# Patient Record
Sex: Male | Born: 1950
Health system: Southern US, Community
[De-identification: ages and names within clinical notes are randomized; demographics above are authoritative.]

## PROBLEM LIST (undated history)

## (undated) DIAGNOSIS — E1165 Type 2 diabetes mellitus with hyperglycemia: Secondary | ICD-10-CM

## (undated) DIAGNOSIS — R238 Other skin changes: Secondary | ICD-10-CM

## (undated) DIAGNOSIS — I10 Essential (primary) hypertension: Secondary | ICD-10-CM

## (undated) DIAGNOSIS — IMO0002 Reserved for concepts with insufficient information to code with codable children: Secondary | ICD-10-CM

## (undated) DIAGNOSIS — N189 Chronic kidney disease, unspecified: Secondary | ICD-10-CM

## (undated) DIAGNOSIS — B89 Unspecified parasitic disease: Secondary | ICD-10-CM

## (undated) DIAGNOSIS — F17210 Nicotine dependence, cigarettes, uncomplicated: Secondary | ICD-10-CM

## (undated) HISTORY — PX: NO PAST SURGERIES: SHX2092

---

## 2008-06-25 ENCOUNTER — Ambulatory Visit: Payer: Self-pay | Admitting: Internal Medicine

## 2008-06-30 ENCOUNTER — Ambulatory Visit (HOSPITAL_COMMUNITY): Admission: RE | Admit: 2008-06-30 | Discharge: 2008-06-30 | Payer: Self-pay | Admitting: Family Medicine

## 2008-06-30 ENCOUNTER — Ambulatory Visit: Payer: Self-pay | Admitting: *Deleted

## 2008-07-19 ENCOUNTER — Ambulatory Visit: Payer: Self-pay | Admitting: Family Medicine

## 2008-07-19 LAB — CONVERTED CEMR LAB
Alkaline Phosphatase: 144 units/L — ABNORMAL HIGH (ref 39–117)
BUN: 9 mg/dL (ref 6–23)
Chloride: 100 meq/L (ref 96–112)
Cholesterol: 208 mg/dL — ABNORMAL HIGH (ref 0–200)
Eosinophils Absolute: 0.2 10*3/uL (ref 0.0–0.7)
Glucose, Bld: 208 mg/dL — ABNORMAL HIGH (ref 70–99)
HDL: 50 mg/dL (ref >39)
LDL Cholesterol: 137 mg/dL — ABNORMAL HIGH (ref 0–99)
MCV: 88.1 fL (ref 78.0–100.0)
Monocytes Absolute: 0.5 10*3/uL (ref 0.1–1.0)
Neutro Abs: 4.9 10*3/uL (ref 1.7–7.7)
Neutrophils Relative %: 56 % (ref 43–77)
Platelets: 216 10*3/uL (ref 150–400)
RBC: 5.3 M/uL (ref 4.22–5.81)
Total Bilirubin: 0.6 mg/dL (ref 0.3–1.2)
Total CHOL/HDL Ratio: 4.2
Triglycerides: 103 mg/dL (ref <150)
VLDL: 21 mg/dL (ref 0–40)
WBC: 8.6 10*3/uL (ref 4.0–10.5)

## 2008-08-05 ENCOUNTER — Ambulatory Visit (HOSPITAL_COMMUNITY): Admission: RE | Admit: 2008-08-05 | Discharge: 2008-08-05 | Payer: Self-pay | Admitting: Family Medicine

## 2012-04-13 ENCOUNTER — Emergency Department (HOSPITAL_COMMUNITY): Payer: Self-pay

## 2012-04-13 ENCOUNTER — Encounter (HOSPITAL_COMMUNITY): Payer: Self-pay | Admitting: Emergency Medicine

## 2012-04-13 ENCOUNTER — Emergency Department (HOSPITAL_COMMUNITY)
Admission: EM | Admit: 2012-04-13 | Discharge: 2012-04-13 | Disposition: A | Discharge status: Home / Self Care | Payer: Self-pay | Attending: Emergency Medicine | Admitting: Emergency Medicine

## 2012-04-13 DIAGNOSIS — M549 Dorsalgia, unspecified: Secondary | ICD-10-CM

## 2012-04-13 DIAGNOSIS — M545 Low back pain, unspecified: Secondary | ICD-10-CM | POA: Insufficient documentation

## 2012-04-13 DIAGNOSIS — M546 Pain in thoracic spine: Secondary | ICD-10-CM | POA: Insufficient documentation

## 2012-04-13 DIAGNOSIS — F17200 Nicotine dependence, unspecified, uncomplicated: Secondary | ICD-10-CM | POA: Insufficient documentation

## 2012-04-13 MED ORDER — HYDROCODONE-ACETAMINOPHEN 5-325 MG PO TABS
1.0000 | ORAL_TABLET | Freq: Once | ORAL | Status: AC
  Administered 2012-04-13: 1 via ORAL
  Filled 2012-04-13: qty 1

## 2012-04-13 MED ORDER — NAPROXEN 500 MG PO TABS: 500.0000 mg | ORAL_TABLET | Freq: Two times a day (BID) | ORAL | Status: DC

## 2012-04-13 MED ORDER — HYDROCODONE-ACETAMINOPHEN 5-500 MG PO TABS: 1.0000 | ORAL_TABLET | Freq: Four times a day (QID) | ORAL | Status: AC | PRN

## 2012-04-13 NOTE — Discharge Instructions (Signed)
X-rays of the back are normal today. Try heating pads at home. Naprosyn for pain and inflammation. Vicodin for severe pain. Follow up with your doctor as scheduled or sooner if symptoms are worsening. Return if fever, weakness in legs, problems with urination or bowels.   Back Pain, Adult Low back pain is very common. About 1 in 5 people have back pain.The cause of low back pain is rarely dangerous. The pain often gets better over time.About half of people with a sudden onset of back pain feel better in just 2 weeks. About 8 in 10 people feel better by 6 weeks.  CAUSES Some common causes of back pain include:  Strain of the muscles or ligaments supporting the spine.   Wear and tear (degeneration) of the spinal discs.   Arthritis.   Direct injury to the back.  DIAGNOSIS Most of the time, the direct cause of low back pain is not known.However, back pain can be treated effectively even when the exact cause of the pain is unknown.Answering your caregiver's questions about your overall health and symptoms is one of the most accurate ways to make sure the cause of your pain is not dangerous. If your caregiver needs more information, he or she may order lab work or imaging tests (X-rays or MRIs).However, even if imaging tests show changes in your back, this usually does not require surgery. HOME CARE INSTRUCTIONS For many people, back pain returns.Since low back pain is rarely dangerous, it is often a condition that people can learn to [facility] their own.   Remain active. It is stressful on the back to sit or stand in one place. Do not sit, drive, or stand in one place for more than 30 minutes at a time. Take short walks on level surfaces as soon as pain allows.Try to increase the length of time you walk each day.   Do not stay in bed.Resting more than 1 or 2 days can delay your recovery.   Do not avoid exercise or work.Your body is made to move.It is not dangerous to be active, even  though your back may hurt.Your back will likely heal faster if you return to being active before your pain is gone.   Pay attention to your body when you bend and lift. Many people have less discomfortwhen lifting if they bend their knees, keep the load close to their bodies,and avoid twisting. Often, the most comfortable positions are those that put less stress on your recovering back.   Find a comfortable position to sleep. Use a firm mattress and lie on your side with your knees slightly bent. If you lie on your back, put a pillow under your knees.   Only take over-the-counter or prescription medicines as directed by your caregiver. Over-the-counter medicines to reduce pain and inflammation are often the most helpful.Your caregiver may prescribe muscle relaxant drugs.These medicines help dull your pain so you can more quickly return to your normal activities and healthy exercise.   Put ice on the injured area.   Put ice in a plastic bag.   Place a towel between your skin and the bag.   Leave the ice on for 15 to 20 minutes, 3 to 4 times a day for the first 2 to 3 days. After that, ice and heat may be alternated to reduce pain and spasms.   Ask your caregiver about trying back exercises and gentle massage. This may be of some benefit.   Avoid feeling anxious or stressed.Stress increases muscle  tension and can worsen back pain.It is important to recognize when you are anxious or stressed and learn ways to manage it.Exercise is a great option.  SEEK MEDICAL CARE IF:  You have pain that is not relieved with rest or medicine.   You have pain that does not improve in 1 week.   You have new symptoms.   You are generally not feeling well.  SEEK IMMEDIATE MEDICAL CARE IF:   You have pain that radiates from your back into your legs.   You develop new bowel or bladder control problems.   You have unusual weakness or numbness in your arms or legs.   You develop nausea or vomiting.    You develop abdominal pain.   You feel faint.  Document Released: 10/15/2005 Document Revised: 10/04/2011 Document Reviewed: 03/05/2011 [facility] Patient Information 2012 [employer].

## 2012-04-13 NOTE — ED Provider Notes (Signed)
History     CSN: [id number]  Arrival date & time 04/13/12  2118   First MD Initiated Contact with Patient 04/13/12 2220      Chief Complaint  Patient presents with  . Back Pain    (Consider location/radiation/quality/duration/timing/severity/associated sxs/prior treatment) Patient is a 61 y.o. male presenting with back pain. The history is provided by the patient and a relative.  Back Pain  This is a new problem. The current episode started more than 1 week ago. The problem occurs constantly. The problem has been gradually worsening. The pain is associated with no known injury. The pain is present in the thoracic spine and lumbar spine. The quality of the pain is described as aching. The pain does not radiate. The pain is at a severity of 8/10. The symptoms are aggravated by bending, twisting and certain positions. Pertinent negatives include no chest pain, no fever, no numbness, no abdominal pain, no abdominal swelling, no bowel incontinence, no perianal numbness, no bladder incontinence, no dysuria, no leg pain, no paresthesias, no tingling and no weakness.  pt with back pain for several weeks now. States not improving. Pt [place] which helped but medication ran out. Pt denies any injuries. No numbness or weakness in legs, no abdominal pain, no fevers, no urinary symptoms.   History reviewed. No pertinent past medical history.  History reviewed. No pertinent past surgical history.  No family history on file.  History  Substance Use Topics  . Smoking status: Current Everyday Smoker  . Smokeless tobacco: Not on file  . Alcohol Use: No      Review of Systems  Constitutional: Negative for fever and chills.  HENT: Negative for neck pain.   Respiratory: Negative.  Negative for chest tightness and shortness of breath.   Cardiovascular: Negative for chest pain.  Gastrointestinal: Negative for nausea, vomiting, abdominal pain and bowel  incontinence.  Genitourinary: Negative for bladder incontinence and dysuria.  Musculoskeletal: Positive for back pain. Negative for arthralgias and gait problem.  Skin: Negative.   Neurological: Negative for tingling, weakness, numbness and paresthesias.    Allergies  Review of patient's allergies indicates no known allergies.  Home Medications  No current outpatient prescriptions on file.  BP 142/78  Pulse 88  Temp 98.1 F (36.7 C) (Oral)  Resp 18  SpO2 99%  Physical Exam  Nursing note and vitals reviewed. Constitutional: He is oriented to person, place, and time. He appears well-developed and well-nourished. No distress.  HENT:  Head: Normocephalic.  Eyes: Conjunctivae are normal.  Neck: Neck supple.  Cardiovascular: Normal rate, regular rhythm and normal heart sounds.   Pulmonary/Chest: Effort normal and breath sounds normal. No respiratory distress. He has no wheezes. He has no rales.  Abdominal: Soft. Bowel sounds are normal. He exhibits no distension. There is no tenderness. There is no rebound.  Musculoskeletal:       Midline tenderness over thoracic and lumbar spine. No paravertebral tenderness. Pt has full ROM of forward flexion and extension. No pain with bilateral straight leg raise.   Neurological: He is alert and oriented to person, place, and time.       5/5 and equal upper and lower extremity strength. Patellar reflexes 2+ and equal bilaterally. Pt able to dorsiflex bilateral feet. Normal sensation of upper thighs, lower shins.   Skin: Skin is warm and dry.  Psychiatric: He has a normal mood and affect.    ED Course  Procedures (including critical care time)  Pt with back pain for several weeks. No abdominal pain, no fever, no iv drug use, no problems with bowels or urine, no pain, weakness, or numbness in exremities. No injury. Will get x-ray.   Dg Thoracic Spine 2 View  04/13/2012  *RADIOLOGY REPORT*  Clinical Data: Mid back pain for 1 week.  THORACIC  SPINE - 2 VIEW  Comparison: Chest radiograph performed 06/30/2008, and CT of the chest performed 08/05/2008  Findings: There is no evidence of fracture or subluxation. Vertebral bodies demonstrate normal height and alignment. Intervertebral disc spaces are preserved.  The visualized portions of both lungs are clear.  The mediastinum is unremarkable in appearance.  IMPRESSION: No evidence of fracture or subluxation along the thoracic spine.  Original Report Authenticated By: Santa Lighter, M.D.   Dg Lumbar Spine Complete  04/13/2012  *RADIOLOGY REPORT*  Clinical Data: Mid back pain for 1 week.  LUMBAR SPINE - COMPLETE 4+ VIEW  Comparison: None.  Findings: There is no evidence of fracture or subluxation. Vertebral bodies demonstrate normal height and alignment. Intervertebral disc spaces are preserved.  The visualized neural foramina are grossly unremarkable in appearance.  The visualized bowel gas pattern is unremarkable in appearance; air and stool are noted within the colon.  The sacroiliac joints are within normal limits.  Scattered vascular calcifications are seen.  IMPRESSION:  1.  No evidence of fracture or subluxation along the lumbar spine. 2.  Scattered vascular calcifications seen.  Original Report Authenticated By: Santa Lighter, M.D.   11:05 PM X-rays negative. Pt in NAD. Walking and movign without difficulty. No red flags. Will treat with pain medications, heating pads at home, follow up. Pt has an appointment in 9 days.    1. Back pain       MDM          Renold Genta, PA 04/14/12 0126

## 2012-04-13 NOTE — ED Notes (Addendum)
C/o lower back pain at night (when trying to sleep) x 2 weeks. Denies injury.  Denies pain at present.

## 2012-04-22 NOTE — ED Provider Notes (Signed)
Medical screening examination/treatment/procedure(s) were performed by non-physician practitioner and as supervising physician I was immediately available for consultation/collaboration.  Nat Christen, MD 04/22/12 [phone]

## 2012-06-25 ENCOUNTER — Emergency Department (HOSPITAL_COMMUNITY)
Admission: EM | Admit: 2012-06-25 | Discharge: 2012-06-25 | Disposition: A | Discharge status: Home / Self Care | Payer: Self-pay | Attending: Emergency Medicine | Admitting: Emergency Medicine

## 2012-06-25 ENCOUNTER — Encounter (HOSPITAL_COMMUNITY): Payer: Self-pay | Admitting: Emergency Medicine

## 2012-06-25 DIAGNOSIS — R197 Diarrhea, unspecified: Secondary | ICD-10-CM | POA: Insufficient documentation

## 2012-06-25 DIAGNOSIS — R739 Hyperglycemia, unspecified: Secondary | ICD-10-CM

## 2012-06-25 DIAGNOSIS — F17200 Nicotine dependence, unspecified, uncomplicated: Secondary | ICD-10-CM | POA: Insufficient documentation

## 2012-06-25 LAB — BASIC METABOLIC PANEL
BUN: 11 mg/dL (ref 6–23)
CO2: 27 mEq/L (ref 19–32)
CO2: 28 mEq/L (ref 19–32)
Calcium: 9.8 mg/dL (ref 8.4–10.5)
Calcium: 9.4 mg/dL (ref 8.4–10.5)
Creatinine, Ser: 0.44 mg/dL — ABNORMAL LOW (ref 0.50–1.35)
GFR calc Af Amer: >90 mL/min (ref >90)
GFR calc non Af Amer: >90 mL/min (ref >90)
GFR calc non Af Amer: >90 mL/min (ref >90)
Glucose, Bld: 700 mg/dL (ref 70–99)
Potassium: 3.4 mEq/L — ABNORMAL LOW (ref 3.5–5.1)
Sodium: 133 mEq/L — ABNORMAL LOW (ref 135–145)

## 2012-06-25 LAB — URINALYSIS, ROUTINE W REFLEX MICROSCOPIC
Bilirubin Urine: NEGATIVE
Hgb urine dipstick: NEGATIVE
Leukocytes, UA: NEGATIVE
Nitrite: NEGATIVE
Protein, ur: NEGATIVE mg/dL

## 2012-06-25 LAB — POCT I-STAT 3, VENOUS BLOOD GAS (G3P V)
Acid-Base Excess: 2 mmol/L (ref 0.0–2.0)
Bicarbonate: 29.5 mEq/L — ABNORMAL HIGH (ref 20.0–24.0)

## 2012-06-25 LAB — GLUCOSE, CAPILLARY: Glucose-Capillary: 296 mg/dL — ABNORMAL HIGH (ref 70–99)

## 2012-06-25 LAB — CBC WITH DIFFERENTIAL/PLATELET
Basophils Relative: 1 % (ref 0–1)
Eosinophils Absolute: 0.1 10*3/uL (ref 0.0–0.7)
Lymphs Abs: 2.5 10*3/uL (ref 0.7–4.0)
MCHC: 35.2 g/dL (ref 30.0–36.0)
Monocytes Absolute: 0.4 10*3/uL (ref 0.1–1.0)
Monocytes Relative: 6 % (ref 3–12)
Neutro Abs: 3 10*3/uL (ref 1.7–7.7)
Neutrophils Relative %: 50 % (ref 43–77)
Platelets: 167 10*3/uL (ref 150–400)

## 2012-06-25 LAB — HEPATIC FUNCTION PANEL
ALT: 39 U/L (ref 0–53)
Albumin: 3.9 g/dL (ref 3.5–5.2)

## 2012-06-25 MED ORDER — SODIUM CHLORIDE 0.9 % IV SOLN
INTRAVENOUS | Status: DC
  Administered 2012-06-25: 18:00:00 via INTRAVENOUS

## 2012-06-25 MED ORDER — METFORMIN HCL 500 MG PO TABS: 500.0000 mg | ORAL_TABLET | Freq: Two times a day (BID) | ORAL | Status: DC

## 2012-06-25 MED ORDER — SODIUM CHLORIDE 0.9 % IV BOLUS (SEPSIS)
1000.0000 mL | Freq: Once | INTRAVENOUS | Status: AC
  Administered 2012-06-25: 1000 mL via INTRAVENOUS

## 2012-06-25 NOTE — ED Notes (Signed)
MD at bedside. 

## 2012-06-25 NOTE — ED Notes (Signed)
Brian Owen [phone] (daughter)

## 2012-06-25 NOTE — ED Notes (Signed)
Pt c/o diarrhea x 1 month. Pt reports anything taken by mouth comes out in 5-10 mins. Pt also reports weight loss. Unknown amount.

## 2012-06-25 NOTE — ED Provider Notes (Signed)
History     CSN: [id number]  Arrival date & time 06/25/12  1304   First MD Initiated Contact with Patient 06/25/12 1552      Chief Complaint  Patient presents with  . Diarrhea    x 1 month    (Consider location/radiation/quality/duration/timing/severity/associated sxs/prior treatment) Patient is a 61 y.o. male presenting with diarrhea. The history is provided by the patient.  Diarrhea The primary symptoms include diarrhea.   patient here with diarrhea x1 month and increase weight loss. Symptoms are worse after he eats. Diarrhea has been on bloody or dark. Has been watery. Some abdominal crampy without vomiting but some nausea. No prior history of same. Denies any prior abdominal surgeries. No urinary symptoms. Nothing makes her symptoms better  History reviewed. No pertinent past medical history.  History reviewed. No pertinent past surgical history.  No family history on file.  History  Substance Use Topics  . Smoking status: Current Everyday Smoker  . Smokeless tobacco: Not on file  . Alcohol Use: No      Review of Systems  Gastrointestinal: Positive for diarrhea.  All other systems reviewed and are negative.    Allergies  Review of patient's allergies indicates no known allergies.  Home Medications  No current outpatient prescriptions on file.  BP 115/76  Pulse 92  Temp 98.6 F (37 C) (Oral)  Resp 18  SpO2 100%  Physical Exam  Nursing note and vitals reviewed. Constitutional: He is oriented to person, place, and time. He appears cachectic.  Non-toxic appearance. No distress.  HENT:  Head: Normocephalic and atraumatic.  Eyes: Conjunctivae, EOM and lids are normal. Pupils are equal, round, and reactive to light.  Neck: Normal range of motion. Neck supple. No tracheal deviation present. No mass present.  Cardiovascular: Normal rate, regular rhythm and normal heart sounds.  Exam reveals no gallop.   No murmur heard. Pulmonary/Chest: Effort normal and  breath sounds normal. No stridor. No respiratory distress. He has no decreased breath sounds. He has no wheezes. He has no rhonchi. He has no rales.  Abdominal: Soft. Normal appearance and bowel sounds are normal. He exhibits no distension. There is no tenderness. There is no rebound and no CVA tenderness.  Musculoskeletal: Normal range of motion. He exhibits no edema and no tenderness.  Neurological: He is alert and oriented to person, place, and time. He has normal strength. No cranial nerve deficit or sensory deficit. GCS eye subscore is 4. GCS verbal subscore is 5. GCS motor subscore is 6.  Skin: Skin is warm and dry. No abrasion and no rash noted.  Psychiatric: He has a normal mood and affect. His speech is normal and behavior is normal.    ED Course  Procedures (including critical care time)  Labs Reviewed  BASIC METABOLIC PANEL - Abnormal; Notable for the following:    Sodium 133 (*)     Potassium 3.4 (*)     Chloride 94 (*)     Glucose, Bld 700 (*)     All other components within normal limits  CBC WITH DIFFERENTIAL  URINALYSIS, ROUTINE W REFLEX MICROSCOPIC  LIPASE, BLOOD  HEPATIC FUNCTION PANEL  BLOOD GAS, VENOUS   No results found.   No diagnosis found.    MDM  Pt given iv fluids and blood sugar --suspect new onset diabetes--pt started on glucophage and given referall        Leota Jacobsen, MD 06/25/12 2128

## 2012-06-25 NOTE — ED Notes (Signed)
Family and pt wishing to speak to MD.  MD Antony Haste aware.

## 2012-06-25 NOTE — ED Notes (Signed)
Glucose 296

## 2013-02-18 DIAGNOSIS — R5381 Other malaise: Secondary | ICD-10-CM | POA: Diagnosis not present

## 2013-02-18 DIAGNOSIS — E119 Type 2 diabetes mellitus without complications: Secondary | ICD-10-CM | POA: Diagnosis not present

## 2013-02-18 DIAGNOSIS — Z13220 Encounter for screening for lipoid disorders: Secondary | ICD-10-CM | POA: Diagnosis not present

## 2013-02-18 DIAGNOSIS — M25559 Pain in unspecified hip: Secondary | ICD-10-CM | POA: Diagnosis not present

## 2013-02-18 DIAGNOSIS — R358 Other polyuria: Secondary | ICD-10-CM | POA: Diagnosis not present

## 2014-04-25 ENCOUNTER — Emergency Department (HOSPITAL_COMMUNITY)
Admission: EM | Admit: 2014-04-25 | Discharge: 2014-04-25 | Disposition: A | Discharge status: Home / Self Care | Payer: Medicare Other | Attending: Emergency Medicine | Admitting: Emergency Medicine

## 2014-04-25 ENCOUNTER — Encounter (HOSPITAL_COMMUNITY): Payer: Self-pay | Admitting: Emergency Medicine

## 2014-04-25 DIAGNOSIS — K529 Noninfective gastroenteritis and colitis, unspecified: Secondary | ICD-10-CM

## 2014-04-25 DIAGNOSIS — R21 Rash and other nonspecific skin eruption: Secondary | ICD-10-CM | POA: Insufficient documentation

## 2014-04-25 DIAGNOSIS — Z79899 Other long term (current) drug therapy: Secondary | ICD-10-CM | POA: Insufficient documentation

## 2014-04-25 DIAGNOSIS — F17200 Nicotine dependence, unspecified, uncomplicated: Secondary | ICD-10-CM | POA: Insufficient documentation

## 2014-04-25 DIAGNOSIS — E119 Type 2 diabetes mellitus without complications: Secondary | ICD-10-CM | POA: Diagnosis not present

## 2014-04-25 DIAGNOSIS — R197 Diarrhea, unspecified: Secondary | ICD-10-CM | POA: Insufficient documentation

## 2014-04-25 DIAGNOSIS — E1165 Type 2 diabetes mellitus with hyperglycemia: Secondary | ICD-10-CM

## 2014-04-25 LAB — CBC WITH DIFFERENTIAL/PLATELET
Basophils Absolute: 0 10*3/uL (ref 0.0–0.1)
Basophils Relative: 0 % (ref 0–1)
Eosinophils Absolute: 0.1 10*3/uL (ref 0.0–0.7)
Eosinophils Relative: 1 % (ref 0–5)
HCT: 38.7 % — ABNORMAL LOW (ref 39.0–52.0)
Hemoglobin: 13.5 g/dL (ref 13.0–17.0)
Lymphocytes Relative: 22 % (ref 12–46)
Lymphs Abs: 2 10*3/uL (ref 0.7–4.0)
MCH: 30.8 pg (ref 26.0–34.0)
MCHC: 34.9 g/dL (ref 30.0–36.0)
MCV: 88.2 fL (ref 78.0–100.0)
Monocytes Absolute: 0.6 10*3/uL (ref 0.1–1.0)
Monocytes Relative: 7 % (ref 3–12)
Neutro Abs: 6.3 10*3/uL (ref 1.7–7.7)
Neutrophils Relative %: 70 % (ref 43–77)
Platelets: 142 10*3/uL — ABNORMAL LOW (ref 150–400)
RBC: 4.39 MIL/uL (ref 4.22–5.81)
RDW: 12.8 % (ref 11.5–15.5)
WBC: 9 10*3/uL (ref 4.0–10.5)

## 2014-04-25 LAB — BASIC METABOLIC PANEL
BUN: 12 mg/dL (ref 6–23)
CO2: 26 mEq/L (ref 19–32)
Calcium: 9.1 mg/dL (ref 8.4–10.5)
Chloride: 92 mEq/L — ABNORMAL LOW (ref 96–112)
Creatinine, Ser: 0.62 mg/dL (ref 0.50–1.35)
GFR calc Af Amer: >90 mL/min (ref >90)
GFR calc non Af Amer: >90 mL/min (ref >90)
Glucose, Bld: 540 mg/dL — ABNORMAL HIGH (ref 70–99)
Potassium: 3.9 mEq/L (ref 3.7–5.3)
Sodium: 133 mEq/L — ABNORMAL LOW (ref 137–147)

## 2014-04-25 LAB — CBG MONITORING, ED: Glucose-Capillary: 351 mg/dL — ABNORMAL HIGH (ref 70–99)

## 2014-04-25 MED ORDER — METFORMIN HCL 850 MG PO TABS: 850.0000 mg | ORAL_TABLET | Freq: Two times a day (BID) | ORAL | Status: DC

## 2014-04-25 MED ORDER — SODIUM CHLORIDE 0.9 % IV BOLUS (SEPSIS)
1000.0000 mL | Freq: Once | INTRAVENOUS | Status: AC
  Administered 2014-04-25: 1000 mL via INTRAVENOUS

## 2014-04-25 NOTE — ED Notes (Signed)
Brian Owen in main lab, states HgbA1c will be added on.

## 2014-04-25 NOTE — Discharge Instructions (Signed)
Chronic Diarrhea Diarrhea is frequent loose and watery bowel movements. It can cause you to feel weak and dehydrated. Dehydration can cause you to become tired and thirsty and to have a dry mouth, decreased urination, and dark yellow urine. Diarrhea is a sign of another problem, most often an infection that will not last long. In most cases, diarrhea lasts 2-3 days. Diarrhea that lasts longer than 4 weeks is called long-lasting (chronic) diarrhea. It is important to treat your diarrhea as directed by your health care provider to lessen or prevent future episodes of diarrhea.  CAUSES  There are many causes of chronic diarrhea. The following are some possible causes:   Gastrointestinal infections caused by viruses, bacteria, or parasites.   Food poisoning or food allergies.   Certain medicines, such as antibiotics, chemotherapy, and laxatives.   Artificial sweeteners and fructose.   Digestive disorders, such as celiac disease and inflammatory bowel diseases.   Irritable bowel syndrome.  Some disorders of the pancreas.  Disorders of the thyroid.  Reduced blood flow to the intestines.  Cancer. Sometimes the cause of chronic diarrhea is unknown. RISK FACTORS  Having a severely weakened immune system, such as from HIV or AIDS.   Taking certain types of cancer-fighting drugs (such as with chemotherapy) or other medicines.   Having had a recent organ transplant.   Having a portion of the stomach or small bowel removed.   Traveling to countries where food and water supplies are often contaminated.  SYMPTOMS  In addition to frequent, loose stools, diarrhea may cause:   Cramping.   Abdominal pain.   Nausea.   Fever.  Fatigue.  Urgent need to use the bathroom.  Loss of bowel control. DIAGNOSIS  Your health care provider must take a careful history and perform a physical exam. Tests given are based on your symptoms and history. Tests may include:   Blood or  stool tests. Three or more stool samples may be examined. Stool cultures may be used to test for bacteria or parasites.   X-rays.   A procedure in which a thin tube is inserted into the mouth or rectum (endoscopy). This allows the health care provider to look inside the intestine.  TREATMENT   Treatment is aimed at correcting the cause of the diarrhea when possible.  Diarrhea caused by an infection can often be treated with antibiotic medicines.  Diarrhea not caused by an infection may require you to take long-term medicine or have surgery. Specific treatment should be discussed with your health care provider.  If the cause cannot be determined, treatment aims to relieve symptoms and prevent dehydration. Serious health problems can occur if you do not maintain proper fluid levels. Treatment may include:  Taking an oral rehydration solution (ORS).  Not drinking beverages that contain caffeine (such as tea, coffee, and soft drinks).  Not drinking alcohol.  Maintaining well-balanced nutrition to help you recover faster. HOME CARE INSTRUCTIONS   Drink enough fluids to keep urine clear or pale yellow. Drink 1 cup (8 oz) of fluid for each diarrhea episode. Avoid fluids that contain simple sugars, fruit juices, whole milk products, and sodas. Hydrate with an ORS. You may purchase the ORS or prepare it at home by mixing the following ingredients together:   - tsp (1.7-3  mL) table salt.   tsp (3  mL) baking soda.   tsp (1.7 mL) salt substitute containing potassium chloride.  1 tbsp (20 mL) sugar.  4.2 c (1 L) of water.  Certain foods and beverages may increase the speed at which food moves through the gastrointestinal (GI) tract. These foods and beverages should be avoided. They include:  Caffeinated and alcoholic beverages.  High-fiber foods, such as raw fruits and vegetables, nuts, seeds, and whole grain breads and cereals.  Foods and beverages sweetened with sugar  alcohols, such as xylitol, sorbitol, and mannitol.   Some foods may be well tolerated and may help thicken stool. These include:  Starchy foods, such as rice, toast, pasta, low-sugar cereal, oatmeal, grits, baked potatoes, crackers, and bagels.  Bananas.  Applesauce.  Add probiotic-rich foods to help increase healthy bacteria in the GI tract. These include yogurt and fermented milk products.  Wash your hands well after each diarrhea episode.  Only take over-the-counter or prescription medicines as directed by your health care provider.  Take a warm bath to relieve any burning or pain from frequent diarrhea episodes. SEEK MEDICAL CARE IF:   You are not urinating as often.  Your urine is a dark color.  You become very tired or dizzy.  You have severe pain in the abdomen or rectum.  Your have blood or pus in your stools.  Your stools look black and tarry. SEEK IMMEDIATE MEDICAL CARE IF:   You are unable to keep fluids down.  You have persistent vomiting.  You have blood in your stool.  Your stools are black and tarry.  You do not urinate in 6-8 hours, or there is only a small amount of very dark urine.  You have abdominal pain that increases or localizes.  You have weakness, dizziness, confusion, or lightheadedness.  You have a severe headache.  Your diarrhea gets worse or does not get better.  You have a fever or persistent symptoms for more than 2-3 days.  You have a fever and your symptoms suddenly get worse. MAKE SURE YOU:   Understand these instructions.  Will watch your condition.  Will get help right away if you are not doing well or get worse. Document Released: 01/05/2004 Document Revised: 10/20/2013 Document Reviewed: 04/09/2013 [facility] Patient Information 2015 [place], [place]. This information is not intended to replace advice given to you by your health care provider. Make sure you discuss any questions you have with your health care  provider.  Diabetes and Standards of Medical Care  Diabetes is complicated. You may find that your diabetes team includes a dietitian, nurse, diabetes educator, eye doctor, and more. To help everyone know what is going on and to help you get the care you deserve, the following schedule of care was developed to help keep you on track. Below are the tests, exams, vaccines, medicines, education, and plans you will need. HbA1c test This test shows how well you have controlled your glucose over the past 2-3 months. It is used to see if your diabetes management plan needs to be adjusted.   It is performed at least 2 times a year if you are meeting treatment goals.  It is performed 4 times a year if therapy has changed or if you are not meeting treatment goals. Blood pressure test  This test is performed at every routine medical visit. The goal is less than 140/90 mmHg for most people, but 130/80 mmHg in some cases. Ask your health care provider about your goal. Dental exam  Follow up with the dentist regularly. Eye exam  If you are diagnosed with type 1 diabetes as a child, get an exam upon reaching the age of 40 years  or older and have had diabetes for 3-5 years. Yearly eye exams are recommended after that initial eye exam.  If you are diagnosed with type 1 diabetes as an adult, get an exam within 5 years of diagnosis and then yearly.  If you are diagnosed with type 2 diabetes, get an exam as soon as possible after the diagnosis and then yearly. Foot care exam  Visual foot exams are performed at every routine medical visit. The exams check for cuts, injuries, or other problems with the feet.  A comprehensive foot exam should be done yearly. This includes visual inspection as well as assessing foot pulses and testing for loss of sensation.  Check your feet nightly for cuts, injuries, or other problems with your feet. Tell your health care provider if anything is not healing. Kidney function  test (urine microalbumin)  This test is performed once a year.  Type 1 diabetes: The first test is performed 5 years after diagnosis.  Type 2 diabetes: The first test is performed at the time of diagnosis.  A serum creatinine and estimated glomerular filtration rate (eGFR) test is done once a year to assess the level of chronic kidney disease (CKD), if present. Lipid profile (cholesterol, HDL, LDL, triglycerides)  Performed every 5 years for most people.  The goal for LDL is less than 100 mg/dL. If you are at high risk, the goal is less than 70 mg/dL.  The goal for HDL is 40 mg/dL-50 mg/dL for men and 50 mg/dL-60 mg/dL for women. An HDL cholesterol of 60 mg/dL or higher gives some protection against heart disease.  The goal for triglycerides is less than 150 mg/dL. Influenza vaccine, pneumococcal vaccine, and hepatitis B vaccine  The influenza vaccine is recommended yearly.  The pneumococcal vaccine is generally given once in a lifetime. However, there are some instances when another vaccination is recommended. Check with your health care provider.  The hepatitis B vaccine is also recommended for adults with diabetes. Diabetes self-management education  Education is recommended at diagnosis and ongoing as needed. Treatment plan  Your treatment plan is reviewed at every medical visit. Document Released: 08/12/2009 Document Revised: 06/17/2013 Document Reviewed: 03/17/2013 [facility] Patient Information 2015 [place], [place]. This information is not intended to replace advice given to you by your health care provider. Make sure you discuss any questions you have with your health care provider.   Emergency Department Resource Guide 1) Find a Doctor and Pay Out of Pocket Although you won't have to find out who is covered by your insurance plan, it is a good idea to ask around and get recommendations. You will then need to call the office and see if the doctor you have chosen will accept  you as a new patient and what types of options they offer for patients who are self-pay. Some doctors offer discounts or will set up payment plans for their patients who do not have insurance, but you will need to ask so you aren't surprised when you get to your appointment.  2) Contact Your Local Health Department Not all health departments have doctors that can see patients for sick visits, but many do, so it is worth a call to see if yours does. If you don't know where your local health department is, you can check in your phone book. The CDC also has a tool to help you locate your state's health department, and many state websites also have listings of all of their local health departments.  3) Find  a [facility] If your illness is not likely to be very severe or complicated, you may want to try a walk in clinic. These are popping up all over the country in pharmacies, drugstores, and shopping centers. They're usually staffed by nurse practitioners or physician assistants that have been trained to treat common illnesses and complaints. They're usually fairly quick and inexpensive. However, if you have serious medical issues or chronic medical problems, these are probably not your best option.  No Primary Care Doctor: - Call Health Connect at  [phone] - they can help you locate a primary care doctor that  accepts your insurance, provides certain services, etc. - Physician Referral Service- [phone]  Chronic Pain Problems: Organization         Address  Phone   Notes  [facility]  [phone] Patients need to be referred by their primary care doctor.   Medication Assistance: Organization         Address  Phone   Notes  [facility] [address] [place], [place] [address] [phone] --Must be a resident of [facility] -- Must have NO insurance coverage whatsoever (no Medicaid/ Medicare, etc.) -- The pt. MUST  have a primary care doctor that directs their care regularly and follows them in the community   MedAssist  [phone]   [employer]  [phone]    Agencies that provide inexpensive medical care: Organization         Address  Phone   Notes  [place]  [phone]   [facility]    [phone]   [facility] [place], [place] [address] [phone]   [place] [address] [place] [phone]   Planned Parenthood    [phone]   [facility]    [phone]   [place] and [address] [address], [place] Phone:  [phone], Fax:  [phone] Hours of Operation:  9 am - 6 pm, M-F.  Also accepts Medicaid/Medicare and self-pay.  [facility] for [place] [address], [address], [place] Phone: [phone], Fax: [phone]. Hours of Operation:  8:30 am - 5:30 pm, M-F.  Also accepts Medicaid and self-pay.  [facility] [place] [address], [place] Phone: [phone]   [place], [place], [place] [phone], Ext. [phone] Mondays & Thursdays: 7-9 AM.  First 15 patients are seen on a first come, first serve basis.    [place] Providers:  Organization         Address  Phone   Notes  [facility] [address], [place] [phone] Also accepts self-pay patients.  [facility] [address], [place]  [phone]   [place], [address], [place] [phone]   [facility] [address] [place] [phone]   Lucianne Lei [address], [place]   [phone] Only accepts [place] Access [place] patients after they have their name applied to their card.   Self-Pay (no insurance) in [facility]:  Organization         Address  Phone   Notes  Sickle Cell Patients, [facility] [place]  [place], [place] [phone]   [facility] [address] [place], [place] [phone]   [facility] [place]  [address], [place] [phone]   [facility]/Dr. Osei-Bonsu  [address], [place] or [address], [place] [phone] Phone number for both [place] and [place] locations is the same.  [facility] [address] [place] [phone]   [facility] [address], [place] or [address] [phone]   [facility] [address], [place] [phone], phone; [phone], fax Sees patients 1st and 3rd Saturday of every month.  Must not qualify for public or private insurance (i.e. Medicaid, Medicare, [place] Health Choice, Veterans' Benefits)  Household income should be no more than 200% of the poverty level The clinic cannot treat you if you are pregnant or think you are pregnant  Sexually transmitted diseases are not treated at the clinic.    Dental Care: Organization         Address  Phone  Notes  [facility] Department of [facility] [address] [place], [place] [phone] Accepts children up to age 63 who are enrolled in [place] or [place] Health Choice; pregnant women with a Medicaid card; and children who have applied for Medicaid or [place] Health Choice, but were declined, whose parents can pay a reduced fee at time of service.  [facility] Department of [facility]  [address], [place] [phone] Accepts children up to age 31 who are enrolled in [place] or [place] Health Choice; pregnant women with a Medicaid card; and children who have applied for Medicaid or [place] Health Choice, but were declined, whose parents can  pay a reduced fee at time of service.  [facility]  [address] [place], [place] [phone] Patients are seen by appointment only. Walk-ins are not accepted. [facility] will see patients 66 years of age and older. Monday - Tuesday (8am-5pm) Most Wednesdays (8:30-5pm) $30 per visit, cash only  [facility] Adult [facility]  [address], [facility] [phone] Patients are seen by appointment only. Walk-ins are not accepted. [facility] will see patients 89 years of age and older. One Wednesday Evening (Monthly: Volunteer Based).  $30 per visit, cash only  [employer]  [phone] for adults; Children under age 69, call Graduate Pediatric Dentistry at [phone]. Children aged 44-14, please call [phone] to request a pediatric application.  Dental services are provided in all areas of dental care including fillings, crowns and bridges, complete and partial dentures, implants, gum treatment, root canals, and extractions. Preventive care is also provided. Treatment is provided to both adults and children. Patients are selected via a lottery and there is often a waiting list.   [facility] [address] [place]  [phone] [email or web address]   [facility] [address], [place] [phone], Ext. [phone] Second and Fourth Thursday of each month, opens at 6:30 AM; Clinic ends at 9 AM.  Patients are seen on a first-come first-served basis, and a limited number are seen during each clinic.   [facility] [facility]  [address] [place], [place] [phone]   Eligibility Requirements You must have lived in [place], [place], or [place]  counties for at least the last three months.   You cannot be eligible for state or federal sponsored [employer], including [employer], [place], or [employer].   You generally cannot be eligible for healthcare  insurance through your employer.    How to apply: Eligibility screenings are held every Tuesday and Wednesday afternoon from 1:00 pm until 4:00 pm. You do not need an appointment for the interview!  [facility] [address] [place], [place]   [place]  [place] Department  [place]  [phone]    Behavioral Health Resources in the Community: Intensive Outpatient Programs Organization         Address  Phone  Notes  Shiloh Lyons. [address], [place], [place] [phone]   [facility] [address] [place], [place]   ADS: [facility] Drug [facility] [address] [place], [place]   [place] [address]  [place], [place] or [phone]   Substance Abuse Resources Organization         Address  Phone  Notes  Al[facility] Drug Services  [phone]   [place]  [phone]   [facility] [place]   Chinita Pester  [phone]   Residential & Outpatient Substance Abuse Program  [phone]   Psychological Services Organization         Address  Phone  Notes  [facility] [place]  [place]  [phone]   [place] [address] [place] or [phone]    Mobile Crisis Teams Organization         Address  Phone  Notes  [facility], Mobile Crisis Care Unit  [phone]   [facility]  [address] [place], [place]   [place] Levels [address] [place] [phone]    Self-Help/Support Groups Organization         Address  Phone             Notes  [place]. of [place] - variety of support groups  [place] Call for more information  Narcotics Anonymous (NA),  [facility] [address], [employer] [place]  2 meetings at this location   Special educational needs teacher         Address  Phone  Notes  [facility] Residential Treatment [place],    [place]  [phone]   [facility]  [address], [place] [phone], [place], [place]   [place], [place] [phone] Admissions: 8am-3pm M-F  Incentives Substance [place] [address],    [place], [place] [phone]   [facility] [address] [place], [place], [place]   [facility] [address]  [place], [place]   [facility] - Intensive Outpatient [address] Krist[address], [place], [place]   [facility] [place].,  [place], [place] or [phone]   [facility] (RT[facility]) [address], [place], [place] Accepts Medicaid  [facility] [place] [address]  [place] [phone] Substance Abuse/Addiction Treatment   [facility] Resources Organization         Address  Phone  Notes  Lexicographer Services  (  [phone]   Domenic Schwab, PhD [address] [place], [place]   [phone] or [phone]   [place], [place] [phone]   [address], [place], [place] [phone] Insurance/Medicaid/sponsorship through [facility] [address]                                    [place], [place] [phone] [place] [address], [place] [phone]    Dr. Adele Schilder  [phone]   [facility] Dept. 1) [address], [place] 2) [place] 3)  [address] [place] [address], [place] [phone]  [phone]    [place] [phone] or [phone] (After Hours)

## 2014-04-25 NOTE — ED Notes (Signed)
Pt informed of need of stool sample, states he does not have to use the restroom at this time. Pt will notify RN when restroom needed.

## 2014-04-25 NOTE — ED Notes (Signed)
Pt and family reports pt has rash to back and left side of neck/face x 2 weeks, its not healing and now draining. Started having fever/chills, fatigue and diarrhea.

## 2014-04-25 NOTE — ED Provider Notes (Signed)
CSN: [id number]     Arrival date & time 04/25/14  1124 History   First MD Initiated Contact with Patient 04/25/14 1206     Chief Complaint  Patient presents with  . Rash  . Diarrhea     (Consider location/radiation/quality/duration/timing/severity/associated sxs/prior Treatment) HPI  63yM with diarrhea. Chronic in nature. Has been going on for years. Family wanted him checked out again. No PCP. Has very loose stools any time her eats. No blood or melena. No chronic med use. From [place]. Has lived in [place] for 32 years. No recent travel hx. Also, rash to R upper back. Onset about 2 weeks ago. Very itchy. No new exposures that he is aware of.   Past Medical History  Diagnosis Date  . Diabetes mellitus without complication    History reviewed. No pertinent past surgical history. History reviewed. No pertinent family history. History  Substance Use Topics  . Smoking status: Current Every Day Smoker  . Smokeless tobacco: Not on file  . Alcohol Use: No    Review of Systems  All systems reviewed and negative, other than as noted in HPI.   Allergies  Review of patient's allergies indicates no known allergies.  Home Medications   Prior to Admission medications   Medication Sig Start Date End Date Taking? Authorizing Provider  acetaminophen (TYLENOL) 500 MG tablet Take 1,000 mg by mouth daily as needed for mild pain.   Yes Historical Provider, MD   BP 132/71  Pulse 80  Temp(Src) 97.8 F (36.6 C) (Oral)  Resp 20  SpO2 98% Physical Exam  Nursing note and vitals reviewed. Constitutional: No distress.  Laying in bed. NAD. Cachectic.   HENT:  Head: Normocephalic and atraumatic.  Eyes: Conjunctivae are normal. Right eye exhibits no discharge. Left eye exhibits no discharge.  Neck: Neck supple.  Cardiovascular: Normal rate, regular rhythm and normal heart sounds.  Exam reveals no gallop and no friction rub.   No murmur heard. Pulmonary/Chest: Effort normal and breath sounds  normal. No respiratory distress.  Abdominal: Soft. He exhibits no distension. There is no tenderness.  Musculoskeletal: He exhibits no edema and no tenderness.  Neurological: He is alert.  Skin: Skin is warm and dry. Rash noted.     Subacute appearing rash with some excoriation in depicted area. No drainage. No cellulitis.   Psychiatric: He has a normal mood and affect. His behavior is normal. Thought content normal.    ED Course  Procedures (including critical care time) Labs Review Labs Reviewed  BASIC METABOLIC PANEL - Abnormal; Notable for the following:    Sodium 133 (*)    Chloride 92 (*)    Glucose, Bld 540 (*)    All other components within normal limits  CBC WITH DIFFERENTIAL - Abnormal; Notable for the following:    HCT 38.7 (*)    Platelets 142 (*)    All other components within normal limits  GI PATHOGEN PANEL BY PCR, STOOL    Imaging Review No results found.   EKG Interpretation None      MDM   Final diagnoses:  Poorly controlled diabetes mellitus  Chronic diarrhea  Rash and nonspecific skin eruption    62 year old male with multiple complaints. Nonspecific rash to his upper back. We'll treat this symptomatically in terms of his itching. His diarrhea is chronic in nature. Afebrile. No blood in the stool. No significant weight loss. Plan as needed antidiarrheals. Workup significant for hyperglycemia. This was noted on previous ED work-up. Is not currently  on any medications for diabetes. He has not followed up or established primary care since that evaluation. Renal function ok. Will start pt on metformin. Stressed several times to pt and his family the need to establish primary care. Resource list provided.     Virgel Manifold, MD 04/28/14 [phone]

## 2014-04-26 LAB — HEMOGLOBIN A1C
Hgb A1c MFr Bld: 16 % — ABNORMAL HIGH (ref <5.7)
Mean Plasma Glucose: 413 mg/dL — ABNORMAL HIGH (ref <117)

## 2014-04-27 ENCOUNTER — Encounter (HOSPITAL_COMMUNITY): Payer: Self-pay | Admitting: Emergency Medicine

## 2014-04-27 ENCOUNTER — Emergency Department (HOSPITAL_COMMUNITY)
Admission: EM | Admit: 2014-04-27 | Discharge: 2014-04-27 | Disposition: A | Discharge status: Home / Self Care | Payer: Medicare Other | Attending: Emergency Medicine | Admitting: Emergency Medicine

## 2014-04-27 DIAGNOSIS — R197 Diarrhea, unspecified: Secondary | ICD-10-CM | POA: Diagnosis not present

## 2014-04-27 DIAGNOSIS — K529 Noninfective gastroenteritis and colitis, unspecified: Secondary | ICD-10-CM

## 2014-04-27 DIAGNOSIS — Z79899 Other long term (current) drug therapy: Secondary | ICD-10-CM | POA: Insufficient documentation

## 2014-04-27 DIAGNOSIS — F17200 Nicotine dependence, unspecified, uncomplicated: Secondary | ICD-10-CM | POA: Insufficient documentation

## 2014-04-27 DIAGNOSIS — E119 Type 2 diabetes mellitus without complications: Secondary | ICD-10-CM | POA: Diagnosis not present

## 2014-04-27 DIAGNOSIS — R739 Hyperglycemia, unspecified: Secondary | ICD-10-CM

## 2014-04-27 LAB — COMPREHENSIVE METABOLIC PANEL
ALBUMIN: 3.1 g/dL — AB (ref 3.5–5.2)
ALT: 16 U/L (ref 0–53)
AST: 13 U/L (ref 0–37)
Alkaline Phosphatase: 154 U/L — ABNORMAL HIGH (ref 39–117)
BILIRUBIN TOTAL: 0.4 mg/dL (ref 0.3–1.2)
BUN: 12 mg/dL (ref 6–23)
CHLORIDE: 93 meq/L — AB (ref 96–112)
CO2: 25 mEq/L (ref 19–32)
CREATININE: 0.74 mg/dL (ref 0.50–1.35)
Calcium: 9.5 mg/dL (ref 8.4–10.5)
GFR calc Af Amer: >90 mL/min (ref >90)
GFR calc non Af Amer: >90 mL/min (ref >90)
Glucose, Bld: 469 mg/dL — ABNORMAL HIGH (ref 70–99)
Potassium: 4.3 mEq/L (ref 3.7–5.3)
SODIUM: 133 meq/L — AB (ref 137–147)
Total Protein: 7.4 g/dL (ref 6.0–8.3)

## 2014-04-27 LAB — URINALYSIS, ROUTINE W REFLEX MICROSCOPIC
Bilirubin Urine: NEGATIVE
Hgb urine dipstick: NEGATIVE
KETONES UR: 15 mg/dL — AB
Leukocytes, UA: NEGATIVE
Nitrite: NEGATIVE
PH: 6.5 (ref 5.0–8.0)
Protein, ur: NEGATIVE mg/dL
Specific Gravity, Urine: 1.02 (ref 1.005–1.030)
Urobilinogen, UA: 0.2 mg/dL (ref 0.0–1.0)

## 2014-04-27 LAB — CBC WITH DIFFERENTIAL/PLATELET
BASOS ABS: 0 10*3/uL (ref 0.0–0.1)
BASOS PCT: 0 % (ref 0–1)
Eosinophils Absolute: 0.1 10*3/uL (ref 0.0–0.7)
Eosinophils Relative: 1 % (ref 0–5)
HCT: 36.3 % — ABNORMAL LOW (ref 39.0–52.0)
Hemoglobin: 12.3 g/dL — ABNORMAL LOW (ref 13.0–17.0)
Lymphocytes Relative: 21 % (ref 12–46)
Lymphs Abs: 1.8 10*3/uL (ref 0.7–4.0)
MCH: 30.3 pg (ref 26.0–34.0)
MCHC: 33.9 g/dL (ref 30.0–36.0)
MCV: 89.4 fL (ref 78.0–100.0)
MONO ABS: 0.6 10*3/uL (ref 0.1–1.0)
Monocytes Relative: 8 % (ref 3–12)
Neutro Abs: 5.8 10*3/uL (ref 1.7–7.7)
Neutrophils Relative %: 70 % (ref 43–77)
PLATELETS: 163 10*3/uL (ref 150–400)
RBC: 4.06 MIL/uL — ABNORMAL LOW (ref 4.22–5.81)
RDW: 12.9 % (ref 11.5–15.5)
WBC: 8.3 10*3/uL (ref 4.0–10.5)

## 2014-04-27 LAB — I-STAT CHEM 8, ED
BUN: 8 mg/dL (ref 6–23)
Calcium, Ion: 1.12 mmol/L — ABNORMAL LOW (ref 1.13–1.30)
Chloride: 98 mEq/L (ref 96–112)
Creatinine, Ser: 0.6 mg/dL (ref 0.50–1.35)
Glucose, Bld: 270 mg/dL — ABNORMAL HIGH (ref 70–99)
HCT: 42 % (ref 39.0–52.0)
Hemoglobin: 14.3 g/dL (ref 13.0–17.0)
Potassium: 4.4 mEq/L (ref 3.7–5.3)
Sodium: 135 mEq/L — ABNORMAL LOW (ref 137–147)
TCO2: 27 mmol/L (ref 0–100)

## 2014-04-27 LAB — URINE MICROSCOPIC-ADD ON

## 2014-04-27 LAB — LIPASE, BLOOD: Lipase: 35 U/L (ref 11–59)

## 2014-04-27 MED ORDER — SODIUM CHLORIDE 0.9 % IV BOLUS (SEPSIS)
2000.0000 mL | Freq: Once | INTRAVENOUS | Status: AC
  Administered 2014-04-27: 2000 mL via INTRAVENOUS

## 2014-04-27 MED ORDER — LOPERAMIDE HCL 2 MG PO CAPS
2.0000 mg | ORAL_CAPSULE | ORAL | Status: DC | PRN
  Administered 2014-04-27: 2 mg via ORAL
  Filled 2014-04-27: qty 1

## 2014-04-27 MED ORDER — THIAMINE HCL 100 MG/ML IJ SOLN
100.0000 mg | Freq: Once | INTRAMUSCULAR | Status: AC
  Administered 2014-04-27: 100 mg via INTRAVENOUS
  Filled 2014-04-27: qty 2

## 2014-04-27 MED ORDER — LOPERAMIDE HCL 2 MG PO CAPS: 2.0000 mg | ORAL_CAPSULE | Freq: Four times a day (QID) | ORAL | Status: DC | PRN

## 2014-04-27 NOTE — ED Provider Notes (Signed)
CSN: [id number]     Arrival date & time 04/27/14  1415 History   First MD Initiated Contact with Patient 04/27/14 1612     Chief Complaint  Patient presents with  . Diarrhea     (Consider location/radiation/quality/duration/timing/severity/associated sxs/prior Treatment) HPI Pt is a 63yo male with hx of diabetes w/o complication presenting to ED with c/o chronic watery diarrhea x3-4 months with associated generalized weakness.  Family states pt cannot even walk around house or lift his arms due to weakness. Pt reports 3-4 episodes of watery diarrhea per day. No blood or mucous in stool. Pt was Rx metformin from ED when pt was evaluated for same. Pt strongly encouraged to establish PCP care however family state they have not been able to find a PCP yet.  Denies fever, n/v/d. Denies abdominal pain. Denies recent antibiotic use or recent travel.  Denies chest pain or SOB. Denies urinary symptoms.   Past Medical History  Diagnosis Date  . Diabetes mellitus without complication    History reviewed. No pertinent past surgical history. History reviewed. No pertinent family history. History  Substance Use Topics  . Smoking status: Current Every Day Smoker    Types: Cigarettes  . Smokeless tobacco: Not on file  . Alcohol Use: No    Review of Systems  Constitutional: Negative for fever and chills.  Respiratory: Negative for cough, shortness of breath and stridor.   Cardiovascular: Negative for chest pain and palpitations.  Gastrointestinal: Positive for diarrhea. Negative for nausea, vomiting, abdominal pain, constipation and blood in stool.  Genitourinary: Negative for dysuria, frequency and hematuria.  Neurological: Positive for weakness. Negative for dizziness, syncope, light-headedness and numbness.  All other systems reviewed and are negative.     Allergies  Review of patient's allergies indicates no known allergies.  Home Medications   Prior to Admission medications    Medication Sig Start Date End Date Taking? Authorizing Provider  acetaminophen (TYLENOL) 500 MG tablet Take 1,000 mg by mouth daily as needed for mild pain.   Yes Historical Provider, MD  metFORMIN (GLUCOPHAGE) 850 MG tablet Take 850 mg by mouth 2 (two) times daily with a meal. 04/25/14  Yes Virgel Manifold, MD  loperamide (IMODIUM) 2 MG capsule Take 1 capsule (2 mg total) by mouth 4 (four) times daily as needed for diarrhea or loose stools. 04/27/14   Noland Fordyce, PA-C   BP 173/91  Pulse 89  Temp(Src) 99.3 F (37.4 C) (Oral)  Resp 24  SpO2 100% Physical Exam  Nursing note and vitals reviewed. Constitutional: He appears well-developed and well-nourished.  Thin elderly male lying in exam bed, NAD.   HENT:  Head: Normocephalic and atraumatic.  Eyes: Conjunctivae are normal. No scleral icterus.  Neck: Normal range of motion.  Cardiovascular: Normal rate, regular rhythm and normal heart sounds.   Pulmonary/Chest: Effort normal and breath sounds normal. No respiratory distress. He has no wheezes. He has no rales. He exhibits no tenderness.  Abdominal: Soft. Bowel sounds are normal. He exhibits no distension and no mass. There is no tenderness. There is no rebound and no guarding.  Soft, non-distended, non-tender.  Musculoskeletal: Normal range of motion.  Neurological: He is alert.  Skin: Skin is warm and dry.    ED Course  Procedures (including critical care time) Labs Review Labs Reviewed  CBC WITH DIFFERENTIAL - Abnormal; Notable for the following:    RBC 4.06 (*)    Hemoglobin 12.3 (*)    HCT 36.3 (*)    All other  components within normal limits  COMPREHENSIVE METABOLIC PANEL - Abnormal; Notable for the following:    Sodium 133 (*)    Chloride 93 (*)    Glucose, Bld 469 (*)    Albumin 3.1 (*)    Alkaline Phosphatase 154 (*)    All other components within normal limits  URINALYSIS, ROUTINE W REFLEX MICROSCOPIC - Abnormal; Notable for the following:    Glucose, UA >1000 (*)     Ketones, ur 15 (*)    All other components within normal limits  I-STAT CHEM 8, ED - Abnormal; Notable for the following:    Sodium 135 (*)    Glucose, Bld 270 (*)    Calcium, Ion 1.12 (*)    All other components within normal limits  LIPASE, BLOOD  URINE MICROSCOPIC-ADD ON    Imaging Review No results found.   EKG Interpretation None      MDM   Final diagnoses:  Chronic diarrhea  Hyperglycemia    Pt is a 63yo male with hx of uncontrolled diabetes and chronic diarrhea c/o generalized weakness and fatigue.  Pt does not have PCP. Pt denies pain. Denies hx of antibiotic use. Denies fevers or vomiting. Denies blood in stool. Started on metformin 2 days ago when evaluated in ED for similar complaints.  Labs: significant for anion gap: 15 will give 2L IV fluids and imodium.     7:56 PM Pt states he feels better and feels comfortable being discharged home. Pt states the medicine we gave him, imodium, helped with his diarrhea as he has not had a BM while in ED. Discussed pt with Dr. Vanita Panda who agrees pt may be discharged home. Not concerned for emergent process taking place at this time.  Return precautions provided. Pt verbalized understanding and agreement with tx plan.      Noland Fordyce, PA-C 04/27/14 2010

## 2014-04-27 NOTE — ED Notes (Signed)
Pt presents to department for evaluation of diarrhea. Was seen on Sunday for same and discharged home. Pt states no relief from loose stools. Pt is alert and oriented x4. Friend at bedside to translate.

## 2014-04-28 NOTE — ED Provider Notes (Signed)
  Medical screening examination/treatment/procedure(s) were performed by non-physician practitioner and as supervising physician I was immediately available for consultation/collaboration.   EKG Interpretation None         Carmin Muskrat, MD 04/28/14 1615

## 2014-05-10 ENCOUNTER — Encounter (HOSPITAL_COMMUNITY): Payer: Self-pay | Admitting: Emergency Medicine

## 2014-05-10 ENCOUNTER — Emergency Department (HOSPITAL_COMMUNITY): Payer: Medicare Other

## 2014-05-10 ENCOUNTER — Emergency Department (HOSPITAL_COMMUNITY)
Admission: EM | Admit: 2014-05-10 | Discharge: 2014-05-10 | Disposition: A | Discharge status: Home / Self Care | Payer: Medicare Other | Source: Home / Self Care | Attending: Emergency Medicine | Admitting: Emergency Medicine

## 2014-05-10 DIAGNOSIS — S0990XA Unspecified injury of head, initial encounter: Secondary | ICD-10-CM | POA: Diagnosis not present

## 2014-05-10 DIAGNOSIS — S199XXA Unspecified injury of neck, initial encounter: Secondary | ICD-10-CM | POA: Diagnosis not present

## 2014-05-10 DIAGNOSIS — IMO0001 Reserved for inherently not codable concepts without codable children: Secondary | ICD-10-CM | POA: Diagnosis present

## 2014-05-10 DIAGNOSIS — M79609 Pain in unspecified limb: Secondary | ICD-10-CM | POA: Diagnosis not present

## 2014-05-10 DIAGNOSIS — L089 Local infection of the skin and subcutaneous tissue, unspecified: Secondary | ICD-10-CM | POA: Diagnosis not present

## 2014-05-10 DIAGNOSIS — M542 Cervicalgia: Secondary | ICD-10-CM | POA: Diagnosis not present

## 2014-05-10 DIAGNOSIS — Y92009 Unspecified place in unspecified non-institutional (private) residence as the place of occurrence of the external cause: Secondary | ICD-10-CM

## 2014-05-10 DIAGNOSIS — I6789 Other cerebrovascular disease: Secondary | ICD-10-CM | POA: Diagnosis not present

## 2014-05-10 DIAGNOSIS — W19XXXA Unspecified fall, initial encounter: Secondary | ICD-10-CM

## 2014-05-10 DIAGNOSIS — T148XXA Other injury of unspecified body region, initial encounter: Secondary | ICD-10-CM | POA: Diagnosis not present

## 2014-05-10 DIAGNOSIS — S0190XA Unspecified open wound of unspecified part of head, initial encounter: Secondary | ICD-10-CM | POA: Diagnosis not present

## 2014-05-10 DIAGNOSIS — E119 Type 2 diabetes mellitus without complications: Secondary | ICD-10-CM | POA: Diagnosis not present

## 2014-05-10 DIAGNOSIS — A419 Sepsis, unspecified organism: Secondary | ICD-10-CM | POA: Diagnosis present

## 2014-05-10 DIAGNOSIS — M2420 Disorder of ligament, unspecified site: Secondary | ICD-10-CM | POA: Diagnosis present

## 2014-05-10 DIAGNOSIS — E876 Hypokalemia: Secondary | ICD-10-CM | POA: Diagnosis present

## 2014-05-10 DIAGNOSIS — R7881 Bacteremia: Secondary | ICD-10-CM | POA: Diagnosis not present

## 2014-05-10 DIAGNOSIS — S0101XA Laceration without foreign body of scalp, initial encounter: Secondary | ICD-10-CM

## 2014-05-10 DIAGNOSIS — R229 Localized swelling, mass and lump, unspecified: Secondary | ICD-10-CM | POA: Diagnosis not present

## 2014-05-10 DIAGNOSIS — A4901 Methicillin susceptible Staphylococcus aureus infection, unspecified site: Secondary | ICD-10-CM | POA: Diagnosis present

## 2014-05-10 DIAGNOSIS — S0993XA Unspecified injury of face, initial encounter: Secondary | ICD-10-CM | POA: Diagnosis not present

## 2014-05-10 DIAGNOSIS — Z79899 Other long term (current) drug therapy: Secondary | ICD-10-CM | POA: Diagnosis not present

## 2014-05-10 DIAGNOSIS — J4489 Other specified chronic obstructive pulmonary disease: Secondary | ICD-10-CM | POA: Diagnosis present

## 2014-05-10 DIAGNOSIS — R739 Hyperglycemia, unspecified: Secondary | ICD-10-CM

## 2014-05-10 DIAGNOSIS — L02419 Cutaneous abscess of limb, unspecified: Secondary | ICD-10-CM | POA: Diagnosis not present

## 2014-05-10 DIAGNOSIS — S0100XA Unspecified open wound of scalp, initial encounter: Secondary | ICD-10-CM | POA: Diagnosis not present

## 2014-05-10 DIAGNOSIS — F17200 Nicotine dependence, unspecified, uncomplicated: Secondary | ICD-10-CM | POA: Diagnosis present

## 2014-05-10 DIAGNOSIS — R7309 Other abnormal glucose: Secondary | ICD-10-CM | POA: Diagnosis not present

## 2014-05-10 DIAGNOSIS — D649 Anemia, unspecified: Secondary | ICD-10-CM | POA: Diagnosis present

## 2014-05-10 DIAGNOSIS — J398 Other specified diseases of upper respiratory tract: Secondary | ICD-10-CM | POA: Diagnosis not present

## 2014-05-10 DIAGNOSIS — E871 Hypo-osmolality and hyponatremia: Secondary | ICD-10-CM | POA: Diagnosis present

## 2014-05-10 DIAGNOSIS — R509 Fever, unspecified: Secondary | ICD-10-CM | POA: Diagnosis not present

## 2014-05-10 DIAGNOSIS — R259 Unspecified abnormal involuntary movements: Secondary | ICD-10-CM | POA: Diagnosis not present

## 2014-05-10 LAB — CBG MONITORING, ED
GLUCOSE-CAPILLARY: 450 mg/dL — AB (ref 70–99)
GLUCOSE-CAPILLARY: 314 mg/dL — AB (ref 70–99)
Glucose-Capillary: >600 mg/dL (ref 70–99)

## 2014-05-10 LAB — CBC WITH DIFFERENTIAL/PLATELET
BASOS ABS: 0 10*3/uL (ref 0.0–0.1)
BASOS PCT: 0 % (ref 0–1)
Eosinophils Absolute: 0 10*3/uL (ref 0.0–0.7)
Eosinophils Relative: 0 % (ref 0–5)
HCT: 35.2 % — ABNORMAL LOW (ref 39.0–52.0)
Hemoglobin: 12.4 g/dL — ABNORMAL LOW (ref 13.0–17.0)
Lymphocytes Relative: 12 % (ref 12–46)
Lymphs Abs: 1.6 10*3/uL (ref 0.7–4.0)
MCH: 30.4 pg (ref 26.0–34.0)
MCHC: 35.2 g/dL (ref 30.0–36.0)
MCV: 86.3 fL (ref 78.0–100.0)
Monocytes Absolute: 0.6 10*3/uL (ref 0.1–1.0)
Monocytes Relative: 5 % (ref 3–12)
NEUTROS ABS: 11.1 10*3/uL — AB (ref 1.7–7.7)
Neutrophils Relative %: 83 % — ABNORMAL HIGH (ref 43–77)
PLATELETS: 300 10*3/uL (ref 150–400)
RBC: 4.08 MIL/uL — ABNORMAL LOW (ref 4.22–5.81)
RDW: 12.4 % (ref 11.5–15.5)
WBC: 13.3 10*3/uL — ABNORMAL HIGH (ref 4.0–10.5)

## 2014-05-10 LAB — BASIC METABOLIC PANEL
ANION GAP: 14 (ref 5–15)
BUN: 11 mg/dL (ref 6–23)
CHLORIDE: 86 meq/L — AB (ref 96–112)
CO2: 30 mEq/L (ref 19–32)
CREATININE: 0.65 mg/dL (ref 0.50–1.35)
Calcium: 8.9 mg/dL (ref 8.4–10.5)
GFR calc Af Amer: >90 mL/min (ref >90)
GFR calc non Af Amer: >90 mL/min (ref >90)
Glucose, Bld: 434 mg/dL — ABNORMAL HIGH (ref 70–99)
Potassium: 3.9 mEq/L (ref 3.7–5.3)
Sodium: 130 mEq/L — ABNORMAL LOW (ref 137–147)

## 2014-05-10 MED ORDER — INSULIN ASPART 100 UNIT/ML ~~LOC~~ SOLN
5.0000 [IU] | Freq: Once | SUBCUTANEOUS | Status: AC
  Administered 2014-05-10: 5 [IU] via SUBCUTANEOUS
  Filled 2014-05-10: qty 1

## 2014-05-10 MED ORDER — LOPERAMIDE HCL 2 MG PO CAPS: 2.0000 mg | ORAL_CAPSULE | Freq: Four times a day (QID) | ORAL | Status: DC | PRN

## 2014-05-10 NOTE — ED Provider Notes (Signed)
CSN: [id number]     Arrival date & time 05/10/14  0127 History   First MD Initiated Contact with Patient 05/10/14 0204     Chief Complaint  Patient presents with  . Fall     (Consider location/radiation/quality/duration/timing/severity/associated sxs/prior Treatment) HPI 63 year old male presents to emergency department from home via EMS after a fall.  Patient reports he was going to the bathroom.  He is unsure how he ended up falling.  Family heard him fall and then heard him call out. Pt denies LOC.  Pt found in "pool" of blood, bleeding from left scalp.  Pt has h/o chronic diarrhea, improved on imodium, and poorly controlled diabetes.  Family reports he has established care with pcp with office visit this week.  No fevers, chills, n/v/d.  +Headache, no neck pain.  No urinary symptoms.  Pt is eating/drinking well.   Past Medical History  Diagnosis Date  . Diabetes mellitus without complication    History reviewed. No pertinent past surgical history. No family history on file. History  Substance Use Topics  . Smoking status: Current Every Day Smoker    Types: Cigarettes  . Smokeless tobacco: Not on file  . Alcohol Use: No    Review of Systems  All other systems reviewed and are negative.     Allergies  Review of patient's allergies indicates no known allergies.  Home Medications   Prior to Admission medications   Medication Sig Start Date End Date Taking? Authorizing Provider  loperamide (IMODIUM) 2 MG capsule Take 1 capsule (2 mg total) by mouth 4 (four) times daily as needed for diarrhea or loose stools. 04/27/14  Yes Noland Fordyce, PA-C  metFORMIN (GLUCOPHAGE) 850 MG tablet Take 850 mg by mouth 2 (two) times daily with a meal. 04/25/14  Yes Virgel Manifold, MD   BP 148/83  Pulse 103  Temp(Src) 98.4 F (36.9 C) (Oral)  Resp 20  SpO2 100% Physical Exam  Nursing note and vitals reviewed. Constitutional: He is oriented to person, place, and time. He appears  well-developed and well-nourished.  Thin male, appears older than stated age  HENT:  Head: Normocephalic.  Right Ear: External ear normal.  Left Ear: External ear normal.  Nose: Nose normal.  Mouth/Throat: Oropharynx is clear and moist.  Laceration to left parietal scalp, no active bleeding.  Dried blood to left scalp, ear, neck  Eyes: Conjunctivae and EOM are normal. Pupils are equal, round, and reactive to light.  Neck: Normal range of motion. Neck supple. No JVD present. No tracheal deviation present. No thyromegaly present.  Cardiovascular: Normal rate, regular rhythm, normal heart sounds and intact distal pulses.  Exam reveals no gallop and no friction rub.   No murmur heard. Pulmonary/Chest: Effort normal and breath sounds normal. No stridor. No respiratory distress. He has no wheezes. He has no rales. He exhibits no tenderness.  Abdominal: Soft. Bowel sounds are normal. He exhibits no distension and no mass. There is no tenderness. There is no rebound and no guarding.  Musculoskeletal: Normal range of motion. He exhibits no edema and no tenderness.  Lymphadenopathy:    He has no cervical adenopathy.  Neurological: He is alert and oriented to person, place, and time. He has normal reflexes. No cranial nerve deficit. He exhibits normal muscle tone. Coordination normal.  Skin: Skin is warm and dry. No rash noted. No erythema. No pallor.  Psychiatric: He has a normal mood and affect. His behavior is normal. Judgment and thought content normal.  ED Course  Procedures (including critical care time) Labs Review Labs Reviewed  CBC WITH DIFFERENTIAL - Abnormal; Notable for the following:    WBC 13.3 (*)    RBC 4.08 (*)    Hemoglobin 12.4 (*)    HCT 35.2 (*)    Neutrophils Relative % 83 (*)    Neutro Abs 11.1 (*)    All other components within normal limits  BASIC METABOLIC PANEL - Abnormal; Notable for the following:    Sodium 130 (*)    Chloride 86 (*)    Glucose, Bld 434 (*)     All other components within normal limits  CBG MONITORING, ED - Abnormal; Notable for the following:    Glucose-Capillary 450 (*)    All other components within normal limits    Imaging Review Ct Head Wo Contrast  05/10/2014   CLINICAL DATA:  Found down.  Left temporal laceration.  EXAM: CT HEAD WITHOUT CONTRAST  CT CERVICAL SPINE WITHOUT CONTRAST  TECHNIQUE: Multidetector CT imaging of the head and cervical spine was performed following the standard protocol without intravenous contrast. Multiplanar CT image reconstructions of the cervical spine were also generated.  COMPARISON:  None.  FINDINGS: CT HEAD FINDINGS  The ventricles and sulci are upper limits of normal for patient's age. No intraparenchymal hemorrhage, mass effect nor midline shift. Patchy supratentorial white matter hypodensities are within normal range for patient's age and though non-specific suggest sequelae of chronic small vessel ischemic disease. No acute large vascular territory infarcts.  No abnormal extra-axial fluid collections. Basal cisterns are patent. Moderate calcific atherosclerosis of the carotid siphons.  Moderate left temporal scalp hematoma without subcutaneous gas or radiopaque foreign bodies. No skull fracture. The included ocular globes and orbital contents are non-suspicious. Bilateral maxillary sinus bony wall thickening with paranasal sinusitis consistent with acute on chronic sinusitis. Mildly depressed chronic appearing left nasal bone fracture.  CT CERVICAL SPINE FINDINGS  Cervical vertebral bodies and posterior elements are intact and aligned with maintenance of the cervical lordosis. Intervertebral disc heights preserved. No destructive bony lesions. C1-2 articulation maintained. Included prevertebral and paraspinal soft tissues are not acute, bilateral parotid sialolith. Moderate calcific atherosclerosis of the carotid bulbs. Nuchal ligament calcifications. Moderate C4-5 facet arthropathy.  IMPRESSION: CT  head: Moderate left temporal scalp hematoma without underlying skull fracture nor acute intracranial process.  Mild-to-moderate global brain atrophy, upper limits of normal for age. Mild to moderate white matter changes suggest chronic small vessel ischemic disease.  CT cervical spine:  No acute fracture nor malalignment.   Electronically Signed   By: Elon Alas   On: 05/10/2014 03:33   Ct Cervical Spine Wo Contrast  05/10/2014   CLINICAL DATA:  Found down.  Left temporal laceration.  EXAM: CT HEAD WITHOUT CONTRAST  CT CERVICAL SPINE WITHOUT CONTRAST  TECHNIQUE: Multidetector CT imaging of the head and cervical spine was performed following the standard protocol without intravenous contrast. Multiplanar CT image reconstructions of the cervical spine were also generated.  COMPARISON:  None.  FINDINGS: CT HEAD FINDINGS  The ventricles and sulci are upper limits of normal for patient's age. No intraparenchymal hemorrhage, mass effect nor midline shift. Patchy supratentorial white matter hypodensities are within normal range for patient's age and though non-specific suggest sequelae of chronic small vessel ischemic disease. No acute large vascular territory infarcts.  No abnormal extra-axial fluid collections. Basal cisterns are patent. Moderate calcific atherosclerosis of the carotid siphons.  Moderate left temporal scalp hematoma without subcutaneous gas or radiopaque foreign bodies.  No skull fracture. The included ocular globes and orbital contents are non-suspicious. Bilateral maxillary sinus bony wall thickening with paranasal sinusitis consistent with acute on chronic sinusitis. Mildly depressed chronic appearing left nasal bone fracture.  CT CERVICAL SPINE FINDINGS  Cervical vertebral bodies and posterior elements are intact and aligned with maintenance of the cervical lordosis. Intervertebral disc heights preserved. No destructive bony lesions. C1-2 articulation maintained. Included prevertebral and  paraspinal soft tissues are not acute, bilateral parotid sialolith. Moderate calcific atherosclerosis of the carotid bulbs. Nuchal ligament calcifications. Moderate C4-5 facet arthropathy.  IMPRESSION: CT head: Moderate left temporal scalp hematoma without underlying skull fracture nor acute intracranial process.  Mild-to-moderate global brain atrophy, upper limits of normal for age. Mild to moderate white matter changes suggest chronic small vessel ischemic disease.  CT cervical spine:  No acute fracture nor malalignment.   Electronically Signed   By: Elon Alas   On: 05/10/2014 03:33   LACERATION REPAIR Performed by: Kalman Drape Authorized by: Kalman Drape Consent: Verbal consent obtained. Risks and benefits: risks, benefits and alternatives were discussed Consent given by: patient Patient identity confirmed: provided demographic data Prepped and Draped in normal sterile fashion Wound explored  Laceration Location: left parietal  Laceration Length: 3cm  No Foreign Bodies seen or palpated  Anesthesia: local infiltration  Local anesthetic: lidocaine 2% with epinephrine  Anesthetic total: 4 ml  Irrigation method: syringe Amount of cleaning: standard  Skin closure: staples  Number of sutures: 8  Technique: surgical staples  Patient tolerance: Patient tolerated the procedure well with no immediate complications.   EKG Interpretation None      MDM   Final diagnoses:  Hyperglycemia without ketosis  Fall at home, initial encounter  Scalp laceration, initial encounter    63 yo male with fall, scalp laceration, hyperglycemia with pseudohyponatremia.  Given sq insulin here, laceration repaired.  Family reports f/u with new pcp this week for diabetes.    Kalman Drape, MD 05/10/14 [phone]

## 2014-05-10 NOTE — ED Notes (Signed)
Per EMS, pt was found down by family in a pool of blood. Significant blood loss noted by EMS. Lac located to L temporal region, bleeding controlled. Hx of diabetes and HTN, CBG was 405. Pt given zofran for nausea with relief. Received aprox 650 ml bolus by EMS.

## 2014-05-10 NOTE — ED Notes (Signed)
Pt log rolled and removed from back board maintaining c-spine alignment.

## 2014-05-10 NOTE — Discharge Instructions (Signed)
The staples in your scalp need to be removed in one week.  This can be done at your primary care doctor's office, urgent care, or in the emergency department.  Watch for signs of infection.  This can be redness, swelling, drainage of pus.  He may shower and pat the area dry.  Your blood sugar was elevated today.  You may need to be started on different medications for your diabetes.  Bring this up with your primary care Dr. this week.  Drink plenty of fluids.  Stick to a diabetic diet.  Return to the emergency department for worsening condition or new concerning symptoms.   Staple Wound Closure Staples are used to help a wound heal faster by holding the edges of the wound together. HOME CARE  Keep the area around the staples clean and dry.  Rest and raise (elevate) the injured part above the level of your heart.  See your doctor for a follow-up check of the wound.  See your doctor to have the staples removed.  Clean the wound daily with water.  Do not soak the wound in water for long periods of time.  Let air reach the wound as it heals. GET HELP RIGHT AWAY IF:   You have redness or puffiness around the wound.  You have a red line going away from the wound.  You have more pain or tenderness.  You have yellowish-white fluid (pus) coming from the wound.  Your wound does not stay together after the staples have been taken out.  You see something coming out of the wound, such as wood or glass.  You have problems moving the injured area.  You have a fever or lasting symptoms for more than 2-3 days.  You have a fever and your symptoms suddenly get worse. MAKE SURE YOU:   Understand these instructions.  Will watch this condition.  Will get help right away if you are not doing well or get worse. Document Released: 07/24/2008 Document Revised: 07/09/2012 Document Reviewed: 04/27/2012 [facility] Patient Information 2015 [place], [place]. This information is not intended to replace  advice given to you by your health care provider. Make sure you discuss any questions you have with your health care provider.  Hyperglycemia Hyperglycemia occurs when the glucose (sugar) in your blood is too high. Hyperglycemia can happen for many reasons, but it most often happens to people who do not know they have diabetes or are not managing their diabetes properly.  CAUSES  Whether you have diabetes or not, there are other causes of hyperglycemia. Hyperglycemia can occur when you have diabetes, but it can also occur in other situations that you might not be as aware of, such as: Diabetes  If you have diabetes and are having problems controlling your blood glucose, hyperglycemia could occur because of some of the following reasons:  Not following your meal plan.  Not taking your diabetes medications or not taking it properly.  Exercising less or doing less activity than you normally do.  Being sick. Pre-diabetes  This cannot be ignored. Before people develop Type 2 diabetes, they almost always have "pre-diabetes." This is when your blood glucose levels are higher than normal, but not yet high enough to be diagnosed as diabetes. Research has shown that some long-term damage to the body, especially the heart and circulatory system, may already be occurring during pre-diabetes. If you take action to manage your blood glucose when you have pre-diabetes, you may delay or prevent Type 2 diabetes from  developing. Stress  If you have diabetes, you may be "diet" controlled or on oral medications or insulin to control your diabetes. However, you may find that your blood glucose is higher than usual in the hospital whether you have diabetes or not. This is often referred to as "stress hyperglycemia." Stress can elevate your blood glucose. This happens because of hormones put out by the body during times of stress. If stress has been the cause of your high blood glucose, it can be followed regularly  by your caregiver. That way he/she can make sure your hyperglycemia does not continue to get worse or progress to diabetes. Steroids  Steroids are medications that act on the infection fighting system (immune system) to block inflammation or infection. One side effect can be a rise in blood glucose. Most people can produce enough extra insulin to allow for this rise, but for those who cannot, steroids make blood glucose levels go even higher. It is not unusual for steroid treatments to "uncover" diabetes that is developing. It is not always possible to determine if the hyperglycemia will go away after the steroids are stopped. A special blood test called an A1c is sometimes done to determine if your blood glucose was elevated before the steroids were started. SYMPTOMS  Thirsty.  Frequent urination.  Dry mouth.  Blurred vision.  Tired or fatigue.  Weakness.  Sleepy.  Tingling in feet or leg. DIAGNOSIS  Diagnosis is made by monitoring blood glucose in one or all of the following ways:  A1c test. This is a chemical found in your blood.  Fingerstick blood glucose monitoring.  Laboratory results. TREATMENT  First, knowing the cause of the hyperglycemia is important before the hyperglycemia can be treated. Treatment may include, but is not be limited to:  Education.  Change or adjustment in medications.  Change or adjustment in meal plan.  Treatment for an illness, infection, etc.  More frequent blood glucose monitoring.  Change in exercise plan.  Decreasing or stopping steroids.  Lifestyle changes. HOME CARE INSTRUCTIONS   Test your blood glucose as directed.  Exercise regularly. Your caregiver will give you instructions about exercise. Pre-diabetes or diabetes which comes on with stress is helped by exercising.  Eat wholesome, balanced meals. Eat often and at regular, fixed times. Your caregiver or nutritionist will give you a meal plan to guide your sugar  intake.  Being at an ideal weight is important. If needed, losing as little as 10 to 15 pounds may help improve blood glucose levels. SEEK MEDICAL CARE IF:   You have questions about medicine, activity, or diet.  You continue to have symptoms (problems such as increased thirst, urination, or weight gain). SEEK IMMEDIATE MEDICAL CARE IF:   You are vomiting or have diarrhea.  Your breath smells fruity.  You are breathing faster or slower.  You are very sleepy or incoherent.  You have numbness, tingling, or pain in your feet or hands.  You have chest pain.  Your symptoms get worse even though you have been following your caregiver's orders.  If you have any other questions or concerns. Document Released: 04/10/2001 Document Revised: 01/07/2012 Document Reviewed: 02/11/2012 [facility] Patient Information 2015 [place], [place]. This information is not intended to replace advice given to you by your health care provider. Make sure you discuss any questions you have with your health care provider.

## 2014-05-13 ENCOUNTER — Inpatient Hospital Stay (HOSPITAL_COMMUNITY)
Admission: EM | Admit: 2014-05-13 | Discharge: 2014-05-16 | DRG: 872 | Discharge status: Against Medical Advice | Payer: Medicare Other | Attending: Internal Medicine | Admitting: Internal Medicine

## 2014-05-13 ENCOUNTER — Emergency Department (HOSPITAL_COMMUNITY): Payer: Medicare Other

## 2014-05-13 DIAGNOSIS — L03115 Cellulitis of right lower limb: Secondary | ICD-10-CM

## 2014-05-13 DIAGNOSIS — E871 Hypo-osmolality and hyponatremia: Secondary | ICD-10-CM | POA: Diagnosis not present

## 2014-05-13 DIAGNOSIS — R7881 Bacteremia: Secondary | ICD-10-CM | POA: Diagnosis not present

## 2014-05-13 DIAGNOSIS — IMO0001 Reserved for inherently not codable concepts without codable children: Secondary | ICD-10-CM | POA: Diagnosis not present

## 2014-05-13 DIAGNOSIS — J449 Chronic obstructive pulmonary disease, unspecified: Secondary | ICD-10-CM | POA: Diagnosis present

## 2014-05-13 DIAGNOSIS — D649 Anemia, unspecified: Secondary | ICD-10-CM

## 2014-05-13 DIAGNOSIS — M629 Disorder of muscle, unspecified: Secondary | ICD-10-CM | POA: Diagnosis present

## 2014-05-13 DIAGNOSIS — A4901 Methicillin susceptible Staphylococcus aureus infection, unspecified site: Secondary | ICD-10-CM | POA: Diagnosis present

## 2014-05-13 DIAGNOSIS — L02419 Cutaneous abscess of limb, unspecified: Secondary | ICD-10-CM | POA: Diagnosis not present

## 2014-05-13 DIAGNOSIS — R509 Fever, unspecified: Secondary | ICD-10-CM | POA: Diagnosis not present

## 2014-05-13 DIAGNOSIS — L03119 Cellulitis of unspecified part of limb: Secondary | ICD-10-CM

## 2014-05-13 DIAGNOSIS — J398 Other specified diseases of upper respiratory tract: Secondary | ICD-10-CM | POA: Diagnosis not present

## 2014-05-13 DIAGNOSIS — F17200 Nicotine dependence, unspecified, uncomplicated: Secondary | ICD-10-CM | POA: Diagnosis present

## 2014-05-13 DIAGNOSIS — D72819 Decreased white blood cell count, unspecified: Secondary | ICD-10-CM

## 2014-05-13 DIAGNOSIS — IMO0002 Reserved for concepts with insufficient information to code with codable children: Secondary | ICD-10-CM

## 2014-05-13 DIAGNOSIS — E876 Hypokalemia: Secondary | ICD-10-CM | POA: Diagnosis present

## 2014-05-13 DIAGNOSIS — Z79899 Other long term (current) drug therapy: Secondary | ICD-10-CM

## 2014-05-13 DIAGNOSIS — M2420 Disorder of ligament, unspecified site: Secondary | ICD-10-CM | POA: Diagnosis present

## 2014-05-13 DIAGNOSIS — J4489 Other specified chronic obstructive pulmonary disease: Secondary | ICD-10-CM | POA: Diagnosis present

## 2014-05-13 DIAGNOSIS — M79609 Pain in unspecified limb: Secondary | ICD-10-CM | POA: Diagnosis not present

## 2014-05-13 DIAGNOSIS — I6789 Other cerebrovascular disease: Secondary | ICD-10-CM | POA: Diagnosis not present

## 2014-05-13 DIAGNOSIS — R229 Localized swelling, mass and lump, unspecified: Secondary | ICD-10-CM | POA: Diagnosis not present

## 2014-05-13 DIAGNOSIS — R259 Unspecified abnormal involuntary movements: Secondary | ICD-10-CM | POA: Diagnosis not present

## 2014-05-13 DIAGNOSIS — R7309 Other abnormal glucose: Secondary | ICD-10-CM | POA: Diagnosis not present

## 2014-05-13 DIAGNOSIS — B9561 Methicillin susceptible Staphylococcus aureus infection as the cause of diseases classified elsewhere: Secondary | ICD-10-CM

## 2014-05-13 DIAGNOSIS — Z794 Long term (current) use of insulin: Secondary | ICD-10-CM

## 2014-05-13 DIAGNOSIS — A419 Sepsis, unspecified organism: Principal | ICD-10-CM

## 2014-05-13 DIAGNOSIS — L089 Local infection of the skin and subcutaneous tissue, unspecified: Secondary | ICD-10-CM | POA: Diagnosis not present

## 2014-05-13 DIAGNOSIS — E1165 Type 2 diabetes mellitus with hyperglycemia: Secondary | ICD-10-CM

## 2014-05-13 LAB — CBC WITH DIFFERENTIAL/PLATELET
BASOS PCT: 0 % (ref 0–1)
Basophils Absolute: 0 10*3/uL (ref 0.0–0.1)
Eosinophils Absolute: 0.1 10*3/uL (ref 0.0–0.7)
Eosinophils Relative: 1 % (ref 0–5)
HEMATOCRIT: 31.7 % — AB (ref 39.0–52.0)
HEMOGLOBIN: 11.1 g/dL — AB (ref 13.0–17.0)
Lymphocytes Relative: 10 % — ABNORMAL LOW (ref 12–46)
Lymphs Abs: 1.3 10*3/uL (ref 0.7–4.0)
MCH: 30.2 pg (ref 26.0–34.0)
MCHC: 35 g/dL (ref 30.0–36.0)
MCV: 86.1 fL (ref 78.0–100.0)
MONO ABS: 0.6 10*3/uL (ref 0.1–1.0)
MONOS PCT: 4 % (ref 3–12)
Neutro Abs: 11.3 10*3/uL — ABNORMAL HIGH (ref 1.7–7.7)
Neutrophils Relative %: 85 % — ABNORMAL HIGH (ref 43–77)
Platelets: 287 10*3/uL (ref 150–400)
RBC: 3.68 MIL/uL — AB (ref 4.22–5.81)
RDW: 12.3 % (ref 11.5–15.5)
WBC: 13.3 10*3/uL — ABNORMAL HIGH (ref 4.0–10.5)

## 2014-05-13 LAB — URINE MICROSCOPIC-ADD ON

## 2014-05-13 LAB — URINALYSIS, ROUTINE W REFLEX MICROSCOPIC
BILIRUBIN URINE: NEGATIVE
Glucose, UA: >1000 mg/dL — AB
KETONES UR: NEGATIVE mg/dL
Leukocytes, UA: NEGATIVE
NITRITE: NEGATIVE
Protein, ur: NEGATIVE mg/dL
Specific Gravity, Urine: 1.03 (ref 1.005–1.030)
UROBILINOGEN UA: 0.2 mg/dL (ref 0.0–1.0)
pH: 7 (ref 5.0–8.0)

## 2014-05-13 LAB — COMPREHENSIVE METABOLIC PANEL
ALBUMIN: 3 g/dL — AB (ref 3.5–5.2)
ALT: 8 U/L (ref 0–53)
ANION GAP: 13 (ref 5–15)
AST: 10 U/L (ref 0–37)
Alkaline Phosphatase: 132 U/L — ABNORMAL HIGH (ref 39–117)
BILIRUBIN TOTAL: 0.4 mg/dL (ref 0.3–1.2)
BUN: 12 mg/dL (ref 6–23)
CHLORIDE: 83 meq/L — AB (ref 96–112)
CO2: 27 mEq/L (ref 19–32)
CREATININE: 0.62 mg/dL (ref 0.50–1.35)
Calcium: 9.2 mg/dL (ref 8.4–10.5)
GFR calc Af Amer: >90 mL/min (ref >90)
GFR calc non Af Amer: >90 mL/min (ref >90)
GLUCOSE: 614 mg/dL — AB (ref 70–99)
Potassium: 4.2 mEq/L (ref 3.7–5.3)
Sodium: 123 mEq/L — ABNORMAL LOW (ref 137–147)
Total Protein: 7.9 g/dL (ref 6.0–8.3)

## 2014-05-13 LAB — GLUCOSE, CAPILLARY: Glucose-Capillary: 410 mg/dL — ABNORMAL HIGH (ref 70–99)

## 2014-05-13 LAB — CBG MONITORING, ED
Glucose-Capillary: 594 mg/dL (ref 70–99)
Glucose-Capillary: 399 mg/dL — ABNORMAL HIGH (ref 70–99)

## 2014-05-13 LAB — I-STAT CG4 LACTIC ACID, ED
Lactic Acid, Venous: 2.09 mmol/L (ref 0.5–2.2)
Lactic Acid, Venous: 0.84 mmol/L (ref 0.5–2.2)

## 2014-05-13 LAB — MRSA PCR SCREENING: MRSA by PCR: NEGATIVE

## 2014-05-13 MED ORDER — INSULIN ASPART 100 UNIT/ML ~~LOC~~ SOLN
15.0000 [IU] | Freq: Once | SUBCUTANEOUS | Status: AC
  Administered 2014-05-13: 15 [IU] via SUBCUTANEOUS

## 2014-05-13 MED ORDER — PIPERACILLIN-TAZOBACTAM 3.375 G IVPB 30 MIN: 3.3750 g | Freq: Three times a day (TID) | INTRAVENOUS | Status: DC

## 2014-05-13 MED ORDER — SODIUM CHLORIDE 0.9 % IV SOLN
1000.0000 mL | INTRAVENOUS | Status: DC
  Administered 2014-05-13: 1000 mL via INTRAVENOUS

## 2014-05-13 MED ORDER — SODIUM CHLORIDE 0.9 % IV BOLUS (SEPSIS)
30.0000 mL/kg | Freq: Once | INTRAVENOUS | Status: AC
  Administered 2014-05-13: 1428 mL via INTRAVENOUS

## 2014-05-13 MED ORDER — ENOXAPARIN SODIUM 40 MG/0.4ML ~~LOC~~ SOLN
40.0000 mg | Freq: Every day | SUBCUTANEOUS | Status: DC
  Administered 2014-05-13 – 2014-05-14 (×2): 40 mg via SUBCUTANEOUS
  Filled 2014-05-13 (×5): qty 0.4

## 2014-05-13 MED ORDER — SODIUM CHLORIDE 0.9 % IV SOLN
INTRAVENOUS | Status: DC
  Administered 2014-05-13 – 2014-05-15 (×3): via INTRAVENOUS
  Filled 2014-05-13 (×13): qty 1000

## 2014-05-13 MED ORDER — ACETAMINOPHEN 650 MG RE SUPP: 650.0000 mg | Freq: Four times a day (QID) | RECTAL | Status: DC | PRN

## 2014-05-13 MED ORDER — ACETAMINOPHEN 325 MG PO TABS: 650.0000 mg | ORAL_TABLET | Freq: Four times a day (QID) | ORAL | Status: DC | PRN

## 2014-05-13 MED ORDER — PIPERACILLIN-TAZOBACTAM 3.375 G IVPB
3.3750 g | Freq: Three times a day (TID) | INTRAVENOUS | Status: DC
  Administered 2014-05-14 – 2014-05-15 (×4): 3.375 g via INTRAVENOUS
  Filled 2014-05-13 (×5): qty 50

## 2014-05-13 MED ORDER — PIPERACILLIN-TAZOBACTAM 3.375 G IVPB 30 MIN
3.3750 g | Freq: Once | INTRAVENOUS | Status: AC
  Administered 2014-05-13: 3.375 g via INTRAVENOUS
  Filled 2014-05-13: qty 50

## 2014-05-13 MED ORDER — SODIUM CHLORIDE 0.9 % IJ SOLN
3.0000 mL | Freq: Two times a day (BID) | INTRAMUSCULAR | Status: DC
Start: 2014-05-13 — End: 2014-05-16
  Administered 2014-05-14 – 2014-05-15 (×3): 3 mL via INTRAVENOUS

## 2014-05-13 MED ORDER — ONDANSETRON HCL 4 MG/2ML IJ SOLN: 4.0000 mg | Freq: Four times a day (QID) | INTRAMUSCULAR | Status: DC | PRN

## 2014-05-13 MED ORDER — INSULIN ASPART 100 UNIT/ML ~~LOC~~ SOLN
0.0000 [IU] | Freq: Three times a day (TID) | SUBCUTANEOUS | Status: DC
  Administered 2014-05-14: 3 [IU] via SUBCUTANEOUS
  Administered 2014-05-15: 1 [IU] via SUBCUTANEOUS
  Administered 2014-05-15 (×2): 5 [IU] via SUBCUTANEOUS

## 2014-05-13 MED ORDER — INSULIN GLARGINE 100 UNIT/ML ~~LOC~~ SOLN
15.0000 [IU] | Freq: Every day | SUBCUTANEOUS | Status: DC
  Administered 2014-05-13: 15 [IU] via SUBCUTANEOUS
  Filled 2014-05-13 (×2): qty 0.15

## 2014-05-13 MED ORDER — ONDANSETRON HCL 4 MG PO TABS: 4.0000 mg | ORAL_TABLET | Freq: Four times a day (QID) | ORAL | Status: DC | PRN

## 2014-05-13 MED ORDER — ACETAMINOPHEN 325 MG PO TABS
650.0000 mg | ORAL_TABLET | Freq: Once | ORAL | Status: AC
  Administered 2014-05-13: 650 mg via ORAL
  Filled 2014-05-13: qty 2

## 2014-05-13 MED ORDER — VANCOMYCIN HCL 500 MG IV SOLR
500.0000 mg | Freq: Two times a day (BID) | INTRAVENOUS | Status: DC
  Administered 2014-05-14 – 2014-05-15 (×4): 500 mg via INTRAVENOUS
  Filled 2014-05-13 (×7): qty 500

## 2014-05-13 MED ORDER — VANCOMYCIN HCL IN DEXTROSE 1-5 GM/200ML-% IV SOLN
1000.0000 mg | Freq: Once | INTRAVENOUS | Status: AC
  Administered 2014-05-13: 1000 mg via INTRAVENOUS
  Filled 2014-05-13: qty 200

## 2014-05-13 MED ORDER — INSULIN ASPART 100 UNIT/ML ~~LOC~~ SOLN
0.0000 [IU] | Freq: Every day | SUBCUTANEOUS | Status: DC
  Administered 2014-05-15: 4 [IU] via SUBCUTANEOUS

## 2014-05-13 NOTE — ED Notes (Signed)
Per EMS, pt from home.  Pt c/o getting out of shower at about 15 min prior to EMS arrival.  Pt states he was having tremors and felt shakey.  Pt takes metformin at home and has been continuing his meds appropriately.  Pt cbg in route registered high.  A/O x 4.  Very little [place].  Vitals 161/91, hr 106, resp 18, 97% ra.  Hx DM

## 2014-05-13 NOTE — Progress Notes (Signed)
ICU/SD Called ED to get report.

## 2014-05-13 NOTE — ED Provider Notes (Signed)
Medical screening examination/treatment/procedure(s) were conducted as a shared visit with non-physician practitioner(s) and myself.  I personally evaluated the patient during the encounter.   EKG Interpretation None       Leota Jacobsen, MD 05/13/14 2252

## 2014-05-13 NOTE — H&P (Addendum)
History and Physical  Brian Owen [id number] DOB: 05/02/1951 DOA: 05/13/2014   PCP: No PCP Per Patient   Chief Complaint: Fevers, chills, rigors  HPI:  63 year old male with a history of uncontrolled diabetes mellitus presented with fevers, chills, and rigors on the morning of admission. The patient had a mechanical fall approximately 3 days prior to admission resulting in a scalp laceration/hematoma. Staples were placed in the emergency department on 05/10/2014 and the patient was discharged home. CT of the brain and CT of the cervical spine were otherwise unremarkable except for his scalp hematoma. The patient denies any chest pain, shortness breath, nausea, vomiting. He has chronic loose stools which have somewhat improved since stopping metformin. The patient was started on metformin when he was discharged from the emergency department in 04/25/2014.Marland Kitchen He has not followed up with primary care physician. Patient denies any coughing, hemoptysis, dysuria, hematuria, headache, abdominal pain. The patient complains of right thigh pain on the distal third of his posterior thigh. He first noticed the pain on the morning of admission.  In the emergency department, the patient was noted to have temperature 100.39F and he was tachycardic with heart rate of 117 the patient was given 2 L normal saline and started on vancomycin and Zosyn. BMP was significant for sodium 123, glucose was 614. Bicarbonate was 27. WBC was 13.3. Urinalysis was negative for pyuria. Lactic acid was 2.09. Chest x-ray showed peribronchial thickening.  CBG improved to 399 with just IVF.  R-leg duplex ordered by ED--pending Assessment/Plan: Sepsis -Present at the time of admission -Possible sources include cellulitis of his right posterior thigh as well as bacteremia and possible underlying abscess -Continue IV fluids Cellulitis--R-thigh -does not appear overly impressive from infection standpoint -there is a nonblanching  component to the patient's area in question-->?underlying hematoma--?infected -check CK -MR right thigh -Blood cultures have very been obtained in the emergency department -Continue empiric vancomycin and Zosyn pending culture data   diabetes mellitus, uncontrolled  -Hemoglobin A1c 16.0--04/25/2014  -start lantus and novolog sliding scale -IVF Hyponatremia  -Partly due to  Hyperglycemia and volume depletion -IVF Hx of tobacco use with presumptive COPD -compensated       Past Medical History  Diagnosis Date  . Diabetes mellitus without complication    No past surgical history on file. Social History:  reports that he has been smoking Cigarettes.  He has been smoking about 0.00 packs per day. He does not have any smokeless tobacco history on file. He reports that he does not drink alcohol or use illicit drugs.   Family history review--hx of DM  No Known Allergies    Prior to Admission medications   Medication Sig Start Date End Date Taking? Authorizing Provider  loperamide (IMODIUM) 2 MG capsule Take 1 capsule (2 mg total) by mouth 4 (four) times daily as needed for diarrhea or loose stools. 05/10/14  Yes Kalman Drape, MD  metFORMIN (GLUCOPHAGE) 850 MG tablet Take 850 mg by mouth 2 (two) times daily with a meal. 04/25/14  Yes Virgel Manifold, MD    Review of Systems:  Constitutional:  No weight loss, night sweats Head&Eyes: No headache.  No vision loss.  No eye pain or scotoma ENT:  No Difficulty swallowing,Tooth/dental problems,Sore throat,  No ear ache, post nasal drip,  Cardio-vascular:  No chest pain, Orthopnea, PND, swelling in lower extremities,   palpitations  GI:  No  abdominal pain, nausea, vomiting, diarrhea, loss of appetite, hematochezia, melena, heartburn, indigestion, Resp:  No shortness of breath with exertion or at rest. No cough. No coughing up of blood .No wheezing.No chest wall deformity  Skin:  R-leg rash GU:  no dysuria, change in color of  urine, no urgency or frequency. No flank pain.  Musculoskeletal:  No joint pain or swelling. No decreased range of motion. No back pain.  Psych:  No change in mood or affect. No depression or anxiety. Neurologic: No headache, no dysesthesia, no focal weakness, no vision loss. No syncope  Physical Exam: Filed Vitals:   05/13/14 1845 05/13/14 1900 05/13/14 1915 05/13/14 1934  BP:    130/70  Pulse:  117  94  Temp:    99.9 F (37.7 C)  TempSrc:    Oral  Resp: 26 25 28 23   Weight:      SpO2:  97%  97%   General:  A&O x 3, NAD, nontoxic, pleasant/cooperative Head/Eye: No conjunctival hemorrhage, no icterus, [place]/AT, No nystagmus ENT:  No icterus,  No thrush, edentulous, no pharyngeal exudate Neck:  No masses, no lymphadenpathy, no meningismus CV:  RRR, no rub, no gallop, no S3 Lung:  Scattered bilateral rales without wheezing. Good air movement. Abdomen: soft/NT, +BS, nondistended, no peritoneal signs Ext: Right posterior thigh with erythema on the distal third of the femur with blanching and nonblanching components, warm to touch without crepitance or necrosis; otherwise no edema or other rashes of the lower upper extremities    Labs on Admission:  Basic Metabolic Panel:  Recent Labs Lab 05/10/14 0222 05/13/14 1834  NA 130* 123*  K 3.9 4.2  CL 86* 83*  CO2 30 27  GLUCOSE 434* 614*  BUN 11 12  CREATININE 0.65 0.62  CALCIUM 8.9 9.2   Liver Function Tests:  Recent Labs Lab 05/13/14 1834  AST 10  ALT 8  ALKPHOS 132*  BILITOT 0.4  PROT 7.9  ALBUMIN 3.0*   No results found for this basename: LIPASE, AMYLASE,  in the last 168 hours No results found for this basename: AMMONIA,  in the last 168 hours CBC:  Recent Labs Lab 05/10/14 0222 05/13/14 1834  WBC 13.3* 13.3*  NEUTROABS 11.1* 11.3*  HGB 12.4* 11.1*  HCT 35.2* 31.7*  MCV 86.3 86.1  PLT 300 287   Cardiac Enzymes: No results found for this basename: CKTOTAL, CKMB, CKMBINDEX, TROPONINI,  in the last 168  hours BNP: No components found with this basename: POCBNP,  CBG:  Recent Labs Lab 05/10/14 0139 05/10/14 0507 05/10/14 0511 05/13/14 1817 05/13/14 2030  GLUCAP 450* >600* 314* 594* 399*    Radiological Exams on Admission: Dg Chest Port 1 View  05/13/2014   CLINICAL DATA:  Hyperglycemia  EXAM: PORTABLE CHEST - 1 VIEW  COMPARISON:  06/30/2008  FINDINGS: Numerous leads and wires project over the chest. Midline trachea. Normal heart size. Transverse aortic atherosclerosis. No pleural effusion or pneumothorax. Diffuse peribronchial thickening. Clear lungs.  IMPRESSION: No acute cardiopulmonary disease.  Peribronchial thickening which may relate to chronic bronchitis or smoking.  Atherosclerosis.   Electronically Signed   By: Abigail Miyamoto M.D.   On: 05/13/2014 19:03       Time spent:60 minutes Code Status:   FULL Family Communication:   Daughter at bedside   Keiran Gaffey, DO  [facility] Pager [phone]  If 7PM-7AM, please contact night-coverage [email or web address] Password [facility] 05/13/2014, 8:35 PM

## 2014-05-13 NOTE — ED Notes (Signed)
Bed: RESA Expected date:  Expected time:  Means of arrival:  Comments: 26 M - Hyperglycemia

## 2014-05-13 NOTE — ED Provider Notes (Signed)
CSN: [id number]     Arrival date & time 05/13/14  1812 History   First MD Initiated Contact with Patient 05/13/14 1813     Chief Complaint  Patient presents with  . Hyperglycemia     (Consider location/radiation/quality/duration/timing/severity/associated sxs/prior Treatment) HPI Comments: 63 year old male with a past medical history of diabetes presents to the emergency department via EMS with his daughter from home after he was feeling "shaky" when he got out of the shower about 15 minutes prior to EMS arrival. Patient states he was in the shower and did not feel well, when he came out he laid on the bed and started "shaking". His daughter witnessed the shaking. No history of seizures. On the MS arrival, he was no longer shaking. CBG read "high" and he was slightly hypertensive, tachycardic. Patient states he has been taking his metformin as directed daily. States over the past day it has been very painful to walk on his right leg and he has noticed a red area on his right thigh. No known injury or trauma. No history of blood clots. Daughter states his right thigh appeared larger than the left earlier today. Patient denies recent fevers. He was seen in the emergency department 3 days ago after a fall, had a negative CT head and neck. He was also seen in the emergency department a couple weeks back for diarrhea. He reports he is no longer having diarrhea. Denies abdominal pain, nausea, vomiting, chest pain, shortness of breath or urinary symptoms.  Patient is a 63 y.o. male presenting with hyperglycemia. The history is provided by the patient, a relative and the EMS personnel.  Hyperglycemia Associated symptoms: fatigue     Past Medical History  Diagnosis Date  . Diabetes mellitus without complication    No past surgical history on file. No family history on file. History  Substance Use Topics  . Smoking status: Current Every Day Smoker    Types: Cigarettes  . Smokeless tobacco: Not on  file  . Alcohol Use: No    Review of Systems  Constitutional: Positive for fatigue.  Musculoskeletal:       + right thigh pain.  Neurological:       + "shaking".  All other systems reviewed and are negative.     Allergies  Review of patient's allergies indicates no known allergies.  Home Medications   Prior to Admission medications   Medication Sig Start Date End Date Taking? Authorizing Provider  loperamide (IMODIUM) 2 MG capsule Take 1 capsule (2 mg total) by mouth 4 (four) times daily as needed for diarrhea or loose stools. 05/10/14  Yes Kalman Drape, MD  metFORMIN (GLUCOPHAGE) 850 MG tablet Take 850 mg by mouth 2 (two) times daily with a meal. 04/25/14  Yes Virgel Manifold, MD   BP 130/70  Pulse 94  Temp(Src) 99.9 F (37.7 C) (Oral)  Resp 23  Wt 105 lb (47.628 kg)  SpO2 97% Physical Exam  Nursing note and vitals reviewed. Constitutional: He is oriented to person, place, and time. He appears well-developed and well-nourished. He appears cachectic. He appears ill.  Shaking rigors.  HENT:  Head: Normocephalic and atraumatic.  Dry MM.  Eyes: Conjunctivae are normal.  Neck: Normal range of motion. Neck supple.  Cardiovascular: Regular rhythm, normal heart sounds and intact distal pulses.  Tachycardia present.   Pulmonary/Chest: Breath sounds normal. Tachypnea noted.  Abdominal: Soft. Bowel sounds are normal. There is generalized tenderness. There is no rigidity, no rebound and no guarding.  No peritoneal signs.  Musculoskeletal: Normal range of motion.       Legs: Neurological: He is alert and oriented to person, place, and time.  Skin: Skin is warm and dry. He is not diaphoretic.  Psychiatric: He has a normal mood and affect. His behavior is normal.    ED Course  Procedures (including critical care time) Labs Review Labs Reviewed  CBC WITH DIFFERENTIAL - Abnormal; Notable for the following:    WBC 13.3 (*)    RBC 3.68 (*)    Hemoglobin 11.1 (*)    HCT 31.7 (*)     Neutrophils Relative % 85 (*)    Neutro Abs 11.3 (*)    Lymphocytes Relative 10 (*)    All other components within normal limits  COMPREHENSIVE METABOLIC PANEL - Abnormal; Notable for the following:    Sodium 123 (*)    Chloride 83 (*)    Glucose, Bld 614 (*)    Albumin 3.0 (*)    Alkaline Phosphatase 132 (*)    All other components within normal limits  URINALYSIS, ROUTINE W REFLEX MICROSCOPIC - Abnormal; Notable for the following:    Glucose, UA >1000 (*)    Hgb urine dipstick TRACE (*)    All other components within normal limits  CBG MONITORING, ED - Abnormal; Notable for the following:    Glucose-Capillary 594 (*)    All other components within normal limits  CULTURE, BLOOD (ROUTINE X 2)  CULTURE, BLOOD (ROUTINE X 2)  URINE CULTURE  URINE MICROSCOPIC-ADD ON  I-STAT CG4 LACTIC ACID, ED  I-STAT CG4 LACTIC ACID, ED    Imaging Review Dg Chest Port 1 View  05/13/2014   CLINICAL DATA:  Hyperglycemia  EXAM: PORTABLE CHEST - 1 VIEW  COMPARISON:  06/30/2008  FINDINGS: Numerous leads and wires project over the chest. Midline trachea. Normal heart size. Transverse aortic atherosclerosis. No pleural effusion or pneumothorax. Diffuse peribronchial thickening. Clear lungs.  IMPRESSION: No acute cardiopulmonary disease.  Peribronchial thickening which may relate to chronic bronchitis or smoking.  Atherosclerosis.   Electronically Signed   By: Abigail Miyamoto M.D.   On: 05/13/2014 19:03     EKG Interpretation None      MDM   Final diagnoses:  Cellulitis of right leg  Sepsis affecting skin   Patient presenting with shaking riders, febrile at 102.4, tachycardic. Probable sepsis. Infection most likely from right thigh. Hyperglycemia most likely from infection. Patient receiving IV fluids, Tylenol. Septic workup pending. Will also obtain lower extremity venous duplex to r/o DVT. 8:09 PM Labs with leukocytosis 13.3. Glucose 614. Sodium 123. Lactic acid within normal limits. Chest x-ray  without any acute abnormality. Broad spectrum antibiotics started, vancomycin and Zosyn. Temperature decreased to 99.9, heart rate now normal. Blood pressure stable. Patient will be admitted to the step down unit, admission accepted by Dr. Carles Collet, [facility].  Case discussed with attending Dr. Zenia Resides who also evaluated patient and agrees with plan of care.   Illene Labrador, PA-C 05/13/14 2011

## 2014-05-13 NOTE — Progress Notes (Signed)
ANTIBIOTIC CONSULT NOTE - INITIAL  Pharmacy Consult for Vancomycin/Zosyn Indication: rule out sepsis  No Known Allergies  Patient Measurements: Weight: 105 lb (47.628 kg)  Vital Signs: Temp: 99.9 F (37.7 C) (07/16 1934) Temp src: Oral (07/16 1934) BP: 130/70 mmHg (07/16 1934) Pulse Rate: 94 (07/16 1934) Intake/Output from previous day:   Intake/Output from this shift:    Labs:  Recent Labs  05/13/14 1834  WBC 13.3*  HGB 11.1*  PLT 287  CREATININE 0.62   CrCl is unknown because there is no height on file for the current visit. No results found for this basename: VANCOTROUGH, VANCOPEAK, VANCORANDOM, GENTTROUGH, GENTPEAK, GENTRANDOM, TOBRATROUGH, TOBRAPEAK, TOBRARND, AMIKACINPEAK, AMIKACINTROU, AMIKACIN,  in the last 72 hours   Microbiology: No results found for this or any previous visit (from the past 720 hour(s)).  Medical History: Past Medical History  Diagnosis Date  . Diabetes mellitus without complication     Medications:  Scheduled:   Infusions:  . sodium chloride 1,000 mL (05/13/14 1912)  . sodium chloride     Followed by  . sodium chloride    . piperacillin-tazobactam    . vancomycin     PRN:  Assessment: 17 yom with PMH of DM. In ED with shaking rigors, febrile at 102.4, tachycardic. Probable sepsis. Infection most likely from right thigh. Pharmacy consulted to dose Vancomycin & Zosyn for sepsis. (Vanc 1g and Zosyn 3.375g given x1 in ED)  7/16 >>Zosyn >> 7/16 >>Vanco >>   Tmax: 102.3 WBCs: 13.3 Renal: SCr 0.62 CrCl 73ml/min  7/16 blood: sent 7/16 urine: sent  Goal of Therapy:  Vancomycin trough level 15-20 mcg/ml Appropriate antibiotic dosing for renal function; eradication of infection  Plan:  Start Vancomycin 500mg  IV Q12H and Zosyn 3.375g IV Q8H extended infusion. Measure antibiotic drug levels at steady state Follow up culture results  Kizzie Furnish, PharmD Pager: [phone] 05/13/2014 7:47 PM

## 2014-05-13 NOTE — Progress Notes (Signed)
Held on line, called back and requested Charge Nurse for report

## 2014-05-13 NOTE — ED Provider Notes (Signed)
Medical screening examination/treatment/procedure(s) were conducted as a shared visit with non-physician practitioner(s) and myself.  I personally evaluated the patient during the encounter.   EKG Interpretation None     Patient here with hyperglycemia and likely bacteremia unknown source at this time. Blood pressure stable here. IV antibiotic started. Blood sugar treated with IV fluids. Will be admitted to the hospitalist service  Leota Jacobsen, MD 05/13/14 2008

## 2014-05-14 ENCOUNTER — Ambulatory Visit: Payer: Medicare Other | Admitting: Family Medicine

## 2014-05-14 ENCOUNTER — Inpatient Hospital Stay (HOSPITAL_COMMUNITY): Payer: Medicare Other

## 2014-05-14 DIAGNOSIS — D649 Anemia, unspecified: Secondary | ICD-10-CM

## 2014-05-14 DIAGNOSIS — M79609 Pain in unspecified limb: Secondary | ICD-10-CM

## 2014-05-14 DIAGNOSIS — E876 Hypokalemia: Secondary | ICD-10-CM

## 2014-05-14 DIAGNOSIS — D72819 Decreased white blood cell count, unspecified: Secondary | ICD-10-CM

## 2014-05-14 LAB — GLUCOSE, CAPILLARY
GLUCOSE-CAPILLARY: 85 mg/dL (ref 70–99)
GLUCOSE-CAPILLARY: 118 mg/dL — AB (ref 70–99)
Glucose-Capillary: 221 mg/dL — ABNORMAL HIGH (ref 70–99)
Glucose-Capillary: 182 mg/dL — ABNORMAL HIGH (ref 70–99)

## 2014-05-14 LAB — CBC
HCT: 31.4 % — ABNORMAL LOW (ref 39.0–52.0)
Hemoglobin: 10.9 g/dL — ABNORMAL LOW (ref 13.0–17.0)
MCH: 29.9 pg (ref 26.0–34.0)
MCHC: 34.7 g/dL (ref 30.0–36.0)
MCV: 86 fL (ref 78.0–100.0)
PLATELETS: 238 10*3/uL (ref 150–400)
RBC: 3.65 MIL/uL — ABNORMAL LOW (ref 4.22–5.81)
RDW: 12.3 % (ref 11.5–15.5)
WBC: 16.5 10*3/uL — AB (ref 4.0–10.5)

## 2014-05-14 LAB — URINE CULTURE

## 2014-05-14 LAB — BASIC METABOLIC PANEL
Anion gap: 10 (ref 5–15)
BUN: 6 mg/dL (ref 6–23)
CO2: 29 mEq/L (ref 19–32)
Calcium: 8.2 mg/dL — ABNORMAL LOW (ref 8.4–10.5)
Chloride: 92 mEq/L — ABNORMAL LOW (ref 96–112)
Creatinine, Ser: 0.61 mg/dL (ref 0.50–1.35)
GFR calc Af Amer: >90 mL/min (ref >90)
GLUCOSE: 86 mg/dL (ref 70–99)
Potassium: 3.3 mEq/L — ABNORMAL LOW (ref 3.7–5.3)
SODIUM: 131 meq/L — AB (ref 137–147)

## 2014-05-14 LAB — CK: Total CK: 70 U/L (ref 7–232)

## 2014-05-14 MED ORDER — INSULIN GLARGINE 100 UNIT/ML ~~LOC~~ SOLN
10.0000 [IU] | Freq: Every day | SUBCUTANEOUS | Status: DC
  Administered 2014-05-14 – 2014-05-15 (×2): 10 [IU] via SUBCUTANEOUS
  Filled 2014-05-14 (×4): qty 0.1

## 2014-05-14 MED ORDER — GADOBENATE DIMEGLUMINE 529 MG/ML IV SOLN
10.0000 mL | Freq: Once | INTRAVENOUS | Status: AC | PRN
  Administered 2014-05-14: 10 mL via INTRAVENOUS

## 2014-05-14 MED ORDER — LIVING WELL WITH DIABETES BOOK
Freq: Once | Status: AC
  Administered 2014-05-14: 16:00:00
  Filled 2014-05-14: qty 1

## 2014-05-14 MED ORDER — SACCHAROMYCES BOULARDII 250 MG PO CAPS
250.0000 mg | ORAL_CAPSULE | Freq: Two times a day (BID) | ORAL | Status: DC
  Administered 2014-05-14 – 2014-05-15 (×4): 250 mg via ORAL
  Filled 2014-05-14 (×7): qty 1

## 2014-05-14 MED ORDER — BIOTENE DRY MOUTH MT LIQD
15.0000 mL | Freq: Two times a day (BID) | OROMUCOSAL | Status: DC
  Administered 2014-05-14 – 2014-05-15 (×3): 15 mL via OROMUCOSAL

## 2014-05-14 MED ORDER — POTASSIUM CHLORIDE CRYS ER 20 MEQ PO TBCR
20.0000 meq | EXTENDED_RELEASE_TABLET | Freq: Once | ORAL | Status: AC
  Administered 2014-05-14: 20 meq via ORAL
  Filled 2014-05-14: qty 1

## 2014-05-14 MED ORDER — PNEUMOCOCCAL VAC POLYVALENT 25 MCG/0.5ML IJ INJ
0.5000 mL | INJECTION | INTRAMUSCULAR | Status: DC
  Filled 2014-05-14 (×2): qty 0.5

## 2014-05-14 NOTE — Progress Notes (Addendum)
[facility] PROGRESS NOTE  Brian Owen [id number] DOB: [birth year] DOA: 05/13/2014 PCP: No PCP Per Patient  Assessment/Plan  Sepsis (fever, leukocytosis, tachycardia) possibly due to right thigh cellulitis vs. As-yet unidentified source, present at the time of admission  -  CXR and UA unremarkable -  CK 70 -  MR right thigh pending -  Duplex RLE pending -  F/u Blood cultures - Continue vancomycin and Zosyn day 2  Diabetes mellitus type 2, uncontrolled.  AM CBG mildly low, but received a hefty dose of aspart late last night which may have driven down farther than expected for lantus dose.  Will keep the same for now.  -Hemoglobin A1c 16.0--04/25/2014  - start lantus and novolog sliding scale   Hx of tobacco use with presumptive COPD  -compensated  Hyponatremia, partly due to volume depletion and improving -IVF   Hypokalemia due to poor oral intake  -  Oral repletion  Leukocytosis likely secondary to underlying infection, trending up despite antibiotics, but temperature coming down -  Repeat in AM  Normocytic anemia may be partly hemodilutional - iron studies, b12, folate, TSH in AM  Diet:  Diabetic/healthy heart Access:  PIV IVF:  yes Proph:  lovenox  Code Status: full Family Communication: patient alone Disposition Plan: transfer to med-surg   Consultants:  none  Procedures:  CXR  Antibiotics:  Vancomycin 7/16 >>  zosyn 7/16 >>   HPI/Subjective:  Denies HA, sore throat, cough, SOB, abdominal pain, N,V.  No recent diarrhea.  Right leg has been hurting behind the knee.      Objective: Filed Vitals:   05/14/14 0100 05/14/14 0200 05/14/14 0300 05/14/14 0400  BP:    144/64  Pulse: 102 96 91 87  Temp:    98.6 F (37 C)  TempSrc:    Oral  Resp: 22 19 19 21   Height:      Weight:   45.5 kg (100 lb 5 oz)   SpO2: 97% 98% 98% 98%    Intake/Output Summary (Last 24 hours) at 05/14/14 0739 Last data filed at 05/14/14 0400  Gross per 24 hour   Intake 760.42 ml  Output    750 ml  Net  10.42 ml   Filed Weights   05/13/14 1840 05/13/14 2149 05/14/14 0300  Weight: 47.628 kg (105 lb) 45.5 kg (100 lb 5 oz) 45.5 kg (100 lb 5 oz)    Exam:   General:  Thin adult male, No acute distress  HEENT:  NCAT, MMM  Cardiovascular:  RRR, nl S1, S2 no mrg, 2+ pulses, warm extremities  Respiratory:  CTAB, no increased WOB  Abdomen:   NABS, soft, NT/ND  MSK:   Normal tone and bulk, no LEE.  Minimal light pink erythema behind the right knee and extending in a palm-sized area superiorly without induration or warmth.  No fluctuant areas.    Neuro:  Grossly intact  Data Reviewed: Basic Metabolic Panel:  Recent Labs Lab 05/10/14 0222 05/13/14 1834 05/14/14 0402  NA 130* 123* 131*  K 3.9 4.2 3.3*  CL 86* 83* 92*  CO2 30 27 29   GLUCOSE 434* 614* 86  BUN 11 12 6   CREATININE 0.65 0.62 0.61  CALCIUM 8.9 9.2 8.2*   Liver Function Tests:  Recent Labs Lab 05/13/14 1834  AST 10  ALT 8  ALKPHOS 132*  BILITOT 0.4  PROT 7.9  ALBUMIN 3.0*   No results found for this basename: LIPASE, AMYLASE,  in the last 168 hours No results found  for this basename: AMMONIA,  in the last 168 hours CBC:  Recent Labs Lab 05/10/14 0222 05/13/14 1834 05/14/14 0402  WBC 13.3* 13.3* 16.5*  NEUTROABS 11.1* 11.3*  --   HGB 12.4* 11.1* 10.9*  HCT 35.2* 31.7* 31.4*  MCV 86.3 86.1 86.0  PLT 300 287 238   Cardiac Enzymes:  Recent Labs Lab 05/14/14 0402  CKTOTAL 70   BNP (last 3 results) No results found for this basename: PROBNP,  in the last 8760 hours CBG:  Recent Labs Lab 05/10/14 0507 05/10/14 0511 05/13/14 1817 05/13/14 2030 05/13/14 2203  GLUCAP >600* 314* 594* 399* 410*    Recent Results (from the past 240 hour(s))  MRSA PCR SCREENING     Status: None   Collection Time    05/13/14 10:04 PM      Result Value Ref Range Status   MRSA by PCR NEGATIVE  NEGATIVE Final   Comment:            The GeneXpert MRSA Assay (FDA      approved for NASAL specimens     only), is one component of a     comprehensive MRSA colonization     surveillance program. It is not     intended to diagnose MRSA     infection nor to guide or     monitor treatment for     MRSA infections.     Studies: Dg Chest Port 1 View  05/13/2014   CLINICAL DATA:  Hyperglycemia  EXAM: PORTABLE CHEST - 1 VIEW  COMPARISON:  06/30/2008  FINDINGS: Numerous leads and wires project over the chest. Midline trachea. Normal heart size. Transverse aortic atherosclerosis. No pleural effusion or pneumothorax. Diffuse peribronchial thickening. Clear lungs.  IMPRESSION: No acute cardiopulmonary disease.  Peribronchial thickening which may relate to chronic bronchitis or smoking.  Atherosclerosis.   Electronically Signed   By: Abigail Miyamoto M.D.   On: 05/13/2014 19:03    Scheduled Meds: . antiseptic oral rinse  15 mL Mouth Rinse BID  . enoxaparin (LOVENOX) injection  40 mg Subcutaneous QHS  . insulin aspart  0-5 Units Subcutaneous QHS  . insulin aspart  0-9 Units Subcutaneous TID WC  . insulin glargine  15 Units Subcutaneous QHS  . piperacillin-tazobactam (ZOSYN)  IV  3.375 g Intravenous 3 times per day  . potassium chloride  20 mEq Oral Once  . sodium chloride  3 mL Intravenous Q12H  . vancomycin  500 mg Intravenous Q12H   Continuous Infusions: . sodium chloride Stopped (05/13/14 2230)  . sodium chloride 0.9 % 1,000 mL with potassium chloride 20 mEq infusion 125 mL/hr at 05/13/14 2230    Active Problems:   Sepsis affecting skin   Sepsis   Cellulitis, leg   Hyponatremia   Diabetes mellitus type 2, uncontrolled    Time spent: 30 min    Zae Kirtz, [facility]  [facility] Pager [phone]. If 7PM-7AM, please contact night-coverage at [email or web address], password [facility] 05/14/2014, 7:39 AM  LOS: 1 day

## 2014-05-14 NOTE — Progress Notes (Signed)
CARE MANAGEMENT NOTE 05/14/2014  Patient:  Brian Owen,Brian Owen   Account Number:  [id number]  Date Initiated:  05/14/2014  Documentation initiated by:  Olga Coaster  Subjective/Objective Assessment:   ADMITTED WITH SEPSIS     Action/Plan:   CM FOLLOWING FOR DCP   Anticipated DC Date:  05/21/2014   Anticipated DC Plan:  [place]  CM consult       Status of service:  In process, will continue to follow Medicare Important Message given?   (If response is "NO", the following Medicare IM given date fields will be blank)  Per UR Regulation:  Reviewed for med. necessity/level of care/duration of stay  Comments:  7/17/2015Mindi Slicker RN,BSN,MHA [phone]

## 2014-05-14 NOTE — Progress Notes (Addendum)
Inpatient Diabetes Program Recommendations  AACE/ADA: New Consensus Statement on Inpatient Glycemic Control (2013)  Target Ranges:  Prepandial:   less than 140 mg/dL      Peak postprandial:   less than 180 mg/dL (1-2 hours)      Critically ill patients:  140 - 180 mg/dL   Reason for Assessment: Elevated HgbA1C  Diabetes history: DM2 Outpatient Diabetes medications: metformin 850 mg bid Current orders for Inpatient glycemic control: Lantus 15 units QHS, Novolog sensitive tidwc  63 year old male with a history of uncontrolled diabetes mellitus presented with fevers, chills, and rigors on the morning of admission.  The patient was started on metformin when he was discharged from the emergency department in 04/25/2014.Marland Kitchen He has not followed up with primary care physician. Lab glucose 614 on admission. AM CBG mildly low with Novolog 1x dose of 15 units. Checks blood sugars at home.  Recommendations: Consider decreasing Lantus to 10 units QHS. Add Novolog sensitive HS correctin. Would benefit from OP Diabetes Education for elevated HgbA1C. Needs to f/u with PCP to manage DM.   If pt to go home on insulin, please order insulin starter kit and RN to begin teaching insulin administration.  Will continue to follow. Thank you. Lorenda Peck, RD, LDN, CDE Inpatient Diabetes Coordinator [phone]

## 2014-05-14 NOTE — Progress Notes (Signed)
VASCULAR LAB PRELIMINARY  PRELIMINARY  PRELIMINARY  PRELIMINARY  Right lower extremity venous duplex completed.    Preliminary report:  Right:  No evidence of DVT, superficial thrombosis, or Baker's cyst.    In the posterior distal thigh, at area of pain, there is a vascularized mass greater than 4 x4 cm.  It is too large to accurately measure.  Brian Owen, RVT 05/14/2014, 9:13 AM

## 2014-05-14 NOTE — Plan of Care (Signed)
Problem: Consults Goal: Diagnosis-Diabetes Mellitus Outcome: Completed/Met Date Met:  05/14/14 Hyperglycemia

## 2014-05-14 NOTE — Progress Notes (Signed)
Nutrition Brief Note  Patient identified on the Malnutrition Screening Tool (MST) Report  Wt Readings from Last 15 Encounters:  05/14/14 100 lb 5 oz (45.5 kg)    Body mass index is 19.59 kg/(m^2). Patient meets criteria for Normal weight based on current BMI.   Current diet order is Carb Modified, patient is consuming approximately 100% of meals at this time. Labs and medications reviewed.   Pt denied any changes in weights or appetite, however continues to be confused. Discussed pt with Nurse Tech. Nurse Tech reported pt ate well at breakfast, > 75% of meals. Had declined to order lunch he thought it was 1AM; but NT planned to re-attempt to offer lunch later in afternoon. Attempted to contact pt's wife for additional information; however was unable to reach.  No nutrition interventions warranted at this time. If nutrition issues arise, please consult RD.   Atlee Abide MS RD LDN Clinical Dietitian [phone]

## 2014-05-15 ENCOUNTER — Encounter (HOSPITAL_COMMUNITY): Payer: Self-pay

## 2014-05-15 ENCOUNTER — Inpatient Hospital Stay (HOSPITAL_COMMUNITY): Payer: Medicare Other

## 2014-05-15 DIAGNOSIS — A419 Sepsis, unspecified organism: Secondary | ICD-10-CM

## 2014-05-15 DIAGNOSIS — D649 Anemia, unspecified: Secondary | ICD-10-CM

## 2014-05-15 DIAGNOSIS — R7881 Bacteremia: Secondary | ICD-10-CM

## 2014-05-15 DIAGNOSIS — B9689 Other specified bacterial agents as the cause of diseases classified elsewhere: Secondary | ICD-10-CM

## 2014-05-15 DIAGNOSIS — A4901 Methicillin susceptible Staphylococcus aureus infection, unspecified site: Secondary | ICD-10-CM

## 2014-05-15 DIAGNOSIS — L089 Local infection of the skin and subcutaneous tissue, unspecified: Secondary | ICD-10-CM

## 2014-05-15 LAB — CBC
HCT: 29.5 % — ABNORMAL LOW (ref 39.0–52.0)
Hemoglobin: 10.4 g/dL — ABNORMAL LOW (ref 13.0–17.0)
MCH: 30.1 pg (ref 26.0–34.0)
MCHC: 35.3 g/dL (ref 30.0–36.0)
MCV: 85.3 fL (ref 78.0–100.0)
PLATELETS: 237 10*3/uL (ref 150–400)
RBC: 3.46 MIL/uL — AB (ref 4.22–5.81)
RDW: 12.5 % (ref 11.5–15.5)
WBC: 10.2 10*3/uL (ref 4.0–10.5)

## 2014-05-15 LAB — APTT: aPTT: 43 seconds — ABNORMAL HIGH (ref 24–37)

## 2014-05-15 LAB — BASIC METABOLIC PANEL
ANION GAP: 11 (ref 5–15)
BUN: 7 mg/dL (ref 6–23)
CO2: 26 mEq/L (ref 19–32)
Calcium: 8.6 mg/dL (ref 8.4–10.5)
Chloride: 95 mEq/L — ABNORMAL LOW (ref 96–112)
Creatinine, Ser: 0.62 mg/dL (ref 0.50–1.35)
Glucose, Bld: 152 mg/dL — ABNORMAL HIGH (ref 70–99)
POTASSIUM: 3.9 meq/L (ref 3.7–5.3)
SODIUM: 132 meq/L — AB (ref 137–147)

## 2014-05-15 LAB — TSH: TSH: 0.578 u[IU]/mL (ref 0.350–4.500)

## 2014-05-15 LAB — GLUCOSE, CAPILLARY
GLUCOSE-CAPILLARY: 146 mg/dL — AB (ref 70–99)
GLUCOSE-CAPILLARY: 319 mg/dL — AB (ref 70–99)
Glucose-Capillary: 264 mg/dL — ABNORMAL HIGH (ref 70–99)
Glucose-Capillary: 292 mg/dL — ABNORMAL HIGH (ref 70–99)
Glucose-Capillary: 280 mg/dL — ABNORMAL HIGH (ref 70–99)

## 2014-05-15 LAB — IRON AND TIBC
IRON: 59 ug/dL (ref 42–135)
Saturation Ratios: 36 % (ref 20–55)
TIBC: 163 ug/dL — ABNORMAL LOW (ref 215–435)
UIBC: 104 ug/dL — ABNORMAL LOW (ref 125–400)

## 2014-05-15 LAB — FOLATE RBC: RBC FOLATE: 606 ng/mL — AB (ref >280)

## 2014-05-15 LAB — FERRITIN: Ferritin: 415 ng/mL — ABNORMAL HIGH (ref 22–322)

## 2014-05-15 LAB — PROTIME-INR
INR: 1.02 (ref 0.00–1.49)
Prothrombin Time: 13.4 seconds (ref 11.6–15.2)

## 2014-05-15 LAB — HIV ANTIBODY (ROUTINE TESTING W REFLEX): HIV: NONREACTIVE

## 2014-05-15 LAB — VITAMIN B12: Vitamin B-12: 361 pg/mL (ref 211–911)

## 2014-05-15 MED ORDER — MIDAZOLAM HCL 2 MG/2ML IJ SOLN
INTRAMUSCULAR | Status: AC
Start: 2014-05-15 — End: 2014-05-16
  Filled 2014-05-15: qty 4

## 2014-05-15 MED ORDER — FENTANYL CITRATE 0.05 MG/ML IJ SOLN
INTRAMUSCULAR | Status: AC | PRN
  Administered 2014-05-15 (×2): 50 ug via INTRAVENOUS

## 2014-05-15 MED ORDER — FENTANYL CITRATE 0.05 MG/ML IJ SOLN
INTRAMUSCULAR | Status: AC
  Filled 2014-05-15: qty 4

## 2014-05-15 MED ORDER — ACETAMINOPHEN 500 MG PO TABS: 1000.0000 mg | ORAL_TABLET | Freq: Once | ORAL | Status: DC

## 2014-05-15 MED ORDER — MIDAZOLAM HCL 2 MG/2ML IJ SOLN
INTRAMUSCULAR | Status: AC | PRN
  Administered 2014-05-15 (×2): 1 mg via INTRAVENOUS

## 2014-05-15 MED ORDER — CEFAZOLIN SODIUM-DEXTROSE 2-3 GM-% IV SOLR
2.0000 g | Freq: Three times a day (TID) | INTRAVENOUS | Status: DC
  Administered 2014-05-15 – 2014-05-16 (×3): 2 g via INTRAVENOUS
  Filled 2014-05-15 (×7): qty 50

## 2014-05-15 MED ORDER — INSULIN STARTER KIT- PEN NEEDLES (ENGLISH)
1.0000 | Freq: Once | Status: DC
  Filled 2014-05-15: qty 1

## 2014-05-15 MED ORDER — SODIUM CHLORIDE 0.9 % IV SOLN
INTRAVENOUS | Status: DC
Start: 2014-05-15 — End: 2014-05-16

## 2014-05-15 NOTE — Sedation Documentation (Signed)
Small fluid bolus from IV bag given. MD aware of decreased BP. Other vitals stable.

## 2014-05-15 NOTE — Consult Note (Signed)
ORTHOPAEDIC CONSULTATION  REQUESTING PHYSICIAN: Janece Canterbury, MD  Chief Complaint: BLE abscesses  HPI: Brian Owen is a 63 y.o. male who presented to the emergency room with sepsis insert for a source he was found to have fluid collections in bilateral musculature of his proximal legs aspiration was performed and pre-purulent fluid was obtained from both walled off load collections. I have transferred the conus are he had cases on for sunday for a I&D of both legs. I had a very lengthy discussion with the patient and his daughter and he wants to leave AMA.  Past Medical History  Diagnosis Date  . Diabetes mellitus without complication    No past surgical history on file. History   Social History  . Marital Status: Married    Spouse Name: N/A    Number of Children: N/A  . Years of Education: N/A   Social History Main Topics  . Smoking status: Current Every Day Smoker    Types: Cigarettes  . Smokeless tobacco: Not on file  . Alcohol Use: No  . Drug Use: No  . Sexual Activity: Not on file   Other Topics Concern  . Not on file   Social History Narrative  . No narrative on file   No family history on file. No Known Allergies Prior to Admission medications   Medication Sig Start Date End Date Taking? Authorizing Provider  loperamide (IMODIUM) 2 MG capsule Take 1 capsule (2 mg total) by mouth 4 (four) times daily as needed for diarrhea or loose stools. 05/10/14  Yes Kalman Drape, MD  metFORMIN (GLUCOPHAGE) 850 MG tablet Take 850 mg by mouth 2 (two) times daily with a meal. 04/25/14  Yes Virgel Manifold, MD   Mr Femur Right W Wo Contrast  05/15/2014   CLINICAL DATA:  Diabetes.  Cellulitis of the right upper leg.  EXAM: MRI OF THE RIGHT FEMUR WITHOUT AND WITH CONTRAST  TECHNIQUE: Multiplanar, multisequence MR imaging of the lower right extremity was performed both before and after administration of intravenous contrast. Because of abnormalities also observed in the left upper  legs, additional imaging of the left upper leg was performed.  CONTRAST:  68m MULTIHANCE GADOBENATE DIMEGLUMINE 529 MG/ML IV SOLN  COMPARISON:  None.  FINDINGS: Abnormal edema in the right distal semimembranosus muscle is observed with a 8.7 x 0.9 x 1.2 cm fluid collection tracking in the muscle. There is enhancement along the margins of this fluid collection. On the axial T1 weighted images, there may be some very faintly increased T1 signal in the region of rim enhancement.  There is low-level edema in the right distal adductor magnus and in the long head of the biceps femoris, and to a lesser extent in the distal sartorius. Surrounding subcutaneous edema is present along the margin of the posterior compartment.  On the contralateral side there is abnormal edema signal more proximally in the adductor magnus muscle and tracking along adjacent fascia planes, along with a 4.7 x 3.1 x 2.8 cm lesion with rather thick enhancing walls, internal nonenhancing fluid signal intensity, and some mild marginal increased T1 signal on precontrast non fat saturated images. Remaining posterior compartmental muscle groups on the left appear intact.  No significant abnormal underlying bony findings, aside from mild red marrow redistribution.  IMPRESSION: 1. Rim enhancing fluid collections in the right distal semimembranosus muscle and further proximally in the contralateral (left) adductor magnus muscle. On the right side, there is also some abnormal increased signal involving other  posterior compartmental musculature including the long head of the biceps femoris and sartorius. There is more fascia plane edema on the right than the left in the posterior compartment, and more subcutaneous edema on the right. Given the bilaterality and presence of mildly increased T1 signal along the rim of the fluid collections, and the absence of significant leukocytosis currently, I am suspicious for the possibility of diabetes associated  hemorrhagic muscular infarcts. Although I favor this diagnosis over abscess and myositis/fasciitis, ongoing correlation with specific indicators of infection is recommended in determining the need for drainage or fasciotomy.   Electronically Signed   By: Sherryl Barters M.D.   On: 05/15/2014 08:43   Dg Chest Port 1 View  05/13/2014   CLINICAL DATA:  Hyperglycemia  EXAM: PORTABLE CHEST - 1 VIEW  COMPARISON:  06/30/2008  FINDINGS: Numerous leads and wires project over the chest. Midline trachea. Normal heart size. Transverse aortic atherosclerosis. No pleural effusion or pneumothorax. Diffuse peribronchial thickening. Clear lungs.  IMPRESSION: No acute cardiopulmonary disease.  Peribronchial thickening which may relate to chronic bronchitis or smoking.  Atherosclerosis.   Electronically Signed   By: Abigail Miyamoto M.D.   On: 05/13/2014 19:03    Positive ROS: All other systems have been reviewed and were otherwise negative with the exception of those mentioned in the HPI and as above.  Labs cbc  Recent Labs  05/14/14 0402 05/15/14 0538  WBC 16.5* 10.2  HGB 10.9* 10.4*  HCT 31.4* 29.5*  PLT 238 237    Labs inflam No results found for this basename: ESR, CRP,  in the last 72 hours  Labs coag No results found for this basename: INR, PT, PTT,  in the last 72 hours   Recent Labs  05/14/14 0402 05/15/14 0538  NA 131* 132*  K 3.3* 3.9  CL 92* 95*  CO2 29 26  GLUCOSE 86 152*  BUN 6 7  CREATININE 0.61 0.62  CALCIUM 8.2* 8.6    Physical Exam: Filed Vitals:   05/15/14 0551  BP: 133/78  Pulse: 76  Temp: 98.2 F (36.8 C)  Resp: 16   General: Alert, no acute distress Cardiovascular: No pedal edema Respiratory: No cyanosis, no use of accessory musculature GI: No organomegaly, abdomen is soft and non-tender Skin: No lesions in the area of chief complaint Neurologic: Sensation intact distally Psychiatric: Patient is competent for consent with normal mood and affect Lymphatic: No  axillary or cervical lymphadenopathy  MUSCULOSKELETAL:  BLE: erythema in the posterior thigh of BLE,  SILT DP/SP/S/S/T nerve, 2+ DP, +TA/GS/EHL Compartments soft Painless ROM No Crepitous  Other extremities are atraumatic with painless ROM and NVI.  Assessment: BLE abscesses  Plan: I had him scheduled for irrigation and debridement of both of these abscesses. Patient reported to me this morning on 7/19 but he does not want the surgery and wants to go home. I then had a very lengthy discussion with him about the fact that I think he be able to go home a day or 2 after surgery as we would not be cutting any muscles or bones. Primarily split in muscles. He again voices no of the surgery. I cautioned him that he is very become septic from this once and I do not think he will get better with antibiotics I think he will become septic again and this could be fatal or have significant morbidity for him. He voiced understanding of this and said that he again does not want the surgery and wants to  go home today. He appears confident and seemed to understand.   I used an Research officer, political party and confirmed that patient understands all of this and gave them my office phone number and advised him to come to the ER should his symptoms worsen again.   Weight Bearing Status: WBAT VTE px: SCD's and chemical per the primary He should continue a course of PO abx at the minimum and he voiced that he is willing to do this.    Edmonia Lynch, D, MD Cell [phone]   05/15/2014 9:59 AM

## 2014-05-15 NOTE — Progress Notes (Signed)
  Echocardiogram 2D Echocardiogram has been performed.  Darlina Sicilian M 05/15/2014, 9:51 AM

## 2014-05-15 NOTE — H&P (Signed)
Agree.  Imaging reviewed.  Given pos blood cultures and appearance of thigh collections, these are most likely infected collections.  MRI shows mostly rim enhancement and inflammation w/o much central fluid.  Will try to aspirate both collections under [place] for diagnostic purposes and to aid in decision regarding additional surgical management.

## 2014-05-15 NOTE — H&P (Signed)
Brian Owen is an 63 y.o. male.   Chief Complaint: Pt had sudden onset weakness and shaking spell after a shower few days ago B leg pain; weakness; fever Comes to ED and admitted with leukocytosis B lower extremity cellulitis +BC gram + cocci MRI reveals B distal thigh small abscesses Request has been made for aspiration of these collections per [facility] Dr Sheran Fava Dr Kathlene Cote has reviewed imaging Pt has been seen and examined Now scheduled for aspiration If aspirate infectious- prob for debridement  HPI: DM  Past Medical History  Diagnosis Date  . Diabetes mellitus without complication     No past surgical history on file.  No family history on file. Social History:  reports that he has been smoking Cigarettes.  He has been smoking about 0.00 packs per day. He does not have any smokeless tobacco history on file. He reports that he does not drink alcohol or use illicit drugs.  Allergies: No Known Allergies  Medications Prior to Admission  Medication Sig Dispense Refill  . loperamide (IMODIUM) 2 MG capsule Take 1 capsule (2 mg total) by mouth 4 (four) times daily as needed for diarrhea or loose stools.  60 capsule  0  . metFORMIN (GLUCOPHAGE) 850 MG tablet Take 850 mg by mouth 2 (two) times daily with a meal.        Results for orders placed during the hospital encounter of 05/13/14 (from the past 48 hour(s))  CBG MONITORING, ED     Status: Abnormal   Collection Time    05/13/14  6:17 PM      Result Value Ref Range   Glucose-Capillary 594 (*) 70 - 99 mg/dL   Comment 1 Notify RN    CULTURE, BLOOD (ROUTINE X 2)     Status: None   Collection Time    05/13/14  6:24 PM      Result Value Ref Range   Specimen Description BLOOD RIGHT HAND     Special Requests BOTTLES DRAWN AEROBIC AND ANAEROBIC 5ML     Culture  Setup Time       Value: 05/13/2014 22:30     Performed at [facility]   Culture       Value: STAPHYLOCOCCUS AUREUS     Note: RIFAMPIN AND GENTAMICIN SHOULD NOT BE  USED AS SINGLE DRUGS FOR TREATMENT OF STAPH INFECTIONS.     Note: Gram Stain Report Called to,Read Back By and Verified With: CAROLYN HOLLAND 05/14/14 1330 BY SMITHERSJ     Performed at [facility]   Report Status PENDING    URINALYSIS, ROUTINE W REFLEX MICROSCOPIC     Status: Abnormal   Collection Time    05/13/14  6:27 PM      Result Value Ref Range   Color, Urine YELLOW  YELLOW   APPearance CLEAR  CLEAR   Specific Gravity, Urine 1.030  1.005 - 1.030   pH 7.0  5.0 - 8.0   Glucose, UA >1000 (*) NEGATIVE mg/dL   Hgb urine dipstick TRACE (*) NEGATIVE   Bilirubin Urine NEGATIVE  NEGATIVE   Ketones, ur NEGATIVE  NEGATIVE mg/dL   Protein, ur NEGATIVE  NEGATIVE mg/dL   Urobilinogen, UA 0.2  0.0 - 1.0 mg/dL   Nitrite NEGATIVE  NEGATIVE   Leukocytes, UA NEGATIVE  NEGATIVE  URINE CULTURE     Status: None   Collection Time    05/13/14  6:27 PM      Result Value Ref Range   Specimen Description URINE,  RANDOM     Special Requests NONE     Culture  Setup Time       Value: 05/13/2014 23:34     Performed at [facility]       Value: >=100,000 COLONIES/ML     Performed at [facility]   Culture       Value: Multiple bacterial morphotypes present, none predominant. Suggest appropriate recollection if clinically indicated.     Performed at [facility]   Report Status 05/14/2014 FINAL    URINE MICROSCOPIC-ADD ON     Status: None   Collection Time    05/13/14  6:27 PM      Result Value Ref Range   Squamous Epithelial / LPF RARE  RARE   WBC, UA 0-2  <3 WBC/hpf   RBC / HPF 7-10  <3 RBC/hpf   Bacteria, UA RARE  RARE  CBC WITH DIFFERENTIAL     Status: Abnormal   Collection Time    05/13/14  6:34 PM      Result Value Ref Range   WBC 13.3 (*) 4.0 - 10.5 K/uL   RBC 3.68 (*) 4.22 - 5.81 MIL/uL   Hemoglobin 11.1 (*) 13.0 - 17.0 g/dL   HCT 31.7 (*) 39.0 - 52.0 %   MCV 86.1  78.0 - 100.0 fL   MCH 30.2  26.0 - 34.0 pg   MCHC 35.0  30.0 - 36.0  g/dL   RDW 12.3  11.5 - 15.5 %   Platelets 287  150 - 400 K/uL   Neutrophils Relative % 85 (*) 43 - 77 %   Neutro Abs 11.3 (*) 1.7 - 7.7 K/uL   Lymphocytes Relative 10 (*) 12 - 46 %   Lymphs Abs 1.3  0.7 - 4.0 K/uL   Monocytes Relative 4  3 - 12 %   Monocytes Absolute 0.6  0.1 - 1.0 K/uL   Eosinophils Relative 1  0 - 5 %   Eosinophils Absolute 0.1  0.0 - 0.7 K/uL   Basophils Relative 0  0 - 1 %   Basophils Absolute 0.0  0.0 - 0.1 K/uL  COMPREHENSIVE METABOLIC PANEL     Status: Abnormal   Collection Time    05/13/14  6:34 PM      Result Value Ref Range   Sodium 123 (*) 137 - 147 mEq/L   Potassium 4.2  3.7 - 5.3 mEq/L   Chloride 83 (*) 96 - 112 mEq/L   CO2 27  19 - 32 mEq/L   Glucose, Bld 614 (*) 70 - 99 mg/dL   Comment: CRITICAL RESULT CALLED TO, READ BACK BY AND VERIFIED WITH:     S WEST RN 1914 05/13/14 A NAVARRO   BUN 12  6 - 23 mg/dL   Creatinine, Ser 0.62  0.50 - 1.35 mg/dL   Calcium 9.2  8.4 - 10.5 mg/dL   Total Protein 7.9  6.0 - 8.3 g/dL   Albumin 3.0 (*) 3.5 - 5.2 g/dL   AST 10  0 - 37 U/L   ALT 8  0 - 53 U/L   Alkaline Phosphatase 132 (*) 39 - 117 U/L   Total Bilirubin 0.4  0.3 - 1.2 mg/dL   GFR calc non Af Amer >90  >90 mL/min   GFR calc Af Amer >90  >90 mL/min   Comment: (NOTE)     The eGFR has been calculated using the CKD EPI equation.     This  calculation has not been validated in all clinical situations.     eGFR's persistently <90 mL/min signify possible Chronic Kidney     Disease.   Anion gap 13  5 - 15  I-STAT CG4 LACTIC ACID, ED     Status: None   Collection Time    05/13/14  6:39 PM      Result Value Ref Range   Lactic Acid, Venous 2.09  0.5 - 2.2 mmol/L  CULTURE, BLOOD (ROUTINE X 2)     Status: None   Collection Time    05/13/14  6:44 PM      Result Value Ref Range   Specimen Description BLOOD LEFT HAND     Special Requests BOTTLES DRAWN AEROBIC AND ANAEROBIC 5ML     Culture  Setup Time       Value: 05/13/2014 22:31     Performed at [facility]   Culture       Value: STAPHYLOCOCCUS AUREUS     Note: Gram Stain Report Called to,Read Back By and Verified With: CAROLYN HOLLAND 05/14/14 1330 BY SMITHERSJ     Performed at [facility]   Report Status PENDING    I-STAT CG4 LACTIC ACID, ED     Status: None   Collection Time    05/13/14  8:07 PM      Result Value Ref Range   Lactic Acid, Venous 0.84  0.5 - 2.2 mmol/L  CBG MONITORING, ED     Status: Abnormal   Collection Time    05/13/14  8:30 PM      Result Value Ref Range   Glucose-Capillary 399 (*) 70 - 99 mg/dL  GLUCOSE, CAPILLARY     Status: Abnormal   Collection Time    05/13/14 10:03 PM      Result Value Ref Range   Glucose-Capillary 410 (*) 70 - 99 mg/dL   Comment 1 Notify RN    MRSA PCR SCREENING     Status: None   Collection Time    05/13/14 10:04 PM      Result Value Ref Range   MRSA by PCR NEGATIVE  NEGATIVE   Comment:            The GeneXpert MRSA Assay (FDA     approved for NASAL specimens     only), is one component of a     comprehensive MRSA colonization     surveillance program. It is not     intended to diagnose MRSA     infection nor to guide or     monitor treatment for     MRSA infections.  BASIC METABOLIC PANEL     Status: Abnormal   Collection Time    05/14/14  4:02 AM      Result Value Ref Range   Sodium 131 (*) 137 - 147 mEq/L   Comment: DELTA CHECK NOTED     REPEATED TO VERIFY   Potassium 3.3 (*) 3.7 - 5.3 mEq/L   Comment: DELTA CHECK NOTED     REPEATED TO VERIFY   Chloride 92 (*) 96 - 112 mEq/L   Comment: DELTA CHECK NOTED     REPEATED TO VERIFY   CO2 29  19 - 32 mEq/L   Glucose, Bld 86  70 - 99 mg/dL   BUN 6  6 - 23 mg/dL   Creatinine, Ser 0.61  0.50 - 1.35 mg/dL   Calcium 8.2 (*) 8.4 - 10.5 mg/dL   GFR calc non  Af Amer >90  >90 mL/min   GFR calc Af Amer >90  >90 mL/min   Comment: (NOTE)     The eGFR has been calculated using the CKD EPI equation.     This calculation has not been validated in all clinical  situations.     eGFR's persistently <90 mL/min signify possible Chronic Kidney     Disease.   Anion gap 10  5 - 15  CBC     Status: Abnormal   Collection Time    05/14/14  4:02 AM      Result Value Ref Range   WBC 16.5 (*) 4.0 - 10.5 K/uL   RBC 3.65 (*) 4.22 - 5.81 MIL/uL   Hemoglobin 10.9 (*) 13.0 - 17.0 g/dL   HCT 31.4 (*) 39.0 - 52.0 %   MCV 86.0  78.0 - 100.0 fL   MCH 29.9  26.0 - 34.0 pg   MCHC 34.7  30.0 - 36.0 g/dL   RDW 12.3  11.5 - 15.5 %   Platelets 238  150 - 400 K/uL  CK     Status: None   Collection Time    05/14/14  4:02 AM      Result Value Ref Range   Total CK 70  7 - 232 U/L  GLUCOSE, CAPILLARY     Status: None   Collection Time    05/14/14  7:58 AM      Result Value Ref Range   Glucose-Capillary 85  70 - 99 mg/dL  GLUCOSE, CAPILLARY     Status: Abnormal   Collection Time    05/14/14 12:01 PM      Result Value Ref Range   Glucose-Capillary 118 (*) 70 - 99 mg/dL   Comment 1 Documented in Chart     Comment 2 Notify RN    GLUCOSE, CAPILLARY     Status: Abnormal   Collection Time    05/14/14  4:51 PM      Result Value Ref Range   Glucose-Capillary 221 (*) 70 - 99 mg/dL   Comment 1 Documented in Chart     Comment 2 Notify RN    GLUCOSE, CAPILLARY     Status: Abnormal   Collection Time    05/14/14 10:59 PM      Result Value Ref Range   Glucose-Capillary 182 (*) 70 - 99 mg/dL   Comment 1 Notify RN    BASIC METABOLIC PANEL     Status: Abnormal   Collection Time    05/15/14  5:38 AM      Result Value Ref Range   Sodium 132 (*) 137 - 147 mEq/L   Potassium 3.9  3.7 - 5.3 mEq/L   Chloride 95 (*) 96 - 112 mEq/L   CO2 26  19 - 32 mEq/L   Glucose, Bld 152 (*) 70 - 99 mg/dL   BUN 7  6 - 23 mg/dL   Creatinine, Ser 0.62  0.50 - 1.35 mg/dL   Calcium 8.6  8.4 - 10.5 mg/dL   GFR calc non Af Amer >90  >90 mL/min   GFR calc Af Amer >90  >90 mL/min   Comment: (NOTE)     The eGFR has been calculated using the CKD EPI equation.     This calculation has not been  validated in all clinical situations.     eGFR's persistently <90 mL/min signify possible Chronic Kidney     Disease.   Anion gap 11  5 - 15  CBC  Status: Abnormal   Collection Time    05/15/14  5:38 AM      Result Value Ref Range   WBC 10.2  4.0 - 10.5 K/uL   RBC 3.46 (*) 4.22 - 5.81 MIL/uL   Hemoglobin 10.4 (*) 13.0 - 17.0 g/dL   HCT 29.5 (*) 39.0 - 52.0 %   MCV 85.3  78.0 - 100.0 fL   MCH 30.1  26.0 - 34.0 pg   MCHC 35.3  30.0 - 36.0 g/dL   RDW 12.5  11.5 - 15.5 %   Platelets 237  150 - 400 K/uL  GLUCOSE, CAPILLARY     Status: Abnormal   Collection Time    05/15/14  8:06 AM      Result Value Ref Range   Glucose-Capillary 146 (*) 70 - 99 mg/dL   Mr Femur Right W Wo Contrast  05/15/2014   CLINICAL DATA:  Diabetes.  Cellulitis of the right upper leg.  EXAM: MRI OF THE RIGHT FEMUR WITHOUT AND WITH CONTRAST  TECHNIQUE: Multiplanar, multisequence MR imaging of the lower right extremity was performed both before and after administration of intravenous contrast. Because of abnormalities also observed in the left upper legs, additional imaging of the left upper leg was performed.  CONTRAST:  56m MULTIHANCE GADOBENATE DIMEGLUMINE 529 MG/ML IV SOLN  COMPARISON:  None.  FINDINGS: Abnormal edema in the right distal semimembranosus muscle is observed with a 8.7 x 0.9 x 1.2 cm fluid collection tracking in the muscle. There is enhancement along the margins of this fluid collection. On the axial T1 weighted images, there may be some very faintly increased T1 signal in the region of rim enhancement.  There is low-level edema in the right distal adductor magnus and in the long head of the biceps femoris, and to a lesser extent in the distal sartorius. Surrounding subcutaneous edema is present along the margin of the posterior compartment.  On the contralateral side there is abnormal edema signal more proximally in the adductor magnus muscle and tracking along adjacent fascia planes, along with a 4.7 x  3.1 x 2.8 cm lesion with rather thick enhancing walls, internal nonenhancing fluid signal intensity, and some mild marginal increased T1 signal on precontrast non fat saturated images. Remaining posterior compartmental muscle groups on the left appear intact.  No significant abnormal underlying bony findings, aside from mild red marrow redistribution.  IMPRESSION: 1. Rim enhancing fluid collections in the right distal semimembranosus muscle and further proximally in the contralateral (left) adductor magnus muscle. On the right side, there is also some abnormal increased signal involving other posterior compartmental musculature including the long head of the biceps femoris and sartorius. There is more fascia plane edema on the right than the left in the posterior compartment, and more subcutaneous edema on the right. Given the bilaterality and presence of mildly increased T1 signal along the rim of the fluid collections, and the absence of significant leukocytosis currently, I am suspicious for the possibility of diabetes associated hemorrhagic muscular infarcts. Although I favor this diagnosis over abscess and myositis/fasciitis, ongoing correlation with specific indicators of infection is recommended in determining the need for drainage or fasciotomy.   Electronically Signed   By: WSherryl BartersM.D.   On: 05/15/2014 08:43   Dg Chest Port 1 View  05/13/2014   CLINICAL DATA:  Hyperglycemia  EXAM: PORTABLE CHEST - 1 VIEW  COMPARISON:  06/30/2008  FINDINGS: Numerous leads and wires project over the chest. Midline trachea. Normal heart size. Transverse  aortic atherosclerosis. No pleural effusion or pneumothorax. Diffuse peribronchial thickening. Clear lungs.  IMPRESSION: No acute cardiopulmonary disease.  Peribronchial thickening which may relate to chronic bronchitis or smoking.  Atherosclerosis.   Electronically Signed   By: Abigail Miyamoto M.D.   On: 05/13/2014 19:03    Review of Systems  Constitutional:  Negative for fever.  Respiratory: Negative for shortness of breath.   Cardiovascular: Negative for chest pain.  Gastrointestinal: Negative for nausea, vomiting and abdominal pain.  Musculoskeletal: Positive for joint pain.       B leg pain - resolving  Neurological: Positive for weakness. Negative for dizziness.    Blood pressure 133/78, pulse 76, temperature 98.2 F (36.8 C), temperature source Oral, resp. rate 16, height 5' (1.524 m), weight 45.5 kg (100 lb 5 oz), SpO2 97.00%. Physical Exam  Constitutional: He is oriented to person, place, and time. He appears well-developed.  Cardiovascular: Normal rate.   No murmur heard. Respiratory: Effort normal. He has no wheezes.  GI: There is no tenderness.  Musculoskeletal: Normal range of motion. He exhibits tenderness.  B distal thigh redness  Neurological: He is alert and oriented to person, place, and time.  Speaks English--not fluent  Skin: Skin is warm and dry. There is erythema.  Psychiatric: He has a normal mood and affect. His behavior is normal. Judgment and thought content normal.     Assessment/Plan B leg pain; weakness; shaking spell; fever Admitted with leukocytosis +BC: GPC MRI reveals B distal thigh fluid collections Now scheduled for aspiration of both in [place] today. Pt and family aware of procedure benefits and risks and agreeable to proceed Consent signed andin chart  Tarini Carrier A 05/15/2014, 10:24 AM

## 2014-05-15 NOTE — Consult Note (Signed)
[facility]  Total days of antibiotics 3        Day 3 vanco        Day 1 cefazolin        Day 2 piptazo       Reason for Consult: staph aureus bacteremia    Referring Physician: short  Active Problems:   Sepsis affecting skin   Sepsis   Cellulitis, leg   Hyponatremia   Diabetes mellitus type 2, uncontrolled   Leukocytopenia, unspecified   Normocytic anemia   Hypokalemia   Gram-positive cocci bacteremia   Septicemia    HPI: Brian Owen is a 63 y.o. male  with uncontrolled diabetes mellitus presented on 7/16 with fevers, chills, and rigors he had recent ground level  fall approximately 3 days prior to admission resulting in a scalp laceration/hematoma. Staples were placed in the emergency department on 05/10/2014, ruled out head fracture, but found to have scalp hematoma. The patient complains of right thigh pain on the distal third of his posterior thigh. He first noticed the pain on the morning of admission. In the ED found to have SIRS with  temperature 100.46F,  heart rate of 117, WBC 13.  the patient was given 2 L normal saline and started on vancomycin and Zosyn. Blood cx showed staph aureus from admit blood cx in 2/2 sets on 7/16, repeat blood cx ngtd. He was found to have bilateral posterior thigh fluid collections that IR has drained on 7/18, with imaging showing that Left: 5.0 x 2.3 x 3.4 cm thick-walled and septated collection of posterior mid thigh yielded 10 mL of purulent fluid. Largely collapsed by [place] after aspiration. And Right 8.8 x 1.7 x 2.1 cm multiseptated and less mature collection of posterior distal thigh yielded 5 mL of purulent fluid. Smaller after aspiration, but not completely decompressed. Collected fluid sent separately for aerobic and anaerobic cultures/gram stains. Patient seen by Dr. Percell Miller who plans to do I x D of deep tissue abscess of thighs on 7/19. The patient reports feeling better.    Past Medical History  Diagnosis Date    . Diabetes mellitus without complication     Allergies: No Known Allergies  Current antibiotics:   MEDICATIONS: . antiseptic oral rinse  15 mL Mouth Rinse BID  .  ceFAZolin (ANCEF) IV  2 g Intravenous 3 times per day  . enoxaparin (LOVENOX) injection  40 mg Subcutaneous QHS  . fentaNYL      . insulin aspart  0-5 Units Subcutaneous QHS  . insulin aspart  0-9 Units Subcutaneous TID WC  . insulin glargine  10 Units Subcutaneous QHS  . insulin starter kit- pen needles  1 kit Other Once  . midazolam      . pneumococcal 23 valent vaccine  0.5 mL Intramuscular Tomorrow-1000  . saccharomyces boulardii  250 mg Oral BID  . sodium chloride  3 mL Intravenous Q12H  . vancomycin  500 mg Intravenous Q12H    History  Substance Use Topics  . Smoking status: Current Every Day Smoker    Types: Cigarettes  . Smokeless tobacco: Not on file  . Alcohol Use: No    No family history on file.   Review of Systems  Constitutional: + fever but negative for chills, diaphoresis, activity change, appetite change, fatigue and unexpected weight change.  HENT: Negative for congestion, sore throat, rhinorrhea, sneezing, trouble swallowing and sinus pressure.  Eyes: Negative for photophobia and visual disturbance.  Respiratory: Negative for  cough, chest tightness, shortness of breath, wheezing and stridor.  Cardiovascular: Negative for chest pain, palpitations and leg swelling.  Gastrointestinal: Negative for nausea, vomiting, abdominal pain, diarrhea, constipation, blood in stool, abdominal distention and anal bleeding.  Genitourinary: Negative for dysuria, hematuria, flank pain and difficulty urinating.  Musculoskeletal: +leg pain Skin: Negative for color change, pallor, rash and wound.  Neurological: Negative for dizziness, tremors, weakness and light-headedness.  Hematological: Negative for adenopathy. Does not bruise/bleed easily.  Psychiatric/Behavioral: Negative for behavioral problems, confusion,  sleep disturbance, dysphoric mood, decreased concentration and agitation.     OBJECTIVE: Temp:  [98.2 F (36.8 C)] 98.2 F (36.8 C) (07/18 0551) Pulse Rate:  [76-108] 84 (07/18 1630) Resp:  [8-28] 16 (07/18 1630) BP: (75-163)/(53-89) 100/62 mmHg (07/18 1630) SpO2:  [97 %-100 %] 99 % (07/18 1630)  Constitutional: He is oriented to person, place, and time. He appears well-developed and well-nourished. No distress.  HENT:  Mouth/Throat: Oropharynx is clear and moist. No oropharyngeal exudate.  Cardiovascular: Normal rate, regular rhythm and normal heart sounds. Exam reveals no gallop and no friction rub.  No murmur heard.  Pulmonary/Chest: Effort normal and breath sounds normal. No respiratory distress. He has no wheezes.  Abdominal: Soft. Bowel sounds are normal. He exhibits no distension. There is no tenderness.  Lymphadenopathy:  He has no cervical adenopathy.  Neurological: He is alert and oriented to person, place, and time.  Skin: mild erythema behind the right knee. No fluctuance. Bandaged from IR aspiration. Psychiatric: He has a normal mood and affect. His behavior is normal.    LABS: Results for orders placed during the hospital encounter of 05/13/14 (from the past 48 hour(s))  CULTURE, BLOOD (ROUTINE X 2)     Status: None   Collection Time    05/13/14  6:24 PM      Result Value Ref Range   Specimen Description BLOOD RIGHT HAND     Special Requests BOTTLES DRAWN AEROBIC AND ANAEROBIC 5ML     Culture  Setup Time       Value: 05/13/2014 22:30     Performed at [facility]   Culture       Value: STAPHYLOCOCCUS AUREUS     Note: RIFAMPIN AND GENTAMICIN SHOULD NOT BE USED AS SINGLE DRUGS FOR TREATMENT OF STAPH INFECTIONS.     Note: Gram Stain Report Called to,Read Back By and Verified With: CAROLYN HOLLAND 05/14/14 1330 BY SMITHERSJ     Performed at [facility]   Report Status PENDING    URINALYSIS, ROUTINE W REFLEX MICROSCOPIC     Status: Abnormal    Collection Time    05/13/14  6:27 PM      Result Value Ref Range   Color, Urine YELLOW  YELLOW   APPearance CLEAR  CLEAR   Specific Gravity, Urine 1.030  1.005 - 1.030   pH 7.0  5.0 - 8.0   Glucose, UA >1000 (*) NEGATIVE mg/dL   Hgb urine dipstick TRACE (*) NEGATIVE   Bilirubin Urine NEGATIVE  NEGATIVE   Ketones, ur NEGATIVE  NEGATIVE mg/dL   Protein, ur NEGATIVE  NEGATIVE mg/dL   Urobilinogen, UA 0.2  0.0 - 1.0 mg/dL   Nitrite NEGATIVE  NEGATIVE   Leukocytes, UA NEGATIVE  NEGATIVE  URINE CULTURE     Status: None   Collection Time    05/13/14  6:27 PM      Result Value Ref Range   Specimen Description URINE, RANDOM     Special Requests NONE  Culture  Setup Time       Value: 05/13/2014 23:34     Performed at [facility]       Value: >=100,000 COLONIES/ML     Performed at [facility]   Culture       Value: Multiple bacterial morphotypes present, none predominant. Suggest appropriate recollection if clinically indicated.     Performed at [facility]   Report Status 05/14/2014 FINAL    URINE MICROSCOPIC-ADD ON     Status: None   Collection Time    05/13/14  6:27 PM      Result Value Ref Range   Squamous Epithelial / LPF RARE  RARE   WBC, UA 0-2  <3 WBC/hpf   RBC / HPF 7-10  <3 RBC/hpf   Bacteria, UA RARE  RARE  CBC WITH DIFFERENTIAL     Status: Abnormal   Collection Time    05/13/14  6:34 PM      Result Value Ref Range   WBC 13.3 (*) 4.0 - 10.5 K/uL   RBC 3.68 (*) 4.22 - 5.81 MIL/uL   Hemoglobin 11.1 (*) 13.0 - 17.0 g/dL   HCT 31.7 (*) 39.0 - 52.0 %   MCV 86.1  78.0 - 100.0 fL   MCH 30.2  26.0 - 34.0 pg   MCHC 35.0  30.0 - 36.0 g/dL   RDW 12.3  11.5 - 15.5 %   Platelets 287  150 - 400 K/uL   Neutrophils Relative % 85 (*) 43 - 77 %   Neutro Abs 11.3 (*) 1.7 - 7.7 K/uL   Lymphocytes Relative 10 (*) 12 - 46 %   Lymphs Abs 1.3  0.7 - 4.0 K/uL   Monocytes Relative 4  3 - 12 %   Monocytes Absolute 0.6  0.1 - 1.0 K/uL    Eosinophils Relative 1  0 - 5 %   Eosinophils Absolute 0.1  0.0 - 0.7 K/uL   Basophils Relative 0  0 - 1 %   Basophils Absolute 0.0  0.0 - 0.1 K/uL  COMPREHENSIVE METABOLIC PANEL     Status: Abnormal   Collection Time    05/13/14  6:34 PM      Result Value Ref Range   Sodium 123 (*) 137 - 147 mEq/L   Potassium 4.2  3.7 - 5.3 mEq/L   Chloride 83 (*) 96 - 112 mEq/L   CO2 27  19 - 32 mEq/L   Glucose, Bld 614 (*) 70 - 99 mg/dL   Comment: CRITICAL RESULT CALLED TO, READ BACK BY AND VERIFIED WITH:     S WEST RN 1914 05/13/14 A NAVARRO   BUN 12  6 - 23 mg/dL   Creatinine, Ser 0.62  0.50 - 1.35 mg/dL   Calcium 9.2  8.4 - 10.5 mg/dL   Total Protein 7.9  6.0 - 8.3 g/dL   Albumin 3.0 (*) 3.5 - 5.2 g/dL   AST 10  0 - 37 U/L   ALT 8  0 - 53 U/L   Alkaline Phosphatase 132 (*) 39 - 117 U/L   Total Bilirubin 0.4  0.3 - 1.2 mg/dL   GFR calc non Af Amer >90  >90 mL/min   GFR calc Af Amer >90  >90 mL/min   Comment: (NOTE)     The eGFR has been calculated using the CKD EPI equation.     This calculation has not been validated in all clinical situations.  eGFR's persistently <90 mL/min signify possible Chronic Kidney     Disease.   Anion gap 13  5 - 15  I-STAT CG4 LACTIC ACID, ED     Status: None   Collection Time    05/13/14  6:39 PM      Result Value Ref Range   Lactic Acid, Venous 2.09  0.5 - 2.2 mmol/L  CULTURE, BLOOD (ROUTINE X 2)     Status: None   Collection Time    05/13/14  6:44 PM      Result Value Ref Range   Specimen Description BLOOD LEFT HAND     Special Requests BOTTLES DRAWN AEROBIC AND ANAEROBIC 5ML     Culture  Setup Time       Value: 05/13/2014 22:31     Performed at [facility]   Culture       Value: STAPHYLOCOCCUS AUREUS     Note: Gram Stain Report Called to,Read Back By and Verified With: CAROLYN HOLLAND 05/14/14 1330 BY SMITHERSJ     Performed at [facility]   Report Status PENDING    I-STAT CG4 LACTIC ACID, ED     Status: None   Collection  Time    05/13/14  8:07 PM      Result Value Ref Range   Lactic Acid, Venous 0.84  0.5 - 2.2 mmol/L  CBG MONITORING, ED     Status: Abnormal   Collection Time    05/13/14  8:30 PM      Result Value Ref Range   Glucose-Capillary 399 (*) 70 - 99 mg/dL  GLUCOSE, CAPILLARY     Status: Abnormal   Collection Time    05/13/14 10:03 PM      Result Value Ref Range   Glucose-Capillary 410 (*) 70 - 99 mg/dL   Comment 1 Notify RN    MRSA PCR SCREENING     Status: None   Collection Time    05/13/14 10:04 PM      Result Value Ref Range   MRSA by PCR NEGATIVE  NEGATIVE   Comment:            The GeneXpert MRSA Assay (FDA     approved for NASAL specimens     only), is one component of a     comprehensive MRSA colonization     surveillance program. It is not     intended to diagnose MRSA     infection nor to guide or     monitor treatment for     MRSA infections.  BASIC METABOLIC PANEL     Status: Abnormal   Collection Time    05/14/14  4:02 AM      Result Value Ref Range   Sodium 131 (*) 137 - 147 mEq/L   Comment: DELTA CHECK NOTED     REPEATED TO VERIFY   Potassium 3.3 (*) 3.7 - 5.3 mEq/L   Comment: DELTA CHECK NOTED     REPEATED TO VERIFY   Chloride 92 (*) 96 - 112 mEq/L   Comment: DELTA CHECK NOTED     REPEATED TO VERIFY   CO2 29  19 - 32 mEq/L   Glucose, Bld 86  70 - 99 mg/dL   BUN 6  6 - 23 mg/dL   Creatinine, Ser 0.61  0.50 - 1.35 mg/dL   Calcium 8.2 (*) 8.4 - 10.5 mg/dL   GFR calc non Af Amer >90  >90 mL/min   GFR calc Af Amer >90  >  90 mL/min   Comment: (NOTE)     The eGFR has been calculated using the CKD EPI equation.     This calculation has not been validated in all clinical situations.     eGFR's persistently <90 mL/min signify possible Chronic Kidney     Disease.   Anion gap 10  5 - 15  CBC     Status: Abnormal   Collection Time    05/14/14  4:02 AM      Result Value Ref Range   WBC 16.5 (*) 4.0 - 10.5 K/uL   RBC 3.65 (*) 4.22 - 5.81 MIL/uL   Hemoglobin 10.9  (*) 13.0 - 17.0 g/dL   HCT 31.4 (*) 39.0 - 52.0 %   MCV 86.0  78.0 - 100.0 fL   MCH 29.9  26.0 - 34.0 pg   MCHC 34.7  30.0 - 36.0 g/dL   RDW 12.3  11.5 - 15.5 %   Platelets 238  150 - 400 K/uL  CK     Status: None   Collection Time    05/14/14  4:02 AM      Result Value Ref Range   Total CK 70  7 - 232 U/L  GLUCOSE, CAPILLARY     Status: None   Collection Time    05/14/14  7:58 AM      Result Value Ref Range   Glucose-Capillary 85  70 - 99 mg/dL  GLUCOSE, CAPILLARY     Status: Abnormal   Collection Time    05/14/14 12:01 PM      Result Value Ref Range   Glucose-Capillary 118 (*) 70 - 99 mg/dL   Comment 1 Documented in Chart     Comment 2 Notify RN    CULTURE, BLOOD (SINGLE)     Status: None   Collection Time    05/14/14  4:20 PM      Result Value Ref Range   Specimen Description BLOOD RIGHT ARM     Special Requests BOTTLES DRAWN AEROBIC ONLY 10CC     Culture  Setup Time       Value: 05/14/2014 22:54     Performed at [facility]   Culture       Value:        BLOOD CULTURE RECEIVED NO GROWTH TO DATE CULTURE WILL BE HELD FOR 5 DAYS BEFORE ISSUING A FINAL NEGATIVE REPORT     Performed at [facility]   Report Status PENDING    GLUCOSE, CAPILLARY     Status: Abnormal   Collection Time    05/14/14  4:51 PM      Result Value Ref Range   Glucose-Capillary 221 (*) 70 - 99 mg/dL   Comment 1 Documented in Chart     Comment 2 Notify RN    GLUCOSE, CAPILLARY     Status: Abnormal   Collection Time    05/14/14 10:59 PM      Result Value Ref Range   Glucose-Capillary 182 (*) 70 - 99 mg/dL   Comment 1 Notify RN    IRON AND TIBC     Status: Abnormal   Collection Time    05/15/14  5:38 AM      Result Value Ref Range   Iron 59  42 - 135 ug/dL   TIBC 163 (*) 215 - 435 ug/dL   Saturation Ratios 36  20 - 55 %   UIBC 104 (*) 125 - 400 ug/dL   Comment: Performed at [facility]  FERRITIN     Status: Abnormal   Collection Time    05/15/14  5:38 AM       Result Value Ref Range   Ferritin 415 (*) 22 - 322 ng/mL   Comment: Performed at [facility]     Status: None   Collection Time    05/15/14  5:38 AM      Result Value Ref Range   Vitamin B-12 361  211 - 911 pg/mL   Comment: Performed at [facility]  TSH     Status: None   Collection Time    05/15/14  5:38 AM      Result Value Ref Range   TSH 0.578  0.350 - 4.500 uIU/mL   Comment: Performed at [facility] PANEL     Status: Abnormal   Collection Time    05/15/14  5:38 AM      Result Value Ref Range   Sodium 132 (*) 137 - 147 mEq/L   Potassium 3.9  3.7 - 5.3 mEq/L   Chloride 95 (*) 96 - 112 mEq/L   CO2 26  19 - 32 mEq/L   Glucose, Bld 152 (*) 70 - 99 mg/dL   BUN 7  6 - 23 mg/dL   Creatinine, Ser 0.62  0.50 - 1.35 mg/dL   Calcium 8.6  8.4 - 10.5 mg/dL   GFR calc non Af Amer >90  >90 mL/min   GFR calc Af Amer >90  >90 mL/min   Comment: (NOTE)     The eGFR has been calculated using the CKD EPI equation.     This calculation has not been validated in all clinical situations.     eGFR's persistently <90 mL/min signify possible Chronic Kidney     Disease.   Anion gap 11  5 - 15  CBC     Status: Abnormal   Collection Time    05/15/14  5:38 AM      Result Value Ref Range   WBC 10.2  4.0 - 10.5 K/uL   RBC 3.46 (*) 4.22 - 5.81 MIL/uL   Hemoglobin 10.4 (*) 13.0 - 17.0 g/dL   HCT 29.5 (*) 39.0 - 52.0 %   MCV 85.3  78.0 - 100.0 fL   MCH 30.1  26.0 - 34.0 pg   MCHC 35.3  30.0 - 36.0 g/dL   RDW 12.5  11.5 - 15.5 %   Platelets 237  150 - 400 K/uL  GLUCOSE, CAPILLARY     Status: Abnormal   Collection Time    05/15/14  8:06 AM      Result Value Ref Range   Glucose-Capillary 146 (*) 70 - 99 mg/dL  PROTIME-INR     Status: None   Collection Time    05/15/14 10:30 AM      Result Value Ref Range   Prothrombin Time 13.4  11.6 - 15.2 seconds   INR 1.02  0.00 - 1.49  APTT     Status: Abnormal   Collection Time    05/15/14 10:30  AM      Result Value Ref Range   aPTT 43 (*) 24 - 37 seconds   Comment:            IF BASELINE aPTT IS ELEVATED,     SUGGEST PATIENT RISK ASSESSMENT     BE USED TO DETERMINE APPROPRIATE     ANTICOAGULANT THERAPY.  GLUCOSE, CAPILLARY  Status: Abnormal   Collection Time    05/15/14 11:59 AM      Result Value Ref Range   Glucose-Capillary 264 (*) 70 - 99 mg/dL  GLUCOSE, CAPILLARY     Status: Abnormal   Collection Time    05/15/14  4:12 PM      Result Value Ref Range   Glucose-Capillary 292 (*) 70 - 99 mg/dL    MICRO: 7/16 blood cx staph aureus 7/17 blood cx pending 7/18 blood cx pending IMAGING: Mr Femur Right W Wo Contrast  05/15/2014   CLINICAL DATA:  Diabetes.  Cellulitis of the right upper leg.  EXAM: MRI OF THE RIGHT FEMUR WITHOUT AND WITH CONTRAST  TECHNIQUE: Multiplanar, multisequence MR imaging of the lower right extremity was performed both before and after administration of intravenous contrast. Because of abnormalities also observed in the left upper legs, additional imaging of the left upper leg was performed.  CONTRAST:  58m MULTIHANCE GADOBENATE DIMEGLUMINE 529 MG/ML IV SOLN  COMPARISON:  None.  FINDINGS: Abnormal edema in the right distal semimembranosus muscle is observed with a 8.7 x 0.9 x 1.2 cm fluid collection tracking in the muscle. There is enhancement along the margins of this fluid collection. On the axial T1 weighted images, there may be some very faintly increased T1 signal in the region of rim enhancement.  There is low-level edema in the right distal adductor magnus and in the long head of the biceps femoris, and to a lesser extent in the distal sartorius. Surrounding subcutaneous edema is present along the margin of the posterior compartment.  On the contralateral side there is abnormal edema signal more proximally in the adductor magnus muscle and tracking along adjacent fascia planes, along with a 4.7 x 3.1 x 2.8 cm lesion with rather thick enhancing walls,  internal nonenhancing fluid signal intensity, and some mild marginal increased T1 signal on precontrast non fat saturated images. Remaining posterior compartmental muscle groups on the left appear intact.  No significant abnormal underlying bony findings, aside from mild red marrow redistribution.  IMPRESSION: 1. Rim enhancing fluid collections in the right distal semimembranosus muscle and further proximally in the contralateral (left) adductor magnus muscle. On the right side, there is also some abnormal increased signal involving other posterior compartmental musculature including the long head of the biceps femoris and sartorius. There is more fascia plane edema on the right than the left in the posterior compartment, and more subcutaneous edema on the right. Given the bilaterality and presence of mildly increased T1 signal along the rim of the fluid collections, and the absence of significant leukocytosis currently, I am suspicious for the possibility of diabetes associated hemorrhagic muscular infarcts. Although I favor this diagnosis over abscess and myositis/fasciitis, ongoing correlation with specific indicators of infection is recommended in determining the need for drainage or fasciotomy.   Electronically Signed   By: WSherryl BartersM.D.   On: 05/15/2014 08:43   [place]  05/15/2014   CLINICAL DATA:  Positive blood cultures and irregular rim enhancing fluid collections in both thighs by MRI. Request has been made to aspirate both collections to confirm infected collections prior to potential surgical incision and drainage.  EXAM: ULTRASOUND GUIDED NEEDLE ASPIRATION OF THIGH SOFT TISSUE ABSCESS COLLECTIONS X 2  MEDICATIONS: 2.0 mg IV Versed; 100 mcg IV Fentanyl  Total Moderate Sedation Time: 30 MIN  PROCEDURE: The procedure, risks, benefits, and alternatives were explained to the patient. Questions regarding the procedure were encouraged and answered. Consent was obtained  from the patient's  daughter due to slight language barrier.  Ultrasound was performed of bilateral posterior thighs to localize fluid collections. These collections were measured. In addition, a permanent ink marker was used to mark the location of bilateral thigh abscess collections on the skin. Both thighs were prepped with Betadine in a sterile fashion, and a sterile drape was applied covering the operative field. A sterile gown and sterile gloves were used for the procedure. Local anesthesia was provided with 1% Lidocaine.  An 18 gauge spinal needle was advanced into the posterior left mid thigh abscess. Aspiration was performed and the needle repositioned under ultrasound. Aspirated fluid sample was sent for aerobic and anaerobic culture studies. Ultrasound was performed after aspiration.  A separate 18 gauge spinal needle was advanced into the posterior right distal thigh abscess. Aspiration was performed in 2 different locations. Aspirated fluid sample was sent for aerobic and anaerobic culture studies. Ultrasound was performed after aspiration.  COMPLICATIONS: None.  FINDINGS: The posterior mid left thigh demonstrates an intramuscular abscess showing thick irregular wall and internal septation. Dimensions are approximately 5.0 x 2.3 x 3.4 cm. Aspiration yielded 10 mL of purulent fluid. Ultrasound shows near complete decompression of the central fluid portion of the abscess after aspiration.  The posterior and distal right thigh demonstrates a multi septated and elongated intramuscular abscess measuring approximately 8.8 x 1.7 x 2.1 cm. This collection was less mature and more multi septated compared to the left thigh collection in yielded only 5 mL of purulent fluid. The collection appears smaller after aspiration but was not completely decompressed.  IMPRESSION: Bilateral posterior thigh abscess aspiration under ultrasound guidance. The left thigh abscess in the posterior mid left thigh yielded 10 mL of purulent fluid. The  right thigh abscess in the posterior distal thigh yielded 5 mL of purulent fluid. Fluid samples were sent separately from both collections for culture analysis. The skin overlying abscess collections was also marked with permanent kink to delineate boundaries for potential surgical incision and drainage.   Electronically Signed   By: Aletta Edouard M.D.   On: 05/15/2014 15:34   US Aspiration  05/15/2014   CLINICAL DATA:  Positive blood cultures and irregular rim enhancing fluid collections in both thighs by MRI. Request has been made to aspirate both collections to confirm infected collections prior to potential surgical incision and drainage.  EXAM: ULTRASOUND GUIDED NEEDLE ASPIRATION OF THIGH SOFT TISSUE ABSCESS COLLECTIONS X 2  MEDICATIONS: 2.0 mg IV Versed; 100 mcg IV Fentanyl  Total Moderate Sedation Time: 30 MIN  PROCEDURE: The procedure, risks, benefits, and alternatives were explained to the patient. Questions regarding the procedure were encouraged and answered. Consent was obtained from the patient's daughter due to slight language barrier.  Ultrasound was performed of bilateral posterior thighs to localize fluid collections. These collections were measured. In addition, a permanent ink marker was used to mark the location of bilateral thigh abscess collections on the skin. Both thighs were prepped with Betadine in a sterile fashion, and a sterile drape was applied covering the operative field. A sterile gown and sterile gloves were used for the procedure. Local anesthesia was provided with 1% Lidocaine.  An 18 gauge spinal needle was advanced into the posterior left mid thigh abscess. Aspiration was performed and the needle repositioned under ultrasound. Aspirated fluid sample was sent for aerobic and anaerobic culture studies. Ultrasound was performed after aspiration.  A separate 18 gauge spinal needle was advanced into the posterior right distal thigh abscess. Aspiration  was performed in 2  different locations. Aspirated fluid sample was sent for aerobic and anaerobic culture studies. Ultrasound was performed after aspiration.  COMPLICATIONS: None.  FINDINGS: The posterior mid left thigh demonstrates an intramuscular abscess showing thick irregular wall and internal septation. Dimensions are approximately 5.0 x 2.3 x 3.4 cm. Aspiration yielded 10 mL of purulent fluid. Ultrasound shows near complete decompression of the central fluid portion of the abscess after aspiration.  The posterior and distal right thigh demonstrates a multi septated and elongated intramuscular abscess measuring approximately 8.8 x 1.7 x 2.1 cm. This collection was less mature and more multi septated compared to the left thigh collection in yielded only 5 mL of purulent fluid. The collection appears smaller after aspiration but was not completely decompressed.  IMPRESSION: Bilateral posterior thigh abscess aspiration under ultrasound guidance. The left thigh abscess in the posterior mid left thigh yielded 10 mL of purulent fluid. The right thigh abscess in the posterior distal thigh yielded 5 mL of purulent fluid. Fluid samples were sent separately from both collections for culture analysis. The skin overlying abscess collections was also marked with permanent kink to delineate boundaries for potential surgical incision and drainage.   Electronically Signed   By: Aletta Edouard M.D.   On: 05/15/2014 15:34   Dg Chest Port 1 View  05/13/2014   CLINICAL DATA:  Hyperglycemia  EXAM: PORTABLE CHEST - 1 VIEW  COMPARISON:  06/30/2008  FINDINGS: Numerous leads and wires project over the chest. Midline trachea. Normal heart size. Transverse aortic atherosclerosis. No pleural effusion or pneumothorax. Diffuse peribronchial thickening. Clear lungs.  IMPRESSION: No acute cardiopulmonary disease.  Peribronchial thickening which may relate to chronic bronchitis or smoking.  Atherosclerosis.   Electronically Signed   By: Abigail Miyamoto M.D.    On: 05/13/2014 19:03   7/18 TTE negative   Assessment/Plan: 62yo M with staph aureus bacteremia found to have bilateral deep tissue infection of thighs, possibly 2nd infected from recent fall.   - recommend to continue with vancomycin and cefazolin for now - would get TEE after he recovers from his I x D procedure tomorrow or possibly can do in the OR? - would treat for a minimum of 4 wk given his current picture, it maybe extended if evidence of endocarditis - would wait to place any picc line until we can document clearance of bacteremia  Jenasis Straley B. [place] [facility] [phone]

## 2014-05-15 NOTE — Progress Notes (Addendum)
[facility] PROGRESS NOTE  Brian Owen VB:[id number] DOB: [birth year] DOA: 05/13/2014 PCP: No PCP Per Patient  Assessment/Plan  S. aureus septicemia with bilateral hamstring abscesses.  Source may have been rash on back and face several weeks ago.  Present at the time of admission.  Fevers and leukocytosis resolving.  Was initially on vanc and zosyn, but changed to vanc + ancef today after culture speciated. -  CXR and UA unremarkable -  Duplex RLE neg for DVT -  MR bilateral thighs:  Rim-enhancing fluid collections in the right distal semimembranous muscle and in the contralateral left abductor magnus muscle with some fascial plane edema.  Differential diagnosis includes possible hemorrhagic muscular infarcts versus abscess with myositis/fasciitis. -  7/16 BCx 2/2 positive for S. Aureus -  7/17 BCx pending -  7/18 BCx pending -  TTE:  No vegetation -  Continue vancomycin + ancef, day 3 total antibiotics -  Check HIV -  ID consult -  IR performed aspiration on 7/18 of both abscesses, cultures sent -  Orthopedics, Dr. Percell Miller to perform debridement/I&D on 7/19 at [facility]  Diabetes mellitus type 2, better controlled -  Hemoglobin A1c 16.0--04/25/2014  -  Continue lantus 10 units -  Continue low dose SSI -  Start teaching for pen insulin -  Nutrition consult for diabetic diet education   Tobacco use with presumptive COPD, stable on room air  Hyponatremia, partly due to volume depletion and improving.  Eating better  Hypokalemia due to poor oral intake, resolved with oral supplementation  Leukocytosis resolving with abx  Normocytic anemia may be partly hemodilutional and due to chronic disease,hgb stable - iron studies and b12 wnl - folate pending - TSH 0.578  Diet:  Diabetic/healthy heart Access:  PIV IVF:  yes Proph:  lovenox  Code Status: full Family Communication: patient and daughter.  Please update daughter Georgiana Shore [phone] daily.  Other daugther Mallie Darting  [phone].   Disposition Plan: Continue MedSurg  Consultants:  IR, Dr. Barkley Boards, Dr. Percell Miller  ID, Dr. Baxter Flattery  Procedures:  CXR  Antibiotics:  Vancomycin 7/16 >>  Zosyn 7/16 >> 7/18  Ancef 7/18 >>  HPI/Subjective:  States he feels much better. Asking when he can go home.  Objective: Filed Vitals:   05/14/14 0740 05/14/14 0800 05/14/14 1516 05/15/14 0551  BP: 148/54 164/72 109/47 133/78  Pulse: 88 93 101 76  Temp:  97.3 F (36.3 C) 98.5 F (36.9 C) 98.2 F (36.8 C)  TempSrc:  Oral Oral Oral  Resp: 21 20 18 16   Height:      Weight:      SpO2: 95% 98% 98% 97%    Intake/Output Summary (Last 24 hours) at 05/15/14 0823 Last data filed at 05/15/14 0552  Gross per 24 hour  Intake    850 ml  Output    500 ml  Net    350 ml   Filed Weights   05/13/14 1840 05/13/14 2149 05/14/14 0300  Weight: 47.628 kg (105 lb) 45.5 kg (100 lb 5 oz) 45.5 kg (100 lb 5 oz)    Exam:   General:  Thin adult male, No acute distress  HEENT:  NCAT, MMM  Cardiovascular:  RRR, nl S1, S2 no mrg, 2+ pulses, warm extremities  Respiratory:  CTAB, no increased WOB  Abdomen:   NABS, soft, NT/ND  MSK:   Normal tone and bulk, no LEE.  Minimal light pink erythema behind the right knee and extending in a palm-sized area superiorly without  induration or warmth.  No fluctuant areas, but firm masslike region of near the medial posterior knee.  Left leg feels unremarkable.  Neuro:  Grossly intact  Data Reviewed: Basic Metabolic Panel:  Recent Labs Lab 05/10/14 0222 05/13/14 1834 05/14/14 0402 05/15/14 0538  NA 130* 123* 131* 132*  K 3.9 4.2 3.3* 3.9  CL 86* 83* 92* 95*  CO2 30 27 29 26   GLUCOSE 434* 614* 86 152*  BUN 11 12 6 7   CREATININE 0.65 0.62 0.61 0.62  CALCIUM 8.9 9.2 8.2* 8.6   Liver Function Tests:  Recent Labs Lab 05/13/14 1834  AST 10  ALT 8  ALKPHOS 132*  BILITOT 0.4  PROT 7.9  ALBUMIN 3.0*   No results found for this basename: LIPASE, AMYLASE,   in the last 168 hours No results found for this basename: AMMONIA,  in the last 168 hours CBC:  Recent Labs Lab 05/10/14 0222 05/13/14 1834 05/14/14 0402 05/15/14 0538  WBC 13.3* 13.3* 16.5* 10.2  NEUTROABS 11.1* 11.3*  --   --   HGB 12.4* 11.1* 10.9* 10.4*  HCT 35.2* 31.7* 31.4* 29.5*  MCV 86.3 86.1 86.0 85.3  PLT 300 287 238 237   Cardiac Enzymes:  Recent Labs Lab 05/14/14 0402  CKTOTAL 70   BNP (last 3 results) No results found for this basename: PROBNP,  in the last 8760 hours CBG:  Recent Labs Lab 05/14/14 0758 05/14/14 1201 05/14/14 1651 05/14/14 2259 05/15/14 0806  GLUCAP 85 118* 221* 182* 146*    Recent Results (from the past 240 hour(s))  CULTURE, BLOOD (ROUTINE X 2)     Status: None   Collection Time    05/13/14  6:24 PM      Result Value Ref Range Status   Specimen Description BLOOD RIGHT HAND   Final   Special Requests BOTTLES DRAWN AEROBIC AND ANAEROBIC 5ML   Final   Culture  Setup Time     Final   Value: 05/13/2014 22:30     Performed at [facility]   Culture     Final   Value: GRAM POSITIVE COCCI IN CLUSTERS     Note: Gram Stain Report Called to,Read Back By and Verified With: CAROLYN HOLLAND 05/14/14 1330 BY [email or web address]     Performed at [facility]   Report Status PENDING   Incomplete  URINE CULTURE     Status: None   Collection Time    05/13/14  6:27 PM      Result Value Ref Range Status   Specimen Description URINE, RANDOM   Final   Special Requests NONE   Final   Culture  Setup Time     Final   Value: 05/13/2014 23:34     Performed at [facility] Count     Final   Value: >=100,000 COLONIES/ML     Performed at [facility]   Culture     Final   Value: Multiple bacterial morphotypes present, none predominant. Suggest appropriate recollection if clinically indicated.     Performed at [facility]   Report Status 05/14/2014 FINAL   Final  CULTURE, BLOOD (ROUTINE X 2)     Status: None    Collection Time    05/13/14  6:44 PM      Result Value Ref Range Status   Specimen Description BLOOD LEFT HAND   Final   Special Requests BOTTLES DRAWN AEROBIC AND ANAEROBIC 5ML   Final   Culture  Setup Time     Final   Value: 05/13/2014 22:31     Performed at [facility]   Culture     Final   Value: GRAM POSITIVE COCCI IN CLUSTERS     Note: Gram Stain Report Called to,Read Back By and Verified With: CAROLYN HOLLAND 05/14/14 1330 BY SMITHERSJ     Performed at [facility]   Report Status PENDING   Incomplete  MRSA PCR SCREENING     Status: None   Collection Time    05/13/14 10:04 PM      Result Value Ref Range Status   MRSA by PCR NEGATIVE  NEGATIVE Final   Comment:            The GeneXpert MRSA Assay (FDA     approved for NASAL specimens     only), is one component of a     comprehensive MRSA colonization     surveillance program. It is not     intended to diagnose MRSA     infection nor to guide or     monitor treatment for     MRSA infections.     Studies: Dg Chest Port 1 View  05/13/2014   CLINICAL DATA:  Hyperglycemia  EXAM: PORTABLE CHEST - 1 VIEW  COMPARISON:  06/30/2008  FINDINGS: Numerous leads and wires project over the chest. Midline trachea. Normal heart size. Transverse aortic atherosclerosis. No pleural effusion or pneumothorax. Diffuse peribronchial thickening. Clear lungs.  IMPRESSION: No acute cardiopulmonary disease.  Peribronchial thickening which may relate to chronic bronchitis or smoking.  Atherosclerosis.   Electronically Signed   By: Abigail Miyamoto M.D.   On: 05/13/2014 19:03    Scheduled Meds: . antiseptic oral rinse  15 mL Mouth Rinse BID  . enoxaparin (LOVENOX) injection  40 mg Subcutaneous QHS  . insulin aspart  0-5 Units Subcutaneous QHS  . insulin aspart  0-9 Units Subcutaneous TID WC  . insulin glargine  10 Units Subcutaneous QHS  . piperacillin-tazobactam (ZOSYN)  IV  3.375 g Intravenous 3 times per day  . pneumococcal 23  valent vaccine  0.5 mL Intramuscular Tomorrow-1000  . saccharomyces boulardii  250 mg Oral BID  . sodium chloride  3 mL Intravenous Q12H  . vancomycin  500 mg Intravenous Q12H   Continuous Infusions: . sodium chloride 0.9 % 1,000 mL with potassium chloride 20 mEq infusion 125 mL/hr at 05/15/14 [id number]    Active Problems:   Sepsis affecting skin   Sepsis   Cellulitis, leg   Hyponatremia   Diabetes mellitus type 2, uncontrolled   Leukocytopenia, unspecified   Normocytic anemia   Hypokalemia    Time spent: 30 min    Reagann Dolce, [place]  [facility] Pager [phone]. If 7PM-7AM, please contact night-coverage at [email or web address], password [facility] 05/15/2014, 8:23 AM  LOS: 2 days

## 2014-05-15 NOTE — Procedures (Addendum)
Procedure:  Ultrasound guided aspiration of bilateral posterior thigh abscesses.    18 G spinal needles utilized.   Left:  5.0 x 2.3 x 3.4 cm thick-walled and septated collection of posterior mid thigh yielded 10 mL of purulent fluid.  Largely collapsed by [place] after aspiration.  Right 8.8 x 1.7 x 2.1 cm multiseptated and less mature collection of posterior distal thigh yielded 5 mL of purulent fluid.  Smaller after aspiration, but not completely decompressed.  Collected fluid sent separately for aerobic and anaerobic cultures/gram stains.  Both regions marked with Sharpie on back of legs under US guidance prior to aspiration to delineate locations.

## 2014-05-16 ENCOUNTER — Encounter (HOSPITAL_COMMUNITY)
Admission: EM | Discharge status: Against Medical Advice | Payer: Self-pay | Source: Home / Self Care | Attending: Internal Medicine

## 2014-05-16 ENCOUNTER — Encounter (HOSPITAL_COMMUNITY): Payer: Medicare Other | Admitting: Anesthesiology

## 2014-05-16 ENCOUNTER — Encounter (HOSPITAL_COMMUNITY): Payer: Self-pay | Admitting: Anesthesiology

## 2014-05-16 ENCOUNTER — Inpatient Hospital Stay (HOSPITAL_COMMUNITY): Payer: Medicare Other | Admitting: Anesthesiology

## 2014-05-16 LAB — CULTURE, BLOOD (ROUTINE X 2)

## 2014-05-16 LAB — GLUCOSE, CAPILLARY: Glucose-Capillary: 133 mg/dL — ABNORMAL HIGH (ref 70–99)

## 2014-05-16 SURGERY — IRRIGATION AND DEBRIDEMENT EXTREMITY
Anesthesia: General | Laterality: Bilateral

## 2014-05-16 MED ORDER — SUCCINYLCHOLINE CHLORIDE 20 MG/ML IJ SOLN
INTRAMUSCULAR | Status: AC
  Filled 2014-05-16: qty 1

## 2014-05-16 MED ORDER — EPHEDRINE SULFATE 50 MG/ML IJ SOLN
INTRAMUSCULAR | Status: AC
  Filled 2014-05-16: qty 1

## 2014-05-16 MED ORDER — PROPOFOL 10 MG/ML IV BOLUS
INTRAVENOUS | Status: AC
  Filled 2014-05-16: qty 20

## 2014-05-16 MED ORDER — NEOSTIGMINE METHYLSULFATE 10 MG/10ML IV SOLN
INTRAVENOUS | Status: AC
  Filled 2014-05-16: qty 1

## 2014-05-16 MED ORDER — MIDAZOLAM HCL 2 MG/2ML IJ SOLN
INTRAMUSCULAR | Status: AC
  Filled 2014-05-16: qty 2

## 2014-05-16 MED ORDER — GLYCOPYRROLATE 0.2 MG/ML IJ SOLN
INTRAMUSCULAR | Status: AC
  Filled 2014-05-16: qty 2

## 2014-05-16 MED ORDER — ONDANSETRON HCL 4 MG/2ML IJ SOLN
INTRAMUSCULAR | Status: AC
  Filled 2014-05-16: qty 2

## 2014-05-16 MED ORDER — PHENYLEPHRINE 40 MCG/ML (10ML) SYRINGE FOR IV PUSH (FOR BLOOD PRESSURE SUPPORT)
PREFILLED_SYRINGE | INTRAVENOUS | Status: AC
  Filled 2014-05-16: qty 10

## 2014-05-16 MED ORDER — FENTANYL CITRATE 0.05 MG/ML IJ SOLN
INTRAMUSCULAR | Status: AC
  Filled 2014-05-16: qty 5

## 2014-05-16 MED ORDER — SULFAMETHOXAZOLE-TMP DS 800-160 MG PO TABS: 1.0000 | ORAL_TABLET | Freq: Two times a day (BID) | ORAL | Status: DC

## 2014-05-16 MED ORDER — ROCURONIUM BROMIDE 50 MG/5ML IV SOLN
INTRAVENOUS | Status: AC
  Filled 2014-05-16: qty 1

## 2014-05-16 MED ORDER — ARTIFICIAL TEARS OP OINT
TOPICAL_OINTMENT | OPHTHALMIC | Status: AC
  Filled 2014-05-16: qty 3.5

## 2014-05-16 SURGICAL SUPPLY — 41 items
BANDAGE ELASTIC 4 VELCRO ST LF (GAUZE/BANDAGES/DRESSINGS) ×3 IMPLANT
BANDAGE ELASTIC 6 VELCRO ST LF (GAUZE/BANDAGES/DRESSINGS) ×3 IMPLANT
BANDAGE GAUZE ELAST BULKY 4 IN (GAUZE/BANDAGES/DRESSINGS) ×3 IMPLANT
BLADE SURG 10 STRL SS (BLADE) ×3 IMPLANT
BNDG COHESIVE 4X5 TAN STRL (GAUZE/BANDAGES/DRESSINGS) ×3 IMPLANT
BOOTCOVER CLEANROOM LRG (PROTECTIVE WEAR) ×6 IMPLANT
COVER SURGICAL LIGHT HANDLE (MISCELLANEOUS) ×3 IMPLANT
CUFF TOURNIQUET SINGLE 34IN LL (TOURNIQUET CUFF) IMPLANT
DURAPREP 26ML APPLICATOR (WOUND CARE) ×3 IMPLANT
ELECT REM PT RETURN 9FT ADLT (ELECTROSURGICAL)
ELECTRODE REM PT RTRN 9FT ADLT (ELECTROSURGICAL) IMPLANT
EVACUATOR 1/8 PVC DRAIN (DRAIN) IMPLANT
FACESHIELD WRAPAROUND (MASK) ×9 IMPLANT
GAUZE XEROFORM 1X8 LF (GAUZE/BANDAGES/DRESSINGS) ×3 IMPLANT
GLOVE BIO SURGEON STRL SZ8 (GLOVE) ×3 IMPLANT
GLOVE ORTHO TXT STRL SZ7.5 (GLOVE) ×6 IMPLANT
GOWN STRL REUS W/ TWL LRG LVL3 (GOWN DISPOSABLE) ×3 IMPLANT
GOWN STRL REUS W/TWL 2XL LVL3 (GOWN DISPOSABLE) ×3 IMPLANT
GOWN STRL REUS W/TWL LRG LVL3 (GOWN DISPOSABLE) ×9
HANDPIECE INTERPULSE COAX TIP (DISPOSABLE)
KIT BASIN OR (CUSTOM PROCEDURE TRAY) ×3 IMPLANT
KIT ROOM TURNOVER OR (KITS) ×3 IMPLANT
MANIFOLD NEPTUNE II (INSTRUMENTS) ×3 IMPLANT
NS IRRIG 1000ML POUR BTL (IV SOLUTION) ×3 IMPLANT
PACK ORTHO EXTREMITY (CUSTOM PROCEDURE TRAY) ×3 IMPLANT
PAD ARMBOARD 7.5X6 YLW CONV (MISCELLANEOUS) ×6 IMPLANT
PENCIL BUTTON HOLSTER BLD 10FT (ELECTRODE) IMPLANT
SET HNDPC FAN SPRY TIP SCT (DISPOSABLE) IMPLANT
SPONGE GAUZE 4X4 12PLY (GAUZE/BANDAGES/DRESSINGS) ×3 IMPLANT
SPONGE LAP 18X18 X RAY DECT (DISPOSABLE) ×3 IMPLANT
STOCKINETTE IMPERVIOUS 9X36 MD (GAUZE/BANDAGES/DRESSINGS) ×3 IMPLANT
SUT ETHILON 3 0 PS 1 (SUTURE) IMPLANT
TOWEL OR 17X24 6PK STRL BLUE (TOWEL DISPOSABLE) ×3 IMPLANT
TOWEL OR 17X26 10 PK STRL BLUE (TOWEL DISPOSABLE) ×3 IMPLANT
TOWEL OR NON WOVEN STRL DISP B (DISPOSABLE) ×3 IMPLANT
TUBE ANAEROBIC SPECIMEN COL (MISCELLANEOUS) IMPLANT
TUBE CONNECTING 12'X1/4 (SUCTIONS) ×1
TUBE CONNECTING 12X1/4 (SUCTIONS) ×2 IMPLANT
UNDERPAD 30X30 INCONTINENT (UNDERPADS AND DIAPERS) ×3 IMPLANT
WATER STERILE IRR 1000ML POUR (IV SOLUTION) ×3 IMPLANT
YANKAUER SUCT BULB TIP NO VENT (SUCTIONS) ×3 IMPLANT

## 2014-05-16 NOTE — Progress Notes (Signed)
Patient expressing desire to leave the Hospital immediately; patient and daughter understand that this decision is not medically advisable at this time. Interpreter used to ensure clarity of decision to disregard medical advice and to explain medical AMA form. Patient signs form with MD present. Patient understands and accepts the risks involved and assumes full responsibilty of this decision. IV's removed with no complication. Patient given prescription and asked to collect all personal belongings. Patient discharged via wheelchair with personal belongings and prescription.

## 2014-05-16 NOTE — Discharge Instructions (Signed)
Patient at this time expresses desire to leave the Hospital immidiately, patient and daughter via interpreter have been warned that this is not Medically advisable at this time, and can result in Medical complications like Death and Disability, patient understands and accepts the risks involved and assumes full responsibilty of this decision.

## 2014-05-16 NOTE — Discharge Summary (Signed)
PATIENT DECIDED TO LEAVE AMA   Patient at this time expresses desire to leave the Hospital immidiately, patient and daughter via interpreter have been warned that this is not Medically advisable at this time, and can result in Medical complications like Death and Disability, patient understands and accepts the risks involved and assumes full responsibilty of this decision.   Me and Dr Bosie Clos counseled him and his daughter extensively via a interpreter but he signed out AMA, says will come back in 1-2 days for surgery, PICC, TEE.      See H&P and last Progress Note   Last Progress note plan  S. aureus septicemia with bilateral hamstring abscesses. Source may have been rash on back and face several weeks ago. Present at the time of admission. Fevers and leukocytosis resolving. Was initially on vanc and zosyn, but changed to vanc + ancef today after culture speciated.  - CXR and UA unremarkable  - Duplex RLE neg for DVT  - MR bilateral thighs: Rim-enhancing fluid collections in the right distal semimembranous muscle and in the contralateral left abductor magnus muscle with some fascial plane edema. Differential diagnosis includes possible hemorrhagic muscular infarcts versus abscess with myositis/fasciitis.  - 7/16 BCx 2/2 positive for S. Aureus  - 7/17 BCx pending  - 7/18 BCx pending  - TTE: No vegetation  - Continue vancomycin + ancef, day 3 total antibiotics  - Check HIV  - ID consult  - IR performed aspiration on 7/18 of both abscesses, cultures sent  - Orthopedics, Dr. Percell Miller to perform debridement/I&D on 7/19 at [facility]

## 2014-05-16 NOTE — Progress Notes (Signed)
Patient and patients family refusing surgery. They state that they would like an alternative route to surgery. Dr. Percell Miller notified and will speak with the family in person on tomorrow.

## 2014-05-17 ENCOUNTER — Encounter (HOSPITAL_COMMUNITY): Payer: Self-pay | Admitting: Emergency Medicine

## 2014-05-17 ENCOUNTER — Emergency Department (HOSPITAL_COMMUNITY)
Admission: EM | Admit: 2014-05-17 | Discharge: 2014-05-17 | Discharge status: Against Medical Advice | Payer: Medicare Other | Attending: Emergency Medicine | Admitting: Emergency Medicine

## 2014-05-17 DIAGNOSIS — Z87891 Personal history of nicotine dependence: Secondary | ICD-10-CM | POA: Diagnosis not present

## 2014-05-17 DIAGNOSIS — E119 Type 2 diabetes mellitus without complications: Secondary | ICD-10-CM | POA: Insufficient documentation

## 2014-05-17 DIAGNOSIS — Z4802 Encounter for removal of sutures: Secondary | ICD-10-CM | POA: Insufficient documentation

## 2014-05-17 DIAGNOSIS — Z792 Long term (current) use of antibiotics: Secondary | ICD-10-CM | POA: Diagnosis not present

## 2014-05-17 DIAGNOSIS — Z79899 Other long term (current) drug therapy: Secondary | ICD-10-CM | POA: Diagnosis not present

## 2014-05-17 DIAGNOSIS — A412 Sepsis due to unspecified staphylococcus: Secondary | ICD-10-CM | POA: Diagnosis not present

## 2014-05-17 LAB — TRANSFERRIN: Transferrin: 128 mg/dL — ABNORMAL LOW (ref 200–360)

## 2014-05-17 NOTE — Progress Notes (Signed)
Spoke with ED NP provider regarding mr. Brian Owen. He is here to have sutures removed, however they were unaware that he had staph aureus bacteremia and recently left AMA over the weekend. I asked her to have patient admitted in order to do further work up and management of SAB to include repeat blood cx and starting cefazolin 2gm IV Q 8hr.  Elzie Rings [place] for Infectious Diseases [phone]

## 2014-05-17 NOTE — Discharge Instructions (Signed)
Nhi?m trng huy?t (Bacteremia) Nhi?m trng huy?t x?y ra khi vi khu?n xm nh?p vo mu. Mu bnh th??ng th??ng khng c vi khu?n. Nhi?m trng huy?t l b?nh nhi?m trng m?t chi?u c th? ly lan t? b? ph?n ny sang b? ph?n khc c?a c? th?. NGUYN NHN  B?t c? nguyn nhn g cho php vi khu?n xm nh?p vo c? th?. V d?:  ?ng thng.  ?ng truy?n ???ng t?nh m?ch (IV).  Cc v?t c?t ho?c v?t x??c trn da.  Nhi?m trng huy?t t?m th?i c th? x?y ra trong th? thu?t nha khoa, trong khi ?nh r?ng ho?c ??i ti?n. V?n ?? ny hi?m khi gy ra b?t k? tri?u ch?ng ho?c v?n ?? v? n?i khoa no.  Vi khu?n c?ng c th? xm nh?p vo dng mu do bi?n ch?ng c?a nhi?m khu?n ? nh?ng n?i khc. V?n ?? ny bao g?m cc v?t th??ng b? nhi?m trng v cc nhi?m khu?n ?:  Ph?i (vim ph?i).  Th?n (vim th?n-b? th?n).  Ru?t (vim ru?t, vim ??i trng).  Cc c? quan trong ? b?ng (vim ru?t th?a, vim ti m?t, vim ti th?a). TRI?U CH?NG C? th? th??ng c th? di?t ???c m?t l??ng nh? vi khu?n trong mu m?t cch nhanh chng. Nhi?m trng huy?t trong th?i gian ng?n th??ng khng gy ra v?n ??.  V?n ?? c th? x?y ra n?u vi khu?n b?t ??u pht tri?n v? s? l??ng ho?c ly sang cc b? ph?n khc c?a c? th?. N?u vi khu?n b?t ??u pht tri?n, b?n c th? b?:  ?n l?nh.  S?t.  Bu?n nn.  Nn.  ?? m? hi.  Chng m?t v huy?t p th?p.  ?au.  N?u vi khu?n b?t ??u pht tri?n ? mng bao quanh no, g?i l vim mng no. ?i?u ny c th? gy ?au ??u nghim tr?ng, nhi?u v?n ?? khc v th?m ch t? vong.  N?u vi khu?n b?t ??u pht tri?n trong m?t kh?p, n s? gy ra vim kh?p km theo ?au cc kh?p. N?u vi khu?n b?t ??u pht tri?n ? x??ng, ???c g?i l vim x??ng t?y.  Vi khu?n t? mu c?ng c th? gy ra l? lot (p-xe) ? nhi?u c? quan, ch?ng h?n nh? c? b?p, gan, lch, ph?i, no v th?n. CH?N ?ON  Tnh tr?ng ny ???c ch?n ?on b?ng cch c?y mu.  Cc l?n nui c?y c?ng c th? ???c th?c hi?n ??i v?i cc b? ph?n khc c?a c? th? ???c cho l  nguyn nhn gy ra nhi?m trng huy?t. M?t ph?n nh? c?a m, d?ch ho?c ch?t ti?t khc c?a c? th? s? ???c l?y m?u. M?u ny sau ? ???c ??t trn m?t ??a nui c?y ?? xem xem c b?t k? vi khu?n no pht tri?n khng.  Cc xt nghi?m khc trong phng th nghi?m c th? ???c th?c hi?n v cc k?t qu? c th? l b?t th??ng. ?I?U TR? ?i?u tr? ll c?u ph?i n?m vi?n. B?n s? ???c cho dng thu?c khng sinh thng qua ?ng truy?n theo ???ng t?nh m?ch. [place] NG?A Nh?ng ng??i c t?ng nguy c? b? nhi?m trng huy?t ho?c bi?n ch?ng c th? ???c cho dng thu?c khng sinh tr??c khi lm m?t s? th? thu?t nh?t ??nh. V d?:  Ng??i c ti?ng th?i tim ho?c van tim nhn t?o, tr??c khi lm s?ch r?ng.  Tr??c khi ph?u thu?t ho?c th?c hi?n m?t th? thu?n xm l?n khc.  Tr??c khi th?c hi?n th? thu?t ? ru?t. Document Released: 04/04/2010 Document Revised:  06/17/2013 Exit[employer] Patient Information 2015 [place], [place]. This information is not intended to replace advice given to you by your health care provider. Make sure you discuss any questions you have with your health care provider.

## 2014-05-17 NOTE — ED Notes (Signed)
Pt here for staple removal from scalp. Wound clean, dry, intact.

## 2014-05-17 NOTE — ED Provider Notes (Signed)
Medical screening examination/treatment/procedure(s) were performed by non-physician practitioner and as supervising physician I was immediately available for consultation/collaboration.   Neta Ehlers, MD 05/17/14 [phone]

## 2014-05-17 NOTE — ED Provider Notes (Signed)
CSN: [id number]     Arrival date & time 05/17/14  1014 History   First MD Initiated Contact with Patient 05/17/14 1017     This chart was scribed for non-physician practitioner, Charlann Lange PA-C, working with Neta Ehlers, MD by Forrestine Him, ED Scribe. This patient was seen in room TR09C/TR09C and the patient's care was started at 11:17 AM.   Chief Complaint  Patient presents with  . Suture / Staple Removal   Patient is a 63 y.o. male presenting with suture removal. The history is provided by the patient. No language interpreter was used.  Suture / Staple Removal This is a new problem. The problem has been rapidly improving. Nothing aggravates the symptoms. Nothing relieves the symptoms. He has tried nothing for the symptoms.  Suture / Staple Removal This is a new problem. The problem has been rapidly improving. Pertinent negatives include no chills, fever or rash. Nothing aggravates the symptoms. He has tried nothing for the symptoms.    HPI Comments: Rolfe Bensinger is a 63 y.o. male who presents to the Emergency Department for staple removal today. Pt sustained a fall about 7 days ago while in the bathroom and was evaluated in the ED on 7/13. Per previous note, pt was "found in a pool of blood" with a wound to the L scalp. Daughter denies any issues or abnormalities since onset of staple placement. No fever or chills at this time.  During 7/13 pt was advised and encouraged to check in to hospital for admission for sepsis blood infection. With daughter as interpreter at bedside, discussed the nature of pts illness along with possible dangers of going without medical attention. Daughter states while interpreting for the patient that "he is going to think about it and will get back to you". States at this time he does not want to be admitted to the hospital. He was made aware that illness could possibly be life threatening. He agreed and understood circumstances.   Past Medical History   Diagnosis Date  . Diabetes mellitus without complication    History reviewed. No pertinent past surgical history. History reviewed. No pertinent family history. History  Substance Use Topics  . Smoking status: Former Smoker    Types: Cigarettes  . Smokeless tobacco: Former Systems developer    Quit date: 03/29/2014  . Alcohol Use: No    Review of Systems  Constitutional: Negative for fever and chills.  Skin: Positive for wound. Negative for rash.  Psychiatric/Behavioral: Negative for confusion.      Allergies  Review of patient's allergies indicates no known allergies.  Home Medications   Prior to Admission medications   Medication Sig Start Date End Date Taking? Authorizing Provider  loperamide (IMODIUM) 2 MG capsule Take 1 capsule (2 mg total) by mouth 4 (four) times daily as needed for diarrhea or loose stools. 05/10/14   Kalman Drape, MD  metFORMIN (GLUCOPHAGE) 850 MG tablet Take 850 mg by mouth 2 (two) times daily with a meal. 04/25/14   Virgel Manifold, MD  sulfamethoxazole-trimethoprim (BACTRIM DS) 800-160 MG per tablet Take 1 tablet by mouth 2 (two) times daily. 05/16/14   Thurnell Lose, MD   Triage Vitals: BP 136/61  Pulse 97  Temp(Src) 97.9 F (36.6 C) (Oral)  Resp 16  Wt 100 lb (45.36 kg)  SpO2 100%   Physical Exam  Nursing note and vitals reviewed. Constitutional: He is oriented to person, place, and time. He appears well-developed and well-nourished.  HENT:  Head: Normocephalic.  Eyes: EOM are normal.  Neck: Normal range of motion.  Pulmonary/Chest: Effort normal.  Abdominal: He exhibits no distension.  Musculoskeletal: Normal range of motion.  Neurological: He is alert and oriented to person, place, and time.  Skin:  Linear laceration that is healed with intact staples.   Psychiatric: He has a normal mood and affect.    ED Course  Procedures (including critical care time)  DIAGNOSTIC STUDIES: Oxygen Saturation is 100% on RA, Normal by my interpretation.     COORDINATION OF CARE: 11:26 AM- Will remove staples. Discussed treatment plan with pt at bedside and pt agreed to plan.     Labs Review Labs Reviewed - No data to display  Imaging Review US Aspiration  05/15/2014   CLINICAL DATA:  Positive blood cultures and irregular rim enhancing fluid collections in both thighs by MRI. Request has been made to aspirate both collections to confirm infected collections prior to potential surgical incision and drainage.  EXAM: ULTRASOUND GUIDED NEEDLE ASPIRATION OF THIGH SOFT TISSUE ABSCESS COLLECTIONS X 2  MEDICATIONS: 2.0 mg IV Versed; 100 mcg IV Fentanyl  Total Moderate Sedation Time: 30 MIN  PROCEDURE: The procedure, risks, benefits, and alternatives were explained to the patient. Questions regarding the procedure were encouraged and answered. Consent was obtained from the patient's daughter due to slight language barrier.  Ultrasound was performed of bilateral posterior thighs to localize fluid collections. These collections were measured. In addition, a permanent ink marker was used to mark the location of bilateral thigh abscess collections on the skin. Both thighs were prepped with Betadine in a sterile fashion, and a sterile drape was applied covering the operative field. A sterile gown and sterile gloves were used for the procedure. Local anesthesia was provided with 1% Lidocaine.  An 18 gauge spinal needle was advanced into the posterior left mid thigh abscess. Aspiration was performed and the needle repositioned under ultrasound. Aspirated fluid sample was sent for aerobic and anaerobic culture studies. Ultrasound was performed after aspiration.  A separate 18 gauge spinal needle was advanced into the posterior right distal thigh abscess. Aspiration was performed in 2 different locations. Aspirated fluid sample was sent for aerobic and anaerobic culture studies. Ultrasound was performed after aspiration.  COMPLICATIONS: None.  FINDINGS: The posterior mid left  thigh demonstrates an intramuscular abscess showing thick irregular wall and internal septation. Dimensions are approximately 5.0 x 2.3 x 3.4 cm. Aspiration yielded 10 mL of purulent fluid. Ultrasound shows near complete decompression of the central fluid portion of the abscess after aspiration.  The posterior and distal right thigh demonstrates a multi septated and elongated intramuscular abscess measuring approximately 8.8 x 1.7 x 2.1 cm. This collection was less mature and more multi septated compared to the left thigh collection in yielded only 5 mL of purulent fluid. The collection appears smaller after aspiration but was not completely decompressed.  IMPRESSION: Bilateral posterior thigh abscess aspiration under ultrasound guidance. The left thigh abscess in the posterior mid left thigh yielded 10 mL of purulent fluid. The right thigh abscess in the posterior distal thigh yielded 5 mL of purulent fluid. Fluid samples were sent separately from both collections for culture analysis. The skin overlying abscess collections was also marked with permanent kink to delineate boundaries for potential surgical incision and drainage.   Electronically Signed   By: Aletta Edouard M.D.   On: 05/15/2014 15:34   US Aspiration  05/15/2014   CLINICAL DATA:  Positive blood cultures and irregular rim enhancing fluid collections  in both thighs by MRI. Request has been made to aspirate both collections to confirm infected collections prior to potential surgical incision and drainage.  EXAM: ULTRASOUND GUIDED NEEDLE ASPIRATION OF THIGH SOFT TISSUE ABSCESS COLLECTIONS X 2  MEDICATIONS: 2.0 mg IV Versed; 100 mcg IV Fentanyl  Total Moderate Sedation Time: 30 MIN  PROCEDURE: The procedure, risks, benefits, and alternatives were explained to the patient. Questions regarding the procedure were encouraged and answered. Consent was obtained from the patient's daughter due to slight language barrier.  Ultrasound was performed of  bilateral posterior thighs to localize fluid collections. These collections were measured. In addition, a permanent ink marker was used to mark the location of bilateral thigh abscess collections on the skin. Both thighs were prepped with Betadine in a sterile fashion, and a sterile drape was applied covering the operative field. A sterile gown and sterile gloves were used for the procedure. Local anesthesia was provided with 1% Lidocaine.  An 18 gauge spinal needle was advanced into the posterior left mid thigh abscess. Aspiration was performed and the needle repositioned under ultrasound. Aspirated fluid sample was sent for aerobic and anaerobic culture studies. Ultrasound was performed after aspiration.  A separate 18 gauge spinal needle was advanced into the posterior right distal thigh abscess. Aspiration was performed in 2 different locations. Aspirated fluid sample was sent for aerobic and anaerobic culture studies. Ultrasound was performed after aspiration.  COMPLICATIONS: None.  FINDINGS: The posterior mid left thigh demonstrates an intramuscular abscess showing thick irregular wall and internal septation. Dimensions are approximately 5.0 x 2.3 x 3.4 cm. Aspiration yielded 10 mL of purulent fluid. Ultrasound shows near complete decompression of the central fluid portion of the abscess after aspiration.  The posterior and distal right thigh demonstrates a multi septated and elongated intramuscular abscess measuring approximately 8.8 x 1.7 x 2.1 cm. This collection was less mature and more multi septated compared to the left thigh collection in yielded only 5 mL of purulent fluid. The collection appears smaller after aspiration but was not completely decompressed.  IMPRESSION: Bilateral posterior thigh abscess aspiration under ultrasound guidance. The left thigh abscess in the posterior mid left thigh yielded 10 mL of purulent fluid. The right thigh abscess in the posterior distal thigh yielded 5 mL of  purulent fluid. Fluid samples were sent separately from both collections for culture analysis. The skin overlying abscess collections was also marked with permanent kink to delineate boundaries for potential surgical incision and drainage.   Electronically Signed   By: Aletta Edouard M.D.   On: 05/15/2014 15:34     EKG Interpretation None      MDM   Final diagnoses:  None    1. Staple removal 2. Staph septicemia  The patient is adamant that he does not want to be admitted to the hospital for treatment of blood infection. He clearly understands that the infection can be life threatening but reports he will think about further treatment and "get back to [place]". He intends to follow up with Dr. Percell Miller for wound care as well. He was asked multiple times to allow treatment but he continues to refuse.   Staples removed. AMA discharge.  I personally performed the services described in this documentation, which was scribed in my presence. The recorded information has been reviewed and is accurate.    Dewaine Oats, PA-C 05/17/14 1149

## 2014-05-18 LAB — CULTURE, ROUTINE-ABSCESS
Gram Stain: NONE SEEN
SPECIAL REQUESTS: NORMAL
SPECIAL REQUESTS: NORMAL

## 2014-05-20 LAB — ANAEROBIC CULTURE
GRAM STAIN: NONE SEEN
Special Requests: NORMAL
Special Requests: NORMAL

## 2014-05-20 LAB — CULTURE, BLOOD (SINGLE): Culture: NO GROWTH

## 2014-05-21 LAB — CULTURE, BLOOD (SINGLE): CULTURE: NO GROWTH

## 2014-06-23 ENCOUNTER — Ambulatory Visit (INDEPENDENT_AMBULATORY_CARE_PROVIDER_SITE_OTHER): Payer: Medicare Other | Admitting: Family Medicine

## 2014-06-23 VITALS — BP 150/80 | HR 98 | Temp 98.4°F | Resp 14 | Ht 65.0 in | Wt 99.0 lb

## 2014-06-23 DIAGNOSIS — Z8679 Personal history of other diseases of the circulatory system: Secondary | ICD-10-CM

## 2014-06-23 DIAGNOSIS — IMO0001 Reserved for inherently not codable concepts without codable children: Secondary | ICD-10-CM | POA: Diagnosis not present

## 2014-06-23 DIAGNOSIS — Z789 Other specified health status: Secondary | ICD-10-CM | POA: Diagnosis not present

## 2014-06-23 DIAGNOSIS — K08109 Complete loss of teeth, unspecified cause, unspecified class: Secondary | ICD-10-CM

## 2014-06-23 DIAGNOSIS — E1165 Type 2 diabetes mellitus with hyperglycemia: Secondary | ICD-10-CM

## 2014-06-23 DIAGNOSIS — Z87891 Personal history of nicotine dependence: Secondary | ICD-10-CM

## 2014-06-23 LAB — HEMOGLOBIN A1C
Hgb A1c MFr Bld: 13.7 % — ABNORMAL HIGH (ref <5.7)
Mean Plasma Glucose: 346 mg/dL — ABNORMAL HIGH (ref <117)

## 2014-06-23 LAB — CBC WITH DIFFERENTIAL/PLATELET
BASOS PCT: 0 % (ref 0–1)
Basophils Absolute: 0 10*3/uL (ref 0.0–0.1)
Eosinophils Absolute: 0.2 10*3/uL (ref 0.0–0.7)
Eosinophils Relative: 2 % (ref 0–5)
HCT: 37.5 % — ABNORMAL LOW (ref 39.0–52.0)
HEMOGLOBIN: 12.7 g/dL — AB (ref 13.0–17.0)
LYMPHS PCT: 30 % (ref 12–46)
Lymphs Abs: 2.4 10*3/uL (ref 0.7–4.0)
MCH: 29.8 pg (ref 26.0–34.0)
MCHC: 33.9 g/dL (ref 30.0–36.0)
MCV: 88 fL (ref 78.0–100.0)
MONO ABS: 0.5 10*3/uL (ref 0.1–1.0)
MONOS PCT: 6 % (ref 3–12)
NEUTROS ABS: 5 10*3/uL (ref 1.7–7.7)
NEUTROS PCT: 62 % (ref 43–77)
Platelets: 295 10*3/uL (ref 150–400)
RBC: 4.26 MIL/uL (ref 4.22–5.81)
RDW: 14.1 % (ref 11.5–15.5)
WBC: 8.1 10*3/uL (ref 4.0–10.5)

## 2014-06-23 NOTE — Progress Notes (Signed)
Subjective:    Patient ID: Brian Owen, male    DOB: [birth year], 63 y.o.   MRN: [id number]  HPI Patient presents to establish care. He speaks [place], so we are utilizing a representative from the language line to help with communication. He states that he has been utilizing the emergency room for primary healthcare needs. The patient is having a difficult time following the representative. He is continuously repeating that he only needs has medications refilled and that he feels good. Notified patient's daughter and granddaughter to aid with communication. The patient's daughter states that he was hospitalized for sepsis 1 month ago. He left the hospital against medical advise prior to treatment. He denies weakness, fever, shortness of breath, chest pains, myalgias, nausea, vomiting, or diarrhea.    He has a history of uncontrolled diabetes. He was  started on Metformin after a recent emergency room visit. Last hemoglobinA1C was 16.0. Patient had been taking Metformin consistently until medication ran out, per daughter. He refused to start insulin while in the hospital due to fear of needles. He states that he is not exercising and his diet consists of mostly fruits, vegetables, and rice. The patient's daughter states that he is not checking blood sugars at home.   Marland Kitchen  He states that he quit smoking 2 months ago after a 30 year history of smoking. He states that he was smoking a pack per day.  state that he is here to est [place] Patient states that he was going [employer].     Review of Systems  Constitutional: Negative.  Negative for fever, fatigue and unexpected weight change.  HENT: Negative.   Eyes: Negative.   Respiratory: Negative.  Negative for shortness of breath.   Cardiovascular: Negative.  Negative for chest pain, palpitations and leg swelling.  Gastrointestinal: Negative.   Endocrine: Negative.  Negative for polydipsia, polyphagia and polyuria.  Genitourinary: Negative.     Musculoskeletal: Positive for back pain.  Skin: Negative.   Allergic/Immunologic: Negative.   Neurological: Negative.  Negative for dizziness, light-headedness and numbness.  Hematological: Negative.   Psychiatric/Behavioral: Negative.        Objective:   Physical Exam  Constitutional: He appears well-developed. He is uncooperative. He has a sickly appearance. No distress.  HENT:  Head: Normocephalic.  Right Ear: External ear normal.  Mouth/Throat: Oropharynx is clear and moist and mucous membranes are normal. He does not have dentures. No oral lesions. No dental abscesses.  Eyes: Pupils are equal, round, and reactive to light. Lids are everted and swept, no foreign bodies found.  Neck: Normal range of motion. Neck supple.  Cardiovascular: Normal rate, regular rhythm, normal heart sounds and intact distal pulses.   Pulmonary/Chest: Effort normal and breath sounds normal.  Abdominal: Soft. Bowel sounds are normal.  Musculoskeletal: Normal range of motion.  Lymphadenopathy:       Head (right side): No submental and no submandibular adenopathy present.       Head (left side): No submental and no submandibular adenopathy present.  Neurological: He is alert.  Skin: Rash noted. Rash is maculopapular.     Psychiatric: He has a normal mood and affect. His speech is normal and behavior is normal. Thought content normal.         BP 150/80  Pulse 98  Temp(Src) 98.4 F (36.9 C) (Oral)  Resp 14  Ht 5\' 5"  (1.651 m)  Wt 99 lb (44.906 kg)  BMI 16.47 kg/m2 Assessment & Plan:   1. Diabetes mellitus type  2, uncontrolled Mr. Inmer, his daughter, and I discussed the importance of obtaining labs. He was very reluctant about the blood draw. He states that he only wants the medications and he does not want to be stuck by a needle. Informed patient that laboratory values are important to determine whether the medication is actually effective. His daughter expressed understanding. He refuses to  check blood sugars at home or start insulin per daughter.   - Lipid Panel - CBC with Differential - COMPLETE METABOLIC PANEL WITH GFR - Hemoglobin A1c  2. History of hypertension The patient has a history of high blood pressure according to his daughter. He has never been on blood pressure medications. Manual re-check is 138/82.  - Lipid Panel - COMPLETE METABOLIC PANEL WITH GFR  3. Language barrier to communication Utilized the language line to assist with communication.   4. Language barrier affecting health care Mr. Tesla speaks very little english. The patient is not receptive/adherent to current interventions.  5. Refused pneumococcal vaccination He refused vaccinations due to fear of needles. He states that he does not want to be stuck  6. Refused diphtheria-pertussis-tetanus (DPT) vaccination   7. Edentulism, complete, unspecified edentulism He does not have dentures. He states that he eats fine without teeth.   8. History of smoking:   History of smoking-quit smoking in June   RTC: 1 month with Dr. Zigmund Daniel for re-check of uncontrolled diabetes Dorena Dew, FNP

## 2014-06-24 ENCOUNTER — Telehealth: Payer: Self-pay | Admitting: Family Medicine

## 2014-06-24 DIAGNOSIS — E1165 Type 2 diabetes mellitus with hyperglycemia: Secondary | ICD-10-CM

## 2014-06-24 DIAGNOSIS — IMO0002 Reserved for concepts with insufficient information to code with codable children: Secondary | ICD-10-CM

## 2014-06-24 LAB — COMPLETE METABOLIC PANEL WITH GFR
ALBUMIN: 4.3 g/dL (ref 3.5–5.2)
ALK PHOS: 154 U/L — AB (ref 39–117)
ALT: 9 U/L (ref 0–53)
AST: 12 U/L (ref 0–37)
BUN: 12 mg/dL (ref 6–23)
CALCIUM: 9.3 mg/dL (ref 8.4–10.5)
CHLORIDE: 90 meq/L — AB (ref 96–112)
CO2: 29 mEq/L (ref 19–32)
Creat: 0.85 mg/dL (ref 0.50–1.35)
GFR, Est African American: >89 mL/min
GLUCOSE: 440 mg/dL — AB (ref 70–99)
POTASSIUM: 4.1 meq/L (ref 3.5–5.3)
SODIUM: 133 meq/L — AB (ref 135–145)
TOTAL PROTEIN: 8.4 g/dL — AB (ref 6.0–8.3)
Total Bilirubin: 0.4 mg/dL (ref 0.2–1.2)

## 2014-06-24 LAB — LIPID PANEL
Cholesterol: 176 mg/dL (ref 0–200)
HDL: 70 mg/dL (ref >39)
LDL CALC: 85 mg/dL (ref 0–99)
Total CHOL/HDL Ratio: 2.5 Ratio
Triglycerides: 105 mg/dL (ref <150)
VLDL: 21 mg/dL (ref 0–40)

## 2014-06-24 MED ORDER — METFORMIN HCL 1000 MG PO TABS: 1000.0000 mg | ORAL_TABLET | Freq: Two times a day (BID) | ORAL | Status: DC

## 2014-06-24 NOTE — Telephone Encounter (Signed)
Meds ordered this encounter  Medications  . metFORMIN (GLUCOPHAGE) 1000 MG tablet    Sig: Take 1 tablet (1,000 mg total) by mouth 2 (two) times daily with a meal.    Dispense:  60 tablet    Refill:  2    Order Specific Question:  Supervising Provider    Answer:  MATTHEWS, MICHELLE A [[id number]]  Increased Metformin from 850 mg twice daily. Brian Owen Hemoglobin a1C decreased from 16.0 to 13.3. Patient to continue current diet regimen. Patient will need to return to clinic in 1 month with interpreter. Patient will require language specific diabetic education. Utilized language line to educate patient pertaining to diet and medication regimen. Patient refuses to start insulin at this point, discussed at length. Will follow up as needed.

## 2014-06-25 ENCOUNTER — Encounter: Payer: Self-pay | Admitting: Family Medicine

## 2014-06-27 DIAGNOSIS — K08109 Complete loss of teeth, unspecified cause, unspecified class: Secondary | ICD-10-CM | POA: Insufficient documentation

## 2014-06-27 DIAGNOSIS — Z2821 Immunization not carried out because of patient refusal: Secondary | ICD-10-CM | POA: Insufficient documentation

## 2014-06-27 DIAGNOSIS — Z87891 Personal history of nicotine dependence: Secondary | ICD-10-CM | POA: Insufficient documentation

## 2014-06-27 DIAGNOSIS — I10 Essential (primary) hypertension: Secondary | ICD-10-CM | POA: Insufficient documentation

## 2014-06-27 DIAGNOSIS — Z789 Other specified health status: Secondary | ICD-10-CM | POA: Insufficient documentation

## 2014-06-27 NOTE — Patient Instructions (Signed)
Diabetes Mellitus and Food It is important for you to manage your blood sugar (glucose) level. Your blood glucose level can be greatly affected by what you eat. Eating healthier foods in the appropriate amounts throughout the day at about the same time each day will help you control your blood glucose level. It can also help slow or prevent worsening of your diabetes mellitus. Healthy eating may even help you improve the level of your blood pressure and reach or maintain a healthy weight.  HOW CAN FOOD AFFECT ME? Carbohydrates Carbohydrates affect your blood glucose level more than any other type of food. Your dietitian will help you determine how many carbohydrates to eat at each meal and teach you how to count carbohydrates. Counting carbohydrates is important to keep your blood glucose at a healthy level, especially if you are using insulin or taking certain medicines for diabetes mellitus. Alcohol Alcohol can cause sudden decreases in blood glucose (hypoglycemia), especially if you use insulin or take certain medicines for diabetes mellitus. Hypoglycemia can be a life-threatening condition. Symptoms of hypoglycemia (sleepiness, dizziness, and disorientation) are similar to symptoms of having too much alcohol.  If your health care provider has given you approval to drink alcohol, do so in moderation and use the following guidelines:  Women should not have more than one drink per day, and men should not have more than two drinks per day. One drink is equal to:  12 oz of beer.  5 oz of wine.  1 oz of hard liquor.  Do not drink on an empty stomach.  Keep yourself hydrated. Have water, diet soda, or unsweetened iced tea.  Regular soda, juice, and other mixers might contain a lot of carbohydrates and should be counted. WHAT FOODS ARE NOT RECOMMENDED? As you make food choices, it is important to remember that all foods are not the same. Some foods have fewer nutrients per serving than other  foods, even though they might have the same number of calories or carbohydrates. It is difficult to get your body what it needs when you eat foods with fewer nutrients. Examples of foods that you should avoid that are high in calories and carbohydrates but low in nutrients include:  Trans fats (most processed foods list trans fats on the Nutrition Facts label).  Regular soda.  Juice.  Candy.  Sweets, such as cake, pie, doughnuts, and cookies.  Fried foods. WHAT FOODS CAN I EAT? Have nutrient-rich foods, which will nourish your body and keep you healthy. The food you should eat also will depend on several factors, including:  The calories you need.  The medicines you take.  Your weight.  Your blood glucose level.  Your blood pressure level.  Your cholesterol level. You also should eat a variety of foods, including:  Protein, such as meat, poultry, fish, tofu, nuts, and seeds (lean animal proteins are best).  Fruits.  Vegetables.  Dairy products, such as milk, cheese, and yogurt (low fat is best).  Breads, grains, pasta, cereal, rice, and beans.  Fats such as olive oil, trans fat-free margarine, canola oil, avocado, and olives. DOES EVERYONE WITH DIABETES MELLITUS HAVE THE SAME MEAL PLAN? Because every person with diabetes mellitus is different, there is not one meal plan that works for everyone. It is very important that you meet with a dietitian who will help you create a meal plan that is just right for you. Document Released: 07/12/2005 Document Revised: 10/20/2013 Document Reviewed: 09/11/2013 [employer] Patient Information 2015 [employer]. This   information is not intended to replace advice given to you by your health care provider. Make sure you discuss any questions you have with your health care provider. How to Avoid Diabetes Problems You can do a lot to prevent or slow down diabetes problems. Following your diabetes plan and taking care of yourself can reduce  your risk of serious or life-threatening complications. Below, you will find certain things you can do to prevent diabetes problems. MANAGE YOUR DIABETES Follow your health care provider's, nurse educator's, and dietitian's instructions for managing your diabetes. They will teach you the basics of diabetes care. They can help answer questions you may have. Learn about diabetes and make healthy choices regarding eating and physical activity. Monitor your blood glucose level regularly. Your health care provider will help you decide how often to check your blood glucose level depending on your treatment goals and how well you are meeting them.  DO NOT USE NICOTINE Nicotine and diabetes are a dangerous combination. Nicotine raises your risk for diabetes problems. If you quit using nicotine, you will lower your risk for heart attack, stroke, nerve disease, and kidney disease. Your cholesterol and your blood pressure levels may improve. Your blood circulation will also improve. Do not use any tobacco products, including cigarettes, chewing tobacco, or electronic cigarettes. If you need help quitting, ask your health care provider. KEEP YOUR BLOOD PRESSURE UNDER CONTROL Keeping your blood pressure under control will help prevent damage to your eyes, kidneys, heart, and blood vessels. Blood pressure consists of two numbers. The top number should be below 120, and the bottom number should be below 80 (120/80). Keep your blood pressure as close to these numbers as you can. If you already have kidney disease, you may want even lower blood pressure to protect your kidneys. Talk to your health care provider to make sure that your blood pressure goal is right for your needs. Meal planning, medicines, and exercise can help you reach your blood pressure target. Have your blood pressure checked at every visit with your health care provider. KEEP YOUR CHOLESTEROL UNDER CONTROL Normal cholesterol levels will help prevent heart  disease and stroke. These are the biggest health problems for people with diabetes. Keeping cholesterol levels under control can also help with blood flow. Have your cholesterol level checked at least once a year. Your health care provider may prescribe a medicine known as a statin. Statins lower your cholesterol. If you are not taking a statin, ask your health care provider if you should be. Meal planning, exercise, and medicines can help you reach your cholesterol targets.  SCHEDULE AND KEEP YOUR ANNUAL PHYSICAL EXAMS AND EYE EXAMS Your health care provider will tell you how often he or she wants to see you depending on your plan of treatment. It is important that you keep these appointments so that possible problems can be identified early and complications can be avoided or treated.  Every visit with your health care provider should include your weight, blood pressure, and an evaluation of your blood glucose control.  Your hemoglobin A1c should be checked:  At least twice a year if you are at your goal.  Every 3 months if there are changes in treatment.  If you are not meeting your goals.  Your blood lipids should be checked yearly. You should also be checked yearly to see if you have protein in your urine (microalbumin).  Schedule a dilated eye exam within 5 years of your diagnosis if you have type 1  diabetes, and then yearly. Schedule a dilated eye exam at diagnosis if you have type 2 diabetes, and then yearly. All exams thereafter can be extended to every 2 to 3 years if one or more exams have been normal. KEEP YOUR VACCINES CURRENT The flu vaccine is recommended yearly. The formula for the vaccine changes every year and needs to be updated for the best protection against current viruses. It is recommended that people with diabetes who are over 32 years old get the pneumonia vaccine. In some cases, two separate shots may be given. Ask your health care provider if your pneumonia vaccination  is up-to-date. However, there are some instances where another vaccine is recommended. Check with your health care provider. TAKE CARE OF YOUR FEET  Diabetes may cause you to have a poor blood supply (circulation) to your legs and feet. Because of this, the skin may be thinner, break easier, and heal more slowly. You also may have nerve damage in your legs and feet, causing decreased feeling. You may not notice minor injuries to your feet that could lead to serious problems or infections. Taking care of your feet is very important. Visual foot exams are performed at every routine medical visit. The exams check for cuts, injuries, or other problems with the feet. A comprehensive foot exam should be done yearly. This includes visual inspection as well as assessing foot pulses and testing for loss of sensation. You should also do the following:  Inspect your feet daily for cuts, calluses, blisters, ingrown toenails, and signs of infection, such as redness, swelling, or pus.  Wash and dry your feet thoroughly, especially between the toes.  Avoid soaking your feet regularly in hot water baths.  Moisturize dry skin with lotion, avoiding areas between your toes.  Cut toenails straight across and file the edges.  Avoid shoes that do not fit well or have areas that irritate your skin.  Avoid going barefooted or wearing only socks. Your feet need protection. TAKE CARE OF YOUR TEETH People with poorly controlled diabetes are more likely to have gum (periodontal) disease. These infections make diabetes harder to control. Periodontal diseases, if left untreated, can lead to tooth loss. Brush your teeth twice a day, floss, and see your dentist for checkups and cleaning every 6 months, or 2 times a year. ASK YOUR HEALTH CARE PROVIDER ABOUT TAKING ASPIRIN Taking aspirin daily is recommended to help prevent cardiovascular disease in people with and without diabetes. Ask your health care provider if this would  benefit you and what dose he or she would recommend. DRINK RESPONSIBLY Moderate amounts of alcohol (less than 1 drink per day for adult women and less than 2 drinks per day for adult men) have a minimal effect on blood glucose if ingested with food. It is important to eat food with alcohol to avoid hypoglycemia. People should avoid alcohol if they have a history of alcohol abuse or dependence, if they are pregnant, and if they have liver disease, pancreatitis, advanced neuropathy, or severe hypertriglyceridemia. LESSEN STRESS Living with diabetes can be stressful. When you are under stress, your blood glucose may be affected in two ways:  Stress hormones may cause your blood glucose to rise.  You may be distracted from taking good care of yourself. It is a good idea to be aware of your stress level and make changes that are necessary to help you better manage challenging situations. Support groups, planned relaxation, a hobby you enjoy, meditation, healthy relationships, and exercise all  work to lower your stress level. If your efforts do not seem to be helping, get help from your health care provider or a trained mental health professional. Document Released: 07/03/2011 Document Revised: 03/01/2014 Document Reviewed: 12/09/2013 [facility] Patient Information 2015 [place], [place]. This information is not intended to replace advice given to you by your health care provider. Make sure you discuss any questions you have with your health care provider.

## 2014-06-29 ENCOUNTER — Emergency Department (HOSPITAL_COMMUNITY): Payer: Medicare Other

## 2014-06-29 ENCOUNTER — Encounter (HOSPITAL_COMMUNITY): Payer: Self-pay | Admitting: Emergency Medicine

## 2014-06-29 ENCOUNTER — Emergency Department (HOSPITAL_COMMUNITY)
Admission: EM | Admit: 2014-06-29 | Discharge: 2014-06-30 | Disposition: A | Discharge status: Home / Self Care | Payer: Medicare Other | Attending: Emergency Medicine | Admitting: Emergency Medicine

## 2014-06-29 DIAGNOSIS — R42 Dizziness and giddiness: Secondary | ICD-10-CM | POA: Diagnosis not present

## 2014-06-29 DIAGNOSIS — Z79899 Other long term (current) drug therapy: Secondary | ICD-10-CM | POA: Diagnosis not present

## 2014-06-29 DIAGNOSIS — J449 Chronic obstructive pulmonary disease, unspecified: Secondary | ICD-10-CM | POA: Diagnosis not present

## 2014-06-29 DIAGNOSIS — R739 Hyperglycemia, unspecified: Secondary | ICD-10-CM

## 2014-06-29 DIAGNOSIS — R110 Nausea: Secondary | ICD-10-CM | POA: Insufficient documentation

## 2014-06-29 DIAGNOSIS — Z87891 Personal history of nicotine dependence: Secondary | ICD-10-CM | POA: Insufficient documentation

## 2014-06-29 DIAGNOSIS — E119 Type 2 diabetes mellitus without complications: Secondary | ICD-10-CM | POA: Insufficient documentation

## 2014-06-29 DIAGNOSIS — R55 Syncope and collapse: Secondary | ICD-10-CM | POA: Diagnosis not present

## 2014-06-29 LAB — BASIC METABOLIC PANEL
Anion gap: 16 — ABNORMAL HIGH (ref 5–15)
BUN: 19 mg/dL (ref 6–23)
CALCIUM: 9.4 mg/dL (ref 8.4–10.5)
CO2: 28 mEq/L (ref 19–32)
CREATININE: 0.82 mg/dL (ref 0.50–1.35)
Chloride: 82 mEq/L — ABNORMAL LOW (ref 96–112)
GFR calc Af Amer: >90 mL/min (ref >90)
GLUCOSE: 451 mg/dL — AB (ref 70–99)
Potassium: 3.7 mEq/L (ref 3.7–5.3)
Sodium: 126 mEq/L — ABNORMAL LOW (ref 137–147)

## 2014-06-29 LAB — HEPATIC FUNCTION PANEL
ALK PHOS: 121 U/L — AB (ref 39–117)
ALT: 8 U/L (ref 0–53)
AST: 13 U/L (ref 0–37)
Albumin: 3.1 g/dL — ABNORMAL LOW (ref 3.5–5.2)
BILIRUBIN TOTAL: 0.3 mg/dL (ref 0.3–1.2)
Total Protein: 8.2 g/dL (ref 6.0–8.3)

## 2014-06-29 LAB — CBC
HCT: 36.7 % — ABNORMAL LOW (ref 39.0–52.0)
Hemoglobin: 12.6 g/dL — ABNORMAL LOW (ref 13.0–17.0)
MCH: 30.2 pg (ref 26.0–34.0)
MCHC: 34.3 g/dL (ref 30.0–36.0)
MCV: 88 fL (ref 78.0–100.0)
Platelets: 252 10*3/uL (ref 150–400)
RBC: 4.17 MIL/uL — AB (ref 4.22–5.81)
RDW: 13.2 % (ref 11.5–15.5)
WBC: 8.9 10*3/uL (ref 4.0–10.5)

## 2014-06-29 LAB — TROPONIN I: Troponin I: <0.3 ng/mL (ref <0.30)

## 2014-06-29 LAB — LIPASE, BLOOD: Lipase: 41 U/L (ref 11–59)

## 2014-06-29 LAB — CBG MONITORING, ED: Glucose-Capillary: 374 mg/dL — ABNORMAL HIGH (ref 70–99)

## 2014-06-29 MED ORDER — SODIUM CHLORIDE 0.9 % IV BOLUS (SEPSIS)
1000.0000 mL | Freq: Once | INTRAVENOUS | Status: AC
  Administered 2014-06-29: 1000 mL via INTRAVENOUS

## 2014-06-29 NOTE — ED Provider Notes (Signed)
CSN: [id number]     Arrival date & time 06/29/14  1811 History   First MD Initiated Contact with Patient 06/29/14 1901     Chief Complaint  Patient presents with  . Loss of Consciousness  . Dizziness     (Consider location/radiation/quality/duration/timing/severity/associated sxs/prior Treatment) HPI Patient presents after an episode of weakness, near-syncope, with headache. Episode began earlier today, without clear precipitant.  Patient has had ongoing generalized discomfort, nausea, but no vomiting, diarrhea. Patient does not have complete loss of consciousness, but felt lightheaded earlier today. No chest pain, dyspnea. History of present illness is provided by the patient, translator, nurse, and eventually corroborated by family accounts  Past Medical History  Diagnosis Date  . Diabetes mellitus without complication    History reviewed. No pertinent past surgical history. No family history on file. History  Substance Use Topics  . Smoking status: Former Smoker    Types: Cigarettes  . Smokeless tobacco: Former Systems developer    Quit date: 03/29/2014  . Alcohol Use: No    Review of Systems  Constitutional:       Per HPI, otherwise negative  HENT:       Per HPI, otherwise negative  Respiratory:       Per HPI, otherwise negative  Cardiovascular:       Per HPI, otherwise negative  Gastrointestinal: Positive for nausea. Negative for vomiting.  Endocrine:       Negative aside from HPI  Genitourinary:       Neg aside from HPI   Musculoskeletal:       Per HPI, otherwise negative  Skin: Negative.   Neurological: Negative for syncope.      Allergies  Review of patient's allergies indicates no known allergies.  Home Medications   Prior to Admission medications   Medication Sig Start Date End Date Taking? Authorizing Provider  metFORMIN (GLUCOPHAGE) 1000 MG tablet Take 1,000 mg by mouth 2 (two) times daily with a meal.   Yes Historical Provider, MD   BP 163/86  Pulse 81   Temp(Src) 97.6 F (36.4 C) (Oral)  Resp 16  SpO2 99% Physical Exam  Nursing note and vitals reviewed. Constitutional: He is oriented to person, place, and time. He appears well-developed. He has a sickly appearance. No distress.  HENT:  Head: Normocephalic and atraumatic.  edentulous  Eyes: Conjunctivae and EOM are normal.  Cardiovascular: Normal rate and regular rhythm.   Pulmonary/Chest: Effort normal. No stridor. No respiratory distress.  Abdominal: He exhibits no distension.  Musculoskeletal: He exhibits no edema.  Neurological: He is alert and oriented to person, place, and time.  Diffuse atrophy, but patient moves all extremities spontaneously, follows commands appropriately, has no facial asymmetry, and speech is clear.   Skin: Skin is warm and dry.  Psychiatric: He has a normal mood and affect.    ED Course  Procedures (including critical care time) Labs Review Labs Reviewed  CBC - Abnormal; Notable for the following:    RBC 4.17 (*)    Hemoglobin 12.6 (*)    HCT 36.7 (*)    All other components within normal limits  BASIC METABOLIC PANEL - Abnormal; Notable for the following:    Sodium 126 (*)    Chloride 82 (*)    Glucose, Bld 451 (*)    Anion gap 16 (*)    All other components within normal limits  HEPATIC FUNCTION PANEL - Abnormal; Notable for the following:    Albumin 3.1 (*)    Alkaline Phosphatase  121 (*)    All other components within normal limits  TROPONIN I  LIPASE, BLOOD  URINALYSIS, ROUTINE W REFLEX MICROSCOPIC  CBG MONITORING, ED    Imaging Review Dg Chest 2 View  06/29/2014   CLINICAL DATA:  Chest pain with history of diabetes  EXAM: CHEST  2 VIEW  COMPARISON:  Portable chest X ray of May 13, 2014  FINDINGS: The lungs are mildly hyperinflated. There is no focal infiltrate. The heart and pulmonary vascularity are normal. There is no pleural effusion or pneumothorax. The mediastinum is normal in width. The bony thorax is unremarkable.   IMPRESSION: Mild chronic height per inflation is consistent with COPD. There is no acute cardiopulmonary abnormality.   Electronically Signed   By: David  Martinique   On: 06/29/2014 20:57     EKG Interpretation   Date/Time:  Tuesday June 29 2014 19:30:18 EDT Ventricular Rate:  94 PR Interval:  159 QRS Duration: 94 QT Interval:  361 QTC Calculation: 451 R Axis:   82 Text Interpretation:  Sinus rhythm Ventricular premature complex  Borderline right axis deviation Abnormal R-wave progression, late  transition Artifact in lead(s) I II III aVR aVL aVF and baseline wander in  lead(s) V5 Sinus rhythm Artifact Abnormal ekg Confirmed by Carmin Muskrat  MD ([id number]) on 06/29/2014 7:35:38 PM     10:35 PM Patient sitting upright, eating a sandwich, in no distress.  According to the patient and family members the patient is substantially improved. Patient has a primary care followup tomorrow.  MDM   Final diagnoses:  Near syncope  Hyperglycemia    Patient presents with generalized weakness, nausea earlier today, and is found to be hyperglycemic, tachycardic, but otherwise was largely reassuring findings. Patient presents with fluids, food, had no decompensation, nor any evidence of infection, substantial metabolic derangement, or other acute pathology. Patient has followup scheduled for tomorrow morning, and was discharged with family members with return precautions, follow up instructions.    Carmin Muskrat, MD 06/29/14 2237

## 2014-06-29 NOTE — ED Notes (Signed)
Pt reports a headache that started this AM, associated with dizziness. Pt states that his pain is all over,and describes it as throbbing. Pt was able to ambulate to the bathroom, however, he stumbled a few time.

## 2014-06-29 NOTE — Discharge Instructions (Signed)
As discussed, it is very important that you follow up with your physician tomorrow, and be sure to discuss meds emergency department evaluation, in addition to consideration of appropriate medication for hyperglycemia.  Return here for concerning changes in your condition.

## 2014-06-29 NOTE — ED Notes (Addendum)
Pt reports that he has been dizzy since awakening at 0800 today. Pt reports syncopal episode at 0830; falling and hitting head on floor. Pt now reports mild HA, mild blurred vision and continued dizziness. PT denies CP, SOB. Neuro intact. Grips equal, no arm drift. PERRLA, 3 mm. Pt is AO x4.

## 2014-06-30 ENCOUNTER — Ambulatory Visit: Payer: Medicare Other | Admitting: *Deleted

## 2014-07-22 ENCOUNTER — Inpatient Hospital Stay (HOSPITAL_COMMUNITY)
Admission: EM | Admit: 2014-07-22 | Discharge: 2014-07-28 | DRG: 871 | Disposition: A | Discharge status: Home Health | Payer: Medicare Other | Attending: Internal Medicine | Admitting: Internal Medicine

## 2014-07-22 ENCOUNTER — Encounter (HOSPITAL_COMMUNITY): Payer: Self-pay | Admitting: Emergency Medicine

## 2014-07-22 DIAGNOSIS — Z9119 Patient's noncompliance with other medical treatment and regimen: Secondary | ICD-10-CM

## 2014-07-22 DIAGNOSIS — E876 Hypokalemia: Secondary | ICD-10-CM

## 2014-07-22 DIAGNOSIS — E1165 Type 2 diabetes mellitus with hyperglycemia: Secondary | ICD-10-CM | POA: Diagnosis present

## 2014-07-22 DIAGNOSIS — I10 Essential (primary) hypertension: Secondary | ICD-10-CM | POA: Diagnosis present

## 2014-07-22 DIAGNOSIS — E871 Hypo-osmolality and hyponatremia: Secondary | ICD-10-CM | POA: Diagnosis present

## 2014-07-22 DIAGNOSIS — Z681 Body mass index (BMI) 19 or less, adult: Secondary | ICD-10-CM

## 2014-07-22 DIAGNOSIS — Z91199 Patient's noncompliance with other medical treatment and regimen due to unspecified reason: Secondary | ICD-10-CM

## 2014-07-22 DIAGNOSIS — N390 Urinary tract infection, site not specified: Secondary | ICD-10-CM

## 2014-07-22 DIAGNOSIS — I369 Nonrheumatic tricuspid valve disorder, unspecified: Secondary | ICD-10-CM | POA: Diagnosis not present

## 2014-07-22 DIAGNOSIS — I951 Orthostatic hypotension: Secondary | ICD-10-CM | POA: Diagnosis present

## 2014-07-22 DIAGNOSIS — Q619 Cystic kidney disease, unspecified: Secondary | ICD-10-CM | POA: Diagnosis not present

## 2014-07-22 DIAGNOSIS — IMO0001 Reserved for inherently not codable concepts without codable children: Secondary | ICD-10-CM | POA: Diagnosis present

## 2014-07-22 DIAGNOSIS — Z794 Long term (current) use of insulin: Secondary | ICD-10-CM

## 2014-07-22 DIAGNOSIS — Z2821 Immunization not carried out because of patient refusal: Secondary | ICD-10-CM

## 2014-07-22 DIAGNOSIS — E1129 Type 2 diabetes mellitus with other diabetic kidney complication: Secondary | ICD-10-CM | POA: Diagnosis not present

## 2014-07-22 DIAGNOSIS — Z789 Other specified health status: Secondary | ICD-10-CM | POA: Diagnosis not present

## 2014-07-22 DIAGNOSIS — Z603 Acculturation difficulty: Secondary | ICD-10-CM

## 2014-07-22 DIAGNOSIS — R64 Cachexia: Secondary | ICD-10-CM | POA: Diagnosis present

## 2014-07-22 DIAGNOSIS — R739 Hyperglycemia, unspecified: Secondary | ICD-10-CM

## 2014-07-22 DIAGNOSIS — N12 Tubulo-interstitial nephritis, not specified as acute or chronic: Secondary | ICD-10-CM | POA: Diagnosis present

## 2014-07-22 DIAGNOSIS — A412 Sepsis due to unspecified staphylococcus: Principal | ICD-10-CM | POA: Diagnosis present

## 2014-07-22 DIAGNOSIS — R42 Dizziness and giddiness: Secondary | ICD-10-CM | POA: Diagnosis not present

## 2014-07-22 DIAGNOSIS — Z8619 Personal history of other infectious and parasitic diseases: Secondary | ICD-10-CM

## 2014-07-22 DIAGNOSIS — B9561 Methicillin susceptible Staphylococcus aureus infection as the cause of diseases classified elsewhere: Secondary | ICD-10-CM

## 2014-07-22 DIAGNOSIS — R1032 Left lower quadrant pain: Secondary | ICD-10-CM | POA: Diagnosis not present

## 2014-07-22 DIAGNOSIS — R197 Diarrhea, unspecified: Secondary | ICD-10-CM | POA: Diagnosis present

## 2014-07-22 DIAGNOSIS — E43 Unspecified severe protein-calorie malnutrition: Secondary | ICD-10-CM

## 2014-07-22 DIAGNOSIS — R7881 Bacteremia: Secondary | ICD-10-CM

## 2014-07-22 DIAGNOSIS — R6510 Systemic inflammatory response syndrome (SIRS) of non-infectious origin without acute organ dysfunction: Secondary | ICD-10-CM

## 2014-07-22 DIAGNOSIS — Z87891 Personal history of nicotine dependence: Secondary | ICD-10-CM | POA: Diagnosis not present

## 2014-07-22 DIAGNOSIS — A419 Sepsis, unspecified organism: Secondary | ICD-10-CM | POA: Diagnosis present

## 2014-07-22 DIAGNOSIS — IMO0002 Reserved for concepts with insufficient information to code with codable children: Secondary | ICD-10-CM

## 2014-07-22 DIAGNOSIS — N281 Cyst of kidney, acquired: Secondary | ICD-10-CM

## 2014-07-22 DIAGNOSIS — D72819 Decreased white blood cell count, unspecified: Secondary | ICD-10-CM

## 2014-07-22 DIAGNOSIS — A4901 Methicillin susceptible Staphylococcus aureus infection, unspecified site: Secondary | ICD-10-CM | POA: Diagnosis not present

## 2014-07-22 DIAGNOSIS — E119 Type 2 diabetes mellitus without complications: Secondary | ICD-10-CM | POA: Diagnosis not present

## 2014-07-22 LAB — COMPREHENSIVE METABOLIC PANEL
ALBUMIN: 3 g/dL — AB (ref 3.5–5.2)
ALT: 9 U/L (ref 0–53)
ANION GAP: 17 — AB (ref 5–15)
AST: 13 U/L (ref 0–37)
Alkaline Phosphatase: 125 U/L — ABNORMAL HIGH (ref 39–117)
BUN: 14 mg/dL (ref 6–23)
CO2: 31 mEq/L (ref 19–32)
Calcium: 9.4 mg/dL (ref 8.4–10.5)
Chloride: 86 mEq/L — ABNORMAL LOW (ref 96–112)
Creatinine, Ser: 0.69 mg/dL (ref 0.50–1.35)
GFR calc Af Amer: >90 mL/min (ref >90)
GFR calc non Af Amer: >90 mL/min (ref >90)
Glucose, Bld: 373 mg/dL — ABNORMAL HIGH (ref 70–99)
POTASSIUM: 3.9 meq/L (ref 3.7–5.3)
SODIUM: 134 meq/L — AB (ref 137–147)
TOTAL PROTEIN: 8.9 g/dL — AB (ref 6.0–8.3)
Total Bilirubin: 0.5 mg/dL (ref 0.3–1.2)

## 2014-07-22 LAB — BASIC METABOLIC PANEL
ANION GAP: 17 — AB (ref 5–15)
BUN: 14 mg/dL (ref 6–23)
CO2: 29 meq/L (ref 19–32)
Calcium: 9.1 mg/dL (ref 8.4–10.5)
Chloride: 95 mEq/L — ABNORMAL LOW (ref 96–112)
Creatinine, Ser: 0.78 mg/dL (ref 0.50–1.35)
GFR calc Af Amer: >90 mL/min (ref >90)
GFR calc non Af Amer: >90 mL/min (ref >90)
Glucose, Bld: 299 mg/dL — ABNORMAL HIGH (ref 70–99)
Potassium: 3.6 mEq/L — ABNORMAL LOW (ref 3.7–5.3)
SODIUM: 141 meq/L (ref 137–147)

## 2014-07-22 LAB — CBC WITH DIFFERENTIAL/PLATELET
BASOS PCT: 0 % (ref 0–1)
Basophils Absolute: 0 10*3/uL (ref 0.0–0.1)
EOS ABS: 0 10*3/uL (ref 0.0–0.7)
Eosinophils Relative: 0 % (ref 0–5)
HCT: 36.7 % — ABNORMAL LOW (ref 39.0–52.0)
HEMOGLOBIN: 12.6 g/dL — AB (ref 13.0–17.0)
Lymphocytes Relative: 18 % (ref 12–46)
Lymphs Abs: 1.5 10*3/uL (ref 0.7–4.0)
MCH: 29.7 pg (ref 26.0–34.0)
MCHC: 34.3 g/dL (ref 30.0–36.0)
MCV: 86.6 fL (ref 78.0–100.0)
Monocytes Absolute: 0.3 10*3/uL (ref 0.1–1.0)
Monocytes Relative: 4 % (ref 3–12)
NEUTROS PCT: 78 % — AB (ref 43–77)
Neutro Abs: 6.2 10*3/uL (ref 1.7–7.7)
PLATELETS: 177 10*3/uL (ref 150–400)
RBC: 4.24 MIL/uL (ref 4.22–5.81)
RDW: 13.8 % (ref 11.5–15.5)
WBC: 8.1 10*3/uL (ref 4.0–10.5)

## 2014-07-22 LAB — URINALYSIS, ROUTINE W REFLEX MICROSCOPIC
Bilirubin Urine: NEGATIVE
Ketones, ur: 15 mg/dL — AB
Nitrite: POSITIVE — AB
Protein, ur: 30 mg/dL — AB
SPECIFIC GRAVITY, URINE: 1.024 (ref 1.005–1.030)
UROBILINOGEN UA: 0.2 mg/dL (ref 0.0–1.0)
pH: 6 (ref 5.0–8.0)

## 2014-07-22 LAB — POC OCCULT BLOOD, ED: FECAL OCCULT BLD: NEGATIVE

## 2014-07-22 LAB — URINE MICROSCOPIC-ADD ON

## 2014-07-22 LAB — GLUCOSE, CAPILLARY
GLUCOSE-CAPILLARY: 326 mg/dL — AB (ref 70–99)
Glucose-Capillary: 467 mg/dL — ABNORMAL HIGH (ref 70–99)

## 2014-07-22 LAB — LIPASE, BLOOD: Lipase: 32 U/L (ref 11–59)

## 2014-07-22 MED ORDER — SODIUM CHLORIDE 0.9 % IV SOLN
1000.0000 mL | Freq: Once | INTRAVENOUS | Status: AC
  Administered 2014-07-22: 1000 mL via INTRAVENOUS

## 2014-07-22 MED ORDER — IOHEXOL 300 MG/ML  SOLN
25.0000 mL | INTRAMUSCULAR | Status: AC
  Administered 2014-07-22 (×2): 25 mL via ORAL

## 2014-07-22 MED ORDER — ONDANSETRON HCL 4 MG/2ML IJ SOLN
4.0000 mg | Freq: Four times a day (QID) | INTRAMUSCULAR | Status: DC | PRN
Start: 2014-07-22 — End: 2014-07-29

## 2014-07-22 MED ORDER — HYDRALAZINE HCL 20 MG/ML IJ SOLN: 5.0000 mg | INTRAMUSCULAR | Status: DC | PRN

## 2014-07-22 MED ORDER — SODIUM CHLORIDE 0.9 % IV SOLN
INTRAVENOUS | Status: DC
  Administered 2014-07-22 – 2014-07-24 (×5): via INTRAVENOUS

## 2014-07-22 MED ORDER — LISINOPRIL 2.5 MG PO TABS
2.5000 mg | ORAL_TABLET | Freq: Every day | ORAL | Status: DC
  Administered 2014-07-22 – 2014-07-23 (×2): 2.5 mg via ORAL
  Filled 2014-07-22 (×3): qty 1

## 2014-07-22 MED ORDER — SODIUM CHLORIDE 0.9 % IV SOLN
INTRAVENOUS | Status: DC
  Administered 2014-07-22: 17:00:00 via INTRAVENOUS

## 2014-07-22 MED ORDER — DEXTROSE 5 % IV SOLN
1.0000 g | INTRAVENOUS | Status: DC
  Administered 2014-07-23: 1 g via INTRAVENOUS
  Filled 2014-07-22 (×2): qty 10

## 2014-07-22 MED ORDER — ENOXAPARIN SODIUM 40 MG/0.4ML ~~LOC~~ SOLN: 40.0000 mg | SUBCUTANEOUS | Status: DC

## 2014-07-22 MED ORDER — ONDANSETRON HCL 4 MG PO TABS
4.0000 mg | ORAL_TABLET | Freq: Four times a day (QID) | ORAL | Status: DC | PRN
Start: 2014-07-22 — End: 2014-07-29

## 2014-07-22 MED ORDER — INSULIN ASPART 100 UNIT/ML ~~LOC~~ SOLN
0.0000 [IU] | Freq: Three times a day (TID) | SUBCUTANEOUS | Status: DC
  Administered 2014-07-22: 15 [IU] via SUBCUTANEOUS
  Administered 2014-07-23: 5 [IU] via SUBCUTANEOUS
  Administered 2014-07-23: 11 [IU] via SUBCUTANEOUS
  Administered 2014-07-23: 2 [IU] via SUBCUTANEOUS
  Administered 2014-07-24 (×2): 8 [IU] via SUBCUTANEOUS
  Administered 2014-07-25 (×2): 3 [IU] via SUBCUTANEOUS
  Administered 2014-07-25: 8 [IU] via SUBCUTANEOUS
  Administered 2014-07-26: 5 [IU] via SUBCUTANEOUS
  Administered 2014-07-26 – 2014-07-27 (×2): 3 [IU] via SUBCUTANEOUS
  Administered 2014-07-27: 11 [IU] via SUBCUTANEOUS
  Administered 2014-07-27 – 2014-07-28 (×2): 8 [IU] via SUBCUTANEOUS
  Administered 2014-07-28: 5 [IU] via SUBCUTANEOUS

## 2014-07-22 MED ORDER — DEXTROSE 5 % IV SOLN
1.0000 g | Freq: Once | INTRAVENOUS | Status: AC
  Administered 2014-07-22: 1 g via INTRAVENOUS
  Filled 2014-07-22: qty 10

## 2014-07-22 MED ORDER — ENOXAPARIN SODIUM 30 MG/0.3ML ~~LOC~~ SOLN
20.0000 mg | SUBCUTANEOUS | Status: DC
  Administered 2014-07-22 – 2014-07-27 (×5): 20 mg via SUBCUTANEOUS
  Filled 2014-07-22 (×8): qty 0.2

## 2014-07-22 MED ORDER — SODIUM CHLORIDE 0.9 % IV SOLN
1000.0000 mL | INTRAVENOUS | Status: DC
  Administered 2014-07-22: 1000 mL via INTRAVENOUS

## 2014-07-22 MED ORDER — INSULIN ASPART 100 UNIT/ML ~~LOC~~ SOLN
0.0000 [IU] | Freq: Every day | SUBCUTANEOUS | Status: DC
  Administered 2014-07-22: 4 [IU] via SUBCUTANEOUS
  Administered 2014-07-25: 5 [IU] via SUBCUTANEOUS

## 2014-07-22 MED ORDER — INSULIN ASPART 100 UNIT/ML ~~LOC~~ SOLN: 0.0000 [IU] | Freq: Three times a day (TID) | SUBCUTANEOUS | Status: DC

## 2014-07-22 NOTE — Progress Notes (Signed)
NURSING PROGRESS NOTE  Brian Owen [id number] Admission Data: 07/22/2014 6:38 PM Attending Provider: Hosie Poisson, MD PCP:No PCP Per Patient Code Status: FULL  Brian Owen is a 63 y.o. male patient admitted from ED:  -No acute distress noted.  -No complaints of shortness of breath.  -No complaints of chest pain.   Blood pressure 164/88, pulse 100, temperature 98.3 F (36.8 C), temperature source Oral, resp. rate 18, height 5\' 5"  (1.651 m), weight 39.4 kg (86 lb 13.8 oz), SpO2 100.00%.   IV Fluids:  IV in place, occlusive dsg intact without redness, IV cath forearm left, condition patent and no redness normal saline.   Allergies:  Review of patient's allergies indicates no known allergies.  Past Medical History:   has a past medical history of Diabetes mellitus without complication.  Past Surgical History:   has past surgical history that includes No past surgeries.  Skin: intact  Patient/Family orientated to room. Information packet given to patient/family. Admission inpatient armband information verified with patient/family to include name and date of birth and placed on patient arm. Side rails up x 2, fall assessment and education completed with patient/family. Patient/family able to verbalize understanding of risk associated with falls and verbalized understanding to call for assistance before getting out of bed. Call light within reach. Patient/family able to voice and demonstrate understanding of unit orientation instructions.    Will continue to evaluate and treat per MD orders.  Wallie Renshaw, RN

## 2014-07-22 NOTE — ED Provider Notes (Signed)
CSN: [id number]     Arrival date & time 07/22/14  [id number] History   First MD Initiated Contact with Patient 07/22/14 1143     Chief Complaint  Patient presents with  . Weakness    HPI Pt had an episode of weakness this am.  He has been having trouble with diarrhea for the last few weeks.  No blood in the stool.  Family has noticed that when he gets up to go to the bathroom it leaks on the floor.  Maybe 1-4 times per day.  NO vomiting.  No complaints of pain.  No fevers.  This morning he went to go to the bathroom and had trouble getting up after.  Family had to help him.  He has been losing weight as well.    Past Medical History  Diagnosis Date  . Diabetes mellitus without complication    History reviewed. No pertinent past surgical history. No family history on file. History  Substance Use Topics  . Smoking status: Former Smoker    Types: Cigarettes  . Smokeless tobacco: Former Systems developer    Quit date: 03/29/2014  . Alcohol Use: No    Review of Systems  Constitutional: Positive for fatigue. Negative for fever.  Respiratory: Negative for shortness of breath.   Cardiovascular: Negative for chest pain.  Gastrointestinal: Negative for abdominal pain.  All other systems reviewed and are negative.     Allergies  Review of patient's allergies indicates no known allergies.  Home Medications   Prior to Admission medications   Medication Sig Start Date End Date Taking? Authorizing Provider  metFORMIN (GLUCOPHAGE) 1000 MG tablet Take 1,000 mg by mouth 2 (two) times daily with a meal.   Yes Historical Provider, MD   BP 157/90  Pulse 75  Temp(Src) 97.6 F (36.4 C) (Oral)  Resp 16  SpO2 99% Physical Exam  ED Course  Procedures (including critical care time) Labs Review Labs Reviewed  CBC WITH DIFFERENTIAL - Abnormal; Notable for the following:    Hemoglobin 12.6 (*)    HCT 36.7 (*)    Neutrophils Relative % 78 (*)    All other components within normal limits  COMPREHENSIVE  METABOLIC PANEL - Abnormal; Notable for the following:    Sodium 134 (*)    Chloride 86 (*)    Glucose, Bld 373 (*)    Total Protein 8.9 (*)    Albumin 3.0 (*)    Alkaline Phosphatase 125 (*)    Anion gap 17 (*)    All other components within normal limits  URINALYSIS, ROUTINE W REFLEX MICROSCOPIC - Abnormal; Notable for the following:    APPearance HAZY (*)    Glucose, UA >1000 (*)    Hgb urine dipstick LARGE (*)    Ketones, ur 15 (*)    Protein, ur 30 (*)    Nitrite POSITIVE (*)    Leukocytes, UA SMALL (*)    All other components within normal limits  URINE MICROSCOPIC-ADD ON - Abnormal; Notable for the following:    Squamous Epithelial / LPF FEW (*)    Bacteria, UA FEW (*)    All other components within normal limits  CLOSTRIDIUM DIFFICILE BY PCR  LIPASE, BLOOD  POC OCCULT BLOOD, ED    Medications  0.9 %  sodium chloride infusion (1,000 mLs Intravenous New Bag/Given 07/22/14 1236)    Followed by  0.9 %  sodium chloride infusion (1,000 mLs Intravenous New Bag/Given 07/22/14 1236)  cefTRIAXone (ROCEPHIN) 1 g in dextrose 5 %  50 mL IVPB (not administered)     MDM   Final diagnoses:  UTI (lower urinary tract infection)  Hyperglycemia    Pt has a uti based on his labs.  He has persistent hyperglycemia without acidosis.  Old records were reviewed.  There are notes indicating that insulin tx has been discussed with the patient has not been interested.  Suspect his weakness is related to infection.  Nl vitals in the ed.  Will check orthostatics and see if he can ambulate.  Overall pt is very thin.   Will need monitoring of his dietary intake and serial weights  1500   Pt is orthostatic.  Plan on IV fluids, admit for further treatment.    Dorie Rank, MD 07/22/14 [phone]

## 2014-07-22 NOTE — ED Notes (Signed)
Patient states went to bathroom this morning and his legs "kinda gave out".  Patient states increased weakness for last 2 weeks.   Patient states has had big weight loss, but unable to state what he weighs or what he did weigh.   Patient denies other symptoms.

## 2014-07-22 NOTE — H&P (Signed)
[facility] History and Physical  Bernabe Dorce FEX:[id number] DOB: [birth year] DOA: 07/22/2014  Referring physician: Dr Tomi Bamberger PCP: No PCP Per Patient   Chief Complaint: abdominal pain.  HPI: Brian Owen is a 63 y.o. male who speaks [place], has a h/o of hypertension( not on any medications), DM( takes metformin) , very noncompliant to meds and office visits, comes in for worsening abdominal pain, nausea, dizziness since last night. Patient is a poor historian, no family at bedside. Most of the history obtained from the patient was obtained using the interpretor and from [place]. As per the patient he had severe left lower quadrant pain and could not sleep and also reports diarrhea. This morning he went to use the bathroom and felt very dizzy. He was brought to ED by a family member. On arrival to ED, his orthostatics were negative. Hi cbgs were elevated in 300 to 400's without any acidosis. His UA revealed UTI. He was referred to medical service for admission , for evaluation of left lower quadrant abdominal pain , diarrhea and UTI.    Review of Systems:  Poor historian, complete history could not be obtained.    Past Medical History  Diagnosis Date  . Diabetes mellitus without complication    Past Surgical History  Procedure Laterality Date  . No past surgeries     Social History:  reports that he has quit smoking. His smoking use included Cigarettes. He smoked 0.00 packs per day. He quit smokeless tobacco use about 3 months ago. He reports that he does not drink alcohol or use illicit drugs.  No Known Allergies  History reviewed. No pertinent family history.   Prior to Admission medications   Medication Sig Start Date End Date Taking? Authorizing Provider  metFORMIN (GLUCOPHAGE) 1000 MG tablet Take 1,000 mg by mouth 2 (two) times daily with a meal.   Yes Historical Provider, MD   Physical Exam: Filed Vitals:   07/22/14 1452 07/22/14 1454 07/22/14 1545 07/22/14 1700  BP:  116/74  128/77 164/88  Pulse:  106 96 100  Temp:    98.3 F (36.8 C)  TempSrc:    Oral  Resp:  '18 17 18  ' Height:    '5\' 5"'  (1.651 m)  Weight: 39.463 kg (87 lb)   39.4 kg (86 lb 13.8 oz)  SpO2:  100% 98% 100%    Wt Readings from Last 3 Encounters:  07/22/14 39.4 kg (86 lb 13.8 oz)  06/23/14 44.906 kg (99 lb)  05/17/14 45.36 kg (100 lb)    General:  Appears calm and comfortable, cachetic looking. Malnutritioned.  Eyes: PERRL, normal lids, irises & conjunctiva Neck: no LAD, masses or thyromegaly Cardiovascular: RRR, no m/r/g. No LE edema. Respiratory: CTA bilaterally, no w/r/r. Normal respiratory effort. Abdomen: soft, ntnd, scaphoid. Skin: no rash or induration seen on limited exam Musculoskeletal: grossly normal tone BUE/BLE Neurologic: grossly non-focal.          Labs on Admission:  Basic Metabolic Panel:  Recent Labs Lab 07/22/14 1236  NA 134*  K 3.9  CL 86*  CO2 31  GLUCOSE 373*  BUN 14  CREATININE 0.69  CALCIUM 9.4   Liver Function Tests:  Recent Labs Lab 07/22/14 1236  AST 13  ALT 9  ALKPHOS 125*  BILITOT 0.5  PROT 8.9*  ALBUMIN 3.0*    Recent Labs Lab 07/22/14 1236  LIPASE 32   No results found for this basename: AMMONIA,  in the last 168 hours CBC:  Recent Labs Lab 07/22/14  1236  WBC 8.1  NEUTROABS 6.2  HGB 12.6*  HCT 36.7*  MCV 86.6  PLT 177   Cardiac Enzymes: No results found for this basename: CKTOTAL, CKMB, CKMBINDEX, TROPONINI,  in the last 168 hours  BNP (last 3 results) No results found for this basename: PROBNP,  in the last 8760 hours CBG:  Recent Labs Lab 07/22/14 1702  GLUCAP 467*    Radiological Exams on Admission: No results found.  EKG pending.   Assessment/Plan Active Problems:   UTI (lower urinary tract infection)   Dizziness diabetes mellitus.   Dizziness:  Unclear etiology. EKG ordered. Orthostatics negative. Gentle hydration .   Left lower quadrant pain, diarrhea: - evaluate for c diff, pcr sent by  EDP.  - ct abdomen and pelvis.  - elevated alk phos, transaminases and lipase normal.  UTI: - resume rocephin and urine cultures pending.    Diabetes Mellitus: Not well controlled. No acidosis, bicarb is 31. And gap is closed. Ketones in urine. On metformin at home , which is being held.  On SSI moderate scale.  hgba1c is ordered and pending. He might need to be started on long acting soon.   Mild hyponatremia:  Accelerated hypertension: Not on meds at home. Will start on low dose ace. Prn hydralazine.   DVT prophylaxis   Code Status: presumed full code.  DVT Prophylaxis: Family Communication: none at bedside.  Disposition Plan: admit to med surg.   Time spent: 65 min  [facility] Pager [phone]

## 2014-07-22 NOTE — Progress Notes (Signed)
Report received from [facility] for patient to be admitted into 5w15

## 2014-07-23 ENCOUNTER — Inpatient Hospital Stay (HOSPITAL_COMMUNITY): Payer: Medicare Other

## 2014-07-23 ENCOUNTER — Encounter (HOSPITAL_COMMUNITY): Payer: Self-pay

## 2014-07-23 DIAGNOSIS — E871 Hypo-osmolality and hyponatremia: Secondary | ICD-10-CM

## 2014-07-23 DIAGNOSIS — R6510 Systemic inflammatory response syndrome (SIRS) of non-infectious origin without acute organ dysfunction: Secondary | ICD-10-CM

## 2014-07-23 DIAGNOSIS — R197 Diarrhea, unspecified: Secondary | ICD-10-CM

## 2014-07-23 LAB — GLUCOSE, CAPILLARY
GLUCOSE-CAPILLARY: 319 mg/dL — AB (ref 70–99)
GLUCOSE-CAPILLARY: 142 mg/dL — AB (ref 70–99)
Glucose-Capillary: 246 mg/dL — ABNORMAL HIGH (ref 70–99)
Glucose-Capillary: 144 mg/dL — ABNORMAL HIGH (ref 70–99)

## 2014-07-23 LAB — HEMOGLOBIN A1C
Hgb A1c MFr Bld: 15.7 % — ABNORMAL HIGH (ref <5.7)
MEAN PLASMA GLUCOSE: 404 mg/dL — AB (ref <117)

## 2014-07-23 LAB — CLOSTRIDIUM DIFFICILE BY PCR: Toxigenic C. Difficile by PCR: NEGATIVE

## 2014-07-23 MED ORDER — IOHEXOL 300 MG/ML  SOLN
80.0000 mL | Freq: Once | INTRAMUSCULAR | Status: AC | PRN
  Administered 2014-07-23: 80 mL via INTRAVENOUS

## 2014-07-23 MED ORDER — SACCHAROMYCES BOULARDII 250 MG PO CAPS
250.0000 mg | ORAL_CAPSULE | Freq: Two times a day (BID) | ORAL | Status: DC
  Administered 2014-07-23 – 2014-07-28 (×11): 250 mg via ORAL
  Filled 2014-07-23 (×12): qty 1

## 2014-07-23 MED ORDER — LOPERAMIDE HCL 2 MG PO CAPS: 2.0000 mg | ORAL_CAPSULE | ORAL | Status: DC | PRN

## 2014-07-23 MED ORDER — INSULIN DETEMIR 100 UNIT/ML ~~LOC~~ SOLN
20.0000 [IU] | Freq: Every day | SUBCUTANEOUS | Status: DC
  Administered 2014-07-23 – 2014-07-26 (×4): 20 [IU] via SUBCUTANEOUS
  Filled 2014-07-23 (×4): qty 0.2

## 2014-07-23 NOTE — Progress Notes (Signed)
Inpatient Diabetes Program Recommendations  AACE/ADA: New Consensus Statement on Inpatient Glycemic Control (2013)  Target Ranges:  Prepandial:   less than 140 mg/dL      Peak postprandial:   less than 180 mg/dL (1-2 hours)      Critically ill patients:  140 - 180 mg/dL   Reason for Assessment: Hyperglycemia, A1C of 15.7  Diabetes history: Type 2 Outpatient Diabetes medications: Metformin 1000 mg bid Current orders for Inpatient glycemic control: Levemir 20 units daily starting today, Novolog moderate correction scale tid and HS scale  Note:  In chart review and today's MD note see where patient has left hospital AMA in the past and refused insulin start due to needle phobia.  Note MD is planning to speak further with patient and family (when they are available) to develop plan of treatment.  If patient is agreeable to insulin at discharge, suggest follow-up visits by [place] (if patient meets criteria). Thank you.  Kamaria Lucia S. Marcelline Mates, RN, CNS, CDE Inpatient Diabetes Program, team pager [phone]

## 2014-07-23 NOTE — Progress Notes (Addendum)
PATIENT DETAILS Name: Brian Owen Age: 63 y.o. Sex: male Date of Birth: 1951/03/01 Admit Date: 07/22/2014 Admitting Physician Hosie Poisson, MD PCP:No PCP Per Patient  Subjective: Diarrhea overnight-no major complaints. Speaks some English.  Assessment/Plan: Active Problems: Systemic inflammatory response syndrome - Secondary to pyelonephritis, possible C. difficile colitis. - Treatment IV fluids, IV antibiotics for now, await culture data and stool studies. Recent history of MSSA bacteremia, patient did not complete treatment and signed out [place], blood culture has been repeated.  Bilateral pyelonephritis - Likely causing above. - Currently afebrile, nontoxic looking. - Continue Rocephin-day 2, await culture results  Diarrhea -? Etiology-suspect infectious diarrhea. C. difficile PCR negative. Trial of Imodium, and probiotics. Follow    Hyponatremia - Secondary to dehydration, resolved with IV fluids. Monitor electrolytes periodically.  Recent MSSA bacteremia - Did not complete treatment and signed out against medical advise. Will culture blood today and follow.    Diabetes mellitus type 2, uncontrolled - Unfortunately has refused to be on insulin in the past, A1c at 15.7. - At least one inpatient, will start on Levemir and SSI. Follow CBGs. When more family arrives, will consult both patient and family extensively.  Dizziness - Likely secondary to orthostatic hypotension in the setting of diarrhea/pyelonephritis. - Hydrate, recheck orthostatics tomorrow  Disposition: Remain inpatient  DVT Prophylaxis: Prophylactic Lovenox   Code Status: Full code  Family Communication None at bedside  Procedures:  None  CONSULTS:  None  Time spent 40 minutes-which includes 50% of the time with face-to-face with patient/ family and coordinating care related to the above assessment and plan.    MEDICATIONS: Scheduled Meds: . cefTRIAXone (ROCEPHIN)   IV  1 g Intravenous Q24H  . enoxaparin (LOVENOX) injection  20 mg Subcutaneous Q24H  . insulin aspart  0-15 Units Subcutaneous TID WC  . insulin aspart  0-5 Units Subcutaneous QHS  . insulin detemir  20 Units Subcutaneous Daily  . lisinopril  2.5 mg Oral Daily   Continuous Infusions: . sodium chloride 75 mL/hr at 07/23/14 1015   PRN Meds:.hydrALAZINE, ondansetron (ZOFRAN) IV, ondansetron  Antibiotics: Anti-infectives   Start     Dose/Rate Route Frequency Ordered Stop   07/23/14 1400  cefTRIAXone (ROCEPHIN) 1 g in dextrose 5 % 50 mL IVPB     1 g 100 mL/hr over 30 Minutes Intravenous Every 24 hours 07/22/14 1932     07/22/14 1430  cefTRIAXone (ROCEPHIN) 1 g in dextrose 5 % 50 mL IVPB     1 g 100 mL/hr over 30 Minutes Intravenous  Once 07/22/14 1428 07/22/14 1626       PHYSICAL EXAM: Vital signs in last 24 hours: Filed Vitals:   07/22/14 1942 07/22/14 2153 07/23/14 0529 07/23/14 1014  BP: 119/70 148/79 107/61 118/64  Pulse:  115 92 80  Temp:  99.1 F (37.3 C) 98 F (36.7 C)   TempSrc:  Oral Oral   Resp:  18 18   Height:      Weight:      SpO2:  100% 100%     Weight change:  Filed Weights   07/22/14 1452 07/22/14 1700  Weight: 39.463 kg (87 lb) 39.4 kg (86 lb 13.8 oz)   Body mass index is 14.45 kg/(m^2).   Gen Exam: Awake and alert with clear speech.   Neck: Supple, No JVD.   Chest: B/L Clear.   CVS: S1 S2 Regular, no murmurs.  Abdomen: soft, BS +, non tender, non  distended.  Extremities: no edema, lower extremities warm to touch. Neurologic: Non Focal.   Skin: No Rash.   Wounds: N/A.   Intake/Output from previous day:  Intake/Output Summary (Last 24 hours) at 07/23/14 1137 Last data filed at 07/23/14 1009  Gross per 24 hour  Intake   2935 ml  Output      0 ml  Net   2935 ml     LAB RESULTS: CBC  Recent Labs Lab 07/22/14 1236  WBC 8.1  HGB 12.6*  HCT 36.7*  PLT 177  MCV 86.6  MCH 29.7  MCHC 34.3  RDW 13.8  LYMPHSABS 1.5  MONOABS 0.3    EOSABS 0.0  BASOSABS 0.0    Chemistries   Recent Labs Lab 07/22/14 1236 07/22/14 1934  NA 134* 141  K 3.9 3.6*  CL 86* 95*  CO2 31 29  GLUCOSE 373* 299*  BUN 14 14  CREATININE 0.69 0.78  CALCIUM 9.4 9.1    CBG:  Recent Labs Lab 07/22/14 1702 07/22/14 2222 07/23/14 0758  GLUCAP 467* 326* 246*    GFR Estimated Creatinine Clearance: 52.7 ml/min (by C-G formula based on Cr of 0.78).  Coagulation profile No results found for this basename: INR, PROTIME,  in the last 168 hours  Cardiac Enzymes No results found for this basename: CK, CKMB, TROPONINI, MYOGLOBIN,  in the last 168 hours  No components found with this basename: POCBNP,  No results found for this basename: DDIMER,  in the last 72 hours  Recent Labs  07/22/14 1236  HGBA1C 15.7*   No results found for this basename: CHOL, HDL, LDLCALC, TRIG, CHOLHDL, LDLDIRECT,  in the last 72 hours No results found for this basename: TSH, T4TOTAL, FREET3, T3FREE, THYROIDAB,  in the last 72 hours No results found for this basename: VITAMINB12, FOLATE, FERRITIN, TIBC, IRON, RETICCTPCT,  in the last 72 hours  Recent Labs  07/22/14 1236  LIPASE 32    Urine Studies No results found for this basename: UACOL, UAPR, USPG, UPH, UTP, UGL, UKET, UBIL, UHGB, UNIT, UROB, ULEU, UEPI, UWBC, URBC, UBAC, CAST, CRYS, UCOM, BILUA,  in the last 72 hours  MICROBIOLOGY: Recent Results (from the past 240 hour(s))  CLOSTRIDIUM DIFFICILE BY PCR     Status: None   Collection Time    07/22/14  4:28 PM      Result Value Ref Range Status   C difficile by pcr NEGATIVE  NEGATIVE Final    RADIOLOGY STUDIES/RESULTS: Dg Chest 2 View  06/29/2014   CLINICAL DATA:  Chest pain with history of diabetes  EXAM: CHEST  2 VIEW  COMPARISON:  Portable chest X ray of May 13, 2014  FINDINGS: The lungs are mildly hyperinflated. There is no focal infiltrate. The heart and pulmonary vascularity are normal. There is no pleural effusion or pneumothorax. The  mediastinum is normal in width. The bony thorax is unremarkable.  IMPRESSION: Mild chronic height per inflation is consistent with COPD. There is no acute cardiopulmonary abnormality.   Electronically Signed   By: David  Martinique   On: 06/29/2014 20:57   Ct Abdomen Pelvis W Contrast  07/23/2014   CLINICAL DATA:  Left lower quadrant pain.  EXAM: CT ABDOMEN AND PELVIS WITH CONTRAST  TECHNIQUE: Multidetector CT imaging of the abdomen and pelvis was performed using the standard protocol following bolus administration of intravenous contrast.  CONTRAST:  19mL OMNIPAQUE IOHEXOL 300 MG/ML  SOLN  COMPARISON:  None.  FINDINGS: BODY WALL: Paucity of fat.  LOWER  CHEST: Diffuse coronary atherosclerosis.  ABDOMEN/PELVIS:  Liver: No focal abnormality.  Biliary: No evidence of biliary obstruction or stone.  Pancreas: Unremarkable.  Spleen: Unremarkable.  Adrenals: Unremarkable.  Kidneys and ureters: Patchy enhancement of the bilateral kidneys with a striated appearance favoring pyelonephritis over infarcts. No hydronephrosis. There are 2 left renal cysts which were present on chest CT from 2009, the larger in the upper pole measuring 2 cm. No debris/fluid layers. Additional tiny (~5 mm)cystic areas in the bilateral kidneys could reflect some liquefaction, but no mature or drainable abscess.  Bladder: Moderately distended.  Reproductive: Unremarkable.  Bowel: No obstruction. Negative appendix.  Retroperitoneum: No mass or adenopathy.  Peritoneum: No ascites or pneumoperitoneum.  Vascular: Extensive aortoiliac atherosclerosis.  OSSEOUS: No acute abnormalities.  IMPRESSION: Bilateral pyelonephritis. No hydronephrosis or drainable collection.   Electronically Signed   By: Jorje Guild M.D.   On: 07/23/2014 01:33    Oren Binet, MD  [facility] Pager:[phone]  If 7PM-7AM, please contact night-coverage [email or web address] Password [email or web address] 07/23/2014, 11:37 AM   LOS: 1 day   **Disclaimer: This note may have been  dictated with voice recognition software. Similar sounding words can inadvertently be transcribed and this note may contain transcription errors which may not have been corrected upon publication of note.**

## 2014-07-23 NOTE — Care Management Note (Signed)
    Page 1 of 2   07/28/2014     3:48:30 PM CARE MANAGEMENT NOTE 07/28/2014  Patient:  Schindler,Amori   Account Number:  [id number]  Date Initiated:  07/23/2014  Documentation initiated by:  Tomi Bamberger  Subjective/Objective Assessment:   dx pyelo, diarrhea  admit- lives with family. Speaks [place].     Action/Plan:   Anticipated DC Date:  07/28/2014   Anticipated DC Plan:  [place]  CM consult      [facility] Choice  HOME HEALTH   Choice offered to / List presented to:  C-1 Patient        [place] arranged  HH-1 RN      [place].   Status of service:  Completed, signed off Medicare Important Message given?  YES (If response is "NO", the following Medicare IM given date fields will be blank) Date Medicare IM given:  07/23/2014 Medicare IM given by:  Tomi Bamberger Date Additional Medicare IM given:  07/26/2014 Additional Medicare IM given by:  Tomi Bamberger  Discharge Disposition:  [place]  Per UR Regulation:  Reviewed for med. necessity/level of care/duration of stay  If discussed at [place] of Stay Meetings, dates discussed:    Comments:  07/28/14 [place], BSN [phone] patient is for dc today, patien and family members informed NCM and Staff RN that patient has medicare prescription coverage.  NCM received call from [facility] with [facility] stating they tried to run script through for IV ABX and it would not go through stating that medicare will not be active until October 1.  NCM and Staff RN asked patient this information and he stated that is correct ,  patient will stay at hospital to recieve the iv abx at 8 pm and his bed time insulin and then he will be dc by 8:30 pm. Patient 's insurance will be active tomorrow, NCM will give daughter phone number to call if they have a problem tomorrow getting levemir insulin.  Diabetes educator gave patient a starter kit  as well.   NCM  notiifed Pam with [facility] that patient will get iv abx at 8 pm tonight so they will need to start tomrrow with iv abx.  07/27/14 [place], BSN [place] interpreter present, patient chose [facility], referral made to [facility], Butch Penny notified for [facility] for IV ABX.  Patient is for possible dc tomorrow or Thursday.  Patient lives with daughter and nephew.  07/23/14 [place], BSN [phone] patient lives with family.  PTA indep.  NCM will continue to follow for dc needs.

## 2014-07-24 DIAGNOSIS — E876 Hypokalemia: Secondary | ICD-10-CM

## 2014-07-24 LAB — BASIC METABOLIC PANEL
Anion gap: 13 (ref 5–15)
BUN: 11 mg/dL (ref 6–23)
CO2: 27 mEq/L (ref 19–32)
Calcium: 8.7 mg/dL (ref 8.4–10.5)
Chloride: 98 mEq/L (ref 96–112)
Creatinine, Ser: 0.72 mg/dL (ref 0.50–1.35)
Glucose, Bld: 209 mg/dL — ABNORMAL HIGH (ref 70–99)
POTASSIUM: 3.1 meq/L — AB (ref 3.7–5.3)
SODIUM: 138 meq/L (ref 137–147)

## 2014-07-24 LAB — GLUCOSE, CAPILLARY
GLUCOSE-CAPILLARY: 295 mg/dL — AB (ref 70–99)
GLUCOSE-CAPILLARY: 173 mg/dL — AB (ref 70–99)
Glucose-Capillary: 276 mg/dL — ABNORMAL HIGH (ref 70–99)
Glucose-Capillary: 302 mg/dL — ABNORMAL HIGH (ref 70–99)
Glucose-Capillary: 67 mg/dL — ABNORMAL LOW (ref 70–99)
Glucose-Capillary: 77 mg/dL (ref 70–99)

## 2014-07-24 LAB — CBC
HCT: 32.6 % — ABNORMAL LOW (ref 39.0–52.0)
Hemoglobin: 10.9 g/dL — ABNORMAL LOW (ref 13.0–17.0)
MCH: 29.1 pg (ref 26.0–34.0)
MCHC: 33.4 g/dL (ref 30.0–36.0)
MCV: 87.2 fL (ref 78.0–100.0)
PLATELETS: 171 10*3/uL (ref 150–400)
RBC: 3.74 MIL/uL — AB (ref 4.22–5.81)
RDW: 14.2 % (ref 11.5–15.5)
WBC: 8.8 10*3/uL (ref 4.0–10.5)

## 2014-07-24 LAB — URINE CULTURE: Colony Count: >=100000

## 2014-07-24 MED ORDER — SODIUM CHLORIDE 0.9 % IV SOLN
INTRAVENOUS | Status: DC
  Administered 2014-07-24 – 2014-07-28 (×7): via INTRAVENOUS
  Filled 2014-07-24 (×10): qty 1000

## 2014-07-24 MED ORDER — POTASSIUM CHLORIDE CRYS ER 20 MEQ PO TBCR
40.0000 meq | EXTENDED_RELEASE_TABLET | Freq: Once | ORAL | Status: AC
  Administered 2014-07-24: 40 meq via ORAL
  Filled 2014-07-24: qty 2

## 2014-07-24 MED ORDER — CEFAZOLIN SODIUM-DEXTROSE 2-3 GM-% IV SOLR
2.0000 g | Freq: Three times a day (TID) | INTRAVENOUS | Status: DC
  Administered 2014-07-24 – 2014-07-26 (×6): 2 g via INTRAVENOUS
  Filled 2014-07-24 (×8): qty 50

## 2014-07-24 NOTE — Progress Notes (Signed)
PATIENT DETAILS Name: Brian Owen Age: 63 y.o. Sex: male Date of Birth: 1951/02/11 Admit Date: 07/22/2014 Admitting Physician Hosie Poisson, MD PCP:No PCP Per Patient  Subjective: Diarrhea continues overnight.Denies other complaints  Assessment/Plan: Active Problems: Systemic inflammatory response syndrome - Secondary to pyelonephritis.. - Treatment IV fluids, IV antibiotics for now, await culture data and stool studies. Recent history of MSSA bacteremia, patient did not complete treatment and signed out [place], blood culture has been repeated-pending.  Bilateral pyelonephritis - Likely causing above. - Currently afebrile, nontoxic looking. - Urine culture-prelim-Staph Aureus, given recent hx of MSSA Bacteremia not treated appropriately (signed out AMA)-will stop Rocephin and start Ancef. For now, Day 3 of antimicrobial therapy-Day 1 of Ancef  Diarrhea -? Etiology-suspect infectious diarrhea. C. difficile PCR negative. Trial of Imodium, and probiotics. Follow-await further stool studies    Hyponatremia - Secondary to dehydration, resolved with IV fluids. Monitor electrolytes periodically.  Recent MSSA bacteremia - Did not complete treatment and signed out against medical advise. Await repeat blood culture-given Staph Aureus in Urine cs-will start Ancef on 9/26.     Diabetes mellitus type 2, uncontrolled - Unfortunately has refused to be on insulin in the past, A1c at 15.7. - At least while inpatient, c/w Levemir and SSI. Follow CBGs. When more family arrives, will consult both patient and family extensively.  Orthostatic Hypotension -secondary to diarrhea -stop Lisinopril, c/w IVF, treat diarrhea  Dizziness - Likely secondary to orthostatic hypotension in the setting of diarrhea/pyelonephritis. - supportive care  Disposition: Remain inpatient  DVT Prophylaxis: Prophylactic Lovenox   Code Status: Full code  Family Communication None at  bedside  Procedures:  None  CONSULTS:  None   MEDICATIONS: Scheduled Meds: . enoxaparin (LOVENOX) injection  20 mg Subcutaneous Q24H  . insulin aspart  0-15 Units Subcutaneous TID WC  . insulin aspart  0-5 Units Subcutaneous QHS  . insulin detemir  20 Units Subcutaneous Daily  . potassium chloride  40 mEq Oral Once  . saccharomyces boulardii  250 mg Oral BID   Continuous Infusions: . sodium chloride 0.9 % 1,000 mL with potassium chloride 20 mEq infusion     PRN Meds:.hydrALAZINE, loperamide, ondansetron (ZOFRAN) IV, ondansetron  Antibiotics: Anti-infectives   Start     Dose/Rate Route Frequency Ordered Stop   07/23/14 1400  cefTRIAXone (ROCEPHIN) 1 g in dextrose 5 % 50 mL IVPB  Status:  Discontinued     1 g 100 mL/hr over 30 Minutes Intravenous Every 24 hours 07/22/14 1932 07/24/14 0943   07/22/14 1430  cefTRIAXone (ROCEPHIN) 1 g in dextrose 5 % 50 mL IVPB     1 g 100 mL/hr over 30 Minutes Intravenous  Once 07/22/14 1428 07/22/14 1626       PHYSICAL EXAM: Vital signs in last 24 hours: Filed Vitals:   07/23/14 1340 07/23/14 2100 07/24/14 0700 07/24/14 0937  BP:  129/74 127/71 101/61  Pulse:  94 79   Temp: 98.3 F (36.8 C) 98.6 F (37 C) 98.7 F (37.1 C)   TempSrc: Oral Oral Oral   Resp: 18 18 20    Height:      Weight:      SpO2: 99% 99% 100%     Weight change:  Filed Weights   07/22/14 1452 07/22/14 1700  Weight: 39.463 kg (87 lb) 39.4 kg (86 lb 13.8 oz)   Body mass index is 14.45 kg/(m^2).   Gen Exam: Awake and alert with clear speech.  Neck: Supple, No JVD.   Chest: B/L Clear.   CVS: S1 S2 Regular, no murmurs.  Abdomen: soft, BS +, non tender, non distended.  Extremities: no edema, lower extremities warm to touch. Neurologic: Non Focal.   Skin: No Rash.   Wounds: N/A.   Intake/Output from previous day:  Intake/Output Summary (Last 24 hours) at 07/24/14 1014 Last data filed at 07/23/14 2100  Gross per 24 hour  Intake    240 ml  Output     400 ml  Net   -160 ml     LAB RESULTS: CBC  Recent Labs Lab 07/22/14 1236 07/24/14 0655  WBC 8.1 8.8  HGB 12.6* 10.9*  HCT 36.7* 32.6*  PLT 177 171  MCV 86.6 87.2  MCH 29.7 29.1  MCHC 34.3 33.4  RDW 13.8 14.2  LYMPHSABS 1.5  --   MONOABS 0.3  --   EOSABS 0.0  --   BASOSABS 0.0  --     Chemistries   Recent Labs Lab 07/22/14 1236 07/22/14 1934 07/24/14 0655  NA 134* 141 138  K 3.9 3.6* 3.1*  CL 86* 95* 98  CO2 31 29 27   GLUCOSE 373* 299* 209*  BUN 14 14 11   CREATININE 0.69 0.78 0.72  CALCIUM 9.4 9.1 8.7    CBG:  Recent Labs Lab 07/23/14 0758 07/23/14 1158 07/23/14 1701 07/23/14 2212 07/24/14 0824  GLUCAP 246* 319* 142* 144* 276*    GFR Estimated Creatinine Clearance: 52.7 ml/min (by C-G formula based on Cr of 0.72).  Coagulation profile No results found for this basename: INR, PROTIME,  in the last 168 hours  Cardiac Enzymes No results found for this basename: CK, CKMB, TROPONINI, MYOGLOBIN,  in the last 168 hours  No components found with this basename: POCBNP,  No results found for this basename: DDIMER,  in the last 72 hours  Recent Labs  07/22/14 1236  HGBA1C 15.7*   No results found for this basename: CHOL, HDL, LDLCALC, TRIG, CHOLHDL, LDLDIRECT,  in the last 72 hours No results found for this basename: TSH, T4TOTAL, FREET3, T3FREE, THYROIDAB,  in the last 72 hours No results found for this basename: VITAMINB12, FOLATE, FERRITIN, TIBC, IRON, RETICCTPCT,  in the last 72 hours  Recent Labs  07/22/14 1236  LIPASE 32    Urine Studies No results found for this basename: UACOL, UAPR, USPG, UPH, UTP, UGL, UKET, UBIL, UHGB, UNIT, UROB, ULEU, UEPI, UWBC, URBC, UBAC, CAST, CRYS, UCOM, BILUA,  in the last 72 hours  MICROBIOLOGY: Recent Results (from the past 240 hour(s))  URINE CULTURE     Status: None   Collection Time    07/22/14 12:24 PM      Result Value Ref Range Status   Specimen Description URINE, RANDOM   Final   Special  Requests ADDED [id number]   Final   Culture  Setup Time     Final   Value: 07/22/2014 00:00     Performed at [place]     Final   Value: >=100,000 COLONIES/ML     Performed at [facility]   Culture     Final   Value: STAPHYLOCOCCUS AUREUS     Note: RIFAMPIN AND GENTAMICIN SHOULD NOT BE USED AS SINGLE DRUGS FOR TREATMENT OF STAPH INFECTIONS.     Performed at [facility]   Report Status PENDING   Incomplete  CLOSTRIDIUM DIFFICILE BY PCR     Status: None   Collection  Time    07/22/14  4:28 PM      Result Value Ref Range Status   C difficile by pcr NEGATIVE  NEGATIVE Final    RADIOLOGY STUDIES/RESULTS: Dg Chest 2 View  06/29/2014   CLINICAL DATA:  Chest pain with history of diabetes  EXAM: CHEST  2 VIEW  COMPARISON:  Portable chest X ray of May 13, 2014  FINDINGS: The lungs are mildly hyperinflated. There is no focal infiltrate. The heart and pulmonary vascularity are normal. There is no pleural effusion or pneumothorax. The mediastinum is normal in width. The bony thorax is unremarkable.  IMPRESSION: Mild chronic height per inflation is consistent with COPD. There is no acute cardiopulmonary abnormality.   Electronically Signed   By: David  Martinique   On: 06/29/2014 20:57   Ct Abdomen Pelvis W Contrast  07/23/2014   CLINICAL DATA:  Left lower quadrant pain.  EXAM: CT ABDOMEN AND PELVIS WITH CONTRAST  TECHNIQUE: Multidetector CT imaging of the abdomen and pelvis was performed using the standard protocol following bolus administration of intravenous contrast.  CONTRAST:  11mL OMNIPAQUE IOHEXOL 300 MG/ML  SOLN  COMPARISON:  None.  FINDINGS: BODY WALL: Paucity of fat.  LOWER CHEST: Diffuse coronary atherosclerosis.  ABDOMEN/PELVIS:  Liver: No focal abnormality.  Biliary: No evidence of biliary obstruction or stone.  Pancreas: Unremarkable.  Spleen: Unremarkable.  Adrenals: Unremarkable.  Kidneys and ureters: Patchy enhancement of the bilateral kidneys with  a striated appearance favoring pyelonephritis over infarcts. No hydronephrosis. There are 2 left renal cysts which were present on chest CT from 2009, the larger in the upper pole measuring 2 cm. No debris/fluid layers. Additional tiny (~5 mm)cystic areas in the bilateral kidneys could reflect some liquefaction, but no mature or drainable abscess.  Bladder: Moderately distended.  Reproductive: Unremarkable.  Bowel: No obstruction. Negative appendix.  Retroperitoneum: No mass or adenopathy.  Peritoneum: No ascites or pneumoperitoneum.  Vascular: Extensive aortoiliac atherosclerosis.  OSSEOUS: No acute abnormalities.  IMPRESSION: Bilateral pyelonephritis. No hydronephrosis or drainable collection.   Electronically Signed   By: Jorje Guild M.D.   On: 07/23/2014 01:33    Oren Binet, MD  [facility] Pager:[phone]  If 7PM-7AM, please contact night-coverage [email or web address] Password [email or web address] 07/24/2014, 10:14 AM   LOS: 2 days   **Disclaimer: This note may have been dictated with voice recognition software. Similar sounding words can inadvertently be transcribed and this note may contain transcription errors which may not have been corrected upon publication of note.**

## 2014-07-24 NOTE — Progress Notes (Signed)
ANTIBIOTIC CONSULT NOTE - INITIAL  Pharmacy Consult for Cefazolin Indication: Suspected MSSA Bacteremia  No Known Allergies  Patient Measurements: Height: 5\' 5"  (165.1 cm) Weight: 86 lb 13.8 oz (39.4 kg) IBW/kg (Calculated) : 61.5  Vital Signs: Temp: 98.7 F (37.1 C) (09/26 0700) Temp src: Oral (09/26 0700) BP: 101/61 mmHg (09/26 0937) Pulse Rate: 79 (09/26 0700) Intake/Output from previous day: 09/25 0701 - 09/26 0700 In: 960 [P.O.:740; I.V.:220] Out: 400 [Urine:400] Intake/Output from this shift:    Labs:  Recent Labs  07/22/14 1236 07/22/14 1934 07/24/14 0655  WBC 8.1  --  8.8  HGB 12.6*  --  10.9*  PLT 177  --  171  CREATININE 0.69 0.78 0.72   Estimated Creatinine Clearance: 52.7 ml/min (by C-G formula based on Cr of 0.72). No results found for this basename: Letta Median, VANCORANDOM, GENTTROUGH, GENTPEAK, GENTRANDOM, TOBRATROUGH, TOBRAPEAK, TOBRARND, AMIKACINPEAK, AMIKACINTROU, AMIKACIN,  in the last 72 hours   Microbiology: Recent Results (from the past 720 hour(s))  URINE CULTURE     Status: None   Collection Time    07/22/14 12:24 PM      Result Value Ref Range Status   Specimen Description URINE, RANDOM   Final   Special Requests ADDED [id number]   Final   Culture  Setup Time     Final   Value: 07/22/2014 00:00     Performed at [place]     Final   Value: >=100,000 COLONIES/ML     Performed at [facility]   Culture     Final   Value: STAPHYLOCOCCUS AUREUS     Note: RIFAMPIN AND GENTAMICIN SHOULD NOT BE USED AS SINGLE DRUGS FOR TREATMENT OF STAPH INFECTIONS.     Performed at [facility]   Report Status PENDING   Incomplete  CLOSTRIDIUM DIFFICILE BY PCR     Status: None   Collection Time    07/22/14  4:28 PM      Result Value Ref Range Status   C difficile by pcr NEGATIVE  NEGATIVE Final    Medical History: Past Medical History  Diagnosis Date  . Diabetes mellitus without complication      Medications:  Scheduled:  . enoxaparin (LOVENOX) injection  20 mg Subcutaneous Q24H  . insulin aspart  0-15 Units Subcutaneous TID WC  . insulin aspart  0-5 Units Subcutaneous QHS  . insulin detemir  20 Units Subcutaneous Daily  . saccharomyces boulardii  250 mg Oral BID   Assessment: 70 yoM presents with worsening abdominal pain, nausea, and dizziness. Of note, patient was recently hospitalized with MSSA bacteremia, signed out AMA - never completed treatment. UC positive for staph aureus. Blood cultures pending. Received ceftriaxone 1 g for 2 days. Starting cefazolin 2 g every 8 hours.    Plan:  1) Cefazolin 2 g q8h 2) F/u Cxs and sensitivities  Theron Arista, PharmD Clinical Pharmacist - Resident Pager: [phone] 9/26/201510:15 AM

## 2014-07-25 DIAGNOSIS — E1129 Type 2 diabetes mellitus with other diabetic kidney complication: Secondary | ICD-10-CM

## 2014-07-25 LAB — BASIC METABOLIC PANEL
ANION GAP: 12 (ref 5–15)
BUN: 9 mg/dL (ref 6–23)
CO2: 28 meq/L (ref 19–32)
CREATININE: 0.79 mg/dL (ref 0.50–1.35)
Calcium: 8.4 mg/dL (ref 8.4–10.5)
Chloride: 101 mEq/L (ref 96–112)
GFR calc Af Amer: >90 mL/min (ref >90)
GFR calc non Af Amer: >90 mL/min (ref >90)
GLUCOSE: 152 mg/dL — AB (ref 70–99)
Potassium: 3.4 mEq/L — ABNORMAL LOW (ref 3.7–5.3)
SODIUM: 141 meq/L (ref 137–147)

## 2014-07-25 LAB — GLUCOSE, CAPILLARY
GLUCOSE-CAPILLARY: 197 mg/dL — AB (ref 70–99)
Glucose-Capillary: 152 mg/dL — ABNORMAL HIGH (ref 70–99)
Glucose-Capillary: 283 mg/dL — ABNORMAL HIGH (ref 70–99)
Glucose-Capillary: 359 mg/dL — ABNORMAL HIGH (ref 70–99)

## 2014-07-25 MED ORDER — ACETAMINOPHEN 325 MG PO TABS: 650.0000 mg | ORAL_TABLET | ORAL | Status: DC | PRN

## 2014-07-25 MED ORDER — HYDROCODONE-ACETAMINOPHEN 5-325 MG PO TABS
1.0000 | ORAL_TABLET | Freq: Once | ORAL | Status: AC
  Administered 2014-07-25: 1 via ORAL
  Filled 2014-07-25: qty 1

## 2014-07-25 MED ORDER — POTASSIUM CHLORIDE CRYS ER 20 MEQ PO TBCR
40.0000 meq | EXTENDED_RELEASE_TABLET | Freq: Once | ORAL | Status: AC
  Administered 2014-07-25: 40 meq via ORAL

## 2014-07-25 NOTE — Progress Notes (Signed)
Per Dustin NT, pt had BM and used bleach wipes to clean bottom. Bleach wipes were removed from room. Ghimire MD made aware. Pt currently in bed resting. Denies any pain. Site does look a little red but pt has had diarrhea throughout hospital stay. Dustin NT to complete safety zone. Will continue to monitor.

## 2014-07-25 NOTE — Consult Note (Addendum)
[facility] for Infectious Disease     Reason for Consult: MSSA pyelonephritis and likely disseminated infection    Referring Physician: Dr. Sloan Owen  Active Problems:   Hyponatremia   Diabetes mellitus type 2, uncontrolled   UTI (lower urinary tract infection)   Dizziness   Abdominal pain, left lower quadrant   .  ceFAZolin (ANCEF) IV  2 g Intravenous 3 times per day  . enoxaparin (LOVENOX) injection  20 mg Subcutaneous Q24H  . insulin aspart  0-15 Units Subcutaneous TID WC  . insulin aspart  0-5 Units Subcutaneous QHS  . insulin detemir  20 Units Subcutaneous Daily  . saccharomyces boulardii  250 mg Oral BID    Recommendations: TTE Cefazolin  Consider repeat imaging of his legs bilateral with MRI for residual infection  Dr. Johnnye Owen back tomorrow.   Assessment: He recently had Staph bacteremia from abscess and poorly controlled diabetes now seeded to kidneys.  Concern for endocarditis.    Antibiotics: Ceftriaxone 2 days cefazoline day 2 Total antibiotic days - 4  HPI: Brian Owen is a 63 y.o. male Brian Owen male with poorly controlled diabetes with last A1C of 16 who came to ED 9/24 with nausea, dizziness and positive UA now with MSSA urine culture.  CT c/w bilateral pyelonephritis.  Was hospitalized in July with MSSA bacteremia from bilateral thigh abscess and left AMA.  Had apiration of thigh.  Now back and started on ceftriaxone for UTI.  Blood cultures done after starting antibiotics and remain negative.       Review of Systems: A comprehensive review of systems was negative.  Past Medical History  Diagnosis Date  . Diabetes mellitus without complication     History  Substance Use Topics  . Smoking status: Former Smoker    Types: Cigarettes  . Smokeless tobacco: Former Systems developer    Quit date: 03/29/2014  . Alcohol Use: No    History reviewed. No pertinent family history. No Known Allergies  OBJECTIVE: Blood pressure 136/79, pulse 85, temperature  98.4 F (36.9 C), temperature source Oral, resp. rate 16, height 5\' 5"  (1.651 m), weight 86 lb 13.8 oz (39.4 kg), SpO2 98.00%. General: awake, in bed, thin appearing Skin: multiple healed hyperpigmented lesions on skin Lungs: CTA B Cor: RRR without m Abdomen: Soft, nt, nd Ext: no Osler nodes  Microbiology: Recent Results (from the past 240 hour(s))  URINE CULTURE     Status: None   Collection Time    07/22/14 12:24 PM      Result Value Ref Range Status   Specimen Description URINE, RANDOM   Final   Special Requests ADDED [id number]   Final   Culture  Setup Time     Final   Value: 07/22/2014 00:00     Performed at [place]     Final   Value: >=100,000 COLONIES/ML     Performed at [facility]   Culture     Final   Value: STAPHYLOCOCCUS AUREUS     Note: RIFAMPIN AND GENTAMICIN SHOULD NOT BE USED AS SINGLE DRUGS FOR TREATMENT OF STAPH INFECTIONS.     Performed at [facility]   Report Status 07/24/2014 FINAL   Final   Organism ID, Bacteria STAPHYLOCOCCUS AUREUS   Final  CLOSTRIDIUM DIFFICILE BY PCR     Status: None   Collection Time    07/22/14  4:28 PM      Result Value Ref Range Status   C  difficile by pcr NEGATIVE  NEGATIVE Final  CULTURE, BLOOD (ROUTINE X 2)     Status: None   Collection Time    07/23/14  9:20 AM      Result Value Ref Range Status   Specimen Description BLOOD RIGHT ANTECUBITAL   Final   Special Requests BOTTLES DRAWN AEROBIC AND ANAEROBIC 4.0CCS   Final   Culture  Setup Time     Final   Value: 07/23/2014 14:23     Performed at [facility]   Culture     Final   Value:        BLOOD CULTURE RECEIVED NO GROWTH TO DATE CULTURE WILL BE HELD FOR 5 DAYS BEFORE ISSUING A FINAL NEGATIVE REPORT     Performed at [facility]   Report Status PENDING   Incomplete  CULTURE, BLOOD (ROUTINE X 2)     Status: None   Collection Time    07/23/14  9:30 AM      Result Value Ref Range Status   Specimen  Description BLOOD RIGHT ARM   Final   Special Requests BOTTLES DRAWN AEROBIC ONLY 4CC   Final   Culture  Setup Time     Final   Value: 07/23/2014 14:22     Performed at [facility]   Culture     Final   Value:        BLOOD CULTURE RECEIVED NO GROWTH TO DATE CULTURE WILL BE HELD FOR 5 DAYS BEFORE ISSUING A FINAL NEGATIVE REPORT     Performed at [facility]   Report Status PENDING   Incomplete    Brian Owen, Brian Owen, Brian Owen for Infectious Disease [name] [email or web address] [phone] pager  [phone] cell 07/25/2014, 12:43 PM

## 2014-07-25 NOTE — Progress Notes (Addendum)
PATIENT DETAILS Name: Brian Owen Age: 63 y.o. Sex: male Date of Birth: 04-15-1951 Admit Date: 07/22/2014 Admitting Physician Hosie Poisson, MD PCP:No PCP Per Patient  Subjective: Diarrhea seems to have slowed down  Assessment/Plan: Active Problems: Systemic inflammatory response syndrome - Secondary to pyelonephritis.. - Treated with IV fluids, IV antibiotics for now, await further culture data and stool studies. Recent (July 2015) history of MSSA bacteremia, patient did not complete treatment and signed out [place], blood culture repeated on 9/25-while on Rocephin-negative so far-but given untreated MSSA bacteremia-now pyelonephritis-concerned that patient may have seeded kidneys (CT Abd shows has tiny cystic areas in b/l kidneys). Will d/w ID-regarding further course/plan.  Bilateral pyelonephritis - Likely causing above. - Currently afebrile, nontoxic looking. - Urine culture-MSSA, given recent hx  (July 2015) of MSSA Bacteremia not treated appropriately (signed out AMA)- stopped Rocephin and started Ancef. Given CT Abd shows has tiny cystic areas in b/l kidneys,concerned that patient may have seeded kidneys For now, Day 4 of antimicrobial therapy-Day 2 of Ancef. Will d/w ID  Diarrhea -? Etiology-suspect infectious diarrhea. C. difficile PCR negative. Trial of Imodium, and probiotics. Follow-await further stool culture results and GI pathogen panel.    Hyponatremia - Secondary to dehydration, resolved with IV fluids. Monitor electrolytes periodically.  Hypokalemia -secondary to GI loss-replete and recheck in am  Recent MSSA bacteremia/Leg abscess - Did not complete treatment and signed out against medical advise. No evidence any residual thigh/leg abscess on exam.Await repeat blood culture-given Staph Aureus in Urine cs-started Ancef on 9/26. See above for further details    Diabetes mellitus type 2, uncontrolled - Unfortunately has refused to be on insulin  in the past, A1c at 15.7. - At least while inpatient, c/w Levemir and SSI. Follow CBGs. When more family arrives, will counsel both patient and family extensively.  Orthostatic Hypotension -secondary to diarrhea -stop Lisinopril, c/w IVF, treat diarrhea  Dizziness - Likely secondary to orthostatic hypotension in the setting of diarrhea/pyelonephritis. - supportive care  Disposition: Remain inpatient  DVT Prophylaxis: Prophylactic Lovenox   Code Status: Full code  Family Communication None at bedside  Procedures:  None  CONSULTS:  None   MEDICATIONS: Scheduled Meds: .  ceFAZolin (ANCEF) IV  2 g Intravenous 3 times per day  . enoxaparin (LOVENOX) injection  20 mg Subcutaneous Q24H  . insulin aspart  0-15 Units Subcutaneous TID WC  . insulin aspart  0-5 Units Subcutaneous QHS  . insulin detemir  20 Units Subcutaneous Daily  . saccharomyces boulardii  250 mg Oral BID   Continuous Infusions: . sodium chloride 0.9 % 1,000 mL with potassium chloride 20 mEq infusion 75 mL/hr at 07/25/14 0502   PRN Meds:.hydrALAZINE, loperamide, ondansetron (ZOFRAN) IV, ondansetron  Antibiotics: Anti-infectives   Start     Dose/Rate Route Frequency Ordered Stop   07/24/14 1030  ceFAZolin (ANCEF) IVPB 2 g/50 mL premix     2 g 100 mL/hr over 30 Minutes Intravenous 3 times per day 07/24/14 1018     07/23/14 1400  cefTRIAXone (ROCEPHIN) 1 g in dextrose 5 % 50 mL IVPB  Status:  Discontinued     1 g 100 mL/hr over 30 Minutes Intravenous Every 24 hours 07/22/14 1932 07/24/14 0943   07/22/14 1430  cefTRIAXone (ROCEPHIN) 1 g in dextrose 5 % 50 mL IVPB     1 g 100 mL/hr over 30 Minutes Intravenous  Once 07/22/14 1428 07/22/14 1626  PHYSICAL EXAM: Vital signs in last 24 hours: Filed Vitals:   07/24/14 0937 07/24/14 1513 07/24/14 2228 07/25/14 0558  BP: 101/61 138/81 108/68 136/79  Pulse:  92 98 85  Temp:  98.6 F (37 C) 98.3 F (36.8 C) 98.4 F (36.9 C)  TempSrc:  Oral Oral  Oral  Resp:  18 18 16   Height:      Weight:      SpO2:  100% 98% 98%    Weight change:  Filed Weights   07/22/14 1452 07/22/14 1700  Weight: 39.463 kg (87 lb) 39.4 kg (86 lb 13.8 oz)   Body mass index is 14.45 kg/(m^2).   Gen Exam: Awake and alert with clear speech.   Neck: Supple, No JVD.   Chest: B/L Clear.  No rales/rhonchi CVS: S1 S2 Regular, no murmurs.  Abdomen: soft, BS +, non tender, non distended.  Extremities: no edema, lower extremities warm to touch. Neurologic: Non Focal.   Skin: No Rash.   Wounds: N/A.   Intake/Output from previous day:  Intake/Output Summary (Last 24 hours) at 07/25/14 1002 Last data filed at 07/25/14 0830  Gross per 24 hour  Intake      1 ml  Output   1440 ml  Net  -1439 ml     LAB RESULTS: CBC  Recent Labs Lab 07/22/14 1236 07/24/14 0655  WBC 8.1 8.8  HGB 12.6* 10.9*  HCT 36.7* 32.6*  PLT 177 171  MCV 86.6 87.2  MCH 29.7 29.1  MCHC 34.3 33.4  RDW 13.8 14.2  LYMPHSABS 1.5  --   MONOABS 0.3  --   EOSABS 0.0  --   BASOSABS 0.0  --     Chemistries   Recent Labs Lab 07/22/14 1236 07/22/14 1934 07/24/14 0655 07/25/14 0553  NA 134* 141 138 141  K 3.9 3.6* 3.1* 3.4*  CL 86* 95* 98 101  CO2 31 29 27 28   GLUCOSE 373* 299* 209* 152*  BUN 14 14 11 9   CREATININE 0.69 0.78 0.72 0.79  CALCIUM 9.4 9.1 8.7 8.4    CBG:  Recent Labs Lab 07/24/14 1510 07/24/14 1807 07/24/14 1915 07/24/14 2219 07/25/14 0809  GLUCAP 295* 67* 77 173* 152*    GFR Estimated Creatinine Clearance: 52.7 ml/min (by C-G formula based on Cr of 0.79).  Coagulation profile No results found for this basename: INR, PROTIME,  in the last 168 hours  Cardiac Enzymes No results found for this basename: CK, CKMB, TROPONINI, MYOGLOBIN,  in the last 168 hours  No components found with this basename: POCBNP,  No results found for this basename: DDIMER,  in the last 72 hours  Recent Labs  07/22/14 1236  HGBA1C 15.7*   No results found for  this basename: CHOL, HDL, LDLCALC, TRIG, CHOLHDL, LDLDIRECT,  in the last 72 hours No results found for this basename: TSH, T4TOTAL, FREET3, T3FREE, THYROIDAB,  in the last 72 hours No results found for this basename: VITAMINB12, FOLATE, FERRITIN, TIBC, IRON, RETICCTPCT,  in the last 72 hours  Recent Labs  07/22/14 1236  LIPASE 32    Urine Studies No results found for this basename: UACOL, UAPR, USPG, UPH, UTP, UGL, UKET, UBIL, UHGB, UNIT, UROB, ULEU, UEPI, UWBC, URBC, UBAC, CAST, CRYS, UCOM, BILUA,  in the last 72 hours  MICROBIOLOGY: Recent Results (from the past 240 hour(s))  URINE CULTURE     Status: None   Collection Time    07/22/14 12:24 PM      Result Value  Ref Range Status   Specimen Description URINE, RANDOM   Final   Special Requests ADDED [phone]   Final   Culture  Setup Time     Final   Value: 07/22/2014 00:00     Performed at [facility]     Final   Value: >=100,000 COLONIES/ML     Performed at [facility]   Culture     Final   Value: STAPHYLOCOCCUS AUREUS     Note: RIFAMPIN AND GENTAMICIN SHOULD NOT BE USED AS SINGLE DRUGS FOR TREATMENT OF STAPH INFECTIONS.     Performed at [facility]   Report Status 07/24/2014 FINAL   Final   Organism ID, Bacteria STAPHYLOCOCCUS AUREUS   Final  CLOSTRIDIUM DIFFICILE BY PCR     Status: None   Collection Time    07/22/14  4:28 PM      Result Value Ref Range Status   C difficile by pcr NEGATIVE  NEGATIVE Final  CULTURE, BLOOD (ROUTINE X 2)     Status: None   Collection Time    07/23/14  9:20 AM      Result Value Ref Range Status   Specimen Description BLOOD RIGHT ANTECUBITAL   Final   Special Requests BOTTLES DRAWN AEROBIC AND ANAEROBIC 4.0CCS   Final   Culture  Setup Time     Final   Value: 07/23/2014 14:23     Performed at [facility]   Culture     Final   Value:        BLOOD CULTURE RECEIVED NO GROWTH TO DATE CULTURE WILL BE HELD FOR 5 DAYS BEFORE ISSUING A FINAL  NEGATIVE REPORT     Performed at [facility]   Report Status PENDING   Incomplete  CULTURE, BLOOD (ROUTINE X 2)     Status: None   Collection Time    07/23/14  9:30 AM      Result Value Ref Range Status   Specimen Description BLOOD RIGHT ARM   Final   Special Requests BOTTLES DRAWN AEROBIC ONLY 4CC   Final   Culture  Setup Time     Final   Value: 07/23/2014 14:22     Performed at [facility]   Culture     Final   Value:        BLOOD CULTURE RECEIVED NO GROWTH TO DATE CULTURE WILL BE HELD FOR 5 DAYS BEFORE ISSUING A FINAL NEGATIVE REPORT     Performed at [facility]   Report Status PENDING   Incomplete    RADIOLOGY STUDIES/RESULTS: Dg Chest 2 View  06/29/2014   CLINICAL DATA:  Chest pain with history of diabetes  EXAM: CHEST  2 VIEW  COMPARISON:  Portable chest X ray of May 13, 2014  FINDINGS: The lungs are mildly hyperinflated. There is no focal infiltrate. The heart and pulmonary vascularity are normal. There is no pleural effusion or pneumothorax. The mediastinum is normal in width. The bony thorax is unremarkable.  IMPRESSION: Mild chronic height per inflation is consistent with COPD. There is no acute cardiopulmonary abnormality.   Electronically Signed   By: David  Martinique   On: 06/29/2014 20:57   Ct Abdomen Pelvis W Contrast  07/23/2014   CLINICAL DATA:  Left lower quadrant pain.  EXAM: CT ABDOMEN AND PELVIS WITH CONTRAST  TECHNIQUE: Multidetector CT imaging of the abdomen and pelvis was performed using the standard protocol following bolus administration of intravenous  contrast.  CONTRAST:  31mL OMNIPAQUE IOHEXOL 300 MG/ML  SOLN  COMPARISON:  None.  FINDINGS: BODY WALL: Paucity of fat.  LOWER CHEST: Diffuse coronary atherosclerosis.  ABDOMEN/PELVIS:  Liver: No focal abnormality.  Biliary: No evidence of biliary obstruction or stone.  Pancreas: Unremarkable.  Spleen: Unremarkable.  Adrenals: Unremarkable.  Kidneys and ureters: Patchy enhancement of the bilateral  kidneys with a striated appearance favoring pyelonephritis over infarcts. No hydronephrosis. There are 2 left renal cysts which were present on chest CT from 2009, the larger in the upper pole measuring 2 cm. No debris/fluid layers. Additional tiny (~5 mm)cystic areas in the bilateral kidneys could reflect some liquefaction, but no mature or drainable abscess.  Bladder: Moderately distended.  Reproductive: Unremarkable.  Bowel: No obstruction. Negative appendix.  Retroperitoneum: No mass or adenopathy.  Peritoneum: No ascites or pneumoperitoneum.  Vascular: Extensive aortoiliac atherosclerosis.  OSSEOUS: No acute abnormalities.  IMPRESSION: Bilateral pyelonephritis. No hydronephrosis or drainable collection.   Electronically Signed   By: Jorje Guild M.D.   On: 07/23/2014 01:33    Oren Binet, MD  [facility] Pager:[phone]  If 7PM-7AM, please contact night-coverage [email or web address] Password [email or web address] 07/25/2014, 10:02 AM   LOS: 3 days   **Disclaimer: This note may have been dictated with voice recognition software. Similar sounding words can inadvertently be transcribed and this note may contain transcription errors which may not have been corrected upon publication of note.**

## 2014-07-26 DIAGNOSIS — R7881 Bacteremia: Secondary | ICD-10-CM

## 2014-07-26 DIAGNOSIS — A4901 Methicillin susceptible Staphylococcus aureus infection, unspecified site: Secondary | ICD-10-CM

## 2014-07-26 DIAGNOSIS — N289 Disorder of kidney and ureter, unspecified: Secondary | ICD-10-CM

## 2014-07-26 DIAGNOSIS — I369 Nonrheumatic tricuspid valve disorder, unspecified: Secondary | ICD-10-CM

## 2014-07-26 DIAGNOSIS — N12 Tubulo-interstitial nephritis, not specified as acute or chronic: Secondary | ICD-10-CM

## 2014-07-26 DIAGNOSIS — E43 Unspecified severe protein-calorie malnutrition: Secondary | ICD-10-CM | POA: Diagnosis present

## 2014-07-26 LAB — GI PATHOGEN PANEL BY PCR, STOOL
C DIFFICILE TOXIN A/B: NEGATIVE
Campylobacter by PCR: NEGATIVE
Cryptosporidium by PCR: NEGATIVE
E COLI (ETEC) LT/ST: NEGATIVE
E COLI (STEC): NEGATIVE
E COLI 0157 BY PCR: NEGATIVE
G lamblia by PCR: NEGATIVE
Norovirus GI/GII: NEGATIVE
Rotavirus A by PCR: NEGATIVE
SALMONELLA BY PCR: NEGATIVE
SHIGELLA BY PCR: NEGATIVE

## 2014-07-26 LAB — GLUCOSE, CAPILLARY
GLUCOSE-CAPILLARY: 216 mg/dL — AB (ref 70–99)
GLUCOSE-CAPILLARY: 408 mg/dL — AB (ref 70–99)
Glucose-Capillary: 175 mg/dL — ABNORMAL HIGH (ref 70–99)
Glucose-Capillary: 141 mg/dL — ABNORMAL HIGH (ref 70–99)

## 2014-07-26 LAB — BASIC METABOLIC PANEL
Anion gap: 9 (ref 5–15)
BUN: 11 mg/dL (ref 6–23)
CHLORIDE: 103 meq/L (ref 96–112)
CO2: 27 meq/L (ref 19–32)
Calcium: 8.6 mg/dL (ref 8.4–10.5)
Creatinine, Ser: 0.75 mg/dL (ref 0.50–1.35)
GFR calc Af Amer: >90 mL/min (ref >90)
GFR calc non Af Amer: >90 mL/min (ref >90)
GLUCOSE: 190 mg/dL — AB (ref 70–99)
POTASSIUM: 4.2 meq/L (ref 3.7–5.3)
SODIUM: 139 meq/L (ref 137–147)

## 2014-07-26 MED ORDER — INSULIN DETEMIR 100 UNIT/ML ~~LOC~~ SOLN
25.0000 [IU] | Freq: Every day | SUBCUTANEOUS | Status: DC
  Administered 2014-07-27 – 2014-07-28 (×2): 25 [IU] via SUBCUTANEOUS
  Filled 2014-07-26 (×2): qty 0.25

## 2014-07-26 MED ORDER — CEFAZOLIN SODIUM 1-5 GM-% IV SOLN
1.0000 g | Freq: Three times a day (TID) | INTRAVENOUS | Status: DC
  Administered 2014-07-26 – 2014-07-28 (×8): 1 g via INTRAVENOUS
  Filled 2014-07-26 (×9): qty 50

## 2014-07-26 MED ORDER — INSULIN ASPART 100 UNIT/ML ~~LOC~~ SOLN
20.0000 [IU] | Freq: Once | SUBCUTANEOUS | Status: AC
  Administered 2014-07-26: 20 [IU] via SUBCUTANEOUS

## 2014-07-26 MED ORDER — GLUCERNA SHAKE PO LIQD
237.0000 mL | Freq: Three times a day (TID) | ORAL | Status: DC
  Administered 2014-07-26 – 2014-07-28 (×7): 237 mL via ORAL

## 2014-07-26 MED ORDER — INSULIN ASPART 100 UNIT/ML ~~LOC~~ SOLN
4.0000 [IU] | Freq: Three times a day (TID) | SUBCUTANEOUS | Status: DC
  Administered 2014-07-26 – 2014-07-28 (×8): 4 [IU] via SUBCUTANEOUS

## 2014-07-26 MED ORDER — LIVING WELL WITH DIABETES BOOK
Freq: Once | Status: AC
  Administered 2014-07-26: 1
  Filled 2014-07-26: qty 1

## 2014-07-26 MED ORDER — INSULIN STARTER KIT- SYRINGES (ENGLISH)
1.0000 | Freq: Once | Status: AC
  Administered 2014-07-26: 1
  Filled 2014-07-26: qty 1

## 2014-07-26 NOTE — Plan of Care (Signed)
Problem: Food- and Nutrition-Related Knowledge Deficit (NB-1.1) Goal: Nutrition education Formal process to instruct or train a patient/client in a skill or to impart knowledge to help patients/clients voluntarily manage or modify food choices and eating behavior to maintain or improve health. Outcome: Completed/Met Date Met:  07/26/14  RD consulted for nutrition education regarding diabetes.     Lab Results  Component Value Date    HGBA1C 15.7* 07/22/2014    RD provided "Carbohydrate Counting for People with Diabetes" handout from the Academy of Nutrition and Dietetics. Discussed different food groups and their effects on blood sugar, emphasizing carbohydrate-containing foods. Provided list of carbohydrates and recommended serving sizes of common foods.  Discussed importance of controlled and consistent carbohydrate intake throughout the day. Provided examples of ways to balance meals/snacks and encouraged intake of high-fiber, whole grain complex carbohydrates. Teach back method used.  Expect good compliance.  Body mass index is 14.45 kg/(m^2). Pt meets criteria for underweight based on current BMI.  Current diet order is carbohydrate modified, patient is consuming approximately 100% of meals at this time. Labs and medications reviewed. No further nutrition interventions warranted at this time. RD contact information provided. If additional nutrition issues arise, please re-consult RD.   Hawkins RD, LDN        

## 2014-07-26 NOTE — Progress Notes (Signed)
  Echocardiogram 2D Echocardiogram has been performed.  Brian Owen 07/26/2014, 9:47 AM

## 2014-07-26 NOTE — Progress Notes (Signed)
Inpatient Diabetes Program Recommendations  AACE/ADA: New Consensus Statement on Inpatient Glycemic Control (2013)  Target Ranges:  Prepandial:   less than 140 mg/dL      Peak postprandial:   less than 180 mg/dL (1-2 hours)      Critically ill patients:  140 - 180 mg/dL   Hyperglycemia into 300's and 400's  Inpatient Diabetes Program Recommendations Correction (SSI): Pllease consider increase in correction to resistant tidwc and the HS correction scale  Thank you, Rosita Kea, RN, CNS, Diabetes Coordinator [phone])

## 2014-07-26 NOTE — Progress Notes (Signed)
INITIAL NUTRITION ASSESSMENT  DOCUMENTATION CODES Per approved criteria  -Severe malnutrition in the context of chronic illness  Pt meets criteria for severe MALNUTRITION in the context of chronic illness as evidenced by severe fat and muscle wasting and 14% weight loss in <3 months and severe fat and muscle wasting.  INTERVENTION: - Glucerna Shake po TID, each supplement provides 220 kcal and 10 grams of protein - RD will continue to monitor for nutrition care plan.  NUTRITION DIAGNOSIS: Altered nutrition-related lab values related to diabetes mellitus as evidenced by A1C of 15.7%.   Goal: Pt to meet >/= 90% of their estimated nutrition needs   Monitor:  Weight trend, po intake, acceptance of supplements, labs, level of knowledge  Reason for Assessment: Consult for nutrition education  63 y.o. male  Admitting Dx: <principal problem not specified>  ASSESSMENT: 63 y.o. male who speaks [place], has a h/o of hypertension( not on any medications), DM( takes metformin) , very noncompliant to meds and office visits, comes in for worsening abdominal pain, nausea, dizziness since last night.   - History obtained using interpretor.  - Per chart history, pt has lost 14 lbs in the past 3 months. He says that he eats small amounts at meals and his children help him watch his carbohydrates.  - Pt was provided with diet education for Diabetes Mellitus. Very interested and had many questions.   Nutrition Focused Physical Exam:  Subcutaneous Fat:  Orbital Region: mild wasting Upper Arm Region: moderate wasting Thoracic and Lumbar Region: moderate wasting  Muscle:  Temple Region: moderate wasting Clavicle Bone Region: moderate to severe wasting Clavicle and Acromion Bone Region: moderate to severe wasting Scapular Bone Region: moderate wasting Dorsal Hand: moderate wasting Patellar Region: moderate wasting Anterior Thigh Region: mild wasting Posterior Calf Region: mild  wasting  Edema: none  Height: Ht Readings from Last 1 Encounters:  07/22/14 5\' 5"  (1.651 m)    Weight: Wt Readings from Last 1 Encounters:  07/22/14 86 lb 13.8 oz (39.4 kg)    Ideal Body Weight: 61.5 kg  % Ideal Body Weight: 64%  Wt Readings from Last 10 Encounters:  07/22/14 86 lb 13.8 oz (39.4 kg)  06/23/14 99 lb (44.906 kg)  05/17/14 100 lb (45.36 kg)  05/15/14 100 lb 1.4 oz (45.4 kg)  05/15/14 100 lb 1.4 oz (45.4 kg)    Usual Body Weight: 100 lbs  % Usual Body Weight: 86%  BMI:  Body mass index is 14.45 kg/(m^2).  Estimated Nutritional Needs: Kcal: 1150-1350 Protein: 55-70 g Fluid: >1.3 L/day  Skin: Intact  Diet Order: Carb Control  EDUCATION NEEDS: -Education needs addressed   Intake/Output Summary (Last 24 hours) at 07/26/14 1504 Last data filed at 07/26/14 1300  Gross per 24 hour  Intake    692 ml  Output   1425 ml  Net   -733 ml    Last BM: 9/27   Labs:   Recent Labs Lab 07/24/14 0655 07/25/14 0553 07/26/14 0750  NA 138 141 139  K 3.1* 3.4* 4.2  CL 98 101 103  CO2 27 28 27   BUN 11 9 11   CREATININE 0.72 0.79 0.75  CALCIUM 8.7 8.4 8.6  GLUCOSE 209* 152* 190*    CBG (last 3)   Recent Labs  07/25/14 2101 07/26/14 0748 07/26/14 1150  GLUCAP 359* 216* 408*    Scheduled Meds: .  ceFAZolin (ANCEF) IV  1 g Intravenous 3 times per day  . enoxaparin (LOVENOX) injection  20 mg Subcutaneous  Q24H  . insulin aspart  0-15 Units Subcutaneous TID WC  . insulin aspart  0-5 Units Subcutaneous QHS  . insulin aspart  4 Units Subcutaneous TID WC  . [START ON 07/27/2014] insulin detemir  25 Units Subcutaneous Daily  . saccharomyces boulardii  250 mg Oral BID    Continuous Infusions: . sodium chloride 0.9 % 1,000 mL with potassium chloride 20 mEq infusion 75 mL/hr at 07/26/14 [id number]    Past Medical History  Diagnosis Date  . Diabetes mellitus without complication     Past Surgical History  Procedure Laterality Date  . No past  surgeries      Terrace Arabia RD, LDN

## 2014-07-26 NOTE — Progress Notes (Signed)
PATIENT DETAILS Name: Brian Owen Age: 63 y.o. Sex: male Date of Birth: May 08, 1951 Admit Date: 07/22/2014 Admitting Physician Hosie Poisson, MD PCP:No PCP Per Patient  Subjective: Diarrhea seems to have slowed down  Assessment/Plan: Active Problems: Systemic inflammatory response syndrome - Secondary to pyelonephritis.. - Treated with IV fluids, IV antibiotics for now, await further culture data and stool studies. Recent (July 2015) history of MSSA bacteremia, patient did not complete treatment and signed out [place], blood culture repeated on 9/25-while on Rocephin-negative so far-but given untreated MSSA bacteremia-now pyelonephritis-concerned that patient may have seeded kidneys (CT Abd shows has tiny cystic areas in b/l kidneys). Appreciate ID evaluation. Await official report of TTE-?need for TEE  Bilateral pyelonephritis - Likely causing above. - Currently afebrile, nontoxic looking. - Urine culture-MSSA, given recent hx  (July 2015) of MSSA Bacteremia not treated appropriately (signed out AMA)- stopped Rocephin and started Ancef. Given CT Abd shows has tiny cystic areas in b/l kidneys,concerned that patient may have seeded kidneys For now, Day 5 of antimicrobial therapy-Day 3 of Ancef. Will d/w ID regarding further plan  Diarrhea -? Etiology-suspect infectious diarrhea. C. difficile PCR negative. Improving with prn Imodium, and probiotics. Follow-await further stool culture results and GI pathogen panel.    Hyponatremia - Secondary to dehydration, resolved with IV fluids. Monitor electrolytes periodically.  Hypokalemia -secondary to GI loss-replete and recheck in am  Recent MSSA bacteremia/Leg abscess - Did not complete treatment and signed out against medical advise. No evidence any residual thigh/leg abscess on exam.Await repeat blood culture-given Staph Aureus in Urine cs-started Ancef on 9/26. See above for further details    Diabetes mellitus type 2,  uncontrolled - Unfortunately has refused to be on insulin in the past, A1c at 15.7. - Increased Levemir to 25 units, and 4 units of NovoLog with meals, continue with SSI. Had discussion with patient and daughter yesterday, seems to be agreeable to be discharged on Insulin. Begin insulin teaching. Per family, there is a nephew who has diabetes and is on insulin, he lives with the patient and can administer insulin at home as well.  Orthostatic Hypotension -secondary to diarrhea -stop Lisinopril, c/w IVF, treat diarrhea  Dizziness - Likely secondary to orthostatic hypotension in the setting of diarrhea/pyelonephritis. - supportive care  Disposition: Remain inpatient  DVT Prophylaxis: Prophylactic Lovenox   Code Status: Full code  Family Communication None at bedside  Procedures:  None  CONSULTS:  None   MEDICATIONS: Scheduled Meds: .  ceFAZolin (ANCEF) IV  2 g Intravenous 3 times per day  . enoxaparin (LOVENOX) injection  20 mg Subcutaneous Q24H  . insulin aspart  0-15 Units Subcutaneous TID WC  . insulin aspart  0-5 Units Subcutaneous QHS  . insulin detemir  20 Units Subcutaneous Daily  . saccharomyces boulardii  250 mg Oral BID   Continuous Infusions: . sodium chloride 0.9 % 1,000 mL with potassium chloride 20 mEq infusion 75 mL/hr at 07/26/14 0717   PRN Meds:.acetaminophen, hydrALAZINE, loperamide, ondansetron (ZOFRAN) IV, ondansetron  Antibiotics: Anti-infectives   Start     Dose/Rate Route Frequency Ordered Stop   07/24/14 1030  ceFAZolin (ANCEF) IVPB 2 g/50 mL premix     2 g 100 mL/hr over 30 Minutes Intravenous 3 times per day 07/24/14 1018     07/23/14 1400  cefTRIAXone (ROCEPHIN) 1 g in dextrose 5 % 50 mL IVPB  Status:  Discontinued     1 g 100 mL/hr over 30  Minutes Intravenous Every 24 hours 07/22/14 1932 07/24/14 0943   07/22/14 1430  cefTRIAXone (ROCEPHIN) 1 g in dextrose 5 % 50 mL IVPB     1 g 100 mL/hr over 30 Minutes Intravenous  Once 07/22/14  1428 07/22/14 1626       PHYSICAL EXAM: Vital signs in last 24 hours: Filed Vitals:   07/25/14 0558 07/25/14 1530 07/25/14 2244 07/26/14 0528  BP: 136/79 147/78 99/65 117/66  Pulse: 85 83 85 79  Temp: 98.4 F (36.9 C) 97.9 F (36.6 C) 99.4 F (37.4 C) 99.5 F (37.5 C)  TempSrc: Oral Oral Oral Oral  Resp: 16 18 16 18   Height:      Weight:      SpO2: 98% 98% 98% 100%    Weight change:  Filed Weights   07/22/14 1452 07/22/14 1700  Weight: 39.463 kg (87 lb) 39.4 kg (86 lb 13.8 oz)   Body mass index is 14.45 kg/(m^2).   Gen Exam: Awake and alert with clear speech.   Neck: Supple, No JVD.   Chest: B/L Clear.  No rales/rhonchi CVS: S1 S2 Regular, no murmurs.  Abdomen: soft, BS +, non tender, non distended.  Extremities: no edema, lower extremities warm to touch. Neurologic: Non Focal.   Skin: No Rash.   Wounds: N/A.   Intake/Output from previous day:  Intake/Output Summary (Last 24 hours) at 07/26/14 1018 Last data filed at 07/26/14 0758  Gross per 24 hour  Intake  712.5 ml  Output   1425 ml  Net -712.5 ml     LAB RESULTS: CBC  Recent Labs Lab 07/22/14 1236 07/24/14 0655  WBC 8.1 8.8  HGB 12.6* 10.9*  HCT 36.7* 32.6*  PLT 177 171  MCV 86.6 87.2  MCH 29.7 29.1  MCHC 34.3 33.4  RDW 13.8 14.2  LYMPHSABS 1.5  --   MONOABS 0.3  --   EOSABS 0.0  --   BASOSABS 0.0  --     Chemistries   Recent Labs Lab 07/22/14 1236 07/22/14 1934 07/24/14 0655 07/25/14 0553 07/26/14 0750  NA 134* 141 138 141 139  K 3.9 3.6* 3.1* 3.4* 4.2  CL 86* 95* 98 101 103  CO2 31 29 27 28 27   GLUCOSE 373* 299* 209* 152* 190*  BUN 14 14 11 9 11   CREATININE 0.69 0.78 0.72 0.79 0.75  CALCIUM 9.4 9.1 8.7 8.4 8.6    CBG:  Recent Labs Lab 07/25/14 0809 07/25/14 1212 07/25/14 1718 07/25/14 2101 07/26/14 0748  GLUCAP 152* 283* 197* 359* 216*    GFR Estimated Creatinine Clearance: 52.7 ml/min (by C-G formula based on Cr of 0.75).  Coagulation profile No results  found for this basename: INR, PROTIME,  in the last 168 hours  Cardiac Enzymes No results found for this basename: CK, CKMB, TROPONINI, MYOGLOBIN,  in the last 168 hours  No components found with this basename: POCBNP,  No results found for this basename: DDIMER,  in the last 72 hours No results found for this basename: HGBA1C,  in the last 72 hours No results found for this basename: CHOL, HDL, LDLCALC, TRIG, CHOLHDL, LDLDIRECT,  in the last 72 hours No results found for this basename: TSH, T4TOTAL, FREET3, T3FREE, THYROIDAB,  in the last 72 hours No results found for this basename: VITAMINB12, FOLATE, FERRITIN, TIBC, IRON, RETICCTPCT,  in the last 72 hours No results found for this basename: LIPASE, AMYLASE,  in the last 72 hours  Urine Studies No results found for this  basename: UACOL, UAPR, USPG, UPH, UTP, UGL, UKET, UBIL, UHGB, UNIT, UROB, ULEU, UEPI, UWBC, URBC, UBAC, CAST, CRYS, UCOM, BILUA,  in the last 72 hours  MICROBIOLOGY: Recent Results (from the past 240 hour(s))  URINE CULTURE     Status: None   Collection Time    07/22/14 12:24 PM      Result Value Ref Range Status   Specimen Description URINE, RANDOM   Final   Special Requests ADDED [id number]   Final   Culture  Setup Time     Final   Value: 07/22/2014 00:00     Performed at [place]     Final   Value: >=100,000 COLONIES/ML     Performed at [facility]   Culture     Final   Value: STAPHYLOCOCCUS AUREUS     Note: RIFAMPIN AND GENTAMICIN SHOULD NOT BE USED AS SINGLE DRUGS FOR TREATMENT OF STAPH INFECTIONS.     Performed at [facility]   Report Status 07/24/2014 FINAL   Final   Organism ID, Bacteria STAPHYLOCOCCUS AUREUS   Final  CLOSTRIDIUM DIFFICILE BY PCR     Status: None   Collection Time    07/22/14  4:28 PM      Result Value Ref Range Status   C difficile by pcr NEGATIVE  NEGATIVE Final  CULTURE, BLOOD (ROUTINE X 2)     Status: None   Collection Time     07/23/14  9:20 AM      Result Value Ref Range Status   Specimen Description BLOOD RIGHT ANTECUBITAL   Final   Special Requests BOTTLES DRAWN AEROBIC AND ANAEROBIC 4.0CCS   Final   Culture  Setup Time     Final   Value: 07/23/2014 14:23     Performed at [facility]   Culture     Final   Value:        BLOOD CULTURE RECEIVED NO GROWTH TO DATE CULTURE WILL BE HELD FOR 5 DAYS BEFORE ISSUING A FINAL NEGATIVE REPORT     Performed at [facility]   Report Status PENDING   Incomplete  CULTURE, BLOOD (ROUTINE X 2)     Status: None   Collection Time    07/23/14  9:30 AM      Result Value Ref Range Status   Specimen Description BLOOD RIGHT ARM   Final   Special Requests BOTTLES DRAWN AEROBIC ONLY 4CC   Final   Culture  Setup Time     Final   Value: 07/23/2014 14:22     Performed at [facility]   Culture     Final   Value:        BLOOD CULTURE RECEIVED NO GROWTH TO DATE CULTURE WILL BE HELD FOR 5 DAYS BEFORE ISSUING A FINAL NEGATIVE REPORT     Performed at [facility]   Report Status PENDING   Incomplete  STOOL CULTURE     Status: None   Collection Time    07/23/14  4:08 PM      Result Value Ref Range Status   Specimen Description STOOL   Final   Special Requests NONE   Final   Culture     Final   Value: MODERATE YEAST NO SUSPICIOUS COLONIES, CONTINUING TO HOLD     Note: REDUCED NORMAL FLORA PRESENT     Performed at [facility]   Report Status PENDING   Incomplete  RADIOLOGY STUDIES/RESULTS: Dg Chest 2 View  06/29/2014   CLINICAL DATA:  Chest pain with history of diabetes  EXAM: CHEST  2 VIEW  COMPARISON:  Portable chest X ray of May 13, 2014  FINDINGS: The lungs are mildly hyperinflated. There is no focal infiltrate. The heart and pulmonary vascularity are normal. There is no pleural effusion or pneumothorax. The mediastinum is normal in width. The bony thorax is unremarkable.  IMPRESSION: Mild chronic height per inflation is consistent with  COPD. There is no acute cardiopulmonary abnormality.   Electronically Signed   By: David  Martinique   On: 06/29/2014 20:57   Ct Abdomen Pelvis W Contrast  07/23/2014   CLINICAL DATA:  Left lower quadrant pain.  EXAM: CT ABDOMEN AND PELVIS WITH CONTRAST  TECHNIQUE: Multidetector CT imaging of the abdomen and pelvis was performed using the standard protocol following bolus administration of intravenous contrast.  CONTRAST:  12mL OMNIPAQUE IOHEXOL 300 MG/ML  SOLN  COMPARISON:  None.  FINDINGS: BODY WALL: Paucity of fat.  LOWER CHEST: Diffuse coronary atherosclerosis.  ABDOMEN/PELVIS:  Liver: No focal abnormality.  Biliary: No evidence of biliary obstruction or stone.  Pancreas: Unremarkable.  Spleen: Unremarkable.  Adrenals: Unremarkable.  Kidneys and ureters: Patchy enhancement of the bilateral kidneys with a striated appearance favoring pyelonephritis over infarcts. No hydronephrosis. There are 2 left renal cysts which were present on chest CT from 2009, the larger in the upper pole measuring 2 cm. No debris/fluid layers. Additional tiny (~5 mm)cystic areas in the bilateral kidneys could reflect some liquefaction, but no mature or drainable abscess.  Bladder: Moderately distended.  Reproductive: Unremarkable.  Bowel: No obstruction. Negative appendix.  Retroperitoneum: No mass or adenopathy.  Peritoneum: No ascites or pneumoperitoneum.  Vascular: Extensive aortoiliac atherosclerosis.  OSSEOUS: No acute abnormalities.  IMPRESSION: Bilateral pyelonephritis. No hydronephrosis or drainable collection.   Electronically Signed   By: Jorje Guild M.D.   On: 07/23/2014 01:33    Oren Binet, MD  [facility] Pager:[phone]  If 7PM-7AM, please contact night-coverage [email or web address] Password [email or web address] 07/26/2014, 10:18 AM   LOS: 4 days   **Disclaimer: This note may have been dictated with voice recognition software. Similar sounding words can inadvertently be transcribed and this note may contain  transcription errors which may not have been corrected upon publication of note.**

## 2014-07-26 NOTE — Progress Notes (Addendum)
INFECTIOUS DISEASE PROGRESS NOTE  ID: Brian Owen is a 63 y.o. male with  Active Problems:   Hyponatremia   Diabetes mellitus type 2, uncontrolled   UTI (lower urinary tract infection)   Dizziness   Abdominal pain, left lower quadrant   Protein-calorie malnutrition, severe  Subjective: No complaints  Abtx:  Anti-infectives   Start     Dose/Rate Route Frequency Ordered Stop   07/26/14 1400  ceFAZolin (ANCEF) IVPB 1 g/50 mL premix     1 g 100 mL/hr over 30 Minutes Intravenous 3 times per day 07/26/14 1248     07/24/14 1030  ceFAZolin (ANCEF) IVPB 2 g/50 mL premix  Status:  Discontinued     2 g 100 mL/hr over 30 Minutes Intravenous 3 times per day 07/24/14 1018 07/26/14 1248   07/23/14 1400  cefTRIAXone (ROCEPHIN) 1 g in dextrose 5 % 50 mL IVPB  Status:  Discontinued     1 g 100 mL/hr over 30 Minutes Intravenous Every 24 hours 07/22/14 1932 07/24/14 0943   07/22/14 1430  cefTRIAXone (ROCEPHIN) 1 g in dextrose 5 % 50 mL IVPB     1 g 100 mL/hr over 30 Minutes Intravenous  Once 07/22/14 1428 07/22/14 1626      Medications:  Scheduled: .  ceFAZolin (ANCEF) IV  1 g Intravenous 3 times per day  . enoxaparin (LOVENOX) injection  20 mg Subcutaneous Q24H  . feeding supplement (GLUCERNA SHAKE)  237 mL Oral TID BM  . insulin aspart  0-15 Units Subcutaneous TID WC  . insulin aspart  0-5 Units Subcutaneous QHS  . insulin aspart  4 Units Subcutaneous TID WC  . [START ON 07/27/2014] insulin detemir  25 Units Subcutaneous Daily  . saccharomyces boulardii  250 mg Oral BID    Objective: Vital signs in last 24 hours: Temp:  [98.9 F (37.2 C)-99.5 F (37.5 C)] 98.9 F (37.2 C) (09/28 1408) Pulse Rate:  [79-105] 105 (09/28 1408) Resp:  [16-20] 20 (09/28 1408) BP: (99-156)/(65-89) 156/89 mmHg (09/28 1408) SpO2:  [98 %-100 %] 99 % (09/28 1408)   General appearance: alert, cooperative and no distress Resp: clear to auscultation bilaterally Cardio: regular rate and rhythm GI: normal  findings: bowel sounds normal and soft, non-tender Extremities: thighs non-tender  Lab Results  Recent Labs  07/24/14 0655 07/25/14 0553 07/26/14 0750  WBC 8.8  --   --   HGB 10.9*  --   --   HCT 32.6*  --   --   NA 138 141 139  K 3.1* 3.4* 4.2  CL 98 101 103  CO2 27 28 27   BUN 11 9 11   CREATININE 0.72 0.79 0.75   Liver Panel No results found for this basename: PROT, ALBUMIN, AST, ALT, ALKPHOS, BILITOT, BILIDIR, IBILI,  in the last 72 hours Sedimentation Rate No results found for this basename: ESRSEDRATE,  in the last 72 hours C-Reactive Protein No results found for this basename: CRP,  in the last 72 hours  Microbiology: Recent Results (from the past 240 hour(s))  URINE CULTURE     Status: None   Collection Time    07/22/14 12:24 PM      Result Value Ref Range Status   Specimen Description URINE, RANDOM   Final   Special Requests ADDED [id number]   Final   Culture  Setup Time     Final   Value: 07/22/2014 00:00     Performed at [place]  Final   Value: >=100,000 COLONIES/ML     Performed at [facility]   Culture     Final   Value: STAPHYLOCOCCUS AUREUS     Note: RIFAMPIN AND GENTAMICIN SHOULD NOT BE USED AS SINGLE DRUGS FOR TREATMENT OF STAPH INFECTIONS.     Performed at [facility]   Report Status 07/24/2014 FINAL   Final   Organism ID, Bacteria STAPHYLOCOCCUS AUREUS   Final  CLOSTRIDIUM DIFFICILE BY PCR     Status: None   Collection Time    07/22/14  4:28 PM      Result Value Ref Range Status   C difficile by pcr NEGATIVE  NEGATIVE Final  CULTURE, BLOOD (ROUTINE X 2)     Status: None   Collection Time    07/23/14  9:20 AM      Result Value Ref Range Status   Specimen Description BLOOD RIGHT ANTECUBITAL   Final   Special Requests BOTTLES DRAWN AEROBIC AND ANAEROBIC 4.0CCS   Final   Culture  Setup Time     Final   Value: 07/23/2014 14:23     Performed at [facility]   Culture     Final   Value:         BLOOD CULTURE RECEIVED NO GROWTH TO DATE CULTURE WILL BE HELD FOR 5 DAYS BEFORE ISSUING A FINAL NEGATIVE REPORT     Performed at [facility]   Report Status PENDING   Incomplete  CULTURE, BLOOD (ROUTINE X 2)     Status: None   Collection Time    07/23/14  9:30 AM      Result Value Ref Range Status   Specimen Description BLOOD RIGHT ARM   Final   Special Requests BOTTLES DRAWN AEROBIC ONLY 4CC   Final   Culture  Setup Time     Final   Value: 07/23/2014 14:22     Performed at [facility]   Culture     Final   Value:        BLOOD CULTURE RECEIVED NO GROWTH TO DATE CULTURE WILL BE HELD FOR 5 DAYS BEFORE ISSUING A FINAL NEGATIVE REPORT     Performed at [facility]   Report Status PENDING   Incomplete  STOOL CULTURE     Status: None   Collection Time    07/23/14  4:08 PM      Result Value Ref Range Status   Specimen Description STOOL   Final   Special Requests NONE   Final   Culture     Final   Value: MODERATE YEAST NO SUSPICIOUS COLONIES, CONTINUING TO HOLD     Note: REDUCED NORMAL FLORA PRESENT     Performed at [facility]   Report Status PENDING   Incomplete    Studies/Results: No results found.   Assessment/Plan: MSSA pyelonephritis, cystic renal lesions Previous MSSA bacteremia (july 2015) Uncontrolled DM2  Total days of antibiotics: 4 cefazolin  Await BCx done on anbx Consider TEE to r/o IE.  Would plan for long course of anbx (4-6 weeks)  c diff (-) 4 day ago. Can isolation be stopped?         [name] Infectious Diseases (pager) [phone] [email or web address] 07/26/2014, 7:41 PM  LOS: 4 days

## 2014-07-26 NOTE — Progress Notes (Signed)
ANTIBIOTIC CONSULT NOTE - FOLLOW UP  Pharmacy Consult for cefazolin Indication: MSSA UTI/pyelo, possible disseminated infection  No Known Allergies  Patient Measurements: Height: 5\' 5"  (165.1 cm) Weight: 86 lb 13.8 oz (39.4 kg) IBW/kg (Calculated) : 61.5  Vital Signs: Temp: 99.5 F (37.5 C) (09/28 0528) Temp src: Oral (09/28 0528) BP: 117/66 mmHg (09/28 0528) Pulse Rate: 79 (09/28 0528) Intake/Output from previous day: 09/27 0701 - 09/28 0700 In: 712.5 [I.V.:662.5; IV Piggyback:50] Out: [id number] Intake/Output from this shift: Total I/O In: -  Out: 500 [Urine:500]  Labs:  Recent Labs  07/24/14 0655 07/25/14 0553 07/26/14 0750  WBC 8.8  --   --   HGB 10.9*  --   --   PLT 171  --   --   CREATININE 0.72 0.79 0.75   Estimated Creatinine Clearance: 52.7 ml/min (by C-G formula based on Cr of 0.75). No results found for this basename: Letta Median, VANCORANDOM, GENTTROUGH, GENTPEAK, GENTRANDOM, TOBRATROUGH, TOBRAPEAK, TOBRARND, AMIKACINPEAK, AMIKACINTROU, AMIKACIN,  in the last 72 hours   Microbiology: Recent Results (from the past 720 hour(s))  URINE CULTURE     Status: None   Collection Time    07/22/14 12:24 PM      Result Value Ref Range Status   Specimen Description URINE, RANDOM   Final   Special Requests ADDED [id number]   Final   Culture  Setup Time     Final   Value: 07/22/2014 00:00     Performed at [place]     Final   Value: >=100,000 COLONIES/ML     Performed at [facility]   Culture     Final   Value: STAPHYLOCOCCUS AUREUS     Note: RIFAMPIN AND GENTAMICIN SHOULD NOT BE USED AS SINGLE DRUGS FOR TREATMENT OF STAPH INFECTIONS.     Performed at [facility]   Report Status 07/24/2014 FINAL   Final   Organism ID, Bacteria STAPHYLOCOCCUS AUREUS   Final  CLOSTRIDIUM DIFFICILE BY PCR     Status: None   Collection Time    07/22/14  4:28 PM      Result Value Ref Range Status   C difficile by  pcr NEGATIVE  NEGATIVE Final  CULTURE, BLOOD (ROUTINE X 2)     Status: None   Collection Time    07/23/14  9:20 AM      Result Value Ref Range Status   Specimen Description BLOOD RIGHT ANTECUBITAL   Final   Special Requests BOTTLES DRAWN AEROBIC AND ANAEROBIC 4.0CCS   Final   Culture  Setup Time     Final   Value: 07/23/2014 14:23     Performed at [facility]   Culture     Final   Value:        BLOOD CULTURE RECEIVED NO GROWTH TO DATE CULTURE WILL BE HELD FOR 5 DAYS BEFORE ISSUING A FINAL NEGATIVE REPORT     Performed at [facility]   Report Status PENDING   Incomplete  CULTURE, BLOOD (ROUTINE X 2)     Status: None   Collection Time    07/23/14  9:30 AM      Result Value Ref Range Status   Specimen Description BLOOD RIGHT ARM   Final   Special Requests BOTTLES DRAWN AEROBIC ONLY 4CC   Final   Culture  Setup Time     Final   Value: 07/23/2014 14:22     Performed at  [facility]     Final   Value:        BLOOD CULTURE RECEIVED NO GROWTH TO DATE CULTURE WILL BE HELD FOR 5 DAYS BEFORE ISSUING A FINAL NEGATIVE REPORT     Performed at [facility]   Report Status PENDING   Incomplete  STOOL CULTURE     Status: None   Collection Time    07/23/14  4:08 PM      Result Value Ref Range Status   Specimen Description STOOL   Final   Special Requests NONE   Final   Culture     Final   Value: MODERATE YEAST NO SUSPICIOUS COLONIES, CONTINUING TO HOLD     Note: REDUCED NORMAL FLORA PRESENT     Performed at [facility]   Report Status PENDING   Incomplete    Anti-infectives   Start     Dose/Rate Route Frequency Ordered Stop   07/26/14 1400  ceFAZolin (ANCEF) IVPB 1 g/50 mL premix     1 g 100 mL/hr over 30 Minutes Intravenous 3 times per day 07/26/14 1248     07/24/14 1030  ceFAZolin (ANCEF) IVPB 2 g/50 mL premix  Status:  Discontinued     2 g 100 mL/hr over 30 Minutes Intravenous 3 times per day 07/24/14 1018 07/26/14 1248   07/23/14  1400  cefTRIAXone (ROCEPHIN) 1 g in dextrose 5 % 50 mL IVPB  Status:  Discontinued     1 g 100 mL/hr over 30 Minutes Intravenous Every 24 hours 07/22/14 1932 07/24/14 0943   07/22/14 1430  cefTRIAXone (ROCEPHIN) 1 g in dextrose 5 % 50 mL IVPB     1 g 100 mL/hr over 30 Minutes Intravenous  Once 07/22/14 1428 07/22/14 1626      Assessment: 63 yo M presents to the ED on 9/24 with worsening abdominal pain, nausea, and dizziness. Of note, patient was hospitalized with MSSA bacteremia in July, signed out AMA - never completed treatment. He is on day 5 of antibiotics and day 3 of cefazolin for MSSA UTI/pyelo. ID following. Tmax 99.5, WBC are normal, renal function stable.  Goal of Therapy:  Eradication of infection  Plan:  - Decrease cefazolin to 1 g IV q8h for low weight (39 kg) - Monitor renal function and clinical course  [facility], Pharm.D., BCPS Clinical Pharmacist Pager: [phone] 07/26/2014 1:01 PM

## 2014-07-27 ENCOUNTER — Other Ambulatory Visit: Payer: Self-pay | Admitting: Physician Assistant

## 2014-07-27 LAB — GLUCOSE, CAPILLARY
GLUCOSE-CAPILLARY: 188 mg/dL — AB (ref 70–99)
Glucose-Capillary: 343 mg/dL — ABNORMAL HIGH (ref 70–99)
Glucose-Capillary: 295 mg/dL — ABNORMAL HIGH (ref 70–99)
Glucose-Capillary: 313 mg/dL — ABNORMAL HIGH (ref 70–99)
Glucose-Capillary: 140 mg/dL — ABNORMAL HIGH (ref 70–99)

## 2014-07-27 LAB — STOOL CULTURE

## 2014-07-27 MED ORDER — SODIUM CHLORIDE 0.9 % IJ SOLN
10.0000 mL | INTRAMUSCULAR | Status: DC | PRN
  Administered 2014-07-28: 10 mL

## 2014-07-27 NOTE — Progress Notes (Signed)
Spoke with patient and translator to educate on drawing up insulin in syringe.  Patient stated that he had given insulin earlier in abdomen.  Worked with patient.  Very difficult with vision and not having glasses with him.  Not accurate at dosage.  The daughter or nephew will need to be instructed on insulin administration in order to be discharged on insulin.  The case manager is working on getting an appointment with [facility] after discharge.  The clinic does have samples of Levemir at this time.  The patient would be able to get it free if has an appointment at the clinic for followup. Spoke with staff RN about patient's progress with insulin administration. Was given DM general information in [place] language.  Will continue to follow while in hospital.  Harvel Ricks RN BSN CDE

## 2014-07-27 NOTE — Progress Notes (Signed)
PATIENT DETAILS Name: Brian Owen Age: 63 y.o. Sex: male Date of Birth: 1951/03/11 Admit Date: 07/22/2014 Admitting Physician Hosie Poisson, MD PCP:No PCP Per Patient  Brief Summary 63 year old male with history of uncontrolled diabetes (refused insulin in the past), recent history of MSSA bacteremia in July 2015 - not treated adequately as patient signed out [place]. Admitted for bilateral pyelonephritis, urine culture positive for MSSA. Current suspicion for ongoing MSSA infection with seeding in the kidneys causing bilateral pyelonephritis. Workup in progress, currently on Ancef, suspect would require at least 4-6 weeks of IV antibiotics.  Subjective: Diarrhea seems to have improved  Assessment/Plan: Active Problems: Systemic inflammatory response syndrome - Secondary to pyelonephritis.. - Treated with IV fluids, IV antibiotics for now, await further culture data and stool studies. Recent (July 2015) history of MSSA bacteremia, patient did not complete treatment and signed out [place], blood culture repeated on 9/25-while on Rocephin-negative so far-but given untreated MSSA bacteremia-now pyelonephritis-concerned that patient may have seeded kidneys (CT Abd shows has tiny cystic areas in b/l kidneys). Appreciate ID evaluation. No obvious vegetation on TTE-have consulted cardiology for TEE-tentatively scheduled for 07/28/14  Bilateral pyelonephritis - Likely causing above. - Currently afebrile, nontoxic looking. - Urine culture-MSSA, given recent hx  (July 2015) of MSSA Bacteremia not treated appropriately (signed out AMA)- stopped Rocephin and started Ancef. Given CT Abd shows has tiny cystic areas in b/l kidneys,concerned that patient may have seeded kidneys For now, Day 6 of antimicrobial therapy-Day 4 of Ancef.Per ID-will need 4-6 weeks of IV Abx, TEE scheduled for 9/30 am. Place PICC  Diarrhea -? Etiology-suspect infectious diarrhea. C. difficile  PCR negative, Stool culture and GI pathogen panel negative as well. Improving with prn Imodium, and probiotics. Follow-await further stool culture results and GI pathogen panel.    Hyponatremia - Secondary to dehydration, resolved with IV fluids. Monitor electrolytes periodically.  Hypokalemia -secondary to GI loss-replete and recheck in am  Recent MSSA bacteremia/Leg abscess - Did not complete treatment and signed out against medical advise. No evidence any residual thigh/leg abscess on exam.Repeat blood culture this admission negative-given Staph Aureus in Urine cs-started Ancef on 9/26. See above for further details    Diabetes mellitus type 2, uncontrolled - Unfortunately has refused to be on insulin in the past, A1c at 15.7. - Increased Levemir to 25 units, and 4 units of NovoLog with meals, continue with SSI. Had discussion with patient and daughter 9/26, seems to be agreeable to be discharged on Insulin. Begin insulin teaching. Per family, there is a nephew who has diabetes and is on insulin, he lives with the patient and can administer insulin at home as well.  Orthostatic Hypotension -secondary to diarrhea -stop Lisinopril, c/w IVF, treat diarrhea  Dizziness - Likely secondary to orthostatic hypotension in the setting of diarrhea/pyelonephritis. - supportive care  Disposition: Remain inpatient-home in next 2 days  DVT Prophylaxis: Prophylactic Lovenox   Code Status: Full code  Family Communication None at bedside  Procedures:  None  CONSULTS:  None   MEDICATIONS: Scheduled Meds: .  ceFAZolin (ANCEF) IV  1 g Intravenous 3 times per day  . enoxaparin (LOVENOX) injection  20 mg Subcutaneous Q24H  . feeding supplement (GLUCERNA SHAKE)  237 mL Oral TID BM  . insulin aspart  0-15 Units Subcutaneous TID WC  . insulin aspart  0-5 Units Subcutaneous QHS  . insulin aspart  4 Units Subcutaneous TID WC  . insulin detemir  25 Units Subcutaneous Daily  . saccharomyces  boulardii  250 mg Oral BID   Continuous Infusions: . sodium chloride 0.9 % 1,000 mL with potassium chloride 20 mEq infusion 75 mL/hr at 07/26/14 2203   PRN Meds:.acetaminophen, hydrALAZINE, loperamide, ondansetron (ZOFRAN) IV, ondansetron  Antibiotics: Anti-infectives   Start     Dose/Rate Route Frequency Ordered Stop   07/26/14 1400  ceFAZolin (ANCEF) IVPB 1 g/50 mL premix     1 g 100 mL/hr over 30 Minutes Intravenous 3 times per day 07/26/14 1248     07/24/14 1030  ceFAZolin (ANCEF) IVPB 2 g/50 mL premix  Status:  Discontinued     2 g 100 mL/hr over 30 Minutes Intravenous 3 times per day 07/24/14 1018 07/26/14 1248   07/23/14 1400  cefTRIAXone (ROCEPHIN) 1 g in dextrose 5 % 50 mL IVPB  Status:  Discontinued     1 g 100 mL/hr over 30 Minutes Intravenous Every 24 hours 07/22/14 1932 07/24/14 0943   07/22/14 1430  cefTRIAXone (ROCEPHIN) 1 g in dextrose 5 % 50 mL IVPB     1 g 100 mL/hr over 30 Minutes Intravenous  Once 07/22/14 1428 07/22/14 1626       PHYSICAL EXAM: Vital signs in last 24 hours: Filed Vitals:   07/26/14 0528 07/26/14 1408 07/26/14 2229 07/27/14 0631  BP: 117/66 156/89 148/77 134/75  Pulse: 79 105 92 81  Temp: 99.5 F (37.5 C) 98.9 F (37.2 C) 98.3 F (36.8 C) 98.2 F (36.8 C)  TempSrc: Oral Oral Oral Oral  Resp: 18 20 18 17   Height:      Weight:      SpO2: 100% 99% 99% 100%    Weight change:  Filed Weights   07/22/14 1452 07/22/14 1700  Weight: 39.463 kg (87 lb) 39.4 kg (86 lb 13.8 oz)   Body mass index is 14.45 kg/(m^2).   Gen Exam: Awake and alert with clear speech.   Neck: Supple, No JVD.   Chest: B/L Clear.  No rales/rhonchi CVS: S1 S2 Regular, no murmurs.  Abdomen: soft, BS +, non tender, non distended.  Extremities: no edema, lower extremities warm to touch. Neurologic: Non Focal.   Skin: No Rash.   Wounds: N/A.   Intake/Output from previous day:  Intake/Output Summary (Last 24 hours) at 07/27/14 1015 Last data filed at 07/27/14  1000  Gross per 24 hour  Intake 1078.25 ml  Output   1550 ml  Net -471.75 ml     LAB RESULTS: CBC  Recent Labs Lab 07/22/14 1236 07/24/14 0655  WBC 8.1 8.8  HGB 12.6* 10.9*  HCT 36.7* 32.6*  PLT 177 171  MCV 86.6 87.2  MCH 29.7 29.1  MCHC 34.3 33.4  RDW 13.8 14.2  LYMPHSABS 1.5  --   MONOABS 0.3  --   EOSABS 0.0  --   BASOSABS 0.0  --     Chemistries   Recent Labs Lab 07/22/14 1236 07/22/14 1934 07/24/14 0655 07/25/14 0553 07/26/14 0750  NA 134* 141 138 141 139  K 3.9 3.6* 3.1* 3.4* 4.2  CL 86* 95* 98 101 103  CO2 31 29 27 28 27   GLUCOSE 373* 299* 209* 152* 190*  BUN 14 14 11 9 11   CREATININE 0.69 0.78 0.72 0.79 0.75  CALCIUM 9.4 9.1 8.7 8.4 8.6    CBG:  Recent Labs Lab 07/26/14 0748 07/26/14 1150 07/26/14 1721 07/26/14 2202 07/27/14 0746  GLUCAP 216* 408* 175* 141* 188*    GFR Estimated Creatinine  Clearance: 52.7 ml/min (by C-G formula based on Cr of 0.75).  Coagulation profile No results found for this basename: INR, PROTIME,  in the last 168 hours  Cardiac Enzymes No results found for this basename: CK, CKMB, TROPONINI, MYOGLOBIN,  in the last 168 hours  No components found with this basename: POCBNP,  No results found for this basename: DDIMER,  in the last 72 hours No results found for this basename: HGBA1C,  in the last 72 hours No results found for this basename: CHOL, HDL, LDLCALC, TRIG, CHOLHDL, LDLDIRECT,  in the last 72 hours No results found for this basename: TSH, T4TOTAL, FREET3, T3FREE, THYROIDAB,  in the last 72 hours No results found for this basename: VITAMINB12, FOLATE, FERRITIN, TIBC, IRON, RETICCTPCT,  in the last 72 hours No results found for this basename: LIPASE, AMYLASE,  in the last 72 hours  Urine Studies No results found for this basename: UACOL, UAPR, USPG, UPH, UTP, UGL, UKET, UBIL, UHGB, UNIT, UROB, ULEU, UEPI, UWBC, URBC, UBAC, CAST, CRYS, UCOM, BILUA,  in the last 72 hours  MICROBIOLOGY: Recent Results  (from the past 240 hour(s))  URINE CULTURE     Status: None   Collection Time    07/22/14 12:24 PM      Result Value Ref Range Status   Specimen Description URINE, RANDOM   Final   Special Requests ADDED [id number]   Final   Culture  Setup Time     Final   Value: 07/22/2014 00:00     Performed at [facility]     Final   Value: >=100,000 COLONIES/ML     Performed at [facility]   Culture     Final   Value: STAPHYLOCOCCUS AUREUS     Note: RIFAMPIN AND GENTAMICIN SHOULD NOT BE USED AS SINGLE DRUGS FOR TREATMENT OF STAPH INFECTIONS.     Performed at [facility]   Report Status 07/24/2014 FINAL   Final   Organism ID, Bacteria STAPHYLOCOCCUS AUREUS   Final  CLOSTRIDIUM DIFFICILE BY PCR     Status: None   Collection Time    07/22/14  4:28 PM      Result Value Ref Range Status   C difficile by pcr NEGATIVE  NEGATIVE Final  CULTURE, BLOOD (ROUTINE X 2)     Status: None   Collection Time    07/23/14  9:20 AM      Result Value Ref Range Status   Specimen Description BLOOD RIGHT ANTECUBITAL   Final   Special Requests BOTTLES DRAWN AEROBIC AND ANAEROBIC 4.0CCS   Final   Culture  Setup Time     Final   Value: 07/23/2014 14:23     Performed at [facility]   Culture     Final   Value:        BLOOD CULTURE RECEIVED NO GROWTH TO DATE CULTURE WILL BE HELD FOR 5 DAYS BEFORE ISSUING A FINAL NEGATIVE REPORT     Performed at [facility]   Report Status PENDING   Incomplete  CULTURE, BLOOD (ROUTINE X 2)     Status: None   Collection Time    07/23/14  9:30 AM      Result Value Ref Range Status   Specimen Description BLOOD RIGHT ARM   Final   Special Requests BOTTLES DRAWN AEROBIC ONLY 4CC   Final   Culture  Setup Time     Final   Value: 07/23/2014 14:22  Performed at [facility]     Final   Value:        BLOOD CULTURE RECEIVED NO GROWTH TO DATE CULTURE WILL BE HELD FOR 5 DAYS BEFORE ISSUING A FINAL NEGATIVE  REPORT     Performed at [facility]   Report Status PENDING   Incomplete  STOOL CULTURE     Status: None   Collection Time    07/23/14  4:08 PM      Result Value Ref Range Status   Specimen Description STOOL   Final   Special Requests NONE   Final   Culture     Final   Value: MODERATE YEAST     NO SALMONELLA, SHIGELLA, CAMPYLOBACTER, YERSINIA, OR E.COLI 0157:H7 ISOLATED     Note: REDUCED NORMAL FLORA PRESENT     Performed at [facility]   Report Status 07/27/2014 FINAL   Final    RADIOLOGY STUDIES/RESULTS: Dg Chest 2 View  06/29/2014   CLINICAL DATA:  Chest pain with history of diabetes  EXAM: CHEST  2 VIEW  COMPARISON:  Portable chest X ray of May 13, 2014  FINDINGS: The lungs are mildly hyperinflated. There is no focal infiltrate. The heart and pulmonary vascularity are normal. There is no pleural effusion or pneumothorax. The mediastinum is normal in width. The bony thorax is unremarkable.  IMPRESSION: Mild chronic height per inflation is consistent with COPD. There is no acute cardiopulmonary abnormality.   Electronically Signed   By: David  Martinique   On: 06/29/2014 20:57   Ct Abdomen Pelvis W Contrast  07/23/2014   CLINICAL DATA:  Left lower quadrant pain.  EXAM: CT ABDOMEN AND PELVIS WITH CONTRAST  TECHNIQUE: Multidetector CT imaging of the abdomen and pelvis was performed using the standard protocol following bolus administration of intravenous contrast.  CONTRAST:  54mL OMNIPAQUE IOHEXOL 300 MG/ML  SOLN  COMPARISON:  None.  FINDINGS: BODY WALL: Paucity of fat.  LOWER CHEST: Diffuse coronary atherosclerosis.  ABDOMEN/PELVIS:  Liver: No focal abnormality.  Biliary: No evidence of biliary obstruction or stone.  Pancreas: Unremarkable.  Spleen: Unremarkable.  Adrenals: Unremarkable.  Kidneys and ureters: Patchy enhancement of the bilateral kidneys with a striated appearance favoring pyelonephritis over infarcts. No hydronephrosis. There are 2 left renal cysts which were  present on chest CT from 2009, the larger in the upper pole measuring 2 cm. No debris/fluid layers. Additional tiny (~5 mm)cystic areas in the bilateral kidneys could reflect some liquefaction, but no mature or drainable abscess.  Bladder: Moderately distended.  Reproductive: Unremarkable.  Bowel: No obstruction. Negative appendix.  Retroperitoneum: No mass or adenopathy.  Peritoneum: No ascites or pneumoperitoneum.  Vascular: Extensive aortoiliac atherosclerosis.  OSSEOUS: No acute abnormalities.  IMPRESSION: Bilateral pyelonephritis. No hydronephrosis or drainable collection.   Electronically Signed   By: Jorje Guild M.D.   On: 07/23/2014 01:33    Oren Binet, MD  [facility] Pager:[phone]  If 7PM-7AM, please contact night-coverage [email or web address] Password [email or web address] 07/27/2014, 10:15 AM   LOS: 5 days   **Disclaimer: This note may have been dictated with voice recognition software. Similar sounding words can inadvertently be transcribed and this note may contain transcription errors which may not have been corrected upon publication of note.**

## 2014-07-27 NOTE — Progress Notes (Signed)
INFECTIOUS DISEASE PROGRESS NOTE  ID: Brian Owen is a 63 y.o. male with  Active Problems:   Hyponatremia   Diabetes mellitus type 2, uncontrolled   UTI (lower urinary tract infection)   Dizziness   Abdominal pain, left lower quadrant   Protein-calorie malnutrition, severe  Subjective: Without complaints  Abtx:  Anti-infectives   Start     Dose/Rate Route Frequency Ordered Stop   07/26/14 1400  ceFAZolin (ANCEF) IVPB 1 g/50 mL premix     1 g 100 mL/hr over 30 Minutes Intravenous 3 times per day 07/26/14 1248     07/24/14 1030  ceFAZolin (ANCEF) IVPB 2 g/50 mL premix  Status:  Discontinued     2 g 100 mL/hr over 30 Minutes Intravenous 3 times per day 07/24/14 1018 07/26/14 1248   07/23/14 1400  cefTRIAXone (ROCEPHIN) 1 g in dextrose 5 % 50 mL IVPB  Status:  Discontinued     1 g 100 mL/hr over 30 Minutes Intravenous Every 24 hours 07/22/14 1932 07/24/14 0943   07/22/14 1430  cefTRIAXone (ROCEPHIN) 1 g in dextrose 5 % 50 mL IVPB     1 g 100 mL/hr over 30 Minutes Intravenous  Once 07/22/14 1428 07/22/14 1626      Medications:  Scheduled: .  ceFAZolin (ANCEF) IV  1 g Intravenous 3 times per day  . enoxaparin (LOVENOX) injection  20 mg Subcutaneous Q24H  . feeding supplement (GLUCERNA SHAKE)  237 mL Oral TID BM  . insulin aspart  0-15 Units Subcutaneous TID WC  . insulin aspart  0-5 Units Subcutaneous QHS  . insulin aspart  4 Units Subcutaneous TID WC  . insulin detemir  25 Units Subcutaneous Daily  . saccharomyces boulardii  250 mg Oral BID    Objective: Vital signs in last 24 hours: Temp:  [98 F (36.7 C)-98.3 F (36.8 C)] 98 F (36.7 C) (09/29 1351) Pulse Rate:  [81-92] 88 (09/29 1351) Resp:  [16-18] 16 (09/29 1351) BP: (134-152)/(75-82) 152/82 mmHg (09/29 1351) SpO2:  [99 %-100 %] 100 % (09/29 1351)   General appearance: alert, cooperative and no distress Resp: clear to auscultation bilaterally Cardio: regular rate and rhythm GI: normal findings: bowel  sounds normal and soft, non-tender  Lab Results  Recent Labs  07/25/14 0553 07/26/14 0750  NA 141 139  K 3.4* 4.2  CL 101 103  CO2 28 27  BUN 9 11  CREATININE 0.79 0.75   Liver Panel No results found for this basename: PROT, ALBUMIN, AST, ALT, ALKPHOS, BILITOT, BILIDIR, IBILI,  in the last 72 hours Sedimentation Rate No results found for this basename: ESRSEDRATE,  in the last 72 hours C-Reactive Protein No results found for this basename: CRP,  in the last 72 hours  Microbiology: Recent Results (from the past 240 hour(s))  URINE CULTURE     Status: None   Collection Time    07/22/14 12:24 PM      Result Value Ref Range Status   Specimen Description URINE, RANDOM   Final   Special Requests ADDED [id number]   Final   Culture  Setup Time     Final   Value: 07/22/2014 00:00     Performed at [facility]     Final   Value: >=100,000 COLONIES/ML     Performed at [facility]   Culture     Final   Value: STAPHYLOCOCCUS AUREUS     Note: RIFAMPIN AND GENTAMICIN SHOULD NOT  BE USED AS SINGLE DRUGS FOR TREATMENT OF STAPH INFECTIONS.     Performed at [facility]   Report Status 07/24/2014 FINAL   Final   Organism ID, Bacteria STAPHYLOCOCCUS AUREUS   Final  CLOSTRIDIUM DIFFICILE BY PCR     Status: None   Collection Time    07/22/14  4:28 PM      Result Value Ref Range Status   C difficile by pcr NEGATIVE  NEGATIVE Final  CULTURE, BLOOD (ROUTINE X 2)     Status: None   Collection Time    07/23/14  9:20 AM      Result Value Ref Range Status   Specimen Description BLOOD RIGHT ANTECUBITAL   Final   Special Requests BOTTLES DRAWN AEROBIC AND ANAEROBIC 4.0CCS   Final   Culture  Setup Time     Final   Value: 07/23/2014 14:23     Performed at [facility]   Culture     Final   Value:        BLOOD CULTURE RECEIVED NO GROWTH TO DATE CULTURE WILL BE HELD FOR 5 DAYS BEFORE ISSUING A FINAL NEGATIVE REPORT     Performed at [facility]   Report Status PENDING   Incomplete  CULTURE, BLOOD (ROUTINE X 2)     Status: None   Collection Time    07/23/14  9:30 AM      Result Value Ref Range Status   Specimen Description BLOOD RIGHT ARM   Final   Special Requests BOTTLES DRAWN AEROBIC ONLY 4CC   Final   Culture  Setup Time     Final   Value: 07/23/2014 14:22     Performed at [facility]   Culture     Final   Value:        BLOOD CULTURE RECEIVED NO GROWTH TO DATE CULTURE WILL BE HELD FOR 5 DAYS BEFORE ISSUING A FINAL NEGATIVE REPORT     Performed at [facility]   Report Status PENDING   Incomplete  STOOL CULTURE     Status: None   Collection Time    07/23/14  4:08 PM      Result Value Ref Range Status   Specimen Description STOOL   Final   Special Requests NONE   Final   Culture     Final   Value: MODERATE YEAST     NO SALMONELLA, SHIGELLA, CAMPYLOBACTER, YERSINIA, OR E.COLI 0157:H7 ISOLATED     Note: REDUCED NORMAL FLORA PRESENT     Performed at [facility]   Report Status 07/27/2014 FINAL   Final    Studies/Results: No results found.   Assessment/Plan: MSSA pyelonephritis, cystic renal lesions  Previous MSSA bacteremia (july 2015)  Uncontrolled DM2   Total days of antibiotics: 5 cefazolin  Await BCx done on anbx (9-25 ngtd) TEE to r/o IE in AM Will increase his ancef dose to 2g q8h Would plan for long course of anbx (4-6 weeks)  c diff (-) 4 day ago, out of isolation.          [name] Infectious Diseases (pager) [phone] [email or web address] 07/27/2014, 4:24 PM  LOS: 5 days

## 2014-07-27 NOTE — Progress Notes (Signed)
Peripherally Inserted Central Catheter/Midline Placement  The IV Nurse has discussed with the patient and/or persons authorized to consent for the patient, the purpose of this procedure and the potential benefits and risks involved with this procedure.  The benefits include less needle sticks, lab draws from the catheter and patient may be discharged home with the catheter.  Risks include, but not limited to, infection, bleeding, blood clot (thrombus formation), and puncture of an artery; nerve damage and irregular heat beat.  Alternatives to this procedure were also discussed.  Consent obtained by Fredrik Cove, RN with interpreter.  PICC/Midline Placement Documentation        Brian Owen, Brian Owen 07/27/2014, 3:34 PM

## 2014-07-27 NOTE — Progress Notes (Signed)
    [facility] has been requested to perform a transesophageal echocardiogram on 07/28/14 for bacteremia.  After careful review of history and examination, the risks and benefits of transesophageal echocardiogram have been explained including risks of esophageal damage, perforation (1:10,000 risk), bleeding, pharyngeal hematoma as well as other potential complications associated with conscious sedation including aspiration, arrhythmia, respiratory failure and death. Alternatives to treatment were discussed, questions were answered. Patient is willing to proceed.   Angelena Form R, NP 07/27/2014 1:15 PM

## 2014-07-28 ENCOUNTER — Encounter (HOSPITAL_COMMUNITY)
Admission: EM | Disposition: A | Discharge status: Home Health | Payer: Self-pay | Source: Home / Self Care | Attending: Internal Medicine

## 2014-07-28 DIAGNOSIS — Z789 Other specified health status: Secondary | ICD-10-CM

## 2014-07-28 DIAGNOSIS — E43 Unspecified severe protein-calorie malnutrition: Secondary | ICD-10-CM

## 2014-07-28 DIAGNOSIS — D72819 Decreased white blood cell count, unspecified: Secondary | ICD-10-CM

## 2014-07-28 DIAGNOSIS — A419 Sepsis, unspecified organism: Secondary | ICD-10-CM

## 2014-07-28 LAB — GLUCOSE, CAPILLARY
GLUCOSE-CAPILLARY: 283 mg/dL — AB (ref 70–99)
Glucose-Capillary: 111 mg/dL — ABNORMAL HIGH (ref 70–99)
Glucose-Capillary: 232 mg/dL — ABNORMAL HIGH (ref 70–99)
Glucose-Capillary: 111 mg/dL — ABNORMAL HIGH (ref 70–99)

## 2014-07-28 LAB — BASIC METABOLIC PANEL
Anion gap: 8 (ref 5–15)
BUN: 14 mg/dL (ref 6–23)
CO2: 30 meq/L (ref 19–32)
CREATININE: 0.64 mg/dL (ref 0.50–1.35)
Calcium: 8.7 mg/dL (ref 8.4–10.5)
Chloride: 97 mEq/L (ref 96–112)
GFR calc Af Amer: >90 mL/min (ref >90)
GFR calc non Af Amer: >90 mL/min (ref >90)
Glucose, Bld: 84 mg/dL (ref 70–99)
Potassium: 4.4 mEq/L (ref 3.7–5.3)
Sodium: 135 mEq/L — ABNORMAL LOW (ref 137–147)

## 2014-07-28 SURGERY — ECHOCARDIOGRAM, TRANSESOPHAGEAL
Anesthesia: Moderate Sedation

## 2014-07-28 MED ORDER — INSULIN STARTER KIT- PEN NEEDLES (ENGLISH)
1.0000 | Freq: Once | Status: AC
  Administered 2014-07-28: 1
  Filled 2014-07-28: qty 1

## 2014-07-28 MED ORDER — CEFAZOLIN SODIUM 1-5 GM-% IV SOLN: 1.0000 g | Freq: Three times a day (TID) | INTRAVENOUS | Status: DC

## 2014-07-28 MED ORDER — INSULIN DETEMIR 100 UNIT/ML FLEXPEN: 25.0000 [IU] | Freq: Every day | SUBCUTANEOUS | Status: DC

## 2014-07-28 MED ORDER — HEPARIN SOD (PORK) LOCK FLUSH 100 UNIT/ML IV SOLN
250.0000 [IU] | INTRAVENOUS | Status: AC | PRN
  Administered 2014-07-28: 250 [IU]

## 2014-07-28 NOTE — Discharge Summary (Signed)
Physician Discharge Summary  Brian Owen [id number] DOB: June 05, 1951 DOA: 07/22/2014  PCP: No PCP Per Patient  Admit date: 07/22/2014 Discharge date: 07/28/2014  Time spent: 40 minutes  Recommendations for Outpatient Follow-up:  1. Followup with infectious disease in 2-3 weeks. 2. Continue Ancef for total of 6 weeks, Finish date is 09/05/14.  Discharge Diagnoses:  Active Problems:   Hyponatremia   Diabetes mellitus type 2, uncontrolled   UTI (lower urinary tract infection)   Dizziness   Abdominal pain, left lower quadrant   Protein-calorie malnutrition, severe   Discharge Condition: Stable  Diet recommendation: Our modified diet  Filed Weights   07/22/14 1452 07/22/14 1700  Weight: 39.463 kg (87 lb) 39.4 kg (86 lb 13.8 oz)    History of present illness:  Brian Owen is a 63 y.o. male who speaks [place], has a h/o of hypertension( not on any medications), DM( takes metformin) , very noncompliant to meds and office visits, comes in for worsening abdominal pain, nausea, dizziness since last night. Patient is a poor historian, no family at bedside. Most of the history obtained from the patient was obtained using the interpretor and from [place]. As per the patient he had severe left lower quadrant pain and could not sleep and also reports diarrhea. This morning he went to use the bathroom and felt very dizzy. He was brought to ED by a family member. On arrival to ED, his orthostatics were negative. Hi cbgs were elevated in 300 to 400's without any acidosis. His UA revealed UTI. He was referred to medical service for admission , for evaluation of left lower quadrant abdominal pain , diarrhea and UTI.   Hospital Course:   Sepsis secondary to MSSA bacteremia -Treated with IV fluids, IV antibiotics for now, await further culture data and stool studies.  -Recent (July 2015) history of MSSA bacteremia, patient did not complete treatment and signed out AMA. -Blood culture repeated  And grew  MSSA bacteremia. -Concerned that patient may have seeded kidneys (CT Abd shows has tiny cystic areas in b/l kidneys).  -ID recommended TEE, patient refused yesterday, I had a long discussion today with the patient utilizing the interpreter at bedside but he still refusing. -Patient has tiny about 5 mm cystic areas in both kidneys which new since last study could represent liquefaction. -We will treat empirically for 6 weeks of antibiotics.  Bilateral pyelonephritis  -Likely causing above.  -Currently afebrile, nontoxic looking.  -Urine culture-MSSA, given recent hx (July 2015) of MSSA Bacteremia not treated appropriately (signed out AMA)- stopped Rocephin and started Ancef. Given CT Abd shows has tiny cystic areas in b/l kidneys,concerned that patient may have seeded kidneys For now, Day 6 of antimicrobial therapy-Day 4 of Ancef.Per ID-will need 4-6 weeks of IV Abx, TEE scheduled for 9/30 am.  -Place PICC.  Diarrhea  -? Etiology-suspect infectious diarrhea. C. difficile PCR negative, Stool culture and GI pathogen panel negative as well. Improving with prn Imodium, and probiotics. Follow-await further stool culture results and GI pathogen panel.   Hyponatremia  - Secondary to dehydration, resolved with IV fluids. Monitor electrolytes periodically.   Hypokalemia  -secondary to GI loss-replete and recheck in am   Recent MSSA bacteremia/Leg abscess  -Did not complete treatment and signed out against medical advise. No evidence any residual thigh/leg abscess on exam.Repeat blood culture this admission negative-given Staph Aureus in Urine cs-started Ancef on 9/26.  -See above for further details   Diabetes mellitus type 2, uncontrolled  - Unfortunately has refused to  be on insulin in the past, A1c at 15.7.  - Increased Levemir to 25 units, and 4 units of NovoLog with meals, continue with SSI. Had discussion with patient and daughter 9/26, seems to be agreeable to be discharged on Insulin. Begin  insulin teaching. Per family, there is a nephew who has diabetes and is on insulin, he lives with the patient and can administer insulin at home as well.   Orthostatic Hypotension  -Secondary to diarrhea  -Stop Lisinopril, c/w IVF, treat diarrhea   Dizziness  -Likely secondary to orthostatic hypotension in the setting of diarrhea/pyelonephritis.  -Supportive care.      Procedures:  None  Consultations:  Infectious diseases  Discharge Exam: Filed Vitals:   07/28/14 0551  BP: 127/43  Pulse: 62  Temp: 98.3 F (36.8 C)  Resp: 16   General: Alert and awake, oriented x3, not in any acute distress. HEENT: anicteric sclera, pupils reactive to light and accommodation, EOMI CVS: S1-S2 clear, no murmur rubs or gallops Chest: clear to auscultation bilaterally, no wheezing, rales or rhonchi Abdomen: soft nontender, nondistended, normal bowel sounds, no organomegaly Extremities: no cyanosis, clubbing or edema noted bilaterally Neuro: Cranial nerves II-XII intact, no focal neurological deficits  Discharge Instructions You were cared for by a hospitalist during your hospital stay. If you have any questions about your discharge medications or the care you received while you were in the hospital after you are discharged, you can call the unit and asked to speak with the hospitalist on call if the hospitalist that took care of you is not available. Once you are discharged, your primary care physician will handle any further medical issues. Please note that NO REFILLS for any discharge medications will be authorized once you are discharged, as it is imperative that you return to your primary care physician (or establish a relationship with a primary care physician if you do not have one) for your aftercare needs so that they can reassess your need for medications and monitor your lab values.  Discharge Instructions   Diet Carb Modified    Complete by:  As directed      Increase activity  slowly    Complete by:  As directed           Current Discharge Medication List    START taking these medications   Details  ceFAZolin (ANCEF) 1-5 GM-% Inject 50 mLs (1 g total) into the vein every 8 (eight) hours. Qty: 50 mL, Refills: 30    insulin detemir (LEVEMIR) 100 unit/ml SOLN Inject 0.25 mLs (25 Units total) into the skin at bedtime. Qty: 3 mL, Refills: 4      CONTINUE these medications which have NOT CHANGED   Details  metFORMIN (GLUCOPHAGE) 1000 MG tablet Take 1,000 mg by mouth 2 (two) times daily with a meal.       No Known Allergies Follow-up Information   Follow up with [facility]     On 08/02/2014. (9:45 for hospital follow up )    Contact information:   [address] [place] [id number] [phone]       The results of significant diagnostics from this hospitalization (including imaging, microbiology, ancillary and laboratory) are listed below for reference.    Significant Diagnostic Studies: Dg Chest 2 View  06/29/2014   CLINICAL DATA:  Chest pain with history of diabetes  EXAM: CHEST  2 VIEW  COMPARISON:  Portable chest X ray of May 13, 2014  FINDINGS:  The lungs are mildly hyperinflated. There is no focal infiltrate. The heart and pulmonary vascularity are normal. There is no pleural effusion or pneumothorax. The mediastinum is normal in width. The bony thorax is unremarkable.  IMPRESSION: Mild chronic height per inflation is consistent with COPD. There is no acute cardiopulmonary abnormality.   Electronically Signed   By: David  Martinique   On: 06/29/2014 20:57   Ct Abdomen Pelvis W Contrast  07/23/2014   CLINICAL DATA:  Left lower quadrant pain.  EXAM: CT ABDOMEN AND PELVIS WITH CONTRAST  TECHNIQUE: Multidetector CT imaging of the abdomen and pelvis was performed using the standard protocol following bolus administration of intravenous contrast.  CONTRAST:  40mL OMNIPAQUE IOHEXOL 300 MG/ML  SOLN  COMPARISON:  None.   FINDINGS: BODY WALL: Paucity of fat.  LOWER CHEST: Diffuse coronary atherosclerosis.  ABDOMEN/PELVIS:  Liver: No focal abnormality.  Biliary: No evidence of biliary obstruction or stone.  Pancreas: Unremarkable.  Spleen: Unremarkable.  Adrenals: Unremarkable.  Kidneys and ureters: Patchy enhancement of the bilateral kidneys with a striated appearance favoring pyelonephritis over infarcts. No hydronephrosis. There are 2 left renal cysts which were present on chest CT from 2009, the larger in the upper pole measuring 2 cm. No debris/fluid layers. Additional tiny (~5 mm)cystic areas in the bilateral kidneys could reflect some liquefaction, but no mature or drainable abscess.  Bladder: Moderately distended.  Reproductive: Unremarkable.  Bowel: No obstruction. Negative appendix.  Retroperitoneum: No mass or adenopathy.  Peritoneum: No ascites or pneumoperitoneum.  Vascular: Extensive aortoiliac atherosclerosis.  OSSEOUS: No acute abnormalities.  IMPRESSION: Bilateral pyelonephritis. No hydronephrosis or drainable collection.   Electronically Signed   By: Jorje Guild M.D.   On: 07/23/2014 01:33    Microbiology: Recent Results (from the past 240 hour(s))  URINE CULTURE     Status: None   Collection Time    07/22/14 12:24 PM      Result Value Ref Range Status   Specimen Description URINE, RANDOM   Final   Special Requests ADDED [id number]   Final   Culture  Setup Time     Final   Value: 07/22/2014 00:00     Performed at [facility] Count     Final   Value: >=100,000 COLONIES/ML     Performed at [facility]   Culture     Final   Value: STAPHYLOCOCCUS AUREUS     Note: RIFAMPIN AND GENTAMICIN SHOULD NOT BE USED AS SINGLE DRUGS FOR TREATMENT OF STAPH INFECTIONS.     Performed at [facility]   Report Status 07/24/2014 FINAL   Final   Organism ID, Bacteria STAPHYLOCOCCUS AUREUS   Final  CLOSTRIDIUM DIFFICILE BY PCR     Status: None   Collection Time    07/22/14   4:28 PM      Result Value Ref Range Status   C difficile by pcr NEGATIVE  NEGATIVE Final  CULTURE, BLOOD (ROUTINE X 2)     Status: None   Collection Time    07/23/14  9:20 AM      Result Value Ref Range Status   Specimen Description BLOOD RIGHT ANTECUBITAL   Final   Special Requests BOTTLES DRAWN AEROBIC AND ANAEROBIC 4.0CCS   Final   Culture  Setup Time     Final   Value: 07/23/2014 14:23     Performed at [facility]   Culture     Final   Value:  BLOOD CULTURE RECEIVED NO GROWTH TO DATE CULTURE WILL BE HELD FOR 5 DAYS BEFORE ISSUING A FINAL NEGATIVE REPORT     Performed at [facility]   Report Status PENDING   Incomplete  CULTURE, BLOOD (ROUTINE X 2)     Status: None   Collection Time    07/23/14  9:30 AM      Result Value Ref Range Status   Specimen Description BLOOD RIGHT ARM   Final   Special Requests BOTTLES DRAWN AEROBIC ONLY 4CC   Final   Culture  Setup Time     Final   Value: 07/23/2014 14:22     Performed at [facility]   Culture     Final   Value:        BLOOD CULTURE RECEIVED NO GROWTH TO DATE CULTURE WILL BE HELD FOR 5 DAYS BEFORE ISSUING A FINAL NEGATIVE REPORT     Performed at [facility]   Report Status PENDING   Incomplete  STOOL CULTURE     Status: None   Collection Time    07/23/14  4:08 PM      Result Value Ref Range Status   Specimen Description STOOL   Final   Special Requests NONE   Final   Culture     Final   Value: MODERATE YEAST     NO SALMONELLA, SHIGELLA, CAMPYLOBACTER, YERSINIA, OR E.COLI 0157:H7 ISOLATED     Note: REDUCED NORMAL FLORA PRESENT     Performed at [facility]   Report Status 07/27/2014 FINAL   Final     Labs: Basic Metabolic Panel:  Recent Labs Lab 07/22/14 1934 07/24/14 0655 07/25/14 0553 07/26/14 0750 07/28/14 0505  NA 141 138 141 139 135*  K 3.6* 3.1* 3.4* 4.2 4.4  CL 95* 98 101 103 97  CO2 29 27 28 27 30   GLUCOSE 299* 209* 152* 190* 84  BUN 14 11 9 11 14    CREATININE 0.78 0.72 0.79 0.75 0.64  CALCIUM 9.1 8.7 8.4 8.6 8.7   Liver Function Tests:  Recent Labs Lab 07/22/14 1236  AST 13  ALT 9  ALKPHOS 125*  BILITOT 0.5  PROT 8.9*  ALBUMIN 3.0*    Recent Labs Lab 07/22/14 1236  LIPASE 32   No results found for this basename: AMMONIA,  in the last 168 hours CBC:  Recent Labs Lab 07/22/14 1236 07/24/14 0655  WBC 8.1 8.8  NEUTROABS 6.2  --   HGB 12.6* 10.9*  HCT 36.7* 32.6*  MCV 86.6 87.2  PLT 177 171   Cardiac Enzymes: No results found for this basename: CKTOTAL, CKMB, CKMBINDEX, TROPONINI,  in the last 168 hours BNP: BNP (last 3 results) No results found for this basename: PROBNP,  in the last 8760 hours CBG:  Recent Labs Lab 07/27/14 1352 07/27/14 1657 07/27/14 2201 07/28/14 0752 07/28/14 1201  GLUCAP 295* 313* 140* 111* 283*       Signed:  Jhonny Calixto A  [facility] 07/28/2014, 12:52 PM

## 2014-07-28 NOTE — Progress Notes (Signed)
INFECTIOUS DISEASE PROGRESS NOTE  ID: Brian Owen is a 63 y.o. male with  Active Problems:   Hyponatremia   Diabetes mellitus type 2, uncontrolled   UTI (lower urinary tract infection)   Dizziness   Abdominal pain, left lower quadrant   Protein-calorie malnutrition, severe  Subjective: Without complaints. Ride coming at 8pm  Abtx:  Anti-infectives   Start     Dose/Rate Route Frequency Ordered Stop   07/28/14 0000  ceFAZolin (ANCEF) 1-5 GM-%    Comments:  Antibiotics for total of 6 weeks and date is 09/05/14   1 g 100 mL/hr over 30 Minutes Intravenous Every 8 hours 07/28/14 1251     07/26/14 1400  ceFAZolin (ANCEF) IVPB 1 g/50 mL premix     1 g 100 mL/hr over 30 Minutes Intravenous 3 times per day 07/26/14 1248     07/24/14 1030  ceFAZolin (ANCEF) IVPB 2 g/50 mL premix  Status:  Discontinued     2 g 100 mL/hr over 30 Minutes Intravenous 3 times per day 07/24/14 1018 07/26/14 1248   07/23/14 1400  cefTRIAXone (ROCEPHIN) 1 g in dextrose 5 % 50 mL IVPB  Status:  Discontinued     1 g 100 mL/hr over 30 Minutes Intravenous Every 24 hours 07/22/14 1932 07/24/14 0943   07/22/14 1430  cefTRIAXone (ROCEPHIN) 1 g in dextrose 5 % 50 mL IVPB     1 g 100 mL/hr over 30 Minutes Intravenous  Once 07/22/14 1428 07/22/14 1626      Medications:  Scheduled: .  ceFAZolin (ANCEF) IV  1 g Intravenous 3 times per day  . enoxaparin (LOVENOX) injection  20 mg Subcutaneous Q24H  . feeding supplement (GLUCERNA SHAKE)  237 mL Oral TID BM  . insulin aspart  0-15 Units Subcutaneous TID WC  . insulin aspart  0-5 Units Subcutaneous QHS  . insulin aspart  4 Units Subcutaneous TID WC  . insulin detemir  25 Units Subcutaneous Daily  . saccharomyces boulardii  250 mg Oral BID    Objective: Vital signs in last 24 hours: Temp:  [97.8 F (36.6 C)-99.1 F (37.3 C)] 97.8 F (36.6 C) (09/30 1353) Pulse Rate:  [62-103] 97 (09/30 1353) Resp:  [16-18] 18 (09/30 1353) BP: (92-141)/(43-75) 141/75 mmHg (09/30  1353) SpO2:  [97 %-100 %] 100 % (09/30 1353)   General appearance: alert, cooperative and no distress Resp: clear to auscultation bilaterally Cardio: regular rate and rhythm GI: normal findings: bowel sounds normal and soft, non-tender  Lab Results  Recent Labs  07/26/14 0750 07/28/14 0505  NA 139 135*  K 4.2 4.4  CL 103 97  CO2 27 30  BUN 11 14  CREATININE 0.75 0.64   Liver Panel No results found for this basename: PROT, ALBUMIN, AST, ALT, ALKPHOS, BILITOT, BILIDIR, IBILI,  in the last 72 hours Sedimentation Rate No results found for this basename: ESRSEDRATE,  in the last 72 hours C-Reactive Protein No results found for this basename: CRP,  in the last 72 hours  Microbiology: Recent Results (from the past 240 hour(s))  URINE CULTURE     Status: None   Collection Time    07/22/14 12:24 PM      Result Value Ref Range Status   Specimen Description URINE, RANDOM   Final   Special Requests ADDED [id number]   Final   Culture  Setup Time     Final   Value: 07/22/2014 00:00     Performed at [facility]  [place] Count     Final   Value: >=100,000 COLONIES/ML     Performed at [facility]   Culture     Final   Value: STAPHYLOCOCCUS AUREUS     Note: RIFAMPIN AND GENTAMICIN SHOULD NOT BE USED AS SINGLE DRUGS FOR TREATMENT OF STAPH INFECTIONS.     Performed at [facility]   Report Status 07/24/2014 FINAL   Final   Organism ID, Bacteria STAPHYLOCOCCUS AUREUS   Final  CLOSTRIDIUM DIFFICILE BY PCR     Status: None   Collection Time    07/22/14  4:28 PM      Result Value Ref Range Status   C difficile by pcr NEGATIVE  NEGATIVE Final  CULTURE, BLOOD (ROUTINE X 2)     Status: None   Collection Time    07/23/14  9:20 AM      Result Value Ref Range Status   Specimen Description BLOOD RIGHT ANTECUBITAL   Final   Special Requests BOTTLES DRAWN AEROBIC AND ANAEROBIC 4.0CCS   Final   Culture  Setup Time     Final   Value: 07/23/2014 14:23      Performed at [facility]   Culture     Final   Value:        BLOOD CULTURE RECEIVED NO GROWTH TO DATE CULTURE WILL BE HELD FOR 5 DAYS BEFORE ISSUING A FINAL NEGATIVE REPORT     Performed at [facility]   Report Status PENDING   Incomplete  CULTURE, BLOOD (ROUTINE X 2)     Status: None   Collection Time    07/23/14  9:30 AM      Result Value Ref Range Status   Specimen Description BLOOD RIGHT ARM   Final   Special Requests BOTTLES DRAWN AEROBIC ONLY 4CC   Final   Culture  Setup Time     Final   Value: 07/23/2014 14:22     Performed at [facility]   Culture     Final   Value:        BLOOD CULTURE RECEIVED NO GROWTH TO DATE CULTURE WILL BE HELD FOR 5 DAYS BEFORE ISSUING A FINAL NEGATIVE REPORT     Performed at [facility]   Report Status PENDING   Incomplete  STOOL CULTURE     Status: None   Collection Time    07/23/14  4:08 PM      Result Value Ref Range Status   Specimen Description STOOL   Final   Special Requests NONE   Final   Culture     Final   Value: MODERATE YEAST     NO SALMONELLA, SHIGELLA, CAMPYLOBACTER, YERSINIA, OR E.COLI 0157:H7 ISOLATED     Note: REDUCED NORMAL FLORA PRESENT     Performed at [facility]   Report Status 07/27/2014 FINAL   Final    Studies/Results: No results found.   Assessment/Plan: MSSA pyelonephritis, cystic renal lesions  Previous MSSA bacteremia (july 2015)  Uncontrolled DM2   Total days of antibiotics: 6 cefazolin  Pt has refused TEE Would aim for 36 more days of ancef Can be seen in ID clinic for f/u.          [name] Infectious Diseases (pager) [phone] [email or web address] 07/28/2014, 7:03 PM  LOS: 6 days

## 2014-07-28 NOTE — Progress Notes (Signed)
Pt given AVS handout. Discharge information given to patient daughter; verbalized understanding and denied any further questions.  Pt was in stable condition prior to discharge; no change to assessment noted. IV team disconnected PICC line to go home with PICC. All belongings and prescriptions sent home.

## 2014-07-28 NOTE — Progress Notes (Signed)
Spoke with family regarding concerns or questions about the diabetes and insulin administration. Brother of patient states he knows how to use a syringe as well as an insulin pen as he himself has used both. Spoke with Neoma Laming, Care Manager who states pt has f/up appt at [facility]. Pt does have insurance to cover cost of medications.  Due to pt having blurred vision presently, an insulin pen would be preferable at discharge; however the brother can draw or dial up as needed. No further questions at this time. Pt to be discharged after the afternoon antibiotic has been given,. HHRN with patient and family presently. Thank you, Rosita Kea, RN, CNS, Diabetes Coordinator [phone])

## 2014-07-29 DIAGNOSIS — E119 Type 2 diabetes mellitus without complications: Secondary | ICD-10-CM | POA: Diagnosis not present

## 2014-07-29 DIAGNOSIS — E43 Unspecified severe protein-calorie malnutrition: Secondary | ICD-10-CM | POA: Diagnosis not present

## 2014-07-29 DIAGNOSIS — Z794 Long term (current) use of insulin: Secondary | ICD-10-CM | POA: Diagnosis not present

## 2014-07-29 DIAGNOSIS — N390 Urinary tract infection, site not specified: Secondary | ICD-10-CM | POA: Diagnosis not present

## 2014-07-29 DIAGNOSIS — Z452 Encounter for adjustment and management of vascular access device: Secondary | ICD-10-CM | POA: Diagnosis not present

## 2014-07-29 LAB — CULTURE, BLOOD (ROUTINE X 2)
Culture: NO GROWTH
Culture: NO GROWTH

## 2014-08-02 ENCOUNTER — Encounter: Payer: Self-pay | Admitting: Family Medicine

## 2014-08-02 ENCOUNTER — Ambulatory Visit: Payer: Medicare Other | Attending: Family Medicine | Admitting: Family Medicine

## 2014-08-02 VITALS — BP 130/80 | HR 93 | Temp 94.4°F | Resp 18 | Ht 65.0 in | Wt 95.8 lb

## 2014-08-02 DIAGNOSIS — E43 Unspecified severe protein-calorie malnutrition: Secondary | ICD-10-CM | POA: Diagnosis not present

## 2014-08-02 DIAGNOSIS — N390 Urinary tract infection, site not specified: Secondary | ICD-10-CM | POA: Diagnosis not present

## 2014-08-02 DIAGNOSIS — Z681 Body mass index (BMI) 19 or less, adult: Secondary | ICD-10-CM | POA: Diagnosis not present

## 2014-08-02 DIAGNOSIS — R739 Hyperglycemia, unspecified: Secondary | ICD-10-CM | POA: Diagnosis not present

## 2014-08-02 DIAGNOSIS — B9561 Methicillin susceptible Staphylococcus aureus infection as the cause of diseases classified elsewhere: Secondary | ICD-10-CM | POA: Insufficient documentation

## 2014-08-02 DIAGNOSIS — Z794 Long term (current) use of insulin: Secondary | ICD-10-CM | POA: Diagnosis not present

## 2014-08-02 DIAGNOSIS — E119 Type 2 diabetes mellitus without complications: Secondary | ICD-10-CM | POA: Diagnosis not present

## 2014-08-02 DIAGNOSIS — IMO0002 Reserved for concepts with insufficient information to code with codable children: Secondary | ICD-10-CM

## 2014-08-02 DIAGNOSIS — E11649 Type 2 diabetes mellitus with hypoglycemia without coma: Secondary | ICD-10-CM | POA: Insufficient documentation

## 2014-08-02 DIAGNOSIS — E1165 Type 2 diabetes mellitus with hyperglycemia: Secondary | ICD-10-CM | POA: Diagnosis not present

## 2014-08-02 DIAGNOSIS — R680 Hypothermia, not associated with low environmental temperature: Secondary | ICD-10-CM | POA: Insufficient documentation

## 2014-08-02 DIAGNOSIS — T68XXXA Hypothermia, initial encounter: Secondary | ICD-10-CM | POA: Insufficient documentation

## 2014-08-02 LAB — CBC
HEMATOCRIT: 32.3 % — AB (ref 39.0–52.0)
HEMOGLOBIN: 10.8 g/dL — AB (ref 13.0–17.0)
MCH: 29.1 pg (ref 26.0–34.0)
MCHC: 33.4 g/dL (ref 30.0–36.0)
MCV: 87.1 fL (ref 78.0–100.0)
Platelets: 324 10*3/uL (ref 150–400)
RBC: 3.71 MIL/uL — ABNORMAL LOW (ref 4.22–5.81)
RDW: 15.1 % (ref 11.5–15.5)
WBC: 7.3 10*3/uL (ref 4.0–10.5)

## 2014-08-02 LAB — GLUCOSE, POCT (MANUAL RESULT ENTRY)
POC Glucose: 48 mg/dl — AB (ref 70–99)
POC Glucose: 114 mg/dl — AB (ref 70–99)
POC Glucose: 167 mg/dl — AB (ref 70–99)

## 2014-08-02 MED ORDER — GLUCOSE BLOOD VI STRP: 1.0000 | ORAL_STRIP | Freq: Three times a day (TID) | Status: DC

## 2014-08-02 MED ORDER — SITAGLIPTIN PHOSPHATE 100 MG PO TABS: 100.0000 mg | ORAL_TABLET | Freq: Every day | ORAL | Status: DC

## 2014-08-02 MED ORDER — ACCU-CHEK FASTCLIX LANCETS MISC: 1.0000 | Freq: Three times a day (TID) | Status: DC

## 2014-08-02 MED ORDER — ENSURE HIGH PROTEIN PO POWD: 4.0000 [oz_av] | Freq: Two times a day (BID) | ORAL | Status: DC

## 2014-08-02 MED ORDER — ACCU-CHEK NANO SMARTVIEW W/DEVICE KIT: 1.0000 | PACK | Status: DC | PRN

## 2014-08-02 NOTE — Progress Notes (Signed)
Establish Care HFU due to elevated blood sugar, pt stated doing better  Complaining with diarrhea and HA worse at nigh time

## 2014-08-02 NOTE — Assessment & Plan Note (Addendum)
A: MSSA UTI on IV Ancef. Patient with mild hypothermia, hypoglycemia improved after eating. Normal exam.  P: F/u CBC Continue IV ancef ID referral for hospital f/u

## 2014-08-02 NOTE — Progress Notes (Signed)
   Subjective:    Patient ID: Brian Owen, male    DOB: April 29, 1951, 63 y.o.   MRN: [id number] CC: HFU for diabetes and UTI  HPI 63 yo [place] male presents for f/u visit:   1. IDDM type 2: patient recently started on levemir. Previously on metformin only. Metformin stopped due to diarrhea x 2-3 months. Some improvement since stopping metformin. Has numbness in both feet. More forgetful per family. Has lost weight. No GI upset, CP, SOB.  2. MSSA UTI: on Ancef TID. No dysuria, pelvic pain or flank pain. Needs ID f/u   3. Hypothermia: noted today. With hypoglycemia that improved after eating snacks and gatorade. ROS as per above. Patient denies being cold. Took insulin last night. WBC was normal at hospital discharge.   Review of Systems As per HPI     Objective:   Physical Exam BP 130/80  Pulse 93  Resp 18  Ht 5\' 5"  (1.651 m)  Wt 95 lb 12.8 oz (43.455 kg)  BMI 15.94 kg/m2  SpO2 100% General appearance: alert, cooperative and no distress, thin adult male  Lungs: clear to auscultation bilaterally Heart: regular rate and rhythm, S1, S2 normal, no murmur, click, rub or gallop Abdomen: soft, non-tender; bowel sounds normal; no masses,  no organomegaly Extremities: extremities normal, atraumatic, no cyanosis or edema RUE: IV site w/o erythema, non tender, no induration   Lab Results  Component Value Date   HGBA1C 15.7* 07/22/2014   CBG 48 >, snacks and gatorade given, 114, > 167      Assessment & Plan:

## 2014-08-02 NOTE — Assessment & Plan Note (Signed)
A: hypoglycemia today, asyptomatic P: Meter prescribed Continue levemir 25 U q HS januvia DCd metformin due to diarrhea

## 2014-08-02 NOTE — Assessment & Plan Note (Signed)
Ensure between meals

## 2014-08-02 NOTE — Assessment & Plan Note (Addendum)
A: mild hypothermia with rectal temp of 94.4, in the setting of hypoglycemia. No signs of infection at IV site or on exam.  P:  Repeat CBC Reviewed s/s to prompt patient to go ED

## 2014-08-02 NOTE — Patient Instructions (Addendum)
Brian Owen,  Thank you for coming in today. It was a pleasure meeting you. I look forward to being your primary doctor.   For low temperature; temp is 94.4 F, rechecking white count. If he starts to shiver are have altered level of consciousness please go to the ED.   For diabetes: no more metformin. Continue levemir Take Celesta Gentile once daily Check and write down blood sugars  Meter, supplies and januvia sent to [facility].  You will be called with appt with infectious disease.  F/u with RN to review blood sugars in 4 weeks  F/u with me in 6 weeks  Dr. Adrian Blackwater

## 2014-08-03 DIAGNOSIS — Z794 Long term (current) use of insulin: Secondary | ICD-10-CM | POA: Diagnosis not present

## 2014-08-03 DIAGNOSIS — N390 Urinary tract infection, site not specified: Secondary | ICD-10-CM | POA: Diagnosis not present

## 2014-08-03 DIAGNOSIS — E43 Unspecified severe protein-calorie malnutrition: Secondary | ICD-10-CM | POA: Diagnosis not present

## 2014-08-03 DIAGNOSIS — E119 Type 2 diabetes mellitus without complications: Secondary | ICD-10-CM | POA: Diagnosis not present

## 2014-08-03 DIAGNOSIS — N10 Acute tubulo-interstitial nephritis: Secondary | ICD-10-CM | POA: Diagnosis not present

## 2014-08-03 DIAGNOSIS — Z452 Encounter for adjustment and management of vascular access device: Secondary | ICD-10-CM | POA: Diagnosis not present

## 2014-08-05 DIAGNOSIS — N390 Urinary tract infection, site not specified: Secondary | ICD-10-CM | POA: Diagnosis not present

## 2014-08-05 DIAGNOSIS — Z452 Encounter for adjustment and management of vascular access device: Secondary | ICD-10-CM | POA: Diagnosis not present

## 2014-08-05 DIAGNOSIS — E43 Unspecified severe protein-calorie malnutrition: Secondary | ICD-10-CM | POA: Diagnosis not present

## 2014-08-05 DIAGNOSIS — Z794 Long term (current) use of insulin: Secondary | ICD-10-CM | POA: Diagnosis not present

## 2014-08-05 DIAGNOSIS — E119 Type 2 diabetes mellitus without complications: Secondary | ICD-10-CM | POA: Diagnosis not present

## 2014-08-10 ENCOUNTER — Encounter: Payer: Self-pay | Admitting: *Deleted

## 2014-08-10 ENCOUNTER — Telehealth: Payer: Self-pay | Admitting: Family Medicine

## 2014-08-10 DIAGNOSIS — Z452 Encounter for adjustment and management of vascular access device: Secondary | ICD-10-CM | POA: Diagnosis not present

## 2014-08-10 DIAGNOSIS — N390 Urinary tract infection, site not specified: Secondary | ICD-10-CM | POA: Diagnosis not present

## 2014-08-10 DIAGNOSIS — Z794 Long term (current) use of insulin: Secondary | ICD-10-CM | POA: Diagnosis not present

## 2014-08-10 DIAGNOSIS — E119 Type 2 diabetes mellitus without complications: Secondary | ICD-10-CM | POA: Diagnosis not present

## 2014-08-10 DIAGNOSIS — E43 Unspecified severe protein-calorie malnutrition: Secondary | ICD-10-CM | POA: Diagnosis not present

## 2014-08-10 LAB — CBC
BUN: 17
CREATININE: 0.8
HEMATOCRIT: 31 %
Hemoglobin: 10.4 g/dL — AB (ref 13–17)
MCV: 86.2 fL (ref 80–98)
Platelets: 261
Sed Rate: 129 mm
WBC: 5.5

## 2014-08-10 NOTE — Telephone Encounter (Signed)
[facility] nurse calling to report pt's blood sugars ranging from 50's-400's and temperature is running low today, 94.3 axillary. Nurse contacted pt's infectious disease provider, Dr Drucilla Schmidt, with readings as well.  Please f/u with nurse with any advice for patient.

## 2014-08-10 NOTE — Telephone Encounter (Signed)
Error

## 2014-08-11 NOTE — Telephone Encounter (Signed)
Advice for pt to go to urgent care of ER if Sx continue PCP not in the office till next week

## 2014-08-12 ENCOUNTER — Telehealth: Payer: Self-pay | Admitting: Family Medicine

## 2014-08-12 DIAGNOSIS — Z452 Encounter for adjustment and management of vascular access device: Secondary | ICD-10-CM | POA: Diagnosis not present

## 2014-08-12 DIAGNOSIS — E43 Unspecified severe protein-calorie malnutrition: Secondary | ICD-10-CM | POA: Diagnosis not present

## 2014-08-12 DIAGNOSIS — N390 Urinary tract infection, site not specified: Secondary | ICD-10-CM | POA: Diagnosis not present

## 2014-08-12 DIAGNOSIS — Z794 Long term (current) use of insulin: Secondary | ICD-10-CM | POA: Diagnosis not present

## 2014-08-12 DIAGNOSIS — E119 Type 2 diabetes mellitus without complications: Secondary | ICD-10-CM | POA: Diagnosis not present

## 2014-08-12 DIAGNOSIS — R197 Diarrhea, unspecified: Secondary | ICD-10-CM

## 2014-08-12 NOTE — Telephone Encounter (Signed)
Nurse from [facility] called to request Immodium for pt. Nurse states that the pt. Has had continuous diarrhea for the past 4 days. Nurse also states that the pt's blood pressure has increased. Please f/u with pt.

## 2014-08-12 NOTE — Telephone Encounter (Signed)
[facility] Nurse call Pt still with diarrhea

## 2014-08-17 ENCOUNTER — Ambulatory Visit: Payer: Medicare Other | Admitting: *Deleted

## 2014-08-17 DIAGNOSIS — E119 Type 2 diabetes mellitus without complications: Secondary | ICD-10-CM | POA: Diagnosis not present

## 2014-08-17 DIAGNOSIS — R197 Diarrhea, unspecified: Secondary | ICD-10-CM | POA: Insufficient documentation

## 2014-08-17 DIAGNOSIS — E43 Unspecified severe protein-calorie malnutrition: Secondary | ICD-10-CM | POA: Diagnosis not present

## 2014-08-17 DIAGNOSIS — N390 Urinary tract infection, site not specified: Secondary | ICD-10-CM | POA: Diagnosis not present

## 2014-08-17 DIAGNOSIS — Z452 Encounter for adjustment and management of vascular access device: Secondary | ICD-10-CM | POA: Diagnosis not present

## 2014-08-17 DIAGNOSIS — Z794 Long term (current) use of insulin: Secondary | ICD-10-CM | POA: Diagnosis not present

## 2014-08-17 MED ORDER — LOPERAMIDE HCL 2 MG PO CAPS: ORAL_CAPSULE | ORAL | Status: DC

## 2014-08-17 NOTE — Telephone Encounter (Signed)
Stella please call back to [facility] and [facility] 1. Lomotil per orders for diarrhea 2. What are patient's BP and pulse readings?

## 2014-08-17 NOTE — Addendum Note (Signed)
Addended by: Boykin Nearing on: 08/17/2014 01:47 PM   Modules accepted: Orders

## 2014-08-17 NOTE — Assessment & Plan Note (Signed)
A: persistent diarrhea with negative stool microbiology and discontinuation of metformin. P: imodium

## 2014-08-18 ENCOUNTER — Encounter: Payer: Self-pay | Admitting: Internal Medicine

## 2014-08-18 ENCOUNTER — Telehealth: Payer: Self-pay | Admitting: Family Medicine

## 2014-08-18 ENCOUNTER — Ambulatory Visit (INDEPENDENT_AMBULATORY_CARE_PROVIDER_SITE_OTHER): Payer: Medicare Other | Admitting: Internal Medicine

## 2014-08-18 ENCOUNTER — Telehealth: Payer: Self-pay | Admitting: *Deleted

## 2014-08-18 VITALS — BP 199/103 | HR 88 | Temp 97.3°F | Wt 105.0 lb

## 2014-08-18 DIAGNOSIS — B9561 Methicillin susceptible Staphylococcus aureus infection as the cause of diseases classified elsewhere: Secondary | ICD-10-CM | POA: Diagnosis not present

## 2014-08-18 DIAGNOSIS — R7881 Bacteremia: Secondary | ICD-10-CM | POA: Diagnosis not present

## 2014-08-18 MED ORDER — CEFAZOLIN SODIUM 1-5 GM-% IV SOLN: 1.0000 g | Freq: Three times a day (TID) | INTRAVENOUS | Status: AC

## 2014-08-18 NOTE — Telephone Encounter (Signed)
Per verbal from Dr Megan Salon called [facility] and advised to D/C IV Ancef 09/02/14 at which time they can also pull the PICC.

## 2014-08-18 NOTE — Progress Notes (Signed)
Patient ID: Brian Owen, male   DOB: 1951/07/11, 63 y.o.   MRN: [id number]         [facility]  Patient Active Problem List   Diagnosis Date Noted  . Diarrhea 08/17/2014  . Hypothermia 08/02/2014  . Protein-calorie malnutrition, severe 07/26/2014  . UTI (lower urinary tract infection) 07/22/2014  . Dizziness 07/22/2014  . History of tobacco use 06/27/2014  . Edentulism, complete 06/27/2014  . Language barrier to communication 06/27/2014  . History of hypertension 06/27/2014  . Gram-positive cocci bacteremia 05/15/2014  . Normocytic anemia 05/14/2014  . Hyponatremia 05/13/2014  . Diabetes mellitus type 2, uncontrolled 05/13/2014    Patient's Medications  New Prescriptions   No medications on file  Previous Medications   ACCU-CHEK FASTCLIX LANCETS MISC    1 each by Does not apply route 3 (three) times daily. ICD 10-E11.65   BLOOD GLUCOSE MONITORING SUPPL (ACCU-CHEK NANO SMARTVIEW) W/DEVICE KIT    1 Device by Does not apply route as needed. ICD 10-E11.65   GLUCOSE BLOOD (ACCU-CHEK SMARTVIEW) TEST STRIP    1 each by Other route 3 (three) times daily. ICD 10-E11.65   INSULIN DETEMIR (LEVEMIR) 100 UNIT/ML SOLN    Inject 0.25 mLs (25 Units total) into the skin at bedtime.   LOPERAMIDE (IMODIUM) 2 MG CAPSULE    4 mg once, then 2 mg after every loose stool, up to 16 mg daily.   NUTRITIONAL SUPPLEMENTS (ENSURE HIGH PROTEIN) POWD    Take 4 oz by mouth 2 (two) times daily between meals. 4 oz of powder mixed with 6 oz of water   SITAGLIPTIN (JANUVIA) 100 MG TABLET    Take 1 tablet (100 mg total) by mouth daily.  Modified Medications   Modified Medication Previous Medication   CEFAZOLIN (ANCEF) 1-5 GM-% ceFAZolin (ANCEF) 1-5 GM-%      Inject 50 mLs (1 g total) into the vein every 8 (eight) hours.    Inject 50 mLs (1 g total) into the vein every 8 (eight) hours.  Discontinued Medications   No medications on file    Subjective: Mr. [name] is in for his hospital  followup with his daughter and interpreter. He was hospitalized in July with bilateral thigh soft tissue infections and MSSA bacteremia. He was started on IV antibiotics. Repeat blood cultures done and 24 and 48 hours were negative. There was no evidence of endocarditis on transthoracic echocardiogram. He left AGAINST MEDICAL ADVICE and it is unclear to me if he took any antibiotic therapy after that admission. He was readmitted in late September with abdominal pain. He was started on empiric antibiotics for possible pyelonephritis. Blood cultures obtained after starting antibiotics were negative but his urine culture grew MSSA. Repeat transthoracic echocardiogram was negative again. He refused a TEE. He was discharged on IV cefazolin and has now completed 27 days of therapy. He has not had any problems tolerating cefazolin or his PICC. He has developed some diarrhea which he felt was due to [facility] which is sounds like was started recently. He stopped taking it about one week ago and says that his diarrhea is improving. He has not had any fever, chills, sweats, nausea or vomiting. Review of Systems: Pertinent items are noted in HPI.  Past Medical History  Diagnosis Date  . Diabetes mellitus without complication     History  Substance Use Topics  . Smoking status: Former Smoker    Types: Cigarettes  . Smokeless tobacco: Former Leisure centre manager  date: 03/29/2014  . Alcohol Use: No    No family history on file.  No Known Allergies  Objective: Temp: 97.3 F (36.3 C) (10/21 1344) Temp Source: Oral (10/21 1344) BP: 199/103 mmHg (10/21 1344) Pulse Rate: 88 (10/21 1344)  General: He is alert and in no distress Skin: Right arm PICC site appears normal Lungs: Clear Cor: Regular S1 and S2 with no murmurs Abdomen: Soft and nontender Extremities: Healed and soft tissue infections and bilateral thighs   Assessment: He is improving on therapy for recent staph bacteremia complicated by soft tissue  infection and pyelonephritis. He is daughter feel that they are willing and able to complete a full 6 weeks of therapy.  Plan: 1. Continue IV cefazolin through November 5 then stop and have a PICC removed 2. I asked his daughter to call the [facility] and let them know that he is no longer taking Januvia 3. Followup here in one month   Michel Bickers, MD [facility] for [place] [phone] pager   [phone] cell 08/18/2014, 2:21 PM

## 2014-08-18 NOTE — Telephone Encounter (Signed)
Nurse from [facility] called stating that pt's blood sugar levels have been fluctuating from 100-400, getting higher at bedtime, nurse would like to know what to do next. Please f/u.

## 2014-08-23 NOTE — Telephone Encounter (Signed)
Spoke with [place] regarding pt uncontrolled blood sugars. Nira Conn states she will do a home visit with him tomorrow, and f/u after CBG testing. Nurse line number given if we need to schedule visit

## 2014-08-24 ENCOUNTER — Telehealth: Payer: Self-pay | Admitting: Emergency Medicine

## 2014-08-24 DIAGNOSIS — E119 Type 2 diabetes mellitus without complications: Secondary | ICD-10-CM | POA: Diagnosis not present

## 2014-08-24 DIAGNOSIS — E43 Unspecified severe protein-calorie malnutrition: Secondary | ICD-10-CM | POA: Diagnosis not present

## 2014-08-24 DIAGNOSIS — N390 Urinary tract infection, site not specified: Secondary | ICD-10-CM | POA: Diagnosis not present

## 2014-08-24 DIAGNOSIS — E1165 Type 2 diabetes mellitus with hyperglycemia: Secondary | ICD-10-CM

## 2014-08-24 DIAGNOSIS — Z794 Long term (current) use of insulin: Secondary | ICD-10-CM | POA: Diagnosis not present

## 2014-08-24 DIAGNOSIS — IMO0002 Reserved for concepts with insufficient information to code with codable children: Secondary | ICD-10-CM

## 2014-08-24 DIAGNOSIS — Z452 Encounter for adjustment and management of vascular access device: Secondary | ICD-10-CM | POA: Diagnosis not present

## 2014-08-24 NOTE — Addendum Note (Signed)
Addended by: Boykin Nearing on: 08/24/2014 01:44 PM   Modules accepted: Orders

## 2014-08-24 NOTE — Telephone Encounter (Signed)
Noted. Ordered diabetes education. Be aware patient was asymptomatically hypoglycemic in office last visit. Very high risk.  Patient may continue off januvia (removed from active med list), he also did not tolerate metformin.  All the adjunctive therapies: GLP 1, DDP-4, SGLT have diarrhea as a possible side effect and should be avoided in him for now while he is still on IV Abx.   So, I recommend a change in his insulin to humulin 70/30 15 U BID.  With close f/u and teaching about hypoglycemia.

## 2014-08-24 NOTE — Telephone Encounter (Signed)
[facility] nurse called in today regarding pt CBg high reading during home visits. States blood sugars has been running in the high 200-400's with diarrhea Pt was recently started on Januvia due to freq diarrhea while taking Metformin. Wife states he stopped taking Januvia 2 weeks ago because he continues to have diarrhea. He is only taking Levemir 25 units at bedtime. Pt also is on home IV Antibiotic therapy until 09/05/14 which may be the cause of diarrhea. I scheduled the patient for nurse visit this Friday to check CBG, Education and possible change of oral medication.

## 2014-08-27 ENCOUNTER — Ambulatory Visit: Payer: Medicare Other | Attending: Family Medicine

## 2014-08-27 DIAGNOSIS — R739 Hyperglycemia, unspecified: Secondary | ICD-10-CM | POA: Diagnosis not present

## 2014-08-27 LAB — POCT CBG (FASTING - GLUCOSE)-MANUAL ENTRY: Glucose Fasting, POC: 91 mg/dL (ref 70–99)

## 2014-08-27 MED ORDER — INSULIN NPH ISOPHANE & REGULAR (70-30) 100 UNIT/ML ~~LOC~~ SUSP: 15.0000 [IU] | Freq: Two times a day (BID) | SUBCUTANEOUS | Status: DC

## 2014-08-27 NOTE — Progress Notes (Unsigned)
Pt here for Nurses visit CBG recheck Pt stated is fasting  CBG 91

## 2014-08-27 NOTE — Patient Instructions (Signed)
Janumet discontinued from chart Start  taking prescribed Humulin 70/30 inject 20 units twice a day with meals Return in 2 weeks for nurse visit to check CBg

## 2014-08-27 NOTE — Progress Notes (Unsigned)
Pt comes in for CBg recheck and possible insulin adjustment due to poorly controlled blood sugars during home care visit last week After notation from Dr. Adrian Blackwater, we will stop Janumet and start him on Humulin 70/30 to inject 20 units BID

## 2014-09-02 ENCOUNTER — Encounter (HOSPITAL_COMMUNITY): Payer: Self-pay | Admitting: *Deleted

## 2014-09-02 ENCOUNTER — Emergency Department (HOSPITAL_COMMUNITY)
Admission: EM | Admit: 2014-09-02 | Discharge: 2014-09-02 | Disposition: A | Discharge status: Home / Self Care | Payer: Medicare Other | Attending: Emergency Medicine | Admitting: Emergency Medicine

## 2014-09-02 DIAGNOSIS — Z87891 Personal history of nicotine dependence: Secondary | ICD-10-CM | POA: Insufficient documentation

## 2014-09-02 DIAGNOSIS — E162 Hypoglycemia, unspecified: Secondary | ICD-10-CM

## 2014-09-02 DIAGNOSIS — Z79899 Other long term (current) drug therapy: Secondary | ICD-10-CM | POA: Insufficient documentation

## 2014-09-02 DIAGNOSIS — E11649 Type 2 diabetes mellitus with hypoglycemia without coma: Secondary | ICD-10-CM | POA: Insufficient documentation

## 2014-09-02 DIAGNOSIS — Z794 Long term (current) use of insulin: Secondary | ICD-10-CM | POA: Insufficient documentation

## 2014-09-02 LAB — CBC WITH DIFFERENTIAL/PLATELET
Basophils Absolute: 0 10*3/uL (ref 0.0–0.1)
Basophils Relative: 0 % (ref 0–1)
EOS ABS: 0.1 10*3/uL (ref 0.0–0.7)
Eosinophils Relative: 2 % (ref 0–5)
HCT: 35.4 % — ABNORMAL LOW (ref 39.0–52.0)
Hemoglobin: 11.6 g/dL — ABNORMAL LOW (ref 13.0–17.0)
Lymphocytes Relative: 20 % (ref 12–46)
Lymphs Abs: 0.9 10*3/uL (ref 0.7–4.0)
MCH: 30.1 pg (ref 26.0–34.0)
MCHC: 32.8 g/dL (ref 30.0–36.0)
MCV: 91.9 fL (ref 78.0–100.0)
MONOS PCT: 8 % (ref 3–12)
Monocytes Absolute: 0.4 10*3/uL (ref 0.1–1.0)
Neutro Abs: 3.2 10*3/uL (ref 1.7–7.7)
Neutrophils Relative %: 70 % (ref 43–77)
Platelets: 178 10*3/uL (ref 150–400)
RBC: 3.85 MIL/uL — ABNORMAL LOW (ref 4.22–5.81)
RDW: 15.4 % (ref 11.5–15.5)
WBC: 4.6 10*3/uL (ref 4.0–10.5)

## 2014-09-02 LAB — BASIC METABOLIC PANEL
Anion gap: 11 (ref 5–15)
BUN: 15 mg/dL (ref 6–23)
CO2: 24 meq/L (ref 19–32)
Calcium: 9.9 mg/dL (ref 8.4–10.5)
Chloride: 102 mEq/L (ref 96–112)
Creatinine, Ser: 0.87 mg/dL (ref 0.50–1.35)
GFR calc Af Amer: >90 mL/min (ref >90)
GFR, EST NON AFRICAN AMERICAN: 90 mL/min — AB (ref >90)
GLUCOSE: 133 mg/dL — AB (ref 70–99)
POTASSIUM: 5.2 meq/L (ref 3.7–5.3)
Sodium: 137 mEq/L (ref 137–147)

## 2014-09-02 LAB — URINALYSIS, ROUTINE W REFLEX MICROSCOPIC
Bilirubin Urine: NEGATIVE
GLUCOSE, UA: NEGATIVE mg/dL
Ketones, ur: NEGATIVE mg/dL
Leukocytes, UA: NEGATIVE
Nitrite: NEGATIVE
PH: 5.5 (ref 5.0–8.0)
PROTEIN: NEGATIVE mg/dL
Specific Gravity, Urine: 1.01 (ref 1.005–1.030)
Urobilinogen, UA: 0.2 mg/dL (ref 0.0–1.0)

## 2014-09-02 LAB — CBG MONITORING, ED
GLUCOSE-CAPILLARY: 116 mg/dL — AB (ref 70–99)
GLUCOSE-CAPILLARY: 84 mg/dL (ref 70–99)
GLUCOSE-CAPILLARY: 93 mg/dL (ref 70–99)
GLUCOSE-CAPILLARY: 171 mg/dL — AB (ref 70–99)
Glucose-Capillary: 133 mg/dL — ABNORMAL HIGH (ref 70–99)
Glucose-Capillary: 111 mg/dL — ABNORMAL HIGH (ref 70–99)
Glucose-Capillary: 143 mg/dL — ABNORMAL HIGH (ref 70–99)
Glucose-Capillary: 109 mg/dL — ABNORMAL HIGH (ref 70–99)

## 2014-09-02 LAB — URINE MICROSCOPIC-ADD ON

## 2014-09-02 NOTE — ED Notes (Signed)
Attempted to call pt's daughter at number she provided this morning, number is disconnected. Also called number for pt's emergency contact, no answer.

## 2014-09-02 NOTE — ED Provider Notes (Signed)
CSN: [id number]     Arrival date & time 09/02/14  0759 History   First MD Initiated Contact with Patient 09/02/14 0756     Chief Complaint  Patient presents with  . Hypoglycemia     (Consider location/radiation/quality/duration/timing/severity/associated sxs/prior Treatment) Patient is a 63 y.o. male presenting with hypoglycemia.  Hypoglycemia Initial blood sugar:  32 Blood sugar after intervention:  192 Severity:  Severe Onset quality:  Sudden Timing:  Constant Progression:  Resolved Chronicity:  Recurrent Diabetic status:  Controlled with insulin Current diabetic therapy:  Lantus 25 units at bedtime.  He was prescribed a new insulin, but can't afford it.  Context: decreased oral intake (reports not feeling hungry yesterday )   Relieved by: D50. Associated symptoms: altered mental status (Pt reportedly confused this morning at about 5am.  Trying to leave the house.  Yelling "like a baby".)   Associated symptoms: no vomiting     Past Medical History  Diagnosis Date  . Diabetes mellitus without complication    Past Surgical History  Procedure Laterality Date  . No past surgeries     History reviewed. No pertinent family history. History  Substance Use Topics  . Smoking status: Former Smoker    Types: Cigarettes  . Smokeless tobacco: Former Systems developer    Quit date: 03/29/2014  . Alcohol Use: No    Review of Systems  Gastrointestinal: Negative for vomiting.  All other systems reviewed and are negative.     Allergies  Review of patient's allergies indicates no known allergies.  Home Medications   Prior to Admission medications   Medication Sig Start Date End Date Taking? Authorizing Provider  ACCU-CHEK FASTCLIX LANCETS MISC 1 each by Does not apply route 3 (three) times daily. ICD 10-E11.65 08/02/14   [name], MD  Blood Glucose Monitoring Suppl (ACCU-CHEK NANO SMARTVIEW) W/DEVICE KIT 1 Device by Does not apply route as needed. ICD 10-E11.65 08/02/14   [name], MD  ceFAZolin (ANCEF) 1-5 GM-% Inject 50 mLs (1 g total) into the vein every 8 (eight) hours. 08/18/14 09/02/14  Michel Bickers, MD  glucose blood (ACCU-CHEK SMARTVIEW) test strip 1 each by Other route 3 (three) times daily. ICD 10-E11.65 08/02/14   [name], MD  insulin NPH-regular Human (NOVOLIN 70/30) (70-30) 100 UNIT/ML injection Inject 15 Units into the skin 2 (two) times daily with a meal. 08/27/14   Josalyn C [name], MD  loperamide (IMODIUM) 2 MG capsule 4 mg once, then 2 mg after every loose stool, up to 16 mg daily. 08/17/14   [name], MD  Nutritional Supplements (ENSURE HIGH PROTEIN) POWD Take 4 oz by mouth 2 (two) times daily between meals. 4 oz of powder mixed with 6 oz of water 08/02/14   Josalyn C [name], MD   BP 162/86 mmHg  Pulse 82  Resp 14  Wt 120 lb (54.432 kg)  SpO2 100% Physical Exam  Constitutional: He is oriented to person, place, and time. He appears well-developed. No distress.  Cachectic  HENT:  Head: Normocephalic and atraumatic.  Mouth/Throat: Oropharynx is clear and moist.  Eyes: Conjunctivae are normal. Pupils are equal, round, and reactive to light. No scleral icterus.  Neck: Neck supple.  Cardiovascular: Normal rate, regular rhythm, normal heart sounds and intact distal pulses.   No murmur heard. Pulmonary/Chest: Effort normal and breath sounds normal. No stridor. No respiratory distress. He has no wheezes. He has no rales.  Abdominal: Soft. He exhibits no distension. There is no tenderness.  Musculoskeletal: Normal range of motion. He exhibits no edema.  PICC line right arm, well appearing  Neurological: He is alert and oriented to person, place, and time.  Skin: Skin is warm and dry. No rash noted.  Psychiatric: He has a normal mood and affect. His behavior is normal.  Nursing note and vitals reviewed.   ED Course  Procedures (including critical care time) Labs Review Labs Reviewed  CBC WITH DIFFERENTIAL - Abnormal;  Notable for the following:    RBC 3.85 (*)    Hemoglobin 11.6 (*)    HCT 35.4 (*)    All other components within normal limits  BASIC METABOLIC PANEL - Abnormal; Notable for the following:    Glucose, Bld 133 (*)    GFR calc non Af Amer 90 (*)    All other components within normal limits  URINALYSIS, ROUTINE W REFLEX MICROSCOPIC - Abnormal; Notable for the following:    Hgb urine dipstick TRACE (*)    All other components within normal limits  CBG MONITORING, ED - Abnormal; Notable for the following:    Glucose-Capillary 116 (*)    All other components within normal limits  CBG MONITORING, ED - Abnormal; Notable for the following:    Glucose-Capillary 111 (*)    All other components within normal limits  CBG MONITORING, ED - Abnormal; Notable for the following:    Glucose-Capillary 133 (*)    All other components within normal limits  CBG MONITORING, ED - Abnormal; Notable for the following:    Glucose-Capillary 171 (*)    All other components within normal limits  CBG MONITORING, ED - Abnormal; Notable for the following:    Glucose-Capillary 143 (*)    All other components within normal limits  CBG MONITORING, ED - Abnormal; Notable for the following:    Glucose-Capillary 109 (*)    All other components within normal limits  URINE MICROSCOPIC-ADD ON  CBG MONITORING, ED  CBG MONITORING, ED  CBG MONITORING, ED    Imaging Review No results found.   EKG Interpretation None      MDM   Final diagnoses:  Hypoglycemia    63 yo male with hypoglycemia and AMS.  He had a blood sugar of 77 last night, was given 25 units of Lantus, then became altered this morning.  Baseline now after D50 by EMS.  Will check labs, feed, repeat blood sugar.  Have given pt and family education regarding diet and insulin.    Observed in ED for several hours.  He tolerated PO intake.  His blood sugars remained normal to slightly elevated after multiple rechecks.    Artis Delay, MD 09/02/14  [phone]

## 2014-09-02 NOTE — ED Notes (Signed)
Bed: [id number] Expected date:  Expected time:  Means of arrival:  Comments: AMS, hypoglycemic, they fixed him

## 2014-09-02 NOTE — Discharge Instructions (Signed)
Hypoglycemia °Hypoglycemia occurs when the glucose in your blood is too low. Glucose is a type of sugar that is your body's main energy source. Hormones, such as insulin and glucagon, control the level of glucose in the blood. Insulin lowers blood glucose and glucagon increases blood glucose. Having too much insulin in your blood stream, or not eating enough food containing sugar, can result in hypoglycemia. Hypoglycemia can happen to people with or without diabetes. It can develop quickly and can be a medical emergency.  °CAUSES  °· Missing or delaying meals. °· Not eating enough carbohydrates at meals. °· Taking too much diabetes medicine. °· Not timing your oral diabetes medicine or insulin doses with meals, snacks, and exercise. °· Nausea and vomiting. °· Certain medicines. °· Severe illnesses, such as hepatitis, kidney disorders, and certain eating disorders. °· Increased activity or exercise without eating something extra or adjusting medicines. °· Drinking too much alcohol. °· A nerve disorder that affects body functions like your heart rate, blood pressure, and digestion (autonomic neuropathy). °· A condition where the stomach muscles do not function properly (gastroparesis). Therefore, medicines and food may not absorb properly. °· Rarely, a tumor of the pancreas can produce too much insulin. °SYMPTOMS  °· Hunger. °· Sweating (diaphoresis). °· Change in body temperature. °· Shakiness. °· Headache. °· Anxiety. °· Lightheadedness. °· Irritability. °· Difficulty concentrating. °· Dry mouth. °· Tingling or numbness in the hands or feet. °· Restless sleep or sleep disturbances. °· Altered speech and coordination. °· Change in mental status. °· Seizures or prolonged convulsions. °· Combativeness. °· Drowsiness (lethargic). °· Weakness. °· Increased heart rate or palpitations. °· Confusion. °· Pale, gray skin color. °· Blurred or double vision. °· Fainting. °DIAGNOSIS  °A physical exam and medical history will be  performed. Your caregiver may make a diagnosis based on your symptoms. Blood tests and other lab tests may be performed to confirm a diagnosis. Once the diagnosis is made, your caregiver will see if your signs and symptoms go away once your blood glucose is raised.  °TREATMENT  °Usually, you can easily treat your hypoglycemia when you notice symptoms. °· Check your blood glucose. If it is less than 70 mg/dl, take one of the following:   °¨ 3-4 glucose tablets.   °¨ ½ cup juice.   °¨ ½ cup regular soda.   °¨ 1 cup skim milk.   °¨ ½-1 tube of glucose gel.   °¨ 5-6 hard candies.   °· Avoid high-fat drinks or food that may delay a rise in blood glucose levels. °· Do not take more than the recommended amount of sugary foods, drinks, gel, or tablets. Doing so will cause your blood glucose to go too high.   °· Wait 10-15 minutes and recheck your blood glucose. If it is still less than 70 mg/dl or below your target range, repeat treatment.   °· Eat a snack if it is more than 1 hour until your next meal.   °There may be a time when your blood glucose may go so low that you are unable to treat yourself at home when you start to notice symptoms. You may need someone to help you. You may even faint or be unable to swallow. If you cannot treat yourself, someone will need to bring you to the hospital.  °HOME CARE INSTRUCTIONS °· If you have diabetes, follow your diabetes management plan by: °¨ Taking your medicines as directed. °¨ Following your exercise plan. °¨ Following your meal plan. Do not skip meals. Eat on time. °¨ Testing your blood   glucose regularly. Check your blood glucose before and after exercise. If you exercise longer or different than usual, be sure to check blood glucose more frequently. °¨ Wearing your medical alert jewelry that says you have diabetes. °· Identify the cause of your hypoglycemia. Then, develop ways to prevent the recurrence of hypoglycemia. °· Do not take a hot bath or shower right after an  insulin shot. °· Always carry treatment with you. Glucose tablets are the easiest to carry. °· If you are going to drink alcohol, drink it only with meals. °· Tell friends or family members ways to keep you safe during a seizure. This may include removing hard or sharp objects from the area or turning you on your side. °· Maintain a healthy weight. °SEEK MEDICAL CARE IF:  °· You are having problems keeping your blood glucose in your target range. °· You are having frequent episodes of hypoglycemia. °· You feel you might be having side effects from your medicines. °· You are not sure why your blood glucose is dropping so low. °· You notice a change in vision or a new problem with your vision. °SEEK IMMEDIATE MEDICAL CARE IF:  °· Confusion develops. °· A change in mental status occurs. °· The inability to swallow develops. °· Fainting occurs. °Document Released: 10/15/2005 Document Revised: 10/20/2013 Document Reviewed: 02/11/2012 °[employer]® Patient Information ©2015 [employer]. This information is not intended to replace advice given to you by your health care provider. Make sure you discuss any questions you have with your health care provider. ° °

## 2014-09-02 NOTE — ED Notes (Signed)
Per EMS pt coming from home with c/o altered mental status since 5:00 am. Upon their arrival EMS sts pt's CBG were 32. Per EMS family reports pt's CBG last night was 77, after which they gave him insulin. EMS administered D50 and last cbg's were 192

## 2014-09-02 NOTE — ED Notes (Signed)
Pt given turkey sandwich and orange juice

## 2014-09-03 ENCOUNTER — Telehealth: Payer: Self-pay | Admitting: Family Medicine

## 2014-09-03 DIAGNOSIS — E43 Unspecified severe protein-calorie malnutrition: Secondary | ICD-10-CM | POA: Diagnosis not present

## 2014-09-03 DIAGNOSIS — Z452 Encounter for adjustment and management of vascular access device: Secondary | ICD-10-CM | POA: Diagnosis not present

## 2014-09-03 DIAGNOSIS — Z794 Long term (current) use of insulin: Secondary | ICD-10-CM | POA: Diagnosis not present

## 2014-09-03 DIAGNOSIS — N390 Urinary tract infection, site not specified: Secondary | ICD-10-CM | POA: Diagnosis not present

## 2014-09-03 DIAGNOSIS — E119 Type 2 diabetes mellitus without complications: Secondary | ICD-10-CM | POA: Diagnosis not present

## 2014-09-03 NOTE — Telephone Encounter (Signed)
Nurse from [place] would like to speak to a nurse about patients medications. Please f/u with nurse.

## 2014-09-06 DIAGNOSIS — Z452 Encounter for adjustment and management of vascular access device: Secondary | ICD-10-CM | POA: Diagnosis not present

## 2014-09-06 DIAGNOSIS — E119 Type 2 diabetes mellitus without complications: Secondary | ICD-10-CM | POA: Diagnosis not present

## 2014-09-06 DIAGNOSIS — Z794 Long term (current) use of insulin: Secondary | ICD-10-CM | POA: Diagnosis not present

## 2014-09-06 DIAGNOSIS — E43 Unspecified severe protein-calorie malnutrition: Secondary | ICD-10-CM | POA: Diagnosis not present

## 2014-09-06 DIAGNOSIS — N390 Urinary tract infection, site not specified: Secondary | ICD-10-CM | POA: Diagnosis not present

## 2014-09-07 NOTE — Telephone Encounter (Signed)
Left message with [place] regarding pt medications

## 2014-09-10 ENCOUNTER — Emergency Department (HOSPITAL_COMMUNITY): Payer: Medicare Other

## 2014-09-10 ENCOUNTER — Other Ambulatory Visit: Payer: Medicare Other

## 2014-09-10 ENCOUNTER — Telehealth: Payer: Self-pay | Admitting: Family Medicine

## 2014-09-10 ENCOUNTER — Inpatient Hospital Stay (HOSPITAL_COMMUNITY)
Admission: EM | Admit: 2014-09-10 | Discharge: 2014-09-12 | DRG: 871 | Disposition: A | Discharge status: Home / Self Care | Payer: Medicare Other | Attending: Family Medicine | Admitting: Family Medicine

## 2014-09-10 ENCOUNTER — Encounter (HOSPITAL_COMMUNITY): Payer: Self-pay | Admitting: Emergency Medicine

## 2014-09-10 DIAGNOSIS — E1165 Type 2 diabetes mellitus with hyperglycemia: Secondary | ICD-10-CM | POA: Diagnosis present

## 2014-09-10 DIAGNOSIS — I10 Essential (primary) hypertension: Secondary | ICD-10-CM

## 2014-09-10 DIAGNOSIS — E11649 Type 2 diabetes mellitus with hypoglycemia without coma: Secondary | ICD-10-CM | POA: Diagnosis present

## 2014-09-10 DIAGNOSIS — T68XXXA Hypothermia, initial encounter: Secondary | ICD-10-CM | POA: Diagnosis present

## 2014-09-10 DIAGNOSIS — E43 Unspecified severe protein-calorie malnutrition: Secondary | ICD-10-CM | POA: Diagnosis present

## 2014-09-10 DIAGNOSIS — Z8679 Personal history of other diseases of the circulatory system: Secondary | ICD-10-CM | POA: Diagnosis not present

## 2014-09-10 DIAGNOSIS — Z794 Long term (current) use of insulin: Secondary | ICD-10-CM | POA: Diagnosis present

## 2014-09-10 DIAGNOSIS — E872 Acidosis, unspecified: Secondary | ICD-10-CM

## 2014-09-10 DIAGNOSIS — Z6822 Body mass index (BMI) 22.0-22.9, adult: Secondary | ICD-10-CM

## 2014-09-10 DIAGNOSIS — Z87891 Personal history of nicotine dependence: Secondary | ICD-10-CM | POA: Diagnosis not present

## 2014-09-10 DIAGNOSIS — A419 Sepsis, unspecified organism: Secondary | ICD-10-CM | POA: Diagnosis present

## 2014-09-10 DIAGNOSIS — R5383 Other fatigue: Secondary | ICD-10-CM | POA: Diagnosis not present

## 2014-09-10 DIAGNOSIS — E162 Hypoglycemia, unspecified: Secondary | ICD-10-CM

## 2014-09-10 DIAGNOSIS — R6510 Systemic inflammatory response syndrome (SIRS) of non-infectious origin without acute organ dysfunction: Secondary | ICD-10-CM | POA: Diagnosis not present

## 2014-09-10 LAB — URINALYSIS, ROUTINE W REFLEX MICROSCOPIC
Bilirubin Urine: NEGATIVE
GLUCOSE, UA: NEGATIVE mg/dL
Ketones, ur: NEGATIVE mg/dL
Leukocytes, UA: NEGATIVE
Nitrite: NEGATIVE
Protein, ur: NEGATIVE mg/dL
SPECIFIC GRAVITY, URINE: 1.008 (ref 1.005–1.030)
Urobilinogen, UA: 0.2 mg/dL (ref 0.0–1.0)
pH: 5.5 (ref 5.0–8.0)

## 2014-09-10 LAB — GLUCOSE, CAPILLARY
GLUCOSE-CAPILLARY: 95 mg/dL (ref 70–99)
GLUCOSE-CAPILLARY: 108 mg/dL — AB (ref 70–99)

## 2014-09-10 LAB — CBC WITH DIFFERENTIAL/PLATELET
Basophils Absolute: 0 10*3/uL (ref 0.0–0.1)
Basophils Relative: 0 % (ref 0–1)
Eosinophils Absolute: 0 10*3/uL (ref 0.0–0.7)
Eosinophils Relative: 0 % (ref 0–5)
HCT: 37 % — ABNORMAL LOW (ref 39.0–52.0)
HEMOGLOBIN: 12 g/dL — AB (ref 13.0–17.0)
LYMPHS ABS: 1.1 10*3/uL (ref 0.7–4.0)
Lymphocytes Relative: 14 % (ref 12–46)
MCH: 30 pg (ref 26.0–34.0)
MCHC: 32.4 g/dL (ref 30.0–36.0)
MCV: 92.5 fL (ref 78.0–100.0)
MONOS PCT: 3 % (ref 3–12)
Monocytes Absolute: 0.2 10*3/uL (ref 0.1–1.0)
NEUTROS ABS: 6.7 10*3/uL (ref 1.7–7.7)
NEUTROS PCT: 83 % — AB (ref 43–77)
Platelets: 242 10*3/uL (ref 150–400)
RBC: 4 MIL/uL — AB (ref 4.22–5.81)
RDW: 14.8 % (ref 11.5–15.5)
WBC: 8.1 10*3/uL (ref 4.0–10.5)

## 2014-09-10 LAB — BASIC METABOLIC PANEL
Anion gap: 24 — ABNORMAL HIGH (ref 5–15)
BUN: 17 mg/dL (ref 6–23)
CALCIUM: 9.4 mg/dL (ref 8.4–10.5)
CO2: 17 meq/L — AB (ref 19–32)
CREATININE: 0.96 mg/dL (ref 0.50–1.35)
Chloride: 101 mEq/L (ref 96–112)
GFR calc Af Amer: >90 mL/min (ref >90)
GFR, EST NON AFRICAN AMERICAN: 86 mL/min — AB (ref >90)
GLUCOSE: 210 mg/dL — AB (ref 70–99)
Potassium: 4.1 mEq/L (ref 3.7–5.3)
Sodium: 142 mEq/L (ref 137–147)

## 2014-09-10 LAB — I-STAT CG4 LACTIC ACID, ED
LACTIC ACID, VENOUS: 9.92 mmol/L — AB (ref 0.5–2.2)
LACTIC ACID, VENOUS: 2.06 mmol/L (ref 0.5–2.2)

## 2014-09-10 LAB — MRSA PCR SCREENING: MRSA by PCR: NEGATIVE

## 2014-09-10 LAB — URINE MICROSCOPIC-ADD ON

## 2014-09-10 LAB — CBG MONITORING, ED: Glucose-Capillary: 159 mg/dL — ABNORMAL HIGH (ref 70–99)

## 2014-09-10 LAB — LACTIC ACID, PLASMA: LACTIC ACID, VENOUS: 6 mmol/L — AB (ref 0.5–2.2)

## 2014-09-10 MED ORDER — SODIUM CHLORIDE 0.9 % IV SOLN
1000.0000 mL | INTRAVENOUS | Status: DC
  Administered 2014-09-10: 1000 mL via INTRAVENOUS

## 2014-09-10 MED ORDER — PIPERACILLIN-TAZOBACTAM 3.375 G IVPB: 3.3750 g | Freq: Three times a day (TID) | INTRAVENOUS | Status: DC

## 2014-09-10 MED ORDER — PIPERACILLIN-TAZOBACTAM 3.375 G IVPB
3.3750 g | Freq: Three times a day (TID) | INTRAVENOUS | Status: DC
  Administered 2014-09-10 – 2014-09-11 (×3): 3.375 g via INTRAVENOUS
  Filled 2014-09-10 (×4): qty 50

## 2014-09-10 MED ORDER — ENSURE HIGH PROTEIN PO POWD: 4.0000 [oz_av] | Freq: Two times a day (BID) | ORAL | Status: DC

## 2014-09-10 MED ORDER — PIPERACILLIN-TAZOBACTAM 3.375 G IVPB 30 MIN
3.3750 g | Freq: Once | INTRAVENOUS | Status: AC
Start: 2014-09-10 — End: 2014-09-10
  Administered 2014-09-10: 3.375 g via INTRAVENOUS
  Filled 2014-09-10: qty 50

## 2014-09-10 MED ORDER — ONDANSETRON HCL 4 MG/2ML IJ SOLN: 4.0000 mg | Freq: Four times a day (QID) | INTRAMUSCULAR | Status: DC | PRN

## 2014-09-10 MED ORDER — SODIUM CHLORIDE 0.9 % IV SOLN
INTRAVENOUS | Status: DC
  Administered 2014-09-10 (×2): via INTRAVENOUS

## 2014-09-10 MED ORDER — INSULIN ASPART 100 UNIT/ML ~~LOC~~ SOLN
0.0000 [IU] | Freq: Three times a day (TID) | SUBCUTANEOUS | Status: DC
  Administered 2014-09-11: 3 [IU] via SUBCUTANEOUS
  Administered 2014-09-11: 8 [IU] via SUBCUTANEOUS
  Administered 2014-09-11: 3 [IU] via SUBCUTANEOUS
  Administered 2014-09-12: 8 [IU] via SUBCUTANEOUS
  Administered 2014-09-12: 3 [IU] via SUBCUTANEOUS

## 2014-09-10 MED ORDER — SODIUM CHLORIDE 0.9 % IV BOLUS (SEPSIS)
1000.0000 mL | INTRAVENOUS | Status: DC
  Administered 2014-09-10: 1000 mL via INTRAVENOUS

## 2014-09-10 MED ORDER — VANCOMYCIN HCL IN DEXTROSE 750-5 MG/150ML-% IV SOLN
750.0000 mg | Freq: Two times a day (BID) | INTRAVENOUS | Status: DC
  Administered 2014-09-10 – 2014-09-11 (×2): 750 mg via INTRAVENOUS
  Filled 2014-09-10 (×2): qty 150

## 2014-09-10 MED ORDER — HEPARIN SODIUM (PORCINE) 5000 UNIT/ML IJ SOLN
5000.0000 [IU] | Freq: Three times a day (TID) | INTRAMUSCULAR | Status: DC
  Administered 2014-09-10 – 2014-09-12 (×6): 5000 [IU] via SUBCUTANEOUS
  Filled 2014-09-10 (×9): qty 1

## 2014-09-10 MED ORDER — SODIUM CHLORIDE 0.9 % IV BOLUS (SEPSIS)
1000.0000 mL | Freq: Once | INTRAVENOUS | Status: AC
  Administered 2014-09-10: 1000 mL via INTRAVENOUS

## 2014-09-10 MED ORDER — ENSURE COMPLETE PO LIQD
120.0000 mL | Freq: Two times a day (BID) | ORAL | Status: DC
  Administered 2014-09-10 – 2014-09-12 (×5): 120 mL via ORAL

## 2014-09-10 MED ORDER — SODIUM CHLORIDE 0.9 % IJ SOLN
3.0000 mL | Freq: Two times a day (BID) | INTRAMUSCULAR | Status: DC
  Administered 2014-09-10 – 2014-09-11 (×2): 3 mL via INTRAVENOUS

## 2014-09-10 MED ORDER — VANCOMYCIN HCL IN DEXTROSE 1-5 GM/200ML-% IV SOLN
1000.0000 mg | Freq: Once | INTRAVENOUS | Status: AC
  Administered 2014-09-10: 1000 mg via INTRAVENOUS
  Filled 2014-09-10: qty 200

## 2014-09-10 MED ORDER — ONDANSETRON HCL 4 MG PO TABS: 4.0000 mg | ORAL_TABLET | Freq: Four times a day (QID) | ORAL | Status: DC | PRN

## 2014-09-10 MED ORDER — VANCOMYCIN HCL IN DEXTROSE 750-5 MG/150ML-% IV SOLN
750.0000 mg | Freq: Two times a day (BID) | INTRAVENOUS | Status: DC
  Filled 2014-09-10: qty 150

## 2014-09-10 NOTE — H&P (Signed)
[facility] History and Physical  Brian Owen:[id number] DOB: 23-Dec-1950 DOA: 09/10/2014  Referring physician/ED personnel: Al Corpus PCP: [name], MD   Chief Complaint: Difficulty arousing   HPI: Brian Owen is a 63 y.o. male  With history of DM, h/o protein calorie malnutrition, history of poor compliance taking medications, recent MSSA bacteremia s/p 6 wks of IV antibiotics Cefazolin with last dose on 09/02/14 per last ID office note on 08/18/14. Family not present to confirm whether or not patient completed full treatment course.  Patient presents today after hypoglycemic episode.  Obtaining history from patient is difficult due to his limited ability to speak english although he is responding appropriately to questions.  He states that this morning his wife had difficulty awakening him and called 911. While in the ED patient was found to have hypoglycemia of 22, hypothermic, and lactic acidosis.    As a result we were consulted to admit patient for further evaluation and recommendations.   Review of Systems:  Limited ability to obtain full ROS due to language barrier. Pt denies currently any fevers, pain, chills, sob.  Past Medical History  Diagnosis Date  . Diabetes mellitus without complication    Past Surgical History  Procedure Laterality Date  . No past surgeries     Social History:  reports that he has quit smoking. His smoking use included Cigarettes. He smoked 0.00 packs per day. He quit smokeless tobacco use about 5 months ago. He reports that he does not drink alcohol or use illicit drugs.  No Known Allergies  Family history - when asked directly patient denied any family history of any diseases  Prior to Admission medications   Medication Sig Start Date End Date Taking? Authorizing Provider  insulin NPH-regular Human (NOVOLIN 70/30) (70-30) 100 UNIT/ML injection Inject 15 Units into the skin 2 (two) times daily with a meal. 08/27/14  Yes  Josalyn C [name], MD  loperamide (IMODIUM) 2 MG capsule 4 mg once, then 2 mg after every loose stool, up to 16 mg daily. 08/17/14  Yes Josalyn C [name], MD  Nutritional Supplements (ENSURE HIGH PROTEIN) POWD Take 4 oz by mouth 2 (two) times daily between meals. 4 oz of powder mixed with 6 oz of water 08/02/14  Yes [name], MD   Physical Exam: Filed Vitals:   09/10/14 1030 09/10/14 1045 09/10/14 1054 09/10/14 1100  BP: 131/65 148/82  133/79  Pulse: 95 100 90 92  Temp:      TempSrc:      Resp: 25 28 22 23   Weight:      SpO2: 97% 100% 100% 100%    Wt Readings from Last 3 Encounters:  09/10/14 54.432 kg (120 lb)  09/02/14 54.432 kg (120 lb)  08/18/14 47.628 kg (105 lb)    General:  Appears calm and comfortable Eyes: PERRL, normal lids, irises & conjunctiva ENT: grossly normal hearing, lips & tongue Neck: no LAD, masses or thyromegaly Cardiovascular: RRR, no m/r/g. No LE edema. Respiratory: CTA bilaterally, no w/r/r. Normal respiratory effort. Abdomen: soft, nt, nd Skin: no rash or induration seen on limited exam Musculoskeletal: grossly normal tone BUE/BLE Psychiatric: grossly normal mood and affect, speech fluent and appropriate Neurologic: no facial asymmetry, moves extremities equally          Labs on Admission:  Basic Metabolic Panel:  Recent Labs Lab 09/10/14 0738  NA 142  K 4.1  CL 101  CO2 17*  GLUCOSE 210*  BUN 17  CREATININE 0.96  CALCIUM 9.4   Liver Function Tests: No results for input(s): AST, ALT, ALKPHOS, BILITOT, PROT, ALBUMIN in the last 168 hours. No results for input(s): LIPASE, AMYLASE in the last 168 hours. No results for input(s): AMMONIA in the last 168 hours. CBC:  Recent Labs Lab 09/10/14 0738  WBC 8.1  NEUTROABS 6.7  HGB 12.0*  HCT 37.0*  MCV 92.5  PLT 242   Cardiac Enzymes: No results for input(s): CKTOTAL, CKMB, CKMBINDEX, TROPONINI in the last 168 hours.  BNP (last 3 results) No results for input(s): PROBNP in  the last 8760 hours. CBG:  Recent Labs Lab 09/10/14 0740  GLUCAP 159*    Radiological Exams on Admission: Dg Chest Port 1 View  09/10/2014   CLINICAL DATA:  Hypoglycemia, lethargy  EXAM: PORTABLE CHEST - 1 VIEW  COMPARISON:  06/29/2014  FINDINGS: The heart size and mediastinal contours are within normal limits. Both lungs are clear. The visualized skeletal structures are unremarkable. Lungs are hypoaerated with crowding of the bronchovascular markings.  IMPRESSION: No active disease.   Electronically Signed   By: Conchita Paris M.D.   On: 09/10/2014 08:17     Assessment/Plan Active Problems:   SIRS (systemic inflammatory response syndrome) - Will plan on placing on IV broad spectrum antibiotics - no source of infection identified but patient had recent history of MSSA bacteremia. He denies any pain or rashes. Work up with chest x ray and urinalysis negative for source - May consider ID consult given history  Lactic acidosis - May be secondary to infectious etiology given history but no source of infection found at this juncture - Blood cultures results pending, urine cultures pending - resolved after IV fluid boluses and rehydration - will continue normal saline at this point  Hypoglycemia - Will hold home regimen and place on SSI - diabetic diet  DM - SSI and routine cbg's  Protein calorie malnutrition - continue nutritional supplementation while in house.    Code Status: full DVT Prophylaxis:  Family Communication:  Disposition Plan:  Time spent: > 55 minutes  Velvet Bathe [facility] Pager [phone]

## 2014-09-10 NOTE — ED Provider Notes (Signed)
CSN: [id number]     Arrival date & time 09/10/14  [id number] History   First MD Initiated Contact with Patient 09/10/14 [phone]     Chief Complaint  Patient presents with  . Hypoglycemia    treated by EMS     (Consider location/radiation/quality/duration/timing/severity/associated sxs/prior Treatment) Pt speaks [place]. History obtained from family member who is bedside. HPI  Brian Owen is a 63 y.o. male with PMH of DM type 2 with history of hypoglycemia and admitted for sepsis 06/2014 presenting with hypoglycemia. Family reports "low" CBG readings all day yesterday. Family member states he was sleepy but responsive. EMS found patient to have CBG 22 and administered D50 which raised CBG to 200. Pt CBG 158 in ED. Pt also with rectal temp of 92.3 and tachycardic to 101. Pt shaking. Family states he is without confusion and mentating like normal after administration of D50. Family member denies pt having fevers, chills, cough, CP, SOB, N/V/D. Pt admitted September 24-30, 2015 for sepsis with UTI and pyelonephritis, found to be bacteremic with MSSA and family reports he recently finished the last dose of ancef. Total abx duration 6 weeks. Pt left hospital AMA. Found to have possible septic emboli of kidneys. Pt recently stopped lantus daily and taking novolog three times daily.    Past Medical History  Diagnosis Date  . Diabetes mellitus without complication    Past Surgical History  Procedure Laterality Date  . No past surgeries     History reviewed. No pertinent family history. History  Substance Use Topics  . Smoking status: Former Smoker    Types: Cigarettes  . Smokeless tobacco: Former Systems developer    Quit date: 03/29/2014  . Alcohol Use: No    Review of Systems  Constitutional: Negative for fever and chills.  HENT: Negative for congestion and rhinorrhea.   Eyes: Negative for visual disturbance.  Respiratory: Negative for cough and shortness of breath.   Cardiovascular: Negative for chest  pain and palpitations.  Gastrointestinal: Negative for nausea, vomiting and diarrhea.  Genitourinary: Negative for dysuria and hematuria.  Musculoskeletal: Negative for back pain and gait problem.  Skin: Negative for rash.  Neurological: Negative for weakness and headaches.      Allergies  Review of patient's allergies indicates no known allergies.  Home Medications   Prior to Admission medications   Medication Sig Start Date End Date Taking? Authorizing Provider  insulin NPH-regular Human (NOVOLIN 70/30) (70-30) 100 UNIT/ML injection Inject 15 Units into the skin 2 (two) times daily with a meal. 08/27/14  Yes Josalyn C [name], MD  loperamide (IMODIUM) 2 MG capsule 4 mg once, then 2 mg after every loose stool, up to 16 mg daily. 08/17/14  Yes Josalyn C [name], MD  Nutritional Supplements (ENSURE HIGH PROTEIN) POWD Take 4 oz by mouth 2 (two) times daily between meals. 4 oz of powder mixed with 6 oz of water 08/02/14  Yes Josalyn C [name], MD   BP 148/82 mmHg  Pulse 90  Temp(Src) 98.5 F (36.9 C) (Rectal)  Resp 22  Wt 120 lb (54.432 kg)  SpO2 100% Physical Exam  Constitutional: He appears well-developed and well-nourished. No distress.  Cachectic male who is shaking intermittently.  HENT:  Head: Normocephalic and atraumatic.  Eyes: Conjunctivae and EOM are normal. Right eye exhibits no discharge. Left eye exhibits no discharge.  Neck: Normal range of motion.  No nuchal rigidity  Cardiovascular: Normal rate, regular rhythm and normal heart sounds.   Pulmonary/Chest: Effort normal and breath sounds normal.  No respiratory distress. He has no wheezes.  Abdominal: Soft. Bowel sounds are normal. He exhibits no distension. There is no tenderness.  Neurological: He is alert. He exhibits normal muscle tone. Coordination normal.  Skin: Skin is warm and dry. He is not diaphoretic.  Nursing note and vitals reviewed.   ED Course  Procedures (including critical care time) Labs  Review Labs Reviewed  BASIC METABOLIC PANEL - Abnormal; Notable for the following:    CO2 17 (*)    Glucose, Bld 210 (*)    GFR calc non Af Amer 86 (*)    Anion gap 24 (*)    All other components within normal limits  CBC WITH DIFFERENTIAL - Abnormal; Notable for the following:    RBC 4.00 (*)    Hemoglobin 12.0 (*)    HCT 37.0 (*)    Neutrophils Relative % 83 (*)    All other components within normal limits  URINALYSIS, ROUTINE W REFLEX MICROSCOPIC - Abnormal; Notable for the following:    Hgb urine dipstick TRACE (*)    All other components within normal limits  LACTIC ACID, PLASMA - Abnormal; Notable for the following:    Lactic Acid, Venous 6.0 (*)    All other components within normal limits  CBG MONITORING, ED - Abnormal; Notable for the following:    Glucose-Capillary 159 (*)    All other components within normal limits  I-STAT CG4 LACTIC ACID, ED - Abnormal; Notable for the following:    Lactic Acid, Venous 9.92 (*)    All other components within normal limits  URINE CULTURE  CULTURE, BLOOD (ROUTINE X 2)  CULTURE, BLOOD (ROUTINE X 2)  URINE MICROSCOPIC-ADD ON  I-STAT CG4 LACTIC ACID, ED    Imaging Review Dg Chest Port 1 View  09/10/2014   CLINICAL DATA:  Hypoglycemia, lethargy  EXAM: PORTABLE CHEST - 1 VIEW  COMPARISON:  06/29/2014  FINDINGS: The heart size and mediastinal contours are within normal limits. Both lungs are clear. The visualized skeletal structures are unremarkable. Lungs are hypoaerated with crowding of the bronchovascular markings.  IMPRESSION: No active disease.   Electronically Signed   By: Conchita Paris M.D.   On: 09/10/2014 08:17     EKG Interpretation None      Meds given in ED:  Medications  0.9 %  sodium chloride infusion (1,000 mLs Intravenous Transfusing/Transfer 09/10/14 1057)  piperacillin-tazobactam (ZOSYN) IVPB 3.375 g (not administered)  vancomycin (VANCOCIN) IVPB 750 mg/150 ml premix (not administered)  piperacillin-tazobactam  (ZOSYN) IVPB 3.375 g (0 g Intravenous Stopped 09/10/14 0915)  vancomycin (VANCOCIN) IVPB 1000 mg/200 mL premix (0 mg Intravenous Stopped 09/10/14 1016)  sodium chloride 0.9 % bolus 1,000 mL (0 mLs Intravenous Stopped 09/10/14 1037)  sodium chloride 0.9 % bolus 1,000 mL (1,000 mLs Intravenous Transfusing/Transfer 09/10/14 1057)    New Prescriptions   No medications on file      MDM   Final diagnoses:  Hypoglycemia  Sepsis, due to unspecified organism   Pt with PMH of DM with recent admission for bacteremia in September treated with Ancef for 6 weeks presents today with hypoglycemia. CBG 22 given D50 by EMS and CBG 200. In ED patient without altered mental status per family found to be hypothermic to 92.3 with mild tachycardia of 102 with lactic acid of 9.9. Pt with Passive rewarming. No source of infection identified at this time. Urine and chest x-ray clear. Pt with anion gap 24. No ketones in urine. Patient given 3L IV fluids and  IV Vanc/Zosyn. Patient in no acute distress sitting in bed eating a sandwich. Lactic acid decreased to 6.0 and temperature improved to 98.69F. Consult to hospitalists and spoke with Dr. Wendee Beavers who agrees to evaluation the patient with the plan to admit to step down.   Discussed all results and patient verbalizes understanding and agrees with plan.  This is a shared patient. This patient was discussed with the physician, Dr. Reather Converse who saw and evaluated the patient and agrees with the plan.    Pura Spice, PA-C 09/10/14 1128  Mariea Clonts, MD 09/10/14 [phone]

## 2014-09-10 NOTE — ED Notes (Signed)
Bear Hugger removed. Warm blankets given.

## 2014-09-10 NOTE — ED Notes (Signed)
Pt daughter reported pt could not find wallet. rn called up to ICU, wallet was in room.

## 2014-09-10 NOTE — Telephone Encounter (Signed)
Yes. Verbal order, from Dr. Adrian Blackwater,  to continue home health nursing for labile blood sugars.

## 2014-09-10 NOTE — ED Notes (Signed)
Pt from home. Family reports CBG read "low" all day yesterday, pt was lethargic all day yesterday as well. EMS called this am. CBG read 22, EMS gave an amp of D50, which raised CBG to 200. Pt shaking and states he feels cold, family reports this is normal after his CBG is low.

## 2014-09-10 NOTE — ED Notes (Signed)
Bed: [id number] Expected date:  Expected time:  Means of arrival:  Comments: EMS-low CBG

## 2014-09-10 NOTE — ED Notes (Addendum)
Bear Hugger applied. DG at bedside obtaining DG of chest. Pt alert and oriented x4. Pt denies pain.   Daughter reports CBG read low last night and father/pt refused to eat soup. Pt confused when awoken. Per daughter father/pt normal at present time.

## 2014-09-10 NOTE — Progress Notes (Signed)
ANTIBIOTIC CONSULT NOTE - INITIAL  Pharmacy Consult for vancomycin/zosyn Indication: rule out sepsis  No Known Allergies  Patient Measurements: Weight: 120 lb (54.432 kg) Adjusted Body Weight:   Vital Signs: Temp: 92.3 F (33.5 C) (11/13 0743) Temp Source: Rectal (11/13 0743) BP: 151/98 mmHg (11/13 0830) Pulse Rate: 99 (11/13 0830) Intake/Output from previous day:   Intake/Output from this shift:    Labs:  Recent Labs  09/10/14 0738  WBC 8.1  HGB 12.0*  PLT 242   Estimated Creatinine Clearance: 66.9 mL/min (by C-G formula based on Cr of 0.87). No results for input(s): VANCOTROUGH, VANCOPEAK, VANCORANDOM, GENTTROUGH, GENTPEAK, GENTRANDOM, TOBRATROUGH, TOBRAPEAK, TOBRARND, AMIKACINPEAK, AMIKACINTROU, AMIKACIN in the last 72 hours.   Microbiology: No results found for this or any previous visit (from the past 720 hour(s)).  Medical History: Past Medical History  Diagnosis Date  . Diabetes mellitus without complication    Assessment: 43 YOM presents with hypoglyemia and possible sepsis.  In September patient had MSSA bacteremia from SSI/pyelonephritis - appears he left AMA then followed-up with ID as outpatient (completed 6weeks cefazolin was to be on 09/02/14).   No evidence of endocarditis at that time. Starting vancomycin and zosyn for possible sepsis  11/13 >> vancomycin  >> 11/13 >> zosyn  >>    Tmax: Tmin = 92.3 WBCs: WNL Renal: SCR WNL with Normalized CrCl = 54ml/min  11/13 blood: 11/13 urine:    Goal of Therapy:  Vancomycin trough level 15-20 mcg/ml  Plan:   Vancomycin 1gm x 1 then 750mg  IV q12h  Check steady state trough  Monitor renal function  Zosyn 3.375gm IV q8h over 4h infusion  Follow culture results/clinical progress  Doreene Eland, PharmD, BCPS.   Pager: [phone]  09/10/2014,8:59 AM

## 2014-09-10 NOTE — ED Notes (Signed)
Patient's daughter, Wess Botts [phone]

## 2014-09-10 NOTE — Progress Notes (Addendum)
Report given to [place] on [address] at 2215.  Relayed orders (as discussed with Walden Field) about Q4 CBG and no coverage during night due to hypoglycemic event this morning.  Pt going to 1436 telemetry.  Informed Elink of transfer.  Vital signs stable at transfer, transferred via wheelchair. Pt steady on feet at transfer and alert and oriented.

## 2014-09-10 NOTE — Telephone Encounter (Signed)
Received call from [facility], Patients home health nurse. Nira Conn states she is no longer seeing this patient for IV antibiotics. Nira Conn states that if possible she would like an order to continue to see patient for she uncontrolled blood sugars. Nira Conn states that patients sugar levels range from being too high to too low. Please continue Heather to discuss          Is it ok for [facility] to continue seeing pt for uncontrolled CBG?

## 2014-09-10 NOTE — Progress Notes (Signed)
History Brian Owen has a history of DM, protein calorie malnutrition, history of poor compliance taking medications, recent MSSA bacteremia s/p 6 wks of IV antibiotics Cefazolin with last dose on 09/02/14 per last ID office note on 08/18/14. He presented to the ED presents after hypoglycemic episode.Admitted for hypoglycemia and lactic acidosis as well as concern for infection.  No clear source of infection and lactic acidosis resolved. Stable for transfer to telemetry unit.  Objective: Vital signs in last 24 hours: Temp:  [92.3 F (33.5 C)-98.5 F (36.9 C)] 97.9 F (36.6 C) (11/13 1700) Pulse Rate:  [77-103] 89 (11/13 1800) Resp:  [14-28] 21 (11/13 1800) BP: (106-156)/(60-99) 156/76 mmHg (11/13 1800) SpO2:  [97 %-100 %] 100 % (11/13 1800) Weight:  [54.432 kg (120 lb)] 54.432 kg (120 lb) (11/13 0758)  Intake/Output from previous day:   Intake/Output this shift:    BP 156/76 mmHg  Pulse 89  Temp(Src) 97.9 F (36.6 C) (Oral)  Resp 21  Wt 54.432 kg (120 lb)  SpO2 100% General:  No acute distress, awake, alert, appears oriented although [place] limited. Calm, cooperative. Heart:       Regular rate and rhythm Lungs:      Clear bilaterally  Results for orders placed or performed during the hospital encounter of 09/10/14 (from the past 24 hour(s))  Basic metabolic panel     Status: Abnormal   Collection Time: 09/10/14  7:38 AM  Result Value Ref Range   Sodium 142 137 - 147 mEq/L   Potassium 4.1 3.7 - 5.3 mEq/L   Chloride 101 96 - 112 mEq/L   CO2 17 (L) 19 - 32 mEq/L   Glucose, Bld 210 (H) 70 - 99 mg/dL   BUN 17 6 - 23 mg/dL   Creatinine, Ser 0.96 0.50 - 1.35 mg/dL   Calcium 9.4 8.4 - 10.5 mg/dL   GFR calc non Af Amer 86 (L) >90 mL/min   GFR calc Af Amer >90 >90 mL/min   Anion gap 24 (H) 5 - 15  CBC with Differential     Status: Abnormal   Collection Time: 09/10/14  7:38 AM  Result Value Ref Range   WBC 8.1 4.0 - 10.5 K/uL   RBC 4.00 (L) 4.22 - 5.81 MIL/uL   Hemoglobin 12.0  (L) 13.0 - 17.0 g/dL   HCT 37.0 (L) 39.0 - 52.0 %   MCV 92.5 78.0 - 100.0 fL   MCH 30.0 26.0 - 34.0 pg   MCHC 32.4 30.0 - 36.0 g/dL   RDW 14.8 11.5 - 15.5 %   Platelets 242 150 - 400 K/uL   Neutrophils Relative % 83 (H) 43 - 77 %   Neutro Abs 6.7 1.7 - 7.7 K/uL   Lymphocytes Relative 14 12 - 46 %   Lymphs Abs 1.1 0.7 - 4.0 K/uL   Monocytes Relative 3 3 - 12 %   Monocytes Absolute 0.2 0.1 - 1.0 K/uL   Eosinophils Relative 0 0 - 5 %   Eosinophils Absolute 0.0 0.0 - 0.7 K/uL   Basophils Relative 0 0 - 1 %   Basophils Absolute 0.0 0.0 - 0.1 K/uL  POC CBG, ED     Status: Abnormal   Collection Time: 09/10/14  7:40 AM  Result Value Ref Range   Glucose-Capillary 159 (H) 70 - 99 mg/dL  Urinalysis, Routine w reflex microscopic     Status: Abnormal   Collection Time: 09/10/14  7:59 AM  Result Value Ref Range   Color, Urine YELLOW  YELLOW   APPearance CLEAR CLEAR   Specific Gravity, Urine 1.008 1.005 - 1.030   pH 5.5 5.0 - 8.0   Glucose, UA NEGATIVE NEGATIVE mg/dL   Hgb urine dipstick TRACE (A) NEGATIVE   Bilirubin Urine NEGATIVE NEGATIVE   Ketones, ur NEGATIVE NEGATIVE mg/dL   Protein, ur NEGATIVE NEGATIVE mg/dL   Urobilinogen, UA 0.2 0.0 - 1.0 mg/dL   Nitrite NEGATIVE NEGATIVE   Leukocytes, UA NEGATIVE NEGATIVE  Urine microscopic-add on     Status: None   Collection Time: 09/10/14  7:59 AM  Result Value Ref Range   WBC, UA 3-6 <3 WBC/hpf   RBC / HPF 0-2 <3 RBC/hpf  I-Stat CG4 Lactic Acid, ED     Status: Abnormal   Collection Time: 09/10/14  8:21 AM  Result Value Ref Range   Lactic Acid, Venous 9.92 (H) 0.5 - 2.2 mmol/L  Lactic acid, plasma     Status: Abnormal   Collection Time: 09/10/14  8:48 AM  Result Value Ref Range   Lactic Acid, Venous 6.0 (H) 0.5 - 2.2 mmol/L  I-Stat CG4 Lactic Acid, ED     Status: None   Collection Time: 09/10/14 11:01 AM  Result Value Ref Range   Lactic Acid, Venous 2.06 0.5 - 2.2 mmol/L  MRSA PCR Screening     Status: None   Collection Time:  09/10/14 12:37 PM  Result Value Ref Range   MRSA by PCR NEGATIVE NEGATIVE  Glucose, capillary     Status: None   Collection Time: 09/10/14  5:43 PM  Result Value Ref Range   Glucose-Capillary 95 70 - 99 mg/dL   Comment 1 Documented in Chart    Comment 2 Notify RN     Studies/Results: Dg Chest Port 1 View  09/10/2014   CLINICAL DATA:  Hypoglycemia, lethargy  EXAM: PORTABLE CHEST - 1 VIEW  COMPARISON:  06/29/2014  FINDINGS: The heart size and mediastinal contours are within normal limits. Both lungs are clear. The visualized skeletal structures are unremarkable. Lungs are hypoaerated with crowding of the bronchovascular markings.  IMPRESSION: No active disease.   Electronically Signed   By: Conchita Paris M.D.   On: 09/10/2014 08:17    Scheduled Meds: . feeding supplement (ENSURE COMPLETE)  120 mL Oral BID BM  . heparin  5,000 Units Subcutaneous 3 times per day  . insulin aspart  0-15 Units Subcutaneous TID WC  . piperacillin-tazobactam (ZOSYN)  IV  3.375 g Intravenous Q8H  . sodium chloride  3 mL Intravenous Q12H  . vancomycin  750 mg Intravenous Q12H   Continuous Infusions: . sodium chloride 100 mL/hr at 09/10/14 1229   PRN Meds:ondansetron **OR** ondansetron (ZOFRAN) IV  Assessment/Plan:  Hypoglycemia.  Continue to monitor CBG achs. Currently on SSI only. Stable for transfer.  Lactic acidosis. Resolved. No clear source of infection. Continue broad spectrum Zosyn and Vancomycin.    LOS: 0 days   Jackqueline Aquilar A.

## 2014-09-10 NOTE — Care Management Note (Signed)
    Page 1 of 2   09/10/2014     11:39:55 AM CARE MANAGEMENT NOTE 09/10/2014  Patient:  Brian Owen,Brian Owen   Account Number:  [id number]  Date Initiated:  09/10/2014  Documentation initiated by:  DAVIS,RHONDA  Subjective/Objective Assessment:   patient with hx of hypoglycemia, was found by family lethgaric and cbg of 22, d50amp given on arrival.     Action/Plan:   home when stable   Anticipated DC Date:  09/13/2014   Anticipated DC Plan:  HOME/SELF CARE  In-house referral  NA      DC Planning Services  NA      [facility] Choice  NA   Choice offered to / List presented to:  NA   DME arranged  NA      DME agency  NA     [place] arranged  NA      [place] agency  NA   Status of service:  In process, will continue to follow Medicare Important Message given?   (If response is "NO", the following Medicare IM given date fields will be blank) Date Medicare IM given:   Medicare IM given by:   Date Additional Medicare IM given:   Additional Medicare IM given by:    Discharge Disposition:    Per UR Regulation:  Reviewed for med. necessity/level of care/duration of stay  If discussed at [place] of Stay Meetings, dates discussed:    Comments:  09/10/2014/Rhonda L. Rosana Hoes, RN, BSN, CCM: Chart review for medical necessity and patient discharge needs. Case Manager will follow for patient condition changes. 09/10/2014/Rhonda L. Rosana Hoes, RN, BSN, CCM: CHART NOTE FOR PROGRESSION: patient admitted into icu due to continued lethgary.

## 2014-09-10 NOTE — ED Notes (Signed)
CBG 158. Denies any cough, URI symptoms or n/v.

## 2014-09-10 NOTE — Plan of Care (Signed)
Problem: Phase I Progression Outcomes Goal: OOB as tolerated unless otherwise ordered Outcome: Completed/Met Date Met:  09/10/14

## 2014-09-10 NOTE — ED Notes (Signed)
At present time pt has had 300 ml via antibotics, 250 ml via 125 mh/hr NS, and 2 liter bolus.

## 2014-09-10 NOTE — Telephone Encounter (Signed)
Unable to contact nurse. Voice mail is not set up

## 2014-09-10 NOTE — Plan of Care (Signed)
Problem: Phase I Progression Outcomes Goal: Voiding-avoid urinary catheter unless indicated Outcome: Completed/Met Date Met:  09/10/14     

## 2014-09-10 NOTE — Telephone Encounter (Signed)
Received call from [facility], Patients home health nurse. Nira Conn states she is no longer seeing this patient for IV antibiotics. Nira Conn states that if possible she would like an order to continue to see patient for she uncontrolled blood sugars. Nira Conn states that patients sugar levels range from being too high to too low. Please continue Nira Conn to discuss

## 2014-09-11 DIAGNOSIS — R6510 Systemic inflammatory response syndrome (SIRS) of non-infectious origin without acute organ dysfunction: Secondary | ICD-10-CM

## 2014-09-11 DIAGNOSIS — E872 Acidosis: Secondary | ICD-10-CM

## 2014-09-11 DIAGNOSIS — E11649 Type 2 diabetes mellitus with hypoglycemia without coma: Secondary | ICD-10-CM

## 2014-09-11 LAB — BASIC METABOLIC PANEL
ANION GAP: 11 (ref 5–15)
BUN: 10 mg/dL (ref 6–23)
CHLORIDE: 104 meq/L (ref 96–112)
CO2: 28 mEq/L (ref 19–32)
Calcium: 9 mg/dL (ref 8.4–10.5)
Creatinine, Ser: 1.34 mg/dL (ref 0.50–1.35)
GFR, EST AFRICAN AMERICAN: 64 mL/min — AB (ref >90)
GFR, EST NON AFRICAN AMERICAN: 55 mL/min — AB (ref >90)
Glucose, Bld: 137 mg/dL — ABNORMAL HIGH (ref 70–99)
POTASSIUM: 3.9 meq/L (ref 3.7–5.3)
Sodium: 143 mEq/L (ref 137–147)

## 2014-09-11 LAB — URINE CULTURE: Colony Count: 5000

## 2014-09-11 LAB — GLUCOSE, CAPILLARY
GLUCOSE-CAPILLARY: 97 mg/dL (ref 70–99)
Glucose-Capillary: 113 mg/dL — ABNORMAL HIGH (ref 70–99)
Glucose-Capillary: 131 mg/dL — ABNORMAL HIGH (ref 70–99)
Glucose-Capillary: 162 mg/dL — ABNORMAL HIGH (ref 70–99)
Glucose-Capillary: 192 mg/dL — ABNORMAL HIGH (ref 70–99)
Glucose-Capillary: 298 mg/dL — ABNORMAL HIGH (ref 70–99)

## 2014-09-11 LAB — CBC
HCT: 32 % — ABNORMAL LOW (ref 39.0–52.0)
HEMOGLOBIN: 10.5 g/dL — AB (ref 13.0–17.0)
MCH: 29.8 pg (ref 26.0–34.0)
MCHC: 32.8 g/dL (ref 30.0–36.0)
MCV: 90.9 fL (ref 78.0–100.0)
PLATELETS: 237 10*3/uL (ref 150–400)
RBC: 3.52 MIL/uL — AB (ref 4.22–5.81)
RDW: 15.1 % (ref 11.5–15.5)
WBC: 6.3 10*3/uL (ref 4.0–10.5)

## 2014-09-11 LAB — LACTIC ACID, PLASMA: Lactic Acid, Venous: 1.9 mmol/L (ref 0.5–2.2)

## 2014-09-11 MED ORDER — CEFAZOLIN SODIUM-DEXTROSE 2-3 GM-% IV SOLR
2.0000 g | Freq: Four times a day (QID) | INTRAVENOUS | Status: DC
  Administered 2014-09-11: 2 g via INTRAVENOUS
  Filled 2014-09-11 (×2): qty 50

## 2014-09-11 MED ORDER — LEVOFLOXACIN IN D5W 750 MG/150ML IV SOLN
750.0000 mg | INTRAVENOUS | Status: DC
  Filled 2014-09-11: qty 150

## 2014-09-11 NOTE — Progress Notes (Addendum)
ANTIBIOTIC CONSULT NOTE - Initial  Pharmacy Consult for Ancef Indication: rule out sepsis, hx MSSA bacteremia  No Known Allergies  Patient Measurements: Height: 5\' 1"  (154.9 cm) Weight: 120 lb (54.432 kg) IBW/kg (Calculated) : 52.3 Adjusted Body Weight:   Vital Signs: Temp: 98.2 F (36.8 C) (11/14 0543) Temp Source: Oral (11/14 0543) BP: 151/78 mmHg (11/14 0543) Pulse Rate: 73 (11/14 0543) Intake/Output from previous day: 11/13 0701 - 11/14 0700 In: 6249.7 [P.O.:720; I.V.:5429.7; IV Piggyback:100] Out: [id number] [Urine:3710; Stool:1] Intake/Output from this shift: Total I/O In: 240 [P.O.:240] Out: 700 [Urine:700]  Labs:  Recent Labs  09/10/14 0738 09/11/14 0439  WBC 8.1 6.3  HGB 12.0* 10.5*  PLT 242 237  CREATININE 0.96 1.34   Estimated Creatinine Clearance: 41.7 mL/min (by C-G formula based on Cr of 1.34). No results for input(s): VANCOTROUGH, VANCOPEAK, VANCORANDOM, GENTTROUGH, GENTPEAK, GENTRANDOM, TOBRATROUGH, TOBRAPEAK, TOBRARND, AMIKACINPEAK, AMIKACINTROU, AMIKACIN in the last 72 hours.   Microbiology: Recent Results (from the past 720 hour(s))  Urine culture     Status: None   Collection Time: 09/10/14  7:59 AM  Result Value Ref Range Status   Specimen Description URINE, CLEAN CATCH  Final   Special Requests NONE  Final   Culture  Setup Time   Final    09/10/2014 10:44 Performed at [place]   Final    5,000 COLONIES/ML Performed at [facility]    Culture   Final    INSIGNIFICANT GROWTH Performed at [facility]    Report Status 09/11/2014 FINAL  Final  MRSA PCR Screening     Status: None   Collection Time: 09/10/14 12:37 PM  Result Value Ref Range Status   MRSA by PCR NEGATIVE NEGATIVE Final    Comment:        The GeneXpert MRSA Assay (FDA approved for NASAL specimens only), is one component of a comprehensive MRSA colonization surveillance program. It is not intended to diagnose MRSA infection nor  to guide or monitor treatment for MRSA infections.     Medical History: Past Medical History  Diagnosis Date  . Diabetes mellitus without complication    Assessment: 18 YOM presents with hypoglyemia and possible sepsis.  In September patient had MSSA bacteremia from SSI/pyelonephritis - appears he left AMA then followed-up with ID as outpatient (completed 6weeks cefazolin was to be on 09/02/14).   No evidence of endocarditis at that time. Starting vancomycin and zosyn for possible sepsis  11/13 >> vancomycin  >> 11/14 11/13 >> zosyn  >>  11/14 11/14 >> Ancef >>  Tmax: Tmin = 92.3 on 11/13 AM WBCs: WNL Renal: SCr 1.34, up. CrCl 42 ml/min  11/13 blood: pending 11/13 urine: pending   Goal of Therapy:  Treatment of infection  Plan:  1) Ancef 2g IV q6 for CrCl > 30 ml/min  Adrian Saran, PharmD, BCPS Pager [phone] 09/11/2014 12:43 PM

## 2014-09-11 NOTE — Consult Note (Addendum)
[facility]     Reason for Consult: lactic acidosis    Referring Physician: Dr. Wendee Beavers  Principal Problem:   SIRS (systemic inflammatory response syndrome) Active Problems:   Diabetes mellitus type 2, uncontrolled   History of hypertension   Protein-calorie malnutrition, severe   . feeding supplement (ENSURE COMPLETE)  120 mL Oral BID BM  . heparin  5,000 Units Subcutaneous 3 times per day  . insulin aspart  0-15 Units Subcutaneous TID WC  . sodium chloride  3 mL Intravenous Q12H    Recommendations: Stop antibiotics  Supportive care  Assessment: He has lactic acidosis, elevated anion gap acidosis, and came in with poor response and hypoglycemia.  Lactate was initially over 9 but responded quickly to supportive therapy and now normal.  Unclear etiology but known to occur in diabetics.  No signs of infection with normal WBC, no fever, no symptoms.    Antibiotics: cefaozlin one dose  HPI: Brian Owen is a 63 y.o. male with a history of poorly controlled diabetes, poor compliance who was previously hosptialized with MSSA sepsis from bilateral thigh abscess but left AMA from hospital then came in September.  Treated with 6 weeks of IV cefazolin and completed course.  No further thigh pain.  Now on insulin for diabetes, previous A1c reported as 16.  He does not remember coming in here but glucose was 22.  He denies taking any recent metformin.  Lactate up but at admission but now normal.  Cultures all negative.    Review of Systems: A comprehensive review of systems was negative.  Past Medical History  Diagnosis Date  . Diabetes mellitus without complication     History  Substance Use Topics  . Smoking status: Former Smoker    Types: Cigarettes  . Smokeless tobacco: Former Systems developer    Quit date: 03/29/2014  . Alcohol Use: No    History reviewed. No pertinent family history. No Known Allergies  OBJECTIVE: Blood pressure 117/66, pulse 84,  temperature 98.2 F (36.8 C), temperature source Oral, resp. rate 20, height 5\' 1"  (1.549 m), weight 120 lb (54.432 kg), SpO2 99 %. General: awake, alert, nad Skin: no rasehs Lungs: CTA B Cor: RRR Abdomen: soft, nt, nd Ext: no edema  Microbiology: Recent Results (from the past 240 hour(s))  Urine culture     Status: None   Collection Time: 09/10/14  7:59 AM  Result Value Ref Range Status   Specimen Description URINE, CLEAN CATCH  Final   Special Requests NONE  Final   Culture  Setup Time   Final    09/10/2014 10:44 Performed at [place]   Final    5,000 COLONIES/ML Performed at [facility]    Culture   Final    INSIGNIFICANT GROWTH Performed at [facility]    Report Status 09/11/2014 FINAL  Final  Blood Culture (routine x 2)     Status: None (Preliminary result)   Collection Time: 09/10/14  8:15 AM  Result Value Ref Range Status   Specimen Description BLOOD LEFT ANTECUBITAL  Final   Special Requests BOTTLES DRAWN AEROBIC AND ANAEROBIC 3CC  Final   Culture  Setup Time   Final    09/10/2014 10:36 Performed at [facility]    Culture   Final           BLOOD CULTURE RECEIVED NO GROWTH TO DATE CULTURE WILL BE HELD FOR 5 DAYS BEFORE ISSUING  A FINAL NEGATIVE REPORT Performed at [facility]    Report Status PENDING  Incomplete  Blood Culture (routine x 2)     Status: None (Preliminary result)   Collection Time: 09/10/14  8:48 AM  Result Value Ref Range Status   Specimen Description BLOOD LEFT ANTECUBITAL  Final   Special Requests BOTTLES DRAWN AEROBIC AND ANAEROBIC 3CC  Final   Culture  Setup Time   Final    09/10/2014 10:36 Performed at [facility]    Culture   Final           BLOOD CULTURE RECEIVED NO GROWTH TO DATE CULTURE WILL BE HELD FOR 5 DAYS BEFORE ISSUING A FINAL NEGATIVE REPORT Performed at [facility]    Report Status PENDING  Incomplete  MRSA PCR Screening     Status: None    Collection Time: 09/10/14 12:37 PM  Result Value Ref Range Status   MRSA by PCR NEGATIVE NEGATIVE Final    Comment:        The GeneXpert MRSA Assay (FDA approved for NASAL specimens only), is one component of a comprehensive MRSA colonization surveillance program. It is not intended to diagnose MRSA infection nor to guide or monitor treatment for MRSA infections.     Scharlene Gloss, [place] [facility] [place] [email or web address] [phone] pager  [phone] cell 09/11/2014, 3:49 PM

## 2014-09-11 NOTE — Progress Notes (Signed)
[facility] PROGRESS NOTE  Edwin Dada [id number] DOB: 07/29/1951 DOA: 09/10/2014 PCP: [name], MD  Assessment/Plan: SIRS (systemic inflammatory response syndrome) - Consulted ID - Not sure if initial findings were 2ary to infectious etiology as patient has normal WBC levels - Will place on Cefazolin at this point until we get ID's recommendations and go from there. - On history patient was treated for MSSA for 6 wks with Cefazolin with end date in early week of November (6th)  Lactic acidosis - Resolved with IVF hydration  Hypoglycemia - Will hold home regimen and place on SSI - diabetic diet  DM - SSI and routine cbg's  Protein calorie malnutrition - continue nutritional supplementation while in house.  Code Status: full Family Communication: no family at bedside.  Disposition Plan: Pending resolution of initial problems and input from specialist.   Consultants:  ID  Procedures:  none  Antibiotics:  Cefazolin  HPI/Subjective: Pt has no new complaints  Objective: Filed Vitals:   09/11/14 1347  BP: 117/66  Pulse: 84  Temp: 98.2 F (36.8 C)  Resp: 20    Intake/Output Summary (Last 24 hours) at 09/11/14 1426 Last data filed at 09/11/14 1300  Gross per 24 hour  Intake   2780 ml  Output   3311 ml  Net   -531 ml   Filed Weights   09/10/14 0758 09/10/14 2314  Weight: 54.432 kg (120 lb) 54.432 kg (120 lb)    Exam:   General:  Pt in nad, alert and awake  Cardiovascular: rrr, no mrg  Respiratory: cta bl, no wheezes  Abdomen: soft, ND, nt  Musculoskeletal: no cyanosis or clubbing   Data Reviewed: Basic Metabolic Panel:  Recent Labs Lab 09/10/14 0738 09/11/14 0439  NA 142 143  K 4.1 3.9  CL 101 104  CO2 17* 28  GLUCOSE 210* 137*  BUN 17 10  CREATININE 0.96 1.34  CALCIUM 9.4 9.0   Liver Function Tests: No results for input(s): AST, ALT, ALKPHOS, BILITOT, PROT, ALBUMIN in the last 168 hours. No results for  input(s): LIPASE, AMYLASE in the last 168 hours. No results for input(s): AMMONIA in the last 168 hours. CBC:  Recent Labs Lab 09/10/14 0738 09/11/14 0439  WBC 8.1 6.3  NEUTROABS 6.7  --   HGB 12.0* 10.5*  HCT 37.0* 32.0*  MCV 92.5 90.9  PLT 242 237   Cardiac Enzymes: No results for input(s): CKTOTAL, CKMB, CKMBINDEX, TROPONINI in the last 168 hours. BNP (last 3 results) No results for input(s): PROBNP in the last 8760 hours. CBG:  Recent Labs Lab 09/10/14 2139 09/11/14 0107 09/11/14 0437 09/11/14 0735 09/11/14 1132  GLUCAP 108* 113* 131* 162* 192*    Recent Results (from the past 240 hour(s))  Urine culture     Status: None   Collection Time: 09/10/14  7:59 AM  Result Value Ref Range Status   Specimen Description URINE, CLEAN CATCH  Final   Special Requests NONE  Final   Culture  Setup Time   Final    09/10/2014 10:44 Performed at [place]   Final    5,000 COLONIES/ML Performed at [facility]    Culture   Final    INSIGNIFICANT GROWTH Performed at [facility]    Report Status 09/11/2014 FINAL  Final  Blood Culture (routine x 2)     Status: None (Preliminary result)   Collection Time: 09/10/14  8:15 AM  Result Value Ref Range  Status   Specimen Description BLOOD LEFT ANTECUBITAL  Final   Special Requests BOTTLES DRAWN AEROBIC AND ANAEROBIC 3CC  Final   Culture  Setup Time   Final    09/10/2014 10:36 Performed at [facility]    Culture   Final           BLOOD CULTURE RECEIVED NO GROWTH TO DATE CULTURE WILL BE HELD FOR 5 DAYS BEFORE ISSUING A FINAL NEGATIVE REPORT Performed at [facility]    Report Status PENDING  Incomplete  Blood Culture (routine x 2)     Status: None (Preliminary result)   Collection Time: 09/10/14  8:48 AM  Result Value Ref Range Status   Specimen Description BLOOD LEFT ANTECUBITAL  Final   Special Requests BOTTLES DRAWN AEROBIC AND ANAEROBIC 3CC  Final   Culture   Setup Time   Final    09/10/2014 10:36 Performed at [facility]    Culture   Final           BLOOD CULTURE RECEIVED NO GROWTH TO DATE CULTURE WILL BE HELD FOR 5 DAYS BEFORE ISSUING A FINAL NEGATIVE REPORT Performed at [facility]    Report Status PENDING  Incomplete  MRSA PCR Screening     Status: None   Collection Time: 09/10/14 12:37 PM  Result Value Ref Range Status   MRSA by PCR NEGATIVE NEGATIVE Final    Comment:        The GeneXpert MRSA Assay (FDA approved for NASAL specimens only), is one component of a comprehensive MRSA colonization surveillance program. It is not intended to diagnose MRSA infection nor to guide or monitor treatment for MRSA infections.      Studies: Dg Chest Port 1 View  09/10/2014   CLINICAL DATA:  Hypoglycemia, lethargy  EXAM: PORTABLE CHEST - 1 VIEW  COMPARISON:  06/29/2014  FINDINGS: The heart size and mediastinal contours are within normal limits. Both lungs are clear. The visualized skeletal structures are unremarkable. Lungs are hypoaerated with crowding of the bronchovascular markings.  IMPRESSION: No active disease.   Electronically Signed   By: Conchita Paris M.D.   On: 09/10/2014 08:17    Scheduled Meds: .  ceFAZolin (ANCEF) IV  2 g Intravenous Q6H  . feeding supplement (ENSURE COMPLETE)  120 mL Oral BID BM  . heparin  5,000 Units Subcutaneous 3 times per day  . insulin aspart  0-15 Units Subcutaneous TID WC  . sodium chloride  3 mL Intravenous Q12H   Continuous Infusions:   Principal Problem:   SIRS (systemic inflammatory response syndrome) Active Problems:   Diabetes mellitus type 2, uncontrolled   History of hypertension   Protein-calorie malnutrition, severe    Time spent: > 35 minutes    Velvet Bathe  [facility] Pager [phone]. If 7PM-7AM, please contact night-coverage at [email or web address], password [facility] 09/11/2014, 2:26 PM  LOS: 1 day

## 2014-09-11 NOTE — Progress Notes (Signed)
Received patient from ICU via wheelchair accompanied by RN and CNA. Awake, alert and oriented. Vital signs stable, no complaints made. Agreed with the previous assessment made earlier by RN.

## 2014-09-12 LAB — GLUCOSE, CAPILLARY
GLUCOSE-CAPILLARY: 166 mg/dL — AB (ref 70–99)
GLUCOSE-CAPILLARY: 177 mg/dL — AB (ref 70–99)
GLUCOSE-CAPILLARY: 291 mg/dL — AB (ref 70–99)
Glucose-Capillary: 153 mg/dL — ABNORMAL HIGH (ref 70–99)

## 2014-09-12 MED ORDER — INSULIN NPH ISOPHANE & REGULAR (70-30) 100 UNIT/ML ~~LOC~~ SUSP: 10.0000 [IU] | Freq: Two times a day (BID) | SUBCUTANEOUS | Status: DC

## 2014-09-12 MED ORDER — LIVING WELL WITH DIABETES BOOK: 1.0000 | Freq: Once | Status: DC

## 2014-09-12 MED ORDER — LIVING WELL WITH DIABETES BOOK
Freq: Once | Status: AC
  Administered 2014-09-12: 15:00:00
  Filled 2014-09-12: qty 1

## 2014-09-12 NOTE — Discharge Summary (Signed)
Physician Discharge Summary  Brian Owen [id number] DOB: Mar 26, 1951 DOA: 09/10/2014  PCP: [name], MD  Admit date: 09/10/2014 Discharge date: 09/12/2014  Time spent: > 35 minutes  Recommendations for Outpatient Follow-up:  Please monitor blood sugars and adjust hypoglycemic agents accordingly. Encourage diabetic diet  Discharge Diagnoses:  Please see list below.  Discharge Condition: stable  Diet recommendation: diabetic (low carb diet)  Filed Weights   09/10/14 0758 09/10/14 2314  Weight: 54.432 kg (120 lb) 54.432 kg (120 lb)    History of present illness:  63 y.o. male  With history of DM, h/o protein calorie malnutrition, history of poor compliance taking medications, recent MSSA bacteremia s/p 6 wks of IV antibiotics Cefazolin with last dose on 09/02/14 per last ID office note on 08/18/14. Who presented after being found hypoglycemic and hypothermic at home.  Hospital Course:  Hypoglycemia - Decreased home insulin regimen by 30 percent. To 10 units SQ BID - Pt to monitor his blood sugars 2 times daily - Recommend he f/u with his pcp in 1-2 weeks or sooner  Hypothermia - resolved  As part of the differential we considered infectious etiology for above problems but patient condition improved without antibiotics and there was no elevated white blood cell count or fevers as such infectious etiology as culprit less likely.  Procedures:  None  Consultations:  none  Discharge Exam: Filed Vitals:   09/12/14 1412  BP: 96/57  Pulse: 88  Temp: 98.4 F (36.9 C)  Resp: 18    General: Pt in nad, alert and awake Cardiovascular: rrr, no mrg Respiratory: cta bl, no wheezes  Discharge Instructions You were cared for by a hospitalist during your hospital stay. If you have any questions about your discharge medications or the care you received while you were in the hospital after you are discharged, you can call the unit and asked to speak with the  hospitalist on call if the hospitalist that took care of you is not available. Once you are discharged, your primary care physician will handle any further medical issues. Please note that NO REFILLS for any discharge medications will be authorized once you are discharged, as it is imperative that you return to your primary care physician (or establish a relationship with a primary care physician if you do not have one) for your aftercare needs so that they can reassess your need for medications and monitor your lab values.  Discharge Instructions    Diet - low sodium heart healthy    Complete by:  As directed      Discharge instructions    Complete by:  As directed   Please f/u with pcp in 1-2 weeks or sooner should any new concerns arise.  Please monitor and record your blood sugars. You are to check them 2 times per day. Once fasting and once postprandial (after eating)     Increase activity slowly    Complete by:  As directed           Current Discharge Medication List    START taking these medications   Details  living well with diabetes book MISC 1 each by Does not apply route once. Qty: 1 each, Refills: 0      CONTINUE these medications which have CHANGED   Details  insulin NPH-regular Human (NOVOLIN 70/30) (70-30) 100 UNIT/ML injection Inject 10 Units into the skin 2 (two) times daily with a meal. Qty: 10 mL, Refills: 11      CONTINUE these medications  which have NOT CHANGED   Details  loperamide (IMODIUM) 2 MG capsule 4 mg once, then 2 mg after every loose stool, up to 16 mg daily. Qty: 30 capsule, Refills: 0    Nutritional Supplements (ENSURE HIGH PROTEIN) POWD Take 4 oz by mouth 2 (two) times daily between meals. 4 oz of powder mixed with 6 oz of water Qty: 771 g, Refills: 5   Associated Diagnoses: Protein-calorie malnutrition, severe       No Known Allergies    The results of significant diagnostics from this hospitalization (including imaging, microbiology,  ancillary and laboratory) are listed below for reference.    Significant Diagnostic Studies: Dg Chest Port 1 View  09/10/2014   CLINICAL DATA:  Hypoglycemia, lethargy  EXAM: PORTABLE CHEST - 1 VIEW  COMPARISON:  06/29/2014  FINDINGS: The heart size and mediastinal contours are within normal limits. Both lungs are clear. The visualized skeletal structures are unremarkable. Lungs are hypoaerated with crowding of the bronchovascular markings.  IMPRESSION: No active disease.   Electronically Signed   By: Conchita Paris M.D.   On: 09/10/2014 08:17    Microbiology: Recent Results (from the past 240 hour(s))  Urine culture     Status: None   Collection Time: 09/10/14  7:59 AM  Result Value Ref Range Status   Specimen Description URINE, CLEAN CATCH  Final   Special Requests NONE  Final   Culture  Setup Time   Final    09/10/2014 10:44 Performed at [place]   Final    5,000 COLONIES/ML Performed at [facility]    Culture   Final    INSIGNIFICANT GROWTH Performed at [facility]    Report Status 09/11/2014 FINAL  Final  Blood Culture (routine x 2)     Status: None (Preliminary result)   Collection Time: 09/10/14  8:15 AM  Result Value Ref Range Status   Specimen Description BLOOD LEFT ANTECUBITAL  Final   Special Requests BOTTLES DRAWN AEROBIC AND ANAEROBIC 3CC  Final   Culture  Setup Time   Final    09/10/2014 10:36 Performed at [facility]    Culture   Final           BLOOD CULTURE RECEIVED NO GROWTH TO DATE CULTURE WILL BE HELD FOR 5 DAYS BEFORE ISSUING A FINAL NEGATIVE REPORT Performed at [facility]    Report Status PENDING  Incomplete  Blood Culture (routine x 2)     Status: None (Preliminary result)   Collection Time: 09/10/14  8:48 AM  Result Value Ref Range Status   Specimen Description BLOOD LEFT ANTECUBITAL  Final   Special Requests BOTTLES DRAWN AEROBIC AND ANAEROBIC 3CC  Final   Culture  Setup Time    Final    09/10/2014 10:36 Performed at [facility]    Culture   Final           BLOOD CULTURE RECEIVED NO GROWTH TO DATE CULTURE WILL BE HELD FOR 5 DAYS BEFORE ISSUING A FINAL NEGATIVE REPORT Performed at [facility]    Report Status PENDING  Incomplete  MRSA PCR Screening     Status: None   Collection Time: 09/10/14 12:37 PM  Result Value Ref Range Status   MRSA by PCR NEGATIVE NEGATIVE Final    Comment:        The GeneXpert MRSA Assay (FDA approved for NASAL specimens only), is one component of a comprehensive MRSA colonization  surveillance program. It is not intended to diagnose MRSA infection nor to guide or monitor treatment for MRSA infections.      Labs: Basic Metabolic Panel:  Recent Labs Lab 09/10/14 0738 09/11/14 0439  NA 142 143  K 4.1 3.9  CL 101 104  CO2 17* 28  GLUCOSE 210* 137*  BUN 17 10  CREATININE 0.96 1.34  CALCIUM 9.4 9.0   Liver Function Tests: No results for input(s): AST, ALT, ALKPHOS, BILITOT, PROT, ALBUMIN in the last 168 hours. No results for input(s): LIPASE, AMYLASE in the last 168 hours. No results for input(s): AMMONIA in the last 168 hours. CBC:  Recent Labs Lab 09/10/14 0738 09/11/14 0439  WBC 8.1 6.3  NEUTROABS 6.7  --   HGB 12.0* 10.5*  HCT 37.0* 32.0*  MCV 92.5 90.9  PLT 242 237   Cardiac Enzymes: No results for input(s): CKTOTAL, CKMB, CKMBINDEX, TROPONINI in the last 168 hours. BNP: BNP (last 3 results) No results for input(s): PROBNP in the last 8760 hours. CBG:  Recent Labs Lab 09/11/14 2035 09/12/14 0015 09/12/14 0352 09/12/14 0732 09/12/14 1146  GLUCAP 97 153* 166* 177* 291*       Signed:  Velvet Bathe  [facility] 09/12/2014, 2:29 PM

## 2014-09-12 NOTE — Plan of Care (Signed)
Problem: Discharge Progression Outcomes Goal: Discharge plan in place and appropriate Outcome: Completed/Met Date Met:  09/12/14 Goal: Pain controlled with appropriate interventions Outcome: Completed/Met Date Met:  09/12/14 Goal: Hemodynamically stable Outcome: Completed/Met Date Met:  09/12/14 Goal: Complications resolved/controlled Outcome: Completed/Met Date Met:  09/12/14 Goal: Tolerating diet Outcome: Completed/Met Date Met:  09/12/14 Goal: Activity appropriate for discharge plan Outcome: Completed/Met Date Met:  09/12/14 Goal: Other Discharge Outcomes/Goals Outcome: Completed/Met Date Met:  09/12/14     

## 2014-09-12 NOTE — Progress Notes (Signed)
Pt left at this time with his family at his side. Alert, oriented,and without c/o. Difficult discharge as pt and family do not seem to be med and diabetes compliant. Instructions reiterated and encouraged.  Noted followup appointments.

## 2014-09-12 NOTE — Plan of Care (Signed)
Problem: Phase I Progression Outcomes Goal: Pain controlled with appropriate interventions Outcome: Completed/Met Date Met:  09/12/14  Problem: Phase II Progression Outcomes Goal: Progress activity as tolerated unless otherwise ordered Outcome: Completed/Met Date Met:  09/12/14 Goal: Vital signs remain stable Outcome: Completed/Met Date Met:  09/12/14 Goal: IV changed to normal saline lock Outcome: Completed/Met Date Met:  09/12/14

## 2014-09-12 NOTE — Progress Notes (Signed)
Nutrition Brief Note  Patient identified on the Malnutrition Screening Tool (MST) Report  Pt has gained 15 lb in one month.  Wt Readings from Last 15 Encounters:  09/10/14 120 lb (54.432 kg)  09/02/14 120 lb (54.432 kg)  08/18/14 105 lb (47.628 kg)  08/02/14 95 lb 12.8 oz (43.455 kg)  07/22/14 86 lb 13.8 oz (39.4 kg)  06/23/14 99 lb (44.906 kg)  05/17/14 100 lb (45.36 kg)  05/15/14 100 lb 1.4 oz (45.4 kg)    Body mass index is 22.69 kg/(m^2). Patient meets criteria for normal range based on current BMI.   Current diet order is CHO modified, patient is consuming approximately 100% of meals and supplements at this time. Labs and medications reviewed.   No nutrition interventions warranted at this time. If nutrition issues arise, please consult RD.   Clayton Bibles, MS, RD, LDN Pager: [phone] After Hours Pager: [phone]

## 2014-09-13 ENCOUNTER — Other Ambulatory Visit: Payer: Self-pay | Admitting: *Deleted

## 2014-09-13 ENCOUNTER — Encounter: Payer: Self-pay | Admitting: Family Medicine

## 2014-09-13 ENCOUNTER — Telehealth: Payer: Self-pay | Admitting: *Deleted

## 2014-09-13 ENCOUNTER — Telehealth: Payer: Self-pay | Admitting: Family Medicine

## 2014-09-13 DIAGNOSIS — E43 Unspecified severe protein-calorie malnutrition: Secondary | ICD-10-CM | POA: Diagnosis not present

## 2014-09-13 DIAGNOSIS — Z794 Long term (current) use of insulin: Secondary | ICD-10-CM | POA: Diagnosis not present

## 2014-09-13 DIAGNOSIS — Z452 Encounter for adjustment and management of vascular access device: Secondary | ICD-10-CM | POA: Diagnosis not present

## 2014-09-13 DIAGNOSIS — E119 Type 2 diabetes mellitus without complications: Secondary | ICD-10-CM | POA: Diagnosis not present

## 2014-09-13 DIAGNOSIS — N390 Urinary tract infection, site not specified: Secondary | ICD-10-CM | POA: Diagnosis not present

## 2014-09-13 MED ORDER — LOPERAMIDE HCL 2 MG PO TABS: 2.0000 mg | ORAL_TABLET | Freq: Every day | ORAL | Status: DC

## 2014-09-13 NOTE — Telephone Encounter (Signed)
You can contact patient and ask to speak with his daughter.

## 2014-09-13 NOTE — Telephone Encounter (Signed)
Home Health Nira Conn) stated Patient BP 90/56 P 88 Pt was in the hospital with glucose 27 mg/dl   This morning was 172 mg/dl  Requested for visits to be twice per week to help patient to manage his glucose correctly

## 2014-09-13 NOTE — Telephone Encounter (Signed)
Nurse Nira Conn called stating that Mr. Paler has low BP and low blood sugar and they are hoping to take a look at his current medications. They also need clarification to know if he is still taking genuvia and to also confirm if he is taking novalin 70/30. She also wanted to state that he is out of antidiarrheal imodium and was hoping to get that refilled. Please follow up with nurse.

## 2014-09-13 NOTE — Telephone Encounter (Signed)
Nurse Nira Conn called stating that Brian Owen has low BP and low blood sugar and they are hoping to take a look at his current medications. They also need clarification to know if he is still taking genuvia and to also confirm if he is taking novalin 70/30. She also wanted to state that he is out of antidiarrheal imodium and was hoping to get that refilled. Please follow up with nurse.

## 2014-09-16 ENCOUNTER — Telehealth: Payer: Self-pay | Admitting: Family Medicine

## 2014-09-16 LAB — CULTURE, BLOOD (ROUTINE X 2)
CULTURE: NO GROWTH
Culture: NO GROWTH

## 2014-09-16 NOTE — Telephone Encounter (Signed)
Heather from [facility] calling to follow up on pending orders to extend home care and to report pt was at the hospital last week due to fluctuating blood sugar levels. Please f/u with nurse.

## 2014-09-17 DIAGNOSIS — N390 Urinary tract infection, site not specified: Secondary | ICD-10-CM | POA: Diagnosis not present

## 2014-09-17 DIAGNOSIS — E43 Unspecified severe protein-calorie malnutrition: Secondary | ICD-10-CM | POA: Diagnosis not present

## 2014-09-17 DIAGNOSIS — Z794 Long term (current) use of insulin: Secondary | ICD-10-CM | POA: Diagnosis not present

## 2014-09-17 DIAGNOSIS — E119 Type 2 diabetes mellitus without complications: Secondary | ICD-10-CM | POA: Diagnosis not present

## 2014-09-17 DIAGNOSIS — Z452 Encounter for adjustment and management of vascular access device: Secondary | ICD-10-CM | POA: Diagnosis not present

## 2014-09-17 NOTE — Telephone Encounter (Signed)
Expand All Collapse All   Heather from [facility] calling to follow up on pending orders to extend home care and to report pt was at the hospital last week due to fluctuating blood sugar levels. Please f/u with nurse.       Heather from [facility] calling to extend home visits to twice a week x 3 weeks until re-certification Please f/u

## 2014-09-17 NOTE — Telephone Encounter (Signed)
VO given to extend home health services.

## 2014-09-21 ENCOUNTER — Ambulatory Visit (INDEPENDENT_AMBULATORY_CARE_PROVIDER_SITE_OTHER): Payer: Medicare Other | Admitting: Internal Medicine

## 2014-09-21 ENCOUNTER — Encounter: Payer: Self-pay | Admitting: Internal Medicine

## 2014-09-21 VITALS — BP 142/78 | HR 96 | Temp 97.6°F | Wt 109.5 lb

## 2014-09-21 DIAGNOSIS — B9561 Methicillin susceptible Staphylococcus aureus infection as the cause of diseases classified elsewhere: Secondary | ICD-10-CM | POA: Diagnosis not present

## 2014-09-21 DIAGNOSIS — E43 Unspecified severe protein-calorie malnutrition: Secondary | ICD-10-CM | POA: Diagnosis not present

## 2014-09-21 DIAGNOSIS — R7881 Bacteremia: Secondary | ICD-10-CM | POA: Diagnosis not present

## 2014-09-21 DIAGNOSIS — Z452 Encounter for adjustment and management of vascular access device: Secondary | ICD-10-CM | POA: Diagnosis not present

## 2014-09-21 DIAGNOSIS — E119 Type 2 diabetes mellitus without complications: Secondary | ICD-10-CM | POA: Diagnosis not present

## 2014-09-21 DIAGNOSIS — Z794 Long term (current) use of insulin: Secondary | ICD-10-CM | POA: Diagnosis not present

## 2014-09-21 DIAGNOSIS — N390 Urinary tract infection, site not specified: Secondary | ICD-10-CM | POA: Diagnosis not present

## 2014-09-21 NOTE — Progress Notes (Signed)
Patient ID: Brian Owen, male   DOB: 11/01/1950, 63 y.o.   MRN: [id number]         [facility]  Patient Active Problem List   Diagnosis Date Noted  . Sepsis 09/10/2014  . SIRS (systemic inflammatory response syndrome) 09/10/2014  . Diarrhea 08/17/2014  . Hypothermia 08/02/2014  . Protein-calorie malnutrition, severe 07/26/2014  . UTI (lower urinary tract infection) 07/22/2014  . Dizziness 07/22/2014  . History of tobacco use 06/27/2014  . Edentulism, complete 06/27/2014  . Language barrier to communication 06/27/2014  . History of hypertension 06/27/2014  . Gram-positive cocci bacteremia 05/15/2014  . Normocytic anemia 05/14/2014  . Hyponatremia 05/13/2014  . Diabetes mellitus type 2, uncontrolled 05/13/2014    Patient's Medications  New Prescriptions   No medications on file  Previous Medications   INSULIN NPH-REGULAR HUMAN (NOVOLIN 70/30) (70-30) 100 UNIT/ML INJECTION    Inject 10 Units into the skin 2 (two) times daily with a meal.   LIVING WELL WITH DIABETES BOOK MISC    1 each by Does not apply route once.   LOPERAMIDE (IMODIUM A-D) 2 MG TABLET    Take 1 tablet (2 mg total) by mouth 6 (six) times daily.   NUTRITIONAL SUPPLEMENTS (ENSURE HIGH PROTEIN) POWD    Take 4 oz by mouth 2 (two) times daily between meals. 4 oz of powder mixed with 6 oz of water  Modified Medications   No medications on file  Discontinued Medications   No medications on file    Subjective:  Brian Owen  Is in for his routine follow-up visit with his daughter and the interpreter. He completed 6 weeks of IV cefazolin on November 5 for his MSSA bacteremia complicated by soft tissue infection off his thighs and a UTI. He has been feeling better. His appetite has improved and he has not had any fever, chills or sweats. Review of Systems: Pertinent items are noted in HPI.  Past Medical History  Diagnosis Date  . Diabetes mellitus without complication     History  Substance  Use Topics  . Smoking status: Former Smoker -- 40 years    Types: Cigarettes  . Smokeless tobacco: Former Systems developer    Quit date: 03/29/2014  . Alcohol Use: No    No family history on file.  No Known Allergies  Objective: Temp: 97.6 F (36.4 C) (11/24 1002) Temp Source: Oral (11/24 1002) BP: 142/78 mmHg (11/24 1002) Pulse Rate: 96 (11/24 1002)  General:  His weight is up 4 pounds to 109 Skin:  No rash Lungs:  clear Cor:  Regular S1 and S2 with no murmurs   Assessment:  I suspect that his MSSA infection has been cured.  Plan: 1.  Continue observation off of antibiotics 2.  Follow-up in one month   Michel Bickers, MD [facility] for [place] [phone] pager   [phone] cell 09/21/2014, 10:20 AM

## 2014-09-24 DIAGNOSIS — Z794 Long term (current) use of insulin: Secondary | ICD-10-CM | POA: Diagnosis not present

## 2014-09-24 DIAGNOSIS — Z452 Encounter for adjustment and management of vascular access device: Secondary | ICD-10-CM | POA: Diagnosis not present

## 2014-09-24 DIAGNOSIS — E43 Unspecified severe protein-calorie malnutrition: Secondary | ICD-10-CM | POA: Diagnosis not present

## 2014-09-24 DIAGNOSIS — N390 Urinary tract infection, site not specified: Secondary | ICD-10-CM | POA: Diagnosis not present

## 2014-09-24 DIAGNOSIS — E119 Type 2 diabetes mellitus without complications: Secondary | ICD-10-CM | POA: Diagnosis not present

## 2014-10-06 ENCOUNTER — Ambulatory Visit: Payer: Medicare Other | Admitting: *Deleted

## 2014-10-29 DIAGNOSIS — B89 Unspecified parasitic disease: Secondary | ICD-10-CM

## 2014-10-29 HISTORY — DX: Unspecified parasitic disease: B89

## 2014-11-02 ENCOUNTER — Ambulatory Visit: Payer: Medicare Other | Admitting: Internal Medicine

## 2014-11-02 ENCOUNTER — Encounter: Payer: Self-pay | Admitting: *Deleted

## 2014-11-02 NOTE — Progress Notes (Signed)
Patient ID: Brian Owen, male   DOB: 11-11-1950, 65 y.o.   MRN: [id number] Per Dr. Megan Salon OK not to reschedule pt for f/u visit.

## 2014-11-11 ENCOUNTER — Encounter (HOSPITAL_COMMUNITY): Payer: Self-pay | Admitting: Orthopedic Surgery

## 2015-03-18 ENCOUNTER — Emergency Department (HOSPITAL_COMMUNITY): Payer: Medicare Other

## 2015-03-18 ENCOUNTER — Emergency Department (HOSPITAL_COMMUNITY)
Admission: EM | Admit: 2015-03-18 | Discharge: 2015-03-19 | Disposition: A | Discharge status: Home / Self Care | Payer: Medicare Other | Attending: Emergency Medicine | Admitting: Emergency Medicine

## 2015-03-18 ENCOUNTER — Encounter (HOSPITAL_COMMUNITY): Payer: Self-pay | Admitting: Emergency Medicine

## 2015-03-18 DIAGNOSIS — H538 Other visual disturbances: Secondary | ICD-10-CM | POA: Diagnosis not present

## 2015-03-18 DIAGNOSIS — Z794 Long term (current) use of insulin: Secondary | ICD-10-CM | POA: Insufficient documentation

## 2015-03-18 DIAGNOSIS — R110 Nausea: Secondary | ICD-10-CM | POA: Insufficient documentation

## 2015-03-18 DIAGNOSIS — R21 Rash and other nonspecific skin eruption: Secondary | ICD-10-CM | POA: Insufficient documentation

## 2015-03-18 DIAGNOSIS — Z720 Tobacco use: Secondary | ICD-10-CM | POA: Insufficient documentation

## 2015-03-18 DIAGNOSIS — E119 Type 2 diabetes mellitus without complications: Secondary | ICD-10-CM | POA: Insufficient documentation

## 2015-03-18 DIAGNOSIS — R42 Dizziness and giddiness: Secondary | ICD-10-CM | POA: Diagnosis not present

## 2015-03-18 DIAGNOSIS — E1165 Type 2 diabetes mellitus with hyperglycemia: Secondary | ICD-10-CM | POA: Diagnosis not present

## 2015-03-18 HISTORY — DX: Nicotine dependence, cigarettes, uncomplicated: F17.210

## 2015-03-18 LAB — I-STAT CHEM 8, ED
BUN: 19 mg/dL (ref 6–20)
CHLORIDE: 95 mmol/L — AB (ref 101–111)
Calcium, Ion: 1.22 mmol/L (ref 1.13–1.30)
Creatinine, Ser: 1 mg/dL (ref 0.61–1.24)
Glucose, Bld: 352 mg/dL — ABNORMAL HIGH (ref 65–99)
HEMATOCRIT: 42 % (ref 39.0–52.0)
HEMOGLOBIN: 14.3 g/dL (ref 13.0–17.0)
POTASSIUM: 4 mmol/L (ref 3.5–5.1)
SODIUM: 135 mmol/L (ref 135–145)
TCO2: 29 mmol/L (ref 0–100)

## 2015-03-18 LAB — CBC WITH DIFFERENTIAL/PLATELET
Basophils Absolute: 0 10*3/uL (ref 0.0–0.1)
Basophils Relative: 0 % (ref 0–1)
EOS ABS: 0.3 10*3/uL (ref 0.0–0.7)
EOS PCT: 4 % (ref 0–5)
HCT: 39.9 % (ref 39.0–52.0)
Hemoglobin: 13.8 g/dL (ref 13.0–17.0)
Lymphocytes Relative: 39 % (ref 12–46)
Lymphs Abs: 2.9 10*3/uL (ref 0.7–4.0)
MCH: 30.8 pg (ref 26.0–34.0)
MCHC: 34.6 g/dL (ref 30.0–36.0)
MCV: 89.1 fL (ref 78.0–100.0)
MONOS PCT: 5 % (ref 3–12)
Monocytes Absolute: 0.4 10*3/uL (ref 0.1–1.0)
Neutro Abs: 3.8 10*3/uL (ref 1.7–7.7)
Neutrophils Relative %: 52 % (ref 43–77)
PLATELETS: 209 10*3/uL (ref 150–400)
RBC: 4.48 MIL/uL (ref 4.22–5.81)
RDW: 13.1 % (ref 11.5–15.5)
WBC: 7.4 10*3/uL (ref 4.0–10.5)

## 2015-03-18 LAB — URINALYSIS, ROUTINE W REFLEX MICROSCOPIC
BILIRUBIN URINE: NEGATIVE
HGB URINE DIPSTICK: NEGATIVE
Ketones, ur: NEGATIVE mg/dL
Leukocytes, UA: NEGATIVE
Nitrite: NEGATIVE
Protein, ur: NEGATIVE mg/dL
SPECIFIC GRAVITY, URINE: 1.027 (ref 1.005–1.030)
UROBILINOGEN UA: 0.2 mg/dL (ref 0.0–1.0)
pH: 6 (ref 5.0–8.0)

## 2015-03-18 LAB — COMPREHENSIVE METABOLIC PANEL
ALK PHOS: 142 U/L — AB (ref 38–126)
ALT: 44 U/L (ref 17–63)
AST: 31 U/L (ref 15–41)
Albumin: 4.3 g/dL (ref 3.5–5.0)
Anion gap: 10 (ref 5–15)
BUN: 16 mg/dL (ref 6–20)
CO2: 30 mmol/L (ref 22–32)
Calcium: 9.9 mg/dL (ref 8.9–10.3)
Chloride: 93 mmol/L — ABNORMAL LOW (ref 101–111)
Creatinine, Ser: 1.14 mg/dL (ref 0.61–1.24)
GFR calc Af Amer: >60 mL/min (ref >60)
Glucose, Bld: 462 mg/dL — ABNORMAL HIGH (ref 65–99)
Potassium: 3.9 mmol/L (ref 3.5–5.1)
Sodium: 133 mmol/L — ABNORMAL LOW (ref 135–145)
TOTAL PROTEIN: 8.4 g/dL — AB (ref 6.5–8.1)
Total Bilirubin: 0.4 mg/dL (ref 0.3–1.2)

## 2015-03-18 LAB — URINE MICROSCOPIC-ADD ON

## 2015-03-18 NOTE — ED Provider Notes (Addendum)
CSN: [id number]     Arrival date & time 03/18/15  1919 History   First MD Initiated Contact with Patient 03/18/15 2106     Chief Complaint  Patient presents with  . Dizziness    Patient speaks very little [place]. History is obtained from his adult daughter acting as interpreter. He was offered a Lawyer interpreter which he declined (Consider location/radiation/quality/duration/timing/severity/associated sxs/prior Treatment) HPI Patient developed dizziness sensation of room spinning onset 7 PM tonight. Symptoms accompanied by blurred vision and nausea. Symptoms have since resolved. He is presently asymptomatic. No other associated symptoms. No treatment prior to coming here. Symptoms worse with changing positions improved with remaining still. Past Medical History  Diagnosis Date  . Diabetes mellitus without complication   . Heavy cigarette smoker    Past Surgical History  Procedure Laterality Date  . No past surgeries     No family history on file. History  Substance Use Topics  . Smoking status: Current Every Day Smoker -- 40 years    Types: Cigarettes  . Smokeless tobacco: Former Systems developer    Quit date: 03/29/2014  . Alcohol Use: No    Review of Systems  Constitutional: Negative.   HENT: Negative.   Eyes: Positive for visual disturbance.  Respiratory: Negative.   Cardiovascular: Negative.   Gastrointestinal: Negative.   Musculoskeletal: Negative.   Skin: Positive for rash.       Pruritic rash for 2 weeks  Allergic/Immunologic: Positive for immunocompromised state.       Diabetic  Neurological: Positive for dizziness.  Psychiatric/Behavioral: Negative.   All other systems reviewed and are negative.     Allergies  Review of patient's allergies indicates no known allergies.  Home Medications   Prior to Admission medications   Medication Sig Start Date End Date Taking? Authorizing Provider  insulin NPH-regular Human (NOVOLIN 70/30) (70-30) 100 UNIT/ML  injection Inject 10 Units into the skin 2 (two) times daily with a meal. Patient not taking: Reported on 03/18/2015 09/12/14   Velvet Bathe, MD  living well with diabetes book MISC 1 each by Does not apply route once. Patient not taking: Reported on 03/18/2015 09/12/14   Velvet Bathe, MD  loperamide (IMODIUM A-D) 2 MG tablet Take 1 tablet (2 mg total) by mouth 6 (six) times daily. Patient not taking: Reported on 09/21/2014 09/13/14   Boykin Nearing, MD  Nutritional Supplements (ENSURE HIGH PROTEIN) POWD Take 4 oz by mouth 2 (two) times daily between meals. 4 oz of powder mixed with 6 oz of water Patient not taking: Reported on 09/21/2014 08/02/14   Josalyn [name], MD   BP 165/87 mmHg  Pulse 82  Temp(Src) 97.6 F (36.4 C) (Oral)  Resp 16  Wt 105 lb (47.628 kg)  SpO2 100% Physical Exam  Constitutional: He is oriented to person, place, and time. He appears well-developed.  Cachectic  HENT:  Head: Normocephalic and atraumatic.  Eyes: Conjunctivae are normal. Pupils are equal, round, and reactive to light.  Neck: Neck supple. No tracheal deviation present. No thyromegaly present.  No bruit  Cardiovascular: Normal rate and regular rhythm.   No murmur heard. Pulmonary/Chest: Effort normal and breath sounds normal.  Abdominal: Soft. Bowel sounds are normal. He exhibits no distension. There is no tenderness.  Musculoskeletal: Normal range of motion. He exhibits no edema or tenderness.  Neurological: He is alert and oriented to person, place, and time. He displays normal reflexes. No cranial nerve deficit. Coordination normal.  Gait normal Romberg normal pronator drift normal DTR  symmetric bilaterally knee jerk ankle jerk biceps toes downward going bilaterally not lightheaded on standing  Skin: Skin is warm and dry. Rash noted.  Pruritic pinkish rash on upper back and extremities  Psychiatric: He has a normal mood and affect.  Nursing note and vitals reviewed.   ED Course  Procedures  (including critical care time) Labs Review Labs Reviewed  COMPREHENSIVE METABOLIC PANEL - Abnormal; Notable for the following:    Sodium 133 (*)    Chloride 93 (*)    Glucose, Bld 462 (*)    Total Protein 8.4 (*)    Alkaline Phosphatase 142 (*)    All other components within normal limits  URINALYSIS, ROUTINE W REFLEX MICROSCOPIC - Abnormal; Notable for the following:    Glucose, UA >1000 (*)    All other components within normal limits  CBC WITH DIFFERENTIAL/PLATELET  URINE MICROSCOPIC-ADD ON    Imaging Review No results found.   EKG Interpretation   Date/Time:  Friday Mar 18 2015 19:37:35 EDT Ventricular Rate:  103 PR Interval:  138 QRS Duration: 82 QT Interval:  350 QTC Calculation: 458 R Axis:   53 Text Interpretation:  Sinus tachycardia Otherwise normal ECG No  significant change since last tracing Confirmed by Bless Belshe  MD, Stevie Charter  ([id number]) on 03/18/2015 9:44:31 PM     12:30 AM patient resting comfortably. States he feels improved since in the emergency department. Results for orders placed or performed during the hospital encounter of 03/18/15  CBC with Differential  Result Value Ref Range   WBC 7.4 4.0 - 10.5 K/uL   RBC 4.48 4.22 - 5.81 MIL/uL   Hemoglobin 13.8 13.0 - 17.0 g/dL   HCT 39.9 39.0 - 52.0 %   MCV 89.1 78.0 - 100.0 fL   MCH 30.8 26.0 - 34.0 pg   MCHC 34.6 30.0 - 36.0 g/dL   RDW 13.1 11.5 - 15.5 %   Platelets 209 150 - 400 K/uL   Neutrophils Relative % 52 43 - 77 %   Neutro Abs 3.8 1.7 - 7.7 K/uL   Lymphocytes Relative 39 12 - 46 %   Lymphs Abs 2.9 0.7 - 4.0 K/uL   Monocytes Relative 5 3 - 12 %   Monocytes Absolute 0.4 0.1 - 1.0 K/uL   Eosinophils Relative 4 0 - 5 %   Eosinophils Absolute 0.3 0.0 - 0.7 K/uL   Basophils Relative 0 0 - 1 %   Basophils Absolute 0.0 0.0 - 0.1 K/uL  Comprehensive metabolic panel  Result Value Ref Range   Sodium 133 (L) 135 - 145 mmol/L   Potassium 3.9 3.5 - 5.1 mmol/L   Chloride 93 (L) 101 - 111 mmol/L   CO2 30  22 - 32 mmol/L   Glucose, Bld 462 (H) 65 - 99 mg/dL   BUN 16 6 - 20 mg/dL   Creatinine, Ser 1.14 0.61 - 1.24 mg/dL   Calcium 9.9 8.9 - 10.3 mg/dL   Total Protein 8.4 (H) 6.5 - 8.1 g/dL   Albumin 4.3 3.5 - 5.0 g/dL   AST 31 15 - 41 U/L   ALT 44 17 - 63 U/L   Alkaline Phosphatase 142 (H) 38 - 126 U/L   Total Bilirubin 0.4 0.3 - 1.2 mg/dL   GFR calc non Af Amer >60 >60 mL/min   GFR calc Af Amer >60 >60 mL/min   Anion gap 10 5 - 15  Urinalysis, Routine w reflex microscopic  Result Value Ref Range   Color, Urine YELLOW  YELLOW   APPearance CLEAR CLEAR   Specific Gravity, Urine 1.027 1.005 - 1.030   pH 6.0 5.0 - 8.0   Glucose, UA >1000 (A) NEGATIVE mg/dL   Hgb urine dipstick NEGATIVE NEGATIVE   Bilirubin Urine NEGATIVE NEGATIVE   Ketones, ur NEGATIVE NEGATIVE mg/dL   Protein, ur NEGATIVE NEGATIVE mg/dL   Urobilinogen, UA 0.2 0.0 - 1.0 mg/dL   Nitrite NEGATIVE NEGATIVE   Leukocytes, UA NEGATIVE NEGATIVE  Urine microscopic-add on  Result Value Ref Range   Squamous Epithelial / LPF RARE RARE   WBC, UA 3-6 <3 WBC/hpf   RBC / HPF 0-2 <3 RBC/hpf  I-Stat Chem 8, ED  Result Value Ref Range   Sodium 135 135 - 145 mmol/L   Potassium 4.0 3.5 - 5.1 mmol/L   Chloride 95 (L) 101 - 111 mmol/L   BUN 19 6 - 20 mg/dL   Creatinine, Ser 1.00 0.61 - 1.24 mg/dL   Glucose, Bld 352 (H) 65 - 99 mg/dL   Calcium, Ion 1.22 1.13 - 1.30 mmol/L   TCO2 29 0 - 100 mmol/L   Hemoglobin 14.3 13.0 - 17.0 g/dL   HCT 42.0 39.0 - 52.0 %   Mr Brain Wo Contrast  03/19/2015   CLINICAL DATA:  Dizziness beginning this evening. History of diabetes, smoking.  EXAM: MRI HEAD WITHOUT CONTRAST  TECHNIQUE: Multiplanar, multiecho pulse sequences of the brain and surrounding structures were obtained without intravenous contrast.  COMPARISON:  CT of the head and cervical spine May 10, 2014  FINDINGS: The ventricles and sulci are upper limits of normal for for patient's age. No abnormal parenchymal signal, mass lesions, mass  effect. No reduced diffusion to suggest acute ischemia. No susceptibility artifact to suggest hemorrhage. Patchy supratentorial white matter FLAIR T2 hyperintensities. Tiny T2 hyperintensities within the basal ganglia and thalamus likely represent perivascular spaces.  No abnormal extra-axial fluid collections. No extra-axial masses though, contrast enhanced sequences would be more sensitive. Normal major intracranial vascular flow voids seen at the skull base. Dolicoectatic appearance of the intracranial vessels compatible with chronic hypertension.  Ocular globes and orbital contents are unremarkable though not tailored for evaluation. No abnormal sellar expansion. Moderate chronic LEFT maxillary sinusitis again noted. Moderate ethmoid mucosal thickening. The mastoid air cells are well aerated. No suspicious calvarial bone marrow signal. No abnormal sellar expansion. Craniocervical junction maintained.  IMPRESSION: No acute intracranial process, specifically no acute ischemia.  Borderline parenchymal brain volume loss for age. Moderate white matter changes suggest chronic small vessel ischemic disease.  Moderate paranasal sinusitis.   Electronically Signed   By: Elon Alas   On: 03/19/2015 00:11    MDM  MRI of brain ordered to check for posterior circulation stroke based on risk factors of smoking, diabetes and age. Patient had vague visual complaints with vertigo Final diagnoses:  None   further history. His daughter reports that he's run out of his insulin several weeks ago. I've written prescription for insulin refill as well as insulin syringes. He is to follow-up with [facility] on Monday, 03/21/2015. He'll receive intravenous fluid bolus prior to discharge for hyperglycemia. Dr. Kirke Corin to recheck glucose prior to discharge. Rash felt to be nonspecific Diagnoses #1 dizziness #2 hyperglycemia #3 medication noncompliance #4 rash     Orlie Dakin, MD 03/19/15  [id number]  Orlie Dakin, MD 03/19/15 [id number]

## 2015-03-18 NOTE — ED Notes (Signed)
Pt. reports dizziness onset this evening , alert and oriented at arrival , speech clear / no facial asymmetry , no arm drift /ambulatory.

## 2015-03-19 LAB — CBG MONITORING, ED: Glucose-Capillary: 261 mg/dL — ABNORMAL HIGH (ref 65–99)

## 2015-03-19 MED ORDER — SODIUM CHLORIDE 0.9 % IV BOLUS (SEPSIS)
2000.0000 mL | Freq: Once | INTRAVENOUS | Status: AC
  Administered 2015-03-19: 2000 mL via INTRAVENOUS

## 2015-03-19 MED ORDER — INSULIN NPH ISOPHANE & REGULAR (70-30) 100 UNIT/ML ~~LOC~~ SUSP: 10.0000 [IU] | Freq: Two times a day (BID) | SUBCUTANEOUS | Status: DC

## 2015-03-19 MED ORDER — "INSULIN SYRINGE 28G X 1/2"" 0.5 ML MISC": 50.0000 | Freq: Two times a day (BID) | Status: DC

## 2015-03-19 NOTE — Discharge Instructions (Signed)
Take your insulin as prescribed. Call the [facility] on Monday, 03/21/2015 to schedule next available follow-up appointment

## 2015-04-20 ENCOUNTER — Encounter (HOSPITAL_COMMUNITY): Payer: Self-pay | Admitting: *Deleted

## 2015-04-20 ENCOUNTER — Emergency Department (HOSPITAL_COMMUNITY): Payer: Medicare Other

## 2015-04-20 ENCOUNTER — Emergency Department (HOSPITAL_COMMUNITY)
Admission: EM | Admit: 2015-04-20 | Discharge: 2015-04-21 | Disposition: A | Discharge status: Home / Self Care | Payer: Medicare Other | Attending: Emergency Medicine | Admitting: Emergency Medicine

## 2015-04-20 DIAGNOSIS — R5383 Other fatigue: Secondary | ICD-10-CM

## 2015-04-20 DIAGNOSIS — E1165 Type 2 diabetes mellitus with hyperglycemia: Secondary | ICD-10-CM | POA: Insufficient documentation

## 2015-04-20 DIAGNOSIS — Z794 Long term (current) use of insulin: Secondary | ICD-10-CM | POA: Insufficient documentation

## 2015-04-20 DIAGNOSIS — R42 Dizziness and giddiness: Secondary | ICD-10-CM

## 2015-04-20 DIAGNOSIS — Z720 Tobacco use: Secondary | ICD-10-CM | POA: Diagnosis not present

## 2015-04-20 DIAGNOSIS — I951 Orthostatic hypotension: Secondary | ICD-10-CM | POA: Insufficient documentation

## 2015-04-20 DIAGNOSIS — R51 Headache: Secondary | ICD-10-CM | POA: Diagnosis not present

## 2015-04-20 LAB — CBC WITH DIFFERENTIAL/PLATELET
BASOS ABS: 0 10*3/uL (ref 0.0–0.1)
BASOS PCT: 1 % (ref 0–1)
EOS ABS: 0.3 10*3/uL (ref 0.0–0.7)
Eosinophils Relative: 5 % (ref 0–5)
HCT: 35.9 % — ABNORMAL LOW (ref 39.0–52.0)
Hemoglobin: 12.3 g/dL — ABNORMAL LOW (ref 13.0–17.0)
Lymphocytes Relative: 47 % — ABNORMAL HIGH (ref 12–46)
Lymphs Abs: 3.1 10*3/uL (ref 0.7–4.0)
MCH: 30.8 pg (ref 26.0–34.0)
MCHC: 34.3 g/dL (ref 30.0–36.0)
MCV: 90 fL (ref 78.0–100.0)
Monocytes Absolute: 0.4 10*3/uL (ref 0.1–1.0)
Monocytes Relative: 7 % (ref 3–12)
NEUTROS ABS: 2.5 10*3/uL (ref 1.7–7.7)
NEUTROS PCT: 40 % — AB (ref 43–77)
PLATELETS: 194 10*3/uL (ref 150–400)
RBC: 3.99 MIL/uL — ABNORMAL LOW (ref 4.22–5.81)
RDW: 13.3 % (ref 11.5–15.5)
WBC: 6.3 10*3/uL (ref 4.0–10.5)

## 2015-04-20 LAB — COMPREHENSIVE METABOLIC PANEL
ALT: 48 U/L (ref 17–63)
AST: 23 U/L (ref 15–41)
Albumin: 3.9 g/dL (ref 3.5–5.0)
Alkaline Phosphatase: 118 U/L (ref 38–126)
Anion gap: 8 (ref 5–15)
BUN: 13 mg/dL (ref 6–20)
CHLORIDE: 100 mmol/L — AB (ref 101–111)
CO2: 30 mmol/L (ref 22–32)
Calcium: 10.2 mg/dL (ref 8.9–10.3)
Creatinine, Ser: 1.06 mg/dL (ref 0.61–1.24)
GFR calc Af Amer: >60 mL/min (ref >60)
Glucose, Bld: 116 mg/dL — ABNORMAL HIGH (ref 65–99)
POTASSIUM: 3.4 mmol/L — AB (ref 3.5–5.1)
Sodium: 138 mmol/L (ref 135–145)
Total Bilirubin: 0.4 mg/dL (ref 0.3–1.2)
Total Protein: 7.5 g/dL (ref 6.5–8.1)

## 2015-04-20 LAB — URINE MICROSCOPIC-ADD ON

## 2015-04-20 LAB — I-STAT TROPONIN, ED: TROPONIN I, POC: 0 ng/mL (ref 0.00–0.08)

## 2015-04-20 LAB — URINALYSIS, ROUTINE W REFLEX MICROSCOPIC
BILIRUBIN URINE: NEGATIVE
Hgb urine dipstick: NEGATIVE
Ketones, ur: NEGATIVE mg/dL
LEUKOCYTES UA: NEGATIVE
Nitrite: NEGATIVE
Protein, ur: NEGATIVE mg/dL
Specific Gravity, Urine: 1.022 (ref 1.005–1.030)
Urobilinogen, UA: 0.2 mg/dL (ref 0.0–1.0)
pH: 5.5 (ref 5.0–8.0)

## 2015-04-20 LAB — CBG MONITORING, ED: GLUCOSE-CAPILLARY: 118 mg/dL — AB (ref 65–99)

## 2015-04-20 LAB — I-STAT CG4 LACTIC ACID, ED: Lactic Acid, Venous: 1.95 mmol/L (ref 0.5–2.0)

## 2015-04-20 MED ORDER — PIPERACILLIN-TAZOBACTAM 3.375 G IVPB 30 MIN
3.3750 g | Freq: Once | INTRAVENOUS | Status: AC
  Administered 2015-04-20: 3.375 g via INTRAVENOUS
  Filled 2015-04-20: qty 50

## 2015-04-20 MED ORDER — VANCOMYCIN HCL IN DEXTROSE 1-5 GM/200ML-% IV SOLN: 1000.0000 mg | Freq: Once | INTRAVENOUS | Status: DC

## 2015-04-20 MED ORDER — SODIUM CHLORIDE 0.9 % IV BOLUS (SEPSIS)
500.0000 mL | INTRAVENOUS | Status: AC
  Administered 2015-04-20: 500 mL via INTRAVENOUS

## 2015-04-20 MED ORDER — VANCOMYCIN HCL IN DEXTROSE 750-5 MG/150ML-% IV SOLN
750.0000 mg | INTRAVENOUS | Status: AC
  Administered 2015-04-20: 750 mg via INTRAVENOUS
  Filled 2015-04-20: qty 150

## 2015-04-20 MED ORDER — SODIUM CHLORIDE 0.9 % IV SOLN
1000.0000 mL | INTRAVENOUS | Status: DC
  Administered 2015-04-20: 1000 mL via INTRAVENOUS

## 2015-04-20 MED ORDER — SODIUM CHLORIDE 0.9 % IV BOLUS (SEPSIS)
1000.0000 mL | Freq: Once | INTRAVENOUS | Status: AC
  Administered 2015-04-20: 1000 mL via INTRAVENOUS

## 2015-04-20 NOTE — ED Notes (Addendum)
Pt reports headache and dizziness starting around 1800. Pt is hypotensive with SBP in the 80's. BP check multiple times in both arms and consistently remained in the 80's. Pt denies any LOC today. Pt states he has been eating and drinking ok today. Pt reports weakness and fatigue today. Pt denies n/v, or rectal bleeding

## 2015-04-20 NOTE — ED Provider Notes (Signed)
CSN: [id number]     Arrival date & time 04/20/15  2125 History   First MD Initiated Contact with Patient 04/20/15 2226     Chief Complaint  Patient presents with  . Dizziness  . Headache  . Fatigue     (Consider location/radiation/quality/duration/timing/severity/associated sxs/prior Treatment) HPI Comments: Brian Owen is a 64 y.o. male with a PMHx of DM2 and tobacco use, who presents to the ED with complaints of lightheadedness with standing and generalized fatigue/weakness. History limited due to language barrier, language line used but history taking still difficult. He reports that his lightheadedness is only with standing, and resolves with laying down, and has been ongoing today. His family member at bedside reports that he ran out of his medicines and his blood glucose monitoring strips she believes this may be the etiology of his symptoms. He denies a feeling of vertigo or spinning. He denies any fevers or chills, chest pain, shortness of breath, cough, abdominal pain, nausea, vomiting, diarrhea, constipation, melanotic, hematochezia, dysuria, hematuria, numbness, tingling, focal weakness, headache, vision changes, syncope, hearing loss, or tinnitus. He reports chronic and unchanged blurry vision which is been ongoing for many years. His PCP is Brian Owen.   BP upon arrival noted to be 80/56. Chart review reveals that pt has had UTI sepsis/bacteremia last year and in November of 2015 he came in to the ER with SIRS/sepsis criteria very similar to today's presentation.  Patient is a 63 y.o. male presenting with dizziness and headaches. The history is provided by the patient. The history is limited by a language barrier. A language interpreter was used.  Dizziness Quality:  Lightheadedness Severity:  Mild Onset quality:  Unable to specify Timing:  Intermittent (only with standing) Progression:  Unchanged Chronicity:  New Context: standing up   Relieved by:  Lying down Worsened by:   Standing up Ineffective treatments:  None tried Associated symptoms: no blood in stool, no chest pain, no diarrhea, no headaches, no hearing loss, no nausea, no shortness of breath, no syncope, no tinnitus, no vision changes, no vomiting and no weakness   Headache Associated symptoms: dizziness and fatigue   Associated symptoms: no abdominal pain, no cough, no diarrhea, no ear pain, no fever, no hearing loss, no myalgias, no nausea, no neck pain, no numbness, no syncope, no visual change, no vomiting and no weakness     Past Medical History  Diagnosis Date  . Diabetes mellitus without complication   . Heavy cigarette smoker    Past Surgical History  Procedure Laterality Date  . No past surgeries     History reviewed. No pertinent family history. History  Substance Use Topics  . Smoking status: Current Every Day Smoker -- 40 years    Types: Cigarettes  . Smokeless tobacco: Former Systems developer    Quit date: 03/29/2014  . Alcohol Use: No    Review of Systems  Constitutional: Positive for fatigue. Negative for fever and chills.  HENT: Negative for ear discharge, ear pain, hearing loss and tinnitus.   Eyes: Negative for visual disturbance (chronic blurry vision unchanged from baseline, no other vision deficits).  Respiratory: Negative for cough and shortness of breath.   Cardiovascular: Negative for chest pain and syncope.  Gastrointestinal: Negative for nausea, vomiting, abdominal pain, diarrhea, constipation and blood in stool.  Genitourinary: Negative for dysuria and hematuria.  Musculoskeletal: Negative for myalgias, arthralgias and neck pain.  Skin: Negative for rash.  Allergic/Immunologic: Positive for immunocompromised state (diabetic).  Neurological: Positive for dizziness and light-headedness (  with standing). Negative for syncope, weakness, numbness and headaches.  Psychiatric/Behavioral: Negative for confusion.   10 Systems reviewed and are negative for acute change except as  noted in the HPI.    Allergies  Review of patient's allergies indicates no known allergies.  Home Medications   Prior to Admission medications   Medication Sig Start Date End Date Taking? Authorizing Provider  insulin NPH-regular Human (NOVOLIN 70/30) (70-30) 100 UNIT/ML injection Inject 10 Units into the skin 2 (two) times daily with a meal. 03/19/15  Yes Brian Winfred Leeds, MD   BP 87/63 mmHg  Pulse 107  Temp(Src) 97.4 F (36.3 C) (Oral)  Resp 16  SpO2 96% Physical Exam  Constitutional: He is oriented to person, place, and time. He appears well-developed and well-nourished.  Non-toxic appearance. No distress.  Afebrile, nontoxic, NAD, very thin frail appearing male  HENT:  Head: Normocephalic and atraumatic.  Mouth/Throat: Oropharynx is clear and moist and mucous membranes are normal.  Eyes: Conjunctivae and EOM are normal. Pupils are equal, round, and reactive to light. Right eye exhibits no discharge. Left eye exhibits no discharge.  PERRL, EOMI, no nystagmus, no visual field deficits   Neck: Normal range of motion. Neck supple.  No meningismus  Cardiovascular: Normal rate, regular rhythm, normal heart sounds and intact distal pulses.  Exam reveals no gallop and no friction rub.   No murmur heard. Mildly tachycardic upon arrival, which resolved during exam but then returned when he went from laying to sitting, reg rhythm, nl s1/s2, no m/r/g, distal pulses intact, no pedal edema  Pulmonary/Chest: Effort normal and breath sounds normal. No respiratory distress. He has no decreased breath sounds. He has no wheezes. He has no rhonchi. He has no rales.  CTAB in all lung fields, no w/r/r, no hypoxia or increased WOB, speaking in full sentences, SpO2 96% on RA   Abdominal: Soft. Normal appearance and bowel sounds are normal. He exhibits no distension. There is no tenderness. There is no rigidity, no rebound, no guarding, no CVA tenderness, no tenderness at McBurney's point and negative  Murphy's sign.  Soft, NTND, +BS throughout, no r/g/r, neg murphy's, neg mcburney's, no CVA TTP   Musculoskeletal: Normal range of motion.  MAE x4 Strength and sensation grossly intact Distal pulses intact No pedal edema, neg homan's bilaterally   Neurological: He is alert and oriented to person, place, and time. He has normal strength. No cranial nerve deficit or sensory deficit. Coordination and gait normal. GCS eye subscore is 4. GCS verbal subscore is 5. GCS motor subscore is 6.  CN 2-12 grossly intact A&O x4 GCS 15 Sensation and strength intact Gait steady using cane Coordination WNL Neg pronator drift   Skin: Skin is warm, dry and intact. No rash noted.  Psychiatric: He has a normal mood and affect.  Nursing note and vitals reviewed.   ED Course  Procedures (including critical care time)  CRITICAL CARE- sepsis criteria/hypotensive Performed by: Corine Shelter  Total critical care time: 30 minutes  Critical care time was exclusive of separately billable procedures and treating other patients.  Critical care was necessary to treat or prevent imminent or life-threatening deterioration.  Critical care was time spent personally by me on the following activities: development of treatment plan with patient and/or surrogate as well as nursing, discussions with consultants, evaluation of patient's response to treatment, examination of patient, obtaining history from patient or surrogate, ordering and performing treatments and interventions, ordering and review of laboratory studies, ordering and review  of radiographic studies, pulse oximetry and re-evaluation of patient's condition.   Orthostatic VS (after 1.5L fluids): 00:36:53 Vital Signs RS  Vital Signs - Pulse Rate: 84 ; Resp: 18 ; BP: 148/81 mmHg ; BP Location: Right Arm ; BP Method: Automatic ; Patient Position (if appropriate): Lying  Oxygen Therapy - SpO2: 100 % ; O2 Device: Room Air  Pain Assessment - Pain  Assessment: No/denies pain  Vital Signs - Pulse Rate: 83 ; BP: 140/84 mmHg ; BP Location: Right Arm ; BP Method: Automatic ; Patient Position (if appropriate): Sitting  Vital Signs - Pulse Rate: 84 ; Pulse Rate Source: Monitor ; Resp: 16 ; BP: 127/74 mmHg ; BP Location: Right Arm ; BP Method: Automatic ; Patient Position (if appropriate): Standing  Oxygen Therapy - SpO2: 100 % ; O2 Device: Room Air       Labs Review Labs Reviewed  CBC WITH DIFFERENTIAL/PLATELET - Abnormal; Notable for the following:    RBC 3.99 (*)    Hemoglobin 12.3 (*)    HCT 35.9 (*)    Neutrophils Relative % 40 (*)    Lymphocytes Relative 47 (*)    All other components within normal limits  COMPREHENSIVE METABOLIC PANEL - Abnormal; Notable for the following:    Potassium 3.4 (*)    Chloride 100 (*)    Glucose, Bld 116 (*)    All other components within normal limits  URINALYSIS, ROUTINE W REFLEX MICROSCOPIC (NOT AT [facility]) - Abnormal; Notable for the following:    Glucose, UA >1000 (*)    All other components within normal limits  CBG MONITORING, ED - Abnormal; Notable for the following:    Glucose-Capillary 118 (*)    All other components within normal limits  CBG MONITORING, ED - Abnormal; Notable for the following:    Glucose-Capillary 101 (*)    All other components within normal limits  CULTURE, BLOOD (ROUTINE X 2)  CULTURE, BLOOD (ROUTINE X 2)  URINE CULTURE  URINE MICROSCOPIC-ADD ON  I-STAT CG4 LACTIC ACID, ED  POC OCCULT BLOOD, ED  Randolm Idol, ED    Imaging Review Dg Chest 2 View  04/21/2015   CLINICAL DATA:  Patient with dizziness, headache and fatigue for 1 day.  EXAM: CHEST  2 VIEW  COMPARISON:  Chest radiograph 09/10/2014  FINDINGS: Stable cardiac and mediastinal contours. No consolidative pulmonary opacities. No pleural effusion or pneumothorax. Lateral view limited secondary to patient positioning and overlying soft tissue.  IMPRESSION: No acute cardiopulmonary process.   Electronically  Signed   By: Lovey Newcomer M.D.   On: 04/21/2015 00:16     EKG Interpretation   Date/Time:  Thursday April 21 2015 00:41:46 EDT Ventricular Rate:  82 PR Interval:  142 QRS Duration: 92 QT Interval:  393 QTC Calculation: 459 R Axis:   76 Text Interpretation:  Sinus rhythm Minimal ST elevation, lateral leads  Since last tracing rate slower Confirmed by OTTER  MD, OLGA ([id number]) on  04/21/2015 12:48:34 AM      MDM   Final diagnoses:  Orthostatic lightheadedness  Hyperglycemia due to type 2 diabetes mellitus  Other fatigue    64 y.o. male here with hypotension and lightheadedness with standing. History difficult due to language barrier, translator line used but still very difficult. Pt denies HA or pain anywhere, denies n/v/d/melena/hematochezia. Hypotensive on arrival, 80s/50-60s, unclear etiology. Mildly tachycardic on triage vitals, during exam HR improved but when he goes from laying to sitting it increases, concerning for orthostasis as etiology  of his lightheadedness. Given his tachycardia and hypotension, code sepsis called, although unclear source. Will get rectal temp and occult blood card performed. Will obtain labs, EKG, CXR, u/a, and start fluids and abx per sepsis protocol. Will get orthostatic VS. CBG on arrival 118. Will monitor.  12:36 AM Lactic acid WNL, trop neg, CBC showing stable anemia without leukocytosis, CMP with K 3.4 but otherwise WNL, U/A unremarkable aside from glucosuria. CXR WNL. Repeat rectal temp same as oral temp. Still awaiting FOBT, EKG, and orthostatic VS, but pt has received 1.5L fluids total and is normotensive. Interestingly, it appears as though the BP readings of 80/50s were only in triage, then in the room they were 100/66, 93/61, then 126/73 all which were performed prior to fluids being started. This raises the suspicion for erroneous BP readings to start with. Unfortunately this was only found out after pt had sepsis work up performed and fluids  started. Based on this information, doubt need for admission. Pt reports he feels fine now, and daughter at bedside states that all they came for was because he ran out of his novolin and needles/strips.   12:52 AM Orthostatic VS showing SBP decrease of 21 from laying to standing, but pt no longer symptomatic. Symptoms could have been from orthostasis, but given consistently adequate BP readings here and no longer symptomatic I feel he is safe for d/c. EKG unchanged from prior. FOBT pending but since pt is not anemic and BP readings may have been erroneous, doubt need to await this result prior to d/c. Pt will be given refills of his meds, and instructed to f/up with PCP in 1wk. Discussed importance of good hydration and compliance with meds. I explained the diagnosis and have given explicit precautions to return to the ER including for any other new or worsening symptoms. The patient understands and accepts the medical plan as it's been dictated and I have answered their questions. Discharge instructions concerning home care and prescriptions have been given. The patient is STABLE and is discharged to home in good condition.  BP 127/74 mmHg  Pulse 84  Temp(Src) 97.6 F (36.4 C) (Rectal)  Resp 16  Wt 115 lb (52.164 kg)  SpO2 100%  Meds ordered this encounter  Medications  . 0.9 %  sodium chloride infusion    Sig:   . sodium chloride 0.9 % bolus 1,000 mL    Sig:     Order Specific Question:  Enter Patient Weight in Kilograms    Answer:  48   Followed by  . sodium chloride 0.9 % bolus 500 mL    Sig:     Order Specific Question:  Enter Patient Weight in Kilograms    Answer:  48  . piperacillin-tazobactam (ZOSYN) IVPB 3.375 g    Sig:     Order Specific Question:  Antibiotic Indication:    Answer:  Sepsis  . DISCONTD: vancomycin (VANCOCIN) IVPB 1000 mg/200 mL premix    Sig:     Order Specific Question:  Indication:    Answer:  Sepsis  . vancomycin (VANCOCIN) IVPB 750 mg/150 ml premix     Sig:     Order Specific Question:  Indication:    Answer:  Sepsis  . insulin NPH-regular Human (NOVOLIN 70/30) (70-30) 100 UNIT/ML injection    Sig: Inject 10 Units into the skin 2 (two) times daily with a meal.    Dispense:  10 mL    Refill:  11    Order Specific Question:  Supervising Provider    Answer:  Noemi Chapel [[id number]]  . glucose blood test strip    Sig: Use as instructed    Dispense:  100 each    Refill:  0    Order Specific Question:  Supervising Provider    Answer:  Noemi Chapel [[id number]]      Brittney Caraway Camprubi-Soms, PA-C 04/21/15 [place], PA-C 04/21/15 [id number]

## 2015-04-21 LAB — CBG MONITORING, ED: Glucose-Capillary: 101 mg/dL — ABNORMAL HIGH (ref 65–99)

## 2015-04-21 LAB — POC OCCULT BLOOD, ED: FECAL OCCULT BLD: NEGATIVE

## 2015-04-21 MED ORDER — GLUCOSE BLOOD VI STRP: ORAL_STRIP | Status: DC

## 2015-04-21 MED ORDER — INSULIN NPH ISOPHANE & REGULAR (70-30) 100 UNIT/ML ~~LOC~~ SUSP: 10.0000 [IU] | Freq: Two times a day (BID) | SUBCUTANEOUS | Status: DC

## 2015-04-21 NOTE — ED Provider Notes (Signed)
Medical screening examination/treatment/procedure(s) were conducted as a shared visit with non-physician practitioner(s) and myself.  I personally evaluated the patient during the encounter.   EKG Interpretation   Date/Time:  Thursday April 21 2015 00:41:46 EDT Ventricular Rate:  82 PR Interval:  142 QRS Duration: 92 QT Interval:  393 QTC Calculation: 459 R Axis:   76 Text Interpretation:  Sinus rhythm Minimal ST elevation, lateral leads  Since last tracing rate slower Confirmed by OTTER  MD, OLGA ([id number]) on  04/21/2015 12:48:34 AM      Results for orders placed or performed during the hospital encounter of 04/20/15  CBC with Differential  Result Value Ref Range   WBC 6.3 4.0 - 10.5 K/uL   RBC 3.99 (L) 4.22 - 5.81 MIL/uL   Hemoglobin 12.3 (L) 13.0 - 17.0 g/dL   HCT 35.9 (L) 39.0 - 52.0 %   MCV 90.0 78.0 - 100.0 fL   MCH 30.8 26.0 - 34.0 pg   MCHC 34.3 30.0 - 36.0 g/dL   RDW 13.3 11.5 - 15.5 %   Platelets 194 150 - 400 K/uL   Neutrophils Relative % 40 (L) 43 - 77 %   Neutro Abs 2.5 1.7 - 7.7 K/uL   Lymphocytes Relative 47 (H) 12 - 46 %   Lymphs Abs 3.1 0.7 - 4.0 K/uL   Monocytes Relative 7 3 - 12 %   Monocytes Absolute 0.4 0.1 - 1.0 K/uL   Eosinophils Relative 5 0 - 5 %   Eosinophils Absolute 0.3 0.0 - 0.7 K/uL   Basophils Relative 1 0 - 1 %   Basophils Absolute 0.0 0.0 - 0.1 K/uL  Comprehensive metabolic panel  Result Value Ref Range   Sodium 138 135 - 145 mmol/L   Potassium 3.4 (L) 3.5 - 5.1 mmol/L   Chloride 100 (L) 101 - 111 mmol/L   CO2 30 22 - 32 mmol/L   Glucose, Bld 116 (H) 65 - 99 mg/dL   BUN 13 6 - 20 mg/dL   Creatinine, Ser 1.06 0.61 - 1.24 mg/dL   Calcium 10.2 8.9 - 10.3 mg/dL   Total Protein 7.5 6.5 - 8.1 g/dL   Albumin 3.9 3.5 - 5.0 g/dL   AST 23 15 - 41 U/L   ALT 48 17 - 63 U/L   Alkaline Phosphatase 118 38 - 126 U/L   Total Bilirubin 0.4 0.3 - 1.2 mg/dL   GFR calc non Af Amer >60 >60 mL/min   GFR calc Af Amer >60 >60 mL/min   Anion gap 8 5 - 15   Urinalysis, Routine w reflex microscopic (not at [facility])  Result Value Ref Range   Color, Urine YELLOW YELLOW   APPearance CLEAR CLEAR   Specific Gravity, Urine 1.022 1.005 - 1.030   pH 5.5 5.0 - 8.0   Glucose, UA >1000 (A) NEGATIVE mg/dL   Hgb urine dipstick NEGATIVE NEGATIVE   Bilirubin Urine NEGATIVE NEGATIVE   Ketones, ur NEGATIVE NEGATIVE mg/dL   Protein, ur NEGATIVE NEGATIVE mg/dL   Urobilinogen, UA 0.2 0.0 - 1.0 mg/dL   Nitrite NEGATIVE NEGATIVE   Leukocytes, UA NEGATIVE NEGATIVE  Urine microscopic-add on  Result Value Ref Range   Squamous Epithelial / LPF RARE RARE   WBC, UA 0-2 <3 WBC/hpf   RBC / HPF 0-2 <3 RBC/hpf   Bacteria, UA RARE RARE  POC CBG, ED  Result Value Ref Range   Glucose-Capillary 118 (H) 65 - 99 mg/dL  I-Stat CG4 Lactic Acid, ED  (not at  [facility])  Result Value Ref Range   Lactic Acid, Venous 1.95 0.5 - 2.0 mmol/L  I-stat troponin, ED  Result Value Ref Range   Troponin i, poc 0.00 0.00 - 0.08 ng/mL   Comment 3          CBG monitoring, ED  Result Value Ref Range   Glucose-Capillary 101 (H) 65 - 99 mg/dL   Comment 1 Notify RN    Comment 2 Document in Chart    Dg Chest 2 View  04/21/2015   CLINICAL DATA:  Patient with dizziness, headache and fatigue for 1 day.  EXAM: CHEST  2 VIEW  COMPARISON:  Chest radiograph 09/10/2014  FINDINGS: Stable cardiac and mediastinal contours. No consolidative pulmonary opacities. No pleural effusion or pneumothorax. Lateral view limited secondary to patient positioning and overlying soft tissue.  IMPRESSION: No acute cardiopulmonary process.   Electronically Signed   By: Lovey Newcomer M.D.   On: 04/21/2015 00:16    Patient out in triage had blood pressures documented systolics in the [id number]. Still the patient was possibly septic based on the presentation. Also he was tachycardic. There was no fever. In retrospect it appears that those blood pressures in triage were erroneous. It put in place the septic protocol. Patients of blood  pressure was up above 90 and up around the 123XX123 systolic without any fluids being provided. Once he got back to the room. Patient's blood pressures now are consistently above 100. Workup for the real concern was a dizziness and that he had run out of his diabetic medicines. Patient never had a headache. Patient did report some weakness and fatigue today but no focal deficit.    Patient looking clinically very stable currently. Nontoxic no acute distress. Patients should be stable for discharge home. Truly believe that the hypotension episodes documented in triage were erroneous. That they were false readings.   Fredia Sorrow, MD 04/21/15 [phone]

## 2015-04-21 NOTE — ED Provider Notes (Signed)
Received a call today at 10:40 PM from [place] stating that pt's anaerobic bottle from last night's blood cultures returned back [place]. Attempted to call pt at [phone] and there was no answer. Left a message on voicemail to return the phone call. If pt does not return call tonight, will alert staff working tomorrow to attempt to reach out to pt again. Ultimately, pt had no leukocytosis or fever, therefore culture result could be contaminant since it only showed up in one bottle. But pt should be contacted regardless in order to ensure he's doing well, if his condition has worsened he will need to return to the ER for evaluation.   Camprubi-Soms, [place], [place]  10:47 PM 04/21/2015   Zacarias Pontes, PA-C 04/21/15 2248

## 2015-04-21 NOTE — ED Notes (Signed)
Pt CGB result was 101. Informed RN.

## 2015-04-21 NOTE — Discharge Instructions (Signed)
Your symptoms are likely from being slightly dehydrated. You will need to make sure to stay well hydrated. Take your insulin as directed, and check your blood sugar at least twice a day. Follow up with your regular doctor in 1 week for recheck of symptoms and ongoing care of your diabetes. Return to the ER for changes or worsening symptoms.   Orthostatic Hypotension Orthostatic hypotension is a sudden drop in blood pressure. It happens when you quickly stand up from a seated or lying position. You may feel dizzy or light-headed. This can last for just a few seconds or for up to a few minutes. It is usually not a serious problem. However, if this happens frequently or gets worse, it can be a sign of something more serious. CAUSES  Different things can cause orthostatic hypotension, including:   Loss of body fluids (dehydration).  Medicines that lower blood pressure.  Sudden changes in posture, such as standing up quickly after you have been sitting or lying down.  Taking too much of your medicine. SIGNS AND SYMPTOMS  1. Light-headedness or dizziness.  2. Fainting or near-fainting.  3. A fast heart rate.  4. Weakness.  5. Feeling tired (fatigue).  DIAGNOSIS  Your health care provider may do several things to help diagnose your condition and identify the cause. These may include:  1. Taking a medical history and doing a physical exam. 2. Checking your blood pressure. Your health care provider will check your blood pressure when you are: 1. Lying down. 2. Sitting. 3. Standing. 3. Using tilt table testing. In this test, you lie down on a table that moves from a lying position to a standing position. You will be strapped onto the table. This test monitors your blood pressure and heart rate when you are in different positions. TREATMENT  Treatment will vary depending on the cause. Possible treatments include:  1. Changing the dosage of your medicines. 2. Wearing compression stockings  on your lower legs. 3. Standing up slowly after sitting or lying down. 4. Eating more salt. 5. Eating frequent, small meals. 6. In some cases, getting IV fluids. 7. Taking medicine to enhance fluid retention. HOME CARE INSTRUCTIONS  Only take over-the-counter or prescription medicines as directed by your health care provider.  Follow your health care provider's instructions for changing the dosage of your current medicines.  Do not stop or adjust your medicine on your own.  Stand up slowly after sitting or lying down. This allows your body to adjust to the different position.  Wear compression stockings as directed.  Eat extra salt as directed.  Do not add extra salt to your diet unless directed to by your health care provider.  Eat frequent, small meals.  Avoid standing suddenly after eating.  Avoid hot showers or excessive heat as directed by your health care provider.  Keep all follow-up appointments. SEEK MEDICAL CARE IF:  You continue to feel dizzy or light-headed after standing.  You feel groggy or confused.  You feel cold, clammy, or sick to your stomach (nauseous).  You have blurred vision.  You feel short of breath. SEEK IMMEDIATE MEDICAL CARE IF:   You faint after standing.  You have chest pain.  You have difficulty breathing.   You lose feeling or movement in your arms or legs.   You have slurred speech or difficulty talking, or you are unable to talk.  MAKE SURE YOU:   Understand these instructions.  Will watch your condition.  Will get help  right away if you are not doing well or get worse. Document Released: 10/05/2002 Document Revised: 10/20/2013 Document Reviewed: 08/07/2013 [facility] Patient Information 2015 [place], [place]. This information is not intended to replace advice given to you by your health care provider. Make sure you discuss any questions you have with your health care provider.  Dizziness  Dizziness means you feel  unsteady or lightheaded. You might feel like you are going to pass out (faint). HOME CARE   Drink enough fluids to keep your pee (urine) clear or pale yellow.  Take your medicines exactly as told by your doctor. If you take blood pressure medicine, always stand up slowly from the lying or sitting position. Hold on to something to steady yourself.  If you need to stand in one place for a long time, move your legs often. Tighten and relax your leg muscles.  Have someone stay with you until you feel okay.  Do not drive or use heavy machinery if you feel dizzy.  Do not drink alcohol. GET HELP RIGHT AWAY IF:  6. You feel dizzy or lightheaded and it gets worse. 7. You feel sick to your stomach (nauseous), or you throw up (vomit). 8. You have trouble talking or walking. 9. You feel weak or have trouble using your arms, hands, or legs. 10. You cannot think clearly or have trouble forming sentences. 11. You have chest pain, belly (abdominal) pain, sweating, or you are short of breath. 12. Your vision changes. 13. You are bleeding. 14. You have problems from your medicine that seem to be getting worse. MAKE SURE YOU:  4. Understand these instructions. 5. Will watch your condition. 6. Will get help right away if you are not doing well or get worse. Document Released: 10/04/2011 Document Revised: 01/07/2012 Document Reviewed: 10/04/2011 [facility] Patient Information 2015 [place], [place]. This information is not intended to replace advice given to you by your health care provider. Make sure you discuss any questions you have with your health care provider.  Hyperglycemia Hyperglycemia occurs when the glucose (sugar) in your blood is too high. Hyperglycemia can happen for many reasons, but it most often happens to people who do not know they have diabetes or are not managing their diabetes properly.  CAUSES  Whether you have diabetes or not, there are other causes of hyperglycemia. Hyperglycemia  can occur when you have diabetes, but it can also occur in other situations that you might not be as aware of, such as: Diabetes  If you have diabetes and are having problems controlling your blood glucose, hyperglycemia could occur because of some of the following reasons:  Not following your meal plan.  Not taking your diabetes medications or not taking it properly.  Exercising less or doing less activity than you normally do.  Being sick. Pre-diabetes 15. This cannot be ignored. Before people develop Type 2 diabetes, they almost always have "pre-diabetes." This is when your blood glucose levels are higher than normal, but not yet high enough to be diagnosed as diabetes. Research has shown that some long-term damage to the body, especially the heart and circulatory system, may already be occurring during pre-diabetes. If you take action to manage your blood glucose when you have pre-diabetes, you may delay or prevent Type 2 diabetes from developing. Stress 7. If you have diabetes, you may be "diet" controlled or on oral medications or insulin to control your diabetes. However, you may find that your blood glucose is higher than usual in the hospital whether  you have diabetes or not. This is often referred to as "stress hyperglycemia." Stress can elevate your blood glucose. This happens because of hormones put out by the body during times of stress. If stress has been the cause of your high blood glucose, it can be followed regularly by your caregiver. That way he/she can make sure your hyperglycemia does not continue to get worse or progress to diabetes. Steroids 8. Steroids are medications that act on the infection fighting system (immune system) to block inflammation or infection. One side effect can be a rise in blood glucose. Most people can produce enough extra insulin to allow for this rise, but for those who cannot, steroids make blood glucose levels go even higher. It is not unusual for  steroid treatments to "uncover" diabetes that is developing. It is not always possible to determine if the hyperglycemia will go away after the steroids are stopped. A special blood test called an A1c is sometimes done to determine if your blood glucose was elevated before the steroids were started. SYMPTOMS  Thirsty.  Frequent urination.  Dry mouth.  Blurred vision.  Tired or fatigue.  Weakness.  Sleepy.  Tingling in feet or leg. DIAGNOSIS  Diagnosis is made by monitoring blood glucose in one or all of the following ways:  A1c test. This is a chemical found in your blood.  Fingerstick blood glucose monitoring.  Laboratory results. TREATMENT  First, knowing the cause of the hyperglycemia is important before the hyperglycemia can be treated. Treatment may include, but is not be limited to:  Education.  Change or adjustment in medications.  Change or adjustment in meal plan.  Treatment for an illness, infection, etc.  More frequent blood glucose monitoring.  Change in exercise plan.  Decreasing or stopping steroids.  Lifestyle changes. HOME CARE INSTRUCTIONS   Test your blood glucose as directed.  Exercise regularly. Your caregiver will give you instructions about exercise. Pre-diabetes or diabetes which comes on with stress is helped by exercising.  Eat wholesome, balanced meals. Eat often and at regular, fixed times. Your caregiver or nutritionist will give you a meal plan to guide your sugar intake.  Being at an ideal weight is important. If needed, losing as little as 10 to 15 pounds may help improve blood glucose levels. SEEK MEDICAL CARE IF:   You have questions about medicine, activity, or diet.  You continue to have symptoms (problems such as increased thirst, urination, or weight gain). SEEK IMMEDIATE MEDICAL CARE IF:   You are vomiting or have diarrhea.  Your breath smells fruity.  You are breathing faster or slower.  You are very sleepy or  incoherent.  You have numbness, tingling, or pain in your feet or hands.  You have chest pain.  Your symptoms get worse even though you have been following your caregiver's orders.  If you have any other questions or concerns. Document Released: 04/10/2001 Document Revised: 01/07/2012 Document Reviewed: 02/11/2012 [facility] Patient Information 2015 [place], [place]. This information is not intended to replace advice given to you by your health care provider. Make sure you discuss any questions you have with your health care provider.  How and Where to Give Subcutaneous Insulin Injections, Adult People with type 1 diabetes must take insulin since their bodies do not make it. People with type 2 diabetes may require insulin. There are many different types of insulin as well as other injectable diabetes medicines that are meant to be injected into the fat layer under your skin. The  type of insulin or injectable diabetes medicine you take may determine how many injections you give yourself and when to take the injections.  CHOOSING A SITE FOR INJECTION Insulin absorption varies from site to site. As with any injectable medication it is best for the insulin to be injected within the same body region. However, do not inject the insulin in the same spot each time. Rotating the spots you give your injections will prevent inflammation or tissue breakdown. There are four main regions that can be used for injections. The regions include the:  Abdomen (preferred region, especially for non-insulin injectable diabetes medicine).  Front and upper outer sides of thighs.  Back of upper arm.  Buttocks. USING A SYRINGE AND VIAL Drawing up insulin: single insulin dose 16. Wash your hands with soap and water. 17. Gently roll the insulin bottle (vial) between your hands to mix it. Do not shake the vial. 18. Clean the top rubber part of the vial with an alcohol wipe. Be sure that the plastic pop-top has been  removed on newer vials. 19. Remove the plastic cover from the needle on the syringe. Do not let the needle touch anything. 20. Pull the plunger back to draw air into the syringe. The air should be the same amount as the insulin dose. 21. Push the needle through the rubber on the top of the vial. Do not turn the vial over. 22. Push the plunger in all the way to put the air into the vial. 23. Leave the needle in the vial and turn the vial and syringe upside down. 24. Pull down slowly on the plunger, drawing the amount of insulin you need into the syringe. 25. Look for air bubbles in the syringe. You may need to push the plunger up and down 2 to 3 times to slowly get rid of any air bubbles in the syringe. 26. Pull back the plunger to get your correct dose. 27. Remove the needle from the vial. 28. Use an alcohol wipe to clean the area of the body to be injected. 29. Pinch up 1 inch of skin and hold it. 30. Put the needle straight into the skin (90-degree angle). Put the needle in as far as it will go (to the hub). The needle may need to be injected at a 45-degree angle in small adults with little fat. 31. When the needle is in, you can let go of your skin. 32. Push the plunger down all the way to inject the insulin. 33. Pull the needle straight out of the skin. 34. Press the alcohol wipe over the spot where you gave your injection. Keep it there for a few seconds. Do not rub the area. 35. Do not put the plastic cover back on the needle. Drawing up insulin: mixing 2 insulins 8. Wash your hands with soap and water. 9. Gently roll the vial of "cloudy" insulin between your hands or rotate the vial from top to bottom to mix. 10. Clean the top of both vials with an alcohol wipe. Be sure that the plastic pop-top lid has been removed on newer vials. 11. Pull air into the syringe to equal the dose of "cloudy" insulin. 12. Stick the needle into the "cloudy" insulin vial and inject the air. Be sure to keep  the vial upright. 13. Remove the needle from the "cloudy" insulin vial. 14. Pull air into the syringe to equal the dose of "clear" insulin. 15. Stick the needle into the "clear" insulin vial and inject  the air. 16. Leave the needle in the "clear" insulin vial and turn the vial upside down. 17. Pull down on the plunger and slowly draw into the syringe the number of units of "clear" insulin desired. 18. Look for air bubbles in the syringe. You may need to push the plunger up and down 2 to 3 times to slowly get rid of any air bubbles in the syringe. 19. Remove the needle from the "clear" insulin vial. 20. Stick the needle into the "cloudy" insulin vial. Do not inject any of the "clear" insulin into the "cloudy" vial. 21. Turn the "cloudy" vial upside down and pull the plunger down to the number of units that equals the total number of units of "clear" and "cloudy" insulins. 22. Remove the needle from the "cloudy" insulin vial. 23. Use an alcohol wipe to clean the area of the body to be injected. 24. Put the needle straight into the skin (90-degree angle). Put the needle in as far as it will go (to the hub). The needle may need to be injected at a 45-degree angle in small adults with little fat. 25. When the needle is in, you can let go of your skin. 26. Push the plunger down all the way to inject the insulin. 27. Pull the needle straight out of the skin. 28. Press the alcohol wipe over the spot where you gave your injection. Keep it there for a few seconds. Do not rub the area. 29. Do not put the plastic cover back on the needle. USING INSULIN PENS 9. Wash your hands with soap and water. 10. If you are using the "cloudy" insulin, roll the pen between your palms several times or rotate the pen top to bottom several times. 11. Remove the insulin pen cap. 12. Clean the rubber stopper of the cartridge with an alcohol wipe. 13. Remove the protective paper tab from the disposable needle. 14. Screw the  needle onto the pen. 15. Remove the outer plastic needle cover. 16. Remove the inner plastic needle cover. 17. Prime the insulin pen by turning the button (dial) to 2 units. Hold the pen with the needle pointing up, and push the dial on the opposite end until a drop of insulin appears at the needle tip. If no insulin appears, repeat this step. 18. Dial the number of units of insulin you will inject. 19. Use an alcohol wipe to clean the area of the body to be injected. 20. Pinch up 1 inch of skin and hold it. 21. Put the needle straight into the skin (90-degree angle). 22. Push the dial down to push the insulin into the fat tissue. 23. Count to 10 slowly. Then, remove the needle from the fat tissue. 24. Carefully replace the larger outer plastic needle cover over the needle and unscrew the capped needle. THROWING AWAY SUPPLIES  Discard used needles in a puncture proof sharps disposal container. Follow disposal regulations for the area where you live.  Vials and empty disposable pens may be thrown away in the regular trash. Document Released: 01/05/2004 Document Revised: 03/01/2014 Document Reviewed: 03/24/2013 [facility] Patient Information 2015 [place], [place]. This information is not intended to replace advice given to you by your health care provider. Make sure you discuss any questions you have with your health care provider.

## 2015-04-22 ENCOUNTER — Telehealth (HOSPITAL_COMMUNITY): Payer: Self-pay | Admitting: *Deleted

## 2015-04-22 LAB — URINE CULTURE

## 2015-04-23 ENCOUNTER — Telehealth: Payer: Self-pay | Admitting: Emergency Medicine

## 2015-04-24 ENCOUNTER — Telehealth: Payer: Self-pay | Admitting: Emergency Medicine

## 2015-04-26 LAB — CULTURE, BLOOD (ROUTINE X 2): Culture: NO GROWTH

## 2015-05-06 ENCOUNTER — Emergency Department (HOSPITAL_COMMUNITY)
Admission: EM | Admit: 2015-05-06 | Discharge: 2015-05-07 | Disposition: A | Discharge status: Home / Self Care | Payer: Medicare Other | Attending: Emergency Medicine | Admitting: Emergency Medicine

## 2015-05-06 ENCOUNTER — Encounter (HOSPITAL_COMMUNITY): Payer: Self-pay | Admitting: Emergency Medicine

## 2015-05-06 DIAGNOSIS — Z794 Long term (current) use of insulin: Secondary | ICD-10-CM | POA: Diagnosis not present

## 2015-05-06 DIAGNOSIS — Z720 Tobacco use: Secondary | ICD-10-CM | POA: Insufficient documentation

## 2015-05-06 DIAGNOSIS — R739 Hyperglycemia, unspecified: Secondary | ICD-10-CM

## 2015-05-06 DIAGNOSIS — R0602 Shortness of breath: Secondary | ICD-10-CM | POA: Insufficient documentation

## 2015-05-06 DIAGNOSIS — R42 Dizziness and giddiness: Secondary | ICD-10-CM | POA: Insufficient documentation

## 2015-05-06 DIAGNOSIS — E1165 Type 2 diabetes mellitus with hyperglycemia: Secondary | ICD-10-CM | POA: Insufficient documentation

## 2015-05-06 DIAGNOSIS — F17210 Nicotine dependence, cigarettes, uncomplicated: Secondary | ICD-10-CM | POA: Diagnosis not present

## 2015-05-06 LAB — COMPREHENSIVE METABOLIC PANEL
ALBUMIN: 3.7 g/dL (ref 3.5–5.0)
ALK PHOS: 104 U/L (ref 38–126)
ALT: 24 U/L (ref 17–63)
AST: 20 U/L (ref 15–41)
Anion gap: 7 (ref 5–15)
BILIRUBIN TOTAL: 0.3 mg/dL (ref 0.3–1.2)
BUN: 8 mg/dL (ref 6–20)
CO2: 29 mmol/L (ref 22–32)
CREATININE: 0.97 mg/dL (ref 0.61–1.24)
Calcium: 8.7 mg/dL — ABNORMAL LOW (ref 8.9–10.3)
Chloride: 101 mmol/L (ref 101–111)
GFR calc Af Amer: >60 mL/min (ref >60)
GFR calc non Af Amer: >60 mL/min (ref >60)
Glucose, Bld: 392 mg/dL — ABNORMAL HIGH (ref 65–99)
Potassium: 3.1 mmol/L — ABNORMAL LOW (ref 3.5–5.1)
Sodium: 137 mmol/L (ref 135–145)
TOTAL PROTEIN: 7.2 g/dL (ref 6.5–8.1)

## 2015-05-06 LAB — I-STAT TROPONIN, ED: Troponin i, poc: 0 ng/mL (ref 0.00–0.08)

## 2015-05-06 LAB — URINALYSIS, ROUTINE W REFLEX MICROSCOPIC
Bilirubin Urine: NEGATIVE
Glucose, UA: >1000 mg/dL — AB
HGB URINE DIPSTICK: NEGATIVE
Ketones, ur: NEGATIVE mg/dL
LEUKOCYTES UA: NEGATIVE
NITRITE: NEGATIVE
PROTEIN: NEGATIVE mg/dL
Specific Gravity, Urine: 1.027 (ref 1.005–1.030)
UROBILINOGEN UA: 0.2 mg/dL (ref 0.0–1.0)
pH: 6 (ref 5.0–8.0)

## 2015-05-06 LAB — CBC
HEMATOCRIT: 34.3 % — AB (ref 39.0–52.0)
Hemoglobin: 11.8 g/dL — ABNORMAL LOW (ref 13.0–17.0)
MCH: 30.2 pg (ref 26.0–34.0)
MCHC: 34.4 g/dL (ref 30.0–36.0)
MCV: 87.7 fL (ref 78.0–100.0)
Platelets: 172 10*3/uL (ref 150–400)
RBC: 3.91 MIL/uL — ABNORMAL LOW (ref 4.22–5.81)
RDW: 13 % (ref 11.5–15.5)
WBC: 5 10*3/uL (ref 4.0–10.5)

## 2015-05-06 LAB — URINE MICROSCOPIC-ADD ON

## 2015-05-06 LAB — CBG MONITORING, ED: GLUCOSE-CAPILLARY: 311 mg/dL — AB (ref 65–99)

## 2015-05-06 MED ORDER — INSULIN ASPART 100 UNIT/ML ~~LOC~~ SOLN
10.0000 [IU] | Freq: Once | SUBCUTANEOUS | Status: AC
  Administered 2015-05-06: 10 [IU] via SUBCUTANEOUS
  Filled 2015-05-06: qty 1

## 2015-05-06 NOTE — ED Notes (Signed)
Patient here with family member. Complaint of shortness of breath, dizziness, and hyperglycemia starting today. Family states blood sugar @ 400 today. Has not been getting prescribed insulin because family has been unable to obtain injection supplies. Denies chest pain or history of similar.

## 2015-05-06 NOTE — ED Provider Notes (Signed)
CSN: [id number]     Arrival date & time 05/06/15  1959 History  This chart was scribed for Varney Biles, MD by Evelene Croon, ED Scribe. This patient was seen in room B15C/B15C and the patient's care was started 11:31 PM.    Chief Complaint  Patient presents with  . Hyperglycemia  . Shortness of Breath  . Dizziness    The history is provided by the patient and a relative (Daughter). No language interpreter was used.     HPI Comments:  Brian Owen is a 64 y.o. male who presents to the Emergency Department complaining of an episode of SOB onset ~2000. Daughter states he was fine during the daytime but notes he went for a walk ~1900 tonight. Pt denies SOB immediately after the walk; pt states he walks on a regular basis. Daughter states he did not become short of breath until a little later when she was checking his blood sugar. She reports BS of 400 this evening. She states she  has insulin but the pt has been out ofthe needle for ~ 1 month and therefore has not taken it in 1 month. Pt denies frequent urination, CP, and SOB at this time. Daughter denies h/o COPD/asthma, cardiac hx and h/o blood clots. No modifying factors noted.  PCP: NONE   Past Medical History  Diagnosis Date  . Diabetes mellitus without complication   . Heavy cigarette smoker    Past Surgical History  Procedure Laterality Date  . No past surgeries     History reviewed. No pertinent family history. History  Substance Use Topics  . Smoking status: Current Every Day Smoker -- 40 years    Types: Cigarettes  . Smokeless tobacco: Former Systems developer    Quit date: 03/29/2014  . Alcohol Use: No    Review of Systems  Constitutional: Negative for fever and chills.       + Hyperglycemia   Respiratory: Positive for shortness of breath.   Cardiovascular: Negative for chest pain.  Genitourinary: Negative for frequency.  All other systems reviewed and are negative.     Allergies  Review of patient's allergies indicates  no known allergies.  Home Medications   Prior to Admission medications   Medication Sig Start Date End Date Taking? Authorizing Provider  glucose blood test strip Use as instructed 04/21/15   Mercedes Camprubi-Soms, PA-C  insulin NPH-regular Human (NOVOLIN 70/30) (70-30) 100 UNIT/ML injection Inject 10 Units into the skin 2 (two) times daily with a meal. 03/19/15   Orlie Dakin, MD  insulin NPH-regular Human (NOVOLIN 70/30) (70-30) 100 UNIT/ML injection Inject 10 Units into the skin 2 (two) times daily with a meal. 04/21/15   Mercedes Camprubi-Soms, PA-C   BP 147/88 mmHg  Pulse 79  Resp 16  SpO2 100% Physical Exam  Constitutional: He is oriented to person, place, and time. He appears well-developed and well-nourished. No distress.  HENT:  Head: Normocephalic and atraumatic.  Eyes: Conjunctivae are normal.  Neck: Normal range of motion. No JVD present.  Cardiovascular: Normal rate, regular rhythm and normal heart sounds.   Radial pulses equal    Pulmonary/Chest: Effort normal. He has rales.  Mild basilar rales   Musculoskeletal: Normal range of motion. He exhibits no edema.  No pitting edema  No lower extremity unilateral swelling.   Neurological: He is alert and oriented to person, place, and time.  Skin: Skin is warm and dry.  Nursing note and vitals reviewed.   ED Course  Procedures   DIAGNOSTIC  STUDIES:  Oxygen Saturation is 100% on RA, normal by my interpretation.    COORDINATION OF CARE:  11:35 PM Discussed treatment plan with pt at bedside and pt agreed to plan.  Labs Review Labs Reviewed  CBC - Abnormal; Notable for the following:    RBC 3.91 (*)    Hemoglobin 11.8 (*)    HCT 34.3 (*)    All other components within normal limits  COMPREHENSIVE METABOLIC PANEL - Abnormal; Notable for the following:    Potassium 3.1 (*)    Glucose, Bld 392 (*)    Calcium 8.7 (*)    All other components within normal limits  URINALYSIS, ROUTINE W REFLEX MICROSCOPIC (NOT AT  [facility]) - Abnormal; Notable for the following:    Glucose, UA >1000 (*)    All other components within normal limits  CBG MONITORING, ED - Abnormal; Notable for the following:    Glucose-Capillary 311 (*)    All other components within normal limits  CBG MONITORING, ED - Abnormal; Notable for the following:    Glucose-Capillary 309 (*)    All other components within normal limits  URINE MICROSCOPIC-ADD ON  Randolm Idol, ED    Imaging Review Dg Chest 2 View  05/07/2015   CLINICAL DATA:  Acute onset of shortness of breath, dizziness and hyperglycemia. Initial encounter.  EXAM: CHEST  2 VIEW  COMPARISON:  Chest radiograph from 04/20/2015  FINDINGS: The lungs are well-aerated. Right basilar airspace opacity raises concern for mild pneumonia. There is no evidence of pleural effusion or pneumothorax.  The heart is normal in size; the mediastinal contour is within normal limits. No acute osseous abnormalities are seen.  IMPRESSION: Mild right basilar airspace opacity raises concern for mild pneumonia.   Electronically Signed   By: Garald Balding M.D.   On: 05/07/2015 00:14     EKG Interpretation   Date/Time:  Friday May 06 2015 23:52:18 EDT Ventricular Rate:  81 PR Interval:  142 QRS Duration: 91 QT Interval:  394 QTC Calculation: 457 R Axis:   58 Text Interpretation:  Sinus rhythm has not changed Confirmed by Josimar Corning,  MD, Jakel Alphin ([id number]) on 05/07/2015 12:24:43 AM      MDM   Final diagnoses:  Shortness of breath  Hyperglycemia without ketosis   Pt comes in with hyperglycemia and DIB. Pt is resting comfortably, and has no signs of fluid over load. He has no dib with exertion in general, and this isolated episode had no exertional component to it, and is better on its own. Pt ambulated with puslox and did great, and the lung and heart exam is normal. Pt also needs diabetic needles and other supplies. He is not in DKA. Will give him some 10 units subcutaneous insulin aspart here and  d/c with SW to contact them for the supplies.    Varney Biles, MD 05/07/15 [phone]

## 2015-05-06 NOTE — ED Notes (Signed)
100% while ambulating

## 2015-05-07 ENCOUNTER — Emergency Department (HOSPITAL_COMMUNITY): Payer: Medicare Other

## 2015-05-07 DIAGNOSIS — R0602 Shortness of breath: Secondary | ICD-10-CM | POA: Diagnosis not present

## 2015-05-07 LAB — CBG MONITORING, ED: GLUCOSE-CAPILLARY: 309 mg/dL — AB (ref 65–99)

## 2015-05-07 NOTE — Discharge Instructions (Signed)
We have called our social work to contact you and help you with the diabetic supplies.  All the ER results are normal.   Hyperglycemia Hyperglycemia occurs when the glucose (sugar) in your blood is too high. Hyperglycemia can happen for many reasons, but it most often happens to people who do not know they have diabetes or are not managing their diabetes properly.  CAUSES  Whether you have diabetes or not, there are other causes of hyperglycemia. Hyperglycemia can occur when you have diabetes, but it can also occur in other situations that you might not be as aware of, such as: Diabetes  If you have diabetes and are having problems controlling your blood glucose, hyperglycemia could occur because of some of the following reasons:  Not following your meal plan.  Not taking your diabetes medications or not taking it properly.  Exercising less or doing less activity than you normally do.  Being sick. Pre-diabetes  This cannot be ignored. Before people develop Type 2 diabetes, they almost always have "pre-diabetes." This is when your blood glucose levels are higher than normal, but not yet high enough to be diagnosed as diabetes. Research has shown that some long-term damage to the body, especially the heart and circulatory system, may already be occurring during pre-diabetes. If you take action to manage your blood glucose when you have pre-diabetes, you may delay or prevent Type 2 diabetes from developing. Stress  If you have diabetes, you may be "diet" controlled or on oral medications or insulin to control your diabetes. However, you may find that your blood glucose is higher than usual in the hospital whether you have diabetes or not. This is often referred to as "stress hyperglycemia." Stress can elevate your blood glucose. This happens because of hormones put out by the body during times of stress. If stress has been the cause of your high blood glucose, it can be followed regularly by  your caregiver. That way he/she can make sure your hyperglycemia does not continue to get worse or progress to diabetes. Steroids  Steroids are medications that act on the infection fighting system (immune system) to block inflammation or infection. One side effect can be a rise in blood glucose. Most people can produce enough extra insulin to allow for this rise, but for those who cannot, steroids make blood glucose levels go even higher. It is not unusual for steroid treatments to "uncover" diabetes that is developing. It is not always possible to determine if the hyperglycemia will go away after the steroids are stopped. A special blood test called an A1c is sometimes done to determine if your blood glucose was elevated before the steroids were started. SYMPTOMS  Thirsty.  Frequent urination.  Dry mouth.  Blurred vision.  Tired or fatigue.  Weakness.  Sleepy.  Tingling in feet or leg. DIAGNOSIS  Diagnosis is made by monitoring blood glucose in one or all of the following ways:  A1c test. This is a chemical found in your blood.  Fingerstick blood glucose monitoring.  Laboratory results. TREATMENT  First, knowing the cause of the hyperglycemia is important before the hyperglycemia can be treated. Treatment may include, but is not be limited to:  Education.  Change or adjustment in medications.  Change or adjustment in meal plan.  Treatment for an illness, infection, etc.  More frequent blood glucose monitoring.  Change in exercise plan.  Decreasing or stopping steroids.  Lifestyle changes. HOME CARE INSTRUCTIONS   Test your blood glucose as directed.  Exercise regularly. Your caregiver will give you instructions about exercise. Pre-diabetes or diabetes which comes on with stress is helped by exercising.  Eat wholesome, balanced meals. Eat often and at regular, fixed times. Your caregiver or nutritionist will give you a meal plan to guide your sugar  intake.  Being at an ideal weight is important. If needed, losing as little as 10 to 15 pounds may help improve blood glucose levels. SEEK MEDICAL CARE IF:   You have questions about medicine, activity, or diet.  You continue to have symptoms (problems such as increased thirst, urination, or weight gain). SEEK IMMEDIATE MEDICAL CARE IF:   You are vomiting or have diarrhea.  Your breath smells fruity.  You are breathing faster or slower.  You are very sleepy or incoherent.  You have numbness, tingling, or pain in your feet or hands.  You have chest pain.  Your symptoms get worse even though you have been following your caregiver's orders.  If you have any other questions or concerns. Document Released: 04/10/2001 Document Revised: 01/07/2012 Document Reviewed: 02/11/2012 [facility] Patient Information 2015 [place], [place]. This information is not intended to replace advice given to you by your health care provider. Make sure you discuss any questions you have with your health care provider.

## 2015-05-07 NOTE — ED Notes (Signed)
Pt stable, ambulatory, states understanding of discharge instructions 

## 2015-06-06 ENCOUNTER — Other Ambulatory Visit: Payer: Self-pay | Admitting: Family Medicine

## 2015-06-06 DIAGNOSIS — IMO0002 Reserved for concepts with insufficient information to code with codable children: Secondary | ICD-10-CM

## 2015-06-06 DIAGNOSIS — E1165 Type 2 diabetes mellitus with hyperglycemia: Secondary | ICD-10-CM

## 2015-06-06 MED ORDER — GLUCOSE BLOOD VI STRP: 1.0000 | ORAL_STRIP | Freq: Three times a day (TID) | Status: DC

## 2015-06-10 ENCOUNTER — Emergency Department (HOSPITAL_COMMUNITY)
Admission: EM | Admit: 2015-06-10 | Discharge: 2015-06-11 | Disposition: A | Discharge status: Home / Self Care | Payer: Medicare Other | Attending: Emergency Medicine | Admitting: Emergency Medicine

## 2015-06-10 ENCOUNTER — Encounter (HOSPITAL_COMMUNITY): Payer: Self-pay | Admitting: *Deleted

## 2015-06-10 DIAGNOSIS — Z79899 Other long term (current) drug therapy: Secondary | ICD-10-CM | POA: Insufficient documentation

## 2015-06-10 DIAGNOSIS — E1165 Type 2 diabetes mellitus with hyperglycemia: Secondary | ICD-10-CM | POA: Insufficient documentation

## 2015-06-10 DIAGNOSIS — R42 Dizziness and giddiness: Secondary | ICD-10-CM | POA: Diagnosis not present

## 2015-06-10 DIAGNOSIS — R739 Hyperglycemia, unspecified: Secondary | ICD-10-CM

## 2015-06-10 DIAGNOSIS — R51 Headache: Secondary | ICD-10-CM | POA: Diagnosis not present

## 2015-06-10 DIAGNOSIS — Z794 Long term (current) use of insulin: Secondary | ICD-10-CM | POA: Insufficient documentation

## 2015-06-10 DIAGNOSIS — Z720 Tobacco use: Secondary | ICD-10-CM | POA: Insufficient documentation

## 2015-06-10 LAB — URINALYSIS, ROUTINE W REFLEX MICROSCOPIC
Bilirubin Urine: NEGATIVE
Glucose, UA: >1000 mg/dL — AB
Hgb urine dipstick: NEGATIVE
Ketones, ur: NEGATIVE mg/dL
LEUKOCYTES UA: NEGATIVE
NITRITE: NEGATIVE
PROTEIN: NEGATIVE mg/dL
Specific Gravity, Urine: 1.03 (ref 1.005–1.030)
UROBILINOGEN UA: 0.2 mg/dL (ref 0.0–1.0)
pH: 6.5 (ref 5.0–8.0)

## 2015-06-10 LAB — CBC
HEMATOCRIT: 36.3 % — AB (ref 39.0–52.0)
HEMOGLOBIN: 12.7 g/dL — AB (ref 13.0–17.0)
MCH: 31.1 pg (ref 26.0–34.0)
MCHC: 35 g/dL (ref 30.0–36.0)
MCV: 88.8 fL (ref 78.0–100.0)
Platelets: 172 10*3/uL (ref 150–400)
RBC: 4.09 MIL/uL — ABNORMAL LOW (ref 4.22–5.81)
RDW: 12.6 % (ref 11.5–15.5)
WBC: 5.2 10*3/uL (ref 4.0–10.5)

## 2015-06-10 LAB — LIPASE, BLOOD: Lipase: 40 U/L (ref 22–51)

## 2015-06-10 LAB — COMPREHENSIVE METABOLIC PANEL
ALBUMIN: 3.7 g/dL (ref 3.5–5.0)
ALK PHOS: 106 U/L (ref 38–126)
ALT: 16 U/L — AB (ref 17–63)
AST: 23 U/L (ref 15–41)
Anion gap: 10 (ref 5–15)
BUN: 12 mg/dL (ref 6–20)
CO2: 29 mmol/L (ref 22–32)
Calcium: 9.1 mg/dL (ref 8.9–10.3)
Chloride: 95 mmol/L — ABNORMAL LOW (ref 101–111)
Creatinine, Ser: 1.14 mg/dL (ref 0.61–1.24)
GFR calc Af Amer: >60 mL/min (ref >60)
GFR calc non Af Amer: >60 mL/min (ref >60)
Glucose, Bld: 545 mg/dL — ABNORMAL HIGH (ref 65–99)
POTASSIUM: 3.1 mmol/L — AB (ref 3.5–5.1)
Sodium: 134 mmol/L — ABNORMAL LOW (ref 135–145)
TOTAL PROTEIN: 6.8 g/dL (ref 6.5–8.1)
Total Bilirubin: 0.3 mg/dL (ref 0.3–1.2)

## 2015-06-10 LAB — CBG MONITORING, ED: Glucose-Capillary: 528 mg/dL — ABNORMAL HIGH (ref 65–99)

## 2015-06-10 LAB — URINE MICROSCOPIC-ADD ON

## 2015-06-10 MED ORDER — SODIUM CHLORIDE 0.9 % IV BOLUS (SEPSIS)
1000.0000 mL | Freq: Once | INTRAVENOUS | Status: AC
  Administered 2015-06-11: 1000 mL via INTRAVENOUS

## 2015-06-10 MED ORDER — ACETAMINOPHEN 325 MG PO TABS
650.0000 mg | ORAL_TABLET | Freq: Once | ORAL | Status: AC
Start: 2015-06-11 — End: 2015-06-11
  Administered 2015-06-11: 650 mg via ORAL
  Filled 2015-06-10: qty 2

## 2015-06-10 MED ORDER — INSULIN ASPART PROT & ASPART (70-30 MIX) 100 UNIT/ML ~~LOC~~ SUSP
10.0000 [IU] | Freq: Once | SUBCUTANEOUS | Status: AC
  Administered 2015-06-11: 10 [IU] via SUBCUTANEOUS
  Filled 2015-06-10: qty 10

## 2015-06-10 NOTE — ED Notes (Signed)
The pt is c/o abd pain  And earlier he went downstairs and when he came back upstairs he was dizzy.  His blood sugar was over 500 at home.    Sl unsteady gait when he was upright

## 2015-06-10 NOTE — ED Notes (Signed)
Lab called and said the urine was not labeled triage notified

## 2015-06-10 NOTE — ED Provider Notes (Signed)
This chart was scribed for [place], DO by Hansel Feinstein, ED Scribe. This patient was seen in room A01C/A01C and the patient's care was started at 11:40 PM.    TIME SEEN: 11:40 PM   CHIEF COMPLAINT:  Chief Complaint  Patient presents with  . Dizziness     HPI: HPI Comments: Brian Owen is a 64 y.o. male with Hx of IDDM who presents to the Emergency Department complaining of moderate dizziness onset tonight. He states associated HA. Family member states that he ate dinner, took a nap and began to feel dizzy after a walk. She states that his blood sugar was 500 at home and 545 after arrival to the ED. He is prescribed 10 units bid of 70/30 Novolin twice a day after meals, but has not received insulin injections since June due to a lack of needles. Patient's daughter reports that she is unable to afford his needles. Pt is not currently followed by a PCP. Pt denies any other pain. He denies any fever, cough, vomiting, diarrhea.    ROS: See HPI Constitutional: no fever  Eyes: no drainage  ENT: no runny nose   Cardiovascular:  no chest pain  Resp: no SOB, cough GI: no vomiting, diarrhea  GU: no dysuria Integumentary: no rash  Allergy: no hives  Musculoskeletal: no leg swelling  Neurological: no slurred speech. Positive for HA, dizziness ROS otherwise negative  PAST MEDICAL HISTORY/PAST SURGICAL HISTORY:  Past Medical History  Diagnosis Date  . Diabetes mellitus without complication   . Heavy cigarette smoker     MEDICATIONS:  Prior to Admission medications   Medication Sig Start Date End Date Taking? Authorizing Provider  glucose blood test strip 1 each by Other route 3 (three) times daily. ICD 10-E11.65 06/06/15   Boykin Nearing, MD  insulin NPH-regular Human (NOVOLIN 70/30) (70-30) 100 UNIT/ML injection Inject 10 Units into the skin 2 (two) times daily with a meal. Patient not taking: Reported on 06/10/2015 03/19/15   Orlie Dakin, MD  insulin NPH-regular Human (NOVOLIN 70/30)  (70-30) 100 UNIT/ML injection Inject 10 Units into the skin 2 (two) times daily with a meal. Patient not taking: Reported on 06/10/2015 04/21/15   Mercedes Camprubi-Soms, PA-C    ALLERGIES:  No Known Allergies  SOCIAL HISTORY:  Social History  Substance Use Topics  . Smoking status: Current Every Day Smoker -- 40 years    Types: Cigarettes  . Smokeless tobacco: Former Systems developer    Quit date: 03/29/2014  . Alcohol Use: No    FAMILY HISTORY: No family history on file.  EXAM: BP 159/88 mmHg  Pulse 76  Resp 22  SpO2 100% Temp 97.7 oral CONSTITUTIONAL: Alert and oriented and responds appropriately to questions. Well-appearing; well-nourished. Elderly but under no distress.   HEAD: Normocephalic EYES: Conjunctivae clear, PERRL ENT: normal nose; no rhinorrhea; slightly dry mucous membranes; pharynx without lesions noted NECK: Supple, no meningismus, no LAD  CARD: RRR; S1 and S2 appreciated; no murmurs, no clicks, no rubs, no gallops RESP: Normal chest excursion without splinting or tachypnea; breath sounds clear and equal bilaterally; no wheezes, no rhonchi, no rales, no hypoxia or respiratory distress, speaking full sentences ABD/GI: Normal bowel sounds; non-distended; soft, non-tender, no rebound, no guarding, no peritoneal signs BACK:  The back appears normal and is non-tender to palpation, there is no CVA tenderness EXT: Normal ROM in all joints; non-tender to palpation; no edema; normal capillary refill; no cyanosis, no calf tenderness or swelling    SKIN: Normal  color for age and race; warm NEURO: Moves all extremities equally, sensation to light touch intact diffusely, cranial nerves II through XII intact PSYCH: The patient's mood and manner are appropriate. Grooming and personal hygiene are appropriate.  MEDICAL DECISION MAKING: Patient here with hyperglycemia without DKA. Bicarbonate is 29, anion gap 10. We'll give IV fluids, subcutaneous insulin.   Message left for social work to  call family back in the morning.  ED PROGRESS: Glucose has improved to 199 after IV fluids and subcutaneous insulin. Patient has been provided a box of insulin syringes and insulin needles, 100 and total. We'll also provide him with a refill of their insulin prescription. Have recommended close outpatient follow-up and will provide with list of primary care providers. Discussed usual and customary return precautions. They verbalize understanding and are comfortable with plan.    EKG Interpretation  Date/Time:  Friday June 10 2015 20:33:20 EDT Ventricular Rate:  96 PR Interval:  144 QRS Duration: 88 QT Interval:  362 QTC Calculation: 457 R Axis:   72 Text Interpretation:  Normal sinus rhythm Normal ECG No significant change since last tracing Confirmed by Keiva Dina,  DO, Clista Rainford [id number]) on 06/10/2015 11:49:40 PM        I personally performed the services described in this documentation, which was scribed in my presence. The recorded information has been reviewed and is accurate.   [place], DO 06/11/15 [phone]

## 2015-06-11 LAB — CBG MONITORING, ED: Glucose-Capillary: 199 mg/dL — ABNORMAL HIGH (ref 65–99)

## 2015-06-11 NOTE — Discharge Instructions (Signed)
Hyperglycemia °Hyperglycemia occurs when the glucose (sugar) in your blood is too high. Hyperglycemia can happen for many reasons, but it most often happens to people who do not know they have diabetes or are not managing their diabetes properly.  °CAUSES  °Whether you have diabetes or not, there are other causes of hyperglycemia. Hyperglycemia can occur when you have diabetes, but it can also occur in other situations that you might not be as aware of, such as: °Diabetes °· If you have diabetes and are having problems controlling your blood glucose, hyperglycemia could occur because of some of the following reasons: °¨ Not following your meal plan. °¨ Not taking your diabetes medications or not taking it properly. °¨ Exercising less or doing less activity than you normally do. °¨ Being sick. °Pre-diabetes °· This cannot be ignored. Before people develop Type 2 diabetes, they almost always have "pre-diabetes." This is when your blood glucose levels are higher than normal, but not yet high enough to be diagnosed as diabetes. Research has shown that some long-term damage to the body, especially the heart and circulatory system, may already be occurring during pre-diabetes. If you take action to manage your blood glucose when you have pre-diabetes, you may delay or prevent Type 2 diabetes from developing. °Stress °· If you have diabetes, you may be "diet" controlled or on oral medications or insulin to control your diabetes. However, you may find that your blood glucose is higher than usual in the hospital whether you have diabetes or not. This is often referred to as "stress hyperglycemia." Stress can elevate your blood glucose. This happens because of hormones put out by the body during times of stress. If stress has been the cause of your high blood glucose, it can be followed regularly by your caregiver. That way he/she can make sure your hyperglycemia does not continue to get worse or progress to  diabetes. °Steroids °· Steroids are medications that act on the infection fighting system (immune system) to block inflammation or infection. One side effect can be a rise in blood glucose. Most people can produce enough extra insulin to allow for this rise, but for those who cannot, steroids make blood glucose levels go even higher. It is not unusual for steroid treatments to "uncover" diabetes that is developing. It is not always possible to determine if the hyperglycemia will go away after the steroids are stopped. A special blood test called an A1c is sometimes done to determine if your blood glucose was elevated before the steroids were started. °SYMPTOMS °· Thirsty. °· Frequent urination. °· Dry mouth. °· Blurred vision. °· Tired or fatigue. °· Weakness. °· Sleepy. °· Tingling in feet or leg. °DIAGNOSIS  °Diagnosis is made by monitoring blood glucose in one or all of the following ways: °· A1c test. This is a chemical found in your blood. °· Fingerstick blood glucose monitoring. °· Laboratory results. °TREATMENT  °First, knowing the cause of the hyperglycemia is important before the hyperglycemia can be treated. Treatment may include, but is not be limited to: °· Education. °· Change or adjustment in medications. °· Change or adjustment in meal plan. °· Treatment for an illness, infection, etc. °· More frequent blood glucose monitoring. °· Change in exercise plan. °· Decreasing or stopping steroids. °· Lifestyle changes. °HOME CARE INSTRUCTIONS  °· Test your blood glucose as directed. °· Exercise regularly. Your caregiver will give you instructions about exercise. Pre-diabetes or diabetes which comes on with stress is helped by exercising. °· Eat wholesome,   balanced meals. Eat often and at regular, fixed times. Your caregiver or nutritionist will give you a meal plan to guide your sugar intake.  Being at an ideal weight is important. If needed, losing as little as 10 to 15 pounds may help improve blood  glucose levels. SEEK MEDICAL CARE IF:   You have questions about medicine, activity, or diet.  You continue to have symptoms (problems such as increased thirst, urination, or weight gain). SEEK IMMEDIATE MEDICAL CARE IF:   You are vomiting or have diarrhea.  Your breath smells fruity.  You are breathing faster or slower.  You are very sleepy or incoherent.  You have numbness, tingling, or pain in your feet or hands.  You have chest pain.  Your symptoms get worse even though you have been following your caregiver's orders.  If you have any other questions or concerns. Document Released: 04/10/2001 Document Revised: 01/07/2012 Document Reviewed: 02/11/2012 [facility] Patient Information 2015 [place], [place]. This information is not intended to replace advice given to you by your health care provider. Make sure you discuss any questions you have with your health care provider.  Blood Glucose Monitoring Monitoring your blood glucose (also know as blood sugar) helps you to manage your diabetes. It also helps you and your health care provider monitor your diabetes and determine how well your treatment plan is working. WHY SHOULD YOU MONITOR YOUR BLOOD GLUCOSE?  It can help you understand how food, exercise, and medicine affect your blood glucose.  It allows you to know what your blood glucose is at any given moment. You can quickly tell if you are having low blood glucose (hypoglycemia) or high blood glucose (hyperglycemia).  It can help you and your health care provider know how to adjust your medicines.  It can help you understand how to manage an illness or adjust medicine for exercise. WHEN SHOULD YOU TEST? Your health care provider will help you decide how often you should check your blood glucose. This may depend on the type of diabetes you have, your diabetes control, or the types of medicines you are taking. Be sure to write down all of your blood glucose readings so that this  information can be reviewed with your health care provider. See below for examples of testing times that your health care provider may suggest. Type 1 Diabetes  Test 4 times a day if you are in good control, using an insulin pump, or perform multiple daily injections.  If your diabetes is not well controlled or if you are sick, you may need to monitor more often.  It is a good idea to also monitor:  Before and after exercise.  Between meals and 2 hours after a meal.  Occasionally between 2:00 a.m. and 3:00 a.m. Type 2 Diabetes  It can vary with each person, but generally, if you are on insulin, test 4 times a day.  If you take medicines by mouth (orally), test 2 times a day.  If you are on a controlled diet, test once a day.  If your diabetes is not well controlled or if you are sick, you may need to monitor more often. HOW TO MONITOR YOUR BLOOD GLUCOSE Supplies Needed  Blood glucose meter.  Test strips for your meter. Each meter has its own strips. You must use the strips that go with your own meter.  A pricking needle (lancet).  A device that holds the lancet (lancing device).  A journal or log book to write down your  results. Procedure  Wash your hands with soap and water. Alcohol is not preferred.  Prick the side of your finger (not the tip) with the lancet.  Gently milk the finger until a small drop of blood appears.  Follow the instructions that come with your meter for inserting the test strip, applying blood to the strip, and using your blood glucose meter. Other Areas to Get Blood for Testing Some meters allow you to use other areas of your body (other than your finger) to test your blood. These areas are called alternative sites. The most common alternative sites are:  The forearm.  The thigh.  The back area of the lower leg.  The palm of the hand. The blood flow in these areas is slower. Therefore, the blood glucose values you get may be delayed, and  the numbers are different from what you would get from your fingers. Do not use alternative sites if you think you are having hypoglycemia. Your reading will not be accurate. Always use a finger if you are having hypoglycemia. Also, if you cannot feel your lows (hypoglycemia unawareness), always use your fingers for your blood glucose checks. ADDITIONAL TIPS FOR GLUCOSE MONITORING  Do not reuse lancets.  Always carry your supplies with you.  All blood glucose meters have a 24-hour "hotline" number to call if you have questions or need help.  Adjust (calibrate) your blood glucose meter with a control solution after finishing a few boxes of strips. BLOOD GLUCOSE RECORD KEEPING It is a good idea to keep a daily record or log of your blood glucose readings. Most glucose meters, if not all, keep your glucose records stored in the meter. Some meters come with the ability to download your records to your home computer. Keeping a record of your blood glucose readings is especially helpful if you are wanting to look for patterns. Make notes to go along with the blood glucose readings because you might forget what happened at that exact time. Keeping good records helps you and your health care provider to work together to achieve good diabetes management.  Document Released: 10/18/2003 Document Revised: 03/01/2014 Document Reviewed: 03/09/2013 [facility] Patient Information 2015 [place], [place]. This information is not intended to replace advice given to you by your health care provider. Make sure you discuss any questions you have with your health care provider.   Diabetes Mellitus and Food It is important for you to manage your blood sugar (glucose) level. Your blood glucose level can be greatly affected by what you eat. Eating healthier foods in the appropriate amounts throughout the day at about the same time each day will help you control your blood glucose level. It can also help slow or prevent worsening  of your diabetes mellitus. Healthy eating may even help you improve the level of your blood pressure and reach or maintain a healthy weight.  HOW CAN FOOD AFFECT ME? Carbohydrates Carbohydrates affect your blood glucose level more than any other type of food. Your dietitian will help you determine how many carbohydrates to eat at each meal and teach you how to count carbohydrates. Counting carbohydrates is important to keep your blood glucose at a healthy level, especially if you are using insulin or taking certain medicines for diabetes mellitus. Alcohol Alcohol can cause sudden decreases in blood glucose (hypoglycemia), especially if you use insulin or take certain medicines for diabetes mellitus. Hypoglycemia can be a life-threatening condition. Symptoms of hypoglycemia (sleepiness, dizziness, and disorientation) are similar to symptoms of having  too much alcohol.  If your health care provider has given you approval to drink alcohol, do so in moderation and use the following guidelines:  Women should not have more than one drink per day, and men should not have more than two drinks per day. One drink is equal to:  12 oz of beer.  5 oz of wine.  1 oz of hard liquor.  Do not drink on an empty stomach.  Keep yourself hydrated. Have water, diet soda, or unsweetened iced tea.  Regular soda, juice, and other mixers might contain a lot of carbohydrates and should be counted. WHAT FOODS ARE NOT RECOMMENDED? As you make food choices, it is important to remember that all foods are not the same. Some foods have fewer nutrients per serving than other foods, even though they might have the same number of calories or carbohydrates. It is difficult to get your body what it needs when you eat foods with fewer nutrients. Examples of foods that you should avoid that are high in calories and carbohydrates but low in nutrients include:  Trans fats (most processed foods list trans fats on the Nutrition Facts  label).  Regular soda.  Juice.  Candy.  Sweets, such as cake, pie, doughnuts, and cookies.  Fried foods. WHAT FOODS CAN I EAT? Have nutrient-rich foods, which will nourish your body and keep you healthy. The food you should eat also will depend on several factors, including:  The calories you need.  The medicines you take.  Your weight.  Your blood glucose level.  Your blood pressure level.  Your cholesterol level. You also should eat a variety of foods, including:  Protein, such as meat, poultry, fish, tofu, nuts, and seeds (lean animal proteins are best).  Fruits.  Vegetables.  Dairy products, such as milk, cheese, and yogurt (low fat is best).  Breads, grains, pasta, cereal, rice, and beans.  Fats such as olive oil, trans fat-free margarine, canola oil, avocado, and olives. DOES EVERYONE WITH DIABETES MELLITUS HAVE THE SAME MEAL PLAN? Because every person with diabetes mellitus is different, there is not one meal plan that works for everyone. It is very important that you meet with a dietitian who will help you create a meal plan that is just right for you. Document Released: 07/12/2005 Document Revised: 10/20/2013 Document Reviewed: 09/11/2013 [facility] Patient Information 2015 [place], [place]. This information is not intended to replace advice given to you by your health care provider. Make sure you discuss any questions you have with your health care provider.     Emergency Department Resource Guide 1) Find a Doctor and Pay Out of Pocket Although you won't have to find out who is covered by your insurance plan, it is a good idea to ask around and get recommendations. You will then need to call the office and see if the doctor you have chosen will accept you as a new patient and what types of options they offer for patients who are self-pay. Some doctors offer discounts or will set up payment plans for their patients who do not have insurance, but you will need to  ask so you aren't surprised when you get to your appointment.  2) Contact Your Local Health Department Not all health departments have doctors that can see patients for sick visits, but many do, so it is worth a call to see if yours does. If you don't know where your local health department is, you can check in your phone book. The CDC  also has a tool to help you locate your state's health department, and many state websites also have listings of all of their local health departments.  3) Find a [facility] If your illness is not likely to be very severe or complicated, you may want to try a walk in clinic. These are popping up all over the country in pharmacies, drugstores, and shopping centers. They're usually staffed by nurse practitioners or physician assistants that have been trained to treat common illnesses and complaints. They're usually fairly quick and inexpensive. However, if you have serious medical issues or chronic medical problems, these are probably not your best option.  No Primary Care Doctor: - Call Health Connect at  [phone] - they can help you locate a primary care doctor that  accepts your insurance, provides certain services, etc. - Physician Referral Service- [phone]  Chronic Pain Problems: Organization         Address  Phone   Notes  [facility]  [phone] Patients need to be referred by their primary care doctor.   Medication Assistance: Organization         Address  Phone   Notes  [facility] Medication [facility] [address] [place], [place] [address] [phone] --Must be a resident of [facility] -- Must have NO insurance coverage whatsoever (no Medicaid/ Medicare, etc.) -- The pt. MUST have a primary care doctor that directs their care regularly and follows them in the community   MedAssist  [phone]   [employer]  [phone]    Agencies that provide inexpensive medical  care: Organization         Address  Phone   Notes  [place]  [phone]   [facility]    [phone]   [facility] [place], [place] [address] [phone]   [place] [address] [place] [phone]   Planned Parenthood    [phone]   [facility]    [phone]   [place] and [place] [address], [place] Phone:  [phone], Fax:  [phone] Hours of Operation:  9 am - 6 pm, M-F.  Also accepts Medicaid/Medicare and self-pay.  [facility] for [place], [address], [place] Phone: [phone], Fax: [phone]. Hours of Operation:  8:30 am - 5:30 pm, M-F.  Also accepts Medicaid and self-pay.  [facility] [place] [address] [place] Phone: [phone]   [place], [place], [place] [phone], Ext. [phone] Mondays & Thursdays: 7-9 AM.  First 15 patients are seen on a first come, first serve basis.    [place] Providers:  Organization         Address  Phone   Notes  [facility] [address], [place] [phone] Also accepts self-pay patients.  [facility] [id number] [place], [place]  [phone]   [place], [address], [place] [phone]   [facility] [address], [place] [phone]   Lucianne Lei [address], [place]   [phone] Only accepts [place] Access [place] patients after they have their name applied to their card.   Self-Pay (no insurance) in  [facility]:  Organization         Address  Phone   Notes  Sickle Cell Patients, [facility] Internal Medicine [place] [phone]   [facility] [place] [phone]   [facility] [place]  [place], [address], [place] [phone]   [facility]/Dr. Osei-Bonsu  [address], [place] or [address], [place] [phone] Phone number for both [place] and [place] locations is the same.  [facility] [address] [place] [phone]   [facility] [address], [place] or [address] [phone]   [facility] [address], [place] [phone], phone; [phone], fax Sees patients 1st and 3rd Saturday of every month.  Must not qualify for public or private insurance (i.e. Medicaid, Medicare, [place] Health Choice, Veterans' Benefits)  Household income should be no more than 200% of the poverty level The clinic cannot treat you if you are pregnant or think you are pregnant  Sexually transmitted diseases are not treated at the clinic.    Dental Care: Organization         Address  Phone  Notes  [facility] Department of [facility] [place] [phone] Accepts children up to age 37 who are enrolled in [place] or [place]; pregnant women with a Medicaid card; and children who have applied for Medicaid or [place] Health Choice, but were declined, whose parents can pay a reduced fee at time of service.  [facility] Department of [facility]  [address], [place] [phone] Accepts children up to age 28 who are enrolled in [place] or [place]; pregnant women with a Medicaid card; and children who have applied for Medicaid or [place] Health Choice, but were declined, whose parents can pay a reduced fee at time of service.  [place] Adult [facility] Access PROGRAM  [place] [phone] Patients are seen by appointment only. Walk-ins are not accepted. [place] will see patients 59 years of age and older. Monday - Tuesday (8am-5pm) Most Wednesdays (8:30-5pm) $30 per visit, cash only  [facility] Adult Dental Access PROGRAM  [address], [facility] [phone] Patients are seen by appointment only. Walk-ins are not accepted. [place] will see patients 54 years of age and older. One Wednesday Evening (Monthly: Volunteer Based).  $30 per visit, cash only  [place]  [phone] for adults; Children under age 88, call Graduate Pediatric Dentistry at [phone]. Children aged 73-14, please call [phone] to request a pediatric application.  Dental services are provided in all areas of dental care including fillings, crowns and bridges, complete and partial dentures, implants, gum treatment, root canals, and extractions. Preventive care is also provided. Treatment is provided to both adults and children. Patients are selected via a lottery and there is often a waiting list.   [facility] [address], [place]  [phone] [email or web address]   [facility] [address] [place], [place] [phone], Ext. [phone] Second and Fourth Thursday of each month, opens at 6:30 AM; Clinic ends at 9 AM.  Patients are seen on a first-come first-served basis, and a limited number are seen during each  clinic.   [facility]  [address] Hillard Danker [employer], [place] [phone]   Eligibility Requirements You must have lived in [place], [place], or [place] counties for at least the last three months.   You cannot be eligible for state or federal sponsored [employer], including [employer], [place], or [employer].   You generally cannot be eligible for healthcare insurance through your employer.    How to apply: Eligibility screenings are held every Tuesday and Wednesday afternoon from 1:00 pm until 4:00 pm. You do not need an appointment for the interview!   [facility] [address] [place], [place]   [place]  [place] Department  [place]  [phone]    Behavioral Health Resources in the Community: Intensive Outpatient Programs Organization         Address  Phone  Notes  [place]. [address], [place], [place] [phone]   [facility] Outpatient [address] [place], [place]   ADS: Alcohol [facility] Drug S[facility] [address] [place], [place]   [place] [address],  [place], [place] or [phone]   Substance Abuse Resources Organization         Address  Phone  Notes  Alcohol and Drug Services  [phone]   [place]  [phone]   [facility] [place]   Chinita Pester  [phone]   Residential & Outpatient Substance Abuse Program  [phone]   Psychological Services Organization         Address  Phone  Notes  [facility]  [place]  [phone]   [place] [address] [place] or [phone]    Mobile Crisis Teams Organization         Address  Phone  Notes  [facility], Mobile Crisis Care Unit  [phone]   [facility]  [address] [place], [place]   [place] [address] [place] [phone]    Self-Help/Support Groups Organization         Address  Phone             Notes  [place]. of [place] - variety of support groups  [place] Call for more information  Narcotics Anonymous (NA), [facility] [address], [employer] [place]  2 meetings at this location   Special educational needs teacher         Address  Phone  Notes  [facility]idential Treatment [place],    [place]  [phone]   [facility]  [address] [place] [id number], [place], [place]   [place], [place] [phone] Admissions: 8am-3pm M-F  Incentives Substance [place] [address],    [place], [place] [phone]   [facility] [address] Jadene Pierini [place], [place]   [facility] [address]  [place], [place]   [facility] - Intensive Outpatient [address], [place], [place]   [facility] ([place].,  [place], [place] or [phone]   [facility] (RTS) [address], [place], [place] Accepts Medicaid  [facility] [place] [address]  [place]  [phone] Substance Abuse/Addiction Treatment   [facility] Organization         Address  Phone  Notes  [facility]  [phone]   Domenic Schwab, PhD [address] Arlis Porta [place], [place]   [phone] or [phone]   [place], [place] [phone]   [address], [place], [place] [phone] Insurance/Medicaid/sponsorship through [facility] [facility] [address], [address] [place]                                    [place], [place] [phone] [place] [address], [place] [phone]    Dr. Adele Schilder  [phone]   [facility] 1) [address], [place] 2) [place] 3)  [place] [address], [place] [phone]  [phone]   [place] [phone] or [phone] (After Hours)

## 2015-06-12 ENCOUNTER — Telehealth: Payer: Self-pay | Admitting: *Deleted

## 2015-06-20 ENCOUNTER — Telehealth (HOSPITAL_COMMUNITY): Payer: Self-pay

## 2015-06-20 NOTE — Telephone Encounter (Signed)
Unable to contact pt by mail or telephone. Unable to communicate lab results or treatment changes. 

## 2015-06-23 ENCOUNTER — Encounter (HOSPITAL_BASED_OUTPATIENT_CLINIC_OR_DEPARTMENT_OTHER): Payer: Medicare Other | Admitting: Clinical

## 2015-06-23 ENCOUNTER — Ambulatory Visit: Payer: Medicare Other | Attending: Family Medicine | Admitting: Physician Assistant

## 2015-06-23 VITALS — BP 156/70 | HR 98 | Temp 97.8°F | Resp 22 | Ht 67.0 in | Wt 112.2 lb

## 2015-06-23 DIAGNOSIS — Z720 Tobacco use: Secondary | ICD-10-CM | POA: Insufficient documentation

## 2015-06-23 DIAGNOSIS — E119 Type 2 diabetes mellitus without complications: Secondary | ICD-10-CM | POA: Diagnosis not present

## 2015-06-23 DIAGNOSIS — Z794 Long term (current) use of insulin: Secondary | ICD-10-CM | POA: Insufficient documentation

## 2015-06-23 DIAGNOSIS — R030 Elevated blood-pressure reading, without diagnosis of hypertension: Secondary | ICD-10-CM | POA: Insufficient documentation

## 2015-06-23 DIAGNOSIS — E1165 Type 2 diabetes mellitus with hyperglycemia: Secondary | ICD-10-CM | POA: Diagnosis not present

## 2015-06-23 DIAGNOSIS — IMO0002 Reserved for concepts with insufficient information to code with codable children: Secondary | ICD-10-CM

## 2015-06-23 DIAGNOSIS — R21 Rash and other nonspecific skin eruption: Secondary | ICD-10-CM | POA: Diagnosis not present

## 2015-06-23 DIAGNOSIS — Z789 Other specified health status: Secondary | ICD-10-CM

## 2015-06-23 LAB — BASIC METABOLIC PANEL
BUN: 9 mg/dL (ref 7–25)
CALCIUM: 8.7 mg/dL (ref 8.6–10.3)
CO2: 27 mmol/L (ref 20–31)
Chloride: 100 mmol/L (ref 98–110)
Creat: 1.01 mg/dL (ref 0.70–1.25)
GLUCOSE: 217 mg/dL — AB (ref 65–99)
Potassium: 3.3 mmol/L — ABNORMAL LOW (ref 3.5–5.3)
SODIUM: 141 mmol/L (ref 135–146)

## 2015-06-23 LAB — POCT GLYCOSYLATED HEMOGLOBIN (HGB A1C): Hemoglobin A1C: 11.6

## 2015-06-23 LAB — GLUCOSE, POCT (MANUAL RESULT ENTRY): POC Glucose: 223 mg/dl — AB (ref 70–99)

## 2015-06-23 MED ORDER — GLUCOSE BLOOD VI STRP: 1.0000 | ORAL_STRIP | Freq: Three times a day (TID) | Status: DC

## 2015-06-23 MED ORDER — INSULIN PEN NEEDLE 31G X 5 MM MISC: 10.0000 [IU] | Freq: Two times a day (BID) | Status: DC

## 2015-06-23 MED ORDER — FREESTYLE LANCETS MISC: Status: DC

## 2015-06-23 MED ORDER — HYDROCORTISONE 0.5 % EX CREA: 1.0000 "application " | TOPICAL_CREAM | Freq: Two times a day (BID) | CUTANEOUS | Status: DC

## 2015-06-23 MED ORDER — INSULIN SYRINGES (DISPOSABLE) U-100 1 ML MISC: 10.0000 [IU] | Freq: Two times a day (BID) | Status: DC

## 2015-06-23 NOTE — Addendum Note (Signed)
Addended byZettie Pho S on: 06/23/2015 11:11 AM   Modules accepted: Orders

## 2015-06-23 NOTE — Progress Notes (Signed)
ASSESSMENT: Pt currently experiencing language barrier that is affecting his health care. Pt needs to f/u with PCP at next visit; would benefit from supportive counseling regarding coping with navigating health care.  Stage of Change: precontemplative  PLAN: 1. F/U with behavioral health consultant in as needed 2. Psychiatric Medications: none. 3. Behavioral recommendation(s):   -Continue to advocate for his own health care  SUBJECTIVE: Pt. referred by Zettie Pho for possible suicidal ideation:  Pt. reports the following symptoms/concerns: Pt states that he does NOT have plans to take his life, that it was a misinterpretation. Pt does express the need for glasses because the diabetes is changing his eyesight.  Duration of problem: unknown Severity: mild  OBJECTIVE: Orientation & Cognition: Oriented x3. Thought processes normal and appropriate to situation. Mood: appropriate. Affect: appropriate Appearance: appropriate Risk of harm to self or others: no risk of harm to self or others Substance use: tobacco Assessments administered: PHQ9: 9/ GAD7: 5  Diagnosis: Language barrier affecting health care  CPT Code: Z78.9 -------------------------------------------- Other(s) present in the room: wife, daughter, [place] interpreter  Time spent with patient in exam room: 20 minutes

## 2015-06-23 NOTE — Progress Notes (Signed)
Brian Owen  [id number]  [id number]  DOB - [birth year]  Chief Complaint  Patient presents with  . Hospitalization Follow-up  . Dizziness       Subjective:   Brian Owen is a 64 y.o. male here today for follow-up from a recent emergency department visit. He was there on August 12 complaining of dizziness and headaches. His blood sugar was found to be greater than 500. He had not been on his insulin for several weeks secondary to not having any needles. He has a history of noncompliance. He was not in DKA. He was treated with IV fluids and insulin subcutaneously. His glucose improved to approximately 199 at discharge. His regimen at discharge was 70/30 0 units twice daily. He's been compliant with his regimen. He has been checking his sugar at least twice daily. He did not bring his log with him. He has no more headaches or dizziness. He has no abdominal pain or nausea. He denies any chest pain or shortness of breath.  I question how much he understands in regards to his diabetes. First of all, he states that he does not believe that he has diabetes. He continues to smoke. He will not share with me how many cigarettes he has per day.  The family is requesting something for itching. All over for weeks.   His wife and daughter are present for this interview. Also a [place] interpreter is present.  ROS: GEN: denies fever or chills, denies change in weight Skin: denies lesions or rashes HEENT: denies headache, earache, epistaxis, sore throat, or neck pain LUNGS: denies SHOB, dyspnea, PND, orthopnea CV: denies CP or palpitations ABD: denies abd pain, N or V EXT: denies muscle spasms or swelling; no pain in lower ext, no weakness NEURO: denies numbness or tingling, denies sz, stroke or TIA  ALLERGIES: No Known Allergies  PAST MEDICAL HISTORY: Past Medical History  Diagnosis Date  . Diabetes mellitus without complication   . Heavy cigarette smoker     PAST SURGICAL  HISTORY: Past Surgical History  Procedure Laterality Date  . No past surgeries      MEDICATIONS AT HOME: Prior to Admission medications   Medication Sig Start Date End Date Taking? Authorizing Provider  glucose blood test strip 1 each by Other route 3 (three) times daily. ICD 10-E11.65 06/06/15  Yes Boykin Nearing, MD  insulin NPH-regular Human (NOVOLIN 70/30) (70-30) 100 UNIT/ML injection Inject 10 Units into the skin 2 (two) times daily with a meal. 04/21/15  Yes Mercedes Camprubi-Soms, PA-C  insulin NPH-regular Human (NOVOLIN 70/30) (70-30) 100 UNIT/ML injection Inject 10 Units into the skin 2 (two) times daily with a meal. Patient not taking: Reported on 06/10/2015 03/19/15   Orlie Dakin, MD     Objective:   Filed Vitals:   06/23/15 1004  BP: 156/70  Pulse: 98  Temp: 97.8 F (36.6 C)  TempSrc: Oral  Resp: 22  Height: 5\' 7"  (1.702 m)  Weight: 112 lb 3.2 oz (50.894 kg)  SpO2: 100%    Exam General appearance : Awake, alert, not in any distress. Speech Clear. Not toxic looking Chest:Good air entry bilaterally, no added sounds  CVS: S1 S2 regular, no murmurs.  Abdomen: Bowel sounds present, Non tender and not distended with no gaurding, rigidity or rebound. Extremities: B/L Lower Ext shows no edema, both legs are warm to touch Neurology: Awake alert, and oriented X 3, CN II-XII intact, Non focal Skin:No Rash Wounds:N/A  Data Review Lab Results  Component Value  Date   HGBA1C 11.60 06/23/2015   HGBA1C 15.7* 07/22/2014   HGBA1C 13.7* 06/23/2014   CBG was 223  Assessment & Plan  1. Insulin-dependent diabetes mellitus, uncontrolled  -Attempted education today, I'm not sure how receptive he was  -Continue 70/30 insulin subcutaneously twice a day  -Increase Accu-Cheks to 3 times daily and keep a log. Bring this log to your appointment in one  week with the nurse and again in 2 weeks with Dr. Adrian Blackwater  -Refill test strips, syringes, needles  -BMP today 2.  Smoker  -Cessation discussed 3. Elevated blood pressure  -Recheck in 1 week with nurse  -No medication prescribed at this time  -Low salt diet 4. Rash  -hydrocortisone cream   1 week for nurse education in regards to diabetes 2 weeks with Dr. Adrian Blackwater LOS greater than 44 minutes discussing DM and the causes and treatment modalities.  The patient was given clear instructions to go to ER or return to medical center if symptoms don't improve, worsen or new problems develop. The patient verbalized understanding. The patient was told to call to get lab results if they haven't heard anything in the next week.   This note has been created with Surveyor, quantity. Any transcriptional errors are unintentional.    Zettie Pho, PA-C [facility] [place], [place]   06/23/2015, 10:28 AM

## 2015-06-23 NOTE — Progress Notes (Addendum)
Spoke with patient via Pharmacologist, [employer] Patient presents as HFU for dizziness Denies dizziness at present Ambulates with cane Quit smoking July 2015 PMH: T2DM CBG 223 2 hours afrer meal Daughter and wife brought into exam room Patient/daughter given blood sugar log and instructed on use. Instructed to bring to all future visits. Discussed The Plate method and limiting carbs, especially rice   PHQ-9 score of 9 GAD-7 score of 5 Provider made aware Referred to LCSW

## 2015-06-27 ENCOUNTER — Telehealth: Payer: Self-pay | Admitting: Family Medicine

## 2015-06-27 DIAGNOSIS — IMO0002 Reserved for concepts with insufficient information to code with codable children: Secondary | ICD-10-CM

## 2015-06-27 DIAGNOSIS — E1165 Type 2 diabetes mellitus with hyperglycemia: Secondary | ICD-10-CM

## 2015-06-27 NOTE — Telephone Encounter (Signed)
Patients daughter came in requesting instructions for new prescribed medications, Lancets (FREESTYLE) And Insulin Pen Needle 31G X 5 MM MISC. Patients daughter also requested a new Accu Check Pen. Please follow up.

## 2015-06-27 NOTE — Telephone Encounter (Signed)
Pt had F/U appointment with [facility] on 8/25. No further needs from this CM.

## 2015-06-29 ENCOUNTER — Other Ambulatory Visit: Payer: Self-pay | Admitting: Physician Assistant

## 2015-06-29 MED ORDER — POTASSIUM CHLORIDE CRYS ER 20 MEQ PO TBCR: 20.0000 meq | EXTENDED_RELEASE_TABLET | Freq: Every day | ORAL | Status: DC

## 2015-07-01 ENCOUNTER — Ambulatory Visit: Payer: Medicare Other | Attending: Family Medicine | Admitting: *Deleted

## 2015-07-01 VITALS — BP 128/74 | HR 92 | Temp 98.0°F | Resp 20

## 2015-07-01 DIAGNOSIS — E1165 Type 2 diabetes mellitus with hyperglycemia: Secondary | ICD-10-CM | POA: Insufficient documentation

## 2015-07-01 DIAGNOSIS — Z794 Long term (current) use of insulin: Secondary | ICD-10-CM | POA: Diagnosis not present

## 2015-07-01 DIAGNOSIS — Z87891 Personal history of nicotine dependence: Secondary | ICD-10-CM | POA: Diagnosis not present

## 2015-07-01 DIAGNOSIS — Z8679 Personal history of other diseases of the circulatory system: Secondary | ICD-10-CM | POA: Diagnosis not present

## 2015-07-01 MED ORDER — GLUCOSE BLOOD VI STRP: ORAL_STRIP | Status: DC

## 2015-07-01 MED ORDER — ACCU-CHEK FASTCLIX LANCETS MISC: Status: DC

## 2015-07-01 MED ORDER — INSULIN NPH ISOPHANE & REGULAR (70-30) 100 UNIT/ML ~~LOC~~ SUSP: 10.0000 [IU] | Freq: Two times a day (BID) | SUBCUTANEOUS | Status: DC

## 2015-07-01 NOTE — Progress Notes (Signed)
Spoke with patient via Pharmacologist, Beatrix Shipper Patient presents with wife and daughters for BP check, CBG and record review for T2DM, however, patient did not bring log  Med list reviewed; patient reports taking 10 units 70/30 insulin bid AC Was unaware to start potassium supplement. Lab from last OV reviewed with patient/wife and patient will begin taking today Patient's wife reports AM fasting blood sugars ranging 142-200 Reports before lunch blood sugars ranging 120-300 Reports before dinner blood sugars ranging 130-300 Reports before bed blood sugars ranging 80-400 Denies BS < 70 Patient denies increased thirst and urination,  Positive for blurred vision; states he needs eye glasses Smoking 2 cigs per day; wants to quit   CBG 131 AM fasting per patient's wife States patient just finished eating whopper and fries and unsweet tea at [employer] so CBG not repeated Again discussed The Plate Method and choosing low carb options  Lab Results  Component Value Date   HGBA1C 11.60 06/23/2015   Filed Vitals:   07/01/15 1207  BP: 128/74  Pulse: 92  Temp: 98 F (36.7 C)  Resp: 20     Patient has accu-chek nano Refills for testing strips and lancets e-scribed to [facility] on [address]  Patient advised to continue taking 70/30 insulin 10 units bid AC and return in 1 week to establish care with PCP with BS log

## 2015-07-22 ENCOUNTER — Encounter (HOSPITAL_COMMUNITY): Payer: Self-pay | Admitting: Emergency Medicine

## 2015-07-22 ENCOUNTER — Emergency Department (HOSPITAL_COMMUNITY): Payer: Medicare Other

## 2015-07-22 ENCOUNTER — Inpatient Hospital Stay (HOSPITAL_COMMUNITY)
Admission: EM | Admit: 2015-07-22 | Discharge: 2015-07-24 | DRG: 639 | Disposition: A | Discharge status: Home / Self Care | Payer: Medicare Other | Attending: Internal Medicine | Admitting: Internal Medicine

## 2015-07-22 DIAGNOSIS — Z87891 Personal history of nicotine dependence: Secondary | ICD-10-CM

## 2015-07-22 DIAGNOSIS — R4182 Altered mental status, unspecified: Secondary | ICD-10-CM

## 2015-07-22 DIAGNOSIS — Z79899 Other long term (current) drug therapy: Secondary | ICD-10-CM

## 2015-07-22 DIAGNOSIS — E11649 Type 2 diabetes mellitus with hypoglycemia without coma: Principal | ICD-10-CM | POA: Diagnosis present

## 2015-07-22 DIAGNOSIS — T68XXXA Hypothermia, initial encounter: Secondary | ICD-10-CM

## 2015-07-22 DIAGNOSIS — E161 Other hypoglycemia: Secondary | ICD-10-CM | POA: Diagnosis not present

## 2015-07-22 DIAGNOSIS — Z794 Long term (current) use of insulin: Secondary | ICD-10-CM

## 2015-07-22 DIAGNOSIS — E1165 Type 2 diabetes mellitus with hyperglycemia: Secondary | ICD-10-CM

## 2015-07-22 DIAGNOSIS — R410 Disorientation, unspecified: Secondary | ICD-10-CM | POA: Diagnosis present

## 2015-07-22 DIAGNOSIS — A419 Sepsis, unspecified organism: Secondary | ICD-10-CM

## 2015-07-22 DIAGNOSIS — E162 Hypoglycemia, unspecified: Secondary | ICD-10-CM

## 2015-07-22 DIAGNOSIS — R680 Hypothermia, not associated with low environmental temperature: Secondary | ICD-10-CM | POA: Diagnosis not present

## 2015-07-22 DIAGNOSIS — Z8619 Personal history of other infectious and parasitic diseases: Secondary | ICD-10-CM

## 2015-07-22 DIAGNOSIS — R7309 Other abnormal glucose: Secondary | ICD-10-CM | POA: Diagnosis not present

## 2015-07-22 LAB — I-STAT CHEM 8, ED
BUN: 18 mg/dL (ref 6–20)
CREATININE: 1.1 mg/dL (ref 0.61–1.24)
Calcium, Ion: 1.29 mmol/L (ref 1.13–1.30)
Chloride: 103 mmol/L (ref 101–111)
GLUCOSE: 153 mg/dL — AB (ref 65–99)
HCT: 39 % (ref 39.0–52.0)
HEMOGLOBIN: 13.3 g/dL (ref 13.0–17.0)
Potassium: 3.9 mmol/L (ref 3.5–5.1)
Sodium: 141 mmol/L (ref 135–145)
TCO2: 24 mmol/L (ref 0–100)

## 2015-07-22 LAB — LACTIC ACID, PLASMA: LACTIC ACID, VENOUS: 3.8 mmol/L — AB (ref 0.5–2.0)

## 2015-07-22 LAB — CBC
HCT: 36.9 % — ABNORMAL LOW (ref 39.0–52.0)
Hemoglobin: 12.4 g/dL — ABNORMAL LOW (ref 13.0–17.0)
MCH: 30.2 pg (ref 26.0–34.0)
MCHC: 33.6 g/dL (ref 30.0–36.0)
MCV: 90 fL (ref 78.0–100.0)
PLATELETS: 175 10*3/uL (ref 150–400)
RBC: 4.1 MIL/uL — ABNORMAL LOW (ref 4.22–5.81)
RDW: 13 % (ref 11.5–15.5)
WBC: 7.9 10*3/uL (ref 4.0–10.5)

## 2015-07-22 LAB — COMPREHENSIVE METABOLIC PANEL
ALT: 18 U/L (ref 17–63)
ANION GAP: 8 (ref 5–15)
AST: 21 U/L (ref 15–41)
Albumin: 4.1 g/dL (ref 3.5–5.0)
Alkaline Phosphatase: 110 U/L (ref 38–126)
BILIRUBIN TOTAL: 0.4 mg/dL (ref 0.3–1.2)
BUN: 19 mg/dL (ref 6–20)
CO2: 27 mmol/L (ref 22–32)
Calcium: 9.5 mg/dL (ref 8.9–10.3)
Chloride: 105 mmol/L (ref 101–111)
Creatinine, Ser: 1.09 mg/dL (ref 0.61–1.24)
Glucose, Bld: 157 mg/dL — ABNORMAL HIGH (ref 65–99)
POTASSIUM: 4 mmol/L (ref 3.5–5.1)
Sodium: 140 mmol/L (ref 135–145)
TOTAL PROTEIN: 7.6 g/dL (ref 6.5–8.1)

## 2015-07-22 LAB — CBG MONITORING, ED
GLUCOSE-CAPILLARY: 155 mg/dL — AB (ref 65–99)
GLUCOSE-CAPILLARY: 96 mg/dL (ref 65–99)
GLUCOSE-CAPILLARY: 25 mg/dL — AB (ref 65–99)
GLUCOSE-CAPILLARY: 186 mg/dL — AB (ref 65–99)
Glucose-Capillary: 29 mg/dL — CL (ref 65–99)
Glucose-Capillary: 190 mg/dL — ABNORMAL HIGH (ref 65–99)

## 2015-07-22 LAB — TROPONIN I

## 2015-07-22 LAB — URINALYSIS, ROUTINE W REFLEX MICROSCOPIC
BILIRUBIN URINE: NEGATIVE
GLUCOSE, UA: 100 mg/dL — AB
KETONES UR: NEGATIVE mg/dL
Leukocytes, UA: NEGATIVE
Nitrite: NEGATIVE
PROTEIN: NEGATIVE mg/dL
Specific Gravity, Urine: 1.01 (ref 1.005–1.030)
UROBILINOGEN UA: 0.2 mg/dL (ref 0.0–1.0)
pH: 6 (ref 5.0–8.0)

## 2015-07-22 LAB — URINE MICROSCOPIC-ADD ON

## 2015-07-22 LAB — MRSA PCR SCREENING: MRSA by PCR: NEGATIVE

## 2015-07-22 MED ORDER — INSULIN ASPART 100 UNIT/ML ~~LOC~~ SOLN
0.0000 [IU] | SUBCUTANEOUS | Status: DC
  Administered 2015-07-22: 2 [IU] via SUBCUTANEOUS
  Administered 2015-07-23: 3 [IU] via SUBCUTANEOUS
  Administered 2015-07-23: 1 [IU] via SUBCUTANEOUS
  Administered 2015-07-24: 2 [IU] via SUBCUTANEOUS
  Administered 2015-07-24: 1 [IU] via SUBCUTANEOUS

## 2015-07-22 MED ORDER — HEPARIN SODIUM (PORCINE) 5000 UNIT/ML IJ SOLN
5000.0000 [IU] | Freq: Three times a day (TID) | INTRAMUSCULAR | Status: DC
  Administered 2015-07-22 – 2015-07-24 (×5): 5000 [IU] via SUBCUTANEOUS
  Filled 2015-07-22 (×5): qty 1

## 2015-07-22 MED ORDER — DEXTROSE 50 % IV SOLN
1.0000 | Freq: Once | INTRAVENOUS | Status: AC
  Administered 2015-07-22: 50 mL via INTRAVENOUS
  Filled 2015-07-22: qty 50

## 2015-07-22 MED ORDER — SODIUM CHLORIDE 0.9 % IV SOLN
1000.0000 mL | INTRAVENOUS | Status: DC
  Administered 2015-07-22: 1000 mL via INTRAVENOUS

## 2015-07-22 MED ORDER — PIPERACILLIN-TAZOBACTAM 3.375 G IVPB
3.3750 g | Freq: Once | INTRAVENOUS | Status: AC
  Administered 2015-07-22: 3.375 g via INTRAVENOUS
  Filled 2015-07-22: qty 50

## 2015-07-22 MED ORDER — POTASSIUM CHLORIDE CRYS ER 20 MEQ PO TBCR
20.0000 meq | EXTENDED_RELEASE_TABLET | Freq: Every day | ORAL | Status: DC
  Administered 2015-07-23 – 2015-07-24 (×2): 20 meq via ORAL
  Filled 2015-07-22 (×2): qty 1

## 2015-07-22 MED ORDER — DEXTROSE 5 % IV SOLN
INTRAVENOUS | Status: AC
  Administered 2015-07-22: 21:00:00 via INTRAVENOUS

## 2015-07-22 MED ORDER — SODIUM CHLORIDE 0.9 % IV SOLN
1000.0000 mL | Freq: Once | INTRAVENOUS | Status: AC
  Administered 2015-07-22: 1000 mL via INTRAVENOUS

## 2015-07-22 MED ORDER — ACETAMINOPHEN 500 MG PO TABS: 1000.0000 mg | ORAL_TABLET | Freq: Four times a day (QID) | ORAL | Status: DC | PRN

## 2015-07-22 MED ORDER — VANCOMYCIN HCL IN DEXTROSE 1-5 GM/200ML-% IV SOLN
1000.0000 mg | Freq: Once | INTRAVENOUS | Status: AC
  Administered 2015-07-22: 1000 mg via INTRAVENOUS
  Filled 2015-07-22: qty 200

## 2015-07-22 MED ORDER — DEXTROSE-NACL 5-0.9 % IV SOLN
INTRAVENOUS | Status: DC
Start: 2015-07-22 — End: 2015-07-23
  Administered 2015-07-22: 19:00:00 via INTRAVENOUS

## 2015-07-22 NOTE — ED Provider Notes (Addendum)
CSN: [id number]     Arrival date & time 07/22/15  1500 History   First MD Initiated Contact with Patient 07/22/15 1514     Chief Complaint  Patient presents with  . Hypoglycemia    Level V caveat: Altered mental status.  HPI Patient is brought to the emergency department for altered mental status.  EMS found the patient be hypoglycemic with a initial CBG of 18.  He was given oral glucose followed by peanut butter crackers.  His blood sugar on arrival to emergency department as 140.  Family reports his had decreased oral intake over the past 24 hours.  He's been otherwise compliant with his medications.  No reports of vomiting.  Family reports no diarrhea.  Family reports no documented fever however the patient is found to be hypothermic on arrival with a rectal temperature of 92.7.  Warming measures in place at time of my evaluation.  Family notes no rash.   Past Medical History  Diagnosis Date  . Diabetes mellitus without complication   . Heavy cigarette smoker    Past Surgical History  Procedure Laterality Date  . No past surgeries     No family history on file. Social History  Substance Use Topics  . Smoking status: Former Smoker -- 40 years    Types: Cigarettes    Quit date: 04/28/2014  . Smokeless tobacco: Former Systems developer    Quit date: 03/29/2014  . Alcohol Use: No    Review of Systems  Unable to perform ROS: Mental status change      Allergies  Review of patient's allergies indicates no known allergies.  Home Medications   Prior to Admission medications   Medication Sig Start Date End Date Taking? Authorizing Provider  ACCU-CHEK FASTCLIX LANCETS MISC Check BS 4 times daily before meals and at bedtime 07/01/15  Yes Tiffany Daneil Dan, PA-C  acetaminophen (TYLENOL) 500 MG tablet Take 1,000 mg by mouth every 6 (six) hours as needed for mild pain or headache.   Yes Historical Provider, MD  glucose blood (ACCU-CHEK SMARTVIEW) test strip Use as instructed 07/01/15  Yes Tiffany Daneil Dan, PA-C  hydrocortisone cream 0.5 % Apply 1 application topically 2 (two) times daily. 06/23/15  Yes Tiffany Daneil Dan, PA-C  insulin NPH-regular Human (NOVOLIN 70/30) (70-30) 100 UNIT/ML injection Inject 10 Units into the skin 2 (two) times daily with a meal. 07/01/15  Yes Tiffany S Noel, PA-C  Insulin Syringes, Disposable, U-100 1 ML MISC 10 Units by Does not apply route 2 (two) times daily. 06/23/15  Yes Tiffany Daneil Dan, PA-C  potassium chloride SA (K-DUR,KLOR-CON) 20 MEQ tablet Take 1 tablet (20 mEq total) by mouth daily. 06/29/15  Yes Tiffany S Noel, PA-C   BP 136/65 mmHg  Pulse 96  Temp(Src) 96.2 F (35.7 C) (Rectal)  Resp 21  Ht 5\' 5"  (1.651 m)  Wt 112 lb (50.803 kg)  BMI 18.64 kg/m2  SpO2 98% Physical Exam  Constitutional: He appears well-developed and well-nourished.  HENT:  Head: Normocephalic and atraumatic.  Eyes: EOM are normal.  Neck: Normal range of motion.  Cardiovascular: Normal rate, regular rhythm, normal heart sounds and intact distal pulses.   Pulmonary/Chest: Effort normal and breath sounds normal. No respiratory distress.  Abdominal: Soft. He exhibits no distension. There is no tenderness.  Musculoskeletal: Normal range of motion.  Neurological: He is alert.  Oriented 2.  Follow simple commands.  Moves all 4 extremities equally.  Skin: Skin is warm and dry.  Psychiatric: He  has a normal mood and affect. Judgment normal.  Nursing note and vitals reviewed.   ED Course  Procedures (including critical care time)  CRITICAL CARE Performed by: Hoy Morn Total critical care time: 35 Critical care time was exclusive of separately billable procedures and treating other patients. Critical care was necessary to treat or prevent imminent or life-threatening deterioration. Critical care was time spent personally by me on the following activities: development of treatment plan with patient and/or surrogate as well as nursing, discussions with consultants, evaluation of  patient's response to treatment, examination of patient, obtaining history from patient or surrogate, ordering and performing treatments and interventions, ordering and review of laboratory studies, ordering and review of radiographic studies, pulse oximetry and re-evaluation of patient's condition.   Labs Review Labs Reviewed  CBC - Abnormal; Notable for the following:    RBC 4.10 (*)    Hemoglobin 12.4 (*)    HCT 36.9 (*)    All other components within normal limits  COMPREHENSIVE METABOLIC PANEL - Abnormal; Notable for the following:    Glucose, Bld 157 (*)    All other components within normal limits  LACTIC ACID, PLASMA - Abnormal; Notable for the following:    Lactic Acid, Venous 3.8 (*)    All other components within normal limits  I-STAT CHEM 8, ED - Abnormal; Notable for the following:    Glucose, Bld 153 (*)    All other components within normal limits  CBG MONITORING, ED - Abnormal; Notable for the following:    Glucose-Capillary 155 (*)    All other components within normal limits  CBG MONITORING, ED - Abnormal; Notable for the following:    Glucose-Capillary 25 (*)    All other components within normal limits  CBG MONITORING, ED - Abnormal; Notable for the following:    Glucose-Capillary 29 (*)    All other components within normal limits  CULTURE, BLOOD (ROUTINE X 2)  CULTURE, BLOOD (ROUTINE X 2)  URINE CULTURE  TROPONIN I  URINALYSIS, ROUTINE W REFLEX MICROSCOPIC (NOT AT [facility])  CBG MONITORING, ED    Imaging Review Ct Head Wo Contrast  07/22/2015   CLINICAL DATA:  Patient with altered mental status.  EXAM: CT HEAD WITHOUT CONTRAST  TECHNIQUE: Contiguous axial images were obtained from the base of the skull through the vertex without intravenous contrast.  COMPARISON:  Brain CT 05/10/2014  FINDINGS: Ventricles and sulci are appropriate for patient's age. No evidence for acute cortically based infarct, intracranial hemorrhage, mass lesion or mass-effect.  Periventricular and subcortical white matter hypodensity compatible with chronic small vessel ischemic changes. Orbits are unremarkable. Mucosal thickening involving the left maxillary sinus and ethmoid air cells. Mastoid air cells are unremarkable. Calvarium is intact.  IMPRESSION: No acute intracranial process. Chronic small vessel ischemic changes.   Electronically Signed   By: Lovey Newcomer M.D.   On: 07/22/2015 17:45   Dg Chest Portable 1 View  07/22/2015   CLINICAL DATA:  Altered mental status  EXAM: PORTABLE CHEST 1 VIEW  COMPARISON:  05/06/2015  FINDINGS: The heart size and mediastinal contours are within normal limits. Both lungs are clear. The visualized skeletal structures are unremarkable.  IMPRESSION: No active disease.   Electronically Signed   By: Marybelle Killings M.D.   On: 07/22/2015 16:04   I have personally reviewed and evaluated these images and lab results as part of my medical decision-making.  ECG interpretation  Date: 07/22/2015  Rate: 93  Rhythm: normal sinus rhythm  QRS Axis:  normal  Intervals: normal  ST/T Wave abnormalities: normal  Conduction Disutrbances: none  Narrative Interpretation:   Old EKG Reviewed: No significant changes noted     MDM   Final diagnoses:  Hypothermia, initial encounter  Sepsis, due to unspecified organism  Altered mental status, unspecified altered mental status type  Hypoglycemia    6:27 PM Patient with recurrent hypoglycemia here in the emergency department.  He'll be started on D5 normal saline at 75 cc an hour.  Patient be admitted for sepsis.  History of MSSA bacteremia in the past.  Thank and Zosyn given.  Hypothermia improving with bear hugger.  Currently up to 96.2.  No hypotension.  Urine pending at this time.  Patient will need admission to stepdown unit.  Altered mental status is likely delirium.     Jola Schmidt, MD 07/22/15 [place], MD 07/22/15 [phone]

## 2015-07-22 NOTE — H&P (Signed)
[facility] History and Physical  Rigby Laque [id number] DOB: 05/18/1951 DOA: 07/22/2015  Referring physician: EDP PCP: [name], MD   Chief Complaint: Hypoglycemia   HPI: Brian Owen is a 64 y.o. male found to be altered at home by family today.  Poor PO intake over past couple of days.  EMS called and found patients BGL to be 18.  Treated and brought to ED.  Improvement to BGL in ED of 140 has significantly improved mental status.  Initially hypothermic to 92.7 his mental status continues to improve, T now up to 96.2 with bair hugger.  Review of Systems: Systems reviewed.  As above, otherwise negative  Past Medical History  Diagnosis Date  . Diabetes mellitus without complication   . Heavy cigarette smoker    Past Surgical History  Procedure Laterality Date  . No past surgeries     Social History:  reports that he quit smoking about 14 months ago. His smoking use included Cigarettes. He quit after 40 years of use. He quit smokeless tobacco use about 15 months ago. He reports that he does not drink alcohol or use illicit drugs.  No Known Allergies  No family history on file.   Prior to Admission medications   Medication Sig Start Date End Date Taking? Authorizing Provider  ACCU-CHEK FASTCLIX LANCETS MISC Check BS 4 times daily before meals and at bedtime 07/01/15  Yes Tiffany Daneil Dan, PA-C  acetaminophen (TYLENOL) 500 MG tablet Take 1,000 mg by mouth every 6 (six) hours as needed for mild pain or headache.   Yes Historical Provider, MD  glucose blood (ACCU-CHEK SMARTVIEW) test strip Use as instructed 07/01/15  Yes Tiffany Daneil Dan, PA-C  hydrocortisone cream 0.5 % Apply 1 application topically 2 (two) times daily. 06/23/15  Yes Tiffany Daneil Dan, PA-C  insulin NPH-regular Human (NOVOLIN 70/30) (70-30) 100 UNIT/ML injection Inject 10 Units into the skin 2 (two) times daily with a meal. 07/01/15  Yes Tiffany S Noel, PA-C  Insulin Syringes, Disposable, U-100 1 ML MISC 10 Units  by Does not apply route 2 (two) times daily. 06/23/15  Yes Tiffany Daneil Dan, PA-C  potassium chloride SA (K-DUR,KLOR-CON) 20 MEQ tablet Take 1 tablet (20 mEq total) by mouth daily. 06/29/15  Yes Brayton Caves, PA-C   Physical Exam: Filed Vitals:   07/22/15 1900  BP: 124/71  Pulse: 74  Temp:   Resp: 18    BP 124/71 mmHg  Pulse 74  Temp(Src) 96.2 F (35.7 C) (Rectal)  Resp 18  Ht 5\' 5"  (1.651 m)  Wt 50.803 kg (112 lb)  BMI 18.64 kg/m2  SpO2 98%  General Appearance:    Alert, oriented, no distress, appears stated age  Head:    Normocephalic, atraumatic  Eyes:    PERRL, EOMI, sclera non-icteric        Nose:   Nares without drainage or epistaxis. Mucosa, turbinates normal  Throat:   Moist mucous membranes. Oropharynx without erythema or exudate.  Neck:   Supple. No carotid bruits.  No thyromegaly.  No lymphadenopathy.   Back:     No CVA tenderness, no spinal tenderness  Lungs:     Clear to auscultation bilaterally, without wheezes, rhonchi or rales  Chest wall:    No tenderness to palpitation  Heart:    Regular rate and rhythm without murmurs, gallops, rubs  Abdomen:     Soft, non-tender, nondistended, normal bowel sounds, no organomegaly  Genitalia:    deferred  Rectal:  deferred  Extremities:   No clubbing, cyanosis or edema.  Pulses:   2+ and symmetric all extremities  Skin:   Skin color, texture, turgor normal, no rashes or lesions  Lymph nodes:   Cervical, supraclavicular, and axillary nodes normal  Neurologic:   CNII-XII intact. Normal strength, sensation and reflexes      throughout    Labs on Admission:  Basic Metabolic Panel:  Recent Labs Lab 07/22/15 1529 07/22/15 1542  NA 140 141  K 4.0 3.9  CL 105 103  CO2 27  --   GLUCOSE 157* 153*  BUN 19 18  CREATININE 1.09 1.10  CALCIUM 9.5  --    Liver Function Tests:  Recent Labs Lab 07/22/15 1529  AST 21  ALT 18  ALKPHOS 110  BILITOT 0.4  PROT 7.6  ALBUMIN 4.1   No results for input(s): LIPASE,  AMYLASE in the last 168 hours. No results for input(s): AMMONIA in the last 168 hours. CBC:  Recent Labs Lab 07/22/15 1539 07/22/15 1542  WBC 7.9  --   HGB 12.4* 13.3  HCT 36.9* 39.0  MCV 90.0  --   PLT 175  --    Cardiac Enzymes:  Recent Labs Lab 07/22/15 1529  TROPONINI <0.03    BNP (last 3 results) No results for input(s): PROBNP in the last 8760 hours. CBG:  Recent Labs Lab 07/22/15 1511 07/22/15 1628 07/22/15 1812 07/22/15 1818 07/22/15 1904  GLUCAP 155* 96 25* 29* 190*    Radiological Exams on Admission: Ct Head Wo Contrast  07/22/2015   CLINICAL DATA:  Patient with altered mental status.  EXAM: CT HEAD WITHOUT CONTRAST  TECHNIQUE: Contiguous axial images were obtained from the base of the skull through the vertex without intravenous contrast.  COMPARISON:  Brain CT 05/10/2014  FINDINGS: Ventricles and sulci are appropriate for patient's age. No evidence for acute cortically based infarct, intracranial hemorrhage, mass lesion or mass-effect. Periventricular and subcortical white matter hypodensity compatible with chronic small vessel ischemic changes. Orbits are unremarkable. Mucosal thickening involving the left maxillary sinus and ethmoid air cells. Mastoid air cells are unremarkable. Calvarium is intact.  IMPRESSION: No acute intracranial process. Chronic small vessel ischemic changes.   Electronically Signed   By: Lovey Newcomer M.D.   On: 07/22/2015 17:45   Dg Chest Portable 1 View  07/22/2015   CLINICAL DATA:  Altered mental status  EXAM: PORTABLE CHEST 1 VIEW  COMPARISON:  05/06/2015  FINDINGS: The heart size and mediastinal contours are within normal limits. Both lungs are clear. The visualized skeletal structures are unremarkable.  IMPRESSION: No active disease.   Electronically Signed   By: Marybelle Killings M.D.   On: 07/22/2015 16:04    EKG: Independently reviewed.  Assessment/Plan Principal Problem:   Hypoglycemia Active Problems:   Diabetes mellitus type  2, uncontrolled   Hypothermia   1. Hypoglycemia - 1. D5 2. CBG checks Q2H 2. DM2 - 1. Hold home insulin (takes 10 units 70/30 BID) 2. Putting patient on SSI low dose Q4H 3. Hypothermia - 1. improving on bair hugger 2. BCx and UA are pending but no other SIRS criteria to suggest infection or source.    Code Status: Full  Family Communication: Family at bedside Disposition Plan: Admit to obs   Time spent: 50 min  GARDNER, JARED M. [facility] Pager [phone]  If 7AM-7PM, please contact the day team taking care of the patient [email or web address] Password [facility] 07/22/2015, 7:24 PM

## 2015-07-22 NOTE — ED Notes (Signed)
Attempted in and out cath with no urine output.

## 2015-07-22 NOTE — ED Notes (Signed)
Bed: RESB Expected date:  Expected time:  Means of arrival:  Comments: Hold-hypoglycemia

## 2015-07-22 NOTE — ED Notes (Signed)
Pt ambulated to restroom said he needed to go #2 possible urination

## 2015-07-22 NOTE — ED Notes (Signed)
Pt reminded of need for urine specimen 

## 2015-07-22 NOTE — ED Notes (Signed)
Per EMS-patient woke up from nap-family states he would not get up-acting altered-CBG fire took was 18, and was given 25g of oral glucose CBG 38-became a little more responsive-ate some peanut butter crackers-CBG 140 after 12 1/2g of dextrose-fine ever since, responsive, normal baseline per family

## 2015-07-23 DIAGNOSIS — Z87891 Personal history of nicotine dependence: Secondary | ICD-10-CM | POA: Diagnosis not present

## 2015-07-23 DIAGNOSIS — E162 Hypoglycemia, unspecified: Secondary | ICD-10-CM | POA: Diagnosis not present

## 2015-07-23 DIAGNOSIS — Z8619 Personal history of other infectious and parasitic diseases: Secondary | ICD-10-CM | POA: Diagnosis not present

## 2015-07-23 DIAGNOSIS — E1165 Type 2 diabetes mellitus with hyperglycemia: Secondary | ICD-10-CM | POA: Diagnosis not present

## 2015-07-23 DIAGNOSIS — Z79899 Other long term (current) drug therapy: Secondary | ICD-10-CM | POA: Diagnosis not present

## 2015-07-23 DIAGNOSIS — Z794 Long term (current) use of insulin: Secondary | ICD-10-CM | POA: Diagnosis not present

## 2015-07-23 DIAGNOSIS — E11649 Type 2 diabetes mellitus with hypoglycemia without coma: Secondary | ICD-10-CM | POA: Diagnosis present

## 2015-07-23 DIAGNOSIS — R410 Disorientation, unspecified: Secondary | ICD-10-CM | POA: Diagnosis present

## 2015-07-23 DIAGNOSIS — T68XXXD Hypothermia, subsequent encounter: Secondary | ICD-10-CM | POA: Diagnosis not present

## 2015-07-23 DIAGNOSIS — R680 Hypothermia, not associated with low environmental temperature: Secondary | ICD-10-CM | POA: Diagnosis present

## 2015-07-23 LAB — BASIC METABOLIC PANEL
Anion gap: 7 (ref 5–15)
BUN: 12 mg/dL (ref 6–20)
CO2: 27 mmol/L (ref 22–32)
CREATININE: 0.96 mg/dL (ref 0.61–1.24)
Calcium: 9.1 mg/dL (ref 8.9–10.3)
Chloride: 107 mmol/L (ref 101–111)
GFR calc Af Amer: >60 mL/min (ref >60)
Glucose, Bld: 84 mg/dL (ref 65–99)
Potassium: 3.6 mmol/L (ref 3.5–5.1)
SODIUM: 141 mmol/L (ref 135–145)

## 2015-07-23 LAB — GLUCOSE, CAPILLARY
GLUCOSE-CAPILLARY: 73 mg/dL (ref 65–99)
GLUCOSE-CAPILLARY: 235 mg/dL — AB (ref 65–99)
GLUCOSE-CAPILLARY: 76 mg/dL (ref 65–99)
Glucose-Capillary: 106 mg/dL — ABNORMAL HIGH (ref 65–99)
Glucose-Capillary: 146 mg/dL — ABNORMAL HIGH (ref 65–99)
Glucose-Capillary: 155 mg/dL — ABNORMAL HIGH (ref 65–99)
Glucose-Capillary: 41 mg/dL — CL (ref 65–99)
Glucose-Capillary: 67 mg/dL (ref 65–99)
Glucose-Capillary: 71 mg/dL (ref 65–99)
Glucose-Capillary: 73 mg/dL (ref 65–99)

## 2015-07-23 LAB — CORTISOL: Cortisol, Plasma: 11.6 ug/dL

## 2015-07-23 MED ORDER — INSULIN NPH (HUMAN) (ISOPHANE) 100 UNIT/ML ~~LOC~~ SUSP
4.0000 [IU] | Freq: Two times a day (BID) | SUBCUTANEOUS | Status: DC
  Administered 2015-07-23 – 2015-07-24 (×3): 4 [IU] via SUBCUTANEOUS
  Filled 2015-07-23 (×2): qty 10

## 2015-07-23 NOTE — Care Management Note (Signed)
Case Management Note  Patient Details  Name: Brian Owen MRN: [id number] Date of Birth: 1951-09-23  Subjective/Objective:                   Hypoglycemia  Action/Plan: Discharge planning, medication needs  Expected Discharge Date:   (unknown)               Expected Discharge Plan:     In-House Referral:     Discharge planning Services  CM Consult, Medication Assistance  Post Acute Care Choice:    Choice offered to:     DME Arranged:    DME Agency:     HH Arranged:    HH Agency:     Status of Service:  In process, will continue to follow  Medicare Important Message Given:    Date Medicare IM Given:    Medicare IM give by:    Date Additional Medicare IM Given:    Additional Medicare Important Message give by:     If discussed at [place] of Stay Meetings, dates discussed:    Additional Comments: CM spoke with patient at the bedside. States he does not know how much he pays for his medications. He states his daughter will know and will be here later.  Apolonio Schneiders, RN 07/23/2015, 1:29 PM

## 2015-07-23 NOTE — Progress Notes (Signed)
Hypoglycemic Event  CBG: 67  Treatment: 15 GM carbohydrate snack  Symptoms: None  Follow-up CBG: [id number] CBG Result:71  Possible Reasons for Event: Unknown  Comments/MD notified: Alinda Dooms H  Remember to initiate Hypoglycemia Order Set & complete

## 2015-07-23 NOTE — Progress Notes (Signed)
PROGRESS NOTE  Brian Owen [id number] DOB: May 24, 1951 DOA: 07/22/2015 PCP: [name], MD  HPI: Brian Owen is a 64 y.o. male found to be altered at home by family today. Poor PO intake over past couple of days. EMS called and found patients BGL to be 18. Treated and brought to ED. Improvement to BGL in ED of 140 has significantly improved mental status. Initially hypothermic to 92.7 his mental status continues to improve, T now up to 96.2 with bair hugger.  Subjective / 24 H Interval events - improving this morning, AxOx3, family bedside, patient appears happy without complaints eating breakfast   Assessment/Plan: Principal Problem:   Hypoglycemia Active Problems:   Diabetes mellitus type 2, uncontrolled   Hypothermia  Hypoglycemia  - resolving, patient now with CBGs upper 200s - discussed extensively with patient's daughter bedside this morning, she is administering his insulin, usually at breakfast and lunch ~ 3 pm. It is possible to have stacking effect on the NPH, discussed about administering closer to 10-12 hours apart.  - he is eating well, discontinue dextrose, add back NPH at a lower dose of 4 U BID  DM - poorly controlled, A1C ~11 a month ago  Hypothermia  - resolved, no apparent infection - check cortisol   Diet: Diet Carb Modified Fluid consistency:: Thin; Room service appropriate?: Yes Fluids: none DVT Prophylaxis: heparin  Code Status: Full Code Family Communication: d/w daughter and his wife bedside  Disposition Plan: transfer to floor  Consultants:  None   Procedures:  None    Antibiotics  Anti-infectives    Start     Dose/Rate Route Frequency Ordered Stop   07/22/15 1730  piperacillin-tazobactam (ZOSYN) IVPB 3.375 g     3.375 g 12.5 mL/hr over 240 Minutes Intravenous  Once 07/22/15 1727 07/22/15 1902   07/22/15 1730  vancomycin (VANCOCIN) IVPB 1000 mg/200 mL premix     1,000 mg 200 mL/hr over 60 Minutes Intravenous  Once  07/22/15 1727 07/22/15 1949       Studies  Ct Head Wo Contrast  07/22/2015   CLINICAL DATA:  Patient with altered mental status.  EXAM: CT HEAD WITHOUT CONTRAST  TECHNIQUE: Contiguous axial images were obtained from the base of the skull through the vertex without intravenous contrast.  COMPARISON:  Brain CT 05/10/2014  FINDINGS: Ventricles and sulci are appropriate for patient's age. No evidence for acute cortically based infarct, intracranial hemorrhage, mass lesion or mass-effect. Periventricular and subcortical white matter hypodensity compatible with chronic small vessel ischemic changes. Orbits are unremarkable. Mucosal thickening involving the left maxillary sinus and ethmoid air cells. Mastoid air cells are unremarkable. Calvarium is intact.  IMPRESSION: No acute intracranial process. Chronic small vessel ischemic changes.   Electronically Signed   By: Lovey Newcomer M.D.   On: 07/22/2015 17:45   Dg Chest Portable 1 View  07/22/2015   CLINICAL DATA:  Altered mental status  EXAM: PORTABLE CHEST 1 VIEW  COMPARISON:  05/06/2015  FINDINGS: The heart size and mediastinal contours are within normal limits. Both lungs are clear. The visualized skeletal structures are unremarkable.  IMPRESSION: No active disease.   Electronically Signed   By: Marybelle Killings M.D.   On: 07/22/2015 16:04    Objective  Filed Vitals:   07/23/15 0800 07/23/15 0900 07/23/15 1000 07/23/15 1200  BP: 135/62  118/74   Pulse: 88 88 102   Temp: 97.5 F (36.4 C)   98.7 F (37.1 C)  TempSrc: Oral   Oral  Resp: 19 15 18    Height:      Weight:      SpO2: 100% 99% 100%     Intake/Output Summary (Last 24 hours) at 07/23/15 1235 Last data filed at 07/23/15 0830  Gross per 24 hour  Intake 1536.25 ml  Output   1475 ml  Net  61.25 ml   Filed Weights   07/22/15 1511 07/22/15 2038 07/23/15 0525  Weight: 50.803 kg (112 lb) 50.4 kg (111 lb 1.8 oz) 50.7 kg (111 lb 12.4 oz)   Exam:  GENERAL: NAD  HEENT: head NCAT, no  scleral icterus.  NECK: Supple.   LUNGS: Clear to auscultation. No wheezing or crackles  HEART: Regular rate and rhythm without murmur. 2+ pulses  ABDOMEN: Soft, nontender, and nondistended.   EXTREMITIES: Without any cyanosis, clubbing, rash, lesions or edema.  NEUROLOGIC: Alert and oriented x3. Non focal    Data Reviewed: Basic Metabolic Panel:  Recent Labs Lab 07/22/15 1529 07/22/15 1542 07/23/15 0359  NA 140 141 141  K 4.0 3.9 3.6  CL 105 103 107  CO2 27  --  27  GLUCOSE 157* 153* 84  BUN 19 18 12   CREATININE 1.09 1.10 0.96  CALCIUM 9.5  --  9.1   Liver Function Tests:  Recent Labs Lab 07/22/15 1529  AST 21  ALT 18  ALKPHOS 110  BILITOT 0.4  PROT 7.6  ALBUMIN 4.1   CBC:  Recent Labs Lab 07/22/15 1539 07/22/15 1542  WBC 7.9  --   HGB 12.4* 13.3  HCT 36.9* 39.0  MCV 90.0  --   PLT 175  --    Cardiac Enzymes:  Recent Labs Lab 07/22/15 1529  TROPONINI <0.03   CBG:  Recent Labs Lab 07/23/15 0316 07/23/15 0336 07/23/15 0523 07/23/15 0817 07/23/15 1204  GLUCAP 67 71 73 73 235*    Recent Results (from the past 240 hour(s))  MRSA PCR Screening     Status: None   Collection Time: 07/22/15  8:30 PM  Result Value Ref Range Status   MRSA by PCR NEGATIVE NEGATIVE Final    Comment:        The GeneXpert MRSA Assay (FDA approved for NASAL specimens only), is one component of a comprehensive MRSA colonization surveillance program. It is not intended to diagnose MRSA infection nor to guide or monitor treatment for MRSA infections.      Scheduled Meds: . heparin  5,000 Units Subcutaneous 3 times per day  . insulin aspart  0-9 Units Subcutaneous 6 times per day  . insulin NPH Human  4 Units Subcutaneous BID AC & HS  . potassium chloride SA  20 mEq Oral Daily   Continuous Infusions: . dextrose 5 % and 0.9% NaCl 75 mL/hr at 07/22/15 1835    Time spent coordinating care: 25 minutes, greater than 50% of this time was spent in  counseling, explanation of diagnosis, and answering family questions at bedside.   Marzetta Board, MD [facility] Pager [phone]. If 7 PM - 7 AM, please contact night-coverage at [email or web address], password [facility] 07/23/2015, 12:35 PM

## 2015-07-23 NOTE — Progress Notes (Addendum)
Hypoglycemic Event  CBG: 41  Treatment: 15 GM carbohydrate snack  Symptoms: None  Follow-up CBG: Time: 1737 CBG Result: 155  Possible Reasons for Event: Inadequate meal intake  Comments/MD notified:  Patient had not had lunch and tray was delivered after CBG reading. Patient was transferred to 4th floor and RN notified of hypoglycemic event. Follow-up reading to be taken.    Epling, Ginger F  Remember to initiate Hypoglycemia Order Set & complete

## 2015-07-24 LAB — URINE CULTURE: CULTURE: NO GROWTH

## 2015-07-24 LAB — GLUCOSE, CAPILLARY
GLUCOSE-CAPILLARY: 142 mg/dL — AB (ref 65–99)
GLUCOSE-CAPILLARY: 168 mg/dL — AB (ref 65–99)
Glucose-Capillary: 73 mg/dL (ref 65–99)

## 2015-07-24 MED ORDER — INSULIN NPH ISOPHANE & REGULAR (70-30) 100 UNIT/ML ~~LOC~~ SUSP: 7.0000 [IU] | Freq: Two times a day (BID) | SUBCUTANEOUS | Status: DC

## 2015-07-24 NOTE — Discharge Instructions (Addendum)
Follow with [name], MD in 5-7 days  Please administer your insulin at least 10 hours apart to prevent further hypoglycemic episodes  Please get a complete blood count and chemistry panel checked by your Primary MD at your next visit, and again as instructed by your Primary MD. Please get your medications reviewed and adjusted by your Primary MD.  Please request your Primary MD to go over all Hospital Tests and Procedure/Radiological results at the follow up, please get all Hospital records sent to your Prim MD by signing hospital release before you go home.  If you had Pneumonia of Lung problems at the Hospital: Please get a 2 view Chest X ray done in 6-8 weeks after hospital discharge or sooner if instructed by your Primary MD.  If you have Congestive Heart Failure: Please call your Cardiologist or Primary MD anytime you have any of the following symptoms:  1) 3 pound weight gain in 24 hours or 5 pounds in 1 week  2) shortness of breath, with or without a dry hacking cough  3) swelling in the hands, feet or stomach  4) if you have to sleep on extra pillows at night in order to breathe  Follow cardiac low salt diet and 1.5 lit/day fluid restriction.  If you have diabetes Accuchecks 4 times/day, Once in AM empty stomach and then before each meal. Log in all results and show them to your primary doctor at your next visit. If any glucose reading is under 80 or above 300 call your primary MD immediately.  If you have Seizure/Convulsions/Epilepsy: Please do not drive, operate heavy machinery, participate in activities at heights or participate in high speed sports until you have seen by Primary MD or a Neurologist and advised to do so again.  If you had Gastrointestinal Bleeding: Please ask your Primary MD to check a complete blood count within one week of discharge or at your next visit. Your endoscopic/colonoscopic biopsies that are pending at the time of discharge, will also  need to followed by your Primary MD.  Get Medicines reviewed and adjusted. Please take all your medications with you for your next visit with your Primary MD  Please request your Primary MD to go over all hospital tests and procedure/radiological results at the follow up, please ask your Primary MD to get all Hospital records sent to his/her office.  If you experience worsening of your admission symptoms, develop shortness of breath, life threatening emergency, suicidal or homicidal thoughts you must seek medical attention immediately by calling 911 or calling your MD immediately  if symptoms less severe.  You must read complete instructions/literature along with all the possible adverse reactions/side effects for all the Medicines you take and that have been prescribed to you. Take any new Medicines after you have completely understood and accpet all the possible adverse reactions/side effects.   Do not drive or operate heavy machinery when taking Pain medications.   Do not take more than prescribed Pain, Sleep and Anxiety Medications  Special Instructions: If you have smoked or chewed Tobacco  in the last 2 yrs please stop smoking, stop any regular Alcohol  and or any Recreational drug use.  Wear Seat belts while driving.  Please note You were cared for by a hospitalist during your hospital stay. If you have any questions about your discharge medications or the care you received while you were in the hospital after you are discharged, you can call the unit and asked to speak with  the hospitalist on call if the hospitalist that took care of you is not available. Once you are discharged, your primary care physician will handle any further medical issues. Please note that NO REFILLS for any discharge medications will be authorized once you are discharged, as it is imperative that you return to your primary care physician (or establish a relationship with a primary care physician if you do not have  one) for your aftercare needs so that they can reassess your need for medications and monitor your lab values.  You can reach the hospitalist office at phone [phone] or fax [phone]   If you do not have a primary care physician, you can call [phone] for a physician referral.  Activity: As tolerated with Full fall precautions use walker/cane & assistance as needed  Diet: diabetic  Disposition Home

## 2015-07-24 NOTE — Discharge Summary (Signed)
Physician Discharge Summary  Brian Owen [id number] DOB: 08/13/1951 DOA: 07/22/2015  PCP: [name], MD  Admit date: 07/22/2015 Discharge date: 07/24/2015  Time spent: < 30 minutes  Recommendations for Outpatient Follow-up:  1. Follow up with Dr. Adrian Blackwater in 1 day   Discharge Diagnoses:  Principal Problem:   Hypoglycemia Active Problems:   Diabetes mellitus type 2, uncontrolled   Hypothermia  Discharge Condition: stable  Diet recommendation: diabetic  Filed Weights   07/22/15 1511 07/22/15 2038 07/23/15 0525  Weight: 50.803 kg (112 lb) 50.4 kg (111 lb 1.8 oz) 50.7 kg (111 lb 12.4 oz)   History of present illness:  Brian Owen is a 64 y.o. male found to be altered at home by family today. Poor PO intake over past couple of days. EMS called and found patients BGL to be 18. Treated and brought to ED. Improvement to BGL in ED of 140 has significantly improved mental status. Initially hypothermic to 92.7 his mental status continues to improve, T now up to 96.2 with bair hugger.  Hospital Course:  Patient was admitted to the hospital with hypoglycemia and hypothermia. He was initially in the stepdown, with a bear hugger as well as a dextrose infusion for his hypoglycemia. His CBGs slowly improved, patient was transferred to the floor on 9/24, his home insulin regimen was restarted at a lower dose and he has remained stable. His temperature also has improved with improvement in his sugars, and was no longer hypothermic. There was a concern for sepsis initially in the emergency room due to hypothermia, blood cultures were obtained, however this is unlikely. Patient was not started on antibiotics and admission due to low suspicion for infectious process, has remained stable, his cultures have remained negative and are still pending at the time of discharge. In discussing with the family, his daughter seems to manage his insulin, and I'm concern abated about stacking effect because  she administers his insulin in the morning with breakfast and in the afternoon around 2-3 PM. I discussed with her extensively, to leave at least 9-10 hours apart between the insulin dosage. His insulin regimen was decreased on discharge, she was advised to check his CBGs before each meal tonight and tomorrow morning as they have an appointment with his PCP tomorrow. I suspect he will need further titration of his insulin regimen as an outpatient.  Procedures:  None    Consultations:  None   Discharge Exam: Filed Vitals:   07/23/15 1200 07/23/15 1600 07/23/15 2027 07/24/15 0435  BP:   116/61 119/63  Pulse:   95 99  Temp: 98.7 F (37.1 C) 98.5 F (36.9 C) 98.3 F (36.8 C) 97.9 F (36.6 C)  TempSrc: Oral Oral Oral Oral  Resp:   18 18  Height:      Weight:      SpO2:   100% 91%    General: NAD Cardiovascular: RRR Respiratory: CTA biL  Discharge Instructions     Medication List    TAKE these medications        ACCU-CHEK FASTCLIX LANCETS Misc  Check BS 4 times daily before meals and at bedtime     acetaminophen 500 MG tablet  Commonly known as:  TYLENOL  Take 1,000 mg by mouth every 6 (six) hours as needed for mild pain or headache.     glucose blood test strip  Commonly known as:  ACCU-CHEK SMARTVIEW  Use as instructed     hydrocortisone cream 0.5 %  Apply 1  application topically 2 (two) times daily.     insulin NPH-regular Human (70-30) 100 UNIT/ML injection  Commonly known as:  NOVOLIN 70/30  Inject 7 Units into the skin 2 (two) times daily with a meal.     Insulin Syringes (Disposable) U-100 1 ML Misc  10 Units by Does not apply route 2 (two) times daily.     potassium chloride SA 20 MEQ tablet  Commonly known as:  K-DUR,KLOR-CON  Take 1 tablet (20 mEq total) by mouth daily.           Follow-up Information    Follow up with [name], MD. Schedule an appointment as soon as possible for a visit in 1 week.   Specialty:  Family Medicine    Contact information:   [place]  [phone]       The results of significant diagnostics from this hospitalization (including imaging, microbiology, ancillary and laboratory) are listed below for reference.    Significant Diagnostic Studies: Ct Head Wo Contrast  07/22/2015   CLINICAL DATA:  Patient with altered mental status.  EXAM: CT HEAD WITHOUT CONTRAST  TECHNIQUE: Contiguous axial images were obtained from the base of the skull through the vertex without intravenous contrast.  COMPARISON:  Brain CT 05/10/2014  FINDINGS: Ventricles and sulci are appropriate for patient's age. No evidence for acute cortically based infarct, intracranial hemorrhage, mass lesion or mass-effect. Periventricular and subcortical white matter hypodensity compatible with chronic small vessel ischemic changes. Orbits are unremarkable. Mucosal thickening involving the left maxillary sinus and ethmoid air cells. Mastoid air cells are unremarkable. Calvarium is intact.  IMPRESSION: No acute intracranial process. Chronic small vessel ischemic changes.   Electronically Signed   By: Lovey Newcomer M.D.   On: 07/22/2015 17:45   Dg Chest Portable 1 View  07/22/2015   CLINICAL DATA:  Altered mental status  EXAM: PORTABLE CHEST 1 VIEW  COMPARISON:  05/06/2015  FINDINGS: The heart size and mediastinal contours are within normal limits. Both lungs are clear. The visualized skeletal structures are unremarkable.  IMPRESSION: No active disease.   Electronically Signed   By: Marybelle Killings M.D.   On: 07/22/2015 16:04    Microbiology: Recent Results (from the past 240 hour(s))  Blood culture (routine x 2)     Status: None (Preliminary result)   Collection Time: 07/22/15  3:29 PM  Result Value Ref Range Status   Specimen Description BLOOD RIGHT ANTECUBITAL  Final   Special Requests BOTTLES DRAWN AEROBIC AND ANAEROBIC 5CC  Final   Culture   Final    NO GROWTH 2 DAYS Performed at [facility]     Report Status PENDING  Incomplete  Blood culture (routine x 2)     Status: None (Preliminary result)   Collection Time: 07/22/15  3:29 PM  Result Value Ref Range Status   Specimen Description BLOOD RIGHT WRIST  Final   Special Requests BOTTLES DRAWN AEROBIC AND ANAEROBIC 5CC  Final   Culture   Final    NO GROWTH 2 DAYS Performed at [facility]    Report Status PENDING  Incomplete  Urine culture     Status: None   Collection Time: 07/22/15  7:41 PM  Result Value Ref Range Status   Specimen Description URINE, CATHETERIZED  Final   Special Requests NONE  Final   Culture   Final    NO GROWTH 1 DAY Performed at [facility]    Report Status 07/24/2015  FINAL  Final  MRSA PCR Screening     Status: None   Collection Time: 07/22/15  8:30 PM  Result Value Ref Range Status   MRSA by PCR NEGATIVE NEGATIVE Final    Comment:        The GeneXpert MRSA Assay (FDA approved for NASAL specimens only), is one component of a comprehensive MRSA colonization surveillance program. It is not intended to diagnose MRSA infection nor to guide or monitor treatment for MRSA infections.      Labs: Basic Metabolic Panel:  Recent Labs Lab 07/22/15 1529 07/22/15 1542 07/23/15 0359  NA 140 141 141  K 4.0 3.9 3.6  CL 105 103 107  CO2 27  --  27  GLUCOSE 157* 153* 84  BUN 19 18 12   CREATININE 1.09 1.10 0.96  CALCIUM 9.5  --  9.1   Liver Function Tests:  Recent Labs Lab 07/22/15 1529  AST 21  ALT 18  ALKPHOS 110  BILITOT 0.4  PROT 7.6  ALBUMIN 4.1   CBC:  Recent Labs Lab 07/22/15 1539 07/22/15 1542  WBC 7.9  --   HGB 12.4* 13.3  HCT 36.9* 39.0  MCV 90.0  --   PLT 175  --    Cardiac Enzymes:  Recent Labs Lab 07/22/15 1529  TROPONINI <0.03   CBG:  Recent Labs Lab 07/23/15 1739 07/23/15 1934 07/23/15 2358 07/24/15 0416 07/24/15 0854  GLUCAP 155* 146* 142* 73 168*       Signed:  GHERGHE, COSTIN  [facility] 07/24/2015, 4:01 PM

## 2015-07-24 NOTE — Clinical Documentation Improvement (Signed)
Internal Medicine  Noted in ED MD documentation  the diagnosis of "Sepsis" be further specified? Thank you   Ruled in Sepsis  Ruled out Sepsis  Other  Clinically Undetermine     Please exercise your independent, professional judgment when responding. A specific answer is not anticipated or expected.   Thank You,  [place] [phone]

## 2015-07-24 NOTE — Progress Notes (Signed)
Patient discharged home with daughter, discharge instructions given and explained to patient/daughter and they verbalized understanding, denies any pain/distress. Accompanied home by daughter, transported to the car by staff via wheelchair.

## 2015-07-25 ENCOUNTER — Encounter: Payer: Self-pay | Admitting: Family Medicine

## 2015-07-25 ENCOUNTER — Ambulatory Visit: Payer: Medicare Other | Attending: Family Medicine | Admitting: Family Medicine

## 2015-07-25 VITALS — BP 146/84 | HR 110 | Temp 98.9°F | Resp 16 | Wt 114.0 lb

## 2015-07-25 DIAGNOSIS — Z87891 Personal history of nicotine dependence: Secondary | ICD-10-CM | POA: Diagnosis not present

## 2015-07-25 DIAGNOSIS — E1165 Type 2 diabetes mellitus with hyperglycemia: Secondary | ICD-10-CM | POA: Insufficient documentation

## 2015-07-25 DIAGNOSIS — Z79899 Other long term (current) drug therapy: Secondary | ICD-10-CM | POA: Insufficient documentation

## 2015-07-25 DIAGNOSIS — E119 Type 2 diabetes mellitus without complications: Secondary | ICD-10-CM | POA: Diagnosis not present

## 2015-07-25 DIAGNOSIS — I10 Essential (primary) hypertension: Secondary | ICD-10-CM | POA: Insufficient documentation

## 2015-07-25 DIAGNOSIS — Z794 Long term (current) use of insulin: Secondary | ICD-10-CM | POA: Insufficient documentation

## 2015-07-25 DIAGNOSIS — IMO0002 Reserved for concepts with insufficient information to code with codable children: Secondary | ICD-10-CM

## 2015-07-25 LAB — GLUCOSE, POCT (MANUAL RESULT ENTRY): POC Glucose: 158 mg/dl — AB (ref 70–99)

## 2015-07-25 MED ORDER — LISINOPRIL 10 MG PO TABS: 10.0000 mg | ORAL_TABLET | Freq: Every day | ORAL | Status: DC

## 2015-07-25 NOTE — Progress Notes (Signed)
Patient ID: Brian Owen, male   DOB: 1950/11/23, 64 y.o.   MRN: [id number]   Subjective:  Patient ID: Brian Owen, male    DOB: 1951/07/08  Age: 64 y.o. MRN: [id number]  CC: Diabetes and Hypertension  HPI Brian Owen presents for DM 2   CHRONIC DIABETES  Disease Monitoring  Blood Sugar Ranges: patient brings in a log from the week of 8/26 and 9/2  52-402, most recent range 177-300.   Polyuria: no   Visual problems: yes, blurry vision    Medication Compliance: yes  Medication Side Effects  Hypoglycemia: yes, patient with labile CBGs treated recently in hospital for CBG of [place] Exam: due   Foot Exam: done today     2. HTN: no antihypertensive. Occasionally hypotensive in setting of diarrhea. No diarrhea recently. No HA, CP or SOB. Blurry vision.    Social History  Substance Use Topics  . Smoking status: Former Smoker -- 40 years    Types: Cigarettes    Quit date: 04/28/2014  . Smokeless tobacco: Former Systems developer    Quit date: 03/29/2014  . Alcohol Use: No   Outpatient Prescriptions Prior to Visit  Medication Sig Dispense Refill  . ACCU-CHEK FASTCLIX LANCETS MISC Check BS 4 times daily before meals and at bedtime 102 each 11  . acetaminophen (TYLENOL) 500 MG tablet Take 1,000 mg by mouth every 6 (six) hours as needed for mild pain or headache.    . glucose blood (ACCU-CHEK SMARTVIEW) test strip Use as instructed 100 each 12  . hydrocortisone cream 0.5 % Apply 1 application topically 2 (two) times daily. 30 g 0  . insulin NPH-regular Human (NOVOLIN 70/30) (70-30) 100 UNIT/ML injection Inject 7 Units into the skin 2 (two) times daily with a meal. 10 mL 2  . Insulin Syringes, Disposable, U-100 1 ML MISC 10 Units by Does not apply route 2 (two) times daily. 100 each 3  . potassium chloride SA (K-DUR,KLOR-CON) 20 MEQ tablet Take 1 tablet (20 mEq total) by mouth daily. 30 tablet 3   No facility-administered medications prior to visit.    ROS Review of  Systems  Constitutional: Negative for fever, chills, fatigue and unexpected weight change.  Eyes: Negative for visual disturbance.  Respiratory: Negative for cough and shortness of breath.   Cardiovascular: Negative for chest pain, palpitations and leg swelling.  Gastrointestinal: Negative for nausea, vomiting, abdominal pain, diarrhea, constipation and blood in stool.  Endocrine: Negative for polydipsia, polyphagia and polyuria.  Musculoskeletal: Negative for myalgias, back pain, arthralgias, gait problem and neck pain.  Skin: Negative for rash.  Allergic/Immunologic: Negative for immunocompromised state.  Hematological: Negative for adenopathy. Does not bruise/bleed easily.  Psychiatric/Behavioral: Negative for suicidal ideas, sleep disturbance and dysphoric mood. The patient is not nervous/anxious.     Objective:  BP 146/84 mmHg  Pulse 110  Temp(Src) 98.9 F (37.2 C) (Oral)  Resp 16  Wt 114 lb (51.71 kg)  SpO2 99%  BP/Weight 07/25/2015 07/24/2015 [id number]  Systolic BP 123456 123456 -  Diastolic BP 84 63 -  Wt. (Lbs) 114 - 111.77  BMI 16.36 - 16.04   Physical Exam  Constitutional: He appears well-developed and well-nourished. No distress.  HENT:  Head: Normocephalic and atraumatic.  Neck: Normal range of motion. Neck supple.  Cardiovascular: Normal rate, regular rhythm, normal heart sounds and intact distal pulses.   Pulmonary/Chest: Effort normal and breath sounds normal.  Musculoskeletal: He exhibits no edema.  Neurological: He is  alert.  Skin: Skin is warm and dry. No rash noted. No erythema.  Psychiatric: He has a normal mood and affect.    Lab Results  Component Value Date   HGBA1C 11.60 06/23/2015  CBG 158 Assessment & Plan:   Problem List Items Addressed This Visit    Diabetes mellitus type 2, uncontrolled - Primary (Chronic)   Relevant Medications   lisinopril (PRINIVIL,ZESTRIL) 10 MG tablet   Other Relevant Orders   POCT glucose (manual entry) (Completed)    Microalbumin/Creatinine Ratio, Urine   Ambulatory referral to Ophthalmology   HTN (hypertension)   Relevant Medications   lisinopril (PRINIVIL,ZESTRIL) 10 MG tablet      No orders of the defined types were placed in this encounter.    Follow-up: No Follow-up on file.   Boykin Nearing MD

## 2015-07-25 NOTE — Patient Instructions (Addendum)
Brian Owen, Brian Owen was seen today for diabetes, hypertension and hospitalization follow-up.  Diagnoses and all orders for this visit:  Diabetes mellitus type 2, uncontrolled -     POCT glucose (manual entry) -     Microalbumin/Creatinine Ratio, Urine -     Ambulatory referral to Ophthalmology -     lisinopril (PRINIVIL,ZESTRIL) 10 MG tablet; Take 1 tablet (10 mg total) by mouth daily.  Essential hypertension -     lisinopril (PRINIVIL,ZESTRIL) 10 MG tablet; Take 1 tablet (10 mg total) by mouth daily.    F/u in 6 weeks with RN for CBG review and BMP F/u with me in 3 months for diabetes and HTN   Dr. Adrian Blackwater

## 2015-07-25 NOTE — Progress Notes (Signed)
Used pacific interpreted [place] [phone] HFU Dizziness  Hx DM HTN Last Insulin taken on Friday 10 unit

## 2015-07-26 LAB — MICROALBUMIN / CREATININE URINE RATIO
CREATININE, URINE: 70.2 mg/dL
MICROALB/CREAT RATIO: 17.1 mg/g (ref 0.0–30.0)
Microalb, Ur: 1.2 mg/dL (ref <2.0)

## 2015-07-27 LAB — CULTURE, BLOOD (ROUTINE X 2)
Culture: NO GROWTH
Culture: NO GROWTH

## 2015-11-07 DIAGNOSIS — Y939 Activity, unspecified: Secondary | ICD-10-CM | POA: Diagnosis not present

## 2015-11-07 DIAGNOSIS — N3000 Acute cystitis without hematuria: Secondary | ICD-10-CM | POA: Diagnosis not present

## 2015-11-07 DIAGNOSIS — M25522 Pain in left elbow: Secondary | ICD-10-CM | POA: Diagnosis not present

## 2015-11-07 DIAGNOSIS — F17200 Nicotine dependence, unspecified, uncomplicated: Secondary | ICD-10-CM | POA: Diagnosis not present

## 2015-11-07 DIAGNOSIS — Z0389 Encounter for observation for other suspected diseases and conditions ruled out: Secondary | ICD-10-CM | POA: Diagnosis not present

## 2015-11-07 DIAGNOSIS — Y929 Unspecified place or not applicable: Secondary | ICD-10-CM | POA: Diagnosis not present

## 2015-11-07 DIAGNOSIS — Z9114 Patient's other noncompliance with medication regimen: Secondary | ICD-10-CM | POA: Diagnosis not present

## 2015-11-07 DIAGNOSIS — W19XXXA Unspecified fall, initial encounter: Secondary | ICD-10-CM | POA: Diagnosis not present

## 2015-11-07 DIAGNOSIS — E1165 Type 2 diabetes mellitus with hyperglycemia: Secondary | ICD-10-CM | POA: Diagnosis not present

## 2015-11-07 DIAGNOSIS — I10 Essential (primary) hypertension: Secondary | ICD-10-CM | POA: Diagnosis not present

## 2015-11-11 ENCOUNTER — Observation Stay (HOSPITAL_COMMUNITY)
Admission: EM | Admit: 2015-11-11 | Discharge: 2015-11-13 | Disposition: A | Discharge status: Home / Self Care | Payer: Medicare Other | Attending: Internal Medicine | Admitting: Internal Medicine

## 2015-11-11 ENCOUNTER — Encounter (HOSPITAL_COMMUNITY): Payer: Self-pay

## 2015-11-11 ENCOUNTER — Emergency Department (HOSPITAL_COMMUNITY): Payer: Medicare Other

## 2015-11-11 DIAGNOSIS — E860 Dehydration: Secondary | ICD-10-CM | POA: Diagnosis not present

## 2015-11-11 DIAGNOSIS — I471 Supraventricular tachycardia, unspecified: Secondary | ICD-10-CM

## 2015-11-11 DIAGNOSIS — R238 Other skin changes: Secondary | ICD-10-CM | POA: Diagnosis not present

## 2015-11-11 DIAGNOSIS — E869 Volume depletion, unspecified: Secondary | ICD-10-CM | POA: Diagnosis not present

## 2015-11-11 DIAGNOSIS — R262 Difficulty in walking, not elsewhere classified: Secondary | ICD-10-CM | POA: Diagnosis not present

## 2015-11-11 DIAGNOSIS — Z87891 Personal history of nicotine dependence: Secondary | ICD-10-CM | POA: Diagnosis not present

## 2015-11-11 DIAGNOSIS — E1165 Type 2 diabetes mellitus with hyperglycemia: Principal | ICD-10-CM | POA: Insufficient documentation

## 2015-11-11 DIAGNOSIS — Z681 Body mass index (BMI) 19 or less, adult: Secondary | ICD-10-CM | POA: Insufficient documentation

## 2015-11-11 DIAGNOSIS — Z79899 Other long term (current) drug therapy: Secondary | ICD-10-CM | POA: Insufficient documentation

## 2015-11-11 DIAGNOSIS — E118 Type 2 diabetes mellitus with unspecified complications: Secondary | ICD-10-CM | POA: Diagnosis not present

## 2015-11-11 DIAGNOSIS — E878 Other disorders of electrolyte and fluid balance, not elsewhere classified: Secondary | ICD-10-CM | POA: Diagnosis not present

## 2015-11-11 DIAGNOSIS — I10 Essential (primary) hypertension: Secondary | ICD-10-CM | POA: Insufficient documentation

## 2015-11-11 DIAGNOSIS — E43 Unspecified severe protein-calorie malnutrition: Secondary | ICD-10-CM | POA: Diagnosis not present

## 2015-11-11 DIAGNOSIS — Z9114 Patient's other noncompliance with medication regimen: Secondary | ICD-10-CM | POA: Diagnosis not present

## 2015-11-11 DIAGNOSIS — R739 Hyperglycemia, unspecified: Secondary | ICD-10-CM | POA: Diagnosis not present

## 2015-11-11 DIAGNOSIS — K529 Noninfective gastroenteritis and colitis, unspecified: Secondary | ICD-10-CM | POA: Diagnosis not present

## 2015-11-11 DIAGNOSIS — Z794 Long term (current) use of insulin: Secondary | ICD-10-CM | POA: Insufficient documentation

## 2015-11-11 DIAGNOSIS — R531 Weakness: Secondary | ICD-10-CM | POA: Insufficient documentation

## 2015-11-11 DIAGNOSIS — R5381 Other malaise: Secondary | ICD-10-CM | POA: Diagnosis not present

## 2015-11-11 DIAGNOSIS — E876 Hypokalemia: Secondary | ICD-10-CM | POA: Diagnosis not present

## 2015-11-11 DIAGNOSIS — N179 Acute kidney failure, unspecified: Secondary | ICD-10-CM | POA: Insufficient documentation

## 2015-11-11 DIAGNOSIS — L988 Other specified disorders of the skin and subcutaneous tissue: Secondary | ICD-10-CM

## 2015-11-11 DIAGNOSIS — R197 Diarrhea, unspecified: Secondary | ICD-10-CM | POA: Diagnosis present

## 2015-11-11 HISTORY — DX: Type 2 diabetes mellitus with hyperglycemia: E11.65

## 2015-11-11 HISTORY — DX: Other skin changes: R23.8

## 2015-11-11 HISTORY — DX: Reserved for concepts with insufficient information to code with codable children: IMO0002

## 2015-11-11 LAB — GLUCOSE, CAPILLARY
GLUCOSE-CAPILLARY: 413 mg/dL — AB (ref 65–99)
Glucose-Capillary: 405 mg/dL — ABNORMAL HIGH (ref 65–99)

## 2015-11-11 LAB — CBC WITH DIFFERENTIAL/PLATELET
BASOS ABS: 0 10*3/uL (ref 0.0–0.1)
Basophils Relative: 0 %
Eosinophils Absolute: 0 10*3/uL (ref 0.0–0.7)
Eosinophils Relative: 0 %
HCT: 36.9 % — ABNORMAL LOW (ref 39.0–52.0)
HEMOGLOBIN: 12.9 g/dL — AB (ref 13.0–17.0)
LYMPHS ABS: 1 10*3/uL (ref 0.7–4.0)
LYMPHS PCT: 6 %
MCH: 30.4 pg (ref 26.0–34.0)
MCHC: 35 g/dL (ref 30.0–36.0)
MCV: 86.8 fL (ref 78.0–100.0)
Monocytes Absolute: 1.3 10*3/uL — ABNORMAL HIGH (ref 0.1–1.0)
Monocytes Relative: 8 %
Neutro Abs: 12.9 10*3/uL — ABNORMAL HIGH (ref 1.7–7.7)
Neutrophils Relative %: 86 %
PLATELETS: 168 10*3/uL (ref 150–400)
RBC: 4.25 MIL/uL (ref 4.22–5.81)
RDW: 12.8 % (ref 11.5–15.5)
WBC: 15.2 10*3/uL — AB (ref 4.0–10.5)

## 2015-11-11 LAB — CBG MONITORING, ED
GLUCOSE-CAPILLARY: 337 mg/dL — AB (ref 65–99)
GLUCOSE-CAPILLARY: 282 mg/dL — AB (ref 65–99)

## 2015-11-11 LAB — I-STAT CHEM 8, ED
BUN: 32 mg/dL — AB (ref 6–20)
CHLORIDE: 94 mmol/L — AB (ref 101–111)
CREATININE: 1.3 mg/dL — AB (ref 0.61–1.24)
Calcium, Ion: 1.14 mmol/L (ref 1.13–1.30)
GLUCOSE: 369 mg/dL — AB (ref 65–99)
HCT: 43 % (ref 39.0–52.0)
Hemoglobin: 14.6 g/dL (ref 13.0–17.0)
POTASSIUM: 3.4 mmol/L — AB (ref 3.5–5.1)
Sodium: 132 mmol/L — ABNORMAL LOW (ref 135–145)
TCO2: 22 mmol/L (ref 0–100)

## 2015-11-11 LAB — CBC
HEMATOCRIT: 37.6 % — AB (ref 39.0–52.0)
HEMOGLOBIN: 12.7 g/dL — AB (ref 13.0–17.0)
MCH: 30.2 pg (ref 26.0–34.0)
MCHC: 33.8 g/dL (ref 30.0–36.0)
MCV: 89.5 fL (ref 78.0–100.0)
Platelets: 165 10*3/uL (ref 150–400)
RBC: 4.2 MIL/uL — ABNORMAL LOW (ref 4.22–5.81)
RDW: 13.1 % (ref 11.5–15.5)
WBC: 15.8 10*3/uL — ABNORMAL HIGH (ref 4.0–10.5)

## 2015-11-11 LAB — URINALYSIS, ROUTINE W REFLEX MICROSCOPIC
Bilirubin Urine: NEGATIVE
Glucose, UA: >1000 mg/dL — AB
Ketones, ur: 40 mg/dL — AB
Leukocytes, UA: NEGATIVE
Nitrite: NEGATIVE
PH: 5.5 (ref 5.0–8.0)
Protein, ur: 30 mg/dL — AB
SPECIFIC GRAVITY, URINE: 1.016 (ref 1.005–1.030)

## 2015-11-11 LAB — BASIC METABOLIC PANEL
Anion gap: 16 — ABNORMAL HIGH (ref 5–15)
BUN: 22 mg/dL — AB (ref 6–20)
CHLORIDE: 104 mmol/L (ref 101–111)
CO2: 17 mmol/L — ABNORMAL LOW (ref 22–32)
Calcium: 8.9 mg/dL (ref 8.9–10.3)
Creatinine, Ser: 1.54 mg/dL — ABNORMAL HIGH (ref 0.61–1.24)
GFR calc Af Amer: 53 mL/min — ABNORMAL LOW (ref >60)
GFR calc non Af Amer: 46 mL/min — ABNORMAL LOW (ref >60)
GLUCOSE: 390 mg/dL — AB (ref 65–99)
POTASSIUM: 3.4 mmol/L — AB (ref 3.5–5.1)
Sodium: 137 mmol/L (ref 135–145)

## 2015-11-11 LAB — URINE MICROSCOPIC-ADD ON

## 2015-11-11 LAB — MAGNESIUM: Magnesium: 1.9 mg/dL (ref 1.7–2.4)

## 2015-11-11 LAB — CK: CK TOTAL: 119 U/L (ref 49–397)

## 2015-11-11 MED ORDER — ENSURE ENLIVE PO LIQD
237.0000 mL | Freq: Two times a day (BID) | ORAL | Status: DC
  Administered 2015-11-12 – 2015-11-13 (×4): 237 mL via ORAL

## 2015-11-11 MED ORDER — SODIUM CHLORIDE 0.9 % IV SOLN: INTRAVENOUS | Status: DC

## 2015-11-11 MED ORDER — METOPROLOL TARTRATE 12.5 MG HALF TABLET
12.5000 mg | ORAL_TABLET | Freq: Two times a day (BID) | ORAL | Status: DC
  Administered 2015-11-11 – 2015-11-12 (×2): 12.5 mg via ORAL
  Filled 2015-11-11 (×2): qty 1

## 2015-11-11 MED ORDER — INSULIN ASPART 100 UNIT/ML ~~LOC~~ SOLN
3.0000 [IU] | Freq: Once | SUBCUTANEOUS | Status: AC
  Administered 2015-11-11: 3 [IU] via SUBCUTANEOUS
  Filled 2015-11-11: qty 1

## 2015-11-11 MED ORDER — METFORMIN HCL 500 MG PO TABS
500.0000 mg | ORAL_TABLET | Freq: Once | ORAL | Status: AC
  Administered 2015-11-11: 500 mg via ORAL
  Filled 2015-11-11: qty 1

## 2015-11-11 MED ORDER — METOPROLOL TARTRATE 1 MG/ML IV SOLN
5.0000 mg | Freq: Once | INTRAVENOUS | Status: AC
  Administered 2015-11-11: 5 mg via INTRAVENOUS

## 2015-11-11 MED ORDER — SODIUM CHLORIDE 0.9 % IV BOLUS (SEPSIS)
1000.0000 mL | Freq: Once | INTRAVENOUS | Status: AC
  Administered 2015-11-11: 1000 mL via INTRAVENOUS

## 2015-11-11 MED ORDER — POTASSIUM CHLORIDE CRYS ER 20 MEQ PO TBCR: 40.0000 meq | EXTENDED_RELEASE_TABLET | Freq: Once | ORAL | Status: DC

## 2015-11-11 MED ORDER — POTASSIUM CHLORIDE IN NACL 20-0.9 MEQ/L-% IV SOLN
INTRAVENOUS | Status: DC
  Administered 2015-11-11 – 2015-11-12 (×2): via INTRAVENOUS
  Filled 2015-11-11 (×3): qty 1000

## 2015-11-11 MED ORDER — SODIUM CHLORIDE 0.9 % IV BOLUS (SEPSIS)
250.0000 mL | Freq: Once | INTRAVENOUS | Status: AC
  Administered 2015-11-11: 250 mL via INTRAVENOUS

## 2015-11-11 MED ORDER — SODIUM CHLORIDE 0.9 % IJ SOLN
3.0000 mL | Freq: Two times a day (BID) | INTRAMUSCULAR | Status: DC
  Administered 2015-11-12 – 2015-11-13 (×2): 3 mL via INTRAVENOUS

## 2015-11-11 MED ORDER — HEPARIN SODIUM (PORCINE) 5000 UNIT/ML IJ SOLN
5000.0000 [IU] | Freq: Three times a day (TID) | INTRAMUSCULAR | Status: DC
  Administered 2015-11-11 – 2015-11-13 (×5): 5000 [IU] via SUBCUTANEOUS
  Filled 2015-11-11 (×5): qty 1

## 2015-11-11 MED ORDER — INSULIN ASPART 100 UNIT/ML ~~LOC~~ SOLN: 0.0000 [IU] | Freq: Three times a day (TID) | SUBCUTANEOUS | Status: DC

## 2015-11-11 MED ORDER — POTASSIUM CHLORIDE CRYS ER 20 MEQ PO TBCR
40.0000 meq | EXTENDED_RELEASE_TABLET | Freq: Once | ORAL | Status: AC
  Administered 2015-11-11: 40 meq via ORAL
  Filled 2015-11-11: qty 2

## 2015-11-11 MED ORDER — METOPROLOL TARTRATE 1 MG/ML IV SOLN
INTRAVENOUS | Status: AC
  Administered 2015-11-11: 5 mg via INTRAVENOUS
  Filled 2015-11-11: qty 5

## 2015-11-11 MED ORDER — SODIUM CHLORIDE 0.9 % IV BOLUS (SEPSIS): 250.0000 mL | Freq: Once | INTRAVENOUS | Status: DC

## 2015-11-11 MED ORDER — INSULIN ASPART 100 UNIT/ML ~~LOC~~ SOLN
8.0000 [IU] | Freq: Once | SUBCUTANEOUS | Status: AC
  Administered 2015-11-11: 8 [IU] via SUBCUTANEOUS

## 2015-11-11 NOTE — Progress Notes (Signed)
Informed by admissions that patient was supposed to be picked up by Internal Medicine and was not. Informed her MD is needed at this time, states she is contacting [facility] to request an MD to see patient.

## 2015-11-11 NOTE — Plan of Care (Signed)
65 year old male with known history of diabetes mellitus who was recently admitted in the hospital at [place] was brought in by patient's family as patient was found to be increasingly weak. Patient was also found to have bullae on his legs. Patient is being admitted for dehydration and uncontrolled diabetes. Admission request called by Dr. Randal Buba. EDP.  Gean Birchwood.

## 2015-11-11 NOTE — ED Notes (Signed)
Pt from home by [facility]. Pt CBG for [facility] was 351. Pt denies any pain.

## 2015-11-11 NOTE — Progress Notes (Addendum)
Patient requesting to go to the bathroom, assisted to the bathroom, patient stumbling with poor balance. Once to the toilet, patient began having diarrhea while standing, family instructed him in [place] to turn around and sit on toilet and patient refused to turn around and sit on toilet. He began swaying and was lowered to the floor by this RN. Admission nurse, Dorthula Nettles, RN witness to incident. Charge nurse, Ginger, then arrived to the room and helped assist patient out of the floor and back into bed.   Attempted to notify Dr Hal Hope but line is an office number and does not provide after hours number. Will request MD information from admissions.

## 2015-11-11 NOTE — ED Notes (Signed)
Pt given water and a [place] sandwich.

## 2015-11-11 NOTE — H&P (Signed)
Patient Demographics  Brian Owen, is a 65 y.o. male  MRN: [id number]   DOB - 1950/12/30  Admit Date - 11/11/2015  Outpatient Primary MD for the patient is [name], MD   With History of -  Past Medical History  Diagnosis Date  . Heavy cigarette smoker   . Bullae 11/11/2015    legs/notes 11/11/2015  . Uncontrolled type II diabetes mellitus ([place]) dx'd 2013      Past Surgical History  Procedure Laterality Date  . No past surgeries      in for   Chief Complaint  Patient presents with  . Hyperglycemia     HPI  Brian Owen  is a 65 y.o. male, with past medical history of hypertension, diabetes mellitus, medication noncompliance, a shunt was visiting his daughter in [place], recently came back, agent is [place] speaking, daughter at bedside translating, she brought him over for weakness, and diarrhea, reports area been going on for years, ports he has out of his insulin for a few months now, as well developed some bullae in left thigh over few weeks, the ports he has been losing weight, with poor appetite, as was negative, chest  x-ray with no acute cardiopulmonary disease,  Workup was significant for mild acute renal failure, clinical dehydration, and hyperglycemia, patient was noticed to have tachycardia on the floor, EKG showing SVT, inverted back to normal sinus rhythm after 5 mg of IV metoprolol push.    Review of Systems    In addition to the HPI above,  No Fever-chills, No Headache, No changes with Vision or hearing, No problems swallowing food or Liquids, No Chest pain, Cough or Shortness of Breath, No Abdominal pain, No Nausea or Vommitting,  ports chronic diarrhea No Blood in stool or Urine, No dysuria,  agent with bullae in left upper thigh. No new joints pains-aches,  No new weakness, tingling, numbness in any extremity, No recent weight gain or loss, No polyuria, polydypsia or polyphagia, No significant Mental Stressors.  A full 10 point Review  of Systems was done, except as stated above, all other Review of Systems were negative.   Social History Social History  Substance Use Topics  . Smoking status: Former Smoker -- 0.25 packs/day for 48 years    Types: Cigarettes    Quit date: 04/28/2014  . Smokeless tobacco: Never Used  . Alcohol Use: No     Family History Significant for  diabetes mellitus  In family.    Prior to Admission medications   Medication Sig Start Date End Date Taking? Authorizing Provider  ACCU-CHEK FASTCLIX LANCETS MISC Check BS 4 times daily before meals and at bedtime Patient not taking: Reported on 11/11/2015 07/01/15   Brayton Caves, PA-C  acetaminophen (TYLENOL) 500 MG tablet Take 1,000 mg by mouth every 6 (six) hours as needed for mild pain or headache.    Historical Provider, MD  glucose blood (ACCU-CHEK SMARTVIEW) test strip Use as instructed Patient not taking: Reported on 11/11/2015 07/01/15   Brayton Caves, PA-C  insulin NPH-regular Human (NOVOLIN 70/30) (70-30) 100 UNIT/ML injection Inject 7 Units into the skin 2 (two) times daily with a meal. Patient not taking: Reported on 11/11/2015 07/24/15   Caren Griffins, MD  Insulin Syringes, Disposable, U-100 1 ML MISC 10 Units by Does not apply route 2 (two) times daily. Patient not taking: Reported on 07/25/2015 06/23/15   Brayton Caves, PA-C  lisinopril (PRINIVIL,ZESTRIL) 10 MG tablet Take 1 tablet (  10 mg total) by mouth daily. Patient not taking: Reported on 11/11/2015 07/25/15   Boykin Nearing, MD  potassium chloride SA (K-DUR,KLOR-CON) 20 MEQ tablet Take 1 tablet (20 mEq total) by mouth daily. Patient not taking: Reported on 11/11/2015 06/29/15   Brayton Caves, PA-C    No Known Allergies  Physical Exam  Vitals  Blood pressure 95/49, pulse 76, temperature 97.9 F (36.6 C), temperature source Oral, resp. rate 18, SpO2 100 %.   1. General  malnourished, elderly male, lying in bed in NAD,    2. Normal affect and insight,  Awake Alert,  Oriented X 3.  3. No F.N deficits, ALL C.Nerves Intact, Strength 5/5 all 4 extremities, Sensation intact all 4 extremities, Plantars down going.  4. Ears and Eyes appear Normal, Conjunctivae clear, PERRLA. Moist Oral Mucosa.  5. Supple Neck, No JVD, No cervical lymphadenopathy appriciated, No Carotid Bruits.  6. Symmetrical Chest wall movement, Good air movement bilaterally, CTAB.  7. RRR, No Gallops, Rubs or Murmurs, No Parasternal Heave.  8. Positive Bowel Sounds, Abdomen Soft, No tenderness, No organomegaly appriciated,No rebound -guarding or rigidity.  9.  No Cyanosis, Normal Skin Turgor,  Patient with an area of pealed skin in left upper thigh  10. significant Muscle wasting ,  joints appear normal , no effusions, Normal ROM.    Data Review  CBC  Recent Labs Lab 11/11/15 0406 11/11/15 0409  WBC 15.2*  --   HGB 12.9* 14.6  HCT 36.9* 43.0  PLT 168  --   MCV 86.8  --   MCH 30.4  --   MCHC 35.0  --   RDW 12.8  --   LYMPHSABS 1.0  --   MONOABS 1.3*  --   EOSABS 0.0  --   BASOSABS 0.0  --    ------------------------------------------------------------------------------------------------------------------  Chemistries   Recent Labs Lab 11/11/15 0409  NA 132*  K 3.4*  CL 94*  GLUCOSE 369*  BUN 32*  CREATININE 1.30*   ------------------------------------------------------------------------------------------------------------------ CrCl cannot be calculated (Unknown ideal weight.). ------------------------------------------------------------------------------------------------------------------ No results for input(s): TSH, T4TOTAL, T3FREE, THYROIDAB in the last 72 hours.  Invalid input(s): FREET3   Coagulation profile No results for input(s): INR, PROTIME in the last 168 hours. ------------------------------------------------------------------------------------------------------------------- No results for input(s): DDIMER in the last 72  hours. -------------------------------------------------------------------------------------------------------------------  Cardiac Enzymes No results for input(s): CKMB, TROPONINI, MYOGLOBIN in the last 168 hours.  Invalid input(s): CK ------------------------------------------------------------------------------------------------------------------ Invalid input(s): POCBNP   ---------------------------------------------------------------------------------------------------------------  Urinalysis    Component Value Date/Time   COLORURINE YELLOW 11/11/2015 0524   APPEARANCEUR CLEAR 11/11/2015 0524   LABSPEC 1.016 11/11/2015 0524   PHURINE 5.5 11/11/2015 0524   GLUCOSEU >1000* 11/11/2015 0524   HGBUR MODERATE* 11/11/2015 0524   BILIRUBINUR NEGATIVE 11/11/2015 0524   KETONESUR 40* 11/11/2015 0524   PROTEINUR 30* 11/11/2015 0524   UROBILINOGEN 0.2 07/22/2015 1941   NITRITE NEGATIVE 11/11/2015 0524   LEUKOCYTESUR NEGATIVE 11/11/2015 0524    ----------------------------------------------------------------------------------------------------------------  Imaging results:   Dg Chest 2 View  11/11/2015  CLINICAL DATA:  65 year old male with generalized weakness EXAM: CHEST  2 VIEW COMPARISON:  Chest radiograph dated 07/22/2015 FINDINGS: The heart size and mediastinal contours are within normal limits. Both lungs are clear. The visualized skeletal structures are unremarkable. IMPRESSION: No active cardiopulmonary disease. Electronically Signed   By: Anner Crete M.D.   On: 11/11/2015 05:58    My personal review of Initial EKG was SVT at 144, PT EKG normal sinus rhythm heart rate of 83  assessment & Plan  Active Problems:   Diabetes mellitus type 2, uncontrolled ([place])   History of tobacco use   HTN (hypertension)   Protein-calorie malnutrition, severe ([facility])   Diarrhea   Dehydration   Weakness   SVT (supraventricular tachycardia) ([facility])    weakness/dehydration - This is  most likely due to dehydration, volume depletion, and diarrhea, and electrolyte abnormalities. - Continue with IV fluids, try to placement, and will consult PT .  Episode of SVT Diabetes mellitus type II, uncontrolled  - Patient has not been taking his medication for few month , will start on insulin sliding scale .  Hypertension  - not Taking his medication at home,will hold given soft blood pressure .   protein calorie malnutrition/severe - We'll start on ensure.  [place] - will consult wound care,  lesion is localized, does not appear to be any part of systemic process .   acute renal failure - Secondary to volume depletion, continue with IV fluids  Diarrhea - Appears to be chronic, daughter reports it is going on for years  but will check C. difficile and GI panel  Tobacco abuse - Patient was counseled via daughter     DVT Prophylaxis Heparin SCDs   AM Labs Ordered, also please review Full Orders  Family Communication: Admission, patients condition and plan of care including tests being ordered have been discussed with the patient and daughter  who indicate understanding and agree with the plan and Code Status.  Code Status Full  Likely DC to  : Pending PT  Condition GUARDED    Time spent in minutes :50 minutes   Havanna Groner M.D on 11/11/2015 at 7:39 PM  Between 7am to 7pm - Pager - [phone]  After 7pm go to [email or web address] - password [email or web address]  And look for the night coverage person covering me after hours  [facility] Office  [phone]

## 2015-11-11 NOTE — ED Notes (Signed)
Heart healthy lunch tray ordered 

## 2015-11-11 NOTE — ED Provider Notes (Signed)
CSN: [id number]     Arrival date & time 11/11/15  0325 History   By signing my name below, I, Evelene Croon, attest that this documentation has been prepared under the direction and in the presence of Keeva Reisen, MD . Electronically Signed: Evelene Croon, Scribe. 11/11/2015. 4:07 AM.   Chief Complaint  Patient presents with  . Hyperglycemia    Patient is a 65 y.o. male presenting with hyperglycemia. The history is provided by a relative (Daughter). No language interpreter was used.  Hyperglycemia Blood sugar level PTA:  300s Severity:  Moderate Onset quality:  Gradual Timing:  Constant Progression:  Worsening Chronicity:  Chronic Diabetes status:  Controlled with insulin Current diabetic therapy:  None Context: not change in medication   Relieved by:  None tried Ineffective treatments:  None tried Associated symptoms: dehydration, polyuria and weakness (generalized)   Associated symptoms: no altered mental status, no blurred vision, no chest pain, no fever, no increased appetite, no shortness of breath and no syncope   Risk factors: no obesity      HPI Comments:  Brian Owen is a 65 y.o. male with a history of DM, who presents to the Emergency Department via EMS complaining of hyperglycemia with blood sugars in the 300 x "awhile". Pt has been out of his DM medications x ~ 4.5 months. Daughter reports associated appetite change, reports decreased PO intake x 4 days and generalized weakness. He was admitted recently evaluated at [facility] in [place], [place] ~ 3  days ago and placed on Macrobid for UTI. Pt has no pain at this time. History provided by daughter as pt does not speak english.   Past Medical History  Diagnosis Date  . Diabetes mellitus without complication ([place])   . Heavy cigarette smoker    Past Surgical History  Procedure Laterality Date  . No past surgeries     History reviewed. No pertinent family history. Social History  Substance Use  Topics  . Smoking status: Former Smoker -- 40 years    Types: Cigarettes    Quit date: 04/28/2014  . Smokeless tobacco: Former Systems developer    Quit date: 03/29/2014  . Alcohol Use: No    Review of Systems  Constitutional: Positive for appetite change. Negative for fever and chills.  Eyes: Negative for blurred vision.  Respiratory: Negative for shortness of breath.   Cardiovascular: Negative for chest pain and syncope.  Endocrine: Positive for polyuria.  Skin:       Bullae and scabs on the skin of BLE  Neurological: Positive for weakness (generalized).       Global weakness not focal  All other systems reviewed and are negative.   Allergies  Review of patient's allergies indicates no known allergies.  Home Medications   Prior to Admission medications   Medication Sig Start Date End Date Taking? Authorizing Provider  ACCU-CHEK FASTCLIX LANCETS MISC Check BS 4 times daily before meals and at bedtime 07/01/15   Brayton Caves, PA-C  acetaminophen (TYLENOL) 500 MG tablet Take 1,000 mg by mouth every 6 (six) hours as needed for mild pain or headache.    Historical Provider, MD  glucose blood (ACCU-CHEK SMARTVIEW) test strip Use as instructed 07/01/15   Brayton Caves, PA-C  insulin NPH-regular Human (NOVOLIN 70/30) (70-30) 100 UNIT/ML injection Inject 7 Units into the skin 2 (two) times daily with a meal. 07/24/15   Costin Karlyne Greenspan, MD  Insulin Syringes, Disposable, U-100 1 ML MISC 10 Units by Does  not apply route 2 (two) times daily. Patient not taking: Reported on 07/25/2015 06/23/15   Brayton Caves, PA-C  lisinopril (PRINIVIL,ZESTRIL) 10 MG tablet Take 1 tablet (10 mg total) by mouth daily. 07/25/15   Josalyn [name], MD  potassium chloride SA (K-DUR,KLOR-CON) 20 MEQ tablet Take 1 tablet (20 mEq total) by mouth daily. 06/29/15   Tiffany S Noel, PA-C   BP 162/91 mmHg  Pulse 92  Temp(Src) 97.9 F (36.6 C) (Oral)  Resp 24  SpO2 98% Physical Exam  Constitutional: He is oriented to person,  place, and time. He appears well-developed and well-nourished. No distress.  HENT:  Head: Normocephalic and atraumatic.  Mouth/Throat: Oropharynx is clear and moist. No oropharyngeal exudate.  Moist mucous membranes   Eyes: Conjunctivae are normal. Pupils are equal, round, and reactive to light.  Neck: No JVD present.  Trachea midline  Cardiovascular: Normal rate, regular rhythm and normal heart sounds.   Pulmonary/Chest: Effort normal and breath sounds normal. No respiratory distress.  Abdominal: Soft. Bowel sounds are normal. He exhibits no distension.  Musculoskeletal: He exhibits no tenderness.  Neurological: He is alert and oriented to person, place, and time. He has normal reflexes.  Skin: Skin is warm and dry.  Large blister proximal left thigh with bruising.   Psychiatric: He has a normal mood and affect. His behavior is normal.  Nursing note and vitals reviewed.   ED Course  Procedures   DIAGNOSTIC STUDIES:  Oxygen Saturation is 98% on RA, normal by my interpretation.    COORDINATION OF CARE:  4:02 AM Discussed treatment plan with pt and daughter at bedside and they agreed to plan.  Labs Review Labs Reviewed  CBG MONITORING, ED - Abnormal; Notable for the following:    Glucose-Capillary 337 (*)    All other components within normal limits  CBC WITH DIFFERENTIAL/PLATELET  URINALYSIS, ROUTINE W REFLEX MICROSCOPIC (NOT AT [facility])  I-STAT CHEM 8, ED    Imaging Review No results found. I have personally reviewed and evaluated these lab results as part of my medical decision-making.   EKG Interpretation None      MDM   Final diagnoses:  None   Results for orders placed or performed during the hospital encounter of 11/11/15  CBC with Differential/Platelet  Result Value Ref Range   WBC 15.2 (H) 4.0 - 10.5 K/uL   RBC 4.25 4.22 - 5.81 MIL/uL   Hemoglobin 12.9 (L) 13.0 - 17.0 g/dL   HCT 36.9 (L) 39.0 - 52.0 %   MCV 86.8 78.0 - 100.0 fL   MCH 30.4 26.0 - 34.0  pg   MCHC 35.0 30.0 - 36.0 g/dL   RDW 12.8 11.5 - 15.5 %   Platelets 168 150 - 400 K/uL   Neutrophils Relative % 86 %   Neutro Abs 12.9 (H) 1.7 - 7.7 K/uL   Lymphocytes Relative 6 %   Lymphs Abs 1.0 0.7 - 4.0 K/uL   Monocytes Relative 8 %   Monocytes Absolute 1.3 (H) 0.1 - 1.0 K/uL   Eosinophils Relative 0 %   Eosinophils Absolute 0.0 0.0 - 0.7 K/uL   Basophils Relative 0 %   Basophils Absolute 0.0 0.0 - 0.1 K/uL  Urinalysis, Routine w reflex microscopic (not at [facility])  Result Value Ref Range   Color, Urine YELLOW YELLOW   APPearance CLEAR CLEAR   Specific Gravity, Urine 1.016 1.005 - 1.030   pH 5.5 5.0 - 8.0   Glucose, UA >1000 (A) NEGATIVE mg/dL   Hgb  urine dipstick MODERATE (A) NEGATIVE   Bilirubin Urine NEGATIVE NEGATIVE   Ketones, ur 40 (A) NEGATIVE mg/dL   Protein, ur 30 (A) NEGATIVE mg/dL   Nitrite NEGATIVE NEGATIVE   Leukocytes, UA NEGATIVE NEGATIVE  Urine microscopic-add on  Result Value Ref Range   Squamous Epithelial / LPF 0-5 (A) NONE SEEN   WBC, UA 0-5 0 - 5 WBC/hpf   RBC / HPF 6-30 0 - 5 RBC/hpf   Bacteria, UA RARE (A) NONE SEEN  POC CBG, ED  Result Value Ref Range   Glucose-Capillary 337 (H) 65 - 99 mg/dL  I-stat chem 8, ed  Result Value Ref Range   Sodium 132 (L) 135 - 145 mmol/L   Potassium 3.4 (L) 3.5 - 5.1 mmol/L   Chloride 94 (L) 101 - 111 mmol/L   BUN 32 (H) 6 - 20 mg/dL   Creatinine, Ser 1.30 (H) 0.61 - 1.24 mg/dL   Glucose, Bld 369 (H) 65 - 99 mg/dL   Calcium, Ion 1.14 1.13 - 1.30 mmol/L   TCO2 22 0 - 100 mmol/L   Hemoglobin 14.6 13.0 - 17.0 g/dL   HCT 43.0 39.0 - 52.0 %   Dg Chest 2 View  11/11/2015  CLINICAL DATA:  65 year old male with generalized weakness EXAM: CHEST  2 VIEW COMPARISON:  Chest radiograph dated 07/22/2015 FINDINGS: The heart size and mediastinal contours are within normal limits. Both lungs are clear. The visualized skeletal structures are unremarkable. IMPRESSION: No active cardiopulmonary disease. Electronically Signed    By: Anner Crete M.D.   On: 11/11/2015 05:58    Medications  sodium chloride 0.9 % bolus 1,000 mL (not administered)  insulin aspart (novoLOG) injection 3 Units (not administered)  sodium chloride 0.9 % bolus 1,000 mL (1,000 mLs Intravenous New Bag/Given 11/11/15 0427)  metFORMIN (GLUCOPHAGE) tablet 500 mg (500 mg Oral Given 11/11/15 0603)    Myoglobinuria, ? Myositis versus dehydration and protein wasting from malnutrition  Will admit   I personally performed the services described in this documentation, which was scribed in my presence. The recorded information has been reviewed and is accurate.      Veatrice Kells, MD 11/11/15 [phone]

## 2015-11-11 NOTE — Progress Notes (Signed)
   11/11/15 1825  Follow Up  MD notified Hal Hope  Time MD notified 40  Family notified Yes-comment (family present)  Time family notified 1825  Progress note created (see row info) Yes

## 2015-11-11 NOTE — ED Notes (Signed)
Pt's daughter would like the staff to call her if we have any questions. Her name is Reiden Tulk: [phone]

## 2015-11-11 NOTE — Progress Notes (Signed)
Dr. Landis Gandy at bedside. Aware of patient's tachycardia.

## 2015-11-11 NOTE — Progress Notes (Signed)
NURSING PROGRESS NOTE  Daved Bauernfeind [id number] Admission Data: 11/11/2015 4:57 PM Attending Provider: Rise Patience, MD [id number], Lennox Laity, MD    Brian Owen is a 65 y.o. male patient admitted from ED:  -No acute distress noted.  -No complaints of shortness of breath.  -No complaints of chest pain.    Blood pressure 130/72, pulse 84, temperature 98.4 F (36.9 C), temperature source Oral, resp. rate 18, SpO2 93 %.   IV Fluids:  IV in place, occlusive dsg intact without redness, IV cath forearm right, condition patent and no redness  Allergies:  Review of patient's allergies indicates no known allergies.  Past Medical History:   has a past medical history of Diabetes mellitus without complication ([place]) and Heavy cigarette smoker.  Past Surgical History:   has past surgical history that includes No past surgeries.  Social History:   reports that he quit smoking about 18 months ago. His smoking use included Cigarettes. He quit after 40 years of use. He quit smokeless tobacco use about 19 months ago. He reports that he does not drink alcohol or use illicit drugs.    Patient orientated to room. Information packet given to patient. Admission inpatient armband information verified with patient to include name and date of birth and placed on patient arm. Side rails up x 2, fall assessment and education completed with patient. Patient able to verbalize understanding of risk associated with falls and verbalized understanding to call for assistance before getting out of bed. Call light within reach. Patient verbalizes understanding of unit orientation instructions.    Will continue to evaluate and treat per MD orders.     Doristine Devoid, RN

## 2015-11-12 ENCOUNTER — Observation Stay (HOSPITAL_COMMUNITY): Payer: Medicare Other

## 2015-11-12 DIAGNOSIS — Z87891 Personal history of nicotine dependence: Secondary | ICD-10-CM

## 2015-11-12 DIAGNOSIS — E43 Unspecified severe protein-calorie malnutrition: Secondary | ICD-10-CM | POA: Diagnosis not present

## 2015-11-12 DIAGNOSIS — E1100 Type 2 diabetes mellitus with hyperosmolarity without nonketotic hyperglycemic-hyperosmolar coma (NKHHC): Secondary | ICD-10-CM

## 2015-11-12 DIAGNOSIS — A09 Infectious gastroenteritis and colitis, unspecified: Secondary | ICD-10-CM

## 2015-11-12 DIAGNOSIS — I471 Supraventricular tachycardia: Secondary | ICD-10-CM

## 2015-11-12 LAB — HEMOGLOBIN A1C
Hgb A1c MFr Bld: 12.2 % — ABNORMAL HIGH (ref 4.8–5.6)
MEAN PLASMA GLUCOSE: 303 mg/dL

## 2015-11-12 LAB — MAGNESIUM: MAGNESIUM: 2 mg/dL (ref 1.7–2.4)

## 2015-11-12 LAB — COMPREHENSIVE METABOLIC PANEL
ALT: 12 U/L — ABNORMAL LOW (ref 17–63)
AST: 13 U/L — ABNORMAL LOW (ref 15–41)
Albumin: 2.4 g/dL — ABNORMAL LOW (ref 3.5–5.0)
Alkaline Phosphatase: 89 U/L (ref 38–126)
Anion gap: 11 (ref 5–15)
BUN: 19 mg/dL (ref 6–20)
CHLORIDE: 107 mmol/L (ref 101–111)
CO2: 20 mmol/L — AB (ref 22–32)
CREATININE: 1.34 mg/dL — AB (ref 0.61–1.24)
Calcium: 8.9 mg/dL (ref 8.9–10.3)
GFR calc non Af Amer: 54 mL/min — ABNORMAL LOW (ref >60)
Glucose, Bld: 283 mg/dL — ABNORMAL HIGH (ref 65–99)
POTASSIUM: 4 mmol/L (ref 3.5–5.1)
SODIUM: 138 mmol/L (ref 135–145)
Total Bilirubin: 0.7 mg/dL (ref 0.3–1.2)
Total Protein: 6.2 g/dL — ABNORMAL LOW (ref 6.5–8.1)

## 2015-11-12 LAB — BASIC METABOLIC PANEL
ANION GAP: 16 — AB (ref 5–15)
BUN: 22 mg/dL — ABNORMAL HIGH (ref 6–20)
CO2: 18 mmol/L — ABNORMAL LOW (ref 22–32)
Calcium: 9.1 mg/dL (ref 8.9–10.3)
Chloride: 104 mmol/L (ref 101–111)
Creatinine, Ser: 1.47 mg/dL — ABNORMAL HIGH (ref 0.61–1.24)
GFR, EST AFRICAN AMERICAN: 56 mL/min — AB (ref >60)
GFR, EST NON AFRICAN AMERICAN: 49 mL/min — AB (ref >60)
Glucose, Bld: 412 mg/dL — ABNORMAL HIGH (ref 65–99)
POTASSIUM: 3.2 mmol/L — AB (ref 3.5–5.1)
SODIUM: 138 mmol/L (ref 135–145)

## 2015-11-12 LAB — CBC
HCT: 34.9 % — ABNORMAL LOW (ref 39.0–52.0)
Hemoglobin: 11.9 g/dL — ABNORMAL LOW (ref 13.0–17.0)
MCH: 30.1 pg (ref 26.0–34.0)
MCHC: 34.1 g/dL (ref 30.0–36.0)
MCV: 88.4 fL (ref 78.0–100.0)
PLATELETS: 192 10*3/uL (ref 150–400)
RBC: 3.95 MIL/uL — ABNORMAL LOW (ref 4.22–5.81)
RDW: 13 % (ref 11.5–15.5)
WBC: 15.8 10*3/uL — AB (ref 4.0–10.5)

## 2015-11-12 LAB — C DIFFICILE QUICK SCREEN W PCR REFLEX
C DIFFICLE (CDIFF) ANTIGEN: POSITIVE — AB
C Diff toxin: NEGATIVE

## 2015-11-12 LAB — GLUCOSE, CAPILLARY
GLUCOSE-CAPILLARY: 277 mg/dL — AB (ref 65–99)
Glucose-Capillary: 355 mg/dL — ABNORMAL HIGH (ref 65–99)
Glucose-Capillary: 199 mg/dL — ABNORMAL HIGH (ref 65–99)
Glucose-Capillary: 218 mg/dL — ABNORMAL HIGH (ref 65–99)

## 2015-11-12 LAB — TSH: TSH: 0.302 u[IU]/mL — AB (ref 0.350–4.500)

## 2015-11-12 MED ORDER — PRO-STAT SUGAR FREE PO LIQD
30.0000 mL | Freq: Three times a day (TID) | ORAL | Status: DC
  Administered 2015-11-12 – 2015-11-13 (×4): 30 mL via ORAL
  Filled 2015-11-12 (×4): qty 30

## 2015-11-12 MED ORDER — POTASSIUM CHLORIDE IN NACL 20-0.9 MEQ/L-% IV SOLN
INTRAVENOUS | Status: DC
  Administered 2015-11-13: 05:00:00 via INTRAVENOUS
  Filled 2015-11-12 (×3): qty 1000

## 2015-11-12 MED ORDER — INSULIN ASPART 100 UNIT/ML ~~LOC~~ SOLN
0.0000 [IU] | Freq: Every day | SUBCUTANEOUS | Status: DC
  Administered 2015-11-12: 2 [IU] via SUBCUTANEOUS

## 2015-11-12 MED ORDER — INSULIN GLARGINE 100 UNIT/ML ~~LOC~~ SOLN
20.0000 [IU] | Freq: Every day | SUBCUTANEOUS | Status: DC
  Administered 2015-11-12 – 2015-11-13 (×2): 20 [IU] via SUBCUTANEOUS
  Filled 2015-11-12 (×2): qty 0.2

## 2015-11-12 MED ORDER — POTASSIUM CHLORIDE CRYS ER 20 MEQ PO TBCR
30.0000 meq | EXTENDED_RELEASE_TABLET | ORAL | Status: AC
  Administered 2015-11-12 (×2): 30 meq via ORAL
  Filled 2015-11-12 (×2): qty 1

## 2015-11-12 MED ORDER — INSULIN ASPART 100 UNIT/ML ~~LOC~~ SOLN
0.0000 [IU] | Freq: Three times a day (TID) | SUBCUTANEOUS | Status: DC
  Administered 2015-11-12: 8 [IU] via SUBCUTANEOUS
  Administered 2015-11-12: 15 [IU] via SUBCUTANEOUS
  Administered 2015-11-12: 3 [IU] via SUBCUTANEOUS
  Administered 2015-11-13: 11 [IU] via SUBCUTANEOUS
  Administered 2015-11-13: 3 [IU] via SUBCUTANEOUS

## 2015-11-12 MED ORDER — METOPROLOL TARTRATE 25 MG PO TABS
25.0000 mg | ORAL_TABLET | Freq: Two times a day (BID) | ORAL | Status: DC
  Administered 2015-11-12 – 2015-11-13 (×3): 25 mg via ORAL
  Filled 2015-11-12 (×3): qty 1

## 2015-11-12 MED ORDER — SODIUM CHLORIDE 0.9 % IV BOLUS (SEPSIS)
250.0000 mL | Freq: Once | INTRAVENOUS | Status: AC
  Administered 2015-11-12: 250 mL via INTRAVENOUS

## 2015-11-12 NOTE — Evaluation (Signed)
Physical Therapy Evaluation Patient Details Name: Brian Owen MRN: [id number] DOB: [birth year] Today's Date: 11/12/2015   History of Present Illness  Adm with dehydration, diarrhea. Apparently has chronic diarrhea and (via interpreter) he states he has incontinence of bowels at home. PMHx-uncontrolled DM (noncompliant), bullae over arms and legs "for years"  Clinical Impression  Pt admitted with above diagnosis. Pt very unsteady with RW as urgently trying to get to bathroom. Via interpreter he states he is weaker and more off-balance than usual, but that family can assist him with walking as PT did today. (No family present to confirm). Pt currently with functional limitations due to the deficits listed below (see PT Problem List).  Pt will benefit from skilled PT to increase their independence and safety with mobility to allow discharge to the venue listed below.       Follow Up Recommendations SNF;Supervision/Assistance - 24 hour (if family can provide min-mod assist 24/7, could go home with HHPT)    Equipment Recommendations  3in1 (PT)    Recommendations for Other Services       Precautions / Restrictions Precautions Precautions: Fall (incontinence) Precaution Comments: use briefs for mobility      Mobility  Bed Mobility Overal bed mobility: Modified Independent             General bed mobility comments: as pt came to EOB, noted bed soaked with urine and diarrhea  Transfers Overall transfer level: Needs assistance Equipment used: Rolling walker (2 wheeled) Transfers: Sit to/from Stand Sit to Stand: Min assist         General transfer comment: x2  Ambulation/Gait Ambulation/Gait assistance: Mod assist Ambulation Distance (Feet): 10 Feet (toileted, 10) Assistive device: Rolling walker (2 wheeled) Gait Pattern/deviations: Step-through pattern;Decreased stride length;Wide base of support;Leaning posteriorly;Ataxic (LOB posteriorly with RW lifting off floor x 2)      General Gait Details: stood to perform pericare at EOB and pt urgently needed to use bathroom; walked very unsteady with RW 5 ft and began to urinate on floor as continued to the bathroom  Stairs            Wheelchair Mobility    Modified Rankin (Stroke Patients Only)       Balance Overall balance assessment: Needs assistance Sitting-balance support: No upper extremity supported;Feet supported Sitting balance-Leahy Scale: Good     Standing balance support: Bilateral upper extremity supported Standing balance-Leahy Scale: Poor                               Pertinent Vitals/Pain Pain Assessment: Faces Faces Pain Scale: Hurts a little bit Pain Location: unknown    Home Living Family/patient expects to be discharged to:: Private residence Living Arrangements: Spouse/significant other;Children;Other relatives Available Help at Discharge: Family;Available 24 hours/day Type of Home: Apartment Home Access: Stairs to enter Entrance Stairs-Rails: Right Entrance Stairs-Number of Steps: flight Home Layout: One level Home Equipment: None (wife has RW )      Prior Function Level of Independence: Needs assistance   Gait / Transfers Assistance Needed: walks with RW and assist at home  ADL's / Homemaking Assistance Needed: incontinent of bowels (not urine per pt via interpreter)        Hand Dominance        Extremity/Trunk Assessment   Upper Extremity Assessment: Generalized weakness           Lower Extremity Assessment: Generalized weakness      Cervical / Trunk  Assessment: Other exceptions  Communication   Communication: Prefers language other than English ([place]; used Geophysical data processor throughout session)  Cognition Arousal/Alertness: Awake/alert Behavior During Therapy: Impulsive (related to needing to use bathroom and rushes) Overall Cognitive Status: No family/caregiver present to determine baseline cognitive functioning (via  interpreter stated lived alone; then stated with family)                      General Comments General comments (skin integrity, edema, etc.): no family present    Exercises        Assessment/Plan    PT Assessment Patient needs continued PT services  PT Diagnosis Generalized weakness;Difficulty walking   PT Problem List Decreased strength;Decreased balance;Decreased mobility;Decreased coordination;Decreased cognition;Decreased knowledge of use of DME;Decreased safety awareness;Other (comment) (incontinence)  PT Treatment Interventions DME instruction;Gait training;Stair training;Functional mobility training;Therapeutic activities;Balance training;Therapeutic exercise;Cognitive remediation;Patient/family education   PT Goals (Current goals can be found in the Care Plan section) Acute Rehab PT Goals Patient Stated Goal: return home soon PT Goal Formulation: With patient (via interpreter) Time For Goal Achievement: 11/26/15 Potential to Achieve Goals: Fair    Frequency Min 3X/week   Barriers to discharge        Co-evaluation               End of Session   Activity Tolerance: Patient tolerated treatment well Patient left: in bed;with call bell/phone within reach;with nursing/sitter in room Nurse Communication: Mobility status (need 2 person assist to walk; use BSC)    Functional Assessment Tool Used: clinical judgement Functional Limitation: Mobility: Walking and moving around Mobility: Walking and Moving Around Current Status [id number]): At least 40 percent but less than 60 percent impaired, limited or restricted Mobility: Walking and Moving Around Goal Status [phone]): At least 20 percent but less than 40 percent impaired, limited or restricted    Time: [id number] PT Time Calculation (min) (ACUTE ONLY): 47 min   Charges:   PT Evaluation $PT Eval Moderate Complexity: 1 Procedure PT Treatments $Gait Training: 8-22 mins $Therapeutic Activity: 8-22 mins   PT  G Codes:   PT G-Codes **NOT FOR INPATIENT CLASS** Functional Assessment Tool Used: clinical judgement Functional Limitation: Mobility: Walking and moving around Mobility: Walking and Moving Around Current Status [id number]): At least 40 percent but less than 60 percent impaired, limited or restricted Mobility: Walking and Moving Around Goal Status [phone]): At least 20 percent but less than 40 percent impaired, limited or restricted    Emalene Welte 11/12/2015, 3:14 PM Pager [phone]

## 2015-11-12 NOTE — Consult Note (Signed)
WOC wound consult note Reason for Consult: blisters left FA, left thigh Wound type: ruptured bulla, partial thickness, unclear etiology.   Patient has scarring on the LE that look similar in presentation but have healed.  Measurement: Left forearm: 8cm x 5cm x 0.1cm Left thigh: 7cm x 2.5cm x 0.1cm  Wound bed: both areas are clean, pink, moist, not infected Drainage (amount, consistency, odor) minimal, serous  Periwound: intact  Dressing procedure/placement/frequency: Soft silicone foam to the left thigh, due to the fact he is having loose, frequent stools I do not want to place gauze dressing around the leg. Adaptic non adherent to the left forearm. Change both every other day. Noted patient had loose stool at the time of my assessment, while assisting bedside nurse to clean patient up patient noted to have MASD (moisture associated skin damage), applied barrier cream after cleaning the area.  Orders written for continued, frequent use of barrier cream.   Discussed POC with patient and bedside nurse.  Re consult if needed, will not follow at this time. Thanks  Ginelle Bays Kellogg, [place] [phone])

## 2015-11-12 NOTE — Progress Notes (Signed)
Patient Demographics:    Brian Owen, is a 65 y.o. male, DOB - 1951/07/29, [id number]  Admit date - 11/11/2015   Admitting Physician Rise Patience, MD  Outpatient Primary MD for the patient is [name], MD  LOS -    Chief Complaint  Patient presents with  . Hyperglycemia        Subjective:    [name] today has, No headache, No chest pain, No abdominal pain - No Nausea, No new weakness tingling or numbness, No Cough - SOB. No diarrhea this morning.   Assessment  & Plan :     1. DM type II in poor control. A1c was 12, question compliance, placed on Lantus and sliding scale. We will monitor CBGs.  CBG (last 3)   Recent Labs  11/11/15 2159 11/11/15 2354 11/12/15 0752  GLUCAP 405* 413* 277*    2. Chronic diarrhea. Rule out C. difficile than supportive care and outpatient GI follow-up.  3. Severe protein calorie malnutrition. Place on protein supplement.  4. Brief run of SVT in the ER. Has resolved and sinus, place on low-dose beta blocker, recent echo noted with EF of 60% without any acute changes done in late 2015. He is on low-dose beta blocker, check TSH.  5. Smoking. Counseled to quit.  6. Localized bulla on right upper extremity. Currently resolved. No other skin rashes.  7. Dehydration with ARF. Hydrate and monitor.    Code Status : Full  Family Communication  : none  Disposition Plan  : Home 1-2 days  Consults  :    Procedures  :    DVT Prophylaxis  :   Heparin    Lab Results  Component Value Date   PLT 192 11/12/2015    Inpatient Medications  Scheduled Meds: . feeding supplement (ENSURE ENLIVE)  237 mL Oral BID BM  . heparin  5,000 Units Subcutaneous 3 times per day  . insulin aspart  0-15 Units Subcutaneous TID WC  . insulin aspart  0-5  Units Subcutaneous QHS  . insulin glargine  20 Units Subcutaneous Daily  . metoprolol tartrate  12.5 mg Oral BID  . sodium chloride  3 mL Intravenous Q12H   Continuous Infusions: . 0.9 % NaCl with KCl 20 mEq / L 100 mL/hr at 11/12/15 0717   PRN Meds:.  Antibiotics  :     Anti-infectives    None        Objective:   Filed Vitals:   11/11/15 2020 11/11/15 2200 11/12/15 0623 11/12/15 0920  BP: 133/73 106/54 144/74 139/69  Pulse: 85 80 90 76  Temp: 97.9 F (36.6 C) 98.2 F (36.8 C) 98.3 F (36.8 C)   TempSrc: Oral Oral Oral   Resp: 18 19 19    SpO2: 100% 100% 100%     Wt Readings from Last 3 Encounters:  07/25/15 51.71 kg (114 lb)  07/23/15 50.7 kg (111 lb 12.4 oz)  06/23/15 50.894 kg (112 lb 3.2 oz)     Intake/Output Summary (Last 24 hours) at 11/12/15 1110 Last data filed at 11/12/15 0717  Gross per 24 hour  Intake 1786.67 ml  Output    350 ml  Net 1436.67 ml     Physical Exam  Awake  Alert, Oriented X 3, No new F.N deficits, Normal affect [place].AT,PERRAL Supple Neck,No JVD, No cervical lymphadenopathy appriciated.  Symmetrical Chest wall movement, Good air movement bilaterally, CTAB RRR,No Gallops,Rubs or new Murmurs, No Parasternal Heave +ve B.Sounds, Abd Soft, No tenderness, No organomegaly appriciated, No rebound - guarding or rigidity. No Cyanosis, Clubbing or edema, No new Rash or bruise       Data Review:   Micro Results No results found for this or any previous visit (from the past 240 hour(s)).  Radiology Reports Dg Chest 2 View  11/11/2015  CLINICAL DATA:  65 year old male with generalized weakness EXAM: CHEST  2 VIEW COMPARISON:  Chest radiograph dated 07/22/2015 FINDINGS: The heart size and mediastinal contours are within normal limits. Both lungs are clear. The visualized skeletal structures are unremarkable. IMPRESSION: No active cardiopulmonary disease. Electronically Signed   By: Anner Crete M.D.   On: 11/11/2015 05:58      CBC  Recent Labs Lab 11/11/15 0406 11/11/15 0409 11/11/15 2011 11/12/15 0555  WBC 15.2*  --  15.8* 15.8*  HGB 12.9* 14.6 12.7* 11.9*  HCT 36.9* 43.0 37.6* 34.9*  PLT 168  --  165 192  MCV 86.8  --  89.5 88.4  MCH 30.4  --  30.2 30.1  MCHC 35.0  --  33.8 34.1  RDW 12.8  --  13.1 13.0  LYMPHSABS 1.0  --   --   --   MONOABS 1.3*  --   --   --   EOSABS 0.0  --   --   --   BASOSABS 0.0  --   --   --     Chemistries   Recent Labs Lab 11/11/15 0409 11/11/15 2011 11/12/15 0045 11/12/15 0555  NA 132* 137 138 138  K 3.4* 3.4* 3.2* 4.0  CL 94* 104 104 107  CO2  --  17* 18* 20*  GLUCOSE 369* 390* 412* 283*  BUN 32* 22* 22* 19  CREATININE 1.30* 1.54* 1.47* 1.34*  CALCIUM  --  8.9 9.1 8.9  MG  --  1.9 2.0  --   AST  --   --   --  13*  ALT  --   --   --  12*  ALKPHOS  --   --   --  89  BILITOT  --   --   --  0.7   ------------------------------------------------------------------------------------------------------------------ No results for input(s): CHOL, HDL, LDLCALC, TRIG, CHOLHDL, LDLDIRECT in the last 72 hours.  Lab Results  Component Value Date   HGBA1C 12.2* 11/11/2015   ------------------------------------------------------------------------------------------------------------------ No results for input(s): TSH, T4TOTAL, T3FREE, THYROIDAB in the last 72 hours.  Invalid input(s): FREET3 ------------------------------------------------------------------------------------------------------------------ No results for input(s): VITAMINB12, FOLATE, FERRITIN, TIBC, IRON, RETICCTPCT in the last 72 hours.  Coagulation profile No results for input(s): INR, PROTIME in the last 168 hours.  No results for input(s): DDIMER in the last 72 hours.  Cardiac Enzymes No results for input(s): CKMB, TROPONINI, MYOGLOBIN in the last 168 hours.  Invalid input(s):  CK ------------------------------------------------------------------------------------------------------------------ No results found for: BNP  Time Spent in minutes   35   Zyron Deeley K M.D on 11/12/2015 at 11:10 AM  Between 7am to 7pm - Pager - [phone]  After 7pm go to [email or web address] - password [facility]  [facility] -  Office  [phone]

## 2015-11-13 ENCOUNTER — Observation Stay (HOSPITAL_BASED_OUTPATIENT_CLINIC_OR_DEPARTMENT_OTHER): Payer: Medicare Other

## 2015-11-13 DIAGNOSIS — A09 Infectious gastroenteritis and colitis, unspecified: Secondary | ICD-10-CM | POA: Diagnosis not present

## 2015-11-13 DIAGNOSIS — I10 Essential (primary) hypertension: Secondary | ICD-10-CM | POA: Diagnosis not present

## 2015-11-13 DIAGNOSIS — E1100 Type 2 diabetes mellitus with hyperosmolarity without nonketotic hyperglycemic-hyperosmolar coma (NKHHC): Secondary | ICD-10-CM | POA: Diagnosis not present

## 2015-11-13 DIAGNOSIS — R000 Tachycardia, unspecified: Secondary | ICD-10-CM

## 2015-11-13 DIAGNOSIS — E43 Unspecified severe protein-calorie malnutrition: Secondary | ICD-10-CM | POA: Diagnosis not present

## 2015-11-13 DIAGNOSIS — Z87891 Personal history of nicotine dependence: Secondary | ICD-10-CM | POA: Diagnosis not present

## 2015-11-13 LAB — GASTROINTESTINAL PANEL BY PCR, STOOL (REPLACES STOOL CULTURE)
ASTROVIRUS: NOT DETECTED
Adenovirus F40/41: NOT DETECTED
CAMPYLOBACTER SPECIES: NOT DETECTED
Cryptosporidium: NOT DETECTED
Cyclospora cayetanensis: NOT DETECTED
E. COLI O157: NOT DETECTED
ENTAMOEBA HISTOLYTICA: NOT DETECTED
ENTEROTOXIGENIC E COLI (ETEC): NOT DETECTED
Enteroaggregative E coli (EAEC): NOT DETECTED
Enteropathogenic E coli (EPEC): NOT DETECTED
Giardia lamblia: NOT DETECTED
NOROVIRUS GI/GII: NOT DETECTED
PLESIMONAS SHIGELLOIDES: NOT DETECTED
ROTAVIRUS A: NOT DETECTED
SALMONELLA SPECIES: NOT DETECTED
SAPOVIRUS (I, II, IV, AND V): NOT DETECTED
SHIGA LIKE TOXIN PRODUCING E COLI (STEC): NOT DETECTED
SHIGELLA/ENTEROINVASIVE E COLI (EIEC): NOT DETECTED
Vibrio cholerae: NOT DETECTED
Vibrio species: NOT DETECTED
Yersinia enterocolitica: NOT DETECTED

## 2015-11-13 LAB — BASIC METABOLIC PANEL
ANION GAP: 7 (ref 5–15)
BUN: 16 mg/dL (ref 6–20)
CO2: 24 mmol/L (ref 22–32)
Calcium: 9.1 mg/dL (ref 8.9–10.3)
Chloride: 109 mmol/L (ref 101–111)
Creatinine, Ser: 1.26 mg/dL — ABNORMAL HIGH (ref 0.61–1.24)
GFR calc Af Amer: >60 mL/min (ref >60)
GFR calc non Af Amer: 59 mL/min — ABNORMAL LOW (ref >60)
GLUCOSE: 210 mg/dL — AB (ref 65–99)
POTASSIUM: 3.3 mmol/L — AB (ref 3.5–5.1)
Sodium: 140 mmol/L (ref 135–145)

## 2015-11-13 LAB — CBC
HEMATOCRIT: 31.5 % — AB (ref 39.0–52.0)
Hemoglobin: 10.8 g/dL — ABNORMAL LOW (ref 13.0–17.0)
MCH: 30 pg (ref 26.0–34.0)
MCHC: 34.3 g/dL (ref 30.0–36.0)
MCV: 87.5 fL (ref 78.0–100.0)
Platelets: 190 10*3/uL (ref 150–400)
RBC: 3.6 MIL/uL — AB (ref 4.22–5.81)
RDW: 13.4 % (ref 11.5–15.5)
WBC: 11.3 10*3/uL — AB (ref 4.0–10.5)

## 2015-11-13 LAB — MAGNESIUM: Magnesium: 1.9 mg/dL (ref 1.7–2.4)

## 2015-11-13 LAB — GLUCOSE, CAPILLARY
Glucose-Capillary: 197 mg/dL — ABNORMAL HIGH (ref 65–99)
Glucose-Capillary: 328 mg/dL — ABNORMAL HIGH (ref 65–99)

## 2015-11-13 LAB — T4, FREE: FREE T4: 1.17 ng/dL — AB (ref 0.61–1.12)

## 2015-11-13 MED ORDER — PRO-STAT SUGAR FREE PO LIQD: 30.0000 mL | Freq: Three times a day (TID) | ORAL | Status: DC

## 2015-11-13 MED ORDER — VANCOMYCIN 50 MG/ML ORAL SOLUTION: ORAL | Status: DC

## 2015-11-13 MED ORDER — POTASSIUM CHLORIDE CRYS ER 20 MEQ PO TBCR
40.0000 meq | EXTENDED_RELEASE_TABLET | ORAL | Status: AC
  Administered 2015-11-13 (×2): 40 meq via ORAL
  Filled 2015-11-13 (×2): qty 2

## 2015-11-13 MED ORDER — VANCOMYCIN 50 MG/ML ORAL SOLUTION
250.0000 mg | Freq: Four times a day (QID) | ORAL | Status: DC
  Administered 2015-11-13 (×2): 250 mg via ORAL
  Filled 2015-11-13 (×5): qty 5

## 2015-11-13 MED ORDER — METOPROLOL TARTRATE 25 MG PO TABS: 25.0000 mg | ORAL_TABLET | Freq: Two times a day (BID) | ORAL | Status: DC

## 2015-11-13 MED ORDER — INSULIN NPH ISOPHANE & REGULAR (70-30) 100 UNIT/ML ~~LOC~~ SUSP: 12.0000 [IU] | Freq: Two times a day (BID) | SUBCUTANEOUS | Status: DC

## 2015-11-13 NOTE — Discharge Instructions (Signed)
Follow with Primary MD [name], MD in 7 days   Get CBC, CMP, 2 view Chest X ray checked  by Primary MD next visit.    Activity: As tolerated with Full fall precautions use walker/cane & assistance as needed   Disposition Home    Diet:   Heart Healthy  with feeding assistance and aspiration precautions.  For Heart failure patients - Check your Weight same time everyday, if you gain over 2 pounds, or you develop in leg swelling, experience more shortness of breath or chest pain, call your Primary MD immediately. Follow Cardiac Low Salt Diet and 1.5 lit/day fluid restriction.   On your next visit with your primary care physician please Get Medicines reviewed and adjusted.   Please request your Prim.MD to go over all Hospital Tests and Procedure/Radiological results at the follow up, please get all Hospital records sent to your Prim MD by signing hospital release before you go home.   If you experience worsening of your admission symptoms, develop shortness of breath, life threatening emergency, suicidal or homicidal thoughts you must seek medical attention immediately by calling 911 or calling your MD immediately  if symptoms less severe.  You Must read complete instructions/literature along with all the possible adverse reactions/side effects for all the Medicines you take and that have been prescribed to you. Take any new Medicines after you have completely understood and accpet all the possible adverse reactions/side effects.   Do not drive, operating heavy machinery, perform activities at heights, swimming or participation in water activities or provide baby sitting services if your were admitted for syncope or siezures until you have seen by Primary MD or a Neurologist and advised to do so again.  Do not drive when taking Pain medications.    Do not take more than prescribed Pain, Sleep and Anxiety Medications  Special Instructions: If you have smoked or chewed Tobacco   in the last 2 yrs please stop smoking, stop any regular Alcohol  and or any Recreational drug use.  Wear Seat belts while driving.   Please note  You were cared for by a hospitalist during your hospital stay. If you have any questions about your discharge medications or the care you received while you were in the hospital after you are discharged, you can call the unit and asked to speak with the hospitalist on call if the hospitalist that took care of you is not available. Once you are discharged, your primary care physician will handle any further medical issues. Please note that NO REFILLS for any discharge medications will be authorized once you are discharged, as it is imperative that you return to your primary care physician (or establish a relationship with a primary care physician if you do not have one) for your aftercare needs so that they can reassess your need for medications and monitor your lab values.

## 2015-11-13 NOTE — Progress Notes (Signed)
  Echocardiogram 2D Echocardiogram has been performed.  Donata Clay 11/13/2015, 11:58 AM

## 2015-11-13 NOTE — Progress Notes (Signed)
Pt and daughter given discharge instructions, prescriptions, and care notes. Pt verbalized understanding AEB no further questions or concerns at this time. IV was discontinued, no redness, pain, or swelling noted at this time. Telemetry discontinued and Centralized Telemetry was notified. Pt left the floor via wheelchair with staff in stable condition.

## 2015-11-13 NOTE — Care Management Note (Addendum)
Case Management Note  Patient Details  Name: Brian Owen MRN: [id number] Date of Birth: 1951-06-12  Subjective/Objective:                  Dehydration; CDIFF  Action/Plan: Cm spoke to patient at the bedside using interpreter for [place]. Patient said that he does not have DME at home and needs wheelchair, walker, and 3N1. Patient going home with daughter who can provide 24 hours assistance along with other family members. CM offered patient choice for [facility] care and DME and patient chose [facility]. CM called Tiffany with [facility] to advise of referral for PT/RN, Aide and referral accepted. Cm called daughter [name] who patient lives with and number is [phone]. Daughter confirmed that patient will have 24 hour assistance at home with the family. Daughter in agreement with [facility] choice. Daughter said that the patient would benefit from Wheelchair as he gets unsteady and tied very easily. CM paged Dr. Candiss Norse for orders. Cm explained to family that medicare will only cover the walker or the wheelchair but not both. She said that they have her mother's walker at home if he needed it but they prefer to have the wheelchair for safety purposes. CM called Jermaine with [facility] and 3N1 and Wheelchair to be delivered to the bedside. No further CM needs communicated. CM called nurse to advise and she said 3N1 and walker at the bedside and CM explained that wheelchair would be delivered to the room.   Expected Discharge Date:  11/13/15               Expected Discharge Plan:  [place]  In-House Referral:     Discharge planning Services  CM Consult  Post Acute Care Choice:  Durable Medical Equipment, Home Health Choice offered to:  Patient, Adult Children  DME Arranged:  3N1, wheelchair DME Agency:  [place]:  RN, PT, Nurse's Aide [place] Agency:  [place]  Status of Service:  In process, will continue to follow  Medicare Important Message Given:    Date Medicare  IM Given:    Medicare IM give by:    Date Additional Medicare IM Given:    Additional Medicare Important Message give by:     If discussed at [place] of Stay Meetings, dates discussed:    Additional Comments:  Guido Sander, RN 11/13/2015, 1:19 PM

## 2015-11-13 NOTE — Discharge Summary (Signed)
Brian Owen, is a 65 y.o. male  DOB [birth year]  MRN [id number].  Admission date:  11/11/2015  Admitting Physician  Rise Patience, MD  Discharge Date:  11/13/2015   Primary MD  [name], MD  Recommendations for primary care physician for things to follow:   Check TSH, free T4 and T3 in 1 week.   Monitor glycemic control closely, patient noncompliant with insulin.  CBC, BMP in 5-7 days.  A dermatology follow-up in one week if skin blisters recur   Admission Diagnosis  Dehydration [E86.0] Hyperglycemia [R73.9] Physical deconditioning [R53.81] Bullae [L98.8]   Discharge Diagnosis  Dehydration [E86.0] Hyperglycemia [R73.9] Physical deconditioning [R53.81] Bullae [L98.8]    Active Problems:   Diabetes mellitus type 2, uncontrolled (HCC)   History of tobacco use   HTN (hypertension)   Protein-calorie malnutrition, severe (HCC)   Diarrhea   Dehydration   Weakness   SVT (supraventricular tachycardia) (HCC)      Past Medical History  Diagnosis Date  . Heavy cigarette smoker   . Bullae 11/11/2015    legs/notes 11/11/2015  . Uncontrolled type II diabetes mellitus ([place]) dx'd 2013    Past Surgical History  Procedure Laterality Date  . No past surgeries         HPI  from the history and physical done on the day of admission:    Brian Owen is a 65 y.o. male, with past medical history of hypertension, diabetes mellitus, medication noncompliance, a shunt was visiting his daughter in [place], recently came back, agent is [place] speaking, daughter at bedside translating, she brought him over for weakness, and diarrhea, reports area been going on for years, ports he has out of his insulin for a few months now, as well developed some bullae in left thigh over few weeks, the ports he has been  losing weight, with poor appetite, as was negative, chest x-ray with no acute cardiopulmonary disease, Workup was significant for mild acute renal failure, clinical dehydration, and hyperglycemia, patient was noticed to have tachycardia on the floor, EKG showing SVT, inverted back to normal sinus rhythm after 5 mg of IV metoprolol push.     Hospital Course:     1. DM type II in poor control. A1c was 12, question compliance, and says he stopped taking his insulin, he was placed on Lantus and sliding scale. He is stable, we'll adjust his home dose 70/30 from 7 twice a day 2 rales twice a day. Requested to do Accu-Cheks every before meals at bedtime maintenance log book and showed to PCP in a week. Counseled on compliance.  Lab Results  Component Value Date   HGBA1C 12.2* 11/11/2015    CBG (last 3)   Recent Labs  11/12/15 1714 11/12/15 2214 11/13/15 0807  GLUCAP 199* 218* 197*     2. Chronic diarrhea. He was positive for C. difficile antigen, toxin was negative, he was initially placed on vancomycin with good results his diarrhea has resolved, he could have colonize edition but due to response  with vancomycin and place him on 1 week of oral vancomycin, if diarrhea reoccurs recommend outpatient GI follow-up.  3. Severe protein calorie malnutrition. Placed on protein supplement.  4. Brief run of SVT in the ER. Has resolved and sinus, place on low-dose beta blocker, recent echo noted with EF of 60% without any acute changes done in late 2015. He is on low-dose beta blocker, TSH was mildly low.  5. Smoking. Counseled to quit.  6. Localized bulla on left thigh and arm. Currently resolved. No other skin rashes. Small and they're under bandage, no new ulcers, if reoccurs outpatient dermatology follow-up.  7. Dehydration with ARF. I treated and stable. Replace potassium again today. Request PCP to check BMP in 5-7 days.  8. TSH was mildly low, ordered free T4 and T3, request PCP to check  TSH, free T4 and T3 in a week. This could be mild sick euthyroid syndrome however if his TSH continues to indicate mild hyperthyroidism then low-dose methimazole should be started with outpatient and a follow-up. For now he will be placed on low-dose beta blocker.    Discharge Condition: Stable  Follow UP  Follow-up Information    Follow up with [name], MD. Schedule an appointment as soon as possible for a visit in 1 week.   Specialty:  Family Medicine   Contact information:   [place] [address] [phone]        Consults obtained - None  Diet and Activity recommendation: See Discharge Instructions below  Discharge Instructions       Discharge Instructions    Discharge instructions    Complete by:  As directed   Follow with Primary MD [name], MD in 7 days   Get CBC, CMP, 2 view Chest X ray checked  by Primary MD next visit.    Activity: As tolerated with Full fall precautions use walker/cane & assistance as needed   Disposition Home     Diet:   Heart Healthy Low Carb , with feeding assistance and aspiration precautions.  Accuchecks 4 times/day, Once in AM empty stomach and then before each meal. Log in all results and show them to your Prim.MD in 3 days. If any glucose reading is under 80 or above 300 call your Prim MD immidiately. Follow Low glucose instructions for glucose under 80 as instructed.   For Heart failure patients - Check your Weight same time everyday, if you gain over 2 pounds, or you develop in leg swelling, experience more shortness of breath or chest pain, call your Primary MD immediately. Follow Cardiac Low Salt Diet and 1.5 lit/day fluid restriction.   On your next visit with your primary care physician please Get Medicines reviewed and adjusted.   Please request your Prim.MD to go over all Hospital Tests and Procedure/Radiological results at the follow up, please get all Hospital records sent to  your Prim MD by signing hospital release before you go home.   If you experience worsening of your admission symptoms, develop shortness of breath, life threatening emergency, suicidal or homicidal thoughts you must seek medical attention immediately by calling 911 or calling your MD immediately  if symptoms less severe.  You Must read complete instructions/literature along with all the possible adverse reactions/side effects for all the Medicines you take and that have been prescribed to you. Take any new Medicines after you have completely understood and accpet all the possible adverse reactions/side effects.   Do not drive, operating heavy  machinery, perform activities at heights, swimming or participation in water activities or provide baby sitting services if your were admitted for syncope or siezures until you have seen by Primary MD or a Neurologist and advised to do so again.  Do not drive when taking Pain medications.    Do not take more than prescribed Pain, Sleep and Anxiety Medications  Special Instructions: If you have smoked or chewed Tobacco  in the last 2 yrs please stop smoking, stop any regular Alcohol  and or any Recreational drug use.  Wear Seat belts while driving.   Please note  You were cared for by a hospitalist during your hospital stay. If you have any questions about your discharge medications or the care you received while you were in the hospital after you are discharged, you can call the unit and asked to speak with the hospitalist on call if the hospitalist that took care of you is not available. Once you are discharged, your primary care physician will handle any further medical issues. Please note that NO REFILLS for any discharge medications will be authorized once you are discharged, as it is imperative that you return to your primary care physician (or establish a relationship with a primary care physician if you do not have one) for your aftercare needs so  that they can reassess your need for medications and monitor your lab values.     Increase activity slowly    Complete by:  As directed              Discharge Medications       Medication List    TAKE these medications        ACCU-CHEK FASTCLIX LANCETS Misc  Check BS 4 times daily before meals and at bedtime     acetaminophen 500 MG tablet  Commonly known as:  TYLENOL  Take 1,000 mg by mouth every 6 (six) hours as needed for mild pain or headache.     feeding supplement (PRO-STAT SUGAR FREE 64) Liqd  Take 30 mLs by mouth 3 (three) times daily with meals.     glucose blood test strip  Commonly known as:  ACCU-CHEK SMARTVIEW  Use as instructed     insulin NPH-regular Human (70-30) 100 UNIT/ML injection  Commonly known as:  NOVOLIN 70/30  Inject 12 Units into the skin 2 (two) times daily with a meal.     Insulin Syringes (Disposable) U-100 1 ML Misc  10 Units by Does not apply route 2 (two) times daily.     lisinopril 10 MG tablet  Commonly known as:  PRINIVIL,ZESTRIL  Take 1 tablet (10 mg total) by mouth daily.     metoprolol tartrate 25 MG tablet  Commonly known as:  LOPRESSOR  Take 1 tablet (25 mg total) by mouth 2 (two) times daily.     potassium chloride SA 20 MEQ tablet  Commonly known as:  K-DUR,KLOR-CON  Take 1 tablet (20 mEq total) by mouth daily.     vancomycin 50 mg/mL oral solution  Commonly known as:  VANCOCIN  250 mg orally 4 times a day one week supply. Can dispense in liquid form. Dispense per pharmacy as appropriate.        Major procedures and Radiology Reports - PLEASE review detailed and final reports for all details, in brief -       Dg Chest 2 View  11/11/2015  CLINICAL DATA:  65 year old male with generalized weakness EXAM: CHEST  2 VIEW COMPARISON:  Chest radiograph dated 07/22/2015 FINDINGS: The heart size and mediastinal contours are within normal limits. Both lungs are clear. The visualized skeletal structures are unremarkable.  IMPRESSION: No active cardiopulmonary disease. Electronically Signed   By: Anner Crete M.D.   On: 11/11/2015 05:58    Micro Results      Recent Results (from the past 240 hour(s))  C difficile quick scan w PCR reflex     Status: Abnormal   Collection Time: 11/12/15  1:10 PM  Result Value Ref Range Status   C Diff antigen POSITIVE (A) NEGATIVE Final   C Diff toxin NEGATIVE NEGATIVE Final   C Diff interpretation   Final    C. difficile present, but toxin not detected. This indicates colonization. In most cases, this does not require treatment. If patient has signs and symptoms consistent with colitis, consider treatment. Requires ENTERIC precautions.       Today   Subjective    Brian Owen today has no headache,no chest abdominal pain,no new weakness tingling or numbness, feels much better wants to go home today.     Objective   Blood pressure 131/69, pulse 82, temperature 98.6 F (37 C), temperature source Oral, resp. rate 16, SpO2 98 %.   Intake/Output Summary (Last 24 hours) at 11/13/15 1034 Last data filed at 11/13/15 0934  Gross per 24 hour  Intake 1306.33 ml  Output    575 ml  Net 731.33 ml    Exam Awake Alert, Oriented x 3, No new F.N deficits, Normal affect [place].AT,PERRAL Supple Neck,No JVD, No cervical lymphadenopathy appriciated.  Symmetrical Chest wall movement, Good air movement bilaterally, CTAB RRR,No Gallops,Rubs or new Murmurs, No Parasternal Heave +ve B.Sounds, Abd Soft, Non tender, No organomegaly appriciated, No rebound -guarding or rigidity. No Cyanosis, Clubbing or edema, No new Rash or bruise, small skin tear and left thigh and left arm under bandage and looks fine. No infection.   Data Review   CBC w Diff: Lab Results  Component Value Date   WBC 11.3* 11/13/2015   WBC 5.5 08/10/2014   HGB 10.8* 11/13/2015   HCT 31.5* 11/13/2015   HCT 31 08/10/2014   PLT 190 11/13/2015   PLT 261 08/10/2014   LYMPHOPCT 6 11/11/2015   MONOPCT 8  11/11/2015   EOSPCT 0 11/11/2015   BASOPCT 0 11/11/2015    CMP: Lab Results  Component Value Date   NA 140 11/13/2015   K 3.3* 11/13/2015   CL 109 11/13/2015   CO2 24 11/13/2015   BUN 16 11/13/2015   BUN 17 08/10/2014   CREATININE 1.26* 11/13/2015   CREATININE 1.01 06/23/2015   PROT 6.2* 11/12/2015   ALBUMIN 2.4* 11/12/2015   BILITOT 0.7 11/12/2015   ALKPHOS 89 11/12/2015   AST 13* 11/12/2015   ALT 12* 11/12/2015  .  Lab Results  Component Value Date   TSH 0.302* 11/12/2015    Total Time in preparing paper work, data evaluation and todays exam - 35 minutes  Thurnell Lose M.D on 11/13/2015 at 10:34 AM  [facility]   Office  [phone]

## 2015-11-14 LAB — T3: T3 TOTAL: 48 ng/dL — AB (ref 71–180)

## 2015-11-15 ENCOUNTER — Other Ambulatory Visit: Payer: Self-pay | Admitting: Family Medicine

## 2015-11-15 DIAGNOSIS — R946 Abnormal results of thyroid function studies: Secondary | ICD-10-CM | POA: Insufficient documentation

## 2015-11-16 ENCOUNTER — Telehealth: Payer: Self-pay | Admitting: *Deleted

## 2015-11-16 NOTE — Telephone Encounter (Signed)
-----   Message from Boykin Nearing, MD sent at 11/15/2015  1:48 PM EST ----- T3 is low Endocrinology referral placed

## 2015-11-16 NOTE — Telephone Encounter (Signed)
Used pacific interpreter [place]# [id number]  LVM to return call

## 2015-11-17 DIAGNOSIS — I10 Essential (primary) hypertension: Secondary | ICD-10-CM | POA: Diagnosis not present

## 2015-11-17 DIAGNOSIS — S51802D Unspecified open wound of left forearm, subsequent encounter: Secondary | ICD-10-CM | POA: Diagnosis not present

## 2015-11-17 DIAGNOSIS — Z794 Long term (current) use of insulin: Secondary | ICD-10-CM | POA: Diagnosis not present

## 2015-11-17 DIAGNOSIS — Z87891 Personal history of nicotine dependence: Secondary | ICD-10-CM | POA: Diagnosis not present

## 2015-11-17 DIAGNOSIS — E43 Unspecified severe protein-calorie malnutrition: Secondary | ICD-10-CM | POA: Diagnosis not present

## 2015-11-17 DIAGNOSIS — E1165 Type 2 diabetes mellitus with hyperglycemia: Secondary | ICD-10-CM | POA: Diagnosis not present

## 2015-11-17 DIAGNOSIS — S71102D Unspecified open wound, left thigh, subsequent encounter: Secondary | ICD-10-CM | POA: Diagnosis not present

## 2015-11-18 DIAGNOSIS — I10 Essential (primary) hypertension: Secondary | ICD-10-CM | POA: Diagnosis not present

## 2015-11-18 DIAGNOSIS — S71102D Unspecified open wound, left thigh, subsequent encounter: Secondary | ICD-10-CM | POA: Diagnosis not present

## 2015-11-18 DIAGNOSIS — S51802D Unspecified open wound of left forearm, subsequent encounter: Secondary | ICD-10-CM | POA: Diagnosis not present

## 2015-11-18 DIAGNOSIS — Z87891 Personal history of nicotine dependence: Secondary | ICD-10-CM | POA: Diagnosis not present

## 2015-11-18 DIAGNOSIS — E43 Unspecified severe protein-calorie malnutrition: Secondary | ICD-10-CM | POA: Diagnosis not present

## 2015-11-18 DIAGNOSIS — E1165 Type 2 diabetes mellitus with hyperglycemia: Secondary | ICD-10-CM | POA: Diagnosis not present

## 2015-11-19 DIAGNOSIS — I10 Essential (primary) hypertension: Secondary | ICD-10-CM | POA: Diagnosis not present

## 2015-11-19 DIAGNOSIS — E1165 Type 2 diabetes mellitus with hyperglycemia: Secondary | ICD-10-CM | POA: Diagnosis not present

## 2015-11-19 DIAGNOSIS — S71102D Unspecified open wound, left thigh, subsequent encounter: Secondary | ICD-10-CM | POA: Diagnosis not present

## 2015-11-19 DIAGNOSIS — E43 Unspecified severe protein-calorie malnutrition: Secondary | ICD-10-CM | POA: Diagnosis not present

## 2015-11-19 DIAGNOSIS — Z87891 Personal history of nicotine dependence: Secondary | ICD-10-CM | POA: Diagnosis not present

## 2015-11-19 DIAGNOSIS — S51802D Unspecified open wound of left forearm, subsequent encounter: Secondary | ICD-10-CM | POA: Diagnosis not present

## 2015-11-21 DIAGNOSIS — E1165 Type 2 diabetes mellitus with hyperglycemia: Secondary | ICD-10-CM | POA: Diagnosis not present

## 2015-11-21 DIAGNOSIS — S51802D Unspecified open wound of left forearm, subsequent encounter: Secondary | ICD-10-CM | POA: Diagnosis not present

## 2015-11-21 DIAGNOSIS — E43 Unspecified severe protein-calorie malnutrition: Secondary | ICD-10-CM | POA: Diagnosis not present

## 2015-11-21 DIAGNOSIS — S71102D Unspecified open wound, left thigh, subsequent encounter: Secondary | ICD-10-CM | POA: Diagnosis not present

## 2015-11-21 DIAGNOSIS — I10 Essential (primary) hypertension: Secondary | ICD-10-CM | POA: Diagnosis not present

## 2015-11-21 DIAGNOSIS — Z87891 Personal history of nicotine dependence: Secondary | ICD-10-CM | POA: Diagnosis not present

## 2015-11-22 DIAGNOSIS — Z87891 Personal history of nicotine dependence: Secondary | ICD-10-CM | POA: Diagnosis not present

## 2015-11-22 DIAGNOSIS — I10 Essential (primary) hypertension: Secondary | ICD-10-CM | POA: Diagnosis not present

## 2015-11-22 DIAGNOSIS — S71102D Unspecified open wound, left thigh, subsequent encounter: Secondary | ICD-10-CM | POA: Diagnosis not present

## 2015-11-22 DIAGNOSIS — E43 Unspecified severe protein-calorie malnutrition: Secondary | ICD-10-CM | POA: Diagnosis not present

## 2015-11-22 DIAGNOSIS — S51802D Unspecified open wound of left forearm, subsequent encounter: Secondary | ICD-10-CM | POA: Diagnosis not present

## 2015-11-22 DIAGNOSIS — E1165 Type 2 diabetes mellitus with hyperglycemia: Secondary | ICD-10-CM | POA: Diagnosis not present

## 2015-11-23 DIAGNOSIS — Z87891 Personal history of nicotine dependence: Secondary | ICD-10-CM | POA: Diagnosis not present

## 2015-11-23 DIAGNOSIS — S71102D Unspecified open wound, left thigh, subsequent encounter: Secondary | ICD-10-CM | POA: Diagnosis not present

## 2015-11-23 DIAGNOSIS — E43 Unspecified severe protein-calorie malnutrition: Secondary | ICD-10-CM | POA: Diagnosis not present

## 2015-11-23 DIAGNOSIS — E1165 Type 2 diabetes mellitus with hyperglycemia: Secondary | ICD-10-CM | POA: Diagnosis not present

## 2015-11-23 DIAGNOSIS — I10 Essential (primary) hypertension: Secondary | ICD-10-CM | POA: Diagnosis not present

## 2015-11-23 DIAGNOSIS — S51802D Unspecified open wound of left forearm, subsequent encounter: Secondary | ICD-10-CM | POA: Diagnosis not present

## 2015-11-24 DIAGNOSIS — Z87891 Personal history of nicotine dependence: Secondary | ICD-10-CM | POA: Diagnosis not present

## 2015-11-24 DIAGNOSIS — I10 Essential (primary) hypertension: Secondary | ICD-10-CM | POA: Diagnosis not present

## 2015-11-24 DIAGNOSIS — S71102D Unspecified open wound, left thigh, subsequent encounter: Secondary | ICD-10-CM | POA: Diagnosis not present

## 2015-11-24 DIAGNOSIS — E43 Unspecified severe protein-calorie malnutrition: Secondary | ICD-10-CM | POA: Diagnosis not present

## 2015-11-24 DIAGNOSIS — E1165 Type 2 diabetes mellitus with hyperglycemia: Secondary | ICD-10-CM | POA: Diagnosis not present

## 2015-11-24 DIAGNOSIS — S51802D Unspecified open wound of left forearm, subsequent encounter: Secondary | ICD-10-CM | POA: Diagnosis not present

## 2015-11-25 DIAGNOSIS — I10 Essential (primary) hypertension: Secondary | ICD-10-CM | POA: Diagnosis not present

## 2015-11-25 DIAGNOSIS — E1165 Type 2 diabetes mellitus with hyperglycemia: Secondary | ICD-10-CM | POA: Diagnosis not present

## 2015-11-25 DIAGNOSIS — S71102D Unspecified open wound, left thigh, subsequent encounter: Secondary | ICD-10-CM | POA: Diagnosis not present

## 2015-11-25 DIAGNOSIS — E43 Unspecified severe protein-calorie malnutrition: Secondary | ICD-10-CM | POA: Diagnosis not present

## 2015-11-25 DIAGNOSIS — S51802D Unspecified open wound of left forearm, subsequent encounter: Secondary | ICD-10-CM | POA: Diagnosis not present

## 2015-11-25 DIAGNOSIS — Z87891 Personal history of nicotine dependence: Secondary | ICD-10-CM | POA: Diagnosis not present

## 2015-11-27 ENCOUNTER — Observation Stay (HOSPITAL_COMMUNITY): Payer: Medicare Other

## 2015-11-27 ENCOUNTER — Observation Stay (HOSPITAL_COMMUNITY)
Admission: EM | Admit: 2015-11-27 | Discharge: 2015-11-29 | Disposition: A | Discharge status: Home / Self Care | Payer: Medicare Other | Attending: Internal Medicine | Admitting: Internal Medicine

## 2015-11-27 ENCOUNTER — Encounter (HOSPITAL_COMMUNITY): Payer: Self-pay | Admitting: Emergency Medicine

## 2015-11-27 DIAGNOSIS — R238 Other skin changes: Secondary | ICD-10-CM

## 2015-11-27 DIAGNOSIS — D649 Anemia, unspecified: Secondary | ICD-10-CM | POA: Diagnosis not present

## 2015-11-27 DIAGNOSIS — R42 Dizziness and giddiness: Secondary | ICD-10-CM

## 2015-11-27 DIAGNOSIS — A047 Enterocolitis due to Clostridium difficile: Secondary | ICD-10-CM | POA: Insufficient documentation

## 2015-11-27 DIAGNOSIS — I951 Orthostatic hypotension: Secondary | ICD-10-CM | POA: Diagnosis not present

## 2015-11-27 DIAGNOSIS — L98499 Non-pressure chronic ulcer of skin of other sites with unspecified severity: Secondary | ICD-10-CM | POA: Diagnosis not present

## 2015-11-27 DIAGNOSIS — E0781 Sick-euthyroid syndrome: Secondary | ICD-10-CM | POA: Insufficient documentation

## 2015-11-27 DIAGNOSIS — R05 Cough: Secondary | ICD-10-CM

## 2015-11-27 DIAGNOSIS — R739 Hyperglycemia, unspecified: Secondary | ICD-10-CM | POA: Diagnosis not present

## 2015-11-27 DIAGNOSIS — E1143 Type 2 diabetes mellitus with diabetic autonomic (poly)neuropathy: Principal | ICD-10-CM | POA: Insufficient documentation

## 2015-11-27 DIAGNOSIS — I959 Hypotension, unspecified: Secondary | ICD-10-CM

## 2015-11-27 DIAGNOSIS — L97929 Non-pressure chronic ulcer of unspecified part of left lower leg with unspecified severity: Secondary | ICD-10-CM | POA: Diagnosis not present

## 2015-11-27 DIAGNOSIS — Z794 Long term (current) use of insulin: Secondary | ICD-10-CM | POA: Diagnosis present

## 2015-11-27 DIAGNOSIS — E1100 Type 2 diabetes mellitus with hyperosmolarity without nonketotic hyperglycemic-hyperosmolar coma (NKHHC): Secondary | ICD-10-CM | POA: Diagnosis not present

## 2015-11-27 DIAGNOSIS — I10 Essential (primary) hypertension: Secondary | ICD-10-CM | POA: Diagnosis present

## 2015-11-27 DIAGNOSIS — A0472 Enterocolitis due to Clostridium difficile, not specified as recurrent: Secondary | ICD-10-CM

## 2015-11-27 DIAGNOSIS — R946 Abnormal results of thyroid function studies: Secondary | ICD-10-CM | POA: Diagnosis present

## 2015-11-27 DIAGNOSIS — R059 Cough, unspecified: Secondary | ICD-10-CM

## 2015-11-27 DIAGNOSIS — Z789 Other specified health status: Secondary | ICD-10-CM | POA: Diagnosis present

## 2015-11-27 DIAGNOSIS — Z87891 Personal history of nicotine dependence: Secondary | ICD-10-CM | POA: Insufficient documentation

## 2015-11-27 DIAGNOSIS — E43 Unspecified severe protein-calorie malnutrition: Secondary | ICD-10-CM | POA: Diagnosis present

## 2015-11-27 DIAGNOSIS — E1165 Type 2 diabetes mellitus with hyperglycemia: Secondary | ICD-10-CM | POA: Insufficient documentation

## 2015-11-27 DIAGNOSIS — E1343 Other specified diabetes mellitus with diabetic autonomic (poly)neuropathy: Secondary | ICD-10-CM

## 2015-11-27 DIAGNOSIS — R531 Weakness: Secondary | ICD-10-CM | POA: Diagnosis not present

## 2015-11-27 LAB — GLUCOSE, CAPILLARY
GLUCOSE-CAPILLARY: 373 mg/dL — AB (ref 65–99)
GLUCOSE-CAPILLARY: 276 mg/dL — AB (ref 65–99)
Glucose-Capillary: 280 mg/dL — ABNORMAL HIGH (ref 65–99)
Glucose-Capillary: 109 mg/dL — ABNORMAL HIGH (ref 65–99)
Glucose-Capillary: 377 mg/dL — ABNORMAL HIGH (ref 65–99)

## 2015-11-27 LAB — BASIC METABOLIC PANEL
ANION GAP: 8 (ref 5–15)
BUN: 13 mg/dL (ref 6–20)
CHLORIDE: 93 mmol/L — AB (ref 101–111)
CO2: 30 mmol/L (ref 22–32)
Calcium: 9 mg/dL (ref 8.9–10.3)
Creatinine, Ser: 1.18 mg/dL (ref 0.61–1.24)
GFR calc Af Amer: >60 mL/min (ref >60)
GFR calc non Af Amer: >60 mL/min (ref >60)
GLUCOSE: 419 mg/dL — AB (ref 65–99)
POTASSIUM: 4.3 mmol/L (ref 3.5–5.1)
Sodium: 131 mmol/L — ABNORMAL LOW (ref 135–145)

## 2015-11-27 LAB — CBC WITH DIFFERENTIAL/PLATELET
BASOS ABS: 0 10*3/uL (ref 0.0–0.1)
Basophils Relative: 0 %
Eosinophils Absolute: 0.1 10*3/uL (ref 0.0–0.7)
Eosinophils Relative: 1 %
HEMATOCRIT: 34.6 % — AB (ref 39.0–52.0)
Hemoglobin: 11.5 g/dL — ABNORMAL LOW (ref 13.0–17.0)
LYMPHS PCT: 19 %
Lymphs Abs: 2 10*3/uL (ref 0.7–4.0)
MCH: 29.5 pg (ref 26.0–34.0)
MCHC: 33.2 g/dL (ref 30.0–36.0)
MCV: 88.7 fL (ref 78.0–100.0)
MONO ABS: 0.5 10*3/uL (ref 0.1–1.0)
Monocytes Relative: 5 %
NEUTROS ABS: 7.8 10*3/uL — AB (ref 1.7–7.7)
Neutrophils Relative %: 75 %
Platelets: 250 10*3/uL (ref 150–400)
RBC: 3.9 MIL/uL — AB (ref 4.22–5.81)
RDW: 12.9 % (ref 11.5–15.5)
WBC: 10.5 10*3/uL (ref 4.0–10.5)

## 2015-11-27 LAB — URINE MICROSCOPIC-ADD ON

## 2015-11-27 LAB — TSH: TSH: 1.168 u[IU]/mL (ref 0.350–4.500)

## 2015-11-27 LAB — CBG MONITORING, ED
GLUCOSE-CAPILLARY: 340 mg/dL — AB (ref 65–99)
Glucose-Capillary: 389 mg/dL — ABNORMAL HIGH (ref 65–99)

## 2015-11-27 LAB — URINALYSIS, ROUTINE W REFLEX MICROSCOPIC
BILIRUBIN URINE: NEGATIVE
Ketones, ur: NEGATIVE mg/dL
Nitrite: NEGATIVE
PH: 6.5 (ref 5.0–8.0)
Protein, ur: NEGATIVE mg/dL
SPECIFIC GRAVITY, URINE: 1.012 (ref 1.005–1.030)

## 2015-11-27 LAB — T4, FREE: Free T4: 1.04 ng/dL (ref 0.61–1.12)

## 2015-11-27 MED ORDER — ENOXAPARIN SODIUM 40 MG/0.4ML ~~LOC~~ SOLN
40.0000 mg | SUBCUTANEOUS | Status: DC
  Administered 2015-11-27 – 2015-11-28 (×2): 40 mg via SUBCUTANEOUS
  Filled 2015-11-27 (×2): qty 0.4

## 2015-11-27 MED ORDER — ONDANSETRON HCL 4 MG PO TABS: 4.0000 mg | ORAL_TABLET | Freq: Four times a day (QID) | ORAL | Status: DC | PRN

## 2015-11-27 MED ORDER — SODIUM CHLORIDE 0.9 % IV BOLUS (SEPSIS)
1000.0000 mL | Freq: Once | INTRAVENOUS | Status: AC
  Administered 2015-11-27: 1000 mL via INTRAVENOUS

## 2015-11-27 MED ORDER — ASPIRIN 81 MG PO CHEW: 81.0000 mg | CHEWABLE_TABLET | Freq: Every day | ORAL | Status: DC

## 2015-11-27 MED ORDER — INSULIN ASPART 100 UNIT/ML ~~LOC~~ SOLN
0.0000 [IU] | Freq: Three times a day (TID) | SUBCUTANEOUS | Status: DC
  Administered 2015-11-27: 8 [IU] via SUBCUTANEOUS

## 2015-11-27 MED ORDER — INSULIN GLARGINE 100 UNIT/ML ~~LOC~~ SOLN
16.0000 [IU] | Freq: Every day | SUBCUTANEOUS | Status: DC
  Administered 2015-11-27: 16 [IU] via SUBCUTANEOUS
  Filled 2015-11-27: qty 0.16

## 2015-11-27 MED ORDER — ASPIRIN EC 325 MG PO TBEC
325.0000 mg | DELAYED_RELEASE_TABLET | Freq: Every day | ORAL | Status: DC
  Administered 2015-11-27 – 2015-11-28 (×2): 325 mg via ORAL
  Filled 2015-11-27 (×2): qty 1

## 2015-11-27 MED ORDER — STROKE: EARLY STAGES OF RECOVERY BOOK
Freq: Once | Status: DC
  Filled 2015-11-27: qty 1

## 2015-11-27 MED ORDER — CETYLPYRIDINIUM CHLORIDE 0.05 % MT LIQD
7.0000 mL | Freq: Two times a day (BID) | OROMUCOSAL | Status: DC
  Administered 2015-11-27 – 2015-11-29 (×4): 7 mL via OROMUCOSAL

## 2015-11-27 MED ORDER — PRO-STAT SUGAR FREE PO LIQD
30.0000 mL | Freq: Three times a day (TID) | ORAL | Status: DC
  Administered 2015-11-27 – 2015-11-28 (×3): 30 mL via ORAL
  Filled 2015-11-27 (×3): qty 30

## 2015-11-27 MED ORDER — METOPROLOL TARTRATE 25 MG PO TABS
25.0000 mg | ORAL_TABLET | Freq: Two times a day (BID) | ORAL | Status: DC
  Administered 2015-11-27: 25 mg via ORAL
  Filled 2015-11-27 (×2): qty 1

## 2015-11-27 MED ORDER — HYDROCODONE-ACETAMINOPHEN 5-325 MG PO TABS: 1.0000 | ORAL_TABLET | ORAL | Status: DC | PRN

## 2015-11-27 MED ORDER — INSULIN ASPART 100 UNIT/ML ~~LOC~~ SOLN
10.0000 [IU] | Freq: Once | SUBCUTANEOUS | Status: AC
  Administered 2015-11-27: 10 [IU] via SUBCUTANEOUS
  Filled 2015-11-27: qty 1

## 2015-11-27 MED ORDER — SODIUM CHLORIDE 0.9 % IV SOLN
INTRAVENOUS | Status: DC
  Administered 2015-11-27 (×2): via INTRAVENOUS
  Administered 2015-11-28: 75 mL/h via INTRAVENOUS

## 2015-11-27 MED ORDER — ACETAMINOPHEN 650 MG RE SUPP: 650.0000 mg | Freq: Four times a day (QID) | RECTAL | Status: DC | PRN

## 2015-11-27 MED ORDER — MECLIZINE HCL 25 MG PO TABS
25.0000 mg | ORAL_TABLET | Freq: Once | ORAL | Status: AC
  Administered 2015-11-27: 25 mg via ORAL
  Filled 2015-11-27: qty 1

## 2015-11-27 MED ORDER — ACETAMINOPHEN 325 MG PO TABS: 650.0000 mg | ORAL_TABLET | Freq: Four times a day (QID) | ORAL | Status: DC | PRN

## 2015-11-27 MED ORDER — MECLIZINE HCL 12.5 MG PO TABS: 12.5000 mg | ORAL_TABLET | Freq: Two times a day (BID) | ORAL | Status: DC | PRN

## 2015-11-27 MED ORDER — ONDANSETRON HCL 4 MG/2ML IJ SOLN: 4.0000 mg | Freq: Four times a day (QID) | INTRAMUSCULAR | Status: DC | PRN

## 2015-11-27 MED ORDER — INSULIN ASPART 100 UNIT/ML ~~LOC~~ SOLN
3.0000 [IU] | Freq: Three times a day (TID) | SUBCUTANEOUS | Status: DC
  Administered 2015-11-28 (×3): 3 [IU] via SUBCUTANEOUS

## 2015-11-27 NOTE — ED Provider Notes (Signed)
CSN: [id number]     Arrival date & time 11/27/15  0407 History   First MD Initiated Contact with Patient 11/27/15 [phone]     Chief Complaint  Patient presents with  . Hyperglycemia     (Consider location/radiation/quality/duration/timing/severity/associated sxs/prior Treatment) HPI  This is a 65 year old male with history of diabetes, noncompliant with insulin who presents with hyperglycemia.  Per patient and his daughter, patient has had blood sugars in the 300s. He woke up this morning and felt dizzy. Dizziness was worse with standing. He is unable to identify whether it is room spinning dizziness. Denies any lower extremity weakness, numbness, tingling. Denies any headache. Has never had anything like this before. Daughter reports that she has been giving his insulin to him but "[facility]." They deny any recent illnesses, fevers.  Past Medical History  Diagnosis Date  . Heavy cigarette smoker   . Bullae 11/11/2015    legs/notes 11/11/2015  . Uncontrolled type II diabetes mellitus ([place]) dx'd 2013   Past Surgical History  Procedure Laterality Date  . No past surgeries     No family history on file. Social History  Substance Use Topics  . Smoking status: Former Smoker -- 0.25 packs/day for 48 years    Types: Cigarettes    Quit date: 04/28/2014  . Smokeless tobacco: Never Used  . Alcohol Use: No    Review of Systems  Constitutional: Negative.  Negative for fever.  Respiratory: Negative.  Negative for chest tightness and shortness of breath.   Cardiovascular: Negative.  Negative for chest pain.  Gastrointestinal: Negative.  Negative for nausea, vomiting and abdominal pain.  Genitourinary: Negative.   Neurological: Positive for dizziness. Negative for syncope and headaches.  All other systems reviewed and are negative.     Allergies  Review of patient's allergies indicates no known allergies.  Home Medications   Prior to Admission medications    Medication Sig Start Date End Date Taking? Authorizing Provider  insulin NPH-regular Human (NOVOLIN 70/30) (70-30) 100 UNIT/ML injection Inject 12 Units into the skin 2 (two) times daily with a meal. 11/13/15  Yes Thurnell Lose, MD  metoprolol tartrate (LOPRESSOR) 25 MG tablet Take 1 tablet (25 mg total) by mouth 2 (two) times daily. 11/13/15  Yes Thurnell Lose, MD  ACCU-CHEK FASTCLIX LANCETS MISC Check BS 4 times daily before meals and at bedtime Patient not taking: Reported on 11/11/2015 07/01/15   Brayton Caves, PA-C  Amino Acids-Protein Hydrolys (FEEDING SUPPLEMENT, PRO-STAT SUGAR FREE 64,) LIQD Take 30 mLs by mouth 3 (three) times daily with meals. Patient not taking: Reported on 11/27/2015 11/13/15   Thurnell Lose, MD  glucose blood (ACCU-CHEK SMARTVIEW) test strip Use as instructed Patient not taking: Reported on 11/11/2015 07/01/15   Brayton Caves, PA-C  Insulin Syringes, Disposable, U-100 1 ML MISC 10 Units by Does not apply route 2 (two) times daily. Patient not taking: Reported on 07/25/2015 06/23/15   Brayton Caves, PA-C  lisinopril (PRINIVIL,ZESTRIL) 10 MG tablet Take 1 tablet (10 mg total) by mouth daily. Patient not taking: Reported on 11/11/2015 07/25/15   Boykin Nearing, MD  potassium chloride SA (K-DUR,KLOR-CON) 20 MEQ tablet Take 1 tablet (20 mEq total) by mouth daily. Patient not taking: Reported on 11/11/2015 06/29/15   Brayton Caves, PA-C  vancomycin (VANCOCIN) 50 mg/mL oral solution 250 mg orally 4 times a day one week supply. Can dispense in liquid form. Dispense per pharmacy as appropriate. 11/13/15   Prashant  Jennette Kettle, MD   BP 136/81 mmHg  Pulse 78  Temp(Src) 98.1 F (36.7 C) (Oral)  Resp 18  SpO2 98% Physical Exam  Constitutional: He is oriented to person, place, and time.  Thin, frail  HENT:  Head: Normocephalic and atraumatic.  Mucous membranes dry  Eyes: EOM are normal.  No nystagmus was noted  Cardiovascular: Normal rate, regular rhythm and normal heart  sounds.   No murmur heard. Pulmonary/Chest: Effort normal and breath sounds normal. No respiratory distress. He has no wheezes.  Abdominal: Soft. Bowel sounds are normal. There is no tenderness. There is no rebound and no guarding.  Musculoskeletal: He exhibits no edema.  Neurological: He is alert and oriented to person, place, and time.  Cranial nerves II through XII intact, no dysmetria to finger-nose-finger, no drift noted, moves all 4 extremities with normal strength  Skin: Skin is warm and dry.  Chronic ulcerative lesion 4 x 2 cm over the dorsum of the left forearm, no adjacent erythema, dry and crusted, additional ulcerative lesion noted over the left thigh, again appears dry and crusted, no adjacent erythema  Psychiatric: He has a normal mood and affect.  Nursing note and vitals reviewed.   ED Course  Procedures (including critical care time) Labs Review Labs Reviewed  CBC WITH DIFFERENTIAL/PLATELET - Abnormal; Notable for the following:    RBC 3.90 (*)    Hemoglobin 11.5 (*)    HCT 34.6 (*)    Neutro Abs 7.8 (*)    All other components within normal limits  BASIC METABOLIC PANEL - Abnormal; Notable for the following:    Sodium 131 (*)    Chloride 93 (*)    Glucose, Bld 419 (*)    All other components within normal limits  URINALYSIS, ROUTINE W REFLEX MICROSCOPIC (NOT AT [facility]) - Abnormal; Notable for the following:    APPearance CLOUDY (*)    Glucose, UA >1000 (*)    Hgb urine dipstick MODERATE (*)    Leukocytes, UA SMALL (*)    All other components within normal limits  URINE MICROSCOPIC-ADD ON - Abnormal; Notable for the following:    Squamous Epithelial / LPF 0-5 (*)    Bacteria, UA FEW (*)    Casts HYALINE CASTS (*)    All other components within normal limits  CBG MONITORING, ED - Abnormal; Notable for the following:    Glucose-Capillary 389 (*)    All other components within normal limits  CBG MONITORING, ED - Abnormal; Notable for the following:     Glucose-Capillary 340 (*)    All other components within normal limits    Imaging Review No results found. I have personally reviewed and evaluated these images and lab results as part of my medical decision-making.   EKG Interpretation   Date/Time:  Sunday November 27 2015 04:44:13 EST Ventricular Rate:  79 PR Interval:  130 QRS Duration: 98 QT Interval:  404 QTC Calculation: 463 R Axis:   73 Text Interpretation:  Sinus rhythm Consider right atrial enlargement Low  voltage, precordial leads Nonspecific T abnormalities, lateral leads  Baseline wander in lead(s) I II aVR Confirmed by Keisa Blow  MD, Britney Captain  [id number]) on 11/27/2015 6:12:22 AM      MDM   Final diagnoses:  Hyperglycemia  Orthostasis    Patient presents with hyperglycemia. Also reports dizziness.  Nonfocal on exam. Initial glucose 389. Patient has no focal deficits and no cerebellar signs. He has however profoundly orthostatic with dropping his blood pressure upon standing.  He was given 2 L of fluid. He was also given 1 dose of insulin. EKG is nonischemic and without evidence of arrhythmia.  Sodium is 131. Patient appears dry and suspect dehydration as a component of his orthostasis.  After 2 L of fluids, repeat orthostatics continue to be positive. Patient does state that he feels better but he continues to drop his pressure into the 80s. Will admit for observation for glucose control and IV fluids.  Discussed with [facility] hospitalist PA.    Merryl Hacker, MD 11/27/15 0830

## 2015-11-27 NOTE — ED Notes (Signed)
CHECKED CBG 389, INFORMED RN CALLIE

## 2015-11-27 NOTE — ED Notes (Signed)
Per EMS, pt from home with c/o hyperglycemia. Blood sugar 409, BP 180/100. Pt also has two wounds, LLE and LUE. Wounds have been present since Christmas

## 2015-11-27 NOTE — H&P (Signed)
[facility] History and Physical  Brian Owen [id number] DOB: Jun 27, 1951 DOA: 11/27/2015  Referring physician: Emergency Department PCP: [name], MD   CHIEF COMPLAINT:                   HPI: Brian Owen is a 65 y.o. male who was hospitalized here two weeks ago with uncontrolled blood sugars, dehydration and ARF. Daughter check patient's CBGs and gives insulin BID. Patient speaks limited [place], daughter helps with history. Patient "doesn't eat sugar". Blood sugars consistently run 300-400 at home.   This am patient complained of dizziness and weakness when getting out of bed. She states the dizziness started this am. Patient endorses headache (mid forehead) when glucose is elevated though headache new over last several days and blood sugars have been uncontrolled for much longer. He endorses blurry vision this am. He also endorses polyuria.   ED COURSE:           Labs:   Glucose 419, sodium 131, potassium 4.3, chloride 93, BUN 13, creatinine 1.18. White count normal at 10 point 5, hemoglobin 11 point 5  Anion gap 8  Urinalysis:    Cloudy, hyaline cast, greater than 1000 glucose, negative ketones, small leukocytes, negative nitrites                EKG:    Sinus rhythm Consider right atrial enlargement Low voltage, precordial leads Nonspecific T abnormalities, lateral leads Baseline wander in lead(s) I II aVR QTC 463Confirmed by HORTON MD, COURTNEY ([id number]) on 11/27/2015 6:12                  Medications  sodium chloride 0.9 % bolus 1,000 mL (0 mLs Intravenous Stopped 11/27/15 0559)  meclizine (ANTIVERT) tablet 25 mg (25 mg Oral Given 11/27/15 0527)  sodium chloride 0.9 % bolus 1,000 mL (0 mLs Intravenous Stopped 11/27/15 0700)  insulin aspart (novoLOG) injection 10 Units (10 Units Subcutaneous Given 11/27/15 0731)   Review of Systems  Eyes: Positive for blurred vision.  Respiratory: Negative.   Cardiovascular: Negative.   Gastrointestinal: Negative.     Genitourinary: Negative.   Musculoskeletal: Negative.   Skin: Negative.   Neurological: Positive for dizziness, weakness and headaches.  Endo/Heme/Allergies: Negative.     Past Medical History  Diagnosis Date  . Heavy cigarette smoker   . Bullae 11/11/2015    legs/notes 11/11/2015  . Uncontrolled type II diabetes mellitus ([place]) dx'd 2013   Past Surgical History  Procedure Laterality Date  . No past surgeries      SOCIAL HISTORY:  reports that he quit smoking about 19 months ago. His smoking use included Cigarettes. He has a 12 pack-year smoking history. He has never used smokeless tobacco. He reports that he does not drink alcohol or use illicit drugs. Lives: at home with family    Assistive devices:   Has a cane, walker and wheelchair.    No Known Allergies  Family History  Problem Relation Age of Onset  . Diabetes      Prior to Admission medications   Medication Sig Start Date End Date Taking? Authorizing Provider  insulin NPH-regular Human (NOVOLIN 70/30) (70-30) 100 UNIT/ML injection Inject 12 Units into the skin 2 (two) times daily with a meal. 11/13/15  Yes Thurnell Lose, MD  metoprolol tartrate (LOPRESSOR) 25 MG tablet Take 1 tablet (25 mg total) by mouth 2 (two) times daily. 11/13/15  Yes Thurnell Lose, MD  ACCU-CHEK FASTCLIX LANCETS MISC Check BS 4 times  daily before meals and at bedtime Patient not taking: Reported on 11/11/2015 07/01/15   Brayton Caves, PA-C  Amino Acids-Protein Hydrolys (FEEDING SUPPLEMENT, PRO-STAT SUGAR FREE 64,) LIQD Take 30 mLs by mouth 3 (three) times daily with meals. Patient not taking: Reported on 11/27/2015 11/13/15   Thurnell Lose, MD  glucose blood (ACCU-CHEK SMARTVIEW) test strip Use as instructed Patient not taking: Reported on 11/11/2015 07/01/15   Brayton Caves, PA-C  Insulin Syringes, Disposable, U-100 1 ML MISC 10 Units by Does not apply route 2 (two) times daily. Patient not taking: Reported on 07/25/2015 06/23/15   Brayton Caves,  PA-C  lisinopril (PRINIVIL,ZESTRIL) 10 MG tablet Take 1 tablet (10 mg total) by mouth daily. Patient not taking: Reported on 11/11/2015 07/25/15   Boykin Nearing, MD  potassium chloride SA (K-DUR,KLOR-CON) 20 MEQ tablet Take 1 tablet (20 mEq total) by mouth daily. Patient not taking: Reported on 11/11/2015 06/29/15   Brayton Caves, PA-C  vancomycin (VANCOCIN) 50 mg/mL oral solution 250 mg orally 4 times a day one week supply. Can dispense in liquid form. Dispense per pharmacy as appropriate. 11/13/15   Thurnell Lose, MD   PHYSICAL EXAM: Filed Vitals:   11/27/15 0630 11/27/15 0700 11/27/15 0730 11/27/15 0800  BP: 183/94 172/93 161/97 136/81  Pulse: 78 78 77 78  Temp:      TempSrc:      Resp: 13 16 18 18   SpO2: 99% 99% 98% 98%    Wt Readings from Last 3 Encounters:  07/25/15 51.71 kg (114 lb)  07/23/15 50.7 kg (111 lb 12.4 oz)  06/23/15 50.894 kg (112 lb 3.2 oz)    General:  Pleasant, malnourished appearing Asian male  Appears calm and comfortable Eyes: PER, normal lids, irises & conjunctiva ENT: grossly normal hearing, lips & tongue Neck: no LAD, no masses Cardiovascular: RRR, no murmurs. No LE edema.  Respiratory: Respirations even and unlabored. Normal respiratory effort. Lungs CTA bilaterally, no wheezes / rales .   Abdomen: soft, non-distended, non-tender, active bowel sounds. No obvious masses.  Skin: no rash seen on limited exam Musculoskeletal: grossly normal tone BUE/BLE Psychiatric: grossly normal mood and affect, speech fluent and appropriate Neurologic: grossly non-focal.         LABS ON ADMISSION:    Basic Metabolic Panel:  Recent Labs Lab 11/27/15 0442  NA 131*  K 4.3  CL 93*  CO2 30  GLUCOSE 419*  BUN 13  CREATININE 1.18  CALCIUM 9.0   CBC:  Recent Labs Lab 11/27/15 0442  WBC 10.5  NEUTROABS 7.8*  HGB 11.5*  HCT 34.6*  MCV 88.7  PLT 250    CBG:  Recent Labs Lab 11/27/15 0417 11/27/15 0700  GLUCAP 389* 340*    Creatinine clearance  cannot be calculated (Unknown ideal weight.)  ASSESSMENT / PLAN   Orthostatic hypotension, possibly secondary to volume depletion from hyperglycemia.  -Admit to observation- medical bed -Continue IV fluids. He has received 2 L in the ED. -Hold home ACEI  for now (assume he takes for renal protection given diabetes)  -repeat orthostatic vitals Q shift. -PT evaluation  Diabetes mellitus type 2, uncontrolled. Blood sugar 419. Normal anion gap. Hgb A1c 12 earlier this month.  CBGs 300-400 at home on 12 units of 70/30 BID. -Stop 70/30 for now. Begin bedtime Lantus, short acting insulin before meals in addition to a moderate SSI. Will likely need an increase but will start here and reevaluate tomorrow.  -diabetes coordinator evaluation for  home diet compliance, insulin compliance -Change diet to a car modified/heart healthy diet  Vertigo.  -Continue meclizine -Vestibular PT evaluation -MRI head to rule out CVA -stroke swallow screen -Start ASA  Protein calorie malnutrition. Recent albumin 2.4.  -continue nutritional supplements -Nutritional evaluation.   Normocytic anemia, chronic. Stable.   Abnormal thyroid function (mild) 11/13/15. TSH low at 0.32,  Free t4 elevated at 1.17, T3 low at 48.  -recheck thyroid studies.   Chronic diarrhea. C-diff ag + during recent admission. Toxin was negative so test indeterminate though diarrhea improved with vancomycin. Patient was sent home with an additional week of oral vancomycin which he did not get filled pharmacy. Suspect colonization rather than C. difficile colitis. Will hold on any further treatment for now.  Language barrier. From [place]. . Daughter helps with interpretation/translation.  CONSULTANTS:    None  Code Status: full code DVT Prophylaxis: Lovenox  Family Communication:  Patient alert, oriented and understands plan of care.  Disposition Plan: Discharge to home in 24-48 hours   Time spent: 60 minutes Tye Savoy   NP [facility] Pager [phone]

## 2015-11-27 NOTE — ED Notes (Signed)
RN Tori informed of results of orthostatic vitals

## 2015-11-27 NOTE — ED Notes (Signed)
Repeat orthostatics done per Dr. Eliezer Bottom request. Pt tolerated well, however did have some dizziness upon standing.

## 2015-11-27 NOTE — ED Notes (Signed)
CBG 373 

## 2015-11-28 ENCOUNTER — Ambulatory Visit: Payer: Medicare Other | Admitting: Family Medicine

## 2015-11-28 DIAGNOSIS — R946 Abnormal results of thyroid function studies: Secondary | ICD-10-CM

## 2015-11-28 DIAGNOSIS — I10 Essential (primary) hypertension: Secondary | ICD-10-CM | POA: Diagnosis not present

## 2015-11-28 DIAGNOSIS — E1100 Type 2 diabetes mellitus with hyperosmolarity without nonketotic hyperglycemic-hyperosmolar coma (NKHHC): Secondary | ICD-10-CM | POA: Diagnosis not present

## 2015-11-28 LAB — CBC
HCT: 32 % — ABNORMAL LOW (ref 39.0–52.0)
HEMOGLOBIN: 10.8 g/dL — AB (ref 13.0–17.0)
MCH: 30.2 pg (ref 26.0–34.0)
MCHC: 33.8 g/dL (ref 30.0–36.0)
MCV: 89.4 fL (ref 78.0–100.0)
Platelets: 251 10*3/uL (ref 150–400)
RBC: 3.58 MIL/uL — AB (ref 4.22–5.81)
RDW: 13 % (ref 11.5–15.5)
WBC: 8.7 10*3/uL (ref 4.0–10.5)

## 2015-11-28 LAB — GLUCOSE, CAPILLARY
GLUCOSE-CAPILLARY: 256 mg/dL — AB (ref 65–99)
GLUCOSE-CAPILLARY: 165 mg/dL — AB (ref 65–99)
Glucose-Capillary: 154 mg/dL — ABNORMAL HIGH (ref 65–99)
Glucose-Capillary: 97 mg/dL (ref 65–99)

## 2015-11-28 LAB — BASIC METABOLIC PANEL
ANION GAP: 8 (ref 5–15)
BUN: 8 mg/dL (ref 6–20)
CHLORIDE: 100 mmol/L — AB (ref 101–111)
CO2: 29 mmol/L (ref 22–32)
Calcium: 9 mg/dL (ref 8.9–10.3)
Creatinine, Ser: 1.09 mg/dL (ref 0.61–1.24)
GFR calc non Af Amer: >60 mL/min (ref >60)
Glucose, Bld: 310 mg/dL — ABNORMAL HIGH (ref 65–99)
POTASSIUM: 4.1 mmol/L (ref 3.5–5.1)
Sodium: 137 mmol/L (ref 135–145)

## 2015-11-28 MED ORDER — VANCOMYCIN 50 MG/ML ORAL SOLUTION
125.0000 mg | Freq: Four times a day (QID) | ORAL | Status: DC
  Administered 2015-11-28 – 2015-11-29 (×3): 125 mg via ORAL
  Filled 2015-11-28 (×6): qty 2.5

## 2015-11-28 MED ORDER — PRO-STAT SUGAR FREE PO LIQD
30.0000 mL | Freq: Two times a day (BID) | ORAL | Status: DC
  Administered 2015-11-29: 30 mL via ORAL
  Filled 2015-11-28: qty 30

## 2015-11-28 MED ORDER — GLUCERNA SHAKE PO LIQD
237.0000 mL | Freq: Two times a day (BID) | ORAL | Status: DC
  Administered 2015-11-28: 237 mL via ORAL
  Filled 2015-11-28 (×4): qty 237

## 2015-11-28 MED ORDER — LIVING WELL WITH DIABETES BOOK - IN SPANISH
Freq: Once | Status: DC
  Administered 2015-11-28: 10:00:00
  Filled 2015-11-28: qty 1

## 2015-11-28 MED ORDER — INSULIN ASPART 100 UNIT/ML ~~LOC~~ SOLN
0.0000 [IU] | Freq: Three times a day (TID) | SUBCUTANEOUS | Status: DC
  Administered 2015-11-28 (×2): 2 [IU] via SUBCUTANEOUS
  Administered 2015-11-28: 5 [IU] via SUBCUTANEOUS

## 2015-11-28 MED ORDER — INSULIN GLARGINE 100 UNIT/ML ~~LOC~~ SOLN
20.0000 [IU] | Freq: Every day | SUBCUTANEOUS | Status: DC
  Administered 2015-11-28: 20 [IU] via SUBCUTANEOUS
  Filled 2015-11-28 (×2): qty 0.2

## 2015-11-28 MED ORDER — ASPIRIN EC 81 MG PO TBEC
81.0000 mg | DELAYED_RELEASE_TABLET | Freq: Every day | ORAL | Status: DC
  Administered 2015-11-29: 81 mg via ORAL
  Filled 2015-11-28: qty 1

## 2015-11-28 MED ORDER — ADULT MULTIVITAMIN W/MINERALS CH
1.0000 | ORAL_TABLET | Freq: Every day | ORAL | Status: DC
  Administered 2015-11-28 – 2015-11-29 (×2): 1 via ORAL
  Filled 2015-11-28 (×2): qty 1

## 2015-11-28 NOTE — Evaluation (Signed)
Physical Therapy Evaluation Patient Details Name: Brian Owen MRN: [id number] DOB: 06/24/1951 Today's Date: 11/28/2015   History of Present Illness  The patient is a 65 year old male with history of recent hospitalization for uncontrolled diabetes and dehydration with acute kidney injury 2 weeks prior. Since discharge from the hospital he had ongoing weakness and gait instability for which he was working with physical therapy. This morning at 2 AM he had sudden onset of dizziness and diffuse weakness, slumping to one side when trying to stand up.Found to have high blood sugar and profound orthostasis.  Clinical Impression  Patient presents with decreased mobility,but currently better than previous to admission per pt.  He presents with deficits as noted in PT problem list below.  Feel patient will benefit from and will benefit from skilled PT in the acute setting to allow return home with family support and to resume HHPT at d/c.     Follow Up Recommendations Home health PT;Supervision/Assistance - 24 hour (HHPT to resume)    Equipment Recommendations  None recommended by PT    Recommendations for Other Services       Precautions / Restrictions Precautions Precautions: Fall      Mobility  Bed Mobility Overal bed mobility: Modified Independent                Transfers Overall transfer level: Needs assistance Equipment used: Rolling walker (2 wheeled)   Sit to Stand: Supervision            Ambulation/Gait Ambulation/Gait assistance: Min guard Ambulation Distance (Feet): 200 Feet Assistive device: Rolling walker (2 wheeled) Gait Pattern/deviations: Step-through pattern;Trunk flexed;Decreased stride length;Wide base of support     General Gait Details: demonstrated good stability with walker with hallway ambulation even with head turns to look into rooms  Stairs            Wheelchair Mobility    Modified Rankin (Stroke Patients Only)       Balance      Sitting balance-Leahy Scale: Good       Standing balance-Leahy Scale: Fair                               Pertinent Vitals/Pain Pain Assessment: No/denies pain    Home Living Family/patient expects to be discharged to:: Private residence Living Arrangements: Children;Other relatives Available Help at Discharge: Family;Available 24 hours/day Type of Home: Apartment Home Access: Stairs to enter Entrance Stairs-Rails: Right Entrance Stairs-Number of Steps: flight Home Layout: One level Home Equipment: Walker - 2 wheels;Wheelchair - manual      Prior Function Level of Independence: Needs assistance   Gait / Transfers Assistance Needed: reports using w/c at home to get into bathroom; has been working with PT  ADL's / Homemaking Assistance Needed: daughter assists with sponge bathing        Hand Dominance        Extremity/Trunk Assessment   Upper Extremity Assessment: Generalized weakness           Lower Extremity Assessment: Generalized weakness         Communication   Communication: Prefers language other than English;Interpreter utilized Field seismologist; had scheduled interpreter from language resources)  Cognition Arousal/Alertness: Awake/alert Behavior During Therapy: WFL for tasks assessed/performed Overall Cognitive Status: No family/caregiver present to determine baseline cognitive functioning (doesn't seem to understand reason for blood sugar elevation at home despite RN reports MD spent 35 min on phone with interpreter to explain)  General Comments General comments (skin integrity, edema, etc.): performed vestibular screen including oculomotor assessment positive for saccadic smooth pursuits, but normal saccades, and vertical/horizontal VOR; demonstrated no nystagmus or symptoms with modified hall pike test.    Exercises        Assessment/Plan    PT Assessment Patient needs continued PT services  PT Diagnosis  Generalized weakness;Abnormality of gait   PT Problem List Decreased strength;Decreased activity tolerance;Decreased balance;Decreased mobility;Decreased knowledge of use of DME;Decreased safety awareness  PT Treatment Interventions DME instruction;Balance training;Gait training;Stair training;Functional mobility training;Therapeutic activities;Wheelchair mobility training;Therapeutic exercise   PT Goals (Current goals can be found in the Care Plan section) Acute Rehab PT Goals Patient Stated Goal: return home soon PT Goal Formulation: With patient Time For Goal Achievement: 12/05/15 Potential to Achieve Goals: Good    Frequency Min 3X/week   Barriers to discharge        Co-evaluation               End of Session   Activity Tolerance: Patient tolerated treatment well Patient left: in bed;with call bell/phone within reach;with bed alarm set      Functional Assessment Tool Used: clinical judgement Functional Limitation: Mobility: Walking and moving around Mobility: Walking and Moving Around Current Status [id number]): At least 20 percent but less than 40 percent impaired, limited or restricted Mobility: Walking and Moving Around Goal Status [phone]): At least 1 percent but less than 20 percent impaired, limited or restricted    Time: 1340-1415 PT Time Calculation (min) (ACUTE ONLY): 35 min   Charges:   PT Evaluation $PT Eval High Complexity: 1 Procedure PT Treatments $Gait Training: 8-22 mins   PT G Codes:   PT G-Codes **NOT FOR INPATIENT CLASS** Functional Assessment Tool Used: clinical judgement Functional Limitation: Mobility: Walking and moving around Mobility: Walking and Moving Around Current Status [id number]): At least 20 percent but less than 40 percent impaired, limited or restricted Mobility: Walking and Moving Around Goal Status [phone]): At least 1 percent but less than 20 percent impaired, limited or restricted    Reginia Naas 11/28/2015, 2:39 PM  Magda Kiel,  [place] 11/28/2015

## 2015-11-28 NOTE — Consult Note (Signed)
WOC wound consult note Reason for Consult: left FA and left thigh wounds Just seen by this [facility] nurse 11/12/15, at which time these same wounds were just partial thickness ruptured bulla. However today they have evolved into full thickness wounds covered with eschar.  He has some scarring on his LE that may indicate this is a chronic problem for this gentleman.  No family the bedside to inquire and patient not able to tell me.  Wound type: unclear etiology, may be related to DM Pressure Ulcer POA: No Measurement: Left FA: 7cm x 3.5cm x 0.1 Left thigh: 7cm x 2cm x 0 Wound bed: both 100% eschar, no fluctuance  Drainage (amount, consistency, odor) none Periwound: intact  Dressing procedure/placement/frequency: Areas have progressed however without clear etiology of the cause, I will only order conservative topical care at this time and recommend follow up with a dermatologist for full work up.     Re consult if needed, will not follow at this time. Thanks  Vance Hochmuth Kellogg, [place] [phone])

## 2015-11-28 NOTE — Progress Notes (Signed)
Inpatient Diabetes Program Recommendations  AACE/ADA: New Consensus Statement on Inpatient Glycemic Control (2015)  Target Ranges:  Prepandial:   less than 140 mg/dL      Peak postprandial:   less than 180 mg/dL (1-2 hours)      Critically ill patients:  140 - 180 mg/dL   Received consult for Diabetes Management:   Patient is [place]. Called RN, Daughter is not in room at this time. I was to inquire about what he does at home as far as insulin management and diet. Spoke with RN that I will check in later today.  Please call desk phone [phone] or page [phone] before 4 pm if daughter comes back.  Thanks,  Tama Headings RN, MSN, [facility] Inpatient Diabetes Coordinator Team Pager [phone] (8a-5p)

## 2015-11-28 NOTE — Progress Notes (Signed)
[facility] PROGRESS NOTE  Brian Owen [id number] DOB: November 28, 1950 DOA: 11/27/2015 PCP: [name], MD  Brief Summary  The patient is a 65 year old male with history of recent hospitalization for uncontrolled diabetes and dehydration with acute kidney injury 2 weeks prior. Since discharge from the hospital he had ongoing weakness and gait instability for which he was working with physical therapy. This morning at 2 AM he had sudden onset of dizziness and diffuse weakness, slumping to one side when trying to stand up. He has had some mild right facial droop and weakness on the right side but no numbness which has been going on since right after he was discharged from the hospital last time. He had some confusion overnight, but no slurred speech. He had dizziness and lightheadedness when his blood sugars are high. They have been similarly high for the week, but his symptoms started suddenly last night. Dizziness was present with lying down and with standing up. In the ER, VSS notable for profound orthostasis. Labs notable for mild anemia and hyperglycemia without anion gap or acidosis.   Assessment/Plan  Orthostatic hypotension, possibly secondary to volume depletion from hyperglycemia.  - Still profoundly orthostatic this morning despite IV fluids overnight however he only made 400 cc of urine in the last 24 hours and may still be dehydrated -  Increase IV fluids -  Discontinue beta blocker -  Repeat orthostatic vital signs in a.m. -  I suspect that he has some autonomic dysfunction secondary to long-standing uncontrolled diabetes and this may not resolve -PT evaluation > home health services  Diabetes mellitus type 2, uncontrolled. Blood sugar 419. Normal anion gap. Hgb A1c 12 earlier this month. CBGs 300-400 at home on 12 units of 70/30 BID. - Increase Lantus to 20 units - diabetes coordinator evaluation for home diet compliance, insulin compliance - Continue low-dose  sliding scale insulin with aspart standing with meals  Vertigo, resolved -MRI head negative for stroke -Decrease ASA  Protein calorie malnutrition. Recent albumin 2.4.  -continue nutritional supplements -Nutritional evaluation.   Normocytic anemia, chronic. Stable.   Sick euthyroid (mild) 11/13/15. TSH low at 0.32, Free t4 elevated at 1.17, T3 low at 48. Resolved  Chronic diarrhea. C-diff ag + during recent admission.  Having diarrhea -  Resume vancomycin  Language barrier. From [place]. . Daughter helps with interpretation/translation.  Bullae now have formed large ulcers on arm and leg -  Wound care consult pending  Diet:  Diabetic Access:  PIV IVF:  Yes Proph:  Lovenox  Code Status: Full Family Communication: Patient alone Disposition Plan: Likely home with home health services tomorrow   Consultants:  None  Procedures:  MRI brain  CXR  Antibiotics:  Vancomycin ORAL 1/30   HPI/Subjective:  Patient states that his dizziness is resolving. He still has some diarrhea, one episode of watery stool so far this morning. Denies abdominal pain and nausea. Denies focal weakness, numbness, and slurred speech.    Objective: Filed Vitals:   11/28/15 0645 11/28/15 1004 11/28/15 1341 11/28/15 1355  BP: 146/62 108/62 156/89 139/78  Pulse: 73 74 87   Temp: 98.1 F (36.7 C) 97.9 F (36.6 C) 97.5 F (36.4 C)   TempSrc: Oral Oral Oral   Resp: 16 18 18    SpO2: 96% 99% 100%     Intake/Output Summary (Last 24 hours) at 11/28/15 1614 Last data filed at 11/28/15 1300  Gross per 24 hour  Intake 2244.58 ml  Output    850 ml  Net  1394.58 ml   There were no vitals filed for this visit. There is no weight on file to calculate BMI.  Exam:   General:  Adult male, No acute distress  HEENT:  NCAT, MMM  Cardiovascular:  RRR, nl S1, S2 no mrg, 2+ pulses, warm extremities  Respiratory:  CTAB, no increased WOB  Abdomen:   Hyperactive bowel sounds , soft,  NT/ND  MSK:   Normal tone and bulk, no LEE  Neuro:  Subtle right facial droop, 4-5 strength right upper and lower extremity, dysmetria on the right side Skin: He has multiple scattered hyperpigmented macules on the legs, arms and chest which appear post-inflammatory. He has a 10cm area on the left arm with discreet borders, somewhat ulcerated with a yellowish thick/eschar like coating adherence to the ulcer. He has a similar large ulcerated on the left thigh.   Data Reviewed: Basic Metabolic Panel:  Recent Labs Lab 11/27/15 0442 11/28/15 0520  NA 131* 137  K 4.3 4.1  CL 93* 100*  CO2 30 29  GLUCOSE 419* 310*  BUN 13 8  CREATININE 1.18 1.09  CALCIUM 9.0 9.0   Liver Function Tests: No results for input(s): AST, ALT, ALKPHOS, BILITOT, PROT, ALBUMIN in the last 168 hours. No results for input(s): LIPASE, AMYLASE in the last 168 hours. No results for input(s): AMMONIA in the last 168 hours. CBC:  Recent Labs Lab 11/27/15 0442 11/28/15 0520  WBC 10.5 8.7  NEUTROABS 7.8*  --   HGB 11.5* 10.8*  HCT 34.6* 32.0*  MCV 88.7 89.4  PLT 250 251    No results found for this or any previous visit (from the past 240 hour(s)).   Studies: Mr Brain Wo Contrast  11/27/2015  CLINICAL DATA:  Diabetes and renal failure. Acute onset of dizziness in weakness this morning. EXAM: MRI HEAD WITHOUT CONTRAST TECHNIQUE: Multiplanar, multiecho pulse sequences of the brain and surrounding structures were obtained without intravenous contrast. COMPARISON:  03/18/2015 FINDINGS: Diffusion imaging does not show any acute or subacute infarction. There chronic small-vessel ischemic changes of the pons. There are a few old small vessel cerebellar infarctions. Cerebral hemispheres show old small vessel insults affecting the thalami and the cerebral hemispheric white matter. No cortical or large vessel territory infarction. No mass lesion, hemorrhage, hydrocephalus or extra-axial collection. No pituitary mass.  No significant sinus disease. No skull or skullbase lesion. Major vessels the base of the brain show flow. IMPRESSION: No acute finding. Old small vessel ischemic changes affecting the brain as outlined above. Electronically Signed   By: Nelson Chimes M.D.   On: 11/27/2015 13:30   Dg Chest Port 1 View  11/27/2015  CLINICAL DATA:  65 year old male with dizziness and hyperglycemia. EXAM: PORTABLE CHEST 1 VIEW COMPARISON:  11/11/2015 and prior exam FINDINGS: The cardiomediastinal silhouette is unremarkable. There is no evidence of focal airspace disease, pulmonary edema, suspicious pulmonary nodule/mass, pleural effusion, or pneumothorax. No acute bony abnormalities are identified. IMPRESSION: No active disease. Electronically Signed   By: Margarette Canada M.D.   On: 11/27/2015 18:05    Scheduled Meds: .  stroke: mapping our early stages of recovery book   Does not apply Once  . antiseptic oral rinse  7 mL Mouth Rinse BID  . aspirin EC  325 mg Oral Daily  . enoxaparin (LOVENOX) injection  40 mg Subcutaneous Q24H  . feeding supplement (GLUCERNA SHAKE)  237 mL Oral BID PC  . [START ON 11/29/2015] feeding supplement (PRO-STAT SUGAR FREE 64)  30 mL  Oral BID BM  . insulin aspart  0-9 Units Subcutaneous TID WC  . insulin aspart  3 Units Subcutaneous TID WC  . insulin glargine  20 Units Subcutaneous QHS  . multivitamin with minerals  1 tablet Oral Daily   Continuous Infusions: . sodium chloride 125 mL/hr at 11/28/15 1217    Active Problems:   Diabetes mellitus type 2, uncontrolled (HCC)   Normocytic anemia   Language barrier to communication   HTN (hypertension)   Protein-calorie malnutrition, severe (HCC)   Weakness   Abnormal thyroid function test   Hyperglycemia   Hypotension   Vertigo   Orthostatic hypotension    Time spent: 30 min    Krystiana Fornes, [facility] Pager [phone]. If 7PM-7AM, please contact night-coverage at [email or web address], password [facility] 11/28/2015, 4:14 PM

## 2015-11-28 NOTE — Progress Notes (Signed)
PT Cancellation Note  Patient Details Name: Brian Owen MRN: [id number] DOB: [birth year]   Cancelled Treatment:    Reason Eval/Treat Not Completed: Patient not medically ready. Noted patient with severe orthostasis this morning. Will attempt to see later today (as medically appropriate and schedule allows).   [name] 11/28/2015, 8:33 AM  Pager [phone]

## 2015-11-28 NOTE — Progress Notes (Signed)
Initial Nutrition Assessment  INTERVENTION:  Provide 65ml Pro-Stat BID, each dose provides 15 grams of protein and 100 kcal Provide Glucerna Shake po BID, each supplement provides 220 kcal and 10 grams of protein Provide Multivitamin with minerals daily  NUTRITION DIAGNOSIS:   Increased nutrient needs related to wound healing as evidenced by estimated needs.   GOAL:   Patient will meet greater than or equal to 90% of their needs   MONITOR:   PO intake, Supplement acceptance, Labs, Skin  REASON FOR ASSESSMENT:   Consult Assessment of nutrition requirement/status (Malnutrition)  ASSESSMENT:   65 year old male with history of recent hospitalization for uncontrolled diabetes and dehydration with acute kidney injury 2 weeks prior. Since discharge from the hospital he had ongoing weakness and gait instability for which he was working with physical therapy. This morning at 2 AM he had sudden onset of dizziness and diffuse weakness, slumping to one side when trying to stand up. He has had some mild right facial droop and weakness on the right side .  Pt unavailable at time of visit; MD and RN at bedside. No recent weight on pt, but in review of weight history, pt's weight has been trending up this year. Per review of nutrition notes, pt and family have received diabetes nutrition education in the past. Per MD note, pt has protein calorie malnutrition.  Unable to perform nutrition-focused physical exam at this time; will re-attempt at follow up to determine nutrition status.   Labs: glucose ranging 109 to 419 mg/dL, low hemoglobin, hemoglobin A1c 12.2% on 11/11/15  Diet Order:  Diet heart healthy/carb modified Room service appropriate?: Yes; Fluid consistency:: Thin  Skin:  Wound (see comment) (bursted blister on Left arm; left thigh wounds)  Last BM:  1/29  Height:   Ht Readings from Last 1 Encounters:  07/22/15 5\' 10"  (1.778 m)    Weight:   Wt Readings from Last 1  Encounters:  07/25/15 114 lb (51.71 kg)    Ideal Body Weight:  75.5 kg (Underweight)  BMI:  There is no weight on file to calculate BMI.  Estimated Nutritional Needs:   Kcal:  1500-1700  Protein:  65-80 grams  Fluid:  1.5 L/day  EDUCATION NEEDS:   No education needs identified at this time  [place], LDN Inpatient Clinical Dietitian Pager: [phone] After Hours Pager: [phone]

## 2015-11-29 DIAGNOSIS — E1143 Type 2 diabetes mellitus with diabetic autonomic (poly)neuropathy: Secondary | ICD-10-CM

## 2015-11-29 DIAGNOSIS — A047 Enterocolitis due to Clostridium difficile: Secondary | ICD-10-CM

## 2015-11-29 DIAGNOSIS — A0472 Enterocolitis due to Clostridium difficile, not specified as recurrent: Secondary | ICD-10-CM

## 2015-11-29 DIAGNOSIS — L988 Other specified disorders of the skin and subcutaneous tissue: Secondary | ICD-10-CM

## 2015-11-29 DIAGNOSIS — E1343 Other specified diabetes mellitus with diabetic autonomic (poly)neuropathy: Secondary | ICD-10-CM

## 2015-11-29 DIAGNOSIS — R946 Abnormal results of thyroid function studies: Secondary | ICD-10-CM | POA: Diagnosis not present

## 2015-11-29 DIAGNOSIS — R238 Other skin changes: Secondary | ICD-10-CM

## 2015-11-29 LAB — BASIC METABOLIC PANEL
Anion gap: 8 (ref 5–15)
BUN: 12 mg/dL (ref 6–20)
CALCIUM: 8.8 mg/dL — AB (ref 8.9–10.3)
CHLORIDE: 101 mmol/L (ref 101–111)
CO2: 32 mmol/L (ref 22–32)
CREATININE: 1.05 mg/dL (ref 0.61–1.24)
GFR calc Af Amer: >60 mL/min (ref >60)
GFR calc non Af Amer: >60 mL/min (ref >60)
GLUCOSE: 210 mg/dL — AB (ref 65–99)
Potassium: 4.1 mmol/L (ref 3.5–5.1)
Sodium: 141 mmol/L (ref 135–145)

## 2015-11-29 LAB — CBC
HCT: 31.7 % — ABNORMAL LOW (ref 39.0–52.0)
Hemoglobin: 10.2 g/dL — ABNORMAL LOW (ref 13.0–17.0)
MCH: 28.9 pg (ref 26.0–34.0)
MCHC: 32.2 g/dL (ref 30.0–36.0)
MCV: 89.8 fL (ref 78.0–100.0)
PLATELETS: 237 10*3/uL (ref 150–400)
RBC: 3.53 MIL/uL — ABNORMAL LOW (ref 4.22–5.81)
RDW: 13.1 % (ref 11.5–15.5)
WBC: 7 10*3/uL (ref 4.0–10.5)

## 2015-11-29 LAB — GLUCOSE, CAPILLARY
GLUCOSE-CAPILLARY: 62 mg/dL — AB (ref 65–99)
Glucose-Capillary: 127 mg/dL — ABNORMAL HIGH (ref 65–99)
Glucose-Capillary: 206 mg/dL — ABNORMAL HIGH (ref 65–99)

## 2015-11-29 MED ORDER — VANCOMYCIN 50 MG/ML ORAL SOLUTION: 125.0000 mg | Freq: Four times a day (QID) | ORAL | Status: DC

## 2015-11-29 MED ORDER — INSULIN GLARGINE 100 UNIT/ML ~~LOC~~ SOLN
16.0000 [IU] | Freq: Every day | SUBCUTANEOUS | Status: DC
  Filled 2015-11-29: qty 0.16

## 2015-11-29 MED ORDER — INSULIN NPH ISOPHANE & REGULAR (70-30) 100 UNIT/ML ~~LOC~~ SUSP: 15.0000 [IU] | Freq: Two times a day (BID) | SUBCUTANEOUS | Status: DC

## 2015-11-29 MED ORDER — ASPIRIN 81 MG PO TBEC: 81.0000 mg | DELAYED_RELEASE_TABLET | Freq: Every day | ORAL | Status: DC

## 2015-11-29 NOTE — Care Management Note (Signed)
Case Management Note  Patient Details  Name: Brian Owen MRN: [id number] Date of Birth: August 01, 1951  Subjective/Objective:                    Action/Plan: Patient discharged home today without waiting to see CM. CM called and spoke to Mr Scherrie November daughter and found that patient already active with home health prior to discharge. CM found that they were using [place]. Manuela Schwartz with [facility] notified and accepted the resumption. CM also had done a benefits check for Mr Liam's vancomycin since he had trouble affording this medication in the past. The benefits check revealed a cost of $3 at [place]. This information was given to Mr Scherrie November daughter.   Expected Discharge Date:  11/29/15               Expected Discharge Plan:  [place]  In-House Referral:     Discharge planning Services  CM Consult  Post Acute Care Choice:  Home Health Choice offered to:  Adult Children  DME Arranged:    DME Agency:     HH Arranged:  RN, PT, Nurse's Aide [place] Agency:  [facility]  Status of Service:  Completed, signed off  Medicare Important Message Given:    Date Medicare IM Given:    Medicare IM give by:    Date Additional Medicare IM Given:    Additional Medicare Important Message give by:     If discussed at [place] of Stay Meetings, dates discussed:    Additional Comments:  Pollie Friar, RN 11/29/2015, 2:14 PM

## 2015-11-29 NOTE — Discharge Summary (Signed)
Physician Discharge Summary  Brian Owen MRN:[id number] DOB: 02/07/1951 DOA: 11/27/2015  PCP: [name], JOSALYN C, MD  Admit date: 11/27/2015 Discharge date: 11/29/2015  Recommendations for Outpatient Follow-up:  1. Increased 70/30 insulin to 15 units twice daily 2. Encourage the use of thigh-high TED hose during the day and recommend home health PT 3. Case manager assisted with oral vancomycin prescription 4. Follow-up with primary care doctor in one week to review blood sugars and adjust insulin as needed 5. Dermatology within 2 weeks  Discharge Diagnoses:  Principal Problem:   Autonomic neuropathy due to diabetes (HCC) Active Problems:   Diabetes mellitus type 2, uncontrolled (HCC)   Normocytic anemia   Language barrier to communication   HTN (hypertension)   Protein-calorie malnutrition, severe (HCC)   Weakness   Abnormal thyroid function test   Hyperglycemia   Hypotension   Vertigo   Orthostatic hypotension   C. difficile diarrhea   Bullae   Discharge Condition: Stable, improved  Diet recommendation: Diabetic  Wt Readings from Last 3 Encounters:  07/25/15 51.71 kg (114 lb)  07/23/15 50.7 kg (111 lb 12.4 oz)  06/23/15 50.894 kg (112 lb 3.2 oz)    History of present illness:   The patient is a 64-year-old male with history of recent hospitalization for uncontrolled diabetes and dehydration with acute kidney injury. He was discharged to partially 2 weeks ago. Since discharge from the hospital he had ongoing weakness and gait instability for which he was working with physical therapy. On the morning of admission he had sudden onset of dizziness and diffuse weakness, slumping to one side when trying to stand up. He had some mild confusion but no slurred speech. He was dizzy and lightheaded which he treated to his high blood sugars. He states every time his blood sugars become very high he has the same symptoms. His dizziness was present with lying down and with standing up. In  the emergency department, vital signs were notable for profound orthostasis. He had some mild anemia and hyperglycemia without anion gap or acidosis.  Hospital Course:   Orthostatic hypotension, possibly secondary to volume depletion due to ongoing diarrhea. He was diagnosed with C. difficile diarrhea during his last hospitalization but was unable to afford his oral vancomycin at discharge. He also had hyperglycemia which likely caused osmotic diuresis. He was given IV fluids for 2 days and his orthostasis improved somewhat but did not fully resolve. I suspect that he has some autonomic dysfunction secondary to his long-standing uncontrolled diabetes mellitus type 2. His hemoglobin A1c earlier this month was 12 which was better than previous. I recommended that he used thigh-high TED hose during the day and that he continue to work with physical therapy. It is important that he continue to work on getting his diabetes under control to prevent worsening of his orthostasis. He was notified that he is at risk for fainting spells when standing up and to sit at the edge of the bed and then stand up slowly when getting up.  I discontinued his beta blocker.  Diabetes mellitus type II, uncontrolled, initial blood sugars were in the 400s with a normal anion gap. His Hemoccult A1c was 12 earlier this month. He was placed on Lantus and sliding scale insulin with aspart with meals during hospitalization which was gradually increased. The patient met with the diabetic educator and with the nutritionist. I increased his 70/30 insulin at discharge to 15 units twice daily based on his insulin requirements here in the hospital.    Follow-up with primary care doctor in one week to review blood sugars.  Vertigo, resolved.  Not consistent with BPPV since not worsened with movement.  His MRI head was negative for stroke. He has some residual right-sided deficits which I believe are secondary to old infarcts.  Recommend daily  aspirin.  Held off on statin due to malnutrition for now.  I believe his vertigo was probably due to a combination of orthostatic hypotension and possibly hyperglycemia.  Ddx includes Meniere's.  Protein calorie malnutrition, cachectic appearing.  He met with a nutritionist who recommended Glucerna.  Normocytic anemia, chronic and stable.  Defer to primary care doctor.  Previously detected sick euthyroid.  Repeat TSH and free T4 were within normal limits. Resolved.  C. difficile diarrhea, antigen positive during recent admission. His diarrhea improved during his previous hospitalization with oral vancomycin and then worsened after he was not able to afford the medication at discharge. He was restarted on vancomycin he met with the case manager to discuss options of affording this medication on discharge.  Bullae have now formed ulcers on his arm and leg, the wound care nurse recommended conservative topical care with Xeroform. I spoke to his daughter about scheduling a dermatologist appointment as soon as possible for further evaluation.  Consultants:  None  Procedures:  MRI brain  CXR  Antibiotics:  Vancomycin ORAL 1/30  Discharge Exam: Filed Vitals:   11/29/15 0209 11/29/15 0557  BP: 142/78 132/77  Pulse: 78 80  Temp: 98.2 F (36.8 C) 98.1 F (36.7 C)  Resp: 18 18   Filed Vitals:   11/28/15 1730 11/28/15 2141 11/29/15 0209 11/29/15 0557  BP: 148/88 127/66 142/78 132/77  Pulse: 85 76 78 80  Temp: 98.1 F (36.7 C) 98.6 F (37 C) 98.2 F (36.8 C) 98.1 F (36.7 C)  TempSrc: Oral Oral Oral Oral  Resp: 18 16 18 18  SpO2: 100% 98% 99% 98%     General: Adult male, No acute distress  HEENT: NCAT, MMM  Cardiovascular: RRR, nl S1, S2 no mrg, 2+ pulses, warm extremities  Respiratory: CTAB, no increased WOB  Abdomen: NABS, soft, NT/ND  MSK: Normal tone and bulk, no LEE  Neuro: Subtle right facial droop, 4-5 strength right upper and lower extremity,  dysmetria on the right side Skin: He has multiple scattered hyperpigmented macules on the legs, arms and chest which appear post-inflammatory. He has a 10cm area on the left arm with discreet borders, somewhat ulcerated with a yellowish thick/eschar like coating adherence to the ulcer. He has a similar large ulcerated on the left thigh.   Discharge Instructions      Discharge Instructions    Call MD for:  difficulty breathing, headache or visual disturbances    Complete by:  As directed      Call MD for:  extreme fatigue    Complete by:  As directed      Call MD for:  hives    Complete by:  As directed      Call MD for:  persistant dizziness or light-headedness    Complete by:  As directed      Call MD for:  persistant nausea and vomiting    Complete by:  As directed      Call MD for:  severe uncontrolled pain    Complete by:  As directed      Call MD for:  temperature >100.4    Complete by:  As directed      Diet -   low sodium heart healthy    Complete by:  As directed      Diet Carb Modified    Complete by:  As directed      Discharge wound care:    Complete by:  As directed   Rinse with saline and cover with vaseline and dry dressing on arm and leg once daily     Increase activity slowly    Complete by:  As directed             Medication List    STOP taking these medications        metoprolol tartrate 25 MG tablet  Commonly known as:  LOPRESSOR      TAKE these medications        aspirin 81 MG EC tablet  Take 1 tablet (81 mg total) by mouth daily.     feeding supplement (PRO-STAT SUGAR FREE 64) Liqd  Take 30 mLs by mouth 3 (three) times daily with meals.     insulin NPH-regular Human (70-30) 100 UNIT/ML injection  Commonly known as:  NOVOLIN 70/30  Inject 15 Units into the skin 2 (two) times daily with a meal.     vancomycin 50 mg/mL oral solution  Commonly known as:  VANCOCIN  Take 2.5 mLs (125 mg total) by mouth 4 (four) times daily.        Follow-up Information    Follow up with [name], MD. Schedule an appointment as soon as possible for a visit in 1 week.   Specialty:  Family Medicine   Contact information:   [place] [phone]       Follow up with Dermatologist of your choice. Schedule an appointment as soon as possible for a visit in 2 weeks.       The results of significant diagnostics from this hospitalization (including imaging, microbiology, ancillary and laboratory) are listed below for reference.    Significant Diagnostic Studies: Dg Chest 2 View  11/11/2015  CLINICAL DATA:  65 year old male with generalized weakness EXAM: CHEST  2 VIEW COMPARISON:  Chest radiograph dated 07/22/2015 FINDINGS: The heart size and mediastinal contours are within normal limits. Both lungs are clear. The visualized skeletal structures are unremarkable. IMPRESSION: No active cardiopulmonary disease. Electronically Signed   By: Anner Crete M.D.   On: 11/11/2015 05:58   Mr Brain Wo Contrast  11/27/2015  CLINICAL DATA:  Diabetes and renal failure. Acute onset of dizziness in weakness this morning. EXAM: MRI HEAD WITHOUT CONTRAST TECHNIQUE: Multiplanar, multiecho pulse sequences of the brain and surrounding structures were obtained without intravenous contrast. COMPARISON:  03/18/2015 FINDINGS: Diffusion imaging does not show any acute or subacute infarction. There chronic small-vessel ischemic changes of the pons. There are a few old small vessel cerebellar infarctions. Cerebral hemispheres show old small vessel insults affecting the thalami and the cerebral hemispheric white matter. No cortical or large vessel territory infarction. No mass lesion, hemorrhage, hydrocephalus or extra-axial collection. No pituitary mass. No significant sinus disease. No skull or skullbase lesion. Major vessels the base of the brain show flow. IMPRESSION: No acute finding. Old small vessel ischemic changes  affecting the brain as outlined above. Electronically Signed   By: Nelson Chimes M.D.   On: 11/27/2015 13:30   Dg Chest Port 1 View  11/27/2015  CLINICAL DATA:  65 year old male with dizziness and hyperglycemia. EXAM: PORTABLE CHEST 1 VIEW COMPARISON:  11/11/2015 and prior exam FINDINGS: The cardiomediastinal silhouette is unremarkable. There is  no evidence of focal airspace disease, pulmonary edema, suspicious pulmonary nodule/mass, pleural effusion, or pneumothorax. No acute bony abnormalities are identified. IMPRESSION: No active disease. Electronically Signed   By: Margarette Canada M.D.   On: 11/27/2015 18:05    Microbiology: No results found for this or any previous visit (from the past 240 hour(s)).   Labs: Basic Metabolic Panel:  Recent Labs Lab 11/27/15 0442 11/28/15 0520  NA 131* 137  K 4.3 4.1  CL 93* 100*  CO2 30 29  GLUCOSE 419* 310*  BUN 13 8  CREATININE 1.18 1.09  CALCIUM 9.0 9.0   Liver Function Tests: No results for input(s): AST, ALT, ALKPHOS, BILITOT, PROT, ALBUMIN in the last 168 hours. No results for input(s): LIPASE, AMYLASE in the last 168 hours. No results for input(s): AMMONIA in the last 168 hours. CBC:  Recent Labs Lab 11/27/15 0442 11/28/15 0520  WBC 10.5 8.7  NEUTROABS 7.8*  --   HGB 11.5* 10.8*  HCT 34.6* 32.0*  MCV 88.7 89.4  PLT 250 251   Cardiac Enzymes: No results for input(s): CKTOTAL, CKMB, CKMBINDEX, TROPONINI in the last 168 hours. BNP: BNP (last 3 results) No results for input(s): BNP in the last 8760 hours.  ProBNP (last 3 results) No results for input(s): PROBNP in the last 8760 hours.  CBG:  Recent Labs Lab 11/28/15 1123 11/28/15 1729 11/28/15 2145 11/29/15 0704 11/29/15 0810  GLUCAP 165* 154* 97 62* 127*    Time coordinating discharge: 35 minutes  Signed:  Temitope Flammer  [facility] 11/29/2015, 10:19 AM

## 2015-11-29 NOTE — Progress Notes (Signed)
Pt discharged home with family. IV removed and discharge instructions given. Pt to leave unit via wheelchair with volunteer services. Wendee Copp

## 2015-11-29 NOTE — Progress Notes (Addendum)
Inpatient Diabetes Program Recommendations  AACE/ADA: New Consensus Statement on Inpatient Glycemic Control (2015)  Target Ranges:  Prepandial:   less than 140 mg/dL      Peak postprandial:   less than 180 mg/dL (1-2 hours)      Critically ill patients:  140 - 180 mg/dL   Spoke with patient's daughter over the phone about diabetes and home regimen for diabetes control. Patient lives with the daughter and she gives him 70/30 12 units BID and checks his glucose 4 x/day.  Inquired about knowledge about A1C and Daughter reports that she does not know what an A1C is. Discussed A1C results (12.2% on 11/11/2015) and explained what an A1C is, and that the goal os for the patient to be at 7% or less.  Discussed impact of nutrition, exercise, stress, sickness, and medications on diabetes control.  Daughter reports that he eats rice but drink water and sprite zero. Daughter thinks that his 70/30 insulin needs to be increased. Daughter reports that he follows with the [facility].  The daughter and patient follow his DM regimen with no problems. Daughter does report seeing glucose in the 200's when she checks his glucose. Spoke with daughter about notifying the MD about insulin doses. Daughter verbalized understanding of information discussed she was wondering about discharge, I spoke to her about the CM and MD being in contact with her.  The daughter that physically takes care of the patient is the second contact listed. Please call her at the [phone] number. Daughter has an MD appointment today and will not be at the hospital for awhile. Thanks.  While inpatient may want to switch to 70/30 insulin so we can adjust dose for discharge. Recommend, starting with 14 units BID with meals.  Thanks, Tama Headings RN, MSN, [facility] Inpatient Diabetes Coordinator Team Pager [phone] (8a-5p)

## 2015-11-29 NOTE — Progress Notes (Signed)
Physical Therapy  Spoke with patient, daughter, and other family members re: patient's mobility and assured they have the equipment they need at home (RW, wheelchair, cane). Clarified home layout (see flowsheets) and pt only has 2 steps to enter. Daughter states this has not been a problem and deferred education.    11/29/2015 Barry Brunner, PT Pager: [phone]  No charge

## 2015-11-30 DIAGNOSIS — E43 Unspecified severe protein-calorie malnutrition: Secondary | ICD-10-CM | POA: Diagnosis not present

## 2015-11-30 DIAGNOSIS — E1165 Type 2 diabetes mellitus with hyperglycemia: Secondary | ICD-10-CM | POA: Diagnosis not present

## 2015-11-30 DIAGNOSIS — I10 Essential (primary) hypertension: Secondary | ICD-10-CM | POA: Diagnosis not present

## 2015-11-30 DIAGNOSIS — S51802D Unspecified open wound of left forearm, subsequent encounter: Secondary | ICD-10-CM | POA: Diagnosis not present

## 2015-11-30 DIAGNOSIS — S71102D Unspecified open wound, left thigh, subsequent encounter: Secondary | ICD-10-CM | POA: Diagnosis not present

## 2015-11-30 DIAGNOSIS — Z87891 Personal history of nicotine dependence: Secondary | ICD-10-CM | POA: Diagnosis not present

## 2015-12-05 ENCOUNTER — Encounter: Payer: Self-pay | Admitting: Family Medicine

## 2015-12-05 ENCOUNTER — Ambulatory Visit: Payer: Medicare Other | Attending: Family Medicine | Admitting: Family Medicine

## 2015-12-05 VITALS — BP 117/76 | HR 80 | Temp 97.4°F | Resp 16 | Ht 67.0 in | Wt 106.0 lb

## 2015-12-05 DIAGNOSIS — Z79899 Other long term (current) drug therapy: Secondary | ICD-10-CM | POA: Insufficient documentation

## 2015-12-05 DIAGNOSIS — A047 Enterocolitis due to Clostridium difficile: Secondary | ICD-10-CM | POA: Diagnosis not present

## 2015-12-05 DIAGNOSIS — R238 Other skin changes: Secondary | ICD-10-CM

## 2015-12-05 DIAGNOSIS — S41102A Unspecified open wound of left upper arm, initial encounter: Secondary | ICD-10-CM | POA: Diagnosis not present

## 2015-12-05 DIAGNOSIS — E1100 Type 2 diabetes mellitus with hyperosmolarity without nonketotic hyperglycemic-hyperosmolar coma (NKHHC): Secondary | ICD-10-CM

## 2015-12-05 DIAGNOSIS — L98499 Non-pressure chronic ulcer of skin of other sites with unspecified severity: Secondary | ICD-10-CM | POA: Insufficient documentation

## 2015-12-05 DIAGNOSIS — Z7982 Long term (current) use of aspirin: Secondary | ICD-10-CM | POA: Diagnosis not present

## 2015-12-05 DIAGNOSIS — E1165 Type 2 diabetes mellitus with hyperglycemia: Secondary | ICD-10-CM | POA: Insufficient documentation

## 2015-12-05 DIAGNOSIS — S41109A Unspecified open wound of unspecified upper arm, initial encounter: Secondary | ICD-10-CM | POA: Insufficient documentation

## 2015-12-05 DIAGNOSIS — L988 Other specified disorders of the skin and subcutaneous tissue: Secondary | ICD-10-CM

## 2015-12-05 DIAGNOSIS — Z794 Long term (current) use of insulin: Secondary | ICD-10-CM

## 2015-12-05 DIAGNOSIS — Z87891 Personal history of nicotine dependence: Secondary | ICD-10-CM | POA: Diagnosis not present

## 2015-12-05 DIAGNOSIS — A0472 Enterocolitis due to Clostridium difficile, not specified as recurrent: Secondary | ICD-10-CM

## 2015-12-05 LAB — CBC WITH DIFFERENTIAL/PLATELET
BASOS ABS: 0.1 10*3/uL (ref 0.0–0.1)
Basophils Relative: 1 % (ref 0–1)
EOS ABS: 0.2 10*3/uL (ref 0.0–0.7)
EOS PCT: 3 % (ref 0–5)
HEMATOCRIT: 33.7 % — AB (ref 39.0–52.0)
Hemoglobin: 10.8 g/dL — ABNORMAL LOW (ref 13.0–17.0)
LYMPHS ABS: 2.4 10*3/uL (ref 0.7–4.0)
LYMPHS PCT: 32 % (ref 12–46)
MCH: 29.3 pg (ref 26.0–34.0)
MCHC: 32 g/dL (ref 30.0–36.0)
MCV: 91.3 fL (ref 78.0–100.0)
MONOS PCT: 8 % (ref 3–12)
MPV: 9.6 fL (ref 8.6–12.4)
Monocytes Absolute: 0.6 10*3/uL (ref 0.1–1.0)
Neutro Abs: 4.2 10*3/uL (ref 1.7–7.7)
Neutrophils Relative %: 56 % (ref 43–77)
PLATELETS: 270 10*3/uL (ref 150–400)
RBC: 3.69 MIL/uL — ABNORMAL LOW (ref 4.22–5.81)
RDW: 14 % (ref 11.5–15.5)
WBC: 7.5 10*3/uL (ref 4.0–10.5)

## 2015-12-05 LAB — POCT URINALYSIS DIPSTICK
Bilirubin, UA: NEGATIVE
Glucose, UA: 250
KETONES UA: NEGATIVE
Nitrite, UA: NEGATIVE
PH UA: 6
Spec Grav, UA: 1.01
Urobilinogen, UA: 0.2

## 2015-12-05 LAB — GLUCOSE, POCT (MANUAL RESULT ENTRY): POC GLUCOSE: 300 mg/dL — AB (ref 70–99)

## 2015-12-05 MED ORDER — ACCU-CHEK AVIVA PLUS W/DEVICE KIT: 1.0000 | PACK | Freq: Three times a day (TID) | Status: DC

## 2015-12-05 MED ORDER — SULFAMETHOXAZOLE-TRIMETHOPRIM 800-160 MG PO TABS: 1.0000 | ORAL_TABLET | Freq: Two times a day (BID) | ORAL | Status: DC

## 2015-12-05 MED ORDER — GLUCOSE BLOOD VI STRP: 1.0000 | ORAL_STRIP | Freq: Three times a day (TID) | Status: DC

## 2015-12-05 MED ORDER — ACCU-CHEK SOFTCLIX LANCETS MISC: 1.0000 | Freq: Three times a day (TID) | Status: DC

## 2015-12-05 MED ORDER — INSULIN NPH ISOPHANE & REGULAR (70-30) 100 UNIT/ML ~~LOC~~ SUSP: 20.0000 [IU] | Freq: Two times a day (BID) | SUBCUTANEOUS | Status: DC

## 2015-12-05 MED FILL — SULFAMETHOXAZOLE/TMP DS TAB: 800-160 | 10 days supply | Qty: 20 | Fill #0

## 2015-12-05 NOTE — Progress Notes (Signed)
Use [employer] interpreter Camb[place]ian# [phone] HFU due to injury, Injury on lt arm and lt leg  No pain today  No tobacco use No suicidal thought in the past two week Elevated glucose today, Pt stated had rice this morning  Took insulin 15 unit at 7:00

## 2015-12-05 NOTE — Patient Instructions (Addendum)
Brian Owen was seen today for hospitalization follow-up, diabetes and diarrhea.  Diagnoses and all orders for this visit:  Uncontrolled type 2 diabetes mellitus with hyperosmolarity without coma, with long-term current use of insulin (HCC) -     Glucose (CBG) -     POCT urinalysis dipstick -     insulin NPH-regular Human (NOVOLIN 70/30) (70-30) 100 UNIT/ML injection; Inject 20 Units into the skin 2 (two) times daily with a meal. -     Blood Glucose Monitoring Suppl (ACCU-CHEK AVIVA PLUS) w/Device KIT; 1 Device by Does not apply route 3 (three) times daily after meals. E11.65 -     glucose blood (ACCU-CHEK AVIVA PLUS) test strip; 1 each by Other route 3 (three) times daily. E11.65 -     ACCU-CHEK SOFTCLIX LANCETS lancets; 1 each by Other route 3 (three) times daily. E11.65  Bullae -     Cancel: Ambulatory referral to Dermatology  Arm wound, left, initial encounter -     Discontinue: sulfamethoxazole-trimethoprim (BACTRIM DS,SEPTRA DS) 800-160 MG tablet; Take 1 tablet by mouth 2 (two) times daily. -     Ambulatory referral to Wound Clinic -     sulfamethoxazole-trimethoprim (BACTRIM DS,SEPTRA DS) 800-160 MG tablet; Take 1 tablet by mouth 2 (two) times daily. -     Cancel: Wound culture -     CBC with Differential -     Ambulatory referral to Wound Clinic   Diabetes blood sugar goals  Fasting (in AM before breakfast, 8 hrs of no eating or drinking (except water or unsweetened coffee or tea): 90-110 2 hrs after meals: < 160,   No low sugars: nothing < 70   Change dressing daily STOP use of creams and ointments on the wounds You will be called with wound care appointement  F/u in 2 weeks for wound check and CBG check  Dr. Adrian Blackwater

## 2015-12-05 NOTE — Assessment & Plan Note (Signed)
A; large bullae has become infected ulcer on L arm in setting of uncontrolled diabetes P: Wound culture Bactrim DS Wound care consult  Stop use of topical creams on ointments until evaluated by wound care Daily dressing changes and daily wound with wound cleanser

## 2015-12-05 NOTE — Assessment & Plan Note (Signed)
Recent C diff diarrhea WBC normalized by time of discharge Patient did not complete vanc course Diarrhea has resolved Normal exam  Suspect resolution of C diff   Repeat CBC today

## 2015-12-05 NOTE — Progress Notes (Signed)
Subjective:  Patient ID: Brian Owen, male    DOB: June 27, 1951  Age: 65 y.o. MRN: [id number]  CC: Hospitalization Follow-up; Diabetes; and Diarrhea   HPI Hughes Pulse has uncontrolled diabetes, he presents with his daughter and granddaughter   1. CHRONIC DIABETES patient recently hospitalized from 11/27/15 to 11/29/15 for hyperglycemia with CBGs up to 389.   Disease Monitoring  Blood Sugar Ranges: not checking, no meter at home   Polyuria: no   Visual problems: no   Medication Compliance: yes, taking novolin 70/30 15 U BID   Medication Side Effects  Hypoglycemia: no   Preventitive Health Care  Eye Exam: due   Foot Exam: done today   Diet pattern: eats 3 meal per day   Exercise: none   2. C diff: no diarrhea, fever or abdominal pain. Patient did not complete oral C diff as he could not afford the $100 co-pay.   3. Bullae: x 6 weeks. On L arm and L leg. First noted when he returned for a trip to [place]. Was present and remained closed during his hospital stay. Last dressing change was a few days ago. Family has ran out of tape. Home health nurse advised use of a cream or ointment, family does not recall the name. Applying to arm and leg.   Social History  Substance Use Topics  . Smoking status: Former Smoker -- 0.25 packs/day for 48 years    Types: Cigarettes    Quit date: 04/28/2014  . Smokeless tobacco: Never Used  . Alcohol Use: No    Outpatient Prescriptions Prior to Visit  Medication Sig Dispense Refill  . Amino Acids-Protein Hydrolys (FEEDING SUPPLEMENT, PRO-STAT SUGAR FREE 64,) LIQD Take 30 mLs by mouth 3 (three) times daily with meals. (Patient not taking: Reported on 11/27/2015) 900 mL 0  . aspirin EC 81 MG EC tablet Take 1 tablet (81 mg total) by mouth daily. 90 tablet 0  . insulin NPH-regular Human (NOVOLIN 70/30) (70-30) 100 UNIT/ML injection Inject 15 Units into the skin 2 (two) times daily with a meal. 10 mL 2  . vancomycin (VANCOCIN) 50 mg/mL oral solution Take 2.5  mLs (125 mg total) by mouth 4 (four) times daily. 200 mL 0   No facility-administered medications prior to visit.    ROS Review of Systems  Constitutional: Negative for fever, chills, fatigue and unexpected weight change.  Eyes: Negative for visual disturbance.  Respiratory: Negative for cough and shortness of breath.   Cardiovascular: Negative for chest pain, palpitations and leg swelling.  Gastrointestinal: Negative for nausea, vomiting, abdominal pain, diarrhea, constipation and blood in stool.  Endocrine: Negative for polydipsia, polyphagia and polyuria.  Musculoskeletal: Negative for myalgias, back pain, arthralgias, gait problem and neck pain.  Skin: Positive for wound. Negative for rash.  Allergic/Immunologic: Negative for immunocompromised state.  Hematological: Negative for adenopathy. Does not bruise/bleed easily.  Psychiatric/Behavioral: Negative for suicidal ideas, sleep disturbance and dysphoric mood. The patient is not nervous/anxious.     Objective:  BP 117/76 mmHg  Pulse 80  Temp(Src) 97.4 F (36.3 C) (Oral)  Resp 16  Ht _0  (1.702 m)  Wt 106 lb (48.081 kg)  BMI 16.60 kg/m2  SpO2 100%  BP/Weight 12/05/2015 11/29/2015 6/59/9357  Systolic BP 017 793 903  Diastolic BP 76 97 86  Wt. (Lbs) 106 - -  BMI 16.6 - -   Wt Readings from Last 3 Encounters:  12/05/15 106 lb (48.081 kg)  07/25/15 114 lb (51.71 kg)  07/23/15 111 lb  12.4 oz (50.7 kg)     Physical Exam  Constitutional: He appears well-developed and well-nourished. No distress.  HENT:  Head: Normocephalic and atraumatic.  Neck: Normal range of motion. Neck supple.  Cardiovascular: Normal rate, regular rhythm, normal heart sounds and intact distal pulses.   Pulses:      Radial pulses are 2+ on the right side, and 2+ on the left side.       Posterior tibial pulses are 1+ on the right side, and 1+ on the left side.  Pulmonary/Chest: Effort normal and breath sounds normal.  Abdominal: Soft. Bowel sounds  are normal. He exhibits no distension and no mass. There is no tenderness. There is no rebound and no guarding.  Musculoskeletal: He exhibits no edema.  Neurological: He is alert.  Skin: Skin is warm and dry. No rash noted. No erythema.     Psychiatric: He has a normal mood and affect.    Lab Results  Component Value Date   HGBA1C 12.2* 11/11/2015   CBG  Assessment & Plan:  Mccade was seen today for hospitalization follow-up, diabetes, diarrhea and blister.  Diagnoses and all orders for this visit:  Uncontrolled type 2 diabetes mellitus with hyperosmolarity without coma, with long-term current use of insulin (HCC) -     Glucose (CBG) -     POCT urinalysis dipstick -     insulin NPH-regular Human (NOVOLIN 70/30) (70-30) 100 UNIT/ML injection; Inject 20 Units into the skin 2 (two) times daily with a meal. -     Blood Glucose Monitoring Suppl (ACCU-CHEK AVIVA PLUS) w/Device KIT; 1 Device by Does not apply route 3 (three) times daily after meals. E11.65 -     glucose blood (ACCU-CHEK AVIVA PLUS) test strip; 1 each by Other route 3 (three) times daily. E11.65 -     ACCU-CHEK SOFTCLIX LANCETS lancets; 1 each by Other route 3 (three) times daily. E11.65  Bullae -     Cancel: Ambulatory referral to Dermatology  Arm wound, left, initial encounter -     Discontinue: sulfamethoxazole-trimethoprim (BACTRIM DS,SEPTRA DS) 800-160 MG tablet; Take 1 tablet by mouth 2 (two) times daily. -     Cancel: Ambulatory referral to Wound Clinic -     sulfamethoxazole-trimethoprim (BACTRIM DS,SEPTRA DS) 800-160 MG tablet; Take 1 tablet by mouth 2 (two) times daily. -     Cancel: Wound culture -     CBC with Differential -     Ambulatory referral to Wound Clinic -     Wound culture  C. difficile diarrhea   Follow-up: No Follow-up on file.   Boykin Nearing MD

## 2015-12-05 NOTE — Assessment & Plan Note (Signed)
A: uncontrolled diabetes. Not checking home CBGs. Hyperglycemia in office today P: Increase novolin 70/30 to 20 U BID Meter ordered CBGs goals provided

## 2015-12-06 ENCOUNTER — Encounter: Payer: Self-pay | Admitting: Clinical

## 2015-12-06 DIAGNOSIS — I10 Essential (primary) hypertension: Secondary | ICD-10-CM | POA: Diagnosis not present

## 2015-12-06 DIAGNOSIS — E1165 Type 2 diabetes mellitus with hyperglycemia: Secondary | ICD-10-CM | POA: Diagnosis not present

## 2015-12-06 DIAGNOSIS — E43 Unspecified severe protein-calorie malnutrition: Secondary | ICD-10-CM | POA: Diagnosis not present

## 2015-12-06 DIAGNOSIS — S71102D Unspecified open wound, left thigh, subsequent encounter: Secondary | ICD-10-CM | POA: Diagnosis not present

## 2015-12-06 DIAGNOSIS — Z87891 Personal history of nicotine dependence: Secondary | ICD-10-CM | POA: Diagnosis not present

## 2015-12-06 DIAGNOSIS — S51802D Unspecified open wound of left forearm, subsequent encounter: Secondary | ICD-10-CM | POA: Diagnosis not present

## 2015-12-06 NOTE — Progress Notes (Signed)
Cindy from [place] wants [facility] to know that when she went out to Mr. Chhims' home, he did not have any insulin or testing supplies, such as the glucometer, lancets, or test strips. [facility] pharmacy says that, because of pt insurance ([place]), that he can take his insurance card to [facility] to pick up testing supplies, and that there should not be a charge, unless patient has a deductible. It is advised that Mr. Marzella Schlein (or his daughter, or another family member) contact the insurance company to inquire about any deductible that is being charged. Cindy from [facility] says she will bring a glucometer to Mr. Marzella Schlein home, when she goes out again, at next weeks' visit.

## 2015-12-07 ENCOUNTER — Telehealth: Payer: Self-pay | Admitting: *Deleted

## 2015-12-07 NOTE — Telephone Encounter (Signed)
-----   Message from Boykin Nearing, MD sent at 12/06/2015  8:27 AM EST ----- Normal WBC Stable Hgb

## 2015-12-07 NOTE — Telephone Encounter (Signed)
Used [employer] interpreter [place]# [phone] LVM to return call

## 2015-12-08 LAB — WOUND CULTURE: Gram Stain: NONE SEEN

## 2015-12-13 ENCOUNTER — Ambulatory Visit: Payer: Medicare Other | Admitting: Endocrinology

## 2015-12-13 ENCOUNTER — Encounter (HOSPITAL_BASED_OUTPATIENT_CLINIC_OR_DEPARTMENT_OTHER): Payer: Medicare Other

## 2015-12-13 DIAGNOSIS — Z87891 Personal history of nicotine dependence: Secondary | ICD-10-CM | POA: Diagnosis not present

## 2015-12-13 DIAGNOSIS — E43 Unspecified severe protein-calorie malnutrition: Secondary | ICD-10-CM | POA: Diagnosis not present

## 2015-12-13 DIAGNOSIS — S51802D Unspecified open wound of left forearm, subsequent encounter: Secondary | ICD-10-CM | POA: Diagnosis not present

## 2015-12-13 DIAGNOSIS — I10 Essential (primary) hypertension: Secondary | ICD-10-CM | POA: Diagnosis not present

## 2015-12-13 DIAGNOSIS — S71102D Unspecified open wound, left thigh, subsequent encounter: Secondary | ICD-10-CM | POA: Diagnosis not present

## 2015-12-13 DIAGNOSIS — E1165 Type 2 diabetes mellitus with hyperglycemia: Secondary | ICD-10-CM | POA: Diagnosis not present

## 2015-12-14 ENCOUNTER — Telehealth: Payer: Self-pay | Admitting: *Deleted

## 2015-12-14 NOTE — Telephone Encounter (Signed)
-----   Message from Boykin Nearing, MD sent at 12/08/2015  8:37 AM EST ----- Pan-sensitive staph aureus on wound culture Continue current care plan Be sure to take all of the antibiotic

## 2015-12-14 NOTE — Telephone Encounter (Signed)
Used [employer] interpreter [place]# [phone] LVM to return call

## 2015-12-15 DIAGNOSIS — E1165 Type 2 diabetes mellitus with hyperglycemia: Secondary | ICD-10-CM | POA: Diagnosis not present

## 2015-12-15 DIAGNOSIS — S51802D Unspecified open wound of left forearm, subsequent encounter: Secondary | ICD-10-CM | POA: Diagnosis not present

## 2015-12-15 DIAGNOSIS — Z87891 Personal history of nicotine dependence: Secondary | ICD-10-CM | POA: Diagnosis not present

## 2015-12-15 DIAGNOSIS — E43 Unspecified severe protein-calorie malnutrition: Secondary | ICD-10-CM | POA: Diagnosis not present

## 2015-12-15 DIAGNOSIS — I10 Essential (primary) hypertension: Secondary | ICD-10-CM | POA: Diagnosis not present

## 2015-12-15 DIAGNOSIS — S71102D Unspecified open wound, left thigh, subsequent encounter: Secondary | ICD-10-CM | POA: Diagnosis not present

## 2015-12-19 ENCOUNTER — Encounter (HOSPITAL_COMMUNITY): Payer: Self-pay | Admitting: Family Medicine

## 2015-12-19 ENCOUNTER — Emergency Department (HOSPITAL_COMMUNITY)
Admission: EM | Admit: 2015-12-19 | Discharge: 2015-12-19 | Disposition: A | Discharge status: Home / Self Care | Payer: Medicare Other | Attending: Emergency Medicine | Admitting: Emergency Medicine

## 2015-12-19 DIAGNOSIS — Z794 Long term (current) use of insulin: Secondary | ICD-10-CM | POA: Insufficient documentation

## 2015-12-19 DIAGNOSIS — R4182 Altered mental status, unspecified: Secondary | ICD-10-CM | POA: Diagnosis not present

## 2015-12-19 DIAGNOSIS — Z792 Long term (current) use of antibiotics: Secondary | ICD-10-CM | POA: Diagnosis not present

## 2015-12-19 DIAGNOSIS — R21 Rash and other nonspecific skin eruption: Secondary | ICD-10-CM | POA: Diagnosis not present

## 2015-12-19 DIAGNOSIS — Z79899 Other long term (current) drug therapy: Secondary | ICD-10-CM | POA: Diagnosis not present

## 2015-12-19 DIAGNOSIS — E161 Other hypoglycemia: Secondary | ICD-10-CM | POA: Diagnosis not present

## 2015-12-19 DIAGNOSIS — L299 Pruritus, unspecified: Secondary | ICD-10-CM | POA: Insufficient documentation

## 2015-12-19 DIAGNOSIS — E162 Hypoglycemia, unspecified: Secondary | ICD-10-CM

## 2015-12-19 DIAGNOSIS — Z7982 Long term (current) use of aspirin: Secondary | ICD-10-CM | POA: Insufficient documentation

## 2015-12-19 DIAGNOSIS — E11649 Type 2 diabetes mellitus with hypoglycemia without coma: Secondary | ICD-10-CM | POA: Insufficient documentation

## 2015-12-19 DIAGNOSIS — Z87891 Personal history of nicotine dependence: Secondary | ICD-10-CM | POA: Insufficient documentation

## 2015-12-19 LAB — CBC WITH DIFFERENTIAL/PLATELET
Basophils Absolute: 0 10*3/uL (ref 0.0–0.1)
Basophils Relative: 0 %
EOS ABS: 0.2 10*3/uL (ref 0.0–0.7)
EOS PCT: 3 %
HEMATOCRIT: 32.9 % — AB (ref 39.0–52.0)
HEMOGLOBIN: 10.7 g/dL — AB (ref 13.0–17.0)
LYMPHS ABS: 1.6 10*3/uL (ref 0.7–4.0)
LYMPHS PCT: 26 %
MCH: 29.7 pg (ref 26.0–34.0)
MCHC: 32.5 g/dL (ref 30.0–36.0)
MCV: 91.4 fL (ref 78.0–100.0)
MONO ABS: 0.5 10*3/uL (ref 0.1–1.0)
MONOS PCT: 8 %
Neutro Abs: 4 10*3/uL (ref 1.7–7.7)
Neutrophils Relative %: 63 %
Platelets: 203 10*3/uL (ref 150–400)
RBC: 3.6 MIL/uL — ABNORMAL LOW (ref 4.22–5.81)
RDW: 15.1 % (ref 11.5–15.5)
WBC: 6.3 10*3/uL (ref 4.0–10.5)

## 2015-12-19 LAB — COMPREHENSIVE METABOLIC PANEL
ALBUMIN: 3.4 g/dL — AB (ref 3.5–5.0)
ALT: 28 U/L (ref 17–63)
ANION GAP: 7 (ref 5–15)
AST: 27 U/L (ref 15–41)
Alkaline Phosphatase: 123 U/L (ref 38–126)
BILIRUBIN TOTAL: 0.5 mg/dL (ref 0.3–1.2)
BUN: 19 mg/dL (ref 6–20)
CALCIUM: 9.1 mg/dL (ref 8.9–10.3)
CO2: 21 mmol/L — ABNORMAL LOW (ref 22–32)
Chloride: 113 mmol/L — ABNORMAL HIGH (ref 101–111)
Creatinine, Ser: 1.36 mg/dL — ABNORMAL HIGH (ref 0.61–1.24)
GFR, EST NON AFRICAN AMERICAN: 53 mL/min — AB (ref >60)
Glucose, Bld: 59 mg/dL — ABNORMAL LOW (ref 65–99)
POTASSIUM: 4.4 mmol/L (ref 3.5–5.1)
Sodium: 141 mmol/L (ref 135–145)
TOTAL PROTEIN: 8.3 g/dL — AB (ref 6.5–8.1)

## 2015-12-19 LAB — CBG MONITORING, ED
GLUCOSE-CAPILLARY: 229 mg/dL — AB (ref 65–99)
GLUCOSE-CAPILLARY: 65 mg/dL (ref 65–99)
GLUCOSE-CAPILLARY: 183 mg/dL — AB (ref 65–99)
GLUCOSE-CAPILLARY: 126 mg/dL — AB (ref 65–99)
Glucose-Capillary: 99 mg/dL (ref 65–99)

## 2015-12-19 MED ORDER — HYDROXYZINE HCL 25 MG PO TABS: 25.0000 mg | ORAL_TABLET | Freq: Four times a day (QID) | ORAL | Status: DC | PRN

## 2015-12-19 MED ORDER — DEXTROSE 50 % IV SOLN
25.0000 mL | Freq: Once | INTRAVENOUS | Status: AC
  Administered 2015-12-19: 25 mL via INTRAVENOUS
  Filled 2015-12-19: qty 50

## 2015-12-19 NOTE — ED Notes (Addendum)
Patient is from home and transported by [facility]. EMS called out for altered mental status and hypoglycemia. Initial blood sugar was 21mg /dL. IV (20g right AC) obtained and D50 administered enroute. Last blood sugar with EMS is 227mg /dL.

## 2015-12-19 NOTE — ED Notes (Signed)
Patient was provided another ham sandwich and juice. Pt remains alert, oriented x 3. Family at bedside.

## 2015-12-19 NOTE — ED Provider Notes (Signed)
CSN: [id number]     Arrival date & time 12/19/15  1519 History   First MD Initiated Contact with Patient 12/19/15 1534     Chief Complaint  Patient presents with  . Hypoglycemia  . Altered Mental Status     (Consider location/radiation/quality/duration/timing/severity/associated sxs/prior Treatment) Patient is a 65 y.o. male presenting with hypoglycemia and altered mental status. The history is provided by a relative (The family states that the patient was unresponsive so they called 911. They did not have a way to check his blood sugar. His sugar was low when the paramedics arrived).  Hypoglycemia Severity:  Severe Onset quality:  Sudden Timing:  Rare Progression:  Resolved Chronicity:  New Diabetic status:  Controlled with insulin Associated symptoms: altered mental status and weakness   Associated symptoms: no seizures   Altered Mental Status Associated symptoms: weakness   Associated symptoms: no abdominal pain, no hallucinations, no headaches, no rash and no seizures     Past Medical History  Diagnosis Date  . Heavy cigarette smoker   . Bullae 11/11/2015    legs/notes 11/11/2015  . Uncontrolled type II diabetes mellitus ([place]) dx'd 2013   Past Surgical History  Procedure Laterality Date  . No past surgeries     Family History  Problem Relation Age of Onset  . Diabetes     Social History  Substance Use Topics  . Smoking status: Former Smoker -- 0.25 packs/day for 48 years    Types: Cigarettes    Quit date: 04/28/2014  . Smokeless tobacco: Never Used  . Alcohol Use: No    Review of Systems  Constitutional: Negative for appetite change and fatigue.  HENT: Negative for congestion, ear discharge and sinus pressure.   Eyes: Negative for discharge.  Respiratory: Negative for cough.   Cardiovascular: Negative for chest pain.  Gastrointestinal: Negative for abdominal pain and diarrhea.  Genitourinary: Negative for frequency and hematuria.  Musculoskeletal: Negative  for back pain.  Skin: Negative for rash.  Neurological: Positive for weakness. Negative for seizures and headaches.  Psychiatric/Behavioral: Negative for hallucinations.      Allergies  Review of patient's allergies indicates no known allergies.  Home Medications   Prior to Admission medications   Medication Sig Start Date End Date Taking? Authorizing Provider  acetaminophen (TYLENOL) 500 MG tablet Take 500 mg by mouth every 6 (six) hours as needed for moderate pain or headache.   Yes Historical Provider, MD  aspirin EC 81 MG EC tablet Take 1 tablet (81 mg total) by mouth daily. 11/29/15  Yes Janece Canterbury, MD  insulin NPH-regular Human (NOVOLIN 70/30) (70-30) 100 UNIT/ML injection Inject 20 Units into the skin 2 (two) times daily with a meal. 12/05/15  Yes Josalyn [name], MD  metoprolol tartrate (LOPRESSOR) 25 MG tablet Take 25 mg by mouth 2 (two) times daily. 11/13/15  Yes Historical Provider, MD  ACCU-CHEK SOFTCLIX LANCETS lancets 1 each by Other route 3 (three) times daily. E11.65 12/05/15   Boykin Nearing, MD  Amino Acids-Protein Hydrolys (FEEDING SUPPLEMENT, PRO-STAT SUGAR FREE 64,) LIQD Take 30 mLs by mouth 3 (three) times daily with meals. Patient not taking: Reported on 12/05/2015 11/13/15   Thurnell Lose, MD  Blood Glucose Monitoring Suppl (ACCU-CHEK AVIVA PLUS) w/Device KIT 1 Device by Does not apply route 3 (three) times daily after meals. E11.65 12/05/15   Josalyn [name], MD  glucose blood (ACCU-CHEK AVIVA PLUS) test strip 1 each by Other route 3 (three) times daily. E11.65 12/05/15   Boykin Nearing, MD  hydrOXYzine (ATARAX/VISTARIL) 25 MG tablet Take 1 tablet (25 mg total) by mouth every 6 (six) hours as needed for itching. 12/19/15   Milton Ferguson, MD  sulfamethoxazole-trimethoprim (BACTRIM DS,SEPTRA DS) 800-160 MG tablet Take 1 tablet by mouth 2 (two) times daily. 12/05/15   Josalyn [name], MD   BP 121/73 mmHg  Pulse 61  Temp(Src) 97.3 F (36.3 C) (Oral)  Resp 16  Ht '5\' 4"'   (1.626 m)  Wt 106 lb (48.081 kg)  BMI 18.19 kg/m2  SpO2 99% Physical Exam  Constitutional: He is oriented to person, place, and time. He appears well-developed.  HENT:  Head: Normocephalic.  Eyes: Conjunctivae and EOM are normal. No scleral icterus.  Neck: Neck supple. No thyromegaly present.  Cardiovascular: Normal rate and regular rhythm.  Exam reveals no gallop and no friction rub.   No murmur heard. Pulmonary/Chest: No stridor. He has no wheezes. He has no rales. He exhibits no tenderness.  Abdominal: He exhibits no distension. There is no tenderness. There is no rebound.  Musculoskeletal: Normal range of motion. He exhibits no edema.  Lymphadenopathy:    He has no cervical adenopathy.  Neurological: He is oriented to person, place, and time. He exhibits normal muscle tone. Coordination normal.  Skin: Rash noted. No erythema.  Patient has a pruritic rash maculopapular on his upper back and lower neck posteriorly  Psychiatric: He has a normal mood and affect. His behavior is normal.    ED Course  Procedures (including critical care time) Labs Review Labs Reviewed  CBC WITH DIFFERENTIAL/PLATELET - Abnormal; Notable for the following:    RBC 3.60 (*)    Hemoglobin 10.7 (*)    HCT 32.9 (*)    All other components within normal limits  COMPREHENSIVE METABOLIC PANEL - Abnormal; Notable for the following:    Chloride 113 (*)    CO2 21 (*)    Glucose, Bld 59 (*)    Creatinine, Ser 1.36 (*)    Total Protein 8.3 (*)    Albumin 3.4 (*)    GFR calc non Af Amer 53 (*)    All other components within normal limits  CBG MONITORING, ED - Abnormal; Notable for the following:    Glucose-Capillary 229 (*)    All other components within normal limits  CBG MONITORING, ED - Abnormal; Notable for the following:    Glucose-Capillary 183 (*)    All other components within normal limits  CBG MONITORING, ED - Abnormal; Notable for the following:    Glucose-Capillary 126 (*)    All other  components within normal limits  CBG MONITORING, ED  CBG MONITORING, ED    Imaging Review No results found. I have personally reviewed and evaluated these images and lab results as part of my medical decision-making.   EKG Interpretation None      MDM   Final diagnoses:  Hypoglycemia    Patient blood sugar dropped low but while in the emergency department he ate a meal was given another half amp of D50. His sugar normalized. He was taking 20 units of insulin twice a day and has been increased from 12 units twice a day approximately a week ago. We will decrease his insulin back to 12 units twice a day. He is given a prescription of Vistaril for his pruritic rash on his back and he can follow-up with PCP in 2-3 days    Milton Ferguson, MD 12/19/15 1910

## 2015-12-19 NOTE — Discharge Instructions (Signed)
Decrease her insulin back to 12 units twice a day and follow-up with your primary doctor this week

## 2015-12-19 NOTE — ED Notes (Signed)
Per family, patient's insulin has been changed from 12U to 20U twice a day. Family is unsure of why is insulin was changed. Denies missing a dose of medication or meal.

## 2015-12-19 NOTE — ED Notes (Signed)
Provided patient a ham sandwich and apple juice with permission from provider.

## 2015-12-20 ENCOUNTER — Ambulatory Visit: Payer: Medicare Other | Admitting: Family Medicine

## 2015-12-20 ENCOUNTER — Encounter: Payer: Self-pay | Admitting: Family Medicine

## 2015-12-20 ENCOUNTER — Ambulatory Visit: Payer: Medicare Other | Attending: Family Medicine | Admitting: Family Medicine

## 2015-12-20 VITALS — BP 109/66 | HR 71 | Temp 98.1°F | Resp 16 | Ht 67.0 in | Wt 113.0 lb

## 2015-12-20 DIAGNOSIS — Z794 Long term (current) use of insulin: Secondary | ICD-10-CM | POA: Diagnosis not present

## 2015-12-20 DIAGNOSIS — L282 Other prurigo: Secondary | ICD-10-CM

## 2015-12-20 DIAGNOSIS — Z87891 Personal history of nicotine dependence: Secondary | ICD-10-CM | POA: Diagnosis not present

## 2015-12-20 DIAGNOSIS — E1100 Type 2 diabetes mellitus with hyperosmolarity without nonketotic hyperglycemic-hyperosmolar coma (NKHHC): Secondary | ICD-10-CM

## 2015-12-20 DIAGNOSIS — S51802D Unspecified open wound of left forearm, subsequent encounter: Secondary | ICD-10-CM | POA: Diagnosis not present

## 2015-12-20 DIAGNOSIS — E1165 Type 2 diabetes mellitus with hyperglycemia: Secondary | ICD-10-CM | POA: Diagnosis not present

## 2015-12-20 DIAGNOSIS — S71102D Unspecified open wound, left thigh, subsequent encounter: Secondary | ICD-10-CM | POA: Diagnosis not present

## 2015-12-20 DIAGNOSIS — I10 Essential (primary) hypertension: Secondary | ICD-10-CM | POA: Diagnosis not present

## 2015-12-20 DIAGNOSIS — E43 Unspecified severe protein-calorie malnutrition: Secondary | ICD-10-CM | POA: Diagnosis not present

## 2015-12-20 DIAGNOSIS — L98499 Non-pressure chronic ulcer of skin of other sites with unspecified severity: Secondary | ICD-10-CM

## 2015-12-20 LAB — GLUCOSE, POCT (MANUAL RESULT ENTRY): POC GLUCOSE: 217 mg/dL — AB (ref 70–99)

## 2015-12-20 MED ORDER — HYDROXYZINE HCL 25 MG PO TABS: 25.0000 mg | ORAL_TABLET | Freq: Four times a day (QID) | ORAL | Status: DC | PRN

## 2015-12-20 MED ORDER — TRIAMCINOLONE ACETONIDE 0.1 % EX CREA: 1.0000 "application " | TOPICAL_CREAM | Freq: Two times a day (BID) | CUTANEOUS | Status: DC

## 2015-12-20 MED ORDER — INSULIN NPH ISOPHANE & REGULAR (70-30) 100 UNIT/ML ~~LOC~~ SUSP: 12.0000 [IU] | Freq: Two times a day (BID) | SUBCUTANEOUS | Status: DC

## 2015-12-20 NOTE — Assessment & Plan Note (Signed)
A: uncontrolled diabetes with labile CBGs. Patient and family not monitoring CBGs. Recent hypoglycemia P: Continue 70/30 12 U BID Stressed the importance of home CBG monitoring and that testing supplies were re-ordered and sent to onsite pharmacy following last OV.

## 2015-12-20 NOTE — Assessment & Plan Note (Signed)
A pruritic rash, possible drug induced rash given treatment with vanco then bactrim. He is no longer taking antibiotics P: Atarax  Topical steroid If no improvement with above, derm referral

## 2015-12-20 NOTE — Progress Notes (Signed)
F/U CBG, rt hand wound check  C/C body itching  Pain scale #10 No tobacco user  No suicidal thoughts in the past two weeks Used  [employer] # [phone]

## 2015-12-20 NOTE — Assessment & Plan Note (Signed)
A; L arm ulcer in diabetic s/p bactrim. Does not appear infected. Still awaiting wound care. P: Patient to be scheduled with wound care at [facility] Patient to continue daily cleaning and dressing changes duoderm applied today

## 2015-12-20 NOTE — Progress Notes (Signed)
Patient ID: Brian Owen, male   DOB: [birth year], 65 y.o.   MRN: [id number]   Subjective:  Patient ID: Brian Owen, male    DOB: [birth year]  Age: 65 y.o. MRN: [id number]  CC: Hypertension   HPI Brian Owen has uncontrolled diabetes, he presents with his daughter   1. CHRONIC DIABETES: he presented to ED yesterday with blood sugar of 21  Disease Monitoring  Blood Sugar Ranges: not checking, no meter at home   Polyuria: no   Visual problems: no   Medication Compliance: yes, taking novolin 70/30 12 U BID, dose decreased down from 20 U BID due to hypoglycemia  Medication Side Effects  Hypoglycemia: yes   Preventitive Health Care  Eye Exam: due   Foot Exam: done today   Diet pattern: eats 3 meal per day   Exercise: none   2. Ulcers: started as bullae 8 weeks ago.  On L arm and L leg. First noted when he returned for a trip to [place]. Was present and remained closed during his hospital stay from 10/27/2015-11/28/2014. He developed ulceration. At last OV, he wast treated with bactrim. Wound culture grew out MSSA. He has completed Bactrim. No pain at ulcer sites. No fever.  He has been doing daily dressing changes. He has not yet seen wound care as there is a waiting list with local wound care. His daughter is amenable to taking him to [facility] for wound care.   3. Rash: pruritic papular rash on upper back and neck. Worsening. Present for the past 6 weeks. He was prescribed atarax for the itching but has not yet started taking it.   Social History  Substance Use Topics  . Smoking status: Former Smoker -- 0.25 packs/day for 48 years    Types: Cigarettes    Quit date: 04/28/2014  . Smokeless tobacco: Never Used  . Alcohol Use: No    Outpatient Prescriptions Prior to Visit  Medication Sig Dispense Refill  . acetaminophen (TYLENOL) 500 MG tablet Take 500 mg by mouth every 6 (six) hours as needed for moderate pain or headache.    Marland Kitchen aspirin EC 81 MG EC tablet Take 1 tablet (81 mg total) by mouth  daily. 90 tablet 0  . hydrOXYzine (ATARAX/VISTARIL) 25 MG tablet Take 1 tablet (25 mg total) by mouth every 6 (six) hours as needed for itching. 12 tablet 0  . insulin NPH-regular Human (NOVOLIN 70/30) (70-30) 100 UNIT/ML injection Inject 20 Units into the skin 2 (two) times daily with a meal. 10 mL 2  . metoprolol tartrate (LOPRESSOR) 25 MG tablet Take 25 mg by mouth 2 (two) times daily.  0  . ACCU-CHEK SOFTCLIX LANCETS lancets 1 each by Other route 3 (three) times daily. E11.65 (Patient not taking: Reported on 12/20/2015) 100 each 12  . Amino Acids-Protein Hydrolys (FEEDING SUPPLEMENT, PRO-STAT SUGAR FREE 64,) LIQD Take 30 mLs by mouth 3 (three) times daily with meals. (Patient not taking: Reported on 12/05/2015) 900 mL 0  . Blood Glucose Monitoring Suppl (ACCU-CHEK AVIVA PLUS) w/Device KIT 1 Device by Does not apply route 3 (three) times daily after meals. E11.65 (Patient not taking: Reported on 12/20/2015) 1 kit 0  . glucose blood (ACCU-CHEK AVIVA PLUS) test strip 1 each by Other route 3 (three) times daily. E11.65 (Patient not taking: Reported on 12/20/2015) 100 each 12  . sulfamethoxazole-trimethoprim (BACTRIM DS,SEPTRA DS) 800-160 MG tablet Take 1 tablet by mouth 2 (two) times daily. (Patient not taking: Reported on 12/20/2015) 20 tablet 0  No facility-administered medications prior to visit.    ROS Review of Systems  Constitutional: Negative for fever, chills, fatigue and unexpected weight change.  Eyes: Negative for visual disturbance.  Respiratory: Negative for cough and shortness of breath.   Cardiovascular: Negative for chest pain, palpitations and leg swelling.  Gastrointestinal: Negative for nausea, vomiting, abdominal pain, diarrhea, constipation and blood in stool.  Endocrine: Negative for polydipsia, polyphagia and polyuria.  Musculoskeletal: Negative for myalgias, back pain, arthralgias, gait problem and neck pain.  Skin: Positive for wound and rash.  Allergic/Immunologic:  Negative for immunocompromised state.  Hematological: Negative for adenopathy. Does not bruise/bleed easily.  Psychiatric/Behavioral: Negative for suicidal ideas, sleep disturbance and dysphoric mood. The patient is not nervous/anxious.     Objective:  BP 109/66 mmHg  Pulse 71  Temp(Src) 98.1 F (36.7 C) (Oral)  Resp 16  Ht '5\' 7"'  (1.702 m)  Wt 113 lb (51.256 kg)  BMI 17.69 kg/m2  SpO2 99%  BP/Weight 12/20/2015 11/03/2692 06/02/4626  Systolic BP 035 009 381  Diastolic BP 66 80 76  Wt. (Lbs) 113 106 106  BMI 17.69 18.19 16.6   Wt Readings from Last 3 Encounters:  12/20/15 113 lb (51.256 kg)  12/19/15 106 lb (48.081 kg)  12/05/15 106 lb (48.081 kg)     Physical Exam  Constitutional: He appears well-developed and well-nourished. No distress.  HENT:  Head: Normocephalic and atraumatic.  Neck: Normal range of motion. Neck supple.  Cardiovascular: Normal rate, regular rhythm, normal heart sounds and intact distal pulses.    Pulmonary/Chest: Effort normal and breath sounds normal.  Abdominal: Soft. Bowel sounds are normal. He exhibits no distension and no mass. There is no tenderness. There is no rebound and no guarding.  Musculoskeletal: He exhibits no edema.  Neurological: He is alert.  Skin: Skin is warm and dry. There mildly erythematous macular rash on posterior scalp, neck and upper back. There is stage IV ulcer on L forearm. There is un-stageable ulcer on L anterior thigh. There is not tenderness, fluctuance or purulent drainage associated with either ulcer.      Lab Results  Component Value Date   HGBA1C 12.2* 11/11/2015   CBG 217 Assessment & Plan:  Brian Owen was seen today for hypertension.  Diagnoses and all orders for this visit:  Uncontrolled type 2 diabetes mellitus with hyperosmolarity without coma, with long-term current use of insulin (HCC) -     POCT glucose (manual entry) -     insulin NPH-regular Human (NOVOLIN 70/30) (70-30) 100 UNIT/ML injection; Inject  12 Units into the skin 2 (two) times daily with a meal.  Pruritic rash -     triamcinolone cream (KENALOG) 0.1 %; Apply 1 application topically 2 (two) times daily. -     Discontinue: hydrOXYzine (ATARAX/VISTARIL) 25 MG tablet; Take 1 tablet (25 mg total) by mouth every 6 (six) hours as needed for itching. -     hydrOXYzine (ATARAX/VISTARIL) 25 MG tablet; Take 1 tablet (25 mg total) by mouth every 6 (six) hours as needed for itching.  Arm wound, left, subsequent encounter   Follow-up: No Follow-up on file.   Brian Nearing MD

## 2015-12-20 NOTE — Patient Instructions (Addendum)
Brian Owen was seen today for hypertension.  Diagnoses and all orders for this visit:  Uncontrolled type 2 diabetes mellitus with hyperosmolarity without coma, with long-term current use of insulin (HCC) -     POCT glucose (manual entry) -     insulin NPH-regular Human (NOVOLIN 70/30) (70-30) 100 UNIT/ML injection; Inject 12 Units into the skin 2 (two) times daily with a meal.  Pruritic rash -     triamcinolone cream (KENALOG) 0.1 %; Apply 1 application topically 2 (two) times daily. -     hydrOXYzine (ATARAX/VISTARIL) 25 MG tablet; Take 1 tablet (25 mg total) by mouth every 6 (six) hours as needed for itching.   F/u in 4 weeks R arm wound,   Dr. Adrian Blackwater

## 2015-12-22 DIAGNOSIS — Z87891 Personal history of nicotine dependence: Secondary | ICD-10-CM | POA: Diagnosis not present

## 2015-12-22 DIAGNOSIS — S71102D Unspecified open wound, left thigh, subsequent encounter: Secondary | ICD-10-CM | POA: Diagnosis not present

## 2015-12-22 DIAGNOSIS — E43 Unspecified severe protein-calorie malnutrition: Secondary | ICD-10-CM | POA: Diagnosis not present

## 2015-12-22 DIAGNOSIS — S51802D Unspecified open wound of left forearm, subsequent encounter: Secondary | ICD-10-CM | POA: Diagnosis not present

## 2015-12-22 DIAGNOSIS — E1165 Type 2 diabetes mellitus with hyperglycemia: Secondary | ICD-10-CM | POA: Diagnosis not present

## 2015-12-22 DIAGNOSIS — I10 Essential (primary) hypertension: Secondary | ICD-10-CM | POA: Diagnosis not present

## 2015-12-23 ENCOUNTER — Encounter: Payer: Self-pay | Admitting: Clinical

## 2015-12-23 ENCOUNTER — Ambulatory Visit: Payer: Medicare Other | Admitting: Surgery

## 2015-12-23 NOTE — Progress Notes (Signed)
Depression screen [facility] 2/9 12/05/2015 07/25/2015 06/23/2015 08/18/2014 06/23/2014  Decreased Interest 1 0 3 0 0  Down, Depressed, Hopeless 1 0 0 0 0  PHQ - 2 Score 2 0 3 0 0  Altered sleeping 2 - 0 - -  Tired, decreased energy 3 - 1 - -  Change in appetite 3 - 0 - -  Feeling bad or failure about yourself  3 - 1 - -  Trouble concentrating 2 - 3 - -  Moving slowly or fidgety/restless 2 - 0 - -  Suicidal thoughts - - 1 - -  PHQ-9 Score 17 - - - -    GAD 7 : Generalized Anxiety Score 12/05/2015  Nervous, Anxious, on Edge 0  Control/stop worrying 0  Worry too much - different things 1  Trouble relaxing 0  Restless 3  Easily annoyed or irritable 0  Afraid - awful might happen 0  Total GAD 7 Score 4

## 2015-12-27 DIAGNOSIS — Z87891 Personal history of nicotine dependence: Secondary | ICD-10-CM | POA: Diagnosis not present

## 2015-12-27 DIAGNOSIS — S71102D Unspecified open wound, left thigh, subsequent encounter: Secondary | ICD-10-CM | POA: Diagnosis not present

## 2015-12-27 DIAGNOSIS — E1165 Type 2 diabetes mellitus with hyperglycemia: Secondary | ICD-10-CM | POA: Diagnosis not present

## 2015-12-27 DIAGNOSIS — S51802D Unspecified open wound of left forearm, subsequent encounter: Secondary | ICD-10-CM | POA: Diagnosis not present

## 2015-12-27 DIAGNOSIS — I10 Essential (primary) hypertension: Secondary | ICD-10-CM | POA: Diagnosis not present

## 2015-12-27 DIAGNOSIS — E43 Unspecified severe protein-calorie malnutrition: Secondary | ICD-10-CM | POA: Diagnosis not present

## 2015-12-28 ENCOUNTER — Ambulatory Visit: Payer: Medicare Other | Admitting: Endocrinology

## 2015-12-28 ENCOUNTER — Ambulatory Visit: Payer: Medicare Other | Admitting: Internal Medicine

## 2015-12-30 ENCOUNTER — Other Ambulatory Visit: Payer: Self-pay | Admitting: Internal Medicine

## 2015-12-30 ENCOUNTER — Encounter (HOSPITAL_BASED_OUTPATIENT_CLINIC_OR_DEPARTMENT_OTHER): Payer: Medicare Other | Attending: Internal Medicine

## 2015-12-30 DIAGNOSIS — L98491 Non-pressure chronic ulcer of skin of other sites limited to breakdown of skin: Secondary | ICD-10-CM | POA: Insufficient documentation

## 2015-12-30 DIAGNOSIS — S51802D Unspecified open wound of left forearm, subsequent encounter: Secondary | ICD-10-CM | POA: Diagnosis not present

## 2015-12-30 DIAGNOSIS — S71102D Unspecified open wound, left thigh, subsequent encounter: Secondary | ICD-10-CM | POA: Diagnosis not present

## 2015-12-30 DIAGNOSIS — Z87891 Personal history of nicotine dependence: Secondary | ICD-10-CM | POA: Insufficient documentation

## 2015-12-30 DIAGNOSIS — S21201A Unspecified open wound of right back wall of thorax without penetration into thoracic cavity, initial encounter: Secondary | ICD-10-CM | POA: Diagnosis not present

## 2015-12-30 DIAGNOSIS — L929 Granulomatous disorder of the skin and subcutaneous tissue, unspecified: Secondary | ICD-10-CM | POA: Diagnosis not present

## 2015-12-30 DIAGNOSIS — Z794 Long term (current) use of insulin: Secondary | ICD-10-CM | POA: Diagnosis not present

## 2015-12-30 DIAGNOSIS — L97121 Non-pressure chronic ulcer of left thigh limited to breakdown of skin: Secondary | ICD-10-CM | POA: Diagnosis not present

## 2015-12-30 DIAGNOSIS — X58XXXA Exposure to other specified factors, initial encounter: Secondary | ICD-10-CM | POA: Insufficient documentation

## 2015-12-30 DIAGNOSIS — F0390 Unspecified dementia without behavioral disturbance: Secondary | ICD-10-CM | POA: Diagnosis not present

## 2015-12-30 DIAGNOSIS — E1165 Type 2 diabetes mellitus with hyperglycemia: Secondary | ICD-10-CM | POA: Diagnosis not present

## 2015-12-30 DIAGNOSIS — S71132A Puncture wound without foreign body, left thigh, initial encounter: Secondary | ICD-10-CM | POA: Diagnosis not present

## 2015-12-30 DIAGNOSIS — I10 Essential (primary) hypertension: Secondary | ICD-10-CM | POA: Insufficient documentation

## 2015-12-30 DIAGNOSIS — E43 Unspecified severe protein-calorie malnutrition: Secondary | ICD-10-CM | POA: Diagnosis not present

## 2015-12-30 DIAGNOSIS — M7989 Other specified soft tissue disorders: Secondary | ICD-10-CM | POA: Diagnosis not present

## 2016-01-02 LAB — GLUCOSE, CAPILLARY: GLUCOSE-CAPILLARY: 347 mg/dL — AB (ref 65–99)

## 2016-01-03 DIAGNOSIS — S71102D Unspecified open wound, left thigh, subsequent encounter: Secondary | ICD-10-CM | POA: Diagnosis not present

## 2016-01-03 DIAGNOSIS — E1165 Type 2 diabetes mellitus with hyperglycemia: Secondary | ICD-10-CM | POA: Diagnosis not present

## 2016-01-03 DIAGNOSIS — S51802D Unspecified open wound of left forearm, subsequent encounter: Secondary | ICD-10-CM | POA: Diagnosis not present

## 2016-01-03 DIAGNOSIS — I10 Essential (primary) hypertension: Secondary | ICD-10-CM | POA: Diagnosis not present

## 2016-01-03 DIAGNOSIS — Z87891 Personal history of nicotine dependence: Secondary | ICD-10-CM | POA: Diagnosis not present

## 2016-01-03 DIAGNOSIS — E43 Unspecified severe protein-calorie malnutrition: Secondary | ICD-10-CM | POA: Diagnosis not present

## 2016-01-05 DIAGNOSIS — S71102D Unspecified open wound, left thigh, subsequent encounter: Secondary | ICD-10-CM | POA: Diagnosis not present

## 2016-01-05 DIAGNOSIS — Z87891 Personal history of nicotine dependence: Secondary | ICD-10-CM | POA: Diagnosis not present

## 2016-01-05 DIAGNOSIS — S51802D Unspecified open wound of left forearm, subsequent encounter: Secondary | ICD-10-CM | POA: Diagnosis not present

## 2016-01-05 DIAGNOSIS — E1165 Type 2 diabetes mellitus with hyperglycemia: Secondary | ICD-10-CM | POA: Diagnosis not present

## 2016-01-05 DIAGNOSIS — E43 Unspecified severe protein-calorie malnutrition: Secondary | ICD-10-CM | POA: Diagnosis not present

## 2016-01-05 DIAGNOSIS — I10 Essential (primary) hypertension: Secondary | ICD-10-CM | POA: Diagnosis not present

## 2016-01-06 DIAGNOSIS — L98491 Non-pressure chronic ulcer of skin of other sites limited to breakdown of skin: Secondary | ICD-10-CM | POA: Diagnosis not present

## 2016-01-06 DIAGNOSIS — I10 Essential (primary) hypertension: Secondary | ICD-10-CM | POA: Diagnosis not present

## 2016-01-06 DIAGNOSIS — F0390 Unspecified dementia without behavioral disturbance: Secondary | ICD-10-CM | POA: Diagnosis not present

## 2016-01-06 DIAGNOSIS — L97121 Non-pressure chronic ulcer of left thigh limited to breakdown of skin: Secondary | ICD-10-CM | POA: Diagnosis not present

## 2016-01-06 DIAGNOSIS — S71132A Puncture wound without foreign body, left thigh, initial encounter: Secondary | ICD-10-CM | POA: Diagnosis not present

## 2016-01-06 DIAGNOSIS — S21201A Unspecified open wound of right back wall of thorax without penetration into thoracic cavity, initial encounter: Secondary | ICD-10-CM | POA: Diagnosis not present

## 2016-01-06 DIAGNOSIS — Z87891 Personal history of nicotine dependence: Secondary | ICD-10-CM | POA: Diagnosis not present

## 2016-01-06 LAB — GLUCOSE, CAPILLARY: GLUCOSE-CAPILLARY: 74 mg/dL (ref 65–99)

## 2016-01-10 DIAGNOSIS — I10 Essential (primary) hypertension: Secondary | ICD-10-CM | POA: Diagnosis not present

## 2016-01-10 DIAGNOSIS — S71102D Unspecified open wound, left thigh, subsequent encounter: Secondary | ICD-10-CM | POA: Diagnosis not present

## 2016-01-10 DIAGNOSIS — S51802D Unspecified open wound of left forearm, subsequent encounter: Secondary | ICD-10-CM | POA: Diagnosis not present

## 2016-01-10 DIAGNOSIS — Z87891 Personal history of nicotine dependence: Secondary | ICD-10-CM | POA: Diagnosis not present

## 2016-01-10 DIAGNOSIS — E43 Unspecified severe protein-calorie malnutrition: Secondary | ICD-10-CM | POA: Diagnosis not present

## 2016-01-10 DIAGNOSIS — E1165 Type 2 diabetes mellitus with hyperglycemia: Secondary | ICD-10-CM | POA: Diagnosis not present

## 2016-01-12 ENCOUNTER — Encounter: Payer: Self-pay | Admitting: Clinical

## 2016-01-12 DIAGNOSIS — E1165 Type 2 diabetes mellitus with hyperglycemia: Secondary | ICD-10-CM | POA: Diagnosis not present

## 2016-01-12 DIAGNOSIS — E43 Unspecified severe protein-calorie malnutrition: Secondary | ICD-10-CM | POA: Diagnosis not present

## 2016-01-12 DIAGNOSIS — Z87891 Personal history of nicotine dependence: Secondary | ICD-10-CM | POA: Diagnosis not present

## 2016-01-12 DIAGNOSIS — S71102D Unspecified open wound, left thigh, subsequent encounter: Secondary | ICD-10-CM | POA: Diagnosis not present

## 2016-01-12 DIAGNOSIS — S51802D Unspecified open wound of left forearm, subsequent encounter: Secondary | ICD-10-CM | POA: Diagnosis not present

## 2016-01-12 DIAGNOSIS — I10 Essential (primary) hypertension: Secondary | ICD-10-CM | POA: Diagnosis not present

## 2016-01-12 NOTE — Progress Notes (Signed)
Received call from [facility] from [place] at [phone], concerning pt, Mr. Brian Owen. Brian Owen says that pt working with [facility] [employer] diabetic supply [facility] to receive diabetes supplies, including glucometer, test streps, insulin, etc., but that [facility] was unable to verify Brian Owen as pt at [facility]. Brian Owen says she will contact [facility], and have them send fax to [facility] to have PCP sign, to verify that he is a patient, so that they will send Brian Owen his diabetes supplies.

## 2016-01-13 ENCOUNTER — Encounter: Payer: Self-pay | Admitting: Family Medicine

## 2016-01-13 DIAGNOSIS — L97121 Non-pressure chronic ulcer of left thigh limited to breakdown of skin: Secondary | ICD-10-CM | POA: Diagnosis not present

## 2016-01-13 DIAGNOSIS — S71132A Puncture wound without foreign body, left thigh, initial encounter: Secondary | ICD-10-CM | POA: Diagnosis not present

## 2016-01-13 DIAGNOSIS — I10 Essential (primary) hypertension: Secondary | ICD-10-CM | POA: Diagnosis not present

## 2016-01-13 DIAGNOSIS — Z87891 Personal history of nicotine dependence: Secondary | ICD-10-CM | POA: Diagnosis not present

## 2016-01-13 DIAGNOSIS — F0390 Unspecified dementia without behavioral disturbance: Secondary | ICD-10-CM | POA: Diagnosis not present

## 2016-01-13 DIAGNOSIS — S21201A Unspecified open wound of right back wall of thorax without penetration into thoracic cavity, initial encounter: Secondary | ICD-10-CM | POA: Diagnosis not present

## 2016-01-13 DIAGNOSIS — L98491 Non-pressure chronic ulcer of skin of other sites limited to breakdown of skin: Secondary | ICD-10-CM | POA: Diagnosis not present

## 2016-01-13 LAB — GLUCOSE, CAPILLARY: Glucose-Capillary: 246 mg/dL — ABNORMAL HIGH (ref 65–99)

## 2016-01-13 NOTE — Progress Notes (Signed)
Patient ID: Brian Owen, male   DOB: 05-Jul-1951, 65 y.o.   MRN: [id number] Received patient request form from [facility] for diabetic testing supplies Form completed Form given to RMA to fax to [phone]

## 2016-01-16 DIAGNOSIS — S71102D Unspecified open wound, left thigh, subsequent encounter: Secondary | ICD-10-CM | POA: Diagnosis not present

## 2016-01-16 DIAGNOSIS — D649 Anemia, unspecified: Secondary | ICD-10-CM | POA: Diagnosis not present

## 2016-01-16 DIAGNOSIS — Z87891 Personal history of nicotine dependence: Secondary | ICD-10-CM | POA: Diagnosis not present

## 2016-01-16 DIAGNOSIS — S51802D Unspecified open wound of left forearm, subsequent encounter: Secondary | ICD-10-CM | POA: Diagnosis not present

## 2016-01-16 DIAGNOSIS — E1165 Type 2 diabetes mellitus with hyperglycemia: Secondary | ICD-10-CM | POA: Diagnosis not present

## 2016-01-16 DIAGNOSIS — Z794 Long term (current) use of insulin: Secondary | ICD-10-CM | POA: Diagnosis not present

## 2016-01-16 DIAGNOSIS — E43 Unspecified severe protein-calorie malnutrition: Secondary | ICD-10-CM | POA: Diagnosis not present

## 2016-01-16 DIAGNOSIS — E1143 Type 2 diabetes mellitus with diabetic autonomic (poly)neuropathy: Secondary | ICD-10-CM | POA: Diagnosis not present

## 2016-01-16 DIAGNOSIS — I10 Essential (primary) hypertension: Secondary | ICD-10-CM | POA: Diagnosis not present

## 2016-01-17 ENCOUNTER — Ambulatory Visit: Payer: Medicare Other | Attending: Family Medicine | Admitting: Family Medicine

## 2016-01-17 ENCOUNTER — Other Ambulatory Visit: Payer: Self-pay | Admitting: Family Medicine

## 2016-01-17 ENCOUNTER — Encounter: Payer: Self-pay | Admitting: Family Medicine

## 2016-01-17 VITALS — BP 100/65 | HR 89 | Temp 97.7°F | Resp 16 | Ht 67.0 in | Wt 108.0 lb

## 2016-01-17 DIAGNOSIS — E43 Unspecified severe protein-calorie malnutrition: Secondary | ICD-10-CM | POA: Diagnosis not present

## 2016-01-17 DIAGNOSIS — A09 Infectious gastroenteritis and colitis, unspecified: Secondary | ICD-10-CM

## 2016-01-17 DIAGNOSIS — R636 Underweight: Secondary | ICD-10-CM | POA: Diagnosis not present

## 2016-01-17 DIAGNOSIS — Z0000 Encounter for general adult medical examination without abnormal findings: Secondary | ICD-10-CM

## 2016-01-17 DIAGNOSIS — S51802D Unspecified open wound of left forearm, subsequent encounter: Secondary | ICD-10-CM | POA: Diagnosis not present

## 2016-01-17 DIAGNOSIS — L97129 Non-pressure chronic ulcer of left thigh with unspecified severity: Secondary | ICD-10-CM | POA: Insufficient documentation

## 2016-01-17 DIAGNOSIS — D649 Anemia, unspecified: Secondary | ICD-10-CM | POA: Diagnosis not present

## 2016-01-17 DIAGNOSIS — E11622 Type 2 diabetes mellitus with other skin ulcer: Secondary | ICD-10-CM | POA: Insufficient documentation

## 2016-01-17 DIAGNOSIS — E1100 Type 2 diabetes mellitus with hyperosmolarity without nonketotic hyperglycemic-hyperosmolar coma (NKHHC): Secondary | ICD-10-CM | POA: Diagnosis not present

## 2016-01-17 DIAGNOSIS — Z1159 Encounter for screening for other viral diseases: Secondary | ICD-10-CM | POA: Diagnosis not present

## 2016-01-17 DIAGNOSIS — S71102D Unspecified open wound, left thigh, subsequent encounter: Secondary | ICD-10-CM | POA: Diagnosis not present

## 2016-01-17 DIAGNOSIS — R197 Diarrhea, unspecified: Secondary | ICD-10-CM | POA: Diagnosis not present

## 2016-01-17 DIAGNOSIS — E1165 Type 2 diabetes mellitus with hyperglycemia: Secondary | ICD-10-CM | POA: Diagnosis not present

## 2016-01-17 DIAGNOSIS — L98499 Non-pressure chronic ulcer of skin of other sites with unspecified severity: Secondary | ICD-10-CM | POA: Diagnosis not present

## 2016-01-17 DIAGNOSIS — R531 Weakness: Secondary | ICD-10-CM | POA: Diagnosis not present

## 2016-01-17 DIAGNOSIS — Z681 Body mass index (BMI) 19 or less, adult: Secondary | ICD-10-CM | POA: Insufficient documentation

## 2016-01-17 DIAGNOSIS — Z87891 Personal history of nicotine dependence: Secondary | ICD-10-CM | POA: Insufficient documentation

## 2016-01-17 DIAGNOSIS — Z794 Long term (current) use of insulin: Secondary | ICD-10-CM | POA: Insufficient documentation

## 2016-01-17 DIAGNOSIS — Z7982 Long term (current) use of aspirin: Secondary | ICD-10-CM | POA: Insufficient documentation

## 2016-01-17 DIAGNOSIS — E1143 Type 2 diabetes mellitus with diabetic autonomic (poly)neuropathy: Secondary | ICD-10-CM | POA: Diagnosis not present

## 2016-01-17 LAB — COMPLETE METABOLIC PANEL WITH GFR
ALBUMIN: 3.4 g/dL — AB (ref 3.6–5.1)
ALK PHOS: 122 U/L — AB (ref 40–115)
ALT: 15 U/L (ref 9–46)
AST: 15 U/L (ref 10–35)
BILIRUBIN TOTAL: 0.2 mg/dL (ref 0.2–1.2)
BUN: 16 mg/dL (ref 7–25)
CO2: 24 mmol/L (ref 20–31)
Calcium: 9 mg/dL (ref 8.6–10.3)
Chloride: 101 mmol/L (ref 98–110)
Creat: 1.28 mg/dL — ABNORMAL HIGH (ref 0.70–1.25)
GFR, EST AFRICAN AMERICAN: 67 mL/min (ref >=60)
GFR, EST NON AFRICAN AMERICAN: 58 mL/min — AB (ref >=60)
GLUCOSE: 259 mg/dL — AB (ref 65–99)
Potassium: 4.2 mmol/L (ref 3.5–5.3)
Sodium: 136 mmol/L (ref 135–146)
TOTAL PROTEIN: 8.1 g/dL (ref 6.1–8.1)

## 2016-01-17 LAB — CBC
HEMATOCRIT: 30.4 % — AB (ref 39.0–52.0)
HEMOGLOBIN: 10 g/dL — AB (ref 13.0–17.0)
MCH: 29.2 pg (ref 26.0–34.0)
MCHC: 32.9 g/dL (ref 30.0–36.0)
MCV: 88.6 fL (ref 78.0–100.0)
MPV: 9.2 fL (ref 8.6–12.4)
Platelets: 233 10*3/uL (ref 150–400)
RBC: 3.43 MIL/uL — AB (ref 4.22–5.81)
RDW: 15.1 % (ref 11.5–15.5)
WBC: 9.8 10*3/uL (ref 4.0–10.5)

## 2016-01-17 LAB — GLUCOSE, POCT (MANUAL RESULT ENTRY): POC Glucose: 241 mg/dl — AB (ref 70–99)

## 2016-01-17 NOTE — Progress Notes (Signed)
Patient ID: Brian Owen, male   DOB: [birth year], 65 y.o.   MRN: [id number]   Subjective:  Patient ID: Brian Owen, male    DOB: 02/13/1951  Age: 65 y.o. MRN: [id number] Brian Owen interpreter used  CC: Ulcer   HPI Brian Owen has uncontrolled diabetes, he presents with his daughter   1. Ulcers: improving. Being treated in wound care. No pain.   2. Weakness: having generalized weakness. He is no longer taking metoprolol. He fell once while using the restroom. Losing weight. Having diarrhea every now then. No fever, chills, nausea or emesis. He is being served a low salt and low protein diet.   Social History  Substance Use Topics  . Smoking status: Former Smoker -- 0.25 packs/day for 48 years    Types: Cigarettes    Quit date: 04/28/2014  . Smokeless tobacco: Never Used  . Alcohol Use: No    Outpatient Prescriptions Prior to Visit  Medication Sig Dispense Refill  . ACCU-CHEK SOFTCLIX LANCETS lancets 1 each by Other route 3 (three) times daily. E11.65 100 each 12  . acetaminophen (TYLENOL) 500 MG tablet Take 500 mg by mouth every 6 (six) hours as needed for moderate pain or headache.    . Amino Acids-Protein Hydrolys (FEEDING SUPPLEMENT, PRO-STAT SUGAR FREE 64,) LIQD Take 30 mLs by mouth 3 (three) times daily with meals. 900 mL 0  . aspirin EC 81 MG EC tablet Take 1 tablet (81 mg total) by mouth daily. 90 tablet 0  . Blood Glucose Monitoring Suppl (ACCU-CHEK AVIVA PLUS) w/Device KIT 1 Device by Does not apply route 3 (three) times daily after meals. E11.65 1 kit 0  . glucose blood (ACCU-CHEK AVIVA PLUS) test strip 1 each by Other route 3 (three) times daily. E11.65 100 each 12  . hydrOXYzine (ATARAX/VISTARIL) 25 MG tablet Take 1 tablet (25 mg total) by mouth every 6 (six) hours as needed for itching. 12 tablet 0  . insulin NPH-regular Human (NOVOLIN 70/30) (70-30) 100 UNIT/ML injection Inject 12 Units into the skin 2 (two) times daily with a meal. 10 mL 2  . metoprolol tartrate (LOPRESSOR) 25  MG tablet Take 25 mg by mouth 2 (two) times daily.  0  . triamcinolone cream (KENALOG) 0.1 % Apply 1 application topically 2 (two) times daily. 30 g 0   No facility-administered medications prior to visit.    ROS Review of Systems  Constitutional:  Positive for weakness. Negative for fever, chills, fatigue and unexpected weight change.  Eyes: Negative for visual disturbance.  Respiratory: Negative for cough and shortness of breath.   Cardiovascular: Negative for chest pain, palpitations and leg swelling.  Gastrointestinal: Negative for nausea, vomiting, abdominal pain, diarrhea, constipation and blood in stool.  Endocrine: Negative for polydipsia, polyphagia and polyuria.  Musculoskeletal: Negative for myalgias, back pain, arthralgias, gait problem and neck pain.  Skin: Positive for wound.  Allergic/Immunologic: Negative for immunocompromised state.  Hematological: Negative for adenopathy. Does not bruise/bleed easily.  Psychiatric/Behavioral: Negative for suicidal ideas, sleep disturbance and dysphoric mood. The patient is not nervous/anxious.     Objective:  BP 100/65 mmHg  Pulse 89  Temp(Src) 97.7 F (36.5 C) (Oral)  Resp 16  Ht '5\' 7"'  (1.702 m)  Wt 108 lb (48.988 kg)  BMI 16.91 kg/m2  SpO2 100%  BP/Weight 01/17/2016 12/20/2015 04/26/4764  Systolic BP 465 035 465  Diastolic BP 65 66 80  Wt. (Lbs) 108 113 106  BMI 16.91 17.69 18.19   Wt Readings from Last 3 Encounters:  01/17/16 108 lb (48.988 kg)  12/20/15 113 lb (51.256 kg)  12/19/15 106 lb (48.081 kg)   Physical Exam  Constitutional: He appears well-developed and well-nourished. No distress.  HENT:  Head: Normocephalic and atraumatic.  Neck: Normal range of motion. Neck supple.  Cardiovascular: Normal rate, regular rhythm, normal heart sounds and intact distal pulses.    Pulmonary/Chest: Effort normal and breath sounds normal.  Abdominal: Soft. Bowel sounds are normal. He exhibits no distension and no mass. There  is no tenderness. There is no rebound and no guarding.  Musculoskeletal: He exhibits no edema.  Neurological: He is alert.  Skin: Skin is warm and dry. Shallow ulcers on L forearm and L anterior thigh.      Lab Results  Component Value Date   HGBA1C 12.2* 11/11/2015   CBG 241 Assessment & Plan:  [name] was seen today for ulcer.  Diagnoses and all orders for this visit:  Uncontrolled type 2 diabetes mellitus with hyperosmolarity without coma, with long-term current use of insulin (HCC) -     Glucose (CBG)  Weakness -     CBC -     COMPLETE METABOLIC PANEL WITH GFR -     Home Health -     Face-to-face encounter (required for Medicare/Medicaid patients)  Diarrhea of presumed infectious origin -     Cancel: Clostridium difficile culture-fecal; Future  Healthcare maintenance -     Ambulatory referral to Gastroenterology  Need for hepatitis C screening test -     Hepatitis C antibody, reflex  Underweight   Follow-up: No Follow-up on file.   Boykin Nearing MD

## 2016-01-17 NOTE — Assessment & Plan Note (Signed)
A; generalized weakness and losing weight  P: Increase protein in diet  Increase salt in diet given low BP Home PT ordered

## 2016-01-17 NOTE — Progress Notes (Signed)
Using pacific interpreter [place] # [phone] F/U wound in arm  No pain today  No tobacco user  No suicidal thoughts in the past two weeks

## 2016-01-17 NOTE — Patient Instructions (Addendum)
Brian Owen was seen today for ulcer.  Diagnoses and all orders for this visit:  Uncontrolled type 2 diabetes mellitus with hyperosmolarity without coma, with long-term current use of insulin (HCC) -     Glucose (CBG)  Weakness -     CBC -     COMPLETE METABOLIC PANEL WITH GFR -     Home Health -     Face-to-face encounter (required for Medicare/Medicaid patients)  Diarrhea of presumed infectious origin -     Cancel: Clostridium difficile culture-fecal; Future  Healthcare maintenance -     Ambulatory referral to Gastroenterology  Need for hepatitis C screening test -     Hepatitis C antibody, reflex  Underweight  Increase intake of salt by drinking gatorade or having 1-2 cans of soup daily  Increase protien  Increase calories to 3000 mg daily   F/u in 6 weeks A1c and weight check   Dr. Adrian Blackwater   High-Protein and High-Calorie Diet Eating high-protein and high-calorie foods can help you to gain weight, heal after an injury, and recover after an illness or surgery.  WHAT IS MY PLAN? The specific amount of daily protein and calories you need depends on:  Your body weight.  The reason this diet is recommended for you. Generally, a high-protein, high-calorie diet involves:   Eating 250-500 extra calories each day.  Making sure that 10-35% of your daily calories come from protein. Talk to your health care provider about how much protein and how many calories you need each day. Follow the diet as directed by your health care provider.  WHAT DO I NEED TO KNOW ABOUT THIS DIET?  Ask your health care provider if you should take a nutritional supplement.   Try to eat six small meals each day instead of three large meals.   Eat a balanced diet, including one food that is high in protein at each meal.  Keep nutritious snacks handy, such as nuts, trail mixes, dried fruit, and yogurt.   If you have kidney disease or diabetes, eating too much protein may put extra stress on your  kidneys. Talk to your health care provider if you have either of those conditions. WHAT ARE SOME HIGH-PROTEIN FOODS? Grains Quinoa. Bulgur wheat. Vegetables Soybeans. Peas. Meats and Other Protein Sources Beef, pork, and poultry. Fish and seafood. Eggs. Tofu. Textured vegetable protein (TVP). Peanut butter. Nuts and seeds. Dried beans. Protein powders. Dairy Whole milk. Whole-milk yogurt. Powdered milk. Cheese. Yahoo. Eggnog. Beverages High-protein supplement drinks. Soy milk. Other Protein bars. The items listed above may not be a complete list of recommended foods or beverages. Contact your dietitian for more options. WHAT ARE SOME HIGH-CALORIE FOODS? Grains Pasta. Quick breads. Muffins. Pancakes. Ready-to-eat cereal. Vegetables Vegetables cooked in oil or butter. Fried potatoes. Fruits Dried fruit. Fruit leather. Canned fruit in syrup. Fruit juice. Avocados. Meats and Other Protein Sources Peanut butter. Nuts and seeds. Dairy Heavy cream. Whipped cream. Cream cheese. Sour cream. Ice cream. Custard. Pudding. Beverages Meal-replacement beverages. Nutrition shakes. Fruit juice. Sugar-sweetened soft drinks. Condiments Salad dressing. Mayonnaise. Alfredo sauce. Fruit preserves or jelly. Honey. Syrup. Sweets/Desserts Cake. Cookies. Pie. Pastries. Candy bars. Chocolate. Fats and Oils Butter or margarine. Oil. Gravy. Other Meal-replacement bars. The items listed above may not be a complete list of recommended foods or beverages. Contact your dietitian for more options. WHAT ARE SOME TIPS FOR INCLUDING HIGH-PROTEIN AND HIGH-CALORIE FOODS IN MY DIET?  Add whole milk, half-and-half, or heavy cream to cereal, pudding, soup, or hot  cocoa.  Add whole milk to instant breakfast drinks.  Add peanut butter to oatmeal or smoothies.  Add powdered milk to baked goods, smoothies, or milkshakes.  Add powdered milk, cream, or butter to mashed potatoes.  Add cheese to  cooked vegetables.  Make whole-milk yogurt parfaits. Top them with granola, fruit, or nuts.  Add cottage cheese to your fruit.  Add avocados, cheese, or both to sandwiches or salads.  Add meat, poultry, or seafood to rice, pasta, casseroles, salads, and soups.   Use mayonnaise when making egg salad, chicken salad, or tuna salad.  Use peanut butter as a topping for pretzels, celery, or crackers.  Add beans to casseroles, dips, and spreads.  Add pureed beans to sauces and soups.  Replace calorie-free drinks with calorie-containing drinks, such as milk and fruit juice.   This information is not intended to replace advice given to you by your health care provider. Make sure you discuss any questions you have with your health care provider.   Document Released: 10/15/2005 Document Revised: 11/05/2014 Document Reviewed: 03/30/2014 Elsevier Interactive Patient Education [employer].

## 2016-01-18 LAB — HEPATITIS C ANTIBODY: HCV AB: NEGATIVE

## 2016-01-19 ENCOUNTER — Telehealth: Payer: Self-pay | Admitting: *Deleted

## 2016-01-19 DIAGNOSIS — S51802D Unspecified open wound of left forearm, subsequent encounter: Secondary | ICD-10-CM | POA: Diagnosis not present

## 2016-01-19 DIAGNOSIS — E1143 Type 2 diabetes mellitus with diabetic autonomic (poly)neuropathy: Secondary | ICD-10-CM | POA: Diagnosis not present

## 2016-01-19 DIAGNOSIS — D649 Anemia, unspecified: Secondary | ICD-10-CM | POA: Diagnosis not present

## 2016-01-19 DIAGNOSIS — S71102D Unspecified open wound, left thigh, subsequent encounter: Secondary | ICD-10-CM | POA: Diagnosis not present

## 2016-01-19 DIAGNOSIS — E43 Unspecified severe protein-calorie malnutrition: Secondary | ICD-10-CM | POA: Diagnosis not present

## 2016-01-19 DIAGNOSIS — E1165 Type 2 diabetes mellitus with hyperglycemia: Secondary | ICD-10-CM | POA: Diagnosis not present

## 2016-01-19 NOTE — Telephone Encounter (Signed)
-----   Message from Boykin Nearing, MD sent at 01/19/2016 10:29 AM EDT ----- Screening hep C negative

## 2016-01-19 NOTE — Telephone Encounter (Signed)
Date of birth verified by pt  Lab results given Pt verbalized understanding 

## 2016-01-23 DIAGNOSIS — L98491 Non-pressure chronic ulcer of skin of other sites limited to breakdown of skin: Secondary | ICD-10-CM | POA: Diagnosis not present

## 2016-01-23 DIAGNOSIS — F0390 Unspecified dementia without behavioral disturbance: Secondary | ICD-10-CM | POA: Diagnosis not present

## 2016-01-23 DIAGNOSIS — Z87891 Personal history of nicotine dependence: Secondary | ICD-10-CM | POA: Diagnosis not present

## 2016-01-23 DIAGNOSIS — S71102A Unspecified open wound, left thigh, initial encounter: Secondary | ICD-10-CM | POA: Diagnosis not present

## 2016-01-23 DIAGNOSIS — S21201A Unspecified open wound of right back wall of thorax without penetration into thoracic cavity, initial encounter: Secondary | ICD-10-CM | POA: Diagnosis not present

## 2016-01-23 DIAGNOSIS — L97121 Non-pressure chronic ulcer of left thigh limited to breakdown of skin: Secondary | ICD-10-CM | POA: Diagnosis not present

## 2016-01-23 DIAGNOSIS — I10 Essential (primary) hypertension: Secondary | ICD-10-CM | POA: Diagnosis not present

## 2016-01-24 DIAGNOSIS — E43 Unspecified severe protein-calorie malnutrition: Secondary | ICD-10-CM | POA: Diagnosis not present

## 2016-01-24 DIAGNOSIS — E1143 Type 2 diabetes mellitus with diabetic autonomic (poly)neuropathy: Secondary | ICD-10-CM | POA: Diagnosis not present

## 2016-01-24 DIAGNOSIS — D649 Anemia, unspecified: Secondary | ICD-10-CM | POA: Diagnosis not present

## 2016-01-24 DIAGNOSIS — S51802D Unspecified open wound of left forearm, subsequent encounter: Secondary | ICD-10-CM | POA: Diagnosis not present

## 2016-01-24 DIAGNOSIS — S71102D Unspecified open wound, left thigh, subsequent encounter: Secondary | ICD-10-CM | POA: Diagnosis not present

## 2016-01-24 DIAGNOSIS — E1165 Type 2 diabetes mellitus with hyperglycemia: Secondary | ICD-10-CM | POA: Diagnosis not present

## 2016-01-26 ENCOUNTER — Telehealth: Payer: Self-pay | Admitting: *Deleted

## 2016-01-26 NOTE — Telephone Encounter (Signed)
[employer] [phone] Date of birth verified by pt  Lab results given  Pt verbalized understanding

## 2016-01-26 NOTE — Telephone Encounter (Signed)
-----   Message from Boykin Nearing, MD sent at 01/19/2016  1:15 PM EDT ----- Stale CMP With stable Cr Low albumin Sugar is elevated, known diabetes with up and down (labile) CBGs

## 2016-01-28 DIAGNOSIS — S51802D Unspecified open wound of left forearm, subsequent encounter: Secondary | ICD-10-CM | POA: Diagnosis not present

## 2016-01-28 DIAGNOSIS — E1143 Type 2 diabetes mellitus with diabetic autonomic (poly)neuropathy: Secondary | ICD-10-CM | POA: Diagnosis not present

## 2016-01-28 DIAGNOSIS — E43 Unspecified severe protein-calorie malnutrition: Secondary | ICD-10-CM | POA: Diagnosis not present

## 2016-01-28 DIAGNOSIS — D649 Anemia, unspecified: Secondary | ICD-10-CM | POA: Diagnosis not present

## 2016-01-28 DIAGNOSIS — S71102D Unspecified open wound, left thigh, subsequent encounter: Secondary | ICD-10-CM | POA: Diagnosis not present

## 2016-01-28 DIAGNOSIS — E1165 Type 2 diabetes mellitus with hyperglycemia: Secondary | ICD-10-CM | POA: Diagnosis not present

## 2016-01-30 ENCOUNTER — Encounter (HOSPITAL_BASED_OUTPATIENT_CLINIC_OR_DEPARTMENT_OTHER): Payer: Medicare Other | Attending: Internal Medicine

## 2016-01-30 DIAGNOSIS — S51802A Unspecified open wound of left forearm, initial encounter: Secondary | ICD-10-CM | POA: Diagnosis not present

## 2016-01-30 DIAGNOSIS — X58XXXA Exposure to other specified factors, initial encounter: Secondary | ICD-10-CM | POA: Diagnosis not present

## 2016-01-30 DIAGNOSIS — I10 Essential (primary) hypertension: Secondary | ICD-10-CM | POA: Diagnosis not present

## 2016-01-30 DIAGNOSIS — E11622 Type 2 diabetes mellitus with other skin ulcer: Secondary | ICD-10-CM | POA: Diagnosis not present

## 2016-01-30 DIAGNOSIS — S41102A Unspecified open wound of left upper arm, initial encounter: Secondary | ICD-10-CM | POA: Insufficient documentation

## 2016-01-30 DIAGNOSIS — F0390 Unspecified dementia without behavioral disturbance: Secondary | ICD-10-CM | POA: Diagnosis not present

## 2016-01-30 DIAGNOSIS — S71102A Unspecified open wound, left thigh, initial encounter: Secondary | ICD-10-CM | POA: Diagnosis not present

## 2016-01-30 DIAGNOSIS — L97121 Non-pressure chronic ulcer of left thigh limited to breakdown of skin: Secondary | ICD-10-CM | POA: Diagnosis not present

## 2016-02-01 DIAGNOSIS — S71102D Unspecified open wound, left thigh, subsequent encounter: Secondary | ICD-10-CM | POA: Diagnosis not present

## 2016-02-01 DIAGNOSIS — D649 Anemia, unspecified: Secondary | ICD-10-CM | POA: Diagnosis not present

## 2016-02-01 DIAGNOSIS — E1165 Type 2 diabetes mellitus with hyperglycemia: Secondary | ICD-10-CM | POA: Diagnosis not present

## 2016-02-01 DIAGNOSIS — E1143 Type 2 diabetes mellitus with diabetic autonomic (poly)neuropathy: Secondary | ICD-10-CM | POA: Diagnosis not present

## 2016-02-01 DIAGNOSIS — S51802D Unspecified open wound of left forearm, subsequent encounter: Secondary | ICD-10-CM | POA: Diagnosis not present

## 2016-02-01 DIAGNOSIS — E43 Unspecified severe protein-calorie malnutrition: Secondary | ICD-10-CM | POA: Diagnosis not present

## 2016-02-03 DIAGNOSIS — D649 Anemia, unspecified: Secondary | ICD-10-CM | POA: Diagnosis not present

## 2016-02-03 DIAGNOSIS — S71102D Unspecified open wound, left thigh, subsequent encounter: Secondary | ICD-10-CM | POA: Diagnosis not present

## 2016-02-03 DIAGNOSIS — S51802D Unspecified open wound of left forearm, subsequent encounter: Secondary | ICD-10-CM | POA: Diagnosis not present

## 2016-02-03 DIAGNOSIS — E43 Unspecified severe protein-calorie malnutrition: Secondary | ICD-10-CM | POA: Diagnosis not present

## 2016-02-03 DIAGNOSIS — E1165 Type 2 diabetes mellitus with hyperglycemia: Secondary | ICD-10-CM | POA: Diagnosis not present

## 2016-02-03 DIAGNOSIS — E1143 Type 2 diabetes mellitus with diabetic autonomic (poly)neuropathy: Secondary | ICD-10-CM | POA: Diagnosis not present

## 2016-02-06 DIAGNOSIS — S51802A Unspecified open wound of left forearm, initial encounter: Secondary | ICD-10-CM | POA: Diagnosis not present

## 2016-02-06 DIAGNOSIS — I10 Essential (primary) hypertension: Secondary | ICD-10-CM | POA: Diagnosis not present

## 2016-02-06 DIAGNOSIS — S71102A Unspecified open wound, left thigh, initial encounter: Secondary | ICD-10-CM | POA: Diagnosis not present

## 2016-02-06 DIAGNOSIS — E11622 Type 2 diabetes mellitus with other skin ulcer: Secondary | ICD-10-CM | POA: Diagnosis not present

## 2016-02-06 DIAGNOSIS — L97121 Non-pressure chronic ulcer of left thigh limited to breakdown of skin: Secondary | ICD-10-CM | POA: Diagnosis not present

## 2016-02-06 DIAGNOSIS — S41102A Unspecified open wound of left upper arm, initial encounter: Secondary | ICD-10-CM | POA: Diagnosis not present

## 2016-02-06 DIAGNOSIS — F0390 Unspecified dementia without behavioral disturbance: Secondary | ICD-10-CM | POA: Diagnosis not present

## 2016-02-07 ENCOUNTER — Telehealth: Payer: Self-pay | Admitting: Family Medicine

## 2016-02-07 DIAGNOSIS — E1143 Type 2 diabetes mellitus with diabetic autonomic (poly)neuropathy: Secondary | ICD-10-CM | POA: Diagnosis not present

## 2016-02-07 DIAGNOSIS — D649 Anemia, unspecified: Secondary | ICD-10-CM | POA: Diagnosis not present

## 2016-02-07 DIAGNOSIS — R197 Diarrhea, unspecified: Secondary | ICD-10-CM

## 2016-02-07 DIAGNOSIS — S51802D Unspecified open wound of left forearm, subsequent encounter: Secondary | ICD-10-CM | POA: Diagnosis not present

## 2016-02-07 DIAGNOSIS — E1100 Type 2 diabetes mellitus with hyperosmolarity without nonketotic hyperglycemic-hyperosmolar coma (NKHHC): Secondary | ICD-10-CM

## 2016-02-07 DIAGNOSIS — E1165 Type 2 diabetes mellitus with hyperglycemia: Secondary | ICD-10-CM | POA: Diagnosis not present

## 2016-02-07 DIAGNOSIS — E43 Unspecified severe protein-calorie malnutrition: Secondary | ICD-10-CM | POA: Diagnosis not present

## 2016-02-07 DIAGNOSIS — S71102D Unspecified open wound, left thigh, subsequent encounter: Secondary | ICD-10-CM | POA: Diagnosis not present

## 2016-02-07 DIAGNOSIS — Z794 Long term (current) use of insulin: Secondary | ICD-10-CM

## 2016-02-07 NOTE — Telephone Encounter (Signed)
Nurse from [facility]:  BP sitting 92/60, BP standing 80/56, no dizziness Patient is still experiencing bowel movement issues Blood sugar reading 200  Please advised

## 2016-02-08 MED ORDER — LOPERAMIDE HCL 1 MG/5ML PO LIQD: 1.0000 mg | ORAL | Status: DC | PRN

## 2016-02-08 MED ORDER — INSULIN NPH ISOPHANE & REGULAR (70-30) 100 UNIT/ML ~~LOC~~ SUSP: 14.0000 [IU] | Freq: Two times a day (BID) | SUBCUTANEOUS | Status: DC

## 2016-02-08 NOTE — Telephone Encounter (Signed)
Please call back to nurse With the following instructions:   1. Ensure patient is not taking any BP medication. He should not be on any as they have been discontinued in the recent past. Please compare nurses med list with the med list in the chart to ensure there are no differences.   2. immodium ordered today and sent to his pharmacy to use as needed for diarrhea. He has already been referred to GI, referral placed last month.   3. Please increase novolin 70/30 to 14 U BID up from 12 U BID and monitor closely as he has had symptomatic hypoglycemia in the past.

## 2016-02-09 DIAGNOSIS — E1143 Type 2 diabetes mellitus with diabetic autonomic (poly)neuropathy: Secondary | ICD-10-CM | POA: Diagnosis not present

## 2016-02-09 DIAGNOSIS — E43 Unspecified severe protein-calorie malnutrition: Secondary | ICD-10-CM | POA: Diagnosis not present

## 2016-02-09 DIAGNOSIS — S51802D Unspecified open wound of left forearm, subsequent encounter: Secondary | ICD-10-CM | POA: Diagnosis not present

## 2016-02-09 DIAGNOSIS — D649 Anemia, unspecified: Secondary | ICD-10-CM | POA: Diagnosis not present

## 2016-02-09 DIAGNOSIS — E1165 Type 2 diabetes mellitus with hyperglycemia: Secondary | ICD-10-CM | POA: Diagnosis not present

## 2016-02-09 DIAGNOSIS — S71102D Unspecified open wound, left thigh, subsequent encounter: Secondary | ICD-10-CM | POA: Diagnosis not present

## 2016-02-15 DIAGNOSIS — D649 Anemia, unspecified: Secondary | ICD-10-CM | POA: Diagnosis not present

## 2016-02-15 DIAGNOSIS — E43 Unspecified severe protein-calorie malnutrition: Secondary | ICD-10-CM | POA: Diagnosis not present

## 2016-02-15 DIAGNOSIS — S51802D Unspecified open wound of left forearm, subsequent encounter: Secondary | ICD-10-CM | POA: Diagnosis not present

## 2016-02-15 DIAGNOSIS — E1143 Type 2 diabetes mellitus with diabetic autonomic (poly)neuropathy: Secondary | ICD-10-CM | POA: Diagnosis not present

## 2016-02-15 DIAGNOSIS — S71102D Unspecified open wound, left thigh, subsequent encounter: Secondary | ICD-10-CM | POA: Diagnosis not present

## 2016-02-15 DIAGNOSIS — E1165 Type 2 diabetes mellitus with hyperglycemia: Secondary | ICD-10-CM | POA: Diagnosis not present

## 2016-02-17 DIAGNOSIS — E43 Unspecified severe protein-calorie malnutrition: Secondary | ICD-10-CM | POA: Diagnosis not present

## 2016-02-17 DIAGNOSIS — S71102D Unspecified open wound, left thigh, subsequent encounter: Secondary | ICD-10-CM | POA: Diagnosis not present

## 2016-02-17 DIAGNOSIS — D649 Anemia, unspecified: Secondary | ICD-10-CM | POA: Diagnosis not present

## 2016-02-17 DIAGNOSIS — E1143 Type 2 diabetes mellitus with diabetic autonomic (poly)neuropathy: Secondary | ICD-10-CM | POA: Diagnosis not present

## 2016-02-17 DIAGNOSIS — E1165 Type 2 diabetes mellitus with hyperglycemia: Secondary | ICD-10-CM | POA: Diagnosis not present

## 2016-02-17 DIAGNOSIS — S51802D Unspecified open wound of left forearm, subsequent encounter: Secondary | ICD-10-CM | POA: Diagnosis not present

## 2016-02-17 NOTE — Telephone Encounter (Signed)
Kisha from [facility] called stating that the pt. Brian Owen and that his DM was 232 in the morning and last Monday it was 283. She stated pt. DM is not coming done. Please f/u

## 2016-02-17 NOTE — Telephone Encounter (Signed)
LVM to return call.

## 2016-02-18 ENCOUNTER — Encounter (HOSPITAL_COMMUNITY): Payer: Self-pay | Admitting: Emergency Medicine

## 2016-02-18 ENCOUNTER — Emergency Department (HOSPITAL_COMMUNITY)
Admission: EM | Admit: 2016-02-18 | Discharge: 2016-02-18 | Disposition: A | Discharge status: Home / Self Care | Payer: Medicare Other | Attending: Emergency Medicine | Admitting: Emergency Medicine

## 2016-02-18 DIAGNOSIS — Z7952 Long term (current) use of systemic steroids: Secondary | ICD-10-CM | POA: Diagnosis not present

## 2016-02-18 DIAGNOSIS — Z79899 Other long term (current) drug therapy: Secondary | ICD-10-CM | POA: Insufficient documentation

## 2016-02-18 DIAGNOSIS — Z872 Personal history of diseases of the skin and subcutaneous tissue: Secondary | ICD-10-CM | POA: Insufficient documentation

## 2016-02-18 DIAGNOSIS — Z87891 Personal history of nicotine dependence: Secondary | ICD-10-CM | POA: Insufficient documentation

## 2016-02-18 DIAGNOSIS — Z7982 Long term (current) use of aspirin: Secondary | ICD-10-CM | POA: Insufficient documentation

## 2016-02-18 DIAGNOSIS — E162 Hypoglycemia, unspecified: Secondary | ICD-10-CM

## 2016-02-18 DIAGNOSIS — E161 Other hypoglycemia: Secondary | ICD-10-CM | POA: Diagnosis not present

## 2016-02-18 DIAGNOSIS — E11649 Type 2 diabetes mellitus with hypoglycemia without coma: Secondary | ICD-10-CM | POA: Diagnosis not present

## 2016-02-18 DIAGNOSIS — Z794 Long term (current) use of insulin: Secondary | ICD-10-CM | POA: Insufficient documentation

## 2016-02-18 DIAGNOSIS — R42 Dizziness and giddiness: Secondary | ICD-10-CM | POA: Diagnosis not present

## 2016-02-18 LAB — I-STAT CHEM 8, ED
BUN: 30 mg/dL — AB (ref 6–20)
CALCIUM ION: 1.24 mmol/L (ref 1.13–1.30)
CHLORIDE: 107 mmol/L (ref 101–111)
CREATININE: 1.4 mg/dL — AB (ref 0.61–1.24)
GLUCOSE: 137 mg/dL — AB (ref 65–99)
HCT: 41 % (ref 39.0–52.0)
Hemoglobin: 13.9 g/dL (ref 13.0–17.0)
Potassium: 3.3 mmol/L — ABNORMAL LOW (ref 3.5–5.1)
Sodium: 145 mmol/L (ref 135–145)
TCO2: 23 mmol/L (ref 0–100)

## 2016-02-18 LAB — CBG MONITORING, ED: Glucose-Capillary: 132 mg/dL — ABNORMAL HIGH (ref 65–99)

## 2016-02-18 NOTE — ED Notes (Signed)
Per ems, patient came from home with blood glucose reading of 41, ems gave glucagon tablet, last cbg check was 62. bp 140/78, p 100.  Patient arrives alert and answers questions appropriately. Minimal english speaking.

## 2016-02-18 NOTE — ED Notes (Signed)
Daughter leaves callback number if needed: Brian Owen [phone].

## 2016-02-18 NOTE — ED Notes (Signed)
Patient given meal on discharge.

## 2016-02-18 NOTE — ED Notes (Signed)
Daughter called for transport home as requested, she acknowledges, coming to get patient.

## 2016-02-18 NOTE — ED Notes (Signed)
cbg check is 132.

## 2016-02-18 NOTE — Discharge Instructions (Signed)
Hypoglycemia Low blood sugar (hypoglycemia) means that the level of sugar in your blood is lower than it should be. Signs of low blood sugar include:  Getting sweaty.  Feeling hungry.  Feeling dizzy or weak.  Feeling sleepier than normal.  Feeling nervous.  Headaches.  Having a fast heartbeat. Low blood sugar can happen fast and can be an emergency. Your doctor can do tests to check your blood sugar level. You can have low blood sugar and not have diabetes. HOME CARE  Check your blood sugar as told by your doctor. If it is less than 70 mg/dl or as told by your doctor, take 1 of the following:  3 to 4 glucose tablets.   cup clear juice.   cup soda pop, not diet.  1 cup milk.  5 to 6 hard candies.  Recheck blood sugar after 15 minutes. Repeat until it is at the right level.  Eat a snack if it is more than 1 hour until the next meal.  Only take medicine as told by your doctor.  Do not skip meals. Eat on time.  Do not drink alcohol except with meals.  Check your blood glucose before driving.  Check your blood glucose before and after exercise.  Always carry treatment with you, such as glucose pills.  Always wear a medical alert bracelet if you have diabetes. GET HELP RIGHT AWAY IF:   Your blood glucose goes below 70 mg/dl or as told by your doctor, and you:  Are confused.  Are not able to swallow.  Pass out (faint).  You cannot treat yourself. You may need someone to help you.  You have low blood sugar problems often.  You have problems from your medicines.  You are not feeling better after 3 to 4 days.  You have vision changes. MAKE SURE YOU:   Understand these instructions.  Will watch this condition.  Will get help right away if you are not doing well or get worse.   This information is not intended to replace advice given to you by your health care provider. Make sure you discuss any questions you have with your health care provider.     Document Released: 01/09/2010 Document Revised: 11/05/2014 Document Reviewed: 06/21/2015 Elsevier Interactive Patient Education 2016 Elsevier Inc.  

## 2016-02-18 NOTE — ED Provider Notes (Signed)
CSN: [id number]     Arrival date & time 02/18/16  1951 History   First MD Initiated Contact with Patient 02/18/16 2022     Chief Complaint  Patient presents with  . Hypoglycemia     (Consider location/radiation/quality/duration/timing/severity/associated sxs/prior Treatment) Patient is a 65 y.o. male presenting with hypoglycemia.  Hypoglycemia Initial blood sugar:  "low" Blood sugar after intervention:  130 Severity:  Moderate Onset quality:  Sudden Associated symptoms: dizziness   Associated symptoms: no shortness of breath    Just ate dinner and went back to sleep. Takes insulin but eats a lot of rice and has very poor diet control. Often has labile blood sugar. Was feeing dizzy after dinner tonight. Daughter checked CBG and it was 200. Took insulin then his blood gluose went down to 42. He had glucagon then cbg improved to 130.  Now he feels fine.  Denies any other complaints.   Past Medical History  Diagnosis Date  . Heavy cigarette smoker   . Bullae 11/11/2015    legs/notes 11/11/2015  . Uncontrolled type II diabetes mellitus ([place]) dx'd 2013   Past Surgical History  Procedure Laterality Date  . No past surgeries     Family History  Problem Relation Age of Onset  . Diabetes     Social History  Substance Use Topics  . Smoking status: Former Smoker -- 0.25 packs/day for 48 years    Types: Cigarettes    Quit date: 04/28/2014  . Smokeless tobacco: Never Used  . Alcohol Use: No    Review of Systems  Respiratory: Negative for shortness of breath.   Endocrine: Negative for polydipsia.  Genitourinary: Negative for dysuria, flank pain and enuresis.  Neurological: Positive for dizziness.  All other systems reviewed and are negative.     Allergies  Review of patient's allergies indicates no known allergies.  Home Medications   Prior to Admission medications   Medication Sig Start Date End Date Taking? Authorizing Provider  ACCU-CHEK SOFTCLIX LANCETS lancets 1  each by Other route 3 (three) times daily. E11.65 12/05/15   Boykin Nearing, MD  acetaminophen (TYLENOL) 500 MG tablet Take 500 mg by mouth every 6 (six) hours as needed for moderate pain or headache.    Historical Provider, MD  Amino Acids-Protein Hydrolys (FEEDING SUPPLEMENT, PRO-STAT SUGAR FREE 64,) LIQD Take 30 mLs by mouth 3 (three) times daily with meals. 11/13/15   Thurnell Lose, MD  aspirin EC 81 MG EC tablet Take 1 tablet (81 mg total) by mouth daily. 11/29/15   Janece Canterbury, MD  Blood Glucose Monitoring Suppl (ACCU-CHEK AVIVA PLUS) w/Device KIT 1 Device by Does not apply route 3 (three) times daily after meals. E11.65 12/05/15   Josalyn [name], MD  glucose blood (ACCU-CHEK AVIVA PLUS) test strip 1 each by Other route 3 (three) times daily. E11.65 12/05/15   Boykin Nearing, MD  hydrOXYzine (ATARAX/VISTARIL) 25 MG tablet Take 1 tablet (25 mg total) by mouth every 6 (six) hours as needed for itching. 12/20/15   Boykin Nearing, MD  insulin NPH-regular Human (NOVOLIN 70/30) (70-30) 100 UNIT/ML injection Inject 14 Units into the skin 2 (two) times daily with a meal. 02/08/16   Josalyn [name], MD  loperamide (IMODIUM) 1 MG/5ML solution Take 5 mLs (1 mg total) by mouth as needed for diarrhea or loose stools. 02/08/16   Josalyn [name], MD  triamcinolone cream (KENALOG) 0.1 % Apply 1 application topically 2 (two) times daily. 12/20/15   Boykin Nearing, MD   BP 147/79  mmHg  Pulse 64  Temp(Src) 97.9 F (36.6 C) (Oral)  Resp 14  Ht _0  (1.702 m)  Wt 49.896 kg  BMI 17.22 kg/m2  SpO2 100% Physical Exam  Constitutional: He is oriented to person, place, and time. He appears well-developed and well-nourished. No distress.  HENT:  Head: Normocephalic and atraumatic.  Eyes: Conjunctivae are normal. Pupils are equal, round, and reactive to light.  Neck: Normal range of motion. No thyromegaly present.  Cardiovascular: Normal rate and regular rhythm.   Pulmonary/Chest: Effort normal and breath  sounds normal. No respiratory distress.  Abdominal: Soft. Bowel sounds are normal. He exhibits no distension.  Musculoskeletal: Normal range of motion.  Neurological: He is alert and oriented to person, place, and time.  Skin: Skin is warm and dry.  Psychiatric: He has a normal mood and affect.  Nursing note and vitals reviewed.   ED Course  Procedures (including critical care time) Labs Review Labs Reviewed  CBG MONITORING, ED - Abnormal; Notable for the following:    Glucose-Capillary 132 (*)    All other components within normal limits  I-STAT CHEM 8, ED - Abnormal; Notable for the following:    Potassium 3.3 (*)    BUN 30 (*)    Creatinine, Ser 1.40 (*)    Glucose, Bld 137 (*)    All other components within normal limits    Imaging Review No results found. I have personally reviewed and evaluated these images and lab results as part of my medical decision-making.   EKG Interpretation None      MDM   Final diagnoses:  Hypoglycemia   Patient presented with hyperglycemia. This is a recurrent issue. He has normal blood sugar on his arrival here. He was given food, observed in the emergency department for several hours, and had no recurrent hypoglycemia. He was discharged in the care of his daughter, no emergent medical conditions identified.  Leata Mouse, MD 02/19/16 2224  Harvel Quale, MD 02/22/16 [phone]

## 2016-02-18 NOTE — ED Notes (Signed)
Patient speaks minimal english, primary language is cambodian.

## 2016-02-20 ENCOUNTER — Encounter (HOSPITAL_COMMUNITY): Payer: Self-pay | Admitting: Emergency Medicine

## 2016-02-20 ENCOUNTER — Emergency Department (HOSPITAL_COMMUNITY)
Admission: EM | Admit: 2016-02-20 | Discharge: 2016-02-21 | Disposition: A | Discharge status: Home / Self Care | Payer: Medicare Other | Attending: Emergency Medicine | Admitting: Emergency Medicine

## 2016-02-20 DIAGNOSIS — E11649 Type 2 diabetes mellitus with hypoglycemia without coma: Secondary | ICD-10-CM | POA: Insufficient documentation

## 2016-02-20 DIAGNOSIS — Z79899 Other long term (current) drug therapy: Secondary | ICD-10-CM | POA: Diagnosis not present

## 2016-02-20 DIAGNOSIS — B952 Enterococcus as the cause of diseases classified elsewhere: Secondary | ICD-10-CM | POA: Diagnosis not present

## 2016-02-20 DIAGNOSIS — Z87891 Personal history of nicotine dependence: Secondary | ICD-10-CM | POA: Insufficient documentation

## 2016-02-20 DIAGNOSIS — E162 Hypoglycemia, unspecified: Secondary | ICD-10-CM

## 2016-02-20 DIAGNOSIS — E1100 Type 2 diabetes mellitus with hyperosmolarity without nonketotic hyperglycemic-hyperosmolar coma (NKHHC): Secondary | ICD-10-CM

## 2016-02-20 DIAGNOSIS — Z872 Personal history of diseases of the skin and subcutaneous tissue: Secondary | ICD-10-CM | POA: Insufficient documentation

## 2016-02-20 DIAGNOSIS — N390 Urinary tract infection, site not specified: Secondary | ICD-10-CM | POA: Insufficient documentation

## 2016-02-20 DIAGNOSIS — Z7982 Long term (current) use of aspirin: Secondary | ICD-10-CM | POA: Diagnosis not present

## 2016-02-20 DIAGNOSIS — E161 Other hypoglycemia: Secondary | ICD-10-CM | POA: Diagnosis not present

## 2016-02-20 DIAGNOSIS — Z794 Long term (current) use of insulin: Secondary | ICD-10-CM | POA: Diagnosis not present

## 2016-02-20 LAB — CBG MONITORING, ED: GLUCOSE-CAPILLARY: 89 mg/dL (ref 65–99)

## 2016-02-20 LAB — CBC WITH DIFFERENTIAL/PLATELET
BASOS ABS: 0 10*3/uL (ref 0.0–0.1)
BASOS PCT: 0 %
EOS PCT: 3 %
Eosinophils Absolute: 0.3 10*3/uL (ref 0.0–0.7)
HCT: 31.8 % — ABNORMAL LOW (ref 39.0–52.0)
HEMOGLOBIN: 10.6 g/dL — AB (ref 13.0–17.0)
LYMPHS ABS: 2.3 10*3/uL (ref 0.7–4.0)
LYMPHS PCT: 23 %
MCH: 30.1 pg (ref 26.0–34.0)
MCHC: 33.3 g/dL (ref 30.0–36.0)
MCV: 90.3 fL (ref 78.0–100.0)
MONO ABS: 0.5 10*3/uL (ref 0.1–1.0)
MONOS PCT: 5 %
NEUTROS ABS: 6.6 10*3/uL (ref 1.7–7.7)
Neutrophils Relative %: 69 %
Platelets: 212 10*3/uL (ref 150–400)
RBC: 3.52 MIL/uL — AB (ref 4.22–5.81)
RDW: 14.9 % (ref 11.5–15.5)
WBC: 9.7 10*3/uL (ref 4.0–10.5)

## 2016-02-20 NOTE — ED Notes (Signed)
Patient presents from home via EMS for hypoglycemia, cbg on arrival was 41. Patient ate at home, cbg up to 97. C/o dizziness at home, has resolved upon arrival to ED. Patient A&O, ambulatory to stretcher.   Last VS: 112/70, 80hr.

## 2016-02-20 NOTE — ED Provider Notes (Signed)
CSN: [id number]     Arrival date & time 02/20/16  2243 History  By signing my name below, I, Helane Gunther, attest that this documentation has been prepared under the direction and in the presence of Sharlett Iles, MD. Electronically Signed: Helane Gunther, ED Scribe. 02/21/2016. 12:01 AM.    Chief Complaint  Patient presents with  . Hypoglycemia   The history is provided by the patient. The history is limited by a language barrier. No language interpreter was used.   HPI Comments: Brian Owen is a 65 y.o. male former smoker with a PMHx of uncontrolled type II DM brought in by ambulance, who presents to the Emergency Department complaining of hypoglycemia onset this evening. Pt states he has been taking his insulin daily, 12u BID. Pt states his blood sugar was measured at 41 when EMS arrived. He notes it then increased to 89, and was last measured at 97 here. He complained of dizziness at home which is resolved here. Pt was here 1 day ago with the same complaint. Pt denies cough, n/v/d, fever, and any pains.   Past Medical History  Diagnosis Date  . Heavy cigarette smoker   . Bullae 11/11/2015    legs/notes 11/11/2015  . Uncontrolled type II diabetes mellitus ([place]) dx'd 2013   Past Surgical History  Procedure Laterality Date  . No past surgeries     Family History  Problem Relation Age of Onset  . Diabetes     Social History  Substance Use Topics  . Smoking status: Former Smoker -- 0.25 packs/day for 48 years    Types: Cigarettes    Quit date: 04/28/2014  . Smokeless tobacco: Never Used  . Alcohol Use: No    Review of Systems 10 Systems reviewed and all are negative for acute change except as noted in the HPI.   Allergies  Review of patient's allergies indicates no known allergies.  Home Medications   Prior to Admission medications   Medication Sig Start Date End Date Taking? Authorizing Provider  acetaminophen (TYLENOL) 500 MG tablet Take 500 mg by mouth every 6  (six) hours as needed for moderate pain or headache.   Yes Historical Provider, MD  ACCU-CHEK SOFTCLIX LANCETS lancets 1 each by Other route 3 (three) times daily. E11.65 12/05/15   Boykin Nearing, MD  Amino Acids-Protein Hydrolys (FEEDING SUPPLEMENT, PRO-STAT SUGAR FREE 64,) LIQD Take 30 mLs by mouth 3 (three) times daily with meals. Patient not taking: Reported on 02/20/2016 11/13/15   Thurnell Lose, MD  aspirin EC 81 MG EC tablet Take 1 tablet (81 mg total) by mouth daily. Patient not taking: Reported on 02/20/2016 11/29/15   Janece Canterbury, MD  Blood Glucose Monitoring Suppl (ACCU-CHEK AVIVA PLUS) w/Device KIT 1 Device by Does not apply route 3 (three) times daily after meals. E11.65 12/05/15   Josalyn [name], MD  cephALEXin (KEFLEX) 500 MG capsule Take 1 capsule (500 mg total) by mouth 2 (two) times daily. 02/21/16   Wenda Overland Tradarius Reinwald, MD  glucose blood (ACCU-CHEK AVIVA PLUS) test strip 1 each by Other route 3 (three) times daily. E11.65 12/05/15   Boykin Nearing, MD  hydrOXYzine (ATARAX/VISTARIL) 25 MG tablet Take 1 tablet (25 mg total) by mouth every 6 (six) hours as needed for itching. Patient not taking: Reported on 02/20/2016 12/20/15   Boykin Nearing, MD  insulin NPH-regular Human (NOVOLIN 70/30) (70-30) 100 UNIT/ML injection Inject 7 Units into the skin 2 (two) times daily with a meal. 02/21/16   Apolonio Schneiders  Enis Gash, MD   BP 149/91 mmHg  Pulse 94  Temp(Src) 97.5 F (36.4 C) (Oral)  Resp 18  SpO2 100% Physical Exam  Constitutional: He is oriented to person, place, and time. He appears well-developed and well-nourished. No distress.  HENT:  Head: Normocephalic and atraumatic.  Moist mucous membranes  Eyes: Conjunctivae are normal. Pupils are equal, round, and reactive to light.  Neck: Neck supple.  Cardiovascular: Normal rate, regular rhythm and normal heart sounds.   No murmur heard. Pulmonary/Chest: Effort normal and breath sounds normal.  Abdominal: Soft. Bowel sounds are  normal. He exhibits no distension. There is no tenderness.  Musculoskeletal: He exhibits no edema.  Neurological: He is alert and oriented to person, place, and time.  Fluent speech  Skin: Skin is warm and dry. No rash noted.  Psychiatric: He has a normal mood and affect. Judgment normal.  Nursing note and vitals reviewed.   ED Course  Procedures  DIAGNOSTIC STUDIES: Oxygen Saturation is 100% on RA, normal by my interpretation.    COORDINATION OF CARE: 11:31 PM - Discussed plans to order diagnostic studies and observe pt's cbg. Pt advised of plan for treatment and pt agrees.  Labs Review Labs Reviewed  BASIC METABOLIC PANEL - Abnormal; Notable for the following:    Potassium 3.3 (*)    BUN 21 (*)    Creatinine, Ser 1.47 (*)    GFR calc non Af Amer 48 (*)    GFR calc Af Amer 56 (*)    All other components within normal limits  CBC WITH DIFFERENTIAL/PLATELET - Abnormal; Notable for the following:    RBC 3.52 (*)    Hemoglobin 10.6 (*)    HCT 31.8 (*)    All other components within normal limits  URINALYSIS, ROUTINE W REFLEX MICROSCOPIC (NOT AT [facility]) - Abnormal; Notable for the following:    APPearance CLOUDY (*)    Glucose, UA 500 (*)    Hgb urine dipstick TRACE (*)    Protein, ur 30 (*)    Leukocytes, UA LARGE (*)    All other components within normal limits  URINE MICROSCOPIC-ADD ON - Abnormal; Notable for the following:    Squamous Epithelial / LPF 0-5 (*)    Bacteria, UA MANY (*)    All other components within normal limits  CBG MONITORING, ED - Abnormal; Notable for the following:    Glucose-Capillary 101 (*)    All other components within normal limits  CBG MONITORING, ED - Abnormal; Notable for the following:    Glucose-Capillary 100 (*)    All other components within normal limits  URINE CULTURE  CBG MONITORING, ED  CBG MONITORING, ED    Imaging Review No results found. I have personally reviewed and evaluated these lab results as part of my medical  decision-making.   EKG Interpretation None      Medications  potassium chloride SA (K-DUR,KLOR-CON) CR tablet 40 mEq (40 mEq Oral Given 02/21/16 0129)  cefTRIAXone (ROCEPHIN) injection 1 g (1 g Intramuscular Given 02/21/16 0405)  lidocaine (PF) (XYLOCAINE) 1 % injection (5 mLs  Given 02/21/16 0405)    MDM   Final diagnoses:  UTI (lower urinary tract infection)  Hypoglycemia   He should presents with hypoglycemia episode that occurred at home, had same episode one day ago for which he was evaluated in the ED. On exam, he was awake and alert, comfortable with no complaints. He ate at home after CBG was 41 by EMS. Obtained above lab work  here and had patient eat and drink. Labs show mildly elevated Cr 1.47 which is similar to recent labs, Hgb 10.6, normal WBC count, glucose 72. Repeat glucose a few hours later 101. UA does show infection with large leukocytes, too numerous to count WBCs and many bacteria. Urine culture sent. Gave patient ceftriaxone as well as potassium for his mild hypokalemia at 3.3. The patient has been euglycemic for several hours here but I am suspicious that his home dose of insulin may be too high given his small size and likely poor diet. I discussed with the patient and his daughter the need to decrease his insulin by half to 7 units twice a day. I also discussed treatment of UTI with Keflex. The patient has been afebrile, comfortable, and without confusion here therefore I feel he is safe for discharge home. I instructed daughter to schedule appointment as soon as possible with PCP for further discussion of his insulin regimen and follow-up of his UTI. Reviewed return precautions and they voiced understanding. Patient discharged in satisfactory condition.  I personally performed the services described in this documentation, which was scribed in my presence. The recorded information has been reviewed and is accurate.   Sharlett Iles, MD 02/21/16 0800

## 2016-02-20 NOTE — ED Notes (Signed)
Bed: RESB Expected date:  Expected time:  Means of arrival:  Comments: EMS 65yo M hypoglycemia

## 2016-02-21 ENCOUNTER — Telehealth: Payer: Self-pay | Admitting: *Deleted

## 2016-02-21 DIAGNOSIS — D649 Anemia, unspecified: Secondary | ICD-10-CM | POA: Diagnosis not present

## 2016-02-21 DIAGNOSIS — S51802D Unspecified open wound of left forearm, subsequent encounter: Secondary | ICD-10-CM | POA: Diagnosis not present

## 2016-02-21 DIAGNOSIS — S71102D Unspecified open wound, left thigh, subsequent encounter: Secondary | ICD-10-CM | POA: Diagnosis not present

## 2016-02-21 DIAGNOSIS — E43 Unspecified severe protein-calorie malnutrition: Secondary | ICD-10-CM | POA: Diagnosis not present

## 2016-02-21 DIAGNOSIS — E1165 Type 2 diabetes mellitus with hyperglycemia: Secondary | ICD-10-CM | POA: Diagnosis not present

## 2016-02-21 DIAGNOSIS — E1143 Type 2 diabetes mellitus with diabetic autonomic (poly)neuropathy: Secondary | ICD-10-CM | POA: Diagnosis not present

## 2016-02-21 DIAGNOSIS — L282 Other prurigo: Secondary | ICD-10-CM

## 2016-02-21 LAB — BASIC METABOLIC PANEL
Anion gap: 10 (ref 5–15)
BUN: 21 mg/dL — AB (ref 6–20)
CHLORIDE: 107 mmol/L (ref 101–111)
CO2: 24 mmol/L (ref 22–32)
Calcium: 9.3 mg/dL (ref 8.9–10.3)
Creatinine, Ser: 1.47 mg/dL — ABNORMAL HIGH (ref 0.61–1.24)
GFR calc Af Amer: 56 mL/min — ABNORMAL LOW (ref >60)
GFR calc non Af Amer: 48 mL/min — ABNORMAL LOW (ref >60)
GLUCOSE: 72 mg/dL (ref 65–99)
POTASSIUM: 3.3 mmol/L — AB (ref 3.5–5.1)
Sodium: 141 mmol/L (ref 135–145)

## 2016-02-21 LAB — CBG MONITORING, ED
Glucose-Capillary: 101 mg/dL — ABNORMAL HIGH (ref 65–99)
Glucose-Capillary: 100 mg/dL — ABNORMAL HIGH (ref 65–99)

## 2016-02-21 LAB — URINALYSIS, ROUTINE W REFLEX MICROSCOPIC
Bilirubin Urine: NEGATIVE
GLUCOSE, UA: 500 mg/dL — AB
Ketones, ur: NEGATIVE mg/dL
Nitrite: NEGATIVE
Protein, ur: 30 mg/dL — AB
SPECIFIC GRAVITY, URINE: 1.013 (ref 1.005–1.030)
pH: 6 (ref 5.0–8.0)

## 2016-02-21 LAB — URINE MICROSCOPIC-ADD ON

## 2016-02-21 MED ORDER — DEXTROSE 5 % IV SOLN: 1.0000 g | Freq: Once | INTRAVENOUS | Status: DC

## 2016-02-21 MED ORDER — LIDOCAINE HCL (PF) 1 % IJ SOLN
INTRAMUSCULAR | Status: AC
  Administered 2016-02-21: 5 mL
  Filled 2016-02-21: qty 5

## 2016-02-21 MED ORDER — INSULIN NPH ISOPHANE & REGULAR (70-30) 100 UNIT/ML ~~LOC~~ SUSP: 7.0000 [IU] | Freq: Two times a day (BID) | SUBCUTANEOUS | Status: DC

## 2016-02-21 MED ORDER — CEPHALEXIN 500 MG PO CAPS: 500.0000 mg | ORAL_CAPSULE | Freq: Two times a day (BID) | ORAL | Status: DC

## 2016-02-21 MED ORDER — CAMPHOR-MENTHOL 0.5-0.5 % EX LOTN
1.0000 | TOPICAL_LOTION | CUTANEOUS | Status: DC | PRN
Start: 2016-02-21 — End: 2017-03-07

## 2016-02-21 MED ORDER — CEFTRIAXONE SODIUM 1 G IJ SOLR
1.0000 g | Freq: Once | INTRAMUSCULAR | Status: AC
  Administered 2016-02-21: 1 g via INTRAMUSCULAR
  Filled 2016-02-21: qty 10

## 2016-02-21 MED ORDER — POTASSIUM CHLORIDE CRYS ER 20 MEQ PO TBCR
40.0000 meq | EXTENDED_RELEASE_TABLET | Freq: Once | ORAL | Status: AC
  Administered 2016-02-21: 40 meq via ORAL
  Filled 2016-02-21: qty 2

## 2016-02-21 NOTE — Discharge Instructions (Signed)
PLEASE REDUCE YOUR INSULIN DOSE TO 7 UNITS OF NOVOLIN TWICE DAILY WITH MEALS.

## 2016-02-21 NOTE — Telephone Encounter (Signed)
Nurse stated itching cream  not working  Requesting Rx for itching be change ?

## 2016-02-21 NOTE — ED Notes (Signed)
Patient made aware of needed urine sample. 

## 2016-02-21 NOTE — ED Notes (Signed)
Gave pt a cup of water and the urinal to use

## 2016-02-21 NOTE — Telephone Encounter (Signed)
Home Care Nurse call  Medication list given to nurse  Above information given  Increased Novolin 70/30 increased to 14 unit Nurse will go and check on him today  Will check his medication and updated

## 2016-02-21 NOTE — Telephone Encounter (Signed)
Sent in [place]

## 2016-02-22 DIAGNOSIS — E109 Type 1 diabetes mellitus without complications: Secondary | ICD-10-CM | POA: Diagnosis not present

## 2016-02-22 DIAGNOSIS — R194 Change in bowel habit: Secondary | ICD-10-CM | POA: Diagnosis not present

## 2016-02-22 DIAGNOSIS — Z1211 Encounter for screening for malignant neoplasm of colon: Secondary | ICD-10-CM | POA: Diagnosis not present

## 2016-02-22 NOTE — Telephone Encounter (Signed)
Megan form [facility] requesting HFU appointment  Please schedule morning appointment with PCP Give Information to [facility] at [phone]  Thanks

## 2016-02-23 ENCOUNTER — Telehealth: Payer: Self-pay | Admitting: Family Medicine

## 2016-02-23 DIAGNOSIS — S71102D Unspecified open wound, left thigh, subsequent encounter: Secondary | ICD-10-CM | POA: Diagnosis not present

## 2016-02-23 DIAGNOSIS — E43 Unspecified severe protein-calorie malnutrition: Secondary | ICD-10-CM | POA: Diagnosis not present

## 2016-02-23 DIAGNOSIS — E1165 Type 2 diabetes mellitus with hyperglycemia: Secondary | ICD-10-CM | POA: Diagnosis not present

## 2016-02-23 DIAGNOSIS — E1143 Type 2 diabetes mellitus with diabetic autonomic (poly)neuropathy: Secondary | ICD-10-CM | POA: Diagnosis not present

## 2016-02-23 DIAGNOSIS — S51802D Unspecified open wound of left forearm, subsequent encounter: Secondary | ICD-10-CM | POA: Diagnosis not present

## 2016-02-23 DIAGNOSIS — D649 Anemia, unspecified: Secondary | ICD-10-CM | POA: Diagnosis not present

## 2016-02-23 LAB — URINE CULTURE: Special Requests: NORMAL

## 2016-02-23 NOTE — Telephone Encounter (Signed)
Called Megan to let her know of pt. Appointment on 03/01/16. She would also like for the pt. PCP to call her or pt. Daughter to talk about pt. Insulin because the his PCP increased his insulin to 14 units and the hospital decreased it to 7 units. Please f/u.   Pt. Daughter phone- [phone]

## 2016-02-24 ENCOUNTER — Telehealth: Payer: Self-pay | Admitting: *Deleted

## 2016-02-24 NOTE — Progress Notes (Signed)
ED Antimicrobial Stewardship Positive Culture Follow Up   Hassani Swee is an 65 y.o. male who presented to [facility] on 02/20/2016 with a chief complaint of  Chief Complaint  Patient presents with  . Hypoglycemia    Recent Results (from the past 720 hour(s))  Urine culture     Status: Abnormal   Collection Time: 02/21/16  2:25 AM  Result Value Ref Range Status   Specimen Description URINE, CLEAN CATCH  Final   Special Requests Normal  Final   Culture 80,000 COLONIES/mL ENTEROCOCCUS SPECIES (A)  Final   Report Status 02/23/2016 FINAL  Final   Organism ID, Bacteria ENTEROCOCCUS SPECIES (A)  Final      Susceptibility   Enterococcus species - MIC*    AMPICILLIN <=2 SENSITIVE Sensitive     LEVOFLOXACIN 1 SENSITIVE Sensitive     NITROFURANTOIN <=16 SENSITIVE Sensitive     VANCOMYCIN 1 SENSITIVE Sensitive     * 80,000 COLONIES/mL ENTEROCOCCUS SPECIES   D/C keflex since enterococcus is resistant add amox 500 mg bid x 1 week  ED Provider: Margreta Journey, PA-C  Wynell Balloon 02/24/2016, 8:09 AM Infectious Diseases Pharmacist Phone# [phone]

## 2016-02-24 NOTE — ED Notes (Signed)
Post ED Visit - Positive Culture Follow-up: Successful Patient Follow-Up  Culture assessed and recommendations reviewed by: []  Elenor Quinones, Pharm.D. []  Heide Guile, Pharm.D., BCPS []  Parks Neptune, Pharm.D. []  Alycia Rossetti, Pharm.D., BCPS []  [place], [place].D., BCPS, AAHIVP []  Legrand Como, Pharm.D., BCPS, AAHIVP []  Milus Glazier, Pharm.D. []  Rob Jacumba, Pharm.D.  Positive urine culture  []  Patient discharged without antimicrobial prescription and treatment is now indicated [x]  Organism is resistant to prescribed ED discharge antimicrobial []  Patient with positive blood cultures  Changes discussed with ED provider Monico Blitz, PA-C New antibiotic prescription Amoxicillian 500mg  PO BID X 7 days Called to [facility] [employer], [phone]  Contacted patient and advised to stop Keflex and begin new antibiotic, date 02/24/2016  Time Cartago 02/24/2016, 11:35 AM

## 2016-02-28 DIAGNOSIS — S71102D Unspecified open wound, left thigh, subsequent encounter: Secondary | ICD-10-CM | POA: Diagnosis not present

## 2016-02-28 DIAGNOSIS — E1143 Type 2 diabetes mellitus with diabetic autonomic (poly)neuropathy: Secondary | ICD-10-CM | POA: Diagnosis not present

## 2016-02-28 DIAGNOSIS — S51802D Unspecified open wound of left forearm, subsequent encounter: Secondary | ICD-10-CM | POA: Diagnosis not present

## 2016-02-28 DIAGNOSIS — D649 Anemia, unspecified: Secondary | ICD-10-CM | POA: Diagnosis not present

## 2016-02-28 DIAGNOSIS — E1165 Type 2 diabetes mellitus with hyperglycemia: Secondary | ICD-10-CM | POA: Diagnosis not present

## 2016-02-28 DIAGNOSIS — E43 Unspecified severe protein-calorie malnutrition: Secondary | ICD-10-CM | POA: Diagnosis not present

## 2016-03-01 ENCOUNTER — Encounter (HOSPITAL_BASED_OUTPATIENT_CLINIC_OR_DEPARTMENT_OTHER): Payer: Medicare Other | Attending: Internal Medicine

## 2016-03-01 ENCOUNTER — Inpatient Hospital Stay: Payer: Medicare Other | Admitting: Family Medicine

## 2016-03-01 DIAGNOSIS — E11622 Type 2 diabetes mellitus with other skin ulcer: Secondary | ICD-10-CM | POA: Insufficient documentation

## 2016-03-01 DIAGNOSIS — E43 Unspecified severe protein-calorie malnutrition: Secondary | ICD-10-CM | POA: Diagnosis not present

## 2016-03-01 DIAGNOSIS — E1165 Type 2 diabetes mellitus with hyperglycemia: Secondary | ICD-10-CM | POA: Diagnosis not present

## 2016-03-01 DIAGNOSIS — I10 Essential (primary) hypertension: Secondary | ICD-10-CM | POA: Insufficient documentation

## 2016-03-01 DIAGNOSIS — Z794 Long term (current) use of insulin: Secondary | ICD-10-CM | POA: Insufficient documentation

## 2016-03-01 DIAGNOSIS — F0390 Unspecified dementia without behavioral disturbance: Secondary | ICD-10-CM | POA: Insufficient documentation

## 2016-03-01 DIAGNOSIS — S51802D Unspecified open wound of left forearm, subsequent encounter: Secondary | ICD-10-CM | POA: Diagnosis not present

## 2016-03-01 DIAGNOSIS — X58XXXA Exposure to other specified factors, initial encounter: Secondary | ICD-10-CM | POA: Insufficient documentation

## 2016-03-01 DIAGNOSIS — E1143 Type 2 diabetes mellitus with diabetic autonomic (poly)neuropathy: Secondary | ICD-10-CM | POA: Insufficient documentation

## 2016-03-01 DIAGNOSIS — D649 Anemia, unspecified: Secondary | ICD-10-CM | POA: Diagnosis not present

## 2016-03-01 DIAGNOSIS — L97123 Non-pressure chronic ulcer of left thigh with necrosis of muscle: Secondary | ICD-10-CM | POA: Insufficient documentation

## 2016-03-01 DIAGNOSIS — S71102D Unspecified open wound, left thigh, subsequent encounter: Secondary | ICD-10-CM | POA: Diagnosis not present

## 2016-03-01 DIAGNOSIS — Z87891 Personal history of nicotine dependence: Secondary | ICD-10-CM | POA: Insufficient documentation

## 2016-03-01 DIAGNOSIS — S41102A Unspecified open wound of left upper arm, initial encounter: Secondary | ICD-10-CM | POA: Insufficient documentation

## 2016-03-06 DIAGNOSIS — E43 Unspecified severe protein-calorie malnutrition: Secondary | ICD-10-CM | POA: Diagnosis not present

## 2016-03-06 DIAGNOSIS — S71102D Unspecified open wound, left thigh, subsequent encounter: Secondary | ICD-10-CM | POA: Diagnosis not present

## 2016-03-06 DIAGNOSIS — S51802D Unspecified open wound of left forearm, subsequent encounter: Secondary | ICD-10-CM | POA: Diagnosis not present

## 2016-03-06 DIAGNOSIS — E1165 Type 2 diabetes mellitus with hyperglycemia: Secondary | ICD-10-CM | POA: Diagnosis not present

## 2016-03-06 DIAGNOSIS — D649 Anemia, unspecified: Secondary | ICD-10-CM | POA: Diagnosis not present

## 2016-03-06 DIAGNOSIS — E1143 Type 2 diabetes mellitus with diabetic autonomic (poly)neuropathy: Secondary | ICD-10-CM | POA: Diagnosis not present

## 2016-03-08 DIAGNOSIS — F0390 Unspecified dementia without behavioral disturbance: Secondary | ICD-10-CM | POA: Diagnosis not present

## 2016-03-08 DIAGNOSIS — Z794 Long term (current) use of insulin: Secondary | ICD-10-CM | POA: Diagnosis not present

## 2016-03-08 DIAGNOSIS — Z872 Personal history of diseases of the skin and subcutaneous tissue: Secondary | ICD-10-CM | POA: Diagnosis not present

## 2016-03-08 DIAGNOSIS — Z87891 Personal history of nicotine dependence: Secondary | ICD-10-CM | POA: Diagnosis not present

## 2016-03-08 DIAGNOSIS — E1143 Type 2 diabetes mellitus with diabetic autonomic (poly)neuropathy: Secondary | ICD-10-CM | POA: Diagnosis not present

## 2016-03-08 DIAGNOSIS — X58XXXA Exposure to other specified factors, initial encounter: Secondary | ICD-10-CM | POA: Diagnosis not present

## 2016-03-08 DIAGNOSIS — L97123 Non-pressure chronic ulcer of left thigh with necrosis of muscle: Secondary | ICD-10-CM | POA: Diagnosis not present

## 2016-03-08 DIAGNOSIS — I10 Essential (primary) hypertension: Secondary | ICD-10-CM | POA: Diagnosis not present

## 2016-03-08 DIAGNOSIS — E11622 Type 2 diabetes mellitus with other skin ulcer: Secondary | ICD-10-CM | POA: Diagnosis not present

## 2016-03-08 DIAGNOSIS — Z09 Encounter for follow-up examination after completed treatment for conditions other than malignant neoplasm: Secondary | ICD-10-CM | POA: Diagnosis not present

## 2016-03-08 DIAGNOSIS — S41102A Unspecified open wound of left upper arm, initial encounter: Secondary | ICD-10-CM | POA: Diagnosis not present

## 2016-03-09 DIAGNOSIS — E1165 Type 2 diabetes mellitus with hyperglycemia: Secondary | ICD-10-CM | POA: Diagnosis not present

## 2016-03-09 DIAGNOSIS — E1143 Type 2 diabetes mellitus with diabetic autonomic (poly)neuropathy: Secondary | ICD-10-CM | POA: Diagnosis not present

## 2016-03-09 DIAGNOSIS — S51802D Unspecified open wound of left forearm, subsequent encounter: Secondary | ICD-10-CM | POA: Diagnosis not present

## 2016-03-09 DIAGNOSIS — D649 Anemia, unspecified: Secondary | ICD-10-CM | POA: Diagnosis not present

## 2016-03-09 DIAGNOSIS — S71102D Unspecified open wound, left thigh, subsequent encounter: Secondary | ICD-10-CM | POA: Diagnosis not present

## 2016-03-09 DIAGNOSIS — E43 Unspecified severe protein-calorie malnutrition: Secondary | ICD-10-CM | POA: Diagnosis not present

## 2016-03-20 ENCOUNTER — Inpatient Hospital Stay: Payer: Medicare Other | Admitting: Family Medicine

## 2016-06-15 ENCOUNTER — Inpatient Hospital Stay (HOSPITAL_COMMUNITY)
Admission: EM | Admit: 2016-06-15 | Discharge: 2016-06-20 | DRG: 638 | Disposition: A | Discharge status: Home / Self Care | Payer: Medicare Other | Attending: Internal Medicine | Admitting: Internal Medicine

## 2016-06-15 ENCOUNTER — Emergency Department (HOSPITAL_COMMUNITY): Payer: Medicare Other

## 2016-06-15 ENCOUNTER — Encounter (HOSPITAL_COMMUNITY): Payer: Self-pay | Admitting: Emergency Medicine

## 2016-06-15 DIAGNOSIS — E1122 Type 2 diabetes mellitus with diabetic chronic kidney disease: Secondary | ICD-10-CM | POA: Diagnosis present

## 2016-06-15 DIAGNOSIS — I951 Orthostatic hypotension: Secondary | ICD-10-CM | POA: Diagnosis present

## 2016-06-15 DIAGNOSIS — N390 Urinary tract infection, site not specified: Secondary | ICD-10-CM | POA: Diagnosis present

## 2016-06-15 DIAGNOSIS — E162 Hypoglycemia, unspecified: Secondary | ICD-10-CM | POA: Diagnosis not present

## 2016-06-15 DIAGNOSIS — E119 Type 2 diabetes mellitus without complications: Secondary | ICD-10-CM

## 2016-06-15 DIAGNOSIS — N189 Chronic kidney disease, unspecified: Secondary | ICD-10-CM | POA: Diagnosis not present

## 2016-06-15 DIAGNOSIS — B952 Enterococcus as the cause of diseases classified elsewhere: Secondary | ICD-10-CM | POA: Diagnosis not present

## 2016-06-15 DIAGNOSIS — Z87891 Personal history of nicotine dependence: Secondary | ICD-10-CM

## 2016-06-15 DIAGNOSIS — Z794 Long term (current) use of insulin: Secondary | ICD-10-CM

## 2016-06-15 DIAGNOSIS — N183 Chronic kidney disease, stage 3 unspecified: Secondary | ICD-10-CM | POA: Diagnosis present

## 2016-06-15 DIAGNOSIS — Z833 Family history of diabetes mellitus: Secondary | ICD-10-CM

## 2016-06-15 DIAGNOSIS — R42 Dizziness and giddiness: Secondary | ICD-10-CM | POA: Diagnosis not present

## 2016-06-15 DIAGNOSIS — E11649 Type 2 diabetes mellitus with hypoglycemia without coma: Principal | ICD-10-CM | POA: Diagnosis present

## 2016-06-15 LAB — CBC WITH DIFFERENTIAL/PLATELET
Basophils Absolute: 0 10*3/uL (ref 0.0–0.1)
Basophils Relative: 0 %
Eosinophils Absolute: 0.3 10*3/uL (ref 0.0–0.7)
Eosinophils Relative: 4 %
HEMATOCRIT: 34.9 % — AB (ref 39.0–52.0)
HEMOGLOBIN: 11.5 g/dL — AB (ref 13.0–17.0)
LYMPHS ABS: 2 10*3/uL (ref 0.7–4.0)
LYMPHS PCT: 25 %
MCH: 29.8 pg (ref 26.0–34.0)
MCHC: 33 g/dL (ref 30.0–36.0)
MCV: 90.4 fL (ref 78.0–100.0)
MONOS PCT: 6 %
Monocytes Absolute: 0.5 10*3/uL (ref 0.1–1.0)
NEUTROS ABS: 5 10*3/uL (ref 1.7–7.7)
NEUTROS PCT: 65 %
Platelets: 190 10*3/uL (ref 150–400)
RBC: 3.86 MIL/uL — AB (ref 4.22–5.81)
RDW: 13.6 % (ref 11.5–15.5)
WBC: 7.7 10*3/uL (ref 4.0–10.5)

## 2016-06-15 LAB — I-STAT TROPONIN, ED: Troponin i, poc: 0 ng/mL (ref 0.00–0.08)

## 2016-06-15 LAB — COMPREHENSIVE METABOLIC PANEL
ALK PHOS: 97 U/L (ref 38–126)
ALT: 21 U/L (ref 17–63)
AST: 25 U/L (ref 15–41)
Albumin: 3.9 g/dL (ref 3.5–5.0)
Anion gap: 5 (ref 5–15)
BILIRUBIN TOTAL: 0.4 mg/dL (ref 0.3–1.2)
BUN: 16 mg/dL (ref 6–20)
CALCIUM: 9.2 mg/dL (ref 8.9–10.3)
CO2: 25 mmol/L (ref 22–32)
CREATININE: 1.39 mg/dL — AB (ref 0.61–1.24)
Chloride: 109 mmol/L (ref 101–111)
GFR, EST AFRICAN AMERICAN: 60 mL/min — AB (ref >60)
GFR, EST NON AFRICAN AMERICAN: 52 mL/min — AB (ref >60)
Glucose, Bld: 84 mg/dL (ref 65–99)
Potassium: 3.5 mmol/L (ref 3.5–5.1)
SODIUM: 139 mmol/L (ref 135–145)
TOTAL PROTEIN: 8.1 g/dL (ref 6.5–8.1)

## 2016-06-15 LAB — CBG MONITORING, ED: Glucose-Capillary: 67 mg/dL (ref 65–99)

## 2016-06-15 MED ORDER — DEXTROSE 10 % IV SOLN
INTRAVENOUS | Status: DC
  Administered 2016-06-15: 23:00:00 via INTRAVENOUS
  Filled 2016-06-15: qty 1000

## 2016-06-15 NOTE — ED Provider Notes (Signed)
[facility] DEPT Provider Note   CSN: [id number] Arrival date & time: 06/15/16  2141     History   Chief Complaint Chief Complaint  Patient presents with  . Hypoglycemia    HPI Farrell Ours is a 65 y.o. male.  The history is provided by the patient. The history is limited by a language barrier. A language interpreter was used.  Hypoglycemia  Initial blood sugar:  30 Blood sugar after intervention:  60 Severity:  Severe Onset quality:  Gradual Duration:  1 day Timing:  Constant Progression:  Worsening Chronicity:  New Diabetic status:  Controlled with insulin Current diabetic therapy:  Insulin otherwise unknown Time since last antidiabetic medication:  1 day Context: not decreased oral intake, not diet changes, not new diabetes diagnosis, not recent illness and not treatment noncompliance   Relieved by:  Nothing Ineffective treatments:  Eating and glucagon Associated symptoms: weakness   Associated symptoms: no altered mental status, no dizziness, no speech difficulty, no sweats and no vomiting   Associated symptoms comment:  Fatigue and weakness today.  Felt fine yesterday Risk factors: no drug use, no kidney disease and no recent surgery     Past Medical History:  Diagnosis Date  . Diabetes mellitus without complication ([place])     There are no active problems to display for this patient.   No past surgical history on file.     Home Medications    Prior to Admission medications   Not on File    Family History No family history on file.  Social History Social History  Substance Use Topics  . Smoking status: Former Research scientist (life sciences)  . Smokeless tobacco: Never Used  . Alcohol use No     Allergies   Review of patient's allergies indicates no known allergies.   Review of Systems Review of Systems  Constitutional: Negative for diaphoresis.  Gastrointestinal: Negative for vomiting.  Neurological: Positive for weakness. Negative for dizziness and speech  difficulty.  All other systems reviewed and are negative.    Physical Exam Updated Vital Signs There were no vitals taken for this visit.  Physical Exam  Constitutional: He is oriented to person, place, and time. He appears well-developed and well-nourished. No distress.  HENT:  Head: Normocephalic and atraumatic.  Mouth/Throat: Oropharynx is clear and moist.  Eyes: Conjunctivae and EOM are normal. Pupils are equal, round, and reactive to light.  Neck: Normal range of motion. Neck supple.  Cardiovascular: Normal rate, regular rhythm and intact distal pulses.   No murmur heard. Pulmonary/Chest: Effort normal and breath sounds normal. No respiratory distress. He has no wheezes. He has no rales.  Abdominal: Soft. He exhibits no distension. There is no tenderness. There is no rebound and no guarding.  Musculoskeletal: Normal range of motion. He exhibits no edema or tenderness.  Neurological: He is alert and oriented to person, place, and time.  Skin: Skin is warm and dry. No rash noted. No erythema.  Psychiatric: He has a normal mood and affect. His behavior is normal.  Nursing note and vitals reviewed.    ED Treatments / Results  Labs (all labs ordered are listed, but only abnormal results are displayed) Labs Reviewed  CBC WITH DIFFERENTIAL/PLATELET  COMPREHENSIVE METABOLIC PANEL  URINALYSIS, ROUTINE W REFLEX MICROSCOPIC (NOT AT [facility])  I-STAT TROPOININ, ED    EKG  EKG Interpretation None       Radiology No results found.  Procedures Procedures (including critical care time)  Medications Ordered in ED Medications  dextrose 10 %  infusion (not administered)     Initial Impression / Assessment and Plan / ED Course  I have reviewed the triage vital signs and the nursing notes.  Pertinent labs & imaging results that were available during my care of the patient were reviewed by me and considered in my medical decision making (see chart for details).  Clinical  Course   Patient is a 65 year old male with a history of diabetes and unknown if he has other medical problems given his caretaker is not present presenting today with hypoglycemia. Patient states he felt tired all day long in her EMS had multiple low blood sugar reads today and upon their arrival today after eating his blood sugar was 30. He ate a sandwich and was given D50 with improvement of his blood sugar to the 70s. Repeat blood sugar here was in the 60s the patient is awake and alert. He denies any fever, chest pain, shortness of breath, abdominal pain, nausea or vomiting. As far as he knows he has had no change in his dose of medication and has not recently changed any of his medications. However his daughter is not present right now to confirm his story.  He also states his diet has been the same as normal today.  Concern for potential silent MI versus infection versus acute renal failure as the cause for his persistent hypoglycemia. Patient was started on D10 given his recurrent hypoglycemic events.  EKG, chest x-ray, CBC, CMP, UA, troponin pending.  11:39 PM EKG within normal limits, chest x-ray within normal limits, CBC and troponin without acute findings. CMP with no acute changes. Patient's renal function is unchanged from last visit.  On D10 drip patient's repeat blood sugar 67 so it was increased to 125 mg/h. UA is pending but will admit patient for further care.   12:13 AM UA concerning for potential UTI. Patient treated with Rocephin  CRITICAL CARE Performed by: Blanchie Dessert Total critical care time: 30 minutes Critical care time was exclusive of separately billable procedures and treating other patients. Critical care was necessary to treat or prevent imminent or life-threatening deterioration. Critical care was time spent personally by me on the following activities: development of treatment plan with patient and/or surrogate as well as nursing, discussions with consultants,  evaluation of patient's response to treatment, examination of patient, obtaining history from patient or surrogate, ordering and performing treatments and interventions, ordering and review of laboratory studies, ordering and review of radiographic studies, pulse oximetry and re-evaluation of patient's condition.   Final Clinical Impressions(s) / ED Diagnoses   Final diagnoses:  Hypoglycemia    New Prescriptions New Prescriptions   No medications on file     Blanchie Dessert, MD 06/16/16 0013

## 2016-06-15 NOTE — ED Triage Notes (Signed)
Pt from home with complaints of hypoglycemia. Pt's daughter takes care of him and takes his blood sugar regularly. Pt was lethargic tonight following a day of low CBG's (Pt's daughter still gave him insulin). Pt was in the 30's for EMS. Pt was given a sandwich by fire and EMS gave 15g of oral glucose. Pt's last cbg by ems was 74. Pt is able to follow commands, but does not speak [place]

## 2016-06-15 NOTE — ED Notes (Signed)
Pt's daughter can be reached to answer questions or to pick him up following dc [place]: [phone]

## 2016-06-15 NOTE — ED Notes (Signed)
Bed: RESA Expected date:  Expected time:  Means of arrival:  Comments: Hypoglycemia

## 2016-06-16 ENCOUNTER — Encounter (HOSPITAL_COMMUNITY): Payer: Self-pay | Admitting: Internal Medicine

## 2016-06-16 ENCOUNTER — Encounter: Payer: Self-pay | Admitting: Internal Medicine

## 2016-06-16 DIAGNOSIS — E1122 Type 2 diabetes mellitus with diabetic chronic kidney disease: Secondary | ICD-10-CM | POA: Diagnosis not present

## 2016-06-16 DIAGNOSIS — N183 Chronic kidney disease, stage 3 unspecified: Secondary | ICD-10-CM | POA: Diagnosis present

## 2016-06-16 DIAGNOSIS — E119 Type 2 diabetes mellitus without complications: Secondary | ICD-10-CM

## 2016-06-16 DIAGNOSIS — E876 Hypokalemia: Secondary | ICD-10-CM | POA: Diagnosis not present

## 2016-06-16 DIAGNOSIS — E162 Hypoglycemia, unspecified: Secondary | ICD-10-CM | POA: Diagnosis not present

## 2016-06-16 DIAGNOSIS — N182 Chronic kidney disease, stage 2 (mild): Secondary | ICD-10-CM | POA: Diagnosis not present

## 2016-06-16 LAB — GLUCOSE, CAPILLARY
GLUCOSE-CAPILLARY: 144 mg/dL — AB (ref 65–99)
GLUCOSE-CAPILLARY: 232 mg/dL — AB (ref 65–99)
GLUCOSE-CAPILLARY: 222 mg/dL — AB (ref 65–99)
GLUCOSE-CAPILLARY: 211 mg/dL — AB (ref 65–99)
GLUCOSE-CAPILLARY: 144 mg/dL — AB (ref 65–99)
Glucose-Capillary: 200 mg/dL — ABNORMAL HIGH (ref 65–99)
Glucose-Capillary: 157 mg/dL — ABNORMAL HIGH (ref 65–99)
Glucose-Capillary: 155 mg/dL — ABNORMAL HIGH (ref 65–99)
Glucose-Capillary: 160 mg/dL — ABNORMAL HIGH (ref 65–99)

## 2016-06-16 LAB — URINE MICROSCOPIC-ADD ON
Bacteria, UA: NONE SEEN
RBC / HPF: NONE SEEN RBC/hpf (ref 0–5)
Squamous Epithelial / LPF: NONE SEEN

## 2016-06-16 LAB — URINALYSIS, ROUTINE W REFLEX MICROSCOPIC
BILIRUBIN URINE: NEGATIVE
GLUCOSE, UA: NEGATIVE mg/dL
Hgb urine dipstick: NEGATIVE
KETONES UR: NEGATIVE mg/dL
NITRITE: NEGATIVE
PH: 5.5 (ref 5.0–8.0)
Protein, ur: NEGATIVE mg/dL
Specific Gravity, Urine: 1.009 (ref 1.005–1.030)

## 2016-06-16 LAB — COMPREHENSIVE METABOLIC PANEL
ALBUMIN: 3.6 g/dL (ref 3.5–5.0)
ALT: 20 U/L (ref 17–63)
AST: 22 U/L (ref 15–41)
Alkaline Phosphatase: 94 U/L (ref 38–126)
Anion gap: 6 (ref 5–15)
BILIRUBIN TOTAL: 0.3 mg/dL (ref 0.3–1.2)
BUN: 15 mg/dL (ref 6–20)
CO2: 26 mmol/L (ref 22–32)
Calcium: 8.9 mg/dL (ref 8.9–10.3)
Chloride: 105 mmol/L (ref 101–111)
Creatinine, Ser: 1.06 mg/dL (ref 0.61–1.24)
GFR calc Af Amer: >60 mL/min (ref >60)
GFR calc non Af Amer: >60 mL/min (ref >60)
GLUCOSE: 214 mg/dL — AB (ref 65–99)
POTASSIUM: 3.9 mmol/L (ref 3.5–5.1)
Sodium: 137 mmol/L (ref 135–145)
TOTAL PROTEIN: 7.9 g/dL (ref 6.5–8.1)

## 2016-06-16 LAB — CBC
HEMATOCRIT: 34.7 % — AB (ref 39.0–52.0)
Hemoglobin: 11.7 g/dL — ABNORMAL LOW (ref 13.0–17.0)
MCH: 29.9 pg (ref 26.0–34.0)
MCHC: 33.7 g/dL (ref 30.0–36.0)
MCV: 88.7 fL (ref 78.0–100.0)
Platelets: 190 10*3/uL (ref 150–400)
RBC: 3.91 MIL/uL — ABNORMAL LOW (ref 4.22–5.81)
RDW: 13.5 % (ref 11.5–15.5)
WBC: 6 10*3/uL (ref 4.0–10.5)

## 2016-06-16 LAB — PHOSPHORUS: PHOSPHORUS: 2.8 mg/dL (ref 2.5–4.6)

## 2016-06-16 LAB — TSH: TSH: 1.364 u[IU]/mL (ref 0.350–4.500)

## 2016-06-16 LAB — CBG MONITORING, ED
Glucose-Capillary: 258 mg/dL — ABNORMAL HIGH (ref 65–99)
Glucose-Capillary: 261 mg/dL — ABNORMAL HIGH (ref 65–99)

## 2016-06-16 LAB — MAGNESIUM: Magnesium: 1.9 mg/dL (ref 1.7–2.4)

## 2016-06-16 MED ORDER — ACETAMINOPHEN 650 MG RE SUPP: 650.0000 mg | Freq: Four times a day (QID) | RECTAL | Status: DC | PRN

## 2016-06-16 MED ORDER — POTASSIUM CHLORIDE CRYS ER 20 MEQ PO TBCR
20.0000 meq | EXTENDED_RELEASE_TABLET | Freq: Once | ORAL | Status: AC
  Administered 2016-06-16: 20 meq via ORAL
  Filled 2016-06-16: qty 1

## 2016-06-16 MED ORDER — ACETAMINOPHEN 325 MG PO TABS: 650.0000 mg | ORAL_TABLET | Freq: Four times a day (QID) | ORAL | Status: DC | PRN

## 2016-06-16 MED ORDER — ENOXAPARIN SODIUM 30 MG/0.3ML ~~LOC~~ SOLN
30.0000 mg | SUBCUTANEOUS | Status: DC
  Administered 2016-06-16 – 2016-06-19 (×4): 30 mg via SUBCUTANEOUS
  Filled 2016-06-16 (×4): qty 0.3

## 2016-06-16 MED ORDER — ONDANSETRON HCL 4 MG PO TABS: 4.0000 mg | ORAL_TABLET | Freq: Four times a day (QID) | ORAL | Status: DC | PRN

## 2016-06-16 MED ORDER — SODIUM CHLORIDE 0.9 % IV SOLN
INTRAVENOUS | Status: AC
Start: 2016-06-16 — End: 2016-06-16
  Administered 2016-06-16: 05:00:00 via INTRAVENOUS

## 2016-06-16 MED ORDER — HYDROCODONE-ACETAMINOPHEN 5-325 MG PO TABS: 1.0000 | ORAL_TABLET | ORAL | Status: DC | PRN

## 2016-06-16 MED ORDER — VITAMIN B-1 100 MG PO TABS
100.0000 mg | ORAL_TABLET | Freq: Every day | ORAL | Status: DC
  Administered 2016-06-16 – 2016-06-20 (×5): 100 mg via ORAL
  Filled 2016-06-16 (×5): qty 1

## 2016-06-16 MED ORDER — ONDANSETRON HCL 4 MG/2ML IJ SOLN: 4.0000 mg | Freq: Four times a day (QID) | INTRAMUSCULAR | Status: DC | PRN

## 2016-06-16 MED ORDER — DEXTROSE 10 % IV SOLN
INTRAVENOUS | Status: DC
  Filled 2016-06-16: qty 1000

## 2016-06-16 MED ORDER — CEFTRIAXONE SODIUM 1 G IJ SOLR
1.0000 g | Freq: Once | INTRAMUSCULAR | Status: AC
  Administered 2016-06-16: 1 g via INTRAVENOUS
  Filled 2016-06-16: qty 10

## 2016-06-16 MED ORDER — ASPIRIN EC 81 MG PO TBEC
81.0000 mg | DELAYED_RELEASE_TABLET | Freq: Every day | ORAL | Status: DC
  Administered 2016-06-16 – 2016-06-20 (×5): 81 mg via ORAL
  Filled 2016-06-16 (×5): qty 1

## 2016-06-16 NOTE — Evaluation (Addendum)
Occupational Therapy One Time Evaluation Patient Details Name: Brian Owen MRN: [id number] DOB: [birth year] Today's Date: 06/16/2016    History of Present Illness Brian Owen is a 65 y.o. male with medical history significant of diabetes mellitus, admitted 06/15/16 with low blood sugar and lethargy.    Clinical Impression   Pt appears to be at baseline with functional transfers and ADL. Donned and doffed shoes at EOB without difficulty and transferred on and off toilet using bar without difficulty. Used walker for functional transfers in the room and no LOB noted. Will sign off. No acute OT needs.    Follow Up Recommendations  No OT follow up   Equipment Recommendations  None recommended by OT    Recommendations for Other Services       Precautions / Restrictions Precautions Precautions: None Restrictions Weight Bearing Restrictions: No      Mobility Bed Mobility Overal bed mobility: Independent                Transfers Overall transfer level: Independent Equipment used: None                  Balance Overall balance assessment: Independent                                          ADL Overall ADL's : At baseline:  Pt able to don and doff shoes at EOB without difficulty. Up in room with walker for functional mobility without LOB. Pt transferred on and off toilet using grab bar but states he uses the vanity to help him stand at home. Feel pt is close or at baseline with ADL. No issues noted with self care tasks.                                             Vision     Perception     Praxis      Pertinent Vitals/Pain Pain Assessment: No/denies pain     Hand Dominance     Extremity/Trunk Assessment Upper Extremity Assessment Upper Extremity Assessment: Defer to OT evaluation          Communication Communication Communication: Prefers language other than English   Cognition Arousal/Alertness:  Awake/alert Behavior During Therapy: WFL for tasks assessed/performed Overall Cognitive Status: Within Functional Limits for tasks assessed                     General Comments       Exercises       Shoulder Instructions      Home Living Family/patient expects to be discharged to:: Private residence Living Arrangements: Spouse/significant other;Other relatives Available Help at Discharge: Family;Available 24 hours/day Type of Home: House Home Access: Stairs to enter           Bathroom Shower/Tub: Tub/shower unit   [employer]: Standard     Home Equipment: Environmental consultant - 2 wheels   Additional Comments: states wife is home with him. Unable to determine from pt how many stairs he has to enter home or if his house is multilevel.       Prior Functioning/Environment Level of Independence: Needs assistance    ADL's / Homemaking Assistance Needed: uses a walker for mobility. States wife does the cooking and household tasks.  OT Diagnosis: Generalized weakness   OT Problem List:     OT Treatment/Interventions:      OT Goals(Current goals can be found in the care plan section) Acute Rehab OT Goals Patient Stated Goal: home tomorrow  OT Frequency:     Barriers to D/C:            Co-evaluation              End of Session Equipment Utilized During Treatment: Rolling walker  Activity Tolerance: Patient tolerated treatment well Patient left: in bed;with call bell/phone within reach   Time: 1437-1450 OT Time Calculation (min): 13 min Charges:  OT General Charges $OT Visit: 1 Procedure OT Evaluation $OT Eval Low Complexity: 1 Procedure G-Codes: OT G-codes **NOT FOR INPATIENT CLASS** Functional Assessment Tool Used: clinical judgement Functional Limitation: Self care Self Care Current Status [id number] 0 percent impaired, limited or restricted Self Care Goal Status [id number]): 0 percent impaired, limited or restricted Self Care Discharge Status  [id number]): 0 percent impaired, limited or restricted  [name] 06/16/2016, 4:15 PM

## 2016-06-16 NOTE — Progress Notes (Signed)
Patient seen and evaluated earlier the same by my associate. Please refer to H&P for details regarding assessment and plan.  We'll discontinue dextrose infusion and continue to monitor blood sugars  Brian Owen  Will reassess next am.  Gen: pt in nad, alert and awake Cv: no cyanosis Pulm: no wheezes

## 2016-06-16 NOTE — ED Notes (Signed)
Notified Dr. Maryan Rued of 258 blood sugar.

## 2016-06-16 NOTE — Progress Notes (Addendum)
Inpatient Diabetes Program Recommendations  AACE/ADA: New Consensus Statement on Inpatient Glycemic Control (2015)  Target Ranges:  Prepandial:   less than 140 mg/dL      Peak postprandial:   less than 180 mg/dL (1-2 hours)      Critically ill patients:  140 - 180 mg/dL   Results for Mills, [facility] (MRN [id number]) as of 06/16/2016 12:30  Ref. Range 06/16/2016 04:45 06/16/2016 06:42 06/16/2016 08:09 06/16/2016 10:10 06/16/2016 12:07  Glucose-Capillary Latest Ref Range: 65 - 99 mg/dL 200 (H) 157 (H) 144 (H) 155 (H) 232 (H)   Review of Glycemic Control  Outpatient Diabetes medications: Novolog 70/30 12 units BID  Current orders for Inpatient glycemic control: CBGs Q2H  Inpatient Diabetes Program Recommendations:  IV fluids: Please consider discontinuing dextrose in IV fluids since glucose is now 232 mg/dl and patient is eating. Correction (SSI): Blood glucose over the past 8 hours has ranged from 144-232 mg/dl. Please consider changing frequency of CBGs to Q4H and order Novolog sensitive correction scale Q4H. Diet: Please change diet from Regular to Carb Modified.  NOTE: Noted consult for diabetes coordinator. Chart reviewed. Diabetes Coordination is not on campus over the weekend but available from 8am-5pm for questions or concerns (pager: [phone]). Talked with Joellen Jersey, RN and she reports that no family is with patient at this time and that patient can understand some basic English but can not speak much English at all. Will try to call patient's daughter to discuss diabetes, insulin, and hypoglycemia. Bedside NURSING: Please use each patient interaction to provide diabetes education. Please allow patient to be actively engaged with diabetes management by allowing patient to check own glucose and self-administer insulin injections. Diabetes Coordinator will continue to follow while inpatient.  Addendum 06/16/16@13 :69- Spoke with patient's daughter Gloris Manchester) over the phone about diabetes and home  regimen for diabetes control. Ms. Earnestine Mealing reports that her father is going to the [facility]) for diabetes management. However, there are no notes in the chart to confirm that he has been seen at the [facility]. In further discussion, Ms. Earnestine Mealing states that her father is suppose to start going there and she has not made him an appointment yet. Encouraged her to call [facility] on Monday morning to make an appointment as soon as possible to establish care there. Ms. Earnestine Mealing has patient's insulin bottle there with her and she states that the insulin is Novolog 70/30 and that her father is prescribed 70/30 12 units BID with breakfast and supper. Ms. Earnestine Mealing reports that she is administering insulin to her father as prescribed. Ms. Earnestine Mealing states that she checks her fathers glucose 4 times per day (before meals and at bedtime) and that it is usually in the 130-140's mg/dl range. However, she reports that his glucose was 21 mg/dl prior to being admitted to the hospital.  Discussed glucose and A1C goals. Discussed hypoglycemia along with proper treatment. Explained to Ms. Earnestine Mealing that if her father's glucose is low and she administers insulin it will make it go lower especially if he is not eating. Encouraged Ms. Liam to let the doctor at the [facility] know about the recent hypoglycemia and ask for recommendations with the insulin if patient has hypoglycemia (such as should she not give at all or give half of the dose after patient eats). Discussed importance of checking CBGs and maintaining good CBG control to prevent long-term and short-term complications. Discussed impact of nutrition, exercise, stress, sickness, and medications on diabetes control.Ms. Earnestine Mealing  reports that her father is fairly active so discussed how the body uses glucose for energy. Encouraged Ms. Liam to continue checking her father's glucose 4 times per day (before meals and at bedtime) and to keep a log book of glucose readings and  insulin taken which he will need to take to doctor appointments. Explained how the doctor her father follows up with can use the log book to continue to make insulin adjustments if needed. Ms. Earnestine Mealing verbalized understanding of information discussed and she states that she has no further questions at this time related to diabetes.  Thanks, Barnie Alderman, RN, MSN, CDE Diabetes Coordinator Inpatient Diabetes Program [phone] (Team Pager from [place] to [place]) [phone] ([place] office) [phone] [facility] office) [phone] [facility] office)

## 2016-06-16 NOTE — H&P (Signed)
Brian Owen [id number] DOB: 1951/05/16 DOA: 06/15/2016     PCP:  [name] Outpatient Specialists:none  Patient coming from:   home Lives  With family   Chief Complaint: low blood sugar  HPI: Brian Owen is a 65 y.o. male with medical history significant of diabetes mellitus, poor nutrition, decubitus ulcers, tobacco abuse    Presented with low blood sugar from home tonight he was very lethargic all day he have run low blood sugars. Nonetheless his daughter still give him insulin. EMS was called his blood sugar was in the 30s. He was given a sandwich and D50 glucose CABG improved to 74 but on arrival to emerge department was down into the 60s. Patient is not speak [place]. He reports feeling tired all day long have had decreased by mouth intake is not aware of any changes in his medications. Apparently his daughter is unsure about what the name of medications aren't but states that he's been taking only one medicine twice a day. She denies any chest sprain no recent falls   Regarding pertinent Chronic problems: He has known history of diabetes he have had prior admissions for hypoglycemia in the past usually associated with UTI   IN ER: Afebrile satting 100% blood pressure 177/94 heart rate 79 WBC 7.7 hemoglobin 11.5 Sodium 139 potassium 3.5 creatinine 1.39 albumin 3.9 glucose 67 currently up to 258 Patient was started initially and D10 at 75,000 now but had to be increased to 125 to maintain blood sugars  Chest x-ray unremarkable,   Hospitalist was called for admission for persistent hypoglycemia  Review of Systems:    Pertinent positives include:  Fatigue, confusion decreased by mouth intake  Constitutional:  No weight loss, night sweats, Fevers, chills, weight loss  HEENT:  No headaches, Difficulty swallowing,Tooth/dental problems,Sore throat,  No sneezing, itching, ear ache, nasal congestion, post nasal drip,  Cardio-vascular:  No chest pain, Orthopnea, PND, anasarca,  dizziness, palpitations.no Bilateral lower extremity swelling  GI:  No heartburn, indigestion, abdominal pain, nausea, vomiting, diarrhea, change in bowel habits, loss of appetite, melena, blood in stool, hematemesis Resp:  no shortness of breath at rest. No dyspnea on exertion, No excess mucus, no productive cough, No non-productive cough, No coughing up of blood.No change in color of mucus.No wheezing. Skin:  no rash or lesions. No jaundice GU:  no dysuria, change in color of urine, no urgency or frequency. No straining to urinate.  No flank pain.  Musculoskeletal:  No joint pain or no joint swelling. No decreased range of motion. No back pain.  Psych:  No change in mood or affect. No depression or anxiety. No memory loss.  Neuro: no localizing neurological complaints, no tingling, no weakness, no double vision, no gait abnormality, no slurred speech, no confusion  As per HPI otherwise 10 point review of systems negative.   Past Medical History: Past Medical History:  Diagnosis Date  . Diabetes mellitus without complication ([place])    No past surgical history on file.   Social History:  Ambulatory   independently      reports that he has quit smoking. He has never used smokeless tobacco. He reports that he does not drink alcohol or use drugs.  Allergies:  No Known Allergies    Family History:   Family History  Problem Relation Age of Onset  . Diabetes Other     Medications: Prior to Admission medications   Not on File    Physical Exam: Patient Vitals for the past 24  hrs:  BP Temp Temp src Pulse Resp SpO2  06/15/16 2359 177/94 97.5 F (36.4 C) Oral 79 13 100 %  06/15/16 2302 149/81 - - 73 15 99 %  06/15/16 2228 142/83 97.4 F (36.3 C) Oral 77 13 100 %    1. General:  in No Acute distress 2. Psychological: Alert and  Oriented 3. Head/ENT:     Dry Mucous Membranes                          Head Non traumatic, neck supple                            Poor  Dentition 4. SKIN:  decreased Skin turgor,  Skin clean Dry and intact no rash multiple scars 5. Heart: Regular rate and rhythm no  Murmur, Rub or gallop 6. Lungs:   no wheezes or crackles   7. Abdomen: Soft,  non-tender, Non distended 8. Lower extremities: no clubbing, cyanosis, or edema 9. Neurologically Grossly intact, moving all 4 extremities equally   10. MSK: Normal range of motion   body mass index is unknown because there is no height or weight on file.  Labs on Admission:   Labs on Admission: I have personally reviewed following labs and imaging studies  CBC:  Recent Labs Lab 06/15/16 2210  WBC 7.7  NEUTROABS 5.0  HGB 11.5*  HCT 34.9*  MCV 90.4  PLT 99991111   Basic Metabolic Panel:  Recent Labs Lab 06/15/16 2210  NA 139  K 3.5  CL 109  CO2 25  GLUCOSE 84  BUN 16  CREATININE 1.39*  CALCIUM 9.2   GFR: CrCl cannot be calculated (Unknown ideal weight.). Liver Function Tests:  Recent Labs Lab 06/15/16 2210  AST 25  ALT 21  ALKPHOS 97  BILITOT 0.4  PROT 8.1  ALBUMIN 3.9   No results for input(s): LIPASE, AMYLASE in the last 168 hours. No results for input(s): AMMONIA in the last 168 hours. Coagulation Profile: No results for input(s): INR, PROTIME in the last 168 hours. Cardiac Enzymes: No results for input(s): CKTOTAL, CKMB, CKMBINDEX, TROPONINI in the last 168 hours. BNP (last 3 results) No results for input(s): PROBNP in the last 8760 hours. HbA1C: No results for input(s): HGBA1C in the last 72 hours. CBG:  Recent Labs Lab 06/15/16 2146  GLUCAP 67   Lipid Profile: No results for input(s): CHOL, HDL, LDLCALC, TRIG, CHOLHDL, LDLDIRECT in the last 72 hours. Thyroid Function Tests: No results for input(s): TSH, T4TOTAL, FREET4, T3FREE, THYROIDAB in the last 72 hours. Anemia Panel: No results for input(s): VITAMINB12, FOLATE, FERRITIN, TIBC, IRON, RETICCTPCT in the last 72 hours. Urine analysis:    Component Value Date/Time   COLORURINE  YELLOW 06/15/2016 2334   APPEARANCEUR CLEAR 06/15/2016 2334   LABSPEC 1.009 06/15/2016 2334   PHURINE 5.5 06/15/2016 2334   GLUCOSEU NEGATIVE 06/15/2016 2334   HGBUR NEGATIVE 06/15/2016 2334   BILIRUBINUR NEGATIVE 06/15/2016 2334   KETONESUR NEGATIVE 06/15/2016 2334   PROTEINUR NEGATIVE 06/15/2016 2334   NITRITE NEGATIVE 06/15/2016 2334   LEUKOCYTESUR MODERATE (A) 06/15/2016 2334   Sepsis Labs: @LABRCNTIP (procalcitonin:4,lacticidven:4) )No results found for this or any previous visit (from the past 240 hour(s)).     UA   no evidence of UTI  6-30 WBC  No results found for: HGBA1C  CrCl cannot be calculated (Unknown ideal weight.).  BNP (last 3 results) No  results for input(s): PROBNP in the last 8760 hours.   ECG REPORT  Independently reviewed Rate: 89  Rhythm: Normal sinus rhythm ST&T Change: No acute ischemic changes   QTC 454  There were no vitals filed for this visit.   Cultures: No results found for: SDES, [place], CULT, REPTSTATUS   Radiological Exams on Admission: Dg Chest 2 View  Result Date: 06/15/2016 CLINICAL DATA:  Initial evaluation for acute hypoglycemia. EXAM: CHEST  2 VIEW COMPARISON:  None. FINDINGS: Cardiac and mediastinal silhouettes are within normal limits. Lungs are normally inflated. No focal infiltrate, pulmonary edema, or pleural effusion. No pneumothorax. No acute osseous abnormality. IMPRESSION: No active cardiopulmonary disease. Electronically Signed   By: Jeannine Boga M.D.   On: 06/15/2016 22:27    Chart has been reviewed    Assessment/Plan  65 y.o. male with medical history significant of diabetes mellitus, poor nutrition, decubitus ulcers, tobacco abuse here with persistent hypoglycemia currently improving on D10   Present on Admission: . Hypoglycemia - encourage by mouth intake given hyperglycemia now will decrease D10 rate hold home medications, order diabetes coordinator consult, to avoid hyponatremia will add normal  saline fluids check electrolytes in 6 hours  . CKD (chronic kidney disease) creatinine currently at baseline continue to monitor Diabetes mellitus poorly controlled - during prior has better evaluation patient was on NPH 7037 units twice a day. Reviewing the records it seems to his PCP has increased it up to 14 back in April. It is unsure clear what dosages currently being used. We'll need to contact family in the morning to clarify. We'll hold for now. check hemoglobin A1c order diabetic coordinator consult given difficult to control diabetes will need to have discussion with family in the morning regarding appropriate management Abnormal UA no evidence of bacteria nitrates. Will obtain urine culture and follow results hold off on antibiotics for now patient ready received 1 dose of ceftriaxone in the ER Other plan as per orders.  DVT prophylaxis:    Lovenox     Code Status:  FULL CODE    Family Communication:   Family not at  Bedside  Daughter number [phone]  Disposition Plan:   To home once workup is complete and patient is stable   Consults called: none  Admission status:   obs   Level of care       medical floor        I have spent a total of 54 min on this admission   Sadiq Mccauley 06/16/2016, 1:24 AM    [facility]  Pager [phone]   after 2 AM please page floor coverage PA If 7AM-7PM, please contact the day team taking care of the patient  [email or web address]  Password [email or web address]

## 2016-06-16 NOTE — Evaluation (Signed)
Physical Therapy Evaluation Patient Details Name: Brian Owen MRN: [id number] DOB: 10-Sep-1951 Today's Date: 06/16/2016   History of Present Illness  Brian Owen is a 65 y.o. male with medical history significant of diabetes mellitus, admitted 06/15/16 with low blood sugar and lethargy.   Clinical Impression  The patient is very mobilzie today. Required no assistance although he chose to ambulate  With a RW which the patient has . No further PT needs.    Follow Up Recommendations No PT follow up    Equipment Recommendations  None recommended by PT    Recommendations for Other Services       Precautions / Restrictions Precautions Precautions: None Restrictions Weight Bearing Restrictions: No      Mobility  Bed Mobility Overal bed mobility: Independent                Transfers Overall transfer level: Independent Equipment used: None                Ambulation/Gait Ambulation/Gait assistance: Modified independent (Device/Increase time) Ambulation Distance (Feet): 200 Feet Assistive device: Rolling walker (2 wheeled) Gait Pattern/deviations: Step-through pattern        Stairs            Wheelchair Mobility    Modified Rankin (Stroke Patients Only)       Balance Overall balance assessment: Independent                                           Pertinent Vitals/Pain Pain Assessment: No/denies pain    Home Living Family/patient expects to be discharged to:: Private residence Living Arrangements: Spouse/significant other;Other relatives Available Help at Discharge: Family;Available 24 hours/day Type of Home: House Home Access: Stairs to enter       Home Equipment: Gilford Rile - 2 wheels Additional Comments: states wife is home with him. Unable to determine from pt how many stairs he has to enter home or if his house is multilevel.     Prior Function Level of Independence: Needs assistance      ADL's / Homemaking Assistance  Needed: uses a walker for mobility. States wife does the cooking and household tasks.         Hand Dominance        Extremity/Trunk Assessment   Upper Extremity Assessment: Defer to OT evaluation           Lower Extremity Assessment: Overall WFL for tasks assessed         Communication   Communication: Prefers language other than English  Cognition Arousal/Alertness: Awake/alert Behavior During Therapy: WFL for tasks assessed/performed Overall Cognitive Status: Within Functional Limits for tasks assessed                      General Comments      Exercises        Assessment/Plan    PT Assessment Patent does not need any further PT services  PT Diagnosis     PT Problem List    PT Treatment Interventions     PT Goals (Current goals can be found in the Care Plan section) Acute Rehab PT Goals Patient Stated Goal: home tomorrow. PT Goal Formulation: All assessment and education complete, DC therapy    Frequency     Barriers to discharge        Co-evaluation  End of Session   Activity Tolerance: Patient tolerated treatment well Patient left: in bed;with call bell/phone within reach Nurse Communication: Mobility status    Functional Assessment Tool Used: clinical judgement Functional Limitation: Mobility: Walking and moving around Mobility: Walking and Moving Around Current Status [phone]): 0 percent impaired, limited or restricted Mobility: Walking and Moving Around Goal Status [phone]): 0 percent impaired, limited or restricted Mobility: Walking and Moving Around Discharge Status [phone]): 0 percent impaired, limited or restricted    Time: 1437-1450 PT Time Calculation (min) (ACUTE ONLY): 13 min   Charges:   PT Evaluation $PT Eval Low Complexity: 1 Procedure     PT G Codes:   PT G-Codes **NOT FOR INPATIENT CLASS** Functional Assessment Tool Used: clinical judgement Functional Limitation: Mobility: Walking and moving  around Mobility: Walking and Moving Around Current Status [id number]): 0 percent impaired, limited or restricted Mobility: Walking and Moving Around Goal Status [id number]): 0 percent impaired, limited or restricted Mobility: Walking and Moving Around Discharge Status [phone]): 0 percent impaired, limited or restricted    Claretha Cooper 06/16/2016, 4:11 PM  Tresa Endo PT [phone]

## 2016-06-17 DIAGNOSIS — I951 Orthostatic hypotension: Secondary | ICD-10-CM | POA: Diagnosis not present

## 2016-06-17 DIAGNOSIS — E1122 Type 2 diabetes mellitus with diabetic chronic kidney disease: Secondary | ICD-10-CM | POA: Diagnosis not present

## 2016-06-17 DIAGNOSIS — Z833 Family history of diabetes mellitus: Secondary | ICD-10-CM | POA: Diagnosis not present

## 2016-06-17 DIAGNOSIS — R42 Dizziness and giddiness: Secondary | ICD-10-CM | POA: Diagnosis not present

## 2016-06-17 DIAGNOSIS — B952 Enterococcus as the cause of diseases classified elsewhere: Secondary | ICD-10-CM | POA: Diagnosis present

## 2016-06-17 DIAGNOSIS — N183 Chronic kidney disease, stage 3 (moderate): Secondary | ICD-10-CM | POA: Diagnosis not present

## 2016-06-17 DIAGNOSIS — N189 Chronic kidney disease, unspecified: Secondary | ICD-10-CM | POA: Diagnosis present

## 2016-06-17 DIAGNOSIS — R55 Syncope and collapse: Secondary | ICD-10-CM | POA: Diagnosis not present

## 2016-06-17 DIAGNOSIS — Z87891 Personal history of nicotine dependence: Secondary | ICD-10-CM | POA: Diagnosis not present

## 2016-06-17 DIAGNOSIS — N390 Urinary tract infection, site not specified: Secondary | ICD-10-CM | POA: Diagnosis present

## 2016-06-17 DIAGNOSIS — E162 Hypoglycemia, unspecified: Secondary | ICD-10-CM | POA: Diagnosis not present

## 2016-06-17 DIAGNOSIS — E11649 Type 2 diabetes mellitus with hypoglycemia without coma: Secondary | ICD-10-CM | POA: Diagnosis present

## 2016-06-17 LAB — HEMOGLOBIN A1C
HEMOGLOBIN A1C: 7.8 % — AB (ref 4.8–5.6)
Mean Plasma Glucose: 177 mg/dL

## 2016-06-17 LAB — GLUCOSE, CAPILLARY
GLUCOSE-CAPILLARY: 122 mg/dL — AB (ref 65–99)
GLUCOSE-CAPILLARY: 134 mg/dL — AB (ref 65–99)
GLUCOSE-CAPILLARY: 190 mg/dL — AB (ref 65–99)
Glucose-Capillary: 119 mg/dL — ABNORMAL HIGH (ref 65–99)
Glucose-Capillary: 175 mg/dL — ABNORMAL HIGH (ref 65–99)
Glucose-Capillary: 130 mg/dL — ABNORMAL HIGH (ref 65–99)

## 2016-06-17 MED ORDER — SODIUM CHLORIDE 0.9 % IV SOLN
INTRAVENOUS | Status: DC
  Administered 2016-06-17 – 2016-06-19 (×4): via INTRAVENOUS

## 2016-06-17 NOTE — Progress Notes (Signed)
PROGRESS NOTE    Brian Owen  [id number] DOB: November 16, 1950 DOA: 06/15/2016 PCP: No primary care provider on file.   Brief Narrative: 65 year old who presented lethargic with hypoglycemia. He reported poor oral intake and was admitted for further evaluation recommendations. Hospital stay has been complicated by hypoglycemia and orthostatic hypotension   Assessment & Plan:   Active Problems:   Hypoglycemia - resolved    CKD (chronic kidney disease) - Stable currently. Serum creatinine trending down    DM type 2 (diabetes mellitus, type 2) ([place]) - Patient was hypoglycemic recently. - We'll plan on controlling with diet modification    Orthostatic hypotension - No reported problem. We'll place on normal saline overnight - place ted hose   DVT prophylaxis: ted hose Code Status: Full Family Communication: d/c patient directly Disposition Plan: DC next a.m. with resolution of orthostatic hypotension   Consultants:   None   Procedures: None   Antimicrobials: None   Subjective: Pt has no new complaints.   Objective: Vitals:   06/16/16 2146 06/17/16 0646 06/17/16 1000 06/17/16 1424  BP: 112/68 134/76  131/64  Pulse: 78 77  85  Resp: 17 16  17   Temp: 98.2 F (36.8 C) 97.8 F (36.6 C) 98 F (36.7 C) 97.8 F (36.6 C)  TempSrc: Oral Oral Oral Oral  SpO2: 100% 100%  100%  Weight:        Intake/Output Summary (Last 24 hours) at 06/17/16 1659 Last data filed at 06/17/16 1330  Gross per 24 hour  Intake              900 ml  Output                0 ml  Net              900 ml   Filed Weights   06/16/16 0259  Weight: 52.4 kg (115 lb 8.3 oz)    Examination:  General exam: Appears calm and comfortable, in nad. Respiratory system: Clear to auscultation. Respiratory effort normal. Cardiovascular system: S1 & S2 heard, RRR. No JVD, murmurs, rubs, gallops or clicks. No pedal edema. Gastrointestinal system: Abdomen is nondistended, soft and nontender. No  organomegaly or masses felt. Normal bowel sounds heard. Central nervous system: Alert and oriented. No focal neurological deficits. Extremities: Symmetric 5 x 5 power. Skin: No rashes, lesions or ulcers Psychiatry:  Mood & affect appropriate.   Data Reviewed: I have personally reviewed following labs and imaging studies  CBC:  Recent Labs Lab 06/15/16 2210 06/16/16 0429  WBC 7.7 6.0  NEUTROABS 5.0  --   HGB 11.5* 11.7*  HCT 34.9* 34.7*  MCV 90.4 88.7  PLT 190 99991111   Basic Metabolic Panel:  Recent Labs Lab 06/15/16 2210 06/16/16 0429  NA 139 137  K 3.5 3.9  CL 109 105  CO2 25 26  GLUCOSE 84 214*  BUN 16 15  CREATININE 1.39* 1.06  CALCIUM 9.2 8.9  MG  --  1.9  PHOS  --  2.8   GFR: CrCl cannot be calculated (Unknown ideal weight.). Liver Function Tests:  Recent Labs Lab 06/15/16 2210 06/16/16 0429  AST 25 22  ALT 21 20  ALKPHOS 97 94  BILITOT 0.4 0.3  PROT 8.1 7.9  ALBUMIN 3.9 3.6   No results for input(s): LIPASE, AMYLASE in the last 168 hours. No results for input(s): AMMONIA in the last 168 hours. Coagulation Profile: No results for input(s): INR, PROTIME in the last 168 hours. Cardiac  Enzymes: No results for input(s): CKTOTAL, CKMB, CKMBINDEX, TROPONINI in the last 168 hours. BNP (last 3 results) No results for input(s): PROBNP in the last 8760 hours. HbA1C:  Recent Labs  06/16/16 0429  HGBA1C 7.8*   CBG:  Recent Labs Lab 06/17/16 0130 06/17/16 0645 06/17/16 0821 06/17/16 1219 06/17/16 1638  GLUCAP 122* 134* 119* 175* 190*   Lipid Profile: No results for input(s): CHOL, HDL, LDLCALC, TRIG, CHOLHDL, LDLDIRECT in the last 72 hours. Thyroid Function Tests:  Recent Labs  06/16/16 0429  TSH 1.364   Anemia Panel: No results for input(s): VITAMINB12, FOLATE, FERRITIN, TIBC, IRON, RETICCTPCT in the last 72 hours. Sepsis Labs: No results for input(s): PROCALCITON, LATICACIDVEN in the last 168 hours.  No results found for this or any  previous visit (from the past 240 hour(s)).    Radiology Studies: Dg Chest 2 View  Result Date: 06/15/2016 CLINICAL DATA:  Initial evaluation for acute hypoglycemia. EXAM: CHEST  2 VIEW COMPARISON:  None. FINDINGS: Cardiac and mediastinal silhouettes are within normal limits. Lungs are normally inflated. No focal infiltrate, pulmonary edema, or pleural effusion. No pneumothorax. No acute osseous abnormality. IMPRESSION: No active cardiopulmonary disease. Electronically Signed   By: Jeannine Boga M.D.   On: 06/15/2016 22:27    Scheduled Meds: . aspirin EC  81 mg Oral Daily  . enoxaparin (LOVENOX) injection  30 mg Subcutaneous Q24H  . thiamine  100 mg Oral Daily   Continuous Infusions:    LOS: 0 days    Time spent: > 35 minutes Velvet Bathe, MD [facility] Pager [phone]  If 7PM-7AM, please contact night-coverage [email or web address] Password [email or web address] 06/17/2016, 4:59 PM

## 2016-06-18 LAB — URINE CULTURE: Culture: 70000 — AB

## 2016-06-18 LAB — GLUCOSE, CAPILLARY
Glucose-Capillary: 124 mg/dL — ABNORMAL HIGH (ref 65–99)
Glucose-Capillary: 134 mg/dL — ABNORMAL HIGH (ref 65–99)
Glucose-Capillary: 151 mg/dL — ABNORMAL HIGH (ref 65–99)

## 2016-06-18 NOTE — Progress Notes (Signed)
PROGRESS NOTE    Brian Owen  [id number] DOB: 09-22-1951 DOA: 06/15/2016 PCP: No primary care provider on file.   Brief Narrative: 65 year old who presented lethargic with hypoglycemia. He reported poor oral intake and was admitted for further evaluation recommendations. Hospital stay has been complicated by hypoglycemia and orthostatic hypotension   Assessment & Plan:   Active Problems:   Hypoglycemia - resolved    CKD (chronic kidney disease) - Stable currently. Serum creatinine trending down    DM type 2 (diabetes mellitus, type 2) ([place]) - Patient was hypoglycemic recently. - We'll plan on controlling with diet modification    Orthostatic hypotension - increase IVF. Patient became dizzy today while in the bathroom and almost fell back. Will monitor another 23 hours.  - order placed to place ted hose   DVT prophylaxis: ted hose Code Status: Full Family Communication: d/c patient directly Disposition Plan: DC next a.m. with resolution of orthostatic hypotension   Consultants:   None   Procedures: None   Antimicrobials: None   Subjective: Pt has no new complaints.   Objective: Vitals:   06/17/16 1424 06/17/16 2046 06/18/16 0522 06/18/16 1345  BP: 131/64  (!) 119/56   Pulse: 85 78 85   Resp: 17 18 16    Temp: 97.8 F (36.6 C) 98 F (36.7 C) 98.2 F (36.8 C) 98.2 F (36.8 C)  TempSrc: Oral Oral Oral Oral  SpO2: 100% 100% 99% 100%  Weight:        Intake/Output Summary (Last 24 hours) at 06/18/16 1552 Last data filed at 06/18/16 1348  Gross per 24 hour  Intake              840 ml  Output              802 ml  Net               38 ml   Filed Weights   06/16/16 0259  Weight: 52.4 kg (115 lb 8.3 oz)    Examination:  General exam: Appears calm and comfortable, in nad. Respiratory system: Clear to auscultation. Respiratory effort normal. Cardiovascular system: S1 & S2 heard, RRR. No JVD, murmurs, rubs, gallops or clicks. No pedal  edema. Gastrointestinal system: Abdomen is nondistended, soft and nontender. No organomegaly or masses felt. Normal bowel sounds heard. Central nervous system: Alert and oriented. No focal neurological deficits. Extremities: Symmetric 5 x 5 power. Skin: No rashes, lesions or ulcers Psychiatry:  Mood & affect appropriate.   Data Reviewed: I have personally reviewed following labs and imaging studies  CBC:  Recent Labs Lab 06/15/16 2210 06/16/16 0429  WBC 7.7 6.0  NEUTROABS 5.0  --   HGB 11.5* 11.7*  HCT 34.9* 34.7*  MCV 90.4 88.7  PLT 190 99991111   Basic Metabolic Panel:  Recent Labs Lab 06/15/16 2210 06/16/16 0429  NA 139 137  K 3.5 3.9  CL 109 105  CO2 25 26  GLUCOSE 84 214*  BUN 16 15  CREATININE 1.39* 1.06  CALCIUM 9.2 8.9  MG  --  1.9  PHOS  --  2.8   GFR: CrCl cannot be calculated (Unknown ideal weight.). Liver Function Tests:  Recent Labs Lab 06/15/16 2210 06/16/16 0429  AST 25 22  ALT 21 20  ALKPHOS 97 94  BILITOT 0.4 0.3  PROT 8.1 7.9  ALBUMIN 3.9 3.6   No results for input(s): LIPASE, AMYLASE in the last 168 hours. No results for input(s): AMMONIA in the last 168 hours.  Coagulation Profile: No results for input(s): INR, PROTIME in the last 168 hours. Cardiac Enzymes: No results for input(s): CKTOTAL, CKMB, CKMBINDEX, TROPONINI in the last 168 hours. BNP (last 3 results) No results for input(s): PROBNP in the last 8760 hours. HbA1C:  Recent Labs  06/16/16 0429  HGBA1C 7.8*   CBG:  Recent Labs Lab 06/17/16 1219 06/17/16 1638 06/17/16 2042 06/18/16 0141 06/18/16 0520  GLUCAP 175* 190* 130* 124* 134*   Lipid Profile: No results for input(s): CHOL, HDL, LDLCALC, TRIG, CHOLHDL, LDLDIRECT in the last 72 hours. Thyroid Function Tests:  Recent Labs  06/16/16 0429  TSH 1.364   Anemia Panel: No results for input(s): VITAMINB12, FOLATE, FERRITIN, TIBC, IRON, RETICCTPCT in the last 72 hours. Sepsis Labs: No results for input(s):  PROCALCITON, LATICACIDVEN in the last 168 hours.  Recent Results (from the past 240 hour(s))  Urine culture     Status: Abnormal   Collection Time: 06/15/16 11:34 PM  Result Value Ref Range Status   Specimen Description URINE, CLEAN CATCH  Final   Special Requests NONE  Final   Culture 70,000 COLONIES/mL ENTEROCOCCUS FAECALIS (A)  Final   Report Status 06/18/2016 FINAL  Final   Organism ID, Bacteria ENTEROCOCCUS FAECALIS (A)  Final      Susceptibility   Enterococcus faecalis - MIC*    AMPICILLIN <=2 SENSITIVE Sensitive     LEVOFLOXACIN 0.5 SENSITIVE Sensitive     NITROFURANTOIN <=16 SENSITIVE Sensitive     VANCOMYCIN 1 SENSITIVE Sensitive     * 70,000 COLONIES/mL ENTEROCOCCUS FAECALIS      Radiology Studies: No results found.  Scheduled Meds: . aspirin EC  81 mg Oral Daily  . enoxaparin (LOVENOX) injection  30 mg Subcutaneous Q24H  . thiamine  100 mg Oral Daily   Continuous Infusions: . sodium chloride Stopped (06/18/16 1330)     LOS: 1 day    Time spent: > 35 minutes Velvet Bathe, MD [facility] Pager [phone]  If 7PM-7AM, please contact night-coverage [email or web address] Password [email or web address] 06/18/2016, 3:52 PM

## 2016-06-18 NOTE — Progress Notes (Signed)
Inpatient Diabetes Program Recommendations  AACE/ADA: New Consensus Statement on Inpatient Glycemic Control (2015)  Target Ranges:  Prepandial:   less than 140 mg/dL      Peak postprandial:   less than 180 mg/dL (1-2 hours)      Critically ill patients:  140 - 180 mg/dL   Lab Results  Component Value Date   GLUCAP 134 (H) 06/18/2016   HGBA1C 7.8 (H) 06/16/2016    Review of Glycemic Control  Spoke with pt briefly about his diabetes control. Daughter not present. States his blood sugars usually are below 200. States he takes his 70/30 insulin twice a day. Checks blood sugars regularly. Blood sugars trending well. No insulin ordered. Eating well until this morning - only 25%. Nothing at lunch. Will try to f/u with daughter.   Continue to follow. Thank you. Lorenda Peck, RD, LDN, CDE Inpatient Diabetes Coordinator [phone]

## 2016-06-19 DIAGNOSIS — I951 Orthostatic hypotension: Secondary | ICD-10-CM

## 2016-06-19 DIAGNOSIS — R55 Syncope and collapse: Secondary | ICD-10-CM

## 2016-06-19 DIAGNOSIS — N183 Chronic kidney disease, stage 3 (moderate): Secondary | ICD-10-CM

## 2016-06-19 LAB — BASIC METABOLIC PANEL
ANION GAP: 5 (ref 5–15)
BUN: 19 mg/dL (ref 6–20)
CALCIUM: 9.3 mg/dL (ref 8.9–10.3)
CO2: 28 mmol/L (ref 22–32)
Chloride: 106 mmol/L (ref 101–111)
Creatinine, Ser: 1.24 mg/dL (ref 0.61–1.24)
GFR, EST NON AFRICAN AMERICAN: 59 mL/min — AB (ref >60)
GLUCOSE: 172 mg/dL — AB (ref 65–99)
POTASSIUM: 4.3 mmol/L (ref 3.5–5.1)
Sodium: 139 mmol/L (ref 135–145)

## 2016-06-19 LAB — CBC WITH DIFFERENTIAL/PLATELET
Basophils Absolute: 0 10*3/uL (ref 0.0–0.1)
Basophils Relative: 0 %
Eosinophils Absolute: 0.2 10*3/uL (ref 0.0–0.7)
Eosinophils Relative: 4 %
HCT: 34.8 % — ABNORMAL LOW (ref 39.0–52.0)
Hemoglobin: 11.7 g/dL — ABNORMAL LOW (ref 13.0–17.0)
Lymphocytes Relative: 42 %
Lymphs Abs: 2.6 10*3/uL (ref 0.7–4.0)
MCH: 30.2 pg (ref 26.0–34.0)
MCHC: 33.6 g/dL (ref 30.0–36.0)
MCV: 89.7 fL (ref 78.0–100.0)
Monocytes Absolute: 0.5 10*3/uL (ref 0.1–1.0)
Monocytes Relative: 7 %
Neutro Abs: 2.9 10*3/uL (ref 1.7–7.7)
Neutrophils Relative %: 47 %
Platelets: 214 10*3/uL (ref 150–400)
RBC: 3.88 MIL/uL — ABNORMAL LOW (ref 4.22–5.81)
RDW: 13.3 % (ref 11.5–15.5)
WBC: 6.2 10*3/uL (ref 4.0–10.5)

## 2016-06-19 LAB — SEDIMENTATION RATE: Sed Rate: 68 mm/hr — ABNORMAL HIGH (ref 0–16)

## 2016-06-19 LAB — GLUCOSE, CAPILLARY
Glucose-Capillary: 124 mg/dL — ABNORMAL HIGH (ref 65–99)
Glucose-Capillary: 165 mg/dL — ABNORMAL HIGH (ref 65–99)

## 2016-06-19 LAB — C-REACTIVE PROTEIN: CRP: 0.7 mg/dL (ref <1.0)

## 2016-06-19 MED ORDER — PNEUMOCOCCAL VAC POLYVALENT 25 MCG/0.5ML IJ INJ
0.5000 mL | INJECTION | INTRAMUSCULAR | Status: DC
  Filled 2016-06-19 (×2): qty 0.5

## 2016-06-19 MED ORDER — SODIUM CHLORIDE 0.9 % IV SOLN
INTRAVENOUS | Status: DC
  Administered 2016-06-19 – 2016-06-20 (×2): via INTRAVENOUS

## 2016-06-19 MED ORDER — ENOXAPARIN SODIUM 40 MG/0.4ML ~~LOC~~ SOLN
40.0000 mg | SUBCUTANEOUS | Status: DC
  Administered 2016-06-20: 40 mg via SUBCUTANEOUS
  Filled 2016-06-19: qty 0.4

## 2016-06-19 NOTE — Progress Notes (Signed)
PROGRESS NOTE    Brian Owen  [id number] DOB: [birth year] DOA: 06/15/2016 PCP: No primary care provider on file.   Brief Narrative: 65 year old who presented lethargic with hypoglycemia. He reported poor oral intake and was admitted for further evaluation recommendations. Hospital stay has been complicated by hypoglycemia and orthostatic hypotension   Assessment & Plan:   Active Problems:   Hypoglycemia - resolved    CKD (chronic kidney disease) - Stable currently. Serum creatinine trending down    DM type 2 (diabetes mellitus, type 2) ([place]) - Patient was hypoglycemic recently. - We'll plan on controlling with diet modification    Orthostatic hypotension - continues to be an ongoing problem despite IVF rehydration - obtain cortisol levels - consulted cardiology for further evaluation and recommendations. - placed order for ted hose placement.   DVT prophylaxis: ted hose Code Status: Full Family Communication: d/c patient directly Disposition Plan: DC with resolution of orthostatic hypotension   Consultants:   None   Procedures: None   Antimicrobials: None   Subjective: Pt is frustrated he states he would like to go home.    Objective: Vitals:   06/19/16 0530 06/19/16 0949 06/19/16 1109 06/19/16 1510  BP: 123/77  (!) 187/103 (!) 143/80  Pulse: 83  98 78  Resp: 16 16    Temp: 98 F (36.7 C) 98.6 F (37 C)    TempSrc: Oral Oral    SpO2: 98% 100% 100%   Weight:   51.7 kg (114 lb)   Height:   5\' 2"  (1.575 m)     Intake/Output Summary (Last 24 hours) at 06/19/16 1547 Last data filed at 06/19/16 1100  Gross per 24 hour  Intake          2548.33 ml  Output             2000 ml  Net           548.33 ml   Filed Weights   06/16/16 0259 06/19/16 1109  Weight: 52.4 kg (115 lb 8.3 oz) 51.7 kg (114 lb)    Examination:  General exam: Appears calm and comfortable, in nad. Respiratory system: Clear to auscultation. Respiratory effort  normal. Cardiovascular system: S1 & S2 heard, RRR. No JVD, murmurs, rubs, gallops or clicks. No pedal edema. Gastrointestinal system: Abdomen is nondistended, soft and nontender. No organomegaly or masses felt. Normal bowel sounds heard. Central nervous system: Alert and oriented. No focal neurological deficits. Extremities: Symmetric 5 x 5 power. Skin: No rashes, lesions or ulcers Psychiatry:  Mood & affect appropriate.   Data Reviewed: I have personally reviewed following labs and imaging studies  CBC:  Recent Labs Lab 06/15/16 2210 06/16/16 0429  WBC 7.7 6.0  NEUTROABS 5.0  --   HGB 11.5* 11.7*  HCT 34.9* 34.7*  MCV 90.4 88.7  PLT 190 99991111   Basic Metabolic Panel:  Recent Labs Lab 06/15/16 2210 06/16/16 0429  NA 139 137  K 3.5 3.9  CL 109 105  CO2 25 26  GLUCOSE 84 214*  BUN 16 15  CREATININE 1.39* 1.06  CALCIUM 9.2 8.9  MG  --  1.9  PHOS  --  2.8   GFR: Estimated Creatinine Clearance: 50.8 mL/min (by C-G formula based on SCr of 1.06 mg/dL). Liver Function Tests:  Recent Labs Lab 06/15/16 2210 06/16/16 0429  AST 25 22  ALT 21 20  ALKPHOS 97 94  BILITOT 0.4 0.3  PROT 8.1 7.9  ALBUMIN 3.9 3.6   No results for input(s):  LIPASE, AMYLASE in the last 168 hours. No results for input(s): AMMONIA in the last 168 hours. Coagulation Profile: No results for input(s): INR, PROTIME in the last 168 hours. Cardiac Enzymes: No results for input(s): CKTOTAL, CKMB, CKMBINDEX, TROPONINI in the last 168 hours. BNP (last 3 results) No results for input(s): PROBNP in the last 8760 hours. HbA1C: No results for input(s): HGBA1C in the last 72 hours. CBG:  Recent Labs Lab 06/17/16 2042 06/18/16 0141 06/18/16 0520 06/18/16 1920 06/19/16 0750  GLUCAP 130* 124* 134* 151* 124*   Lipid Profile: No results for input(s): CHOL, HDL, LDLCALC, TRIG, CHOLHDL, LDLDIRECT in the last 72 hours. Thyroid Function Tests: No results for input(s): TSH, T4TOTAL, FREET4, T3FREE,  THYROIDAB in the last 72 hours. Anemia Panel: No results for input(s): VITAMINB12, FOLATE, FERRITIN, TIBC, IRON, RETICCTPCT in the last 72 hours. Sepsis Labs: No results for input(s): PROCALCITON, LATICACIDVEN in the last 168 hours.  Recent Results (from the past 240 hour(s))  Urine culture     Status: Abnormal   Collection Time: 06/15/16 11:34 PM  Result Value Ref Range Status   Specimen Description URINE, CLEAN CATCH  Final   Special Requests NONE  Final   Culture 70,000 COLONIES/mL ENTEROCOCCUS FAECALIS (A)  Final   Report Status 06/18/2016 FINAL  Final   Organism ID, Bacteria ENTEROCOCCUS FAECALIS (A)  Final      Susceptibility   Enterococcus faecalis - MIC*    AMPICILLIN <=2 SENSITIVE Sensitive     LEVOFLOXACIN 0.5 SENSITIVE Sensitive     NITROFURANTOIN <=16 SENSITIVE Sensitive     VANCOMYCIN 1 SENSITIVE Sensitive     * 70,000 COLONIES/mL ENTEROCOCCUS FAECALIS      Radiology Studies: No results found.  Scheduled Meds: . aspirin EC  81 mg Oral Daily  . enoxaparin (LOVENOX) injection  30 mg Subcutaneous Q24H  . [START ON 06/20/2016] pneumococcal 23 valent vaccine  0.5 mL Intramuscular Tomorrow-1000  . thiamine  100 mg Oral Daily   Continuous Infusions:     LOS: 2 days    Time spent: > 35 minutes Velvet Bathe, MD [facility] Pager [phone]  If 7PM-7AM, please contact night-coverage [email or web address] Password [facility] 06/19/2016, 3:47 PM

## 2016-06-19 NOTE — Consult Note (Signed)
CARDIOLOGY CONSULT NOTE   Patient ID: Brian Owen MRN: [id number], DOB/AGE: 65-Jan-1952   Admit date: 06/15/2016 Date of Consult: 06/19/2016  Primary Physician: No primary care provider on file. Primary Cardiologist: new patient   Reason for consult:  Orthostatic hypotension  Problem List  Past Medical History:  Diagnosis Date  . Diabetes mellitus without complication ([place])     History reviewed. No pertinent surgical history.   Allergies  No Known Allergies  HPI   65 year old with h/o DM, decubitus ulcer, tobacco abuse, who presented lethargic with hypoglycemia. He reported That starting last Friday he felt warm, profoundly tired with no appetite, poor oral intake and was admitted for further evaluation recommendations. Hospital stay has been complicated by hypoglycemia and orthostatic hypotension, despite IV hydration. The patient states that at home he felt warm but denies fever denies cough or dysuria.  He continues feeling tired and is orthostatic when he stands up, his orthostatic vitals right now were: Laying down blood pressure 143/80, heart rate 78 Sitting up blood pressure 101/72, heart rate 84 Standing up blood pressure 60/38, heart rate 94.  Inpatient Medications  . aspirin EC  81 mg Oral Daily  . enoxaparin (LOVENOX) injection  30 mg Subcutaneous Q24H  . [START ON 06/20/2016] pneumococcal 23 valent vaccine  0.5 mL Intramuscular Tomorrow-1000  . thiamine  100 mg Oral Daily   Family History Family History  Problem Relation Age of Onset  . Diabetes Other     Social History Social History   Social History  . Marital status: Divorced    Spouse name: N/A  . Number of children: N/A  . Years of education: N/A   Occupational History  . Not on file.   Social History Main Topics  . Smoking status: Former Research scientist (life sciences)  . Smokeless tobacco: Never Used  . Alcohol use No  . Drug use: No  . Sexual activity: Not on file   Other Topics Concern  . Not on file    Social History Narrative  . No narrative on file    Review of Systems  General:  No chills, fever, night sweats or weight changes.  Cardiovascular:  No chest pain, dyspnea on exertion, edema, orthopnea, palpitations, paroxysmal nocturnal dyspnea. Dermatological: No rash, lesions/masses Respiratory: No cough, dyspnea Urologic: No hematuria, dysuria Abdominal:   No nausea, vomiting, diarrhea, bright red blood per rectum, melena, or hematemesis Neurologic:  No visual changes, wkns, changes in mental status.  All other systems reviewed and are otherwise negative except as noted above.  Physical Exam  Blood pressure (!) 143/80, pulse 78, temperature 98.6 F (37 C), temperature source Oral, resp. rate 16, height 5\' 2"  (1.575 m), weight 114 lb (51.7 kg), SpO2 100 %.  General: Pleasant, NAD Psych: Normal affect. Neuro: Alert and oriented X 3. Moves all extremities spontaneously. HEENT: Normal  Neck: Supple without bruits or JVD. Lungs:  Resp regular and unlabored, CTA. Heart: RRR no s3, s4, or murmurs. Abdomen: Soft, non-tender, non-distended, BS + x 4.  Extremities: No clubbing, cyanosis or edema. DP/PT/Radials 2+ and equal bilaterally.  Labs  No results for input(s): CKTOTAL, CKMB, TROPONINI in the last 72 hours. Lab Results  Component Value Date   WBC 6.0 06/16/2016   HGB 11.7 (L) 06/16/2016   HCT 34.7 (L) 06/16/2016   MCV 88.7 06/16/2016   PLT 190 06/16/2016    Recent Labs Lab 06/16/16 0429  NA 137  K 3.9  CL 105  CO2 26  BUN 15  CREATININE  1.06  CALCIUM 8.9  PROT 7.9  BILITOT 0.3  ALKPHOS 94  ALT 20  AST 22  GLUCOSE 214*   Radiology/Studies  Dg Chest 2 View  Result Date: 06/15/2016 CLINICAL DATA:  Initial evaluation for acute hypoglycemia. EXAM: CHEST  2 VIEW COMPARISON:  None. FINDINGS: Cardiac and mediastinal silhouettes are within normal limits. Lungs are normally inflated. No focal infiltrate, pulmonary edema, or pleural effusion. No pneumothorax. No  acute osseous abnormality. IMPRESSION: No active cardiopulmonary disease. Electronically Signed   By: Jeannine Boga M.D.   On: 06/15/2016 22:27   Echocardiogram - pending  ECG: SR, normal ECG    ASSESSMENT AND PLAN  65 year old male   1. Orthostatic hypotension - symptomatic, I would continue aggressive hydration, the patient states that he felt warm, there is a positive urine culture, but he denies dysuria, ? Sepsis, I will order CRP, sed rate, CBC, blood culture, agree with checking cortisol. I will start NaCl 100 ml/hr. Order echocardiogram, start telemetry.   We will follow.    Signed, Ena Dawley, MD, [facility] 06/19/2016, 3:56 PM

## 2016-06-19 NOTE — Progress Notes (Signed)
Report called to [place], RN on [place] to transfer 1614 to 1335.  Wylene Simmer, BSN, RN 06/19/2016

## 2016-06-20 ENCOUNTER — Inpatient Hospital Stay (HOSPITAL_COMMUNITY): Payer: Medicare Other

## 2016-06-20 DIAGNOSIS — R55 Syncope and collapse: Secondary | ICD-10-CM

## 2016-06-20 DIAGNOSIS — Z794 Long term (current) use of insulin: Secondary | ICD-10-CM

## 2016-06-20 DIAGNOSIS — E162 Hypoglycemia, unspecified: Secondary | ICD-10-CM

## 2016-06-20 DIAGNOSIS — E1122 Type 2 diabetes mellitus with diabetic chronic kidney disease: Secondary | ICD-10-CM

## 2016-06-20 LAB — ECHOCARDIOGRAM COMPLETE
HEIGHTINCHES: 62 in
WEIGHTICAEL: 1824 [oz_av]

## 2016-06-20 LAB — CORTISOL: CORTISOL PLASMA: 7.8 ug/dL

## 2016-06-20 LAB — CORTISOL-AM, BLOOD: CORTISOL - AM: 6.1 ug/dL — AB (ref 6.7–22.6)

## 2016-06-20 LAB — GLUCOSE, CAPILLARY
GLUCOSE-CAPILLARY: 138 mg/dL — AB (ref 65–99)
Glucose-Capillary: 132 mg/dL — ABNORMAL HIGH (ref 65–99)

## 2016-06-20 MED ORDER — COSYNTROPIN 0.25 MG IJ SOLR: 0.2500 mg | Freq: Once | INTRAMUSCULAR | Status: DC

## 2016-06-20 MED ORDER — AMOXICILLIN-POT CLAVULANATE 875-125 MG PO TABS
1.0000 | ORAL_TABLET | Freq: Two times a day (BID) | ORAL | Status: DC
  Administered 2016-06-20: 1 via ORAL
  Filled 2016-06-20: qty 1

## 2016-06-20 MED ORDER — AMOXICILLIN-POT CLAVULANATE 875-125 MG PO TABS: 1.0000 | ORAL_TABLET | Freq: Two times a day (BID) | ORAL | 0 refills | Status: DC

## 2016-06-20 MED ORDER — THIAMINE HCL 100 MG PO TABS: 100.0000 mg | ORAL_TABLET | Freq: Every day | ORAL | 0 refills | Status: DC

## 2016-06-20 MED ORDER — ASPIRIN 81 MG PO TBEC: 81.0000 mg | DELAYED_RELEASE_TABLET | Freq: Every day | ORAL | Status: DC

## 2016-06-20 NOTE — Progress Notes (Addendum)
Patient does not want to remain in the hospital any longer.  Says he is going home.  Orthos still positive but patient is asymptomatic-- spoke at length with both patient and family and he wants to be discharged. Spoke with cards--- no cardiac follow up needed- echo ok Eulogio Bear DO

## 2016-06-20 NOTE — Progress Notes (Signed)
Patient given discharge instructions.  Patient given TED Hose and educated.  Patient verbalized understanding.

## 2016-06-20 NOTE — Discharge Summary (Signed)
Physician Discharge Summary  Brian Owen [id number] DOB: May 21, 1951 DOA: 06/15/2016  PCP: No primary care provider on file.  Admit date: 06/15/2016 Discharge date: 06/20/2016   Recommendations for Outpatient Follow-Up:   1. TED hose 2. Outpatient endocrinology 3.    Discharge Diagnosis:   Active Problems:   Hypoglycemia   CKD (chronic kidney disease)   DM type 2 (diabetes mellitus, type 2) (HCC)   Orthostatic hypotension   Discharge disposition:  Home.   Discharge Condition: Improved.  Diet recommendation: Carbohydrate-modified  Wound care: None.   History of Present Illness:   Presented with low blood sugar from home tonight he was very lethargic all day he have run low blood sugars. Nonetheless his daughter still give him insulin. EMS was called his blood sugar was in the 30s. He was given a sandwich and D50 glucose CABG improved to 74 but on arrival to [facility] department was down into the 60s. Patient is not speak [place]. He reports feeling tired all day long have had decreased by mouth intake is not aware of any changes in his medications. Apparently his daughter is unsure about what the name of medications aren't but states that he's been taking only one medicine twice a day. She denies any chest sprain no recent falls   Regarding pertinent Chronic problems: He has known history of diabetes he have had prior admissions for hypoglycemia in the past usually associated with UTI   Hospital Course by Problem:   Hypoglycemia - resolved    CKD (chronic kidney disease) - Stable currently. Serum creatinine trending down    DM type 2 (diabetes mellitus, type 2) ([place]) - d/c insulin Diet control only due to hypoglycemia    Orthostatic hypotension - cortisol slightly low - patient says he is not symptomatic and is refusing to stay in hospital for further work up Echo normal- no cardiology follow up Instructions given for how to get up  Enterococcus UTI -unable  to tell if patient is having symptoms -treat with augmentin   Medical Consultants:    cards   Discharge Exam:   Vitals:   06/20/16 0425 06/20/16 1026  BP: 126/79   Pulse: 92 100  Resp: 18   Temp: 98.4 F (36.9 C)    Vitals:   06/19/16 1748 06/19/16 2101 06/20/16 0425 06/20/16 1026  BP: (!) 171/92 130/86 126/79   Pulse: 90 (!) 106 92 100  Resp: 18 18 18    Temp: 98.7 F (37.1 C) 98.9 F (37.2 C) 98.4 F (36.9 C)   TempSrc: Oral Oral Oral Oral  SpO2: 100% 98% 100% 99%  Weight:      Height:        Gen:  NAD    The results of significant diagnostics from this hospitalization (including imaging, microbiology, ancillary and laboratory) are listed below for reference.     Procedures and Diagnostic Studies:   Dg Chest 2 View  Result Date: 06/15/2016 CLINICAL DATA:  Initial evaluation for acute hypoglycemia. EXAM: CHEST  2 VIEW COMPARISON:  None. FINDINGS: Cardiac and mediastinal silhouettes are within normal limits. Lungs are normally inflated. No focal infiltrate, pulmonary edema, or pleural effusion. No pneumothorax. No acute osseous abnormality. IMPRESSION: No active cardiopulmonary disease. Electronically Signed   By: Jeannine Boga M.D.   On: 06/15/2016 22:27     Labs:   Basic Metabolic Panel:  Recent Labs Lab 06/15/16 2210 06/16/16 0429 06/19/16 1620  NA 139 137 139  K 3.5 3.9 4.3  CL 109 105  106  CO2 25 26 28   GLUCOSE 84 214* 172*  BUN 16 15 19   CREATININE 1.39* 1.06 1.24  CALCIUM 9.2 8.9 9.3  MG  --  1.9  --   PHOS  --  2.8  --    GFR Estimated Creatinine Clearance: 43.4 mL/min (by C-G formula based on SCr of 1.24 mg/dL). Liver Function Tests:  Recent Labs Lab 06/15/16 2210 06/16/16 0429  AST 25 22  ALT 21 20  ALKPHOS 97 94  BILITOT 0.4 0.3  PROT 8.1 7.9  ALBUMIN 3.9 3.6   No results for input(s): LIPASE, AMYLASE in the last 168 hours. No results for input(s): AMMONIA in the last 168 hours. Coagulation profile No results for  input(s): INR, PROTIME in the last 168 hours.  CBC:  Recent Labs Lab 06/15/16 2210 06/16/16 0429 06/19/16 1620  WBC 7.7 6.0 6.2  NEUTROABS 5.0  --  2.9  HGB 11.5* 11.7* 11.7*  HCT 34.9* 34.7* 34.8*  MCV 90.4 88.7 89.7  PLT 190 190 214   Cardiac Enzymes: No results for input(s): CKTOTAL, CKMB, CKMBINDEX, TROPONINI in the last 168 hours. BNP: Invalid input(s): POCBNP CBG:  Recent Labs Lab 06/18/16 1920 06/19/16 0750 06/19/16 2106 06/20/16 0754 06/20/16 1147  GLUCAP 151* 124* 165* 132* 138*   D-Dimer No results for input(s): DDIMER in the last 72 hours. Hgb A1c No results for input(s): HGBA1C in the last 72 hours. Lipid Profile No results for input(s): CHOL, HDL, LDLCALC, TRIG, CHOLHDL, LDLDIRECT in the last 72 hours. Thyroid function studies No results for input(s): TSH, T4TOTAL, T3FREE, THYROIDAB in the last 72 hours.  Invalid input(s): FREET3 Anemia work up No results for input(s): VITAMINB12, FOLATE, FERRITIN, TIBC, IRON, RETICCTPCT in the last 72 hours. Microbiology Recent Results (from the past 240 hour(s))  Urine culture     Status: Abnormal   Collection Time: 06/15/16 11:34 PM  Result Value Ref Range Status   Specimen Description URINE, CLEAN CATCH  Final   Special Requests NONE  Final   Culture 70,000 COLONIES/mL ENTEROCOCCUS FAECALIS (A)  Final   Report Status 06/18/2016 FINAL  Final   Organism ID, Bacteria ENTEROCOCCUS FAECALIS (A)  Final      Susceptibility   Enterococcus faecalis - MIC*    AMPICILLIN <=2 SENSITIVE Sensitive     LEVOFLOXACIN 0.5 SENSITIVE Sensitive     NITROFURANTOIN <=16 SENSITIVE Sensitive     VANCOMYCIN 1 SENSITIVE Sensitive     * 70,000 COLONIES/mL ENTEROCOCCUS FAECALIS  Culture, blood (single)     Status: None (Preliminary result)   Collection Time: 06/19/16  4:20 PM  Result Value Ref Range Status   Specimen Description BLOOD RIGHT ARM  Final   Special Requests BOTTLES DRAWN AEROBIC AND ANAEROBIC 5CC  Final   Culture    Final    NO GROWTH < 24 HOURS Performed at [facility]    Report Status PENDING  Incomplete     Discharge Instructions:   Discharge Instructions    Diet Carb Modified    Complete by:  As directed   Discharge instructions    Complete by:  As directed   24 hour supervision by family   Discharge instructions    Complete by:  As directed   TED hose   Increase activity slowly    Complete by:  As directed       Medication List    STOP taking these medications   insulin aspart protamine- aspart (70-30) 100 UNIT/ML injection Commonly known  as:  NOVOLOG MIX 70/30     TAKE these medications   amoxicillin-clavulanate 875-125 MG tablet Commonly known as:  AUGMENTIN Take 1 tablet by mouth every 12 (twelve) hours. Notes to patient:  A common side effect of this medication is that it causes diarrhea, upset stomach, or nausea.  You can take it with food if this happens.    aspirin 81 MG EC tablet Take 1 tablet (81 mg total) by mouth daily.   thiamine 100 MG tablet Take 1 tablet (100 mg total) by mouth daily.      Follow-up Information    [name], MD .   Specialty:  Family Medicine Why:  for hospital follow up and endocrine referral-- also GI if not done already Contact information: [place] [id number] [phone]            Time coordinating discharge: 35 min  Signed:  [facility] 06/20/2016, 3:58 PM

## 2016-06-20 NOTE — Progress Notes (Signed)
  Echocardiogram 2D Echocardiogram has been performed.  Jennette Dubin 06/20/2016, 9:22 AM

## 2016-06-22 ENCOUNTER — Encounter: Payer: Self-pay | Admitting: Internal Medicine

## 2016-06-24 LAB — CULTURE, BLOOD (SINGLE): Culture: NO GROWTH

## 2016-07-13 ENCOUNTER — Telehealth: Payer: Self-pay | Admitting: Family Medicine

## 2016-07-13 MED ORDER — "INSULIN SYRINGE-NEEDLE U-100 25G X 1"" 1 ML MISC": 0 refills | Status: DC

## 2016-07-13 NOTE — Telephone Encounter (Signed)
Pt calling requesting a refill of his needles for his insulin Uses the [facility] on [facility]

## 2016-07-13 NOTE — Telephone Encounter (Signed)
Sent in syringes but patient must have an office visit for further refills.

## 2016-08-27 ENCOUNTER — Encounter (HOSPITAL_COMMUNITY): Payer: Self-pay | Admitting: Emergency Medicine

## 2016-08-27 ENCOUNTER — Inpatient Hospital Stay (HOSPITAL_COMMUNITY)
Admission: EM | Admit: 2016-08-27 | Discharge: 2016-08-29 | DRG: 074 | Disposition: A | Discharge status: Home / Self Care | Payer: Medicare Other | Attending: Internal Medicine | Admitting: Internal Medicine

## 2016-08-27 ENCOUNTER — Emergency Department (HOSPITAL_COMMUNITY): Payer: Medicare Other

## 2016-08-27 DIAGNOSIS — Z79899 Other long term (current) drug therapy: Secondary | ICD-10-CM

## 2016-08-27 DIAGNOSIS — Z794 Long term (current) use of insulin: Secondary | ICD-10-CM | POA: Diagnosis present

## 2016-08-27 DIAGNOSIS — E1165 Type 2 diabetes mellitus with hyperglycemia: Secondary | ICD-10-CM | POA: Diagnosis present

## 2016-08-27 DIAGNOSIS — R55 Syncope and collapse: Secondary | ICD-10-CM | POA: Diagnosis not present

## 2016-08-27 DIAGNOSIS — R636 Underweight: Secondary | ICD-10-CM | POA: Diagnosis present

## 2016-08-27 DIAGNOSIS — I129 Hypertensive chronic kidney disease with stage 1 through stage 4 chronic kidney disease, or unspecified chronic kidney disease: Secondary | ICD-10-CM | POA: Diagnosis present

## 2016-08-27 DIAGNOSIS — E1143 Type 2 diabetes mellitus with diabetic autonomic (poly)neuropathy: Secondary | ICD-10-CM | POA: Diagnosis not present

## 2016-08-27 DIAGNOSIS — E860 Dehydration: Secondary | ICD-10-CM | POA: Diagnosis not present

## 2016-08-27 DIAGNOSIS — Z681 Body mass index (BMI) 19 or less, adult: Secondary | ICD-10-CM

## 2016-08-27 DIAGNOSIS — E1343 Other specified diabetes mellitus with diabetic autonomic (poly)neuropathy: Secondary | ICD-10-CM | POA: Diagnosis present

## 2016-08-27 DIAGNOSIS — Z7982 Long term (current) use of aspirin: Secondary | ICD-10-CM

## 2016-08-27 DIAGNOSIS — N183 Chronic kidney disease, stage 3 (moderate): Secondary | ICD-10-CM | POA: Diagnosis not present

## 2016-08-27 DIAGNOSIS — E1122 Type 2 diabetes mellitus with diabetic chronic kidney disease: Secondary | ICD-10-CM | POA: Diagnosis not present

## 2016-08-27 DIAGNOSIS — Z87891 Personal history of nicotine dependence: Secondary | ICD-10-CM | POA: Diagnosis not present

## 2016-08-27 DIAGNOSIS — N390 Urinary tract infection, site not specified: Secondary | ICD-10-CM

## 2016-08-27 DIAGNOSIS — J189 Pneumonia, unspecified organism: Secondary | ICD-10-CM | POA: Diagnosis present

## 2016-08-27 DIAGNOSIS — B952 Enterococcus as the cause of diseases classified elsewhere: Secondary | ICD-10-CM | POA: Diagnosis present

## 2016-08-27 DIAGNOSIS — I951 Orthostatic hypotension: Secondary | ICD-10-CM | POA: Diagnosis present

## 2016-08-27 DIAGNOSIS — R81 Glycosuria: Secondary | ICD-10-CM | POA: Diagnosis present

## 2016-08-27 DIAGNOSIS — R531 Weakness: Secondary | ICD-10-CM | POA: Diagnosis not present

## 2016-08-27 HISTORY — DX: Unspecified parasitic disease: B89

## 2016-08-27 HISTORY — DX: Chronic kidney disease, unspecified: N18.9

## 2016-08-27 LAB — CBC WITH DIFFERENTIAL/PLATELET
BASOS ABS: 0 10*3/uL (ref 0.0–0.1)
BASOS PCT: 0 %
EOS ABS: 0.1 10*3/uL (ref 0.0–0.7)
EOS PCT: 1 %
HCT: 34.6 % — ABNORMAL LOW (ref 39.0–52.0)
Hemoglobin: 11.8 g/dL — ABNORMAL LOW (ref 13.0–17.0)
Lymphocytes Relative: 8 %
Lymphs Abs: 1 10*3/uL (ref 0.7–4.0)
MCH: 29.7 pg (ref 26.0–34.0)
MCHC: 34.1 g/dL (ref 30.0–36.0)
MCV: 87.2 fL (ref 78.0–100.0)
MONO ABS: 0.5 10*3/uL (ref 0.1–1.0)
Monocytes Relative: 4 %
Neutro Abs: 10.6 10*3/uL — ABNORMAL HIGH (ref 1.7–7.7)
Neutrophils Relative %: 87 %
PLATELETS: 158 10*3/uL (ref 150–400)
RBC: 3.97 MIL/uL — ABNORMAL LOW (ref 4.22–5.81)
RDW: 12.3 % (ref 11.5–15.5)
WBC: 12.2 10*3/uL — ABNORMAL HIGH (ref 4.0–10.5)

## 2016-08-27 LAB — URINALYSIS, ROUTINE W REFLEX MICROSCOPIC
Bilirubin Urine: NEGATIVE
KETONES UR: NEGATIVE mg/dL
NITRITE: NEGATIVE
PROTEIN: NEGATIVE mg/dL
Specific Gravity, Urine: 1.013 (ref 1.005–1.030)
pH: 6 (ref 5.0–8.0)

## 2016-08-27 LAB — COMPREHENSIVE METABOLIC PANEL
ALT: 20 U/L (ref 17–63)
AST: 22 U/L (ref 15–41)
Albumin: 3.6 g/dL (ref 3.5–5.0)
Alkaline Phosphatase: 107 U/L (ref 38–126)
Anion gap: 9 (ref 5–15)
BILIRUBIN TOTAL: 0.8 mg/dL (ref 0.3–1.2)
BUN: 12 mg/dL (ref 6–20)
CALCIUM: 9.3 mg/dL (ref 8.9–10.3)
CO2: 25 mmol/L (ref 22–32)
CREATININE: 1.44 mg/dL — AB (ref 0.61–1.24)
Chloride: 101 mmol/L (ref 101–111)
GFR calc Af Amer: 57 mL/min — ABNORMAL LOW (ref >60)
GFR, EST NON AFRICAN AMERICAN: 50 mL/min — AB (ref >60)
Glucose, Bld: 380 mg/dL — ABNORMAL HIGH (ref 65–99)
POTASSIUM: 4.1 mmol/L (ref 3.5–5.1)
Sodium: 135 mmol/L (ref 135–145)
TOTAL PROTEIN: 7.7 g/dL (ref 6.5–8.1)

## 2016-08-27 LAB — URINE MICROSCOPIC-ADD ON

## 2016-08-27 LAB — GLUCOSE, CAPILLARY
GLUCOSE-CAPILLARY: 302 mg/dL — AB (ref 65–99)
Glucose-Capillary: 136 mg/dL — ABNORMAL HIGH (ref 65–99)

## 2016-08-27 LAB — STREP PNEUMONIAE URINARY ANTIGEN: Strep Pneumo Urinary Antigen: NEGATIVE

## 2016-08-27 LAB — I-STAT CG4 LACTIC ACID, ED: Lactic Acid, Venous: 1.87 mmol/L (ref 0.5–1.9)

## 2016-08-27 LAB — INFLUENZA PANEL BY PCR (TYPE A & B)
H1N1FLUPCR: NOT DETECTED
Influenza A By PCR: NEGATIVE
Influenza B By PCR: NEGATIVE

## 2016-08-27 LAB — MAGNESIUM: MAGNESIUM: 1.6 mg/dL — AB (ref 1.7–2.4)

## 2016-08-27 LAB — TROPONIN I: Troponin I: <0.03 ng/mL (ref <0.03)

## 2016-08-27 MED ORDER — MAGNESIUM SULFATE 2 GM/50ML IV SOLN
2.0000 g | Freq: Once | INTRAVENOUS | Status: AC
  Administered 2016-08-27: 2 g via INTRAVENOUS
  Filled 2016-08-27: qty 50

## 2016-08-27 MED ORDER — POLYETHYLENE GLYCOL 3350 17 G PO PACK: 17.0000 g | PACK | Freq: Every day | ORAL | Status: DC | PRN

## 2016-08-27 MED ORDER — VANCOMYCIN HCL IN DEXTROSE 1-5 GM/200ML-% IV SOLN
1000.0000 mg | Freq: Once | INTRAVENOUS | Status: AC
  Administered 2016-08-27: 1000 mg via INTRAVENOUS
  Filled 2016-08-27: qty 200

## 2016-08-27 MED ORDER — DEXTROSE 5 % IV SOLN
1.0000 g | INTRAVENOUS | Status: DC
  Administered 2016-08-28: 1 g via INTRAVENOUS
  Filled 2016-08-27: qty 10

## 2016-08-27 MED ORDER — VANCOMYCIN HCL IN DEXTROSE 750-5 MG/150ML-% IV SOLN: 750.0000 mg | INTRAVENOUS | Status: DC

## 2016-08-27 MED ORDER — ENOXAPARIN SODIUM 40 MG/0.4ML ~~LOC~~ SOLN
40.0000 mg | SUBCUTANEOUS | Status: DC
  Administered 2016-08-27 – 2016-08-28 (×2): 40 mg via SUBCUTANEOUS
  Filled 2016-08-27 (×2): qty 0.4

## 2016-08-27 MED ORDER — ONDANSETRON HCL 4 MG PO TABS: 4.0000 mg | ORAL_TABLET | Freq: Four times a day (QID) | ORAL | Status: DC | PRN

## 2016-08-27 MED ORDER — SODIUM CHLORIDE 0.9 % IV SOLN
INTRAVENOUS | Status: AC
  Administered 2016-08-27 – 2016-08-28 (×2): via INTRAVENOUS

## 2016-08-27 MED ORDER — ACETAMINOPHEN 325 MG PO TABS
650.0000 mg | ORAL_TABLET | Freq: Once | ORAL | Status: AC
  Administered 2016-08-27: 650 mg via ORAL
  Filled 2016-08-27: qty 2

## 2016-08-27 MED ORDER — AZITHROMYCIN 500 MG PO TABS
500.0000 mg | ORAL_TABLET | ORAL | Status: DC
  Administered 2016-08-27 – 2016-08-28 (×2): 500 mg via ORAL
  Filled 2016-08-27 (×2): qty 1

## 2016-08-27 MED ORDER — SODIUM CHLORIDE 0.9 % IV BOLUS (SEPSIS)
500.0000 mL | Freq: Once | INTRAVENOUS | Status: AC
  Administered 2016-08-27: 500 mL via INTRAVENOUS

## 2016-08-27 MED ORDER — DEXTROSE 5 % IV SOLN
1.0000 g | Freq: Once | INTRAVENOUS | Status: AC
  Administered 2016-08-27: 1 g via INTRAVENOUS
  Filled 2016-08-27: qty 1

## 2016-08-27 MED ORDER — ONDANSETRON HCL 4 MG/2ML IJ SOLN: 4.0000 mg | Freq: Four times a day (QID) | INTRAMUSCULAR | Status: DC | PRN

## 2016-08-27 MED ORDER — INSULIN ASPART 100 UNIT/ML ~~LOC~~ SOLN
0.0000 [IU] | Freq: Three times a day (TID) | SUBCUTANEOUS | Status: DC
  Administered 2016-08-27: 7 [IU] via SUBCUTANEOUS
  Administered 2016-08-28: 2 [IU] via SUBCUTANEOUS
  Administered 2016-08-28: 3 [IU] via SUBCUTANEOUS
  Administered 2016-08-28: 2 [IU] via SUBCUTANEOUS
  Administered 2016-08-29: 5 [IU] via SUBCUTANEOUS
  Administered 2016-08-29: 2 [IU] via SUBCUTANEOUS

## 2016-08-27 MED ORDER — ACETAMINOPHEN 650 MG RE SUPP: 650.0000 mg | Freq: Four times a day (QID) | RECTAL | Status: DC | PRN

## 2016-08-27 MED ORDER — SODIUM CHLORIDE 0.9 % IV BOLUS (SEPSIS)
1000.0000 mL | Freq: Once | INTRAVENOUS | Status: AC
  Administered 2016-08-27: 1000 mL via INTRAVENOUS

## 2016-08-27 MED ORDER — ACETAMINOPHEN 325 MG PO TABS: 650.0000 mg | ORAL_TABLET | Freq: Four times a day (QID) | ORAL | Status: DC | PRN

## 2016-08-27 MED ORDER — INSULIN ASPART 100 UNIT/ML ~~LOC~~ SOLN: 0.0000 [IU] | Freq: Every day | SUBCUTANEOUS | Status: DC

## 2016-08-27 NOTE — Progress Notes (Signed)
Paged Dr. Heber [place] about patient's BP of 87/47. Dr. Heber [place] advised to recheck BP again; if BP still low to page for a bolus.  Will recheck BP and paged MD as needed.

## 2016-08-27 NOTE — H&P (Signed)
Date: 08/27/2016               Patient Name:  Brian Owen MRN: [id number]  DOB: 21-May-1951 Age / Sex: 65 y.o., male   PCP: Boykin Nearing, MD         Medical Service: Internal Medicine Teaching Service         Attending Physician: Dr. Sid Falcon, MD    First Contact: Dr. Reesa Chew Pager: [phone]  Second Contact: Dr. Juleen China Pager: [phone]       After Hours (After 5p/  First Contact Pager: [phone]  weekends / holidays): Second Contact Pager: [phone]   Chief Complaint: Near syncope  History of Present Illness: Mr. Buhl 65 y.o man with past medical history significant for diabetes and chronic kidney disease brought to ED by EMS after experiencing near syncope this morning around 6 AM. Patient states that he woke up this morning around 6, while going to use the restroom, he felt dizzy, his wife supported him at that point and he never fell. He denies any loss of consciousness. He denies any incontinence. He do complained of headaches for last 2 years, denies any recent change in vision, no history of cough, fever, congestion, recent sick contact. He states that he never experiences this type of symptoms before. He denies any chest pain, palpitations, shortness of breath, orthopnea or PND. He denies any dysuria or burning micturition. He denies any recent change in his bowel habits. He denies any recent change in his weight or appetite.  He was found to have positive orthostatic vitals, taken via EMS no record, he was found to have fever of 102 on arrival to ED.  Patient speaks [place] and history was obtained through an interpreter  Meds:  Current Meds  Medication Sig  . acetaminophen (TYLENOL) 500 MG tablet Take 500 mg by mouth every 6 (six) hours as needed for moderate pain or headache.     Allergies: Allergies as of 08/27/2016  . (No Known Allergies)   Past Medical History:  Diagnosis Date  . [place] 11/11/2015   legs/notes 11/11/2015  . Diabetes mellitus without  complication ([place])   . Heavy cigarette smoker   . Uncontrolled type II diabetes mellitus ([place]) dx'd 2013    Family History: No significant family history.  Social History: He lives with his wife and children. Former smoker, quit 2 years ago, used to smoke 1-1/2 pack per day. Denies any alcohol and illicit drug use.  Review of Systems: A complete ROS was negative except as per HPI.  Physical Exam: Blood pressure 104/64, pulse 95, temperature 102 F (38.9 C), temperature source Rectal, resp. rate 18, height _0  (1.676 m), weight 114 lb (51.7 kg), SpO2 98 %.  Vitals:   08/27/16 1215 08/27/16 1230 08/27/16 1245 08/27/16 1525  BP: 122/74 122/72 104/64 (!) 142/77  Pulse: 101 97 95 94  Resp: _1 Temp:    98.4 F (36.9 C)  TempSrc:      SpO2: 97% 98% 98% 100%  Weight:      Height:       General: Vital signs reviewed.  Patient is well-developed and well-nourished, in no acute distress and cooperative with exam.  Head: Normocephalic and atraumatic. Eyes: EOMI, conjunctivae normal, no scleral icterus.  Neck: Supple, trachea midline, normal ROM, no JVD, masses, thyromegaly, or carotid bruit present.  Cardiovascular: RRR, S1 normal, S2 normal, no murmurs, gallops, or rubs. Pulmonary/Chest: Right basilar inspiratory crackles, rest of  the chest was clear. Abdominal: Soft, non-tender, non-distended, BS +, no masses, organomegaly, or guarding present.  Extremities: Has multiple healed scars on all 4 extremities. No lower extremity edema bilaterally,  pulses symmetric and intact bilaterally. No cyanosis or clubbing. Neurological: A&O x3, Strength is normal and symmetric bilaterally, cranial nerve II-XII are grossly intact, no focal motor deficit, sensory intact to light touch bilaterally.   Labs. CBC    Component Value Date/Time   WBC 12.2 (H) 08/27/2016 0930   RBC 3.97 (L) 08/27/2016 0930   HGB 11.8 (L) 08/27/2016 0930   HCT 34.6 (L) 08/27/2016 0930   HCT 31 08/10/2014    PLT 158 08/27/2016 0930   PLT 261 08/10/2014   MCV 87.2 08/27/2016 0930   MCH 29.7 08/27/2016 0930   MCHC 34.1 08/27/2016 0930   RDW 12.3 08/27/2016 0930   LYMPHSABS 1.0 08/27/2016 0930   MONOABS 0.5 08/27/2016 0930   EOSABS 0.1 08/27/2016 0930   BASOSABS 0.0 08/27/2016 0930   CMP Latest Ref Rng & Units 08/27/2016 06/19/2016 06/16/2016  Glucose 65 - 99 mg/dL 380(H) 172(H) 214(H)  BUN 6 - 20 mg/dL _0 Creatinine 0.61 - 1.24 mg/dL 1.44(H) 1.24 1.06  Sodium 135 - 145 mmol/L 135 139 137  Potassium 3.5 - 5.1 mmol/L 4.1 4.3 3.9  Chloride 101 - 111 mmol/L 101 106 105  CO2 22 - 32 mmol/L _1 Calcium 8.9 - 10.3 mg/dL 9.3 9.3 8.9  Total Protein 6.5 - 8.1 g/dL 7.7 - 7.9  Total Bilirubin 0.3 - 1.2 mg/dL 0.8 - 0.3  Alkaline Phos 38 - 126 U/L 107 - 94  AST 15 - 41 U/L 22 - 22  ALT 17 - 63 U/L 20 - 20   Urinalysis    Component Value Date/Time   COLORURINE YELLOW 08/27/2016 1147   APPEARANCEUR CLOUDY (A) 08/27/2016 1147   LABSPEC 1.013 08/27/2016 1147   PHURINE 6.0 08/27/2016 1147   GLUCOSEU >1000 (A) 08/27/2016 1147   HGBUR SMALL (A) 08/27/2016 1147   BILIRUBINUR NEGATIVE 08/27/2016 1147   BILIRUBINUR neg 12/05/2015 0910   KETONESUR NEGATIVE 08/27/2016 1147   PROTEINUR NEGATIVE 08/27/2016 1147   UROBILINOGEN 0.2 12/05/2015 0910   UROBILINOGEN 0.2 07/22/2015 1941   NITRITE NEGATIVE 08/27/2016 1147   LEUKOCYTESUR MODERATE (A) 08/27/2016 1147   Urine microscopic-add on  Order: [id number]  Status:  Final result Visible to patient:  No (Not Released) Next appt:  None    Ref Range & Units 11:47 29moago   Squamous Epithelial / LPF NONE SEEN 0-5   NONE SEEN    WBC, UA 0 - 5 WBC/hpf TOO NUMEROUS TO COUNT  6-30    RBC / HPF 0 - 5 RBC/hpf 0-5  NONE SEEN    Bacteria, UA NONE SEEN RARE   NONE SEEN             Lactic Acid. 1.87  EKG: Sinus tachycardia, with some J-point elevation in V2 and V3.  CXR: FINDINGS: The lungs are adequately inflated. There is increased density  in the right lower lobe. The left lung is clear. The heart and pulmonary vascularity are normal. The mediastinum is normal in width. There is calcification in the wall of the aortic arch. The bony thorax exhibits no acute abnormality.  IMPRESSION: Probable right lower lobe pneumonia. Followup PA and lateral chest X-ray is recommended in 3-4 weeks following trial of antibiotic therapy to ensure resolution and exclude underlying malignancy.  Aortic atherosclerosis.  CT  Head WO Contrast. FINDINGS: Brain: No evidence of acute infarction, hemorrhage, hydrocephalus, extra-axial collection or mass lesion/mass effect. Mild atrophic changes are again seen.  Vascular: No hyperdense vessel or unexpected calcification.  Skull: Normal. Negative for fracture or focal lesion.  Sinuses/Orbits: Mild mucosal thickening in the ethmoid sinuses is noted.  Other: None.  IMPRESSION: Mild atrophic changes.  No acute intracranial abnormality noted.  Mild sinus disease.   Assessment & Plan by Problem:   Mr. Vieau 65 y.o man with past medical history significant for diabetes and chronic kidney disease brought to ED by EMS after experiencing near syncope this morning around 6 AM.  Although he denies any fever, cough, chest pain, urinary complaints. He was found to have fever of 102 on arrival to the ED and leukocytosis of 12.2 with neutrophil predominance. His chest x-ray shows right lower lobe pneumonia.  CAP.  He has documented fever and leukocytosis, along with the right lower lobe consolidation. -Ceftriaxone -Azithromycin  UTI. His urine looks infected, he has no urinary complaints. He was found to have an enterococcus species on 2 previous urine cultures done in April and August of this year, with the same sensitivity. It was sensitive to ampicillin, levofloxacin, nitrofurantoin and vancomycin. He has these recurrent episodes of UTI, need for further evaluation by urology.  Diabetes. He was  on NovoLog Mix 70/30 which was stopped during his admission and August 2017, when he was admitted because of hypoglycemia and UTI. Since then he is not on any medicines for his diabetes. His lab blood sugar was 380 today. -Monitor CBG. -SSI  Orthostatic hypotension. He was recently admitted in hospital in August 2017 because of hypoglycemia and positive orthostatics. He was found to have mildly low a.m. Cortisol, and elevated ESR at 68 during that visit. 2 years ago his ESR was 129. He was treated for enterococcus UTI during that visit with Augmentin.  Code. Full DVT prophylaxis. Lovenox Diet. Diabetic  Dispo: Admit patient to Observation with expected length of stay less than 2 midnights.  Signed: Lorella Nimrod, MD 08/27/2016, 1:52 PM  Pager: [phone]

## 2016-08-27 NOTE — ED Provider Notes (Signed)
[facility] DEPT Provider Note   CSN: [id number] Arrival date & time: 08/27/16  5366     History   Chief Complaint Chief Complaint  Patient presents with  . Weakness  . Code Sepsis    HPI Brian Owen is a 65 y.o. male.  HPI  65 year old male with a history of diabetes, hypertension, and orthostatic hypotension presents with near-syncope. The patient's history is taken through the help of the [place] interpreter. Patient tells me that he started feeling cold and nauseated last night. This morning when he was walking to the bathroom he felt acutely dizzy/lightheaded and had to lay himself down. Could not get up and his wife had to come pick him up. EMS was called. Found to have a fever and was tachycardic. Was orthostatic per EMS. Patient denies cough, chest pain, neck pain, back pain, vomiting, or diarrhea. No abdominal pain. Denies urinary symptoms. His glucose was 300 per EMS. Has had a headache for the past 2 years he would like checked out. It comes and goes with fevers. Denies a current headache.  Past Medical History:  Diagnosis Date  . Bullae 11/11/2015   legs/notes 11/11/2015  . Diabetes mellitus without complication ([place])   . Heavy cigarette smoker   . Uncontrolled type II diabetes mellitus ([place]) dx'd 2013    Patient Active Problem List   Diagnosis Date Noted  . Orthostatic hypotension 06/17/2016  . Hypoglycemia 06/16/2016  . CKD (chronic kidney disease) 06/16/2016  . DM type 2 (diabetes mellitus, type 2) ([place]) 06/16/2016  . Underweight 01/17/2016  . Pruritic rash 12/20/2015  . Stage IV skin ulcer ([place]) 12/05/2015  . Autonomic neuropathy due to diabetes ([place]) 11/29/2015  . C. difficile diarrhea 11/29/2015  . Bullae 11/29/2015  . Hyperglycemia 11/27/2015  . Vertigo 11/27/2015  . Orthostatic hypotension 11/27/2015  . Abnormal thyroid function test 11/15/2015  . Weakness 11/11/2015  . SVT (supraventricular tachycardia) ([place]) 11/11/2015  . Diarrhea 08/17/2014    . Protein-calorie malnutrition, severe ([place]) 07/26/2014  . Dizziness 07/22/2014  . History of tobacco use 06/27/2014  . Edentulism, complete 06/27/2014  . Language barrier to communication 06/27/2014  . HTN (hypertension) 06/27/2014  . Normocytic anemia 05/14/2014  . Diabetes mellitus type 2, uncontrolled ([place]) 05/13/2014    Past Surgical History:  Procedure Laterality Date  . NO PAST SURGERIES         Home Medications    Prior to Admission medications   Medication Sig Start Date End Date Taking? Authorizing Provider  ACCU-CHEK SOFTCLIX LANCETS lancets 1 each by Other route 3 (three) times daily. E11.65 12/05/15   Boykin Nearing, MD  acetaminophen (TYLENOL) 500 MG tablet Take 500 mg by mouth every 6 (six) hours as needed for moderate pain or headache.    Historical Provider, MD  Amino Acids-Protein Hydrolys (FEEDING SUPPLEMENT, PRO-STAT SUGAR FREE 64,) LIQD Take 30 mLs by mouth 3 (three) times daily with meals. Patient not taking: Reported on 02/20/2016 11/13/15   Thurnell Lose, MD  amoxicillin-clavulanate (AUGMENTIN) 875-125 MG tablet Take 1 tablet by mouth every 12 (twelve) hours. 06/20/16   Geradine Girt, DO  aspirin EC 81 MG EC tablet Take 1 tablet (81 mg total) by mouth daily. Patient not taking: Reported on 02/20/2016 11/29/15   Janece Canterbury, MD  aspirin EC 81 MG EC tablet Take 1 tablet (81 mg total) by mouth daily. 06/20/16   Geradine Girt, DO  Blood Glucose Monitoring Suppl (ACCU-CHEK AVIVA PLUS) w/Device KIT 1 Device by Does not apply  route 3 (three) times daily after meals. E11.65 12/05/15   Boykin Nearing, MD  camphor-menthol (SARNA) lotion Apply 1 application topically as needed for itching. 02/21/16   Josalyn [name], MD  cephALEXin (KEFLEX) 500 MG capsule Take 1 capsule (500 mg total) by mouth 2 (two) times daily. 02/21/16   Wenda Overland Little, MD  glucose blood (ACCU-CHEK AVIVA PLUS) test strip 1 each by Other route 3 (three) times daily. E11.65 12/05/15   Boykin Nearing, MD  insulin NPH-regular Human (NOVOLIN 70/30) (70-30) 100 UNIT/ML injection Inject 7 Units into the skin 2 (two) times daily with a meal. 02/21/16   Sharlett Iles, MD  Insulin Syringe-Needle U-100 (B-D INSULIN SYRINGE 1CC/25GX1") 25G X 1" 1 ML MISC Use as directed 07/13/16   Boykin Nearing, MD  thiamine 100 MG tablet Take 1 tablet (100 mg total) by mouth daily. 06/20/16   Geradine Girt, DO    Family History Family History  Problem Relation Age of Onset  . Diabetes Other   . Diabetes      Social History Social History  Substance Use Topics  . Smoking status: Former Smoker    Packs/day: 0.25    Years: 48.00    Types: Cigarettes  . Smokeless tobacco: Never Used  . Alcohol use No     Allergies   Review of patient's allergies indicates no known allergies.   Review of Systems Review of Systems  Constitutional: Positive for chills and fever.  Respiratory: Negative for cough and shortness of breath.   Cardiovascular: Negative for chest pain.  Gastrointestinal: Positive for nausea. Negative for abdominal pain, diarrhea and vomiting.  Genitourinary: Negative for dysuria.  Musculoskeletal: Negative for back pain, neck pain and neck stiffness.  Neurological: Positive for dizziness and headaches. Negative for syncope.  All other systems reviewed and are negative.    Physical Exam Updated Vital Signs BP 107/85   Pulse 117   Temp 102 F (38.9 C) (Rectal)   Ht _0  (1.676 m)   Wt 114 lb (51.7 kg)   BMI 18.40 kg/m   Physical Exam  Constitutional: He is oriented to person, place, and time. He appears well-developed and well-nourished.  HENT:  Head: Normocephalic and atraumatic.  Right Ear: External ear normal.  Left Ear: External ear normal.  Nose: Nose normal.  Eyes: EOM are normal. Pupils are equal, round, and reactive to light. Right eye exhibits no discharge. Left eye exhibits no discharge.  Neck: Normal range of motion. Neck supple.  Cardiovascular:  Regular rhythm and normal heart sounds.  Tachycardia present.   Pulmonary/Chest: Effort normal and breath sounds normal.  Abdominal: Soft. He exhibits no distension. There is no tenderness.  Musculoskeletal: He exhibits no edema.  Neurological: He is alert and oriented to person, place, and time.  Alert, oriented x 3 through interpreter. No facial droop. Normal finger to nose. 5/5 strength in all 4 extremities  Skin: Skin is warm and dry. He is not diaphoretic.  Nursing note and vitals reviewed.    ED Treatments / Results  Labs (all labs ordered are listed, but only abnormal results are displayed) Labs Reviewed  COMPREHENSIVE METABOLIC PANEL - Abnormal; Notable for the following:       Result Value   Glucose, Bld 380 (*)    Creatinine, Ser 1.44 (*)    GFR calc non Af Amer 50 (*)    GFR calc Af Amer 57 (*)    All other components within normal limits  CBC WITH DIFFERENTIAL/PLATELET -  Abnormal; Notable for the following:    WBC 12.2 (*)    RBC 3.97 (*)    Hemoglobin 11.8 (*)    HCT 34.6 (*)    Neutro Abs 10.6 (*)    All other components within normal limits  URINALYSIS, ROUTINE W REFLEX MICROSCOPIC (NOT AT [facility]) - Abnormal; Notable for the following:    APPearance CLOUDY (*)    Glucose, UA >1000 (*)    Hgb urine dipstick SMALL (*)    Leukocytes, UA MODERATE (*)    All other components within normal limits  URINE MICROSCOPIC-ADD ON - Abnormal; Notable for the following:    Squamous Epithelial / LPF 0-5 (*)    Bacteria, UA RARE (*)    All other components within normal limits  CULTURE, BLOOD (ROUTINE X 2)  CULTURE, BLOOD (ROUTINE X 2)  URINE CULTURE  CULTURE, EXPECTORATED SPUTUM-ASSESSMENT  GRAM STAIN  HIV ANTIBODY (ROUTINE TESTING)  STREP PNEUMONIAE URINARY ANTIGEN  MAGNESIUM  TROPONIN I  HEMOGLOBIN A1C  INFLUENZA PANEL BY PCR (TYPE A & B, H1N1)  I-STAT CG4 LACTIC ACID, ED    EKG  EKG Interpretation None       Radiology Dg Chest 2 View  Result Date:  08/27/2016 CLINICAL DATA:  Two days of fever and weakness with syncopal episode. Currently has a fever and exhibits orthostasis EXAM: CHEST  2 VIEW COMPARISON:  Portable chest x-ray of November 27, 2015 FINDINGS: The lungs are adequately inflated. There is increased density in the right lower lobe. The left lung is clear. The heart and pulmonary vascularity are normal. The mediastinum is normal in width. There is calcification in the wall of the aortic arch. The bony thorax exhibits no acute abnormality. IMPRESSION: Probable right lower lobe pneumonia. Followup PA and lateral chest X-ray is recommended in 3-4 weeks following trial of antibiotic therapy to ensure resolution and exclude underlying malignancy. Aortic atherosclerosis. Electronically Signed   By: David  Martinique M.D.   On: 08/27/2016 10:02   Ct Head Wo Contrast  Result Date: 08/27/2016 CLINICAL DATA:  Fevers and weakness EXAM: CT HEAD WITHOUT CONTRAST TECHNIQUE: Contiguous axial images were obtained from the base of the skull through the vertex without intravenous contrast. COMPARISON:  11/27/2015 FINDINGS: Brain: No evidence of acute infarction, hemorrhage, hydrocephalus, extra-axial collection or mass lesion/mass effect. Mild atrophic changes are again seen. Vascular: No hyperdense vessel or unexpected calcification. Skull: Normal. Negative for fracture or focal lesion. Sinuses/Orbits: Mild mucosal thickening in the ethmoid sinuses is noted. Other: None. IMPRESSION: Mild atrophic changes.  No acute intracranial abnormality noted. Mild sinus disease. Electronically Signed   By: Inez Catalina M.D.   On: 08/27/2016 10:26    Procedures Procedures (including critical care time)  Medications Ordered in ED Medications  sodium chloride 0.9 % bolus 1,000 mL (not administered)  acetaminophen (TYLENOL) tablet 650 mg (not administered)     Initial Impression / Assessment and Plan / ED Course  I have reviewed the triage vital signs and the nursing  notes.  Pertinent labs & imaging results that were available during my care of the patient were reviewed by me and considered in my medical decision making (see chart for details).  Clinical Course  Comment By Time  Will workup for fever. He is not altered. HA sounds chronic, no meningismus or AMS. No obvious source. Given recent UTI and nausea, will check urine in addition to CXR. Fluids, tylenol. No hypotension Sherwood Gambler, MD 10/30 [phone]  PNA seen on CXR. Given fever  and WBC, will treat as HCAP. No cough Sherwood Gambler, MD 10/30 1026  IM teaching service to admit Sherwood Gambler, MD 10/30 1320    [place] is ok. No signs of severe sepsis. Will cover for PNA but it's probably more likely UTI given no cough. Admit to IM teaching service  Final Clinical Impressions(s) / ED Diagnoses   Final diagnoses:  CAP (community acquired pneumonia)  Urinary tract infection without hematuria, site unspecified    New Prescriptions New Prescriptions   No medications on file     Sherwood Gambler, MD 08/27/16 1657

## 2016-08-27 NOTE — ED Triage Notes (Signed)
Pt in from home after near syncopal episode, fever and weakness x 2 days. On ED arrival, rectal temp of 102F. Per EMS, pt was pale, diaphoretic on their arrival, had positive orthostatic VS. Alert, [place]-speaking. Hx of DM, CBG 312. States he has been having HA's with fevers for 2 years.

## 2016-08-27 NOTE — Progress Notes (Signed)
Pharmacy Antibiotic Note  Brian Owen is a 65 y.o. male admitted on 08/27/2016 with near syncope. Pt complains of fever and weakness. Tm 102, SCr 1 > 1.4, eCrCl 35-40 ml/min, WBC 12.2. Initiating empiric antibiotics for possible pneumonia. Received one dose of cefepime in the ED.   Plan: -Vancomycin 1 g IV x1 then 750 mg IV q24h -Monitor renal fx, cultures, VT as needed  -F/u for continuation of gram neg coverage   Height: 5\' 6"  (167.6 cm) Weight: 114 lb (51.7 kg) IBW/kg (Calculated) : 63.8  Temp (24hrs), Avg:102 F (38.9 C), Min:102 F (38.9 C), Max:102 F (38.9 C)   Recent Labs Lab 08/27/16 0930 08/27/16 0956  WBC 12.2*  --   CREATININE 1.44*  --   LATICACIDVEN  --  1.87    Estimated Creatinine Clearance: 37.4 mL/min (by C-G formula based on SCr of 1.44 mg/dL (H)).      Antimicrobials this admission: 10/30 cefepime x1 10/30 vancomycin >  Dose adjustments this admission: NA  Microbiology results: 10/30 blood cx: 10/30 urine cx:  Thank you for allowing pharmacy to be a part of this patient's care.  Harvel Quale 08/27/2016 11:28 AM

## 2016-08-28 ENCOUNTER — Encounter (HOSPITAL_COMMUNITY): Payer: Self-pay | Admitting: Student

## 2016-08-28 ENCOUNTER — Observation Stay (HOSPITAL_COMMUNITY): Payer: Medicare Other

## 2016-08-28 DIAGNOSIS — N390 Urinary tract infection, site not specified: Secondary | ICD-10-CM | POA: Diagnosis present

## 2016-08-28 DIAGNOSIS — Z7982 Long term (current) use of aspirin: Secondary | ICD-10-CM | POA: Diagnosis not present

## 2016-08-28 DIAGNOSIS — E1143 Type 2 diabetes mellitus with diabetic autonomic (poly)neuropathy: Secondary | ICD-10-CM | POA: Diagnosis present

## 2016-08-28 DIAGNOSIS — Z794 Long term (current) use of insulin: Secondary | ICD-10-CM | POA: Diagnosis not present

## 2016-08-28 DIAGNOSIS — E860 Dehydration: Secondary | ICD-10-CM | POA: Diagnosis present

## 2016-08-28 DIAGNOSIS — E1122 Type 2 diabetes mellitus with diabetic chronic kidney disease: Secondary | ICD-10-CM | POA: Diagnosis not present

## 2016-08-28 DIAGNOSIS — J189 Pneumonia, unspecified organism: Secondary | ICD-10-CM

## 2016-08-28 DIAGNOSIS — Z87891 Personal history of nicotine dependence: Secondary | ICD-10-CM | POA: Diagnosis not present

## 2016-08-28 DIAGNOSIS — N183 Chronic kidney disease, stage 3 (moderate): Secondary | ICD-10-CM | POA: Diagnosis present

## 2016-08-28 DIAGNOSIS — Z8619 Personal history of other infectious and parasitic diseases: Secondary | ICD-10-CM

## 2016-08-28 DIAGNOSIS — E1165 Type 2 diabetes mellitus with hyperglycemia: Secondary | ICD-10-CM | POA: Diagnosis not present

## 2016-08-28 DIAGNOSIS — R8299 Other abnormal findings in urine: Secondary | ICD-10-CM | POA: Diagnosis not present

## 2016-08-28 DIAGNOSIS — R55 Syncope and collapse: Secondary | ICD-10-CM | POA: Diagnosis not present

## 2016-08-28 DIAGNOSIS — Z87440 Personal history of urinary (tract) infections: Secondary | ICD-10-CM

## 2016-08-28 DIAGNOSIS — I951 Orthostatic hypotension: Secondary | ICD-10-CM | POA: Diagnosis not present

## 2016-08-28 DIAGNOSIS — R81 Glycosuria: Secondary | ICD-10-CM | POA: Diagnosis present

## 2016-08-28 DIAGNOSIS — B952 Enterococcus as the cause of diseases classified elsewhere: Secondary | ICD-10-CM | POA: Diagnosis present

## 2016-08-28 DIAGNOSIS — R636 Underweight: Secondary | ICD-10-CM | POA: Diagnosis present

## 2016-08-28 DIAGNOSIS — I129 Hypertensive chronic kidney disease with stage 1 through stage 4 chronic kidney disease, or unspecified chronic kidney disease: Secondary | ICD-10-CM | POA: Diagnosis present

## 2016-08-28 DIAGNOSIS — Z681 Body mass index (BMI) 19 or less, adult: Secondary | ICD-10-CM | POA: Diagnosis not present

## 2016-08-28 DIAGNOSIS — N189 Chronic kidney disease, unspecified: Secondary | ICD-10-CM | POA: Diagnosis not present

## 2016-08-28 DIAGNOSIS — Z79899 Other long term (current) drug therapy: Secondary | ICD-10-CM | POA: Diagnosis not present

## 2016-08-28 LAB — COMPREHENSIVE METABOLIC PANEL
ALBUMIN: 2.9 g/dL — AB (ref 3.5–5.0)
ALK PHOS: 81 U/L (ref 38–126)
ALT: 17 U/L (ref 17–63)
ANION GAP: 7 (ref 5–15)
AST: 18 U/L (ref 15–41)
BILIRUBIN TOTAL: 0.4 mg/dL (ref 0.3–1.2)
BUN: 9 mg/dL (ref 6–20)
CALCIUM: 8.6 mg/dL — AB (ref 8.9–10.3)
CO2: 20 mmol/L — AB (ref 22–32)
Chloride: 109 mmol/L (ref 101–111)
Creatinine, Ser: 1.38 mg/dL — ABNORMAL HIGH (ref 0.61–1.24)
GFR calc Af Amer: >60 mL/min (ref >60)
GFR calc non Af Amer: 52 mL/min — ABNORMAL LOW (ref >60)
GLUCOSE: 170 mg/dL — AB (ref 65–99)
Potassium: 4.1 mmol/L (ref 3.5–5.1)
SODIUM: 136 mmol/L (ref 135–145)
TOTAL PROTEIN: 6.3 g/dL — AB (ref 6.5–8.1)

## 2016-08-28 LAB — CBC
HEMATOCRIT: 32 % — AB (ref 39.0–52.0)
Hemoglobin: 10.8 g/dL — ABNORMAL LOW (ref 13.0–17.0)
MCH: 29.7 pg (ref 26.0–34.0)
MCHC: 33.8 g/dL (ref 30.0–36.0)
MCV: 87.9 fL (ref 78.0–100.0)
Platelets: 162 10*3/uL (ref 150–400)
RBC: 3.64 MIL/uL — AB (ref 4.22–5.81)
RDW: 12.7 % (ref 11.5–15.5)
WBC: 7.7 10*3/uL (ref 4.0–10.5)

## 2016-08-28 LAB — GLUCOSE, CAPILLARY
GLUCOSE-CAPILLARY: 225 mg/dL — AB (ref 65–99)
Glucose-Capillary: 165 mg/dL — ABNORMAL HIGH (ref 65–99)
Glucose-Capillary: 169 mg/dL — ABNORMAL HIGH (ref 65–99)
Glucose-Capillary: 129 mg/dL — ABNORMAL HIGH (ref 65–99)

## 2016-08-28 LAB — HIV ANTIBODY (ROUTINE TESTING W REFLEX): HIV SCREEN 4TH GENERATION: NONREACTIVE

## 2016-08-28 LAB — HEMOGLOBIN A1C
Hgb A1c MFr Bld: 8.8 % — ABNORMAL HIGH (ref 4.8–5.6)
Mean Plasma Glucose: 206 mg/dL

## 2016-08-28 MED ORDER — MAGNESIUM SULFATE 2 GM/50ML IV SOLN
2.0000 g | Freq: Once | INTRAVENOUS | Status: AC
  Administered 2016-08-28: 2 g via INTRAVENOUS
  Filled 2016-08-28: qty 50

## 2016-08-28 MED ORDER — COSYNTROPIN 0.25 MG IJ SOLR
0.2500 mg | Freq: Once | INTRAMUSCULAR | Status: AC
  Administered 2016-08-29: 0.25 mg via INTRAVENOUS
  Filled 2016-08-28: qty 0.25

## 2016-08-28 MED ORDER — INSULIN ASPART PROT & ASPART (70-30 MIX) 100 UNIT/ML ~~LOC~~ SUSP
8.0000 [IU] | Freq: Two times a day (BID) | SUBCUTANEOUS | Status: DC
  Administered 2016-08-28 – 2016-08-29 (×2): 8 [IU] via SUBCUTANEOUS
  Filled 2016-08-28 (×2): qty 10

## 2016-08-28 NOTE — Progress Notes (Signed)
  Date: 08/28/2016  Patient name: Brian Owen  Medical record number: [id number]  Date of birth: [birth year]   I have seen and evaluated Lancer Gernert and discussed their care with the Residency Team. Briefly, Mr. Quiroa is a 65 yo man with PMH of CKD, DM.  He is [place] and a Optometrist was utilized for communication.  The patient experienced near syncope in the presence of his wife at home.  He noted that while trying to go to the restroom, he got dizzy and his wife helped him to the floor.  He did not lose consciousness or hit his head.  He had no seizure like activity.  He has had this happen a few times before and has been worked up in the past.  He had an AM cortisol in August of this year during a hospitalization which was low (drawn at 5am).  At that time he also had a TTE which was normal.  Complicating the picture at that time was low blood sugar thought to be related to too high of an insulin dose and this was discontinued.  He was encouraged to wear TED hose and follow up with endocrinology, but I cannot tell if this was done.  During this admission he has had + orthostatic vital signs despite fluid resuscitation.  TSH was normal at that time. He denies any urinary or respiratory symptoms.   Vitals:   08/28/16 0426 08/28/16 0739  BP: (!) 107/56 136/69  Pulse: 78 88  Resp: 20 18  Temp: 98.4 F (36.9 C) 97.7 F (36.5 C)   Gen: Thin elderly man, sitting cross legged in bed Eyes: Anicteric, EOMI CV: RR, NR, No murmur Pulm: CTAB, no wheezing Abd: ND Ext: Multiple old scars from previous bullous disease, he cannot report more history to [place] Neuro: No focal motor deficit  EKG with sinus tachycardia  Initial CXR with focal opacity, improved on repeat CT of the chest  UA: Leukocytes noted, > 1000 glucose  Assessment and Plan: I have seen and evaluated the patient as outlined above. I agree with the formulated Assessment and Plan as detailed in the residents' note, with the following  changes:   1. Orthostatic hypotension - DDx includes medication (he is not currently on anything except insulin and a "red pill"), non-neurogenic causes such as DI, hyperglycemia, adrenal insufficiency, hypothyroidism.  Thyroid studies from August showed normal TSH.  He did have a low cortisol at last check (K normal).  He does have > 1000 glucose in the urine, possibly causing a diuresis.  - Would check ACTH, AM cortisol and do cosyntropin stim - Will ask resident team to Call his pharmacy ([facility] [address]) and see what meds he is picking up - Treat DM with insulin while here to decrease glucosuria (could he be taking invokana which causes glucosuria?) - Consider rechecking thyroid studies - TTE from August showed grade 1 dCHF only, no valve issues - Telemetry  2. Possible CAP - I do not feel strongly that he has a true infection.  He reports absolutely no symptoms - Decrease Abx to Azithromycin only - Consider stopping this medication tomorrow  3. DM2 - Would treat with SSI  Sid Falcon, MD 10/31/20171:18 PM

## 2016-08-28 NOTE — Care Management Obs Status (Signed)
[facility] NOTIFICATION   Patient Details  Name: Triton Heidrich MRN: [id number] Date of Birth: Mar 14, 1951   Medicare Observation Status Notification Given:  Yes    Carles Collet, RN 08/28/2016, 1:37 PM

## 2016-08-28 NOTE — Progress Notes (Signed)
Subjective: Patient was feeling better this morning. He has no complaints. An interpreter was used to communicate.  Objective:  Vital signs in last 24 hours: Vitals:   08/27/16 2348 08/28/16 0426 08/28/16 0739 08/28/16 1425  BP: (!) 106/57 (!) 107/56 136/69 139/69  Pulse: 76 78 88 85  Resp:  20 18 18   Temp:  98.4 F (36.9 C) 97.7 F (36.5 C) 98.6 F (37 C)  TempSrc:  Oral Oral   SpO2:  97% 100% 98%  Weight:      Height:       Orthostatic VS for the past 24 hrs:  BP- Lying Pulse- Lying BP- Sitting Pulse- Sitting BP- Standing at 0 minutes Pulse- Standing at 0 minutes  08/28/16 0739 137/64 84 133/69 89 (!) 84/55 88  08/27/16 1544 (!) 149/91 90 110/60 94 117/77 100    Gen. well-built, well-nourished man, in no acute distress. Lungs. Clear bilaterally CV. Regular rate and rhythm Abdomen. Soft, nontender, nondistended, bowel sounds positive. Extremities. No edema, no cyanosis, pulses 2+ bilaterally. Multiple healed scars on all extremities.  Labs. BMP Latest Ref Rng & Units 08/28/2016 08/27/2016 06/19/2016  Glucose 65 - 99 mg/dL 170(H) 380(H) 172(H)  BUN 6 - 20 mg/dL 9 12 19   Creatinine 0.61 - 1.24 mg/dL 1.38(H) 1.44(H) 1.24  Sodium 135 - 145 mmol/L 136 135 139  Potassium 3.5 - 5.1 mmol/L 4.1 4.1 4.3  Chloride 101 - 111 mmol/L 109 101 106  CO2 22 - 32 mmol/L 20(L) 25 28  Calcium 8.9 - 10.3 mg/dL 8.6(L) 9.3 9.3   CBC Latest Ref Rng & Units 08/28/2016 08/27/2016 06/19/2016  WBC 4.0 - 10.5 K/uL 7.7 12.2(H) 6.2  Hemoglobin 13.0 - 17.0 g/dL 10.8(L) 11.8(L) 11.7(L)  Hematocrit 39.0 - 52.0 % 32.0(L) 34.6(L) 34.8(L)  Platelets 150 - 400 K/uL 162 158 214   Magnesium. 1.6 A1c. 8.8 Influenza screen. Negative Urine for strep pneumonia. Negative  Assessment/Plan:  Brian Owen 65 y.o man with past medical history significant for diabetes and chronic kidney disease brought to ED by EMS after experiencing near syncope yesterday morning.  Orthostatic hypotension. He is still having  positive orthostatic. He might be having adrenal insufficiency as his previous a.m. cortisol level were mildly low. -A.m. cortisol level tomorrow morning. -Cosyntropin stimulation test tomorrow morning.  Uncontrolled diabetes. His recent A1c is 8.8. According to previous prescription and pharmacy refill he is on NovoLog Mix 70/30, 14 units twice a day. Patient states today that he is using that. Yesterday he told us that he stopped using his insulin after having hypoglycemic event. He was admitted twice recently because of hypoglycemia. I'm concerned he might be having some autoimmune process going on, and having insulin-dependent diabetes, or he is in honeymoon period of autoimmune pancreatitis. -Check glutamic acid decarboxylase antibody. -Check insulin and C-peptide level -Check anti insulin antibodies. -Add NovoLog Mix 70/30 8 units twice a day. -Continue sliding scale. -Monitor CBG.  CAP. His chest x-ray done today shows less patchy opacities and right lower lobe as compared to yesterday. His leukocytosis has been improved to 7.7 as compared to 12.2 yesterday. He denies any cough or sputum production. He is being afebrile since his admission, except one reading of fever of 102 yesterday. His urine for strep pneumonia came back negative. -Discontinue ceftriaxone. -Continue azithromycin for 4 more days.  Abnormal UA. His urine was positive for an thousand milligrams of glucose and leukocytes. He has an history of recurrent UTI with Enterococcus faecalis. Might be a chronic  colonization. His urine culture done yesterday shows more than 100,000 colonies of Enterococcus faecalis. He denies any urinary complaints. Might get benefit from urology referral as an outpatient for further evaluation.   Dispo: Anticipated discharge in approximately 1 day(s).   Lorella Nimrod, MD 08/28/2016, 3:24 PM Pager: [phone]

## 2016-08-29 DIAGNOSIS — R918 Other nonspecific abnormal finding of lung field: Secondary | ICD-10-CM

## 2016-08-29 DIAGNOSIS — N189 Chronic kidney disease, unspecified: Secondary | ICD-10-CM

## 2016-08-29 DIAGNOSIS — E1122 Type 2 diabetes mellitus with diabetic chronic kidney disease: Secondary | ICD-10-CM

## 2016-08-29 LAB — GLUCOSE, CAPILLARY
Glucose-Capillary: 152 mg/dL — ABNORMAL HIGH (ref 65–99)
Glucose-Capillary: 277 mg/dL — ABNORMAL HIGH (ref 65–99)

## 2016-08-29 LAB — CBC
HCT: 34.4 % — ABNORMAL LOW (ref 39.0–52.0)
Hemoglobin: 11.6 g/dL — ABNORMAL LOW (ref 13.0–17.0)
MCH: 29.5 pg (ref 26.0–34.0)
MCHC: 33.7 g/dL (ref 30.0–36.0)
MCV: 87.5 fL (ref 78.0–100.0)
PLATELETS: 172 10*3/uL (ref 150–400)
RBC: 3.93 MIL/uL — ABNORMAL LOW (ref 4.22–5.81)
RDW: 12.6 % (ref 11.5–15.5)
WBC: 5 10*3/uL (ref 4.0–10.5)

## 2016-08-29 LAB — C-PEPTIDE: C PEPTIDE: 1.6 ng/mL (ref 1.1–4.4)

## 2016-08-29 LAB — URINE CULTURE

## 2016-08-29 LAB — GLUTAMIC ACID DECARBOXYLASE AUTO ABS: Glutamic Acid Decarb Ab: <5 U/mL (ref 0.0–5.0)

## 2016-08-29 LAB — BASIC METABOLIC PANEL
Anion gap: 8 (ref 5–15)
BUN: 8 mg/dL (ref 6–20)
CALCIUM: 9.3 mg/dL (ref 8.9–10.3)
CHLORIDE: 106 mmol/L (ref 101–111)
CO2: 25 mmol/L (ref 22–32)
CREATININE: 1.31 mg/dL — AB (ref 0.61–1.24)
GFR calc Af Amer: >60 mL/min (ref >60)
GFR calc non Af Amer: 56 mL/min — ABNORMAL LOW (ref >60)
GLUCOSE: 156 mg/dL — AB (ref 65–99)
Potassium: 4.1 mmol/L (ref 3.5–5.1)
Sodium: 139 mmol/L (ref 135–145)

## 2016-08-29 LAB — MAGNESIUM: Magnesium: 2 mg/dL (ref 1.7–2.4)

## 2016-08-29 LAB — INSULIN, RANDOM: Insulin: 10.2 u[IU]/mL (ref 2.6–24.9)

## 2016-08-29 LAB — CORTISOL-AM, BLOOD: CORTISOL - AM: 22.6 ug/dL (ref 6.7–22.6)

## 2016-08-29 LAB — ACTH STIMULATION, 3 TIME POINTS
CORTISOL 30 MIN: 19.4 ug/dL
CORTISOL BASE: 12.2 ug/dL
Cortisol, 60 Min: 22 ug/dL

## 2016-08-29 MED ORDER — INSULIN ASPART PROT & ASPART (70-30 MIX) 100 UNIT/ML ~~LOC~~ SUSP: 8.0000 [IU] | Freq: Two times a day (BID) | SUBCUTANEOUS | 11 refills | Status: DC

## 2016-08-29 NOTE — Discharge Summary (Signed)
Name: Brian Owen MRN: [id number] DOB: Oct 10, 1951 65 y.o. PCP: Brian Nearing, MD  Date of Admission: 08/27/2016  9:14 AM Date of Discharge: 08/29/2016 Attending Physician: No att. providers found  Discharge Diagnosis: Active Problems:   Diabetes mellitus type 2, uncontrolled (HCC)   Autonomic neuropathy due to diabetes (HCC)   Orthostatic hypotension   CAP (community acquired pneumonia)   Discharge Medications:   Medication List    STOP taking these medications   cephALEXin 500 MG capsule Commonly known as:  KEFLEX   insulin NPH-regular Human (70-30) 100 UNIT/ML injection Commonly known as:  NOVOLIN 70/30   thiamine 100 MG tablet     TAKE these medications   ACCU-CHEK AVIVA PLUS w/Device Kit 1 Device by Does not apply route 3 (three) times daily after meals. E11.65   ACCU-CHEK SOFTCLIX LANCETS lancets 1 each by Other route 3 (three) times daily. E11.65   acetaminophen 500 MG tablet Commonly known as:  TYLENOL Take 500 mg by mouth every 6 (six) hours as needed for moderate pain or headache.   aspirin 81 MG EC tablet Take 1 tablet (81 mg total) by mouth daily. What changed:  Another medication with the same name was removed. Continue taking this medication, and follow the directions you see here.   camphor-menthol lotion Commonly known as:  SARNA Apply 1 application topically as needed for itching.   feeding supplement (PRO-STAT SUGAR FREE 64) Liqd Take 30 mLs by mouth 3 (three) times daily with meals.   glucose blood test strip Commonly known as:  ACCU-CHEK AVIVA PLUS 1 each by Other route 3 (three) times daily. E11.65   insulin aspart protamine- aspart (70-30) 100 UNIT/ML injection Commonly known as:  NOVOLOG MIX 70/30 Inject 0.08 mLs (8 Units total) into the skin 2 (two) times daily with a meal.   Insulin Syringe-Needle U-100 25G X 1" 1 ML Misc Commonly known as:  B-D INSULIN SYRINGE 1CC/25GX1" Use as directed       Disposition and follow-up:     BrianBrian Owen was discharged from [facility] in Good condition.  At the hospital follow up visit please address:  1.  His blood sugar and orthostatic vitals. -. His blood pressure was mostly on the softer side during this admission, in the future he can be considered to start on ACE inhibitors. -He will need a outpatient referral to urology because of his recurrent UTI with Enterococcus faecalis   2.  Labs / imaging needed at time of follow-up: None  3.  Pending labs/ test needing follow-up: Insulin antibodies.  Follow-up Appointments: Follow-up Information    Brian Ends, MD. Schedule an appointment as soon as possible for a visit today.   Specialty:  Family Medicine Why:  Please make an appointment with your PCP to be seen next week. Contact information: [address] [place] [address] [phone]           Hospital Course by problem list:  Brian Owen 65 y.o manwith past medical history significant for diabetes and chronic kidney disease brought to ED by EMS after experiencing near syncope.  Orthostatic hypotension. He was found to have positive orthostatic vitals. He had a couple of previous admissions with a complaint of dizziness and found to have orthostatic and hypoglycemia during previous one. During that time he was tested with a.m. cortisol which was low. With his recurrent episodes of positive orthostatic vitals, at renal insufficiency was suspected. We did cosyntropin stimulation test, it was normal  with a normal basal a.m. cortisol level. His positive orthostatic vitals were  most probably due to dehydration in the setting of heavy glucosuria , or some autonomic dysfunction because of uncontrolled diabetes. He has no complaints regarding it to any neuropathy. His orthostatic vitals improved with rehydration, he had negative orthostatic vitals on the day of discharge. He was instructed to keep himself well hydrated, and control his  blood sugar.  Uncontrolled diabetes. His current A1c was 8.8, it was 7.8, 2 month ago during his previous admission. We had a long discussion with patient and his family about the better control of his diabetes.Patient was scared that he might have hypoglycemia with insulin, as he did had a couple of hypoglycemic events in the past. His glutamic acid decarboxylase antibody, insulin and C-peptide levels are all normal. He needs a close follow-up with his PCP for better control of his diabetes.  Questionable right lower lobe opacity.  he has a patchy opacity in his right lower lobe concerning for pneumonia on admission. He did had mildly elevated leukocyte. He remained afebrile and denies any cough. On repeat chest x-ray there was an improvement in that opacities. Initially he was treated with ceftriaxone and azithromycin, later ceftriaxone was discontinued and he completed  Azithromycin.  Abnormal UA.His urine was positive for above 1000 milligrams of glucose and leukocytes. He has a  history of recurrent UTI including this admission with Enterococcus faecalis with same sensitivity each time. Most probably is a chronic colonization. He is asymptomatic. He will benefit from an outpatient urology referral to further evaluate.   Discharge Vitals:   BP 140/72 (BP Location: Right Arm)   Pulse 87   Temp 98.6 F (37 C) (Oral)   Resp 18   Ht '5\' 6"'  (1.676 m)   Wt 114 lb (51.7 kg)   SpO2 100%   BMI 18.40 kg/m   Gen.well-built, well-nourished man, sitting comfortably in his bed, in no acute distress. CV. Regular rate and rhythm Lungs. Clear bilaterally. Abdomen.Soft, nontender, nondistended, bowel sounds positive. Extremities.No edema, no cyanosis, pulses 2+ bilaterally.Multiple healed scars on all extremities.  Pertinent Labs, Studies, and Procedures:   CBC    Component Value Date/Time   WBC 5.0 08/29/2016 0658   RBC 3.93 (L) 08/29/2016 0658   HGB 11.6 (L) 08/29/2016 0658   HCT 34.4  (L) 08/29/2016 0658   HCT 31 08/10/2014   PLT 172 08/29/2016 0658   PLT 261 08/10/2014   MCV 87.5 08/29/2016 0658   MCH 29.5 08/29/2016 0658   MCHC 33.7 08/29/2016 0658   RDW 12.6 08/29/2016 0658   LYMPHSABS 1.0 08/27/2016 0930   MONOABS 0.5 08/27/2016 0930   EOSABS 0.1 08/27/2016 0930   BASOSABS 0.0 08/27/2016 0930   BMP Latest Ref Rng & Units 08/29/2016 08/28/2016 08/27/2016  Glucose 65 - 99 mg/dL 156(H) 170(H) 380(H)  BUN 6 - 20 mg/dL '8 9 12  ' Creatinine 0.61 - 1.24 mg/dL 1.31(H) 1.38(H) 1.44(H)  Sodium 135 - 145 mmol/L 139 136 135  Potassium 3.5 - 5.1 mmol/L 4.1 4.1 4.1  Chloride 101 - 111 mmol/L 106 109 101  CO2 22 - 32 mmol/L 25 20(L) 25  Calcium 8.9 - 10.3 mg/dL 9.3 8.6(L) 9.3   Urinalysis    Component Value Date/Time   COLORURINE YELLOW 08/27/2016 1147   APPEARANCEUR CLOUDY (A) 08/27/2016 1147   LABSPEC 1.013 08/27/2016 1147   PHURINE 6.0 08/27/2016 1147   GLUCOSEU >1000 (A) 08/27/2016 1147   HGBUR SMALL (A) 08/27/2016 1147  BILIRUBINUR NEGATIVE 08/27/2016 1147   BILIRUBINUR neg 12/05/2015 0910   KETONESUR NEGATIVE 08/27/2016 1147   PROTEINUR NEGATIVE 08/27/2016 1147   UROBILINOGEN 0.2 12/05/2015 0910   UROBILINOGEN 0.2 07/22/2015 1941   NITRITE NEGATIVE 08/27/2016 1147   LEUKOCYTESUR MODERATE (A) 08/27/2016 1147   Urine microscopic-add on  Order: [id number]  Status:  Final result Visible to patient:  No (Not Released) Next appt:  None    Ref Range & Units 11:47 24moago   Squamous Epithelial / LPF NONE SEEN 0-5   NONE SEEN    WBC, UA 0 - 5 WBC/hpf TOO NUMEROUS TO COUNT  6-30    RBC / HPF 0 - 5 RBC/hpf 0-5  NONE SEEN    Bacteria, UA NONE SEEN RARE   NONE SEEN             Urine Culture. Specimen Description URINE, RANDOM   Special Requests NONE   Culture >=100,000 COLONIES/mL ENTEROCOCCUS FAECALIS    Report Status 08/29/2016 FINAL   Organism ID, Bacteria ENTEROCOCCUS FAECALIS    Resulting Agency [facility]  Susceptibility     Enterococcus faecalis    MIC    AMPICILLIN <=2 SENSITIVE "><=2 SENSITIVE  Sensitive    LEVOFLOXACIN 1 SENSITIVE  Sensitive    NITROFURANTOIN <=16 SENSITIVE "><=16 SENSIT... Sensitive    VANCOMYCIN 1 SENSITIVE  Sensitive         Susceptibility Comments   Enterococcus faecalis  >=100,000 COLONIES/mL ENTEROCOCCUS FAECALIS      Lactic Acid. 1.87 Magnesium. 1.6 >> 2.0 A1c. 8.8 Influenza screen. Negative Urine for strep pneumonia. Negative  EKG: Sinus tachycardia, with some J-point elevation in V2 and V3.  CXR: FINDINGS: The lungs are adequately inflated. There is increased density in the right lower lobe. The left lung is clear. The heart and pulmonary vascularity are normal. The mediastinum is normal in width. There is calcification in the wall of the aortic arch. The bony thorax exhibits no acute abnormality.  IMPRESSION: Probable right lower lobe pneumonia. Followup PA and lateral chest X-ray is recommended in 3-4 weeks following trial of antibiotic therapy to ensure resolution and exclude underlying malignancy.  Aortic atherosclerosis.  CT Head WO Contrast. FINDINGS: Brain: No evidence of acute infarction, hemorrhage, hydrocephalus, extra-axial collection or mass lesion/mass effect. Mild atrophic changes are again seen.  Vascular: No hyperdense vessel or unexpected calcification.  Skull: Normal. Negative for fracture or focal lesion.  Sinuses/Orbits: Mild mucosal thickening in the ethmoid sinuses is noted.  Other: None.  IMPRESSION: Mild atrophic changes. No acute intracranial abnormality noted.  Mild sinus disease.  ACTH stimulation, 3 time points  Order: [id number] Status:  Final result Visible to patient:  No (Not Released) Next appt:  None   Ref Range & Units 06:58  Cortisol, Base ug/dL 12.2   Comments: NO NORMAL RANGE ESTABLISHED FOR THIS TEST  Cortisol, 30 Min ug/dL 19.4   Cortisol, 60 Min ug/dL 22.0        C Peptide. 1.6 Insulin.  10.2 GAD Antibody. <0.5  Discharge Instructions: Discharge Instructions    Diet - low sodium heart healthy    Complete by:  As directed    Discharge instructions    Complete by:  As directed    It was pleasure taking care of you. Please keep yourself well hydrated, drink plenty of fluids. Please follow-up with your PCP at clinic, so they can adjust your insulin need and keep your diabetes under good control.   Increase activity slowly  Complete by:  As directed       Signed: Lorella Nimrod, MD 08/29/2016, 4:38 PM   Pager: [phone]

## 2016-08-29 NOTE — Progress Notes (Signed)
Orthostatic Vital signs   Lying: Bp: 112/62 Hr: 86  Sitting:  Bp: 100/63  Hr: 77  Standing: Bp: 104/85  Hr: 103  Standing @3  minutes: Bp: 104/85  Hr: 102

## 2016-08-29 NOTE — Care Management Note (Signed)
Case Management Note  Patient Details  Name: Brian Owen MRN: [id number] Date of Birth: 04-09-1951  Subjective/Objective:                 Patient from home with family.    Action/Plan:  DC to home, no CM needs identified.   Expected Discharge Date:                  Expected Discharge Plan:  Home/Self Care  In-House Referral:  NA  Discharge planning Services  CM Consult  Post Acute Care Choice:  NA Choice offered to:  NA  DME Arranged:  N/A DME Agency:  NA  HH Arranged:  NA HH Agency:  NA  Status of Service:  Completed, signed off  If discussed at [place] of Stay Meetings, dates discussed:    Additional Comments:  Carles Collet, RN 08/29/2016, 1:06 PM

## 2016-08-29 NOTE — Progress Notes (Signed)
Subjective: Patient was feeling much better this morning. Denies any more dizziness. He has no other complaints. He wants to go home. We told him through an interpreter is needed to control his blood sugar better, keep himself well hydrated and follow up closely with his PCP. We also told him about his results of cosyntropin stimulation test which was normal.  Objective:  Vital signs in last 24 hours: Vitals:   08/29/16 0758 08/29/16 0802 08/29/16 0804 08/29/16 1114  BP: (!) 166/86 (!) 146/90 105/69 140/72  Pulse: 98 96 94 87  Resp: 19 19 18    Temp: 98.2 F (36.8 C)   98.6 F (37 C)  TempSrc: Oral   Oral  SpO2: 100% 100% 100% 100%  Weight:      Height:       Orthostatic VS for the past 24 hrs:  BP- Lying Pulse- Lying BP- Sitting Pulse- Sitting BP- Standing at 0 minutes Pulse- Standing at 0 minutes  08/29/16 0804 - - - - 105/69 76  08/29/16 0802 - - 146/90 102 - -  08/29/16 0758 166/86 98 - - - -  08/29/16 0536 112/62 86 100/63 77 104/85 103     Gen. well-built, well-nourished man, sitting comfortably in his bed, in no acute distress. CV. Regular rate and rhythm Lungs. Clear bilaterally. Abdomen. Soft, nontender, nondistended, bowel sounds positive. Extremities. No edema, no cyanosis, pulses 2+ bilaterally. Multiple healed scars on all extremities.  Labs. CBC Latest Ref Rng & Units 08/29/2016 08/28/2016 08/27/2016  WBC 4.0 - 10.5 K/uL 5.0 7.7 12.2(H)  Hemoglobin 13.0 - 17.0 g/dL 11.6(L) 10.8(L) 11.8(L)  Hematocrit 39.0 - 52.0 % 34.4(L) 32.0(L) 34.6(L)  Platelets 150 - 400 K/uL 172 162 158   BMP Latest Ref Rng & Units 08/29/2016 08/28/2016 08/27/2016  Glucose 65 - 99 mg/dL 156(H) 170(H) 380(H)  BUN 6 - 20 mg/dL 8 9 12   Creatinine 0.61 - 1.24 mg/dL 1.31(H) 1.38(H) 1.44(H)  Sodium 135 - 145 mmol/L 139 136 135  Potassium 3.5 - 5.1 mmol/L 4.1 4.1 4.1  Chloride 101 - 111 mmol/L 106 109 101  CO2 22 - 32 mmol/L 25 20(L) 25  Calcium 8.9 - 10.3 mg/dL 9.3 8.6(L) 9.3   ACTH  stimulation, 3 time points  Order: [id number]  Status:  Final result Visible to patient:  No (Not Released) Next appt:  None   Ref Range & Units 06:58  Cortisol, Base ug/dL 12.2   Comments: NO NORMAL RANGE ESTABLISHED FOR THIS TEST  Cortisol, 30 Min ug/dL 19.4   Cortisol, 60 Min ug/dL 22.0         Assessment/Plan:  Mr. Arteaga 65 y.o manwith past medical history significant for diabetes and chronic kidney disease brought to ED by EMS after experiencing near syncope yesterday morning.  Orthostatic hypotension. His orthostatic vitals today were normal with out any significant decrease in his BP. His cosyntropin stimulation test done today morning is normal and normal basal cortisol level.at this point his orthostatic vitals were most probably due to dehydration in the setting of heavy glucosuria , or some autonomic dysfunction because of uncontrolled diabetes .he has no complaints regarding it to any neuropathy.his orthostatic vitals were normal today after rehydration. He can be discharged home with instructions to keep himself well hydrated and control his blood sugar.  Uncontrolled diabetes. Patient was scared that he might have hypoglycemia with insulin, as he did had a couple of hypoglycemic events in the past. We told him the importance of better control and  rate regular follow-up with his PCP. His results for glutamic acid decarboxylase antibody, insulin and C-peptide levels are still pending.  Questionable right lower lobe opacity. He remains afebrile and denies any cough. He already had 3 doses of azithromycin 500 mg daily. We will discontinue his antibiotics.  Abnormal UA. His urine was positive for an thousand milligrams of glucose and leukocytes. He hasn't history of recurrent UTI including this admission with Enterococcus faecalis with same sensitivity each time. Most probably is a chronic colonization. He is asymptomatic. He will benefit from an outpatient urology referral to  further evaluate.   Dispo: Being discharged today.   Lorella Nimrod, MD 08/29/2016, 11:32 AM Pager: [phone]

## 2016-08-29 NOTE — Progress Notes (Addendum)
  Date: 08/29/2016  Patient name: [place] record number: [id number]  Date of birth: 10/17/1951   This patient's plan of care was discussed with the house staff. Please see their note for complete details. I concur with their findings. He was seen with the use of a phone interpreter.   Pt feels better and wishes to go home.   Vitals:   08/29/16 0804 08/29/16 1114  BP: 105/69 140/72  Pulse: 94 87  Resp: 18 18  Temp:  98.6 F (37 C)   CV: RRR Chest: clear Abd: bs+ soft, non-tender.   Labs of note: Cortisol stim test- normal Cr 1.31  A/p Orthostatic Hypotension His initial troponin was negative.  He has improved. He does not have adrenal insufficiency Suspect due to his hyperglycemia, fluid imbalance, possible autonomic neuropathy from DM   DM Glc 156 today He will f/u with his PCP within a week We spent a significant amt of time with him explaining the importance of fluid balance, diabetic control.   D/c home today  Campbell Riches, MD 08/29/2016, 11:45 AM

## 2016-08-31 ENCOUNTER — Other Ambulatory Visit: Payer: Self-pay | Admitting: Family Medicine

## 2016-08-31 DIAGNOSIS — Z794 Long term (current) use of insulin: Principal | ICD-10-CM

## 2016-08-31 DIAGNOSIS — E1100 Type 2 diabetes mellitus with hyperosmolarity without nonketotic hyperglycemic-hyperosmolar coma (NKHHC): Secondary | ICD-10-CM

## 2016-08-31 MED ORDER — INSULIN ASPART PROT & ASPART (70-30 MIX) 100 UNIT/ML ~~LOC~~ SUSP: 8.0000 [IU] | Freq: Two times a day (BID) | SUBCUTANEOUS | 0 refills | Status: DC

## 2016-09-01 LAB — CULTURE, BLOOD (ROUTINE X 2)
Culture: NO GROWTH
Culture: NO GROWTH

## 2016-09-04 LAB — INSULIN ANTIBODIES, BLOOD: Insulin Antibodies, Human: 217 uU/mL — ABNORMAL HIGH

## 2017-01-13 ENCOUNTER — Encounter (HOSPITAL_COMMUNITY): Payer: Self-pay | Admitting: Emergency Medicine

## 2017-01-13 ENCOUNTER — Emergency Department (HOSPITAL_COMMUNITY): Payer: Medicare Other

## 2017-01-13 ENCOUNTER — Inpatient Hospital Stay (HOSPITAL_COMMUNITY)
Admission: EM | Admit: 2017-01-13 | Discharge: 2017-01-16 | DRG: 312 | Disposition: A | Discharge status: Home Health | Payer: Medicare Other | Attending: Family Medicine | Admitting: Family Medicine

## 2017-01-13 DIAGNOSIS — I951 Orthostatic hypotension: Secondary | ICD-10-CM | POA: Diagnosis not present

## 2017-01-13 DIAGNOSIS — E871 Hypo-osmolality and hyponatremia: Secondary | ICD-10-CM

## 2017-01-13 DIAGNOSIS — L97909 Non-pressure chronic ulcer of unspecified part of unspecified lower leg with unspecified severity: Secondary | ICD-10-CM | POA: Diagnosis not present

## 2017-01-13 DIAGNOSIS — R739 Hyperglycemia, unspecified: Secondary | ICD-10-CM | POA: Diagnosis not present

## 2017-01-13 DIAGNOSIS — N390 Urinary tract infection, site not specified: Secondary | ICD-10-CM | POA: Diagnosis not present

## 2017-01-13 DIAGNOSIS — N183 Chronic kidney disease, stage 3 unspecified: Secondary | ICD-10-CM

## 2017-01-13 DIAGNOSIS — E118 Type 2 diabetes mellitus with unspecified complications: Secondary | ICD-10-CM | POA: Diagnosis not present

## 2017-01-13 DIAGNOSIS — Z833 Family history of diabetes mellitus: Secondary | ICD-10-CM

## 2017-01-13 DIAGNOSIS — E11622 Type 2 diabetes mellitus with other skin ulcer: Secondary | ICD-10-CM | POA: Diagnosis present

## 2017-01-13 DIAGNOSIS — Z87891 Personal history of nicotine dependence: Secondary | ICD-10-CM

## 2017-01-13 DIAGNOSIS — E43 Unspecified severe protein-calorie malnutrition: Secondary | ICD-10-CM | POA: Diagnosis present

## 2017-01-13 DIAGNOSIS — E1100 Type 2 diabetes mellitus with hyperosmolarity without nonketotic hyperglycemic-hyperosmolar coma (NKHHC): Secondary | ICD-10-CM

## 2017-01-13 DIAGNOSIS — N179 Acute kidney failure, unspecified: Secondary | ICD-10-CM | POA: Diagnosis present

## 2017-01-13 DIAGNOSIS — E1165 Type 2 diabetes mellitus with hyperglycemia: Secondary | ICD-10-CM | POA: Diagnosis not present

## 2017-01-13 DIAGNOSIS — E860 Dehydration: Secondary | ICD-10-CM | POA: Diagnosis present

## 2017-01-13 DIAGNOSIS — I129 Hypertensive chronic kidney disease with stage 1 through stage 4 chronic kidney disease, or unspecified chronic kidney disease: Secondary | ICD-10-CM | POA: Diagnosis present

## 2017-01-13 DIAGNOSIS — R55 Syncope and collapse: Secondary | ICD-10-CM | POA: Diagnosis present

## 2017-01-13 DIAGNOSIS — R42 Dizziness and giddiness: Secondary | ICD-10-CM

## 2017-01-13 DIAGNOSIS — L98499 Non-pressure chronic ulcer of skin of other sites with unspecified severity: Secondary | ICD-10-CM

## 2017-01-13 DIAGNOSIS — E1122 Type 2 diabetes mellitus with diabetic chronic kidney disease: Secondary | ICD-10-CM | POA: Diagnosis present

## 2017-01-13 DIAGNOSIS — Z794 Long term (current) use of insulin: Secondary | ICD-10-CM

## 2017-01-13 DIAGNOSIS — E1143 Type 2 diabetes mellitus with diabetic autonomic (poly)neuropathy: Secondary | ICD-10-CM | POA: Diagnosis present

## 2017-01-13 DIAGNOSIS — B952 Enterococcus as the cause of diseases classified elsewhere: Secondary | ICD-10-CM | POA: Diagnosis present

## 2017-01-13 LAB — CBG MONITORING, ED: Glucose-Capillary: 228 mg/dL — ABNORMAL HIGH (ref 65–99)

## 2017-01-13 LAB — CBC WITH DIFFERENTIAL/PLATELET
Basophils Absolute: 0.1 10*3/uL (ref 0.0–0.1)
Basophils Relative: 1 %
Eosinophils Absolute: 0.4 10*3/uL (ref 0.0–0.7)
Eosinophils Relative: 5 %
HCT: 39.2 % (ref 39.0–52.0)
Hemoglobin: 13 g/dL (ref 13.0–17.0)
Lymphocytes Relative: 37 %
Lymphs Abs: 2.6 10*3/uL (ref 0.7–4.0)
MCH: 29.5 pg (ref 26.0–34.0)
MCHC: 33.2 g/dL (ref 30.0–36.0)
MCV: 88.9 fL (ref 78.0–100.0)
Monocytes Absolute: 0.4 10*3/uL (ref 0.1–1.0)
Monocytes Relative: 5 %
Neutro Abs: 3.7 10*3/uL (ref 1.7–7.7)
Neutrophils Relative %: 52 %
Platelets: 176 10*3/uL (ref 150–400)
RBC: 4.41 MIL/uL (ref 4.22–5.81)
RDW: 13.2 % (ref 11.5–15.5)
WBC: 7.1 10*3/uL (ref 4.0–10.5)

## 2017-01-13 LAB — BASIC METABOLIC PANEL
ANION GAP: 10 (ref 5–15)
BUN: 20 mg/dL (ref 6–20)
CALCIUM: 9.5 mg/dL (ref 8.9–10.3)
CHLORIDE: 97 mmol/L — AB (ref 101–111)
CO2: 22 mmol/L (ref 22–32)
Creatinine, Ser: 2.24 mg/dL — ABNORMAL HIGH (ref 0.61–1.24)
GFR calc non Af Amer: 29 mL/min — ABNORMAL LOW (ref >60)
GFR, EST AFRICAN AMERICAN: 33 mL/min — AB (ref >60)
Glucose, Bld: 459 mg/dL — ABNORMAL HIGH (ref 65–99)
POTASSIUM: 4.5 mmol/L (ref 3.5–5.1)
Sodium: 129 mmol/L — ABNORMAL LOW (ref 135–145)

## 2017-01-13 LAB — URINALYSIS, ROUTINE W REFLEX MICROSCOPIC
Bacteria, UA: NONE SEEN
Bilirubin Urine: NEGATIVE
Glucose, UA: >=500 mg/dL — AB
Ketones, ur: NEGATIVE mg/dL
Nitrite: NEGATIVE
Protein, ur: NEGATIVE mg/dL
Specific Gravity, Urine: 1.013 (ref 1.005–1.030)
Squamous Epithelial / LPF: NONE SEEN
pH: 5 (ref 5.0–8.0)

## 2017-01-13 LAB — BRAIN NATRIURETIC PEPTIDE: B NATRIURETIC PEPTIDE 5: 15 pg/mL (ref 0.0–100.0)

## 2017-01-13 LAB — I-STAT TROPONIN, ED: Troponin i, poc: 0 ng/mL (ref 0.00–0.08)

## 2017-01-13 LAB — I-STAT CG4 LACTIC ACID, ED: Lactic Acid, Venous: 1.86 mmol/L (ref 0.5–1.9)

## 2017-01-13 LAB — TSH: TSH: 0.788 u[IU]/mL (ref 0.350–4.500)

## 2017-01-13 MED ORDER — ENOXAPARIN SODIUM 30 MG/0.3ML ~~LOC~~ SOLN
30.0000 mg | SUBCUTANEOUS | Status: DC
  Administered 2017-01-13 – 2017-01-15 (×3): 30 mg via SUBCUTANEOUS
  Filled 2017-01-13 (×3): qty 0.3

## 2017-01-13 MED ORDER — SODIUM CHLORIDE 0.9% FLUSH
3.0000 mL | Freq: Two times a day (BID) | INTRAVENOUS | Status: DC
  Administered 2017-01-13 – 2017-01-16 (×6): 3 mL via INTRAVENOUS

## 2017-01-13 MED ORDER — SODIUM CHLORIDE 0.9 % IV BOLUS (SEPSIS)
1000.0000 mL | Freq: Once | INTRAVENOUS | Status: AC
  Administered 2017-01-13: 1000 mL via INTRAVENOUS

## 2017-01-13 MED ORDER — ACETAMINOPHEN 325 MG PO TABS
650.0000 mg | ORAL_TABLET | Freq: Four times a day (QID) | ORAL | Status: DC | PRN
Start: 2017-01-13 — End: 2017-01-16

## 2017-01-13 MED ORDER — SODIUM CHLORIDE 0.9 % IV SOLN
INTRAVENOUS | Status: AC
  Administered 2017-01-13: 1000 mL via INTRAVENOUS

## 2017-01-13 MED ORDER — ACETAMINOPHEN 650 MG RE SUPP
650.0000 mg | Freq: Four times a day (QID) | RECTAL | Status: DC | PRN
Start: 2017-01-13 — End: 2017-01-16

## 2017-01-13 MED ORDER — INSULIN ASPART 100 UNIT/ML ~~LOC~~ SOLN
6.0000 [IU] | Freq: Once | SUBCUTANEOUS | Status: AC
  Administered 2017-01-13: 6 [IU] via INTRAVENOUS
  Filled 2017-01-13: qty 1

## 2017-01-13 MED ORDER — PRO-STAT SUGAR FREE PO LIQD
30.0000 mL | Freq: Three times a day (TID) | ORAL | Status: DC
  Administered 2017-01-14 – 2017-01-15 (×5): 30 mL via ORAL
  Filled 2017-01-13 (×4): qty 30

## 2017-01-13 MED ORDER — ASPIRIN EC 81 MG PO TBEC
81.0000 mg | DELAYED_RELEASE_TABLET | Freq: Every day | ORAL | Status: DC
  Administered 2017-01-14 – 2017-01-16 (×3): 81 mg via ORAL
  Filled 2017-01-13 (×4): qty 1

## 2017-01-13 NOTE — ED Provider Notes (Signed)
[facility] DEPT Provider Note   CSN: [id number] Arrival date & time:        History   Chief Complaint Chief Complaint  Patient presents with  . Loss of Consciousness    HPI Brian Owen is a 66 y.o. male.  HPI Patient presents to the emergency department with syncopal so that occurred earlier today.  The patient states that he was in the bathroom when he passed out.  EMS was called and found that his blood pressure is 92 systolically standing.  Patient states that he has had similar episodes in the past.  He states that nothing quite a while.  Patient states that he has not have any other symptoms at this timeThe patient denies chest pain, shortness of breath, headache,blurred vision, neck pain, fever, cough, weakness, numbness, dizziness, anorexia, edema, abdominal pain, nausea, vomiting, diarrhea, rash, back pain, dysuria, hematemesis, bloody stool.  Past Medical History:  Diagnosis Date  . Bullae 11/11/2015   legs/notes 11/11/2015  . Chronic kidney disease   . Heavy cigarette smoker   . Parasite infection 2016   "couldn't afford to have surgery done; just left it; lower legs"  . Uncontrolled type II diabetes mellitus ([place]) dx'd 2013    Patient Active Problem List   Diagnosis Date Noted  . UTI (urinary tract infection) 08/27/2016  . CAP (community acquired pneumonia) 08/27/2016  . Orthostatic hypotension 06/17/2016  . Hypoglycemia 06/16/2016  . CKD (chronic kidney disease) 06/16/2016  . DM type 2 (diabetes mellitus, type 2) ([place]) 06/16/2016  . Underweight 01/17/2016  . Pruritic rash 12/20/2015  . Stage IV skin ulcer ([place]) 12/05/2015  . Autonomic neuropathy due to diabetes ([address]) 11/29/2015  . C. difficile diarrhea 11/29/2015  . Bullae 11/29/2015  . Hyperglycemia 11/27/2015  . Vertigo 11/27/2015  . Orthostatic hypotension 11/27/2015  . Abnormal thyroid function test 11/15/2015  . Weakness 11/11/2015  . SVT (supraventricular tachycardia) ([place]) 11/11/2015  . Diarrhea  08/17/2014  . Protein-calorie malnutrition, severe ([place]) 07/26/2014  . Dizziness 07/22/2014  . History of tobacco use 06/27/2014  . Edentulism, complete 06/27/2014  . Language barrier to communication 06/27/2014  . HTN (hypertension) 06/27/2014  . Normocytic anemia 05/14/2014  . Diabetes mellitus type 2, uncontrolled ([place]) 05/13/2014    Past Surgical History:  Procedure Laterality Date  . NO PAST SURGERIES         Home Medications    Prior to Admission medications   Medication Sig Start Date End Date Taking? Authorizing Provider  ACCU-CHEK SOFTCLIX LANCETS lancets 1 each by Other route 3 (three) times daily. E11.65 Patient not taking: Reported on 08/27/2016 12/05/15   Boykin Nearing, MD  Amino Acids-Protein Hydrolys (FEEDING SUPPLEMENT, PRO-STAT SUGAR FREE 64,) LIQD Take 30 mLs by mouth 3 (three) times daily with meals. Patient not taking: Reported on 08/27/2016 11/13/15   Thurnell Lose, MD  aspirin EC 81 MG EC tablet Take 1 tablet (81 mg total) by mouth daily. Patient not taking: Reported on 08/27/2016 11/29/15   Janece Canterbury, MD  Blood Glucose Monitoring Suppl (ACCU-CHEK AVIVA PLUS) w/Device KIT 1 Device by Does not apply route 3 (three) times daily after meals. E11.65 Patient not taking: Reported on 08/27/2016 12/05/15   Boykin Nearing, MD  camphor-menthol [facility]) lotion Apply 1 application topically as needed for itching. Patient not taking: Reported on 08/27/2016 02/21/16   Adriana Mccallum [name], MD  glucose blood (ACCU-CHEK AVIVA PLUS) test strip 1 each by Other route 3 (three) times daily. E11.65 Patient not taking: Reported on 01/13/2017 12/05/15  Boykin Nearing, MD  insulin aspart protamine- aspart (NOVOLOG MIX 70/30) (70-30) 100 UNIT/ML injection Inject 0.08 mLs (8 Units total) into the skin 2 (two) times daily with a meal. Patient not taking: Reported on 01/13/2017 08/31/16   Boykin Nearing, MD  Insulin Syringe-Needle U-100 (B-D INSULIN SYRINGE 1CC/25GX1") 25G X 1" 1 ML MISC  Use as directed Patient not taking: Reported on 08/27/2016 07/13/16   Boykin Nearing, MD    Family History Family History  Problem Relation Age of Onset  . Diabetes Other   . Diabetes      Social History Social History  Substance Use Topics  . Smoking status: Former Smoker    Packs/day: 1.50    Years: 48.00    Types: Cigarettes    Quit date: 2015  . Smokeless tobacco: Never Used  . Alcohol use No     Allergies   Patient has no known allergies.   Review of Systems Review of Systems All other systems negative except as documented in the HPI. All pertinent positives and negatives as reviewed in the HPI.  Physical Exam Updated Vital Signs BP (!) 71/56   Pulse (!) 102   Temp 98.3 F (36.8 C) (Oral)   Resp (!) 21   SpO2 93%   Physical Exam  Constitutional: He is oriented to person, place, and time. He appears well-developed and well-nourished. No distress.  HENT:  Head: Normocephalic and atraumatic.  Mouth/Throat: Oropharynx is clear and moist.  Eyes: Pupils are equal, round, and reactive to light.  Neck: Normal range of motion. Neck supple.  Cardiovascular: Normal rate, regular rhythm and normal heart sounds.  Exam reveals no gallop and no friction rub.   No murmur heard. Pulmonary/Chest: Effort normal and breath sounds normal. No respiratory distress. He has no wheezes.  Abdominal: Soft. Bowel sounds are normal. He exhibits no distension. There is no tenderness.  Neurological: He is alert and oriented to person, place, and time. He exhibits normal muscle tone. Coordination normal.  Skin: Skin is warm and dry. Capillary refill takes less than 2 seconds. No rash noted. No erythema.  Psychiatric: He has a normal mood and affect. His behavior is normal.  Nursing note and vitals reviewed.    ED Treatments / Results  Labs (all labs ordered are listed, but only abnormal results are displayed) Labs Reviewed  BASIC METABOLIC PANEL - Abnormal; Notable for the  following:       Result Value   Sodium 129 (*)    Chloride 97 (*)    Glucose, Bld 459 (*)    Creatinine, Ser 2.24 (*)    GFR calc non Af Amer 29 (*)    GFR calc Af Amer 33 (*)    All other components within normal limits  URINALYSIS, ROUTINE W REFLEX MICROSCOPIC - Abnormal; Notable for the following:    APPearance HAZY (*)    Glucose, UA >=500 (*)    Hgb urine dipstick SMALL (*)    Leukocytes, UA MODERATE (*)    All other components within normal limits  CBC WITH DIFFERENTIAL/PLATELET  I-STAT TROPOININ, ED  I-STAT CG4 LACTIC ACID, ED    EKG  EKG Interpretation None       Radiology Dg Chest 2 View  Result Date: 01/13/2017 CLINICAL DATA:  Syncope. EXAM: CHEST  2 VIEW COMPARISON:  08/28/2016. FINDINGS: Normal sized heart. Clear lungs. Aortic arch calcifications. Unremarkable bones. IMPRESSION: No acute abnormality.  Aortic atherosclerosis.  This Electronically Signed   By: Percell Locus.D.  On: 01/13/2017 13:39    Procedures Procedures (including critical care time)  Medications Ordered in ED Medications  sodium chloride 0.9 % bolus 1,000 mL (1,000 mLs Intravenous New Bag/Given 01/13/17 1509)     Initial Impression / Assessment and Plan / ED Course  I have reviewed the triage vital signs and the nursing notes.  Pertinent labs & imaging results that were available during my care of the patient were reviewed by me and considered in my medical decision making (see chart for details).     Patient needed admission to the hospital for his orthostatic hypotension.  Patient has been otherwise stable emergency department.  Advised of the plan and all questions were answered  Final Clinical Impressions(s) / ED Diagnoses   Final diagnoses:  None    New Prescriptions New Prescriptions   No medications on file     Dalia Heading, PA-C 01/13/17 [place], MD 01/16/17 1317

## 2017-01-13 NOTE — ED Triage Notes (Signed)
Pt arrives from home for Syncope event and dizziness with standing. BP 92/50.

## 2017-01-13 NOTE — ED Notes (Signed)
Granddaughter: [phone]

## 2017-01-13 NOTE — H&P (Signed)
[facility] Admission History and Physical Service Pager: [phone]  Patient name: Brian Owen Medical record number: [id number] Date of birth: July 12, 1951 Age: 66 y.o. Gender: male  Primary Care Provider: [name], MD Consultants: None Code Status: FULL  Chief Complaint: Syncope  Assessment and Plan: Brian Owen is a 66 y.o. male presenting with syncope at home, as well as hyperglycemia. PMH is significant for T2DM, orthostatic hypotension, CKDIII.   Syncope: Hx of similar episodes with multiple admissions for such where hydration and control of CBGs were recommended. Possible mechanical fall given additional history from family (may have slipped on urine on the floor). Echo in 05/2016 with EF 55-60% and normal wall motion. Hx inconsistent with seizures, no shaking observed nor loss of bowel or bladder. No reported vomiting/diarrhea, however >500 glucose on UA, with glucosuria likely contributing to dehydration. Increased Cr also consistent with dehydration. CT head NAICA with chronic sinusitis. Orthostatic vitals positive in ED. Currently receiving second NS liter bolus with improvement already noted in BP.  -Observe on tele unit, Dr. Gwendlyn Owen attending -EKG in AM -Echo -Hgb A1C -BNP -Carotid US -TSH -Repeat orthostatics after hydration -Urine culture, added on given hx of Enterococcus UTIs  T2DM: CBG 459 on admission. Based on chart review, several episodes of hypoglycemia in 2017. Will be cautious with insulin based on these episodes. Home insulin was 8U 70/30 BID, but reportedly has not taken insulin in five months. Most recent HgbA1c was 8.8 in 07/2016. BMP with hyponatremia, but corrects to normal with hyperglycemia. -6U Novolog now given hyperglycemia on admission -CBGs AC QHS -No long acting insulin as patient has been out of this for months  Acute on Chronic Kidney Disease: Baseline Cr 1.3-1.4. Cr on presentation 2.2, and patient appearing  clinically dehydrated. S/p 2L NS boluses. Will obtain PVR for possible retention as a source.   -BMP in am -Consider FeNa if Cr does not normalize with hydration -Post void residual   Leg ulcers: patient reports history of similar ulcers for 2 years. Saw PCP for these in the past with wound culture with MSSA and s/p course of bactrim. Followed with wound care in the past. Per chart review, these began as bullae. HIV last NR 07/2016. Differential includes venous insufficiency, pyoderma gangrenosum, additional etiologies likely such as cutaneous amebiasis or bacillary angiomatosis (as patient is from [place]) could be considered if HIV positive. -Wound care consult -HIV as differential broadens with immunosuppression  Severe protein calorie malnutrition: Patient reports he only eats 2 meals per day. Family reports they have adequate food at home. Family also reports difficulty walking at home for >1 year. -CSW consult -PT/OT eval   FEN/GI: carb modified diet Prophylaxis: lovenox  Disposition: observe on tele  History of Present Illness:  Brian Owen is a 66 y.o. male presenting with a syncopal event at home where wife found him after falling down after going to the bathroom. Granddaughter reports that he has trouble walking (becomes short of breath with exertion and has to sit down). This morning, he urinated on the floor by accident in the bathroom and slipped on this, hitting his head on the wall. No shaking movements, denied prodrome. Has been feeling well and family reports his normal self for the last few days. Ran out of insulin about 5 months ago. Family reports he is forgetful at home, memory loss developed slowly. Can perform all of his ADLs at home. Notes a headache, which he says has been present for several months.  Called EMS due to fall. EMS found SBP 92.   Additionally, he has two large lesions on his left lower leg x 2 months. He has had similar ones for about 2 years. Has been  treated with the [facility] care center before. Says he ran out of the cream at home. Denies fevers. Slow change to the ulcers. Not painful.   In ED, CT head NAICA. Orthostatics positive with SBP in 160s and 71 upon standing with HR increase from 85 to 101. S/p 1L NS bolus. BMP with hyperglycemia, hyponatremia which corrects.  Review Of Systems: Per HPI with the following additions:  No cough, runny nose, myalgias, fevers, chest pain.  ROS  Patient Active Problem List   Diagnosis Date Noted  . Orthostatic hypotension 06/17/2016  . CKD (chronic kidney disease) 06/16/2016  . Underweight 01/17/2016  . Stage IV skin ulcer (Brian Owen) 12/05/2015  . Autonomic neuropathy due to diabetes (Brian Owen) 11/29/2015  . C. difficile diarrhea 11/29/2015  . Vertigo 11/27/2015  . Orthostatic hypotension 11/27/2015  . SVT (supraventricular tachycardia) (Brian Owen) 11/11/2015  . Protein-calorie malnutrition, severe (Brian Owen) 07/26/2014  . History of tobacco use 06/27/2014  . Edentulism, complete 06/27/2014  . Language barrier to communication 06/27/2014  . HTN (hypertension) 06/27/2014  . Diabetes mellitus type 2, uncontrolled (Brian Owen) 05/13/2014    Past Medical History: Past Medical History:  Diagnosis Date  . Bullae 11/11/2015   legs/notes 11/11/2015  . Chronic kidney disease   . Heavy cigarette smoker   . Parasite infection 2016   "couldn't afford to have surgery done; just left it; lower legs"  . Uncontrolled type II diabetes mellitus ([place]) dx'd 2013    Past Surgical History: Past Surgical History:  Procedure Laterality Date  . NO PAST SURGERIES      Social History: Social History  Substance Use Topics  . Smoking status: Former Smoker    Packs/day: 1.50    Years: 48.00    Types: Cigarettes    Quit date: 2015  . Smokeless tobacco: Never Used  . Alcohol use No   Additional social history: lives with many family members  Please also refer to relevant sections of EMR.  Family History: Family History   Problem Relation Age of Onset  . Diabetes Other   . Diabetes      Allergies and Medications: No Known Allergies No current facility-administered medications on file prior to encounter.    Current Outpatient Prescriptions on File Prior to Encounter  Medication Sig Dispense Refill  . ACCU-CHEK SOFTCLIX LANCETS lancets 1 each by Other route 3 (three) times daily. E11.65 (Patient not taking: Reported on 08/27/2016) 100 each 12  . Amino Acids-Protein Hydrolys (FEEDING SUPPLEMENT, PRO-STAT SUGAR FREE 64,) LIQD Take 30 mLs by mouth 3 (three) times daily with meals. (Patient not taking: Reported on 08/27/2016) 900 mL 0  . aspirin EC 81 MG EC tablet Take 1 tablet (81 mg total) by mouth daily. (Patient not taking: Reported on 08/27/2016) 90 tablet 0  . Blood Glucose Monitoring Suppl (ACCU-CHEK AVIVA PLUS) w/Device KIT 1 Device by Does not apply route 3 (three) times daily after meals. E11.65 (Patient not taking: Reported on 08/27/2016) 1 kit 0  . camphor-menthol (SARNA) lotion Apply 1 application topically as needed for itching. (Patient not taking: Reported on 08/27/2016) 222 mL 5  . glucose blood (ACCU-CHEK AVIVA PLUS) test strip 1 each by Other route 3 (three) times daily. E11.65 (Patient not taking: Reported on 01/13/2017) 100 each 12  . insulin aspart protamine- aspart (NOVOLOG  MIX 70/30) (70-30) 100 UNIT/ML injection Inject 0.08 mLs (8 Units total) into the skin 2 (two) times daily with a meal. (Patient not taking: Reported on 01/13/2017) 10 mL 0  . Insulin Syringe-Needle U-100 (B-D INSULIN SYRINGE 1CC/25GX1") 25G X 1" 1 ML MISC Use as directed (Patient not taking: Reported on 08/27/2016) 100 each 0    Objective: BP (!) 143/80 (BP Location: Right Arm)   Pulse 95   Temp 98.6 F (37 C) (Oral)   Resp 18   SpO2 100%  Exam: General: Frail elderly male resting in bed in NAD. Eyes: EOMI, PEERLA ENTM: MMM, oropharnyx normal Neck: supple, no JVD Cardiovascular: RRR, no murmur Respiratory: CTAB,  easy WOB, no wheezes Gastrointestinal: SNTND, +BS  MSK: moves all extremities spontaneously, minimal muscle tone Derm: 2 large LLE wounds as pictured - nontender, no discharge     Neuro: CN II-XII grossly intact, AO x4 (question language barrier, date took multiple attempts) Psych: mood and affect appropriate  Labs and Imaging: CBC BMET   Recent Labs Lab 01/13/17 1200  WBC 7.1  HGB 13.0  HCT 39.2  PLT 176    Recent Labs Lab 01/13/17 1202  NA 129*  K 4.5  CL 97*  CO2 22  BUN 20  CREATININE 2.24*  GLUCOSE 459*  CALCIUM 9.5     Trop 0.00 Lactic acid 1.86  Verner Mould, MD 01/13/2017, 8:03 PM PGY-1, [place] Intern pager: [phone], text pages welcome  UPPER LEVEL ADDENDUM  I have read the above note and made revisions highlighted in orange.  Adin Hector, MD, MPH PGY-2 [facility] Pager [phone]

## 2017-01-13 NOTE — Progress Notes (Signed)
Received report from [place], ED RN . Patient being admitted d/t syncopal episode which resulted in a fall with LOC.  According to nurse, pt may have slipped in puddle of urine and then fell.. Awaiting patient to room 26.

## 2017-01-13 NOTE — Progress Notes (Addendum)
Called and talked to granddaugther and daughter, Clois Dupes. She would like someone to call her on [phone] and let her know what floor the patient is moving to.

## 2017-01-14 ENCOUNTER — Observation Stay (HOSPITAL_BASED_OUTPATIENT_CLINIC_OR_DEPARTMENT_OTHER): Payer: Medicare Other

## 2017-01-14 DIAGNOSIS — E11622 Type 2 diabetes mellitus with other skin ulcer: Secondary | ICD-10-CM | POA: Diagnosis not present

## 2017-01-14 DIAGNOSIS — L97909 Non-pressure chronic ulcer of unspecified part of unspecified lower leg with unspecified severity: Secondary | ICD-10-CM | POA: Diagnosis not present

## 2017-01-14 DIAGNOSIS — R55 Syncope and collapse: Secondary | ICD-10-CM | POA: Diagnosis not present

## 2017-01-14 DIAGNOSIS — E1165 Type 2 diabetes mellitus with hyperglycemia: Secondary | ICD-10-CM | POA: Diagnosis not present

## 2017-01-14 DIAGNOSIS — E1143 Type 2 diabetes mellitus with diabetic autonomic (poly)neuropathy: Secondary | ICD-10-CM | POA: Diagnosis not present

## 2017-01-14 DIAGNOSIS — E43 Unspecified severe protein-calorie malnutrition: Secondary | ICD-10-CM | POA: Diagnosis not present

## 2017-01-14 DIAGNOSIS — E871 Hypo-osmolality and hyponatremia: Secondary | ICD-10-CM | POA: Diagnosis not present

## 2017-01-14 DIAGNOSIS — Z794 Long term (current) use of insulin: Secondary | ICD-10-CM | POA: Diagnosis not present

## 2017-01-14 DIAGNOSIS — E118 Type 2 diabetes mellitus with unspecified complications: Secondary | ICD-10-CM | POA: Diagnosis not present

## 2017-01-14 DIAGNOSIS — E860 Dehydration: Secondary | ICD-10-CM | POA: Diagnosis not present

## 2017-01-14 DIAGNOSIS — N183 Chronic kidney disease, stage 3 (moderate): Secondary | ICD-10-CM | POA: Diagnosis not present

## 2017-01-14 DIAGNOSIS — N390 Urinary tract infection, site not specified: Secondary | ICD-10-CM | POA: Diagnosis not present

## 2017-01-14 DIAGNOSIS — E1122 Type 2 diabetes mellitus with diabetic chronic kidney disease: Secondary | ICD-10-CM | POA: Diagnosis not present

## 2017-01-14 DIAGNOSIS — N179 Acute kidney failure, unspecified: Secondary | ICD-10-CM | POA: Diagnosis not present

## 2017-01-14 DIAGNOSIS — I129 Hypertensive chronic kidney disease with stage 1 through stage 4 chronic kidney disease, or unspecified chronic kidney disease: Secondary | ICD-10-CM | POA: Diagnosis not present

## 2017-01-14 DIAGNOSIS — I951 Orthostatic hypotension: Secondary | ICD-10-CM | POA: Diagnosis not present

## 2017-01-14 LAB — HEMOGLOBIN A1C
HEMOGLOBIN A1C: 15 % — AB (ref 4.8–5.6)
Mean Plasma Glucose: 384 mg/dL

## 2017-01-14 LAB — CBC
HCT: 30.7 % — ABNORMAL LOW (ref 39.0–52.0)
Hemoglobin: 10.2 g/dL — ABNORMAL LOW (ref 13.0–17.0)
MCH: 29.5 pg (ref 26.0–34.0)
MCHC: 33.2 g/dL (ref 30.0–36.0)
MCV: 88.7 fL (ref 78.0–100.0)
PLATELETS: 160 10*3/uL (ref 150–400)
RBC: 3.46 MIL/uL — ABNORMAL LOW (ref 4.22–5.81)
RDW: 13.7 % (ref 11.5–15.5)
WBC: 6.2 10*3/uL (ref 4.0–10.5)

## 2017-01-14 LAB — ALBUMIN: Albumin: 3.1 g/dL — ABNORMAL LOW (ref 3.5–5.0)

## 2017-01-14 LAB — VAS US CAROTID
LCCAPDIAS: 11 cm/s
LEFT ECA DIAS: -13 cm/s
LICADDIAS: -24 cm/s
LICADSYS: -86 cm/s
LICAPSYS: -102 cm/s
Left CCA prox sys: 108 cm/s
Left ICA prox dias: -24 cm/s
RCCAPDIAS: 17 cm/s
RCCAPSYS: 97 cm/s
RIGHT ECA DIAS: -14 cm/s
RIGHT VERTEBRAL DIAS: -17 cm/s
Right cca dist sys: -133 cm/s

## 2017-01-14 LAB — BASIC METABOLIC PANEL
Anion gap: 5 (ref 5–15)
BUN: 20 mg/dL (ref 6–20)
CALCIUM: 8.8 mg/dL — AB (ref 8.9–10.3)
CO2: 23 mmol/L (ref 22–32)
CREATININE: 1.73 mg/dL — AB (ref 0.61–1.24)
Chloride: 108 mmol/L (ref 101–111)
GFR calc Af Amer: 46 mL/min — ABNORMAL LOW (ref >60)
GFR calc non Af Amer: 39 mL/min — ABNORMAL LOW (ref >60)
GLUCOSE: 345 mg/dL — AB (ref 65–99)
Potassium: 4.2 mmol/L (ref 3.5–5.1)
Sodium: 136 mmol/L (ref 135–145)

## 2017-01-14 LAB — GLUCOSE, CAPILLARY
GLUCOSE-CAPILLARY: 204 mg/dL — AB (ref 65–99)
Glucose-Capillary: 293 mg/dL — ABNORMAL HIGH (ref 65–99)
Glucose-Capillary: 528 mg/dL (ref 65–99)
Glucose-Capillary: 136 mg/dL — ABNORMAL HIGH (ref 65–99)

## 2017-01-14 LAB — HIV ANTIBODY (ROUTINE TESTING W REFLEX): HIV SCREEN 4TH GENERATION: NONREACTIVE

## 2017-01-14 LAB — ECHOCARDIOGRAM COMPLETE
HEIGHTINCHES: 64 in
WEIGHTICAEL: 1660.8 [oz_av]

## 2017-01-14 MED ORDER — INSULIN ASPART 100 UNIT/ML ~~LOC~~ SOLN
0.0000 [IU] | Freq: Three times a day (TID) | SUBCUTANEOUS | Status: DC
  Administered 2017-01-14 – 2017-01-15 (×2): 3 [IU] via SUBCUTANEOUS
  Administered 2017-01-15: 2 [IU] via SUBCUTANEOUS
  Administered 2017-01-15: 1 [IU] via SUBCUTANEOUS
  Administered 2017-01-16: 7 [IU] via SUBCUTANEOUS
  Administered 2017-01-16: 2 [IU] via SUBCUTANEOUS
  Administered 2017-01-16: 5 [IU] via SUBCUTANEOUS

## 2017-01-14 MED ORDER — INSULIN ASPART 100 UNIT/ML ~~LOC~~ SOLN
10.0000 [IU] | Freq: Once | SUBCUTANEOUS | Status: AC
Start: 2017-01-14 — End: 2017-01-14
  Administered 2017-01-14: 10 [IU] via SUBCUTANEOUS

## 2017-01-14 MED ORDER — INSULIN GLARGINE 100 UNIT/ML ~~LOC~~ SOLN
10.0000 [IU] | Freq: Every day | SUBCUTANEOUS | Status: DC
  Administered 2017-01-14 – 2017-01-15 (×2): 10 [IU] via SUBCUTANEOUS
  Filled 2017-01-14 (×2): qty 0.1

## 2017-01-14 NOTE — Progress Notes (Addendum)
Occupational Therapy Evaluation Patient Details Name: Brian Owen MRN: [id number] DOB: 31-May-1951 Today's Date: 01/14/2017    History of Present Illness 66 Y/O M with PMX of DM2, Chronic tobacco smoker, CKD, HTN, orthostatic hypotension presented with dizziness and fall.   Clinical Impression   PTA, pt reports independence with ADL and functional mobility but reports unsteadiness and "furniture walking" at home. He currently requires overall min guard assist for ADL and up to min assist at times with toilet transfers for safety. Feel pt would benefit from OT services while admitted to improve independence with ADL and functional mobility. Additionally recommend HHOT post-acute D/C to maximize return to PLOF. OT will continue to follow acutely. Recommend 3-in-1 BSC for DME needs.  See PT note and vitals flowsheet for details of orthostatic BP measurements.     Follow Up Recommendations  Home health OT;Supervision/Assistance - 24 hour    Equipment Recommendations  3 in 1 bedside commode    Recommendations for Other Services       Precautions / Restrictions Precautions Precautions: Fall Restrictions Weight Bearing Restrictions: No      Mobility Bed Mobility Overal bed mobility: Modified Independent             General bed mobility comments: no A required  Transfers Overall transfer level: Needs assistance   Transfers: Sit to/from Stand Sit to Stand: Supervision         General transfer comment: Supervision for safety.    Balance Overall balance assessment: Needs assistance Sitting-balance support: No upper extremity supported;Feet supported Sitting balance-Leahy Scale: Good     Standing balance support: Single extremity supported;No upper extremity supported;During functional activity Standing balance-Leahy Scale: Fair Standing balance comment: fair static, poor dynamic                            ADL Overall ADL's : Needs  assistance/impaired Eating/Feeding: Set up;Sitting Eating/Feeding Details (indicate cue type and reason): Requires encouragement to eat. Grooming: Wash/dry face;Min guard;Standing Grooming Details (indicate cue type and reason): Min guard assist for balance. Upper Body Bathing: Supervision/ safety;Sitting   Lower Body Bathing: Min guard;Sit to/from stand   Upper Body Dressing : Supervision/safety;Sitting   Lower Body Dressing: Min guard;Sit to/from stand   Toilet Transfer: Ambulation;BSC;Minimal assistance Toilet Transfer Details (indicate cue type and reason): Unsteady with ambulation and reaching for furniture throughout.  Toileting- Water quality scientist and Hygiene: Min guard;Sit to/from stand       Functional mobility during ADLs: Min guard;Minimal assistance General ADL Comments: Unsteady without RW. Pt is interested in [facility] for home for improved safety.     Vision Baseline Vision/History: No visual deficits Patient Visual Report: No change from baseline Vision Assessment?: No apparent visual deficits     Perception     Praxis      Pertinent Vitals/Pain Pain Assessment: No/denies pain     Hand Dominance Right   Extremity/Trunk Assessment Upper Extremity Assessment Upper Extremity Assessment: Generalized weakness   Lower Extremity Assessment Lower Extremity Assessment: Generalized weakness       Communication Communication Communication: Prefers language other than English;Interpreter utilized ([place]; [place] from [employer] interpreting)   Cognition Arousal/Alertness: Awake/alert Behavior During Therapy: WFL for tasks assessed/performed Overall Cognitive Status: Within Functional Limits for tasks assessed                     General Comments  wound on L knee. Scars from other old wounds.  Pt does  not know what is causing them    Exercises       Shoulder Instructions      Home Living Family/patient expects to be discharged to::  Private residence Living Arrangements: Spouse/significant other Available Help at Discharge: Family;Available 24 hours/day Type of Home: House Home Access: Level entry     Home Layout: One level     Bathroom Shower/Tub: Teacher, early years/pre: Standard     Home Equipment: None   Additional Comments: Interpreter Dominica Severin (from [employer]) used with [place] language      Prior Functioning/Environment Level of Independence: Independent with assistive device(s)  Gait / Transfers Assistance Needed: [place].  Used to use cane, but is not now. Requesting RW. Per old PT notes he has used a RW in the past.     Comments: Reports using commode "that is on the floor" as he feels unsafe with his commode at home. He reports that he completes basic ADL independently. He does not drive.        OT Problem List: Decreased strength;Decreased activity tolerance;Impaired balance (sitting and/or standing);Decreased safety awareness;Decreased knowledge of use of DME or AE      OT Treatment/Interventions: Self-care/ADL training;Therapeutic exercise;DME and/or AE instruction;Energy conservation;Balance training;Patient/family education    OT Goals(Current goals can be found in the care plan section) Acute Rehab OT Goals Patient Stated Goal: get a walker to help him at home OT Goal Formulation: With patient Time For Goal Achievement: 01/28/17 Potential to Achieve Goals: Good ADL Goals Pt Will Perform Grooming: with modified independence;standing Pt Will Transfer to Toilet: with modified independence;ambulating;bedside commode (BSC over toilet) Pt Will Perform Toileting - Clothing Manipulation and hygiene: with modified independence;sit to/from stand Pt Will Perform Tub/Shower Transfer: with modified independence;Tub transfer;3 in 1;rolling walker Additional ADL Goal #1: Pt will independently identify 3 fall prevention strategies to incorporate into daily routine.  OT  Frequency: Min 1X/week   Barriers to D/C:            Co-evaluation PT/OT/SLP Co-Evaluation/Treatment: Yes Reason for Co-Treatment: For patient/therapist safety PT goals addressed during session: Mobility/safety with mobility OT goals addressed during session: ADL's and self-care      End of Session Equipment Utilized During Treatment: Gait belt Nurse Communication: Mobility status  Activity Tolerance: Patient tolerated treatment well Patient left: in chair;with call bell/phone within reach;with chair alarm set  OT Visit Diagnosis: Unsteadiness on feet (R26.81)                ADL either performed or assessed with clinical judgement  Time: 6314-9702 OT Time Calculation (min): 36 min Charges:  OT General Charges $OT Visit: 1 Procedure OT Evaluation $OT Eval Moderate Complexity: 1 Procedure G-Codes: OT G-codes **NOT FOR INPATIENT CLASS** Functional Assessment Tool Used: AM-PAC 6 Clicks Daily Activity Functional Limitation: Self care Self Care Current Status (O3785): At least 20 percent but less than 40 percent impaired, limited or restricted Self Care Goal Status (Y8502): At least 1 percent but less than 20 percent impaired, limited or restricted   Norman Herrlich, [place] OTR/L  Pager: [phone] 01/14/2017, 10:07 AM

## 2017-01-14 NOTE — Progress Notes (Signed)
*  PRELIMINARY RESULTS* Vascular Ultrasound Carotid Duplex (Doppler) has been completed.  Preliminary findings: Findings consistent with a 1-39 percent stenosis involving the right internal carotid artery and the left internal carotid artery.  Trace plaque vs intimal thickening bilaterally.  Brian Owen 01/14/2017, 10:39 AM

## 2017-01-14 NOTE — Progress Notes (Signed)
Family Medicine Teaching Service Daily Progress Note Intern Pager: [phone]  Patient name: Brian Owen Medical record number: [id number] Date of birth: 03-Dec-1950 Age: 66 y.o. Gender: male  Primary Care Provider: [name], MD Consultants: None Code Status: FULL   [name]:  Admit 3/18   Assessment and Plan: Brian Owen is a 66 y.o. male presenting with syncope at home, as well as hyperglycemia. PMH is significant for T2DM, orthostatic hypotension, CKDIII.   Syncope: Hx of similar episodes with multiple admissions for such where hydration and control of CBGs were recommended. Possible mechanical fall given additional history from family (may have slipped on urine on the floor). Echo in 05/2016 with EF 55-60% and normal wall motion. BNP wnl, 15.  EKG without acute ischemic changes. CT head NAICA with chronic sinusitis. Orthostatic vitals positive in ED.  Repeat orthostatics after hydration were performed and patient exhibiting orthostatic hypotension. TSH 0.78 wnl.  A1c 15%.  Echo with EF 60-65%.   -Carotid US pending  -Urine culture pending (added on given hx of Enterococcus UTIs)  -AM cortisol in setting of continued orthostatic hypotension  -Fall precautions   T2DM: CBG 459 on admission with normal anion gap. Based on chart review, several episodes of hypoglycemia in 2017.  Home insulin was 8U 70/30 BID, but reportedly has not taken insulin in five months. A1c 15% on admission.  -CBGs AC QHS - 974,163  -added sensitive SSI and 10 U Lantus daily for elevated blood sugars    -Consult to diabetic coordinator for education   Acute on Chronic Kidney Disease: Baseline Cr 1.3-1.4. Cr on presentation 2.2, and patient appearing clinically dehydrated. S/p 2L NS boluses. - Cr this AM improved to 1.73 -Monitor BMET   Leg ulcers: patient reports history of similar ulcers for 2 years. Saw PCP for these in the past with wound culture with MSSA and s/p course of  bactrim. Followed with wound care in the past. Per chart review, these began as bullae. HIV last NR 07/2016. Differential includes venous insufficiency, pyoderma gangrenosum, additional etiologies likely such as cutaneous amebiasis or bacillary angiomatosis (as patient is from [place]) could be considered if HIV positive. -Wound care consult -ID consult for ?history of parasitic infection per chart review which he did not get surgery for -HIV pending   Severe protein calorie malnutrition: Patient reports he only eats 2 meals per day. Family reports they have adequate food at home. Family also reports difficulty walking at home for >1 year. Most recent albumin low (2.9) on  08/28/2016 likely c/w malnutrition.  -Consult nutrition  -Repeat albumin pending  -PT/OT eval   FEN/GI: carb modified diet, NS @100cc .hr Prophylaxis: lovenox  Disposition: Dispo pending clinical improvement   Subjective:  [name].  [place] interpreter used to communicate.  Patient reports he feels better this morning.  Denies dizziness.  About to go down with tech for echocardiogram.   Objective: Temp:  [98.3 F (36.8 C)-99.1 F (37.3 C)] 99.1 F (37.3 C) (03/19 0610) Pulse Rate:  [86-102] 87 (03/19 0610) Resp:  [15-29] 18 (03/19 0610) BP: (71-171)/(56-98) 117/64 (03/19 0610) SpO2:  [93 %-100 %] 97 % (03/19 0610) Weight:  [103 lb 12.8 oz (47.1 kg)] 103 lb 12.8 oz (47.1 kg) (03/18 2205)   Physical Exam:  General: Frail elderly male resting sitting in hospital chair,  in NAD  Eyes: EOMI, PEERLA ENTM: MMM, oropharnyx normal Neck: supple Cardiovascular: RRR, no MRG  Respiratory: CTAB, easy WOB,  no wheezing noted  Gastrointestinal: soft, NT, ND, +bs  MSK: moves all extremities spontaneously  Derm: 2 large LLE wounds as pictured in H&P - nontender, no discharge noted   Laboratory:  Recent Labs Lab 01/13/17 1200 01/14/17 0449  WBC 7.1 6.2  HGB 13.0 10.2*  HCT 39.2 30.7*  PLT  176 160    Recent Labs Lab 01/13/17 1202 01/14/17 0449  NA 129* 136  K 4.5 4.2  CL 97* 108  CO2 22 23  BUN 20 20  CREATININE 2.24* 1.73*  CALCIUM 9.5 8.8*  GLUCOSE 459* 345*   Imaging/Diagnostic Tests: Dg Chest 2 View  Result Date: 01/13/2017 CLINICAL DATA:  Syncope. EXAM: CHEST  2 VIEW COMPARISON:  08/28/2016. FINDINGS: Normal sized heart. Clear lungs. Aortic arch calcifications. Unremarkable bones. IMPRESSION: No acute abnormality.  Aortic atherosclerosis.  This Electronically Signed   By: Claudie Revering M.D.   On: 01/13/2017 13:39   Ct Head Wo Contrast  Result Date: 01/13/2017 CLINICAL DATA:  Syncope and dizziness. EXAM: CT HEAD WITHOUT CONTRAST TECHNIQUE: Contiguous axial images were obtained from the base of the skull through the vertex without intravenous contrast. COMPARISON:  08/27/2016 FINDINGS: Brain: No evidence of acute infarction, hemorrhage, hydrocephalus, extra-axial collection or mass lesion/mass effect. Mild brain parenchymal volume loss. Vascular: Calcific atherosclerotic disease at the skullbase. Skull: Normal. Negative for fracture or focal lesion. Sinuses/Orbits: Partial opacification of bilateral ethmoid and left maxillary sinuses. Other: None. IMPRESSION: No acute intracranial abnormality. Mild brain parenchymal volume loss. Intracranial calcific atherosclerotic disease. Chronic sinusitis. Electronically Signed   By: Fidela Salisbury M.D.   On: 01/13/2017 16:45    Lovenia Kim, MD 01/14/2017, 12:46 PM PGY-1, [place] Intern pager: [phone], text pages welcome

## 2017-01-14 NOTE — Consult Note (Signed)
[place] Nurse wound consult note Reason for Consult:Patient with full thickness injury to left knee.  Right leg does fall and contact this area, and patient is very thin, but there is scarring present from previous ulcerations.  Wound is dry and intact, 100% scabbed eschar Wound type:Unclear etiology Pressure Injury POA: Yes Measurement:2.4 cm x 2 cm eschar covered lesion that is dry and intact.  No current treatments were being performed at home.  Wound bed:100% dry, intact eschar Drainage (amount, consistency, odor) None noted.  Periwound:intact.  Scarring above knee to both legs from previous circular lesions.  Dressing procedure/placement/frequency:Silicone border foam dressing to pad and protect this area.  Will not follow at this time.  Please re-consult if needed.  Domenic Moras RN BSN [place] Pager [phone]

## 2017-01-14 NOTE — Progress Notes (Signed)
Hyperglycemia Event  CBG: 528  MD notified: Family Medicine at 1223  Time Responded: 1225  Will give 10 units of Novolog once per MD

## 2017-01-14 NOTE — Progress Notes (Signed)
  Echocardiogram 2D Echocardiogram has been performed.  Jennette Dubin 01/14/2017, 10:51 AM

## 2017-01-14 NOTE — Evaluation (Signed)
Physical Therapy Evaluation Patient Details Name: Brian Owen MRN: [id number] DOB: [birth year] Today's Date: 01/14/2017   History of Present Illness   66 Y/O M with PMX of DM2, Chronic tobacco smoker, CKD, HTN, orthostatic hypotension presented with dizziness and fall.  Clinical Impression  Pt admitted with above diagnosis. Pt currently with functional limitations due to the deficits listed below (see PT Problem List).  Pt will benefit from skilled PT to increase their independence and safety with mobility to allow discharge to the venue listed below.  Pt reaching out for external support with gait and would benefit from RW and 3-1 BSC at home, as well as HHPT.  Pt orthostatic, but denies dizziness or lightheadedness. Supine 177/80 Sitting 121/74 Standing 84/56 Standing after 3 mins 82/56     Follow Up Recommendations Home health PT;Supervision for mobility/OOB    Equipment Recommendations  Rolling walker with 5" wheels;3in1 (PT)    Recommendations for Other Services       Precautions / Restrictions Precautions Precautions:  Fall Restrictions Weight Bearing Restrictions: No      Mobility  Bed Mobility Overal bed mobility: Modified Independent             General bed mobility comments: no A required  Transfers Overall transfer level: Needs assistance   Transfers: Sit to/from Stand Sit to Stand: Supervision         General transfer comment: S for steadying  Ambulation/Gait Ambulation/Gait assistance: Min guard Ambulation Distance (Feet): 20 Feet Assistive device: None Gait Pattern/deviations: Step-through pattern;Trunk flexed     General Gait Details: pt with decreased balance with stops and turns.  reaches out for bed, wall, external support with gait. Denies dizziness or lightheadedness, but hypotensive.  Stairs            Wheelchair Mobility    Modified Rankin (Stroke Patients Only)       Balance Overall balance assessment: Needs assistance    Sitting balance-Leahy Scale: Good       Standing balance-Leahy Scale: Fair Standing balance comment: fair static, poor dynamic                             Pertinent Vitals/Pain Pain Assessment: No/denies pain    Home Living Family/patient expects to be discharged to:: (P) Private residence Living Arrangements: (P) Spouse/significant other Available Help at Discharge: (P) Family;Available 24 hours/day Type of Home: (P) House Home Access: (P) Level entry     Home Layout: (P) One level Home Equipment: None Additional Comments: Interpreter line used and Dominica Severin assisted with [place] language    Prior Function Level of Independence: Independent with assistive device(s)   Gait / Transfers Assistance Needed: [place].  Used to use cane, but is not now. Requesting RW. Per old PT notes he has used a RW in the past.           Hand Dominance        Extremity/Trunk Assessment   Upper Extremity Assessment Upper Extremity Assessment: Defer to OT evaluation    Lower Extremity Assessment Lower Extremity Assessment: Generalized weakness       Communication   Communication: Prefers language other than English ([place])  Cognition Arousal/Alertness: Awake/alert Behavior During Therapy: WFL for tasks assessed/performed Overall Cognitive Status: Within Functional Limits for tasks assessed                      General Comments General comments (skin integrity, edema, etc.):  wound on L knee. Scars from other old wounds.  Pt does not know what is causing them    Exercises     Assessment/Plan    PT Assessment Patient needs continued PT services  PT Problem List Decreased strength;Decreased activity tolerance;Decreased balance;Decreased mobility;Cardiopulmonary status limiting activity       PT Treatment Interventions DME instruction;Gait training;Functional mobility training;Therapeutic activities;Therapeutic exercise;Patient/family education     PT Goals (Current goals can be found in the Care Plan section)  Acute Rehab PT Goals Patient Stated Goal: get a walker to help him at home PT Goal Formulation: With patient Time For Goal Achievement: 01/21/17 Potential to Achieve Goals: Good    Frequency Min 3X/week   Barriers to discharge        Co-evaluation PT/OT/SLP Co-Evaluation/Treatment: Yes Reason for Co-Treatment: (P) For patient/therapist safety PT goals addressed during session: Mobility/safety with mobility OT goals addressed during session: (P) ADL's and self-care       End of Session Equipment Utilized During Treatment: Gait belt Activity Tolerance: Patient tolerated treatment well Patient left: in chair;with call bell/phone within reach;with chair alarm set Nurse Communication: Mobility status PT Visit Diagnosis: Unsteadiness on feet (R26.81)    Functional Assessment Tool Used: AM-PAC 6 Clicks Basic Mobility Functional Limitation: Mobility: Walking and moving around Mobility: Walking and Moving Around Current Status (Q9476): At least 20 percent but less than 40 percent impaired, limited or restricted Mobility: Walking and Moving Around Goal Status [phone]): At least 1 percent but less than 20 percent impaired, limited or restricted    Time: 0857-0931 PT Time Calculation (min) (ACUTE ONLY): 34 min   Charges:   PT Evaluation $PT Eval Moderate Complexity: 1 Procedure     PT G Codes:   PT G-Codes **NOT FOR INPATIENT CLASS** Functional Assessment Tool Used: AM-PAC 6 Clicks Basic Mobility Functional Limitation: Mobility: Walking and moving around Mobility: Walking and Moving Around Current Status (P5465): At least 20 percent but less than 40 percent impaired, limited or restricted Mobility: Walking and Moving Around Goal Status [phone]): At least 1 percent but less than 20 percent impaired, limited or restricted     [facility] LUBECK 01/14/2017, 9:54 AM

## 2017-01-14 NOTE — Progress Notes (Addendum)
Inpatient Diabetes Program Recommendations  AACE/ADA: New Consensus Statement on Inpatient Glycemic Control (2015)  Target Ranges:  Prepandial:   less than 140 mg/dL      Peak postprandial:   less than 180 mg/dL (1-2 hours)      Critically ill patients:  140 - 180 mg/dL   Lab Results  Component Value Date   GLUCAP 293 (H) 01/14/2017   HGBA1C 15.0 (H) 01/13/2017    Review of Glycemic Control  Diabetes history: DM2 Outpatient Diabetes medications: Was supposed to be taking Novolog 70/30 8 units BID, but has not been for 4 months  Current orders for Inpatient glycemic control: none  Inpatient Diabetes Program Recommendations:   Insulin - Basal: Please consider Lantus 7 units daily (47 Kg * 0.15 units/Kg)  Correction (SSI): Please consider Novolog 0-9 units AC and 0-5 units HS  When preparing for discharge, please consider changing patient from current insulin regimen to Novolog 70/30 Mix so that patient can buy insulin from [employer] (Novolin 70/30).  Patient's daughter is primary caregiver and also speaks [place].  According to daughter, patient allows her to take CBG's 4 times/day and give insulin every day.  Daughter states patient has had no low CBG's that she is aware of and only experiences high CBG's within last 3 months.  Goal range of of CBG's between 90-130 mg/dL and A1C to 15% (average CBG = 384 mg/ dL) explained to daughter.  Daughter states that reason for not taking insulin is because of cost (patient could not afford at the beginning of the year).  Daughter states she thinks patient was taking Novolog, so this RN gave patient information on Novolin products.  Daughter also states another barrier is going to the MD office.  Patient may be open to e-visits online, but refuses often to go to MD appointments.    CM consulted to identify options available for above barriers.  Thank you,  Windy Carina, RN, MSN Diabetes Coordinator Inpatient Diabetes Program [phone]  (Team Pager)

## 2017-01-15 DIAGNOSIS — E1122 Type 2 diabetes mellitus with diabetic chronic kidney disease: Secondary | ICD-10-CM | POA: Diagnosis present

## 2017-01-15 DIAGNOSIS — N179 Acute kidney failure, unspecified: Secondary | ICD-10-CM | POA: Diagnosis present

## 2017-01-15 DIAGNOSIS — I951 Orthostatic hypotension: Secondary | ICD-10-CM | POA: Diagnosis present

## 2017-01-15 DIAGNOSIS — N183 Chronic kidney disease, stage 3 (moderate): Secondary | ICD-10-CM | POA: Diagnosis present

## 2017-01-15 DIAGNOSIS — E871 Hypo-osmolality and hyponatremia: Secondary | ICD-10-CM | POA: Diagnosis present

## 2017-01-15 DIAGNOSIS — L97909 Non-pressure chronic ulcer of unspecified part of unspecified lower leg with unspecified severity: Secondary | ICD-10-CM | POA: Diagnosis present

## 2017-01-15 DIAGNOSIS — Z87891 Personal history of nicotine dependence: Secondary | ICD-10-CM | POA: Diagnosis not present

## 2017-01-15 DIAGNOSIS — N390 Urinary tract infection, site not specified: Secondary | ICD-10-CM | POA: Diagnosis present

## 2017-01-15 DIAGNOSIS — I129 Hypertensive chronic kidney disease with stage 1 through stage 4 chronic kidney disease, or unspecified chronic kidney disease: Secondary | ICD-10-CM | POA: Diagnosis present

## 2017-01-15 DIAGNOSIS — Z833 Family history of diabetes mellitus: Secondary | ICD-10-CM | POA: Diagnosis not present

## 2017-01-15 DIAGNOSIS — Z794 Long term (current) use of insulin: Secondary | ICD-10-CM | POA: Diagnosis not present

## 2017-01-15 DIAGNOSIS — E43 Unspecified severe protein-calorie malnutrition: Secondary | ICD-10-CM | POA: Diagnosis present

## 2017-01-15 DIAGNOSIS — E11622 Type 2 diabetes mellitus with other skin ulcer: Secondary | ICD-10-CM | POA: Diagnosis present

## 2017-01-15 DIAGNOSIS — E1165 Type 2 diabetes mellitus with hyperglycemia: Secondary | ICD-10-CM | POA: Diagnosis present

## 2017-01-15 DIAGNOSIS — R55 Syncope and collapse: Secondary | ICD-10-CM | POA: Diagnosis not present

## 2017-01-15 DIAGNOSIS — E1143 Type 2 diabetes mellitus with diabetic autonomic (poly)neuropathy: Secondary | ICD-10-CM | POA: Diagnosis present

## 2017-01-15 DIAGNOSIS — E860 Dehydration: Secondary | ICD-10-CM | POA: Diagnosis present

## 2017-01-15 DIAGNOSIS — B952 Enterococcus as the cause of diseases classified elsewhere: Secondary | ICD-10-CM | POA: Diagnosis present

## 2017-01-15 LAB — BASIC METABOLIC PANEL
ANION GAP: 8 (ref 5–15)
BUN: 21 mg/dL — AB (ref 6–20)
CALCIUM: 9.3 mg/dL (ref 8.9–10.3)
CO2: 27 mmol/L (ref 22–32)
Chloride: 102 mmol/L (ref 101–111)
Creatinine, Ser: 1.78 mg/dL — ABNORMAL HIGH (ref 0.61–1.24)
GFR calc Af Amer: 44 mL/min — ABNORMAL LOW (ref >60)
GFR, EST NON AFRICAN AMERICAN: 38 mL/min — AB (ref >60)
Glucose, Bld: 118 mg/dL — ABNORMAL HIGH (ref 65–99)
POTASSIUM: 3.5 mmol/L (ref 3.5–5.1)
Sodium: 137 mmol/L (ref 135–145)

## 2017-01-15 LAB — CORTISOL-AM, BLOOD
CORTISOL - AM: 11.7 ug/dL (ref 6.7–22.6)
CORTISOL - AM: 14 ug/dL (ref 6.7–22.6)

## 2017-01-15 LAB — GLUCOSE, CAPILLARY
GLUCOSE-CAPILLARY: 127 mg/dL — AB (ref 65–99)
Glucose-Capillary: 237 mg/dL — ABNORMAL HIGH (ref 65–99)
Glucose-Capillary: 158 mg/dL — ABNORMAL HIGH (ref 65–99)

## 2017-01-15 LAB — CBC
HCT: 30.3 % — ABNORMAL LOW (ref 39.0–52.0)
Hemoglobin: 10 g/dL — ABNORMAL LOW (ref 13.0–17.0)
MCH: 28.8 pg (ref 26.0–34.0)
MCHC: 33 g/dL (ref 30.0–36.0)
MCV: 87.3 fL (ref 78.0–100.0)
Platelets: 152 10*3/uL (ref 150–400)
RBC: 3.47 MIL/uL — ABNORMAL LOW (ref 4.22–5.81)
RDW: 13.4 % (ref 11.5–15.5)
WBC: 7.9 10*3/uL (ref 4.0–10.5)

## 2017-01-15 MED ORDER — MIDODRINE HCL 5 MG PO TABS
2.5000 mg | ORAL_TABLET | Freq: Three times a day (TID) | ORAL | Status: DC
  Administered 2017-01-15 – 2017-01-16 (×3): 2.5 mg via ORAL
  Filled 2017-01-15 (×3): qty 1

## 2017-01-15 MED ORDER — SODIUM CHLORIDE 0.9 % IV SOLN
INTRAVENOUS | Status: DC
  Administered 2017-01-15: 75 mL via INTRAVENOUS

## 2017-01-15 MED ORDER — PRO-STAT SUGAR FREE PO LIQD
30.0000 mL | Freq: Every day | ORAL | Status: DC
  Administered 2017-01-16: 30 mL via ORAL
  Filled 2017-01-15: qty 30

## 2017-01-15 MED ORDER — GLUCERNA SHAKE PO LIQD
237.0000 mL | Freq: Three times a day (TID) | ORAL | Status: DC
  Administered 2017-01-15 – 2017-01-16 (×4): 237 mL via ORAL

## 2017-01-15 MED ORDER — INSULIN ASPART PROT & ASPART (70-30 MIX) 100 UNIT/ML ~~LOC~~ SUSP
4.0000 [IU] | Freq: Two times a day (BID) | SUBCUTANEOUS | Status: DC
  Administered 2017-01-16 (×2): 4 [IU] via SUBCUTANEOUS
  Filled 2017-01-15: qty 10

## 2017-01-15 NOTE — Consult Note (Signed)
   [facility] CM Inpatient Consult   01/15/2017  Brian Owen [birth year] [id number]  Referrals received for [facility] post hospital follow up for medication from Diabetes Coordinator  and MD office for community resources.  Came by to speak with patient after speaking with inpatient RNCM, Levada Dy, but the patient was receiving his bath and nursing care. No family in the room at this time.   Chart review per H&P, the patient Brian Owen is a 66 y.o. male presenting with syncope at home, as well as hyperglycemia. PMH is significant for T2DM, orthostatic hypotension, CKDIII. Call placed to the Office of Inclusion, [employer] line [phone],[[phone]]  spoke with Gary, [place] [phone], spoke with the patient at the bedside regarding referral for medication needs [can't afford his insulin] and food resources for [place] [facility] for post hospital follow up.  Patient agreed to services verbally wanted his daughter to review information in the folder given. He said he will be happy for the services.  Will have the [facility] team to follow for post hospital needs.  For questions, please contact:  Natividad Brood, RN BSN [facility] Liaison  [phone] business mobile phone Toll free office [phone]

## 2017-01-15 NOTE — Care Management Obs Status (Signed)
[place] NOTIFICATION   Patient Details  Name: Brian Owen MRN: [id number] Date of Birth: 03-Jul-1951   Medicare Observation Status Notification Given:  Yes    Erenest Rasher, RN 01/15/2017, 9:31 AM

## 2017-01-15 NOTE — Progress Notes (Signed)
Physical Therapy Treatment Patient Details Name: Suzanne Garbers MRN: [id number] DOB: 05-Nov-1950 Today's Date: 01/15/2017    History of Present Illness 66 Y/O M with PMX of DM2, Chronic tobacco smoker, CKD, HTN, orthostatic hypotension presented with dizziness and fall.    PT Comments    Pt performed increased activity.  Pt remains hypotensive.  Pt performed gait with RW and poor balance and safety awareness.  Pt will continue to benefit from skilled therapy during acute hospitalization.  Recorded orthostatics during session.  [id number] Sitting-102/60 Standing-97/48    Follow Up Recommendations  Home health PT;Supervision for mobility/OOB     Equipment Recommendations  Rolling walker with 5" wheels;3in1 (PT)    Recommendations for Other Services       Precautions / Restrictions Precautions Precautions: Fall Restrictions Weight Bearing Restrictions: No    Mobility  Bed Mobility Overal bed mobility: Modified Independent             General bed mobility comments: no A required  Transfers Overall transfer level: Needs assistance   Transfers: Sit to/from Stand Sit to Stand: Supervision         General transfer comment: Supervision for safety.  Ambulation/Gait Ambulation/Gait assistance: Min assist Ambulation Distance (Feet): 160 Feet Assistive device: Rolling walker (2 wheeled) Gait Pattern/deviations: Step-through pattern;Trunk flexed;Wide base of support;Decreased stride length   Gait velocity interpretation: Below normal speed for age/gender General Gait Details: pt with decreased balance with stops and turns.  Poor ability to maintain position in RW.  Pt remains hypotensive but denies dizziness.  Cues for RW safety.     Stairs            Wheelchair Mobility    Modified Rankin (Stroke Patients Only)       Balance Overall balance assessment: Needs assistance   Sitting balance-Leahy Scale: Good       Standing balance-Leahy Scale:  Fair Standing balance comment: fair static, poor dynamic                    Cognition Arousal/Alertness: Awake/alert Behavior During Therapy: WFL for tasks assessed/performed Overall Cognitive Status: Within Functional Limits for tasks assessed                      Exercises      General Comments        Pertinent Vitals/Pain Pain Assessment: No/denies pain    Home Living                      Prior Function            PT Goals (current goals can now be found in the care plan section) Acute Rehab PT Goals Patient Stated Goal: get a walker to help him at home Potential to Achieve Goals: Good Progress towards PT goals: Progressing toward goals    Frequency    Min 3X/week      PT Plan Current plan remains appropriate    Co-evaluation             End of Session Equipment Utilized During Treatment: Gait belt Activity Tolerance: Patient tolerated treatment well Patient left: with call bell/phone within reach;with bed alarm set;in bed Nurse Communication: Mobility status PT Visit Diagnosis: Unsteadiness on feet (R26.81)     Time: 3710-6269 PT Time Calculation (min) (ACUTE ONLY): 28 min  Charges:  $Gait Training: 8-22 mins $Therapeutic Activity: 8-22 mins  G Codes:       Cristela Blue 01/27/17, 4:20 PM Governor Rooks, PTA pager [phone]

## 2017-01-15 NOTE — Progress Notes (Signed)
Family Medicine Teaching Service Daily Progress Note Intern Pager: [phone]  Patient name: Brian Owen Medical record number: [id number] Date of birth: [birth year] Age: 66 y.o. Gender: male  Primary Care Provider: [name], MD Consultants: None Code Status: FULL   [name]:  Admit 3/18   Assessment and Plan: Maddock Finigan is a 66 y.o. male presenting with syncope at home, as well as hyperglycemia. PMH is significant for T2DM, orthostatic hypotension, CKDIII.   Syncope: Hx of similar episodes with multiple admissions for such where hydration and control of CBGs were recommended.  Echo in 05/2016 with EF 55-60% and normal wall motion.  BNP wnl, 15.  EKG without acute ischemic changes. CT head NAICA with chronic sinusitis. Orthostatic vitals positive in ED.  Repeat orthostatics after hydration were performed and patient exhibiting orthostatic hypotension. TSH 0.78 wnl.  A1c 15%.  Echo with EF 60-65%.  Carotid dopplers negative.  AM cortisol 14, wnl. Patient denies dizziness this morning with no associated symptoms. Of note, urine culture with >100,000 colonies/ml unidentified organism.  He is not currently endorsing symptoms.  -Will start Midodrine 2.5 mg TID  this AM in setting of orthostatic hypotension and plan to continue on discharge.  -Fall precautions   T2DM: CBG 459 on admission with normal AG.   Based on chart review, several episodes of hypoglycemia in 2017.  Home insulin was 8U 70/30 BID, but reportedly has not taken insulin in five months because he ran out.  A1c 15% on this admission.   -Noon CBG check elevated to 528 on 3/19.  Gave one time 10U Novolog plus addition of 10 U Lantus daily.   -CBGs AC QHS - overnight 204, 136.  -Continue sensitive SSI and 10 U Lantus daily  -Consult to diabetic coordinator for education  >> recommended Novolin 70/30 per his prior regimen.  Patient reports barrier to follow ups is getting to the doctor's office.   Concerning if sending him home with insulin and no follow-up. Will discuss with his daughter and if able to establish a good insulin regimen at home, would feel comfortable discharging home with insulin.    Acute on Chronic Kidney Disease: Baseline Cr 1.3-1.4. Cr on presentation 2.2, and patient appearing clinically dehydrated.  - Cr this AM 1.78    -Monitor BMET   Leg ulcers: patient reports history of similar ulcers for 2 years. Saw PCP for these in the past with wound culture with MSSA and s/p course of bactrim. Followed with wound care in the past. Per chart review, these began as bullae. HIV last NR 07/2016. Differential includes venous insufficiency, pyoderma gangrenosum, additional etiologies likely such as cutaneous amebiasis or bacillary angiomatosis (as patient is from [place]) could be considered if HIV positive.  HIV on this admission negative.   -Wound care consult  -ID consult for history of parasitic infection per chart review which he did not get surgery for >> discussed with Dr. Megan Salon.  Recommends outpatient follow up with biopsy.    Severe protein calorie malnutrition: Patient reports he only eats 2 meals per day. Family reports they have adequate food at home. Family also reports difficulty walking at home for >1 year. Most recent albumin low (2.9) on  08/28/2016 likely c/w malnutrition. Repeat albumin low on this admission, 3.1.  -Consult to nutrition placed   -PT/OT eval - PT rec HHPT and rolling walker with wheels and 3-in-1 bedside commode.  DME orders placed.   FEN/GI: carb modified diet  Prophylaxis: lovenox  Disposition: Possible discharge home tomorrow .  Monitor overnight to see if Midodrine helps with orthostatics.  Will discuss home insulin regimen with daughter.    Subjective:  Patient states feeling well.  Denies pain anywhere. [employer] interpreters used to communicate as he speaks [place].  Is tolerating diet without nausea/vomiting.     Objective: Temp:  [98.5 F (36.9 C)-99.5 F (37.5 C)] 98.5 F (36.9 C) (03/20 0921) Pulse Rate:  [50-82] 76 (03/20 0921) Resp:  [16-20] 20 (03/20 0921) BP: (95-116)/(56-66) 116/62 (03/20 0921) SpO2:  [88 %-100 %] 100 % (03/20 0921) Weight:  [108 lb 3.9 oz (49.1 kg)] 108 lb 3.9 oz (49.1 kg) (03/20 0450)   Physical Exam:  General: Frail elderly male resting sitting in hospital chair eating breakfast, in NAD   Eyes: EOMI, PEERLA ENTM: MMM, oropharnyx normal Neck: supple Cardiovascular: RRR, no MRG  Respiratory: CTAB, easy WOB, no wheezing noted  Gastrointestinal: soft, NT, ND, +bs  MSK: moves all extremities spontaneously  Derm: 2 large LLE wounds as pictured in H&P - nontender, no discharge noted  Psych: normal affect and mood   Laboratory:  Recent Labs Lab 01/13/17 1200 01/14/17 0449 01/15/17 0520  WBC 7.1 6.2 7.9  HGB 13.0 10.2* 10.0*  HCT 39.2 30.7* 30.3*  PLT 176 160 152    Recent Labs Lab 01/13/17 1202 01/14/17 0449 01/15/17 0520  NA 129* 136 137  K 4.5 4.2 3.5  CL 97* 108 102  CO2 22 23 27   BUN 20 20 21*  CREATININE 2.24* 1.73* 1.78*  CALCIUM 9.5 8.8* 9.3  GLUCOSE 459* 345* 118*   Imaging/Diagnostic Tests: No results found.  Lovenia Kim, MD 01/15/2017, 12:15 PM PGY-1, [place] Intern pager: [phone], text pages welcome

## 2017-01-15 NOTE — Progress Notes (Signed)
Inpatient Diabetes Program Recommendations  AACE/ADA: New Consensus Statement on Inpatient Glycemic Control (2015)  Target Ranges:  Prepandial:   less than 140 mg/dL      Peak postprandial:   less than 180 mg/dL (1-2 hours)      Critically ill patients:  140 - 180 mg/dL   Lab Results  Component Value Date   GLUCAP 127 (H) 01/15/2017   HGBA1C 15.0 (H) 01/13/2017    Review of Glycemic Control  Diabetes history: DM with history of low CBG's Outpatient Diabetes medications: Was supposed to be taking Novolog 70/30 8 units BID, but has not been for 4 months (in the "donut hole" according to patient's daughter) Current orders for Inpatient glycemic control: Lantus 10 units daily @ 1000, Novolog 0-9 units TID [facility]  Inpatient Diabetes Program Recommendations:   Insulin - Basal: To prepare patient for discharge, please consider changing from Lantus to NPH 5 units BID starting 3/21  Insulin - Meal Coverage: Please consider adding Novolog 2 units TID AC if patient eats > 50% of meal.  Based on current CBG trends and conversation with daughter yesterday, patient appears to eat inconsistently.  Discharging patient on both NPH and Regular insulins may help daughter to prevent patient from experiencing low CBG's when patient does not eat but still does need basal insulin.  Thank you,  Windy Carina, RN, MSN Diabetes Coordinator Inpatient Diabetes Program [phone] (Team Pager)

## 2017-01-15 NOTE — Progress Notes (Addendum)
Initial Nutrition Assessment  DOCUMENTATION CODES:   Severe malnutrition in context of chronic illness  INTERVENTION:    Glucerna Shake po TID, each supplement provides 220 kcal and 10 grams of protein   Continue Prostat po daily, each supplement provides 100 kcals and 15 grams of protien   NUTRITION DIAGNOSIS:   Malnutrition related to chronic illness as evidenced by severe depletion of body fat, severe depletion of muscle mass.  GOAL:   Patient will meet greater than or equal to 90% of their needs  MONITOR:   PO intake, Supplement acceptance, Labs, Skin, I & O's  REASON FOR ASSESSMENT:   Consult Assessment of nutrition requirement/status  ASSESSMENT:   66 y.o. Male presented with syncope at home, as well as hyperglycemia. PMH is significant for T2DM, orthostatic hypotension, CKDIII.   RD unable to obtain nutrition hx upon visit; pt unintelligible.  Per Family Medicine note, pt only consumes 2 meals per day. CWOCN note reviewed.  Pt with full thickness injury to L knee. Medications reviewed and include Novolog and Prostat liquid protein. PO intake 100% per flowsheet records. CBG's 136-127-237.  Nutrition-Focused physical exam completed. Findings are severe fat depletion, severe muscle depletion, and no edema.   Diet Order:  Diet Carb Modified Fluid consistency: Thin; Room service appropriate? Yes  Skin:  Wound (see comment) (knee wound)  Last BM:  3/20  Height:   Ht Readings from Last 1 Encounters:  01/13/17 5\' 4"  (1.626 m)   Weight:   Wt Readings from Last 1 Encounters:  01/15/17 108 lb 3.9 oz (49.1 kg)   Ideal Body Weight:  59 kg  BMI:  Body mass index is 18.58 kg/m.  Estimated Nutritional Needs:   Kcal:  1500-1700  Protein:  75-85 gm  Fluid:  1.5-1.7 L  EDUCATION NEEDS:   No education needs identified at this time  Arthur Holms, RD, LDN Pager #: [phone] After-Hours Pager #: [phone]

## 2017-01-15 NOTE — Care Management Note (Signed)
Case Management Note  Patient Details  Name: Brian Owen MRN: [id number] Date of Birth: 1951-08-05  Subjective/Objective:       Pt with hx of DM2, Chronic tobacco smoker, CKD, HTN, orthostatic hypotension presented with dizziness and fall. From with family. CM spoke with pt with [place] interpreter Beatrix Shipper   / [phone]) regarding d/c planning. Pt speaks little [place].      Hearl Heikes (Daughter)     [phone]       PCP: Boykin Nearing  Action/Plan: Plan is to d/c to home when medically stable. CM to f/u with disposition needs.  Expected Discharge Date:                  Expected Discharge Plan:  [place]  In-House Referral:     Discharge planning Services  CM Consult  Post Acute Care Choice:    Choice offered to:  Patient  DME Arranged:  Walker rolling DME Agency:  [facility] [employer] referral made with [place] @ [phone]  HH Arranged:  RN, PT [facility] Agency:  [employer] pending MD order, referral made with Butch [facility] @ [phone]. CM requested orders from MD.  Status of Service:  In process, will continue to follow  If discussed at Long Length of Stay Meetings, dates discussed:    Additional Comments:  Sharin Mons, RN 01/15/2017, 4:44 PM

## 2017-01-16 LAB — GLUCOSE, CAPILLARY
GLUCOSE-CAPILLARY: 278 mg/dL — AB (ref 65–99)
GLUCOSE-CAPILLARY: 307 mg/dL — AB (ref 65–99)
Glucose-Capillary: 157 mg/dL — ABNORMAL HIGH (ref 65–99)

## 2017-01-16 LAB — CBC
HEMATOCRIT: 33.3 % — AB (ref 39.0–52.0)
HEMOGLOBIN: 10.9 g/dL — AB (ref 13.0–17.0)
MCH: 29.1 pg (ref 26.0–34.0)
MCHC: 32.7 g/dL (ref 30.0–36.0)
MCV: 88.8 fL (ref 78.0–100.0)
Platelets: 166 10*3/uL (ref 150–400)
RBC: 3.75 MIL/uL — AB (ref 4.22–5.81)
RDW: 13.5 % (ref 11.5–15.5)
WBC: 6.4 10*3/uL (ref 4.0–10.5)

## 2017-01-16 LAB — URINE CULTURE

## 2017-01-16 LAB — BASIC METABOLIC PANEL
ANION GAP: 8 (ref 5–15)
BUN: 27 mg/dL — ABNORMAL HIGH (ref 6–20)
CO2: 20 mmol/L — AB (ref 22–32)
Calcium: 9.4 mg/dL (ref 8.9–10.3)
Chloride: 102 mmol/L (ref 101–111)
Creatinine, Ser: 1.63 mg/dL — ABNORMAL HIGH (ref 0.61–1.24)
GFR calc Af Amer: 49 mL/min — ABNORMAL LOW (ref >60)
GFR calc non Af Amer: 42 mL/min — ABNORMAL LOW (ref >60)
GLUCOSE: 362 mg/dL — AB (ref 65–99)
Potassium: 4.4 mmol/L (ref 3.5–5.1)
Sodium: 130 mmol/L — ABNORMAL LOW (ref 135–145)

## 2017-01-16 MED ORDER — MIDODRINE HCL 5 MG PO TABS
5.0000 mg | ORAL_TABLET | Freq: Three times a day (TID) | ORAL | Status: DC
  Administered 2017-01-16 (×2): 5 mg via ORAL
  Filled 2017-01-16 (×2): qty 1

## 2017-01-16 MED ORDER — MIDODRINE HCL 5 MG PO TABS: 5.0000 mg | ORAL_TABLET | Freq: Three times a day (TID) | ORAL | 2 refills | Status: DC

## 2017-01-16 NOTE — Progress Notes (Signed)
Occupational Therapy Treatment Patient Details Name: Brian Owen MRN: [id number] DOB: [birth year] Today's Date: 01/16/2017    History of present illness 66 Y/O M with PMX of DM2, Chronic tobacco smoker, CKD, HTN, orthostatic hypotension presented with dizziness and fall.   OT comments  Pt able to complete functional mobility this session and LB dressing with min guard assist. Pt unsteady in standing and moves quickly with transfers despite use of RW and education on safety with RW. Reviewed fall prevention and safety strategies with RW; pt verbalized understanding but not implementing into functional activities. D/c plan remains appropriate. Will continue to follow acutely.   Follow Up Recommendations  Home health OT;Supervision/Assistance - 24 hour    Equipment Recommendations  3 in 1 bedside commode    Recommendations for Other Services      Precautions / Restrictions Precautions Precautions: Fall Restrictions Weight Bearing Restrictions: No       Mobility Bed Mobility Overal bed mobility: Modified Independent                Transfers Overall transfer level: Needs assistance Equipment used: Rolling walker (2 wheeled) Transfers: Sit to/from Stand Sit to Stand: Supervision         General transfer comment: Cues for hand placement    Balance Overall balance assessment: Needs assistance Sitting-balance support: No upper extremity supported;Feet supported Sitting balance-Leahy Scale: Good     Standing balance support: Bilateral upper extremity supported Standing balance-Leahy Scale: Poor                     ADL Overall ADL's : Needs assistance/impaired                     Lower Body Dressing: Min guard;Sit to/from stand   Toilet Transfer: Min guard;Ambulation;BSC;RW Toilet Transfer Details (indicate cue type and reason): Simulated by sit to stand from EOB with functional mobility         Functional mobility during ADLs: Min guard;Rolling  walker General ADL Comments: Educated pt on safety with RW; pt moves quickly and walks behind RW. Educated on fall prevention and home safety.      Vision                     Perception     Praxis      Cognition   Behavior During Therapy: WFL for tasks assessed/performed Overall Cognitive Status: Within Functional Limits for tasks assessed                  General Comments: Moves quickly with mobility      Exercises     Shoulder Instructions       General Comments      Pertinent Vitals/ Pain       Pain Assessment: No/denies pain  Home Living                                          Prior Functioning/Environment              Frequency  Min 1X/week        Progress Toward Goals  OT Goals(current goals can now be found in the care plan section)  Progress towards OT goals: Progressing toward goals  Acute Rehab OT Goals Patient Stated Goal: go home OT Goal Formulation: With patient  Plan Discharge plan remains appropriate  Co-evaluation                 End of Session Equipment Utilized During Treatment: Rolling walker  OT Visit Diagnosis: Unsteadiness on feet (R26.81)   Activity Tolerance Patient tolerated treatment well   Patient Left in bed;with call bell/phone within reach;with bed alarm set   Nurse Communication          Time: 0034-9179 OT Time Calculation (min): 11 min  Charges: OT General Charges $OT Visit: 1 Procedure OT Treatments $Self Care/Home Management : 8-22 mins  Jerrard Bradburn A. Ulice Brilliant, M.S., OTR/L Pager: [facility] 01/16/2017, 3:22 PM

## 2017-01-16 NOTE — Progress Notes (Signed)
The tech checked pt BP 74/50 I rechecked it again 115/64  Will continue to monitor pt

## 2017-01-16 NOTE — Discharge Summary (Signed)
[facility] Discharge Summary  Patient name: Brian Owen Medical record number: [id number] Date of birth: 29-Oct-1951 Age: 66 y.o. Gender: male Date of Admission: 01/13/2017  Date of Discharge: 01/16/2017  Admitting Physician: Kinnie Feil, MD  Primary Care Provider: [name], MD Consultants: None   Indication for Hospitalization: Syncopal episode, hyperglycemia   Discharge Diagnoses/Problem List:   Syncope Orthostatic hypotension Hyperglycemia T2DM CKD III Leg ulcer    Disposition: Home   Discharge Condition: Stable, improved   Discharge Exam:  General: frail elderly male resting sitting in hospital chair eating breakfast, in NAD  Eyes: EOMI, PEERLA ENTM: MMM, oropharnyx normal Neck: supple Cardiovascular: RRR, no MRG  Respiratory: CTAB, easy WOB, no wheezing noted  Gastrointestinal: soft, NT, ND, +bs  MSK: moves all extremities spontaneously  Derm: 2 large LLE wounds,  nontender, no discharge noted  Psych: normal affect and mood   Brief Hospital Course:   Syncope Brian Owen is a 67 year old male who presented to the ED following a syncopal episode.  He has a history of multiple admissions for such where hydration and control of CBGs were recommended.  Prior echo in 05/2016 which showed 55-60% EF and normal wall motion. Repeat echo on this admission with EF of 60-65%.  BNP normal and EKG with no acute ischemic changes. CT head also negative.  Orthostatic vitals positive in ED.    TSH normal and carotid dopplers negative. AM Cortisol level also normal.  Patient denying dizziness and no associated symptoms.  He was started on Midodrine 2.5 mg TID and titrated up to 5 mg TID with plans to continue this on discharge.  Patient had no further syncopal episodes and likely due to his orthostatic hypotension.  At time of discharge patient's blood pressures were stable and he was at his baseline.   Hyperglycemia /T2DM CBG 459 and A1c >15% on  admission.  History of several episodes of hypoglycemia in 2017.  His home insulin regimen was 8U 70/30 but reportedly had not taken insulin in 5 months because he ran out.  He was put on SSI while hospitalized and started on Lantus 10 U daily.  Transitioned to Novolin 70/30 once CBGs were monitored.   Nutrition and DM coordinator were consulted as well.  His daughter administers insulin to him and discussed with her the proper way to administer.  She noted it was difficult to get to follow ups so we have arranged [facility] for him.    ?UTI Of note, patient's urine culture positive for >100,000 colonies/ml enterococcus faecalis.  Was also present on prior admissions and at that time he was treated with Augmentin.  As patient was asymptomatic and denying urinary frequency, urgency and dysuria, we did not treat with antibiotics and suspect likely colonization.  He had been referred to urology in the past and unclear if he followed up.    Leg ulcers Patient with chronic ulcers for 2 years which began as bullae.  He had seen his PCP in the past with wound culture +for MSSA and completed a course of Bactrim.  Wound care consulted and he has been followed by wound care in the past.  HIV negative. Per chart review, history of parasitic infection on that leg for which he was supposed to get surgery, however he did not.  ID was consulted and recommended outpatient follow-up with biopsy of the edge of ulcer for further investigation.    Issues for Follow Up:  1. Patient with orthostatic  hypotension on admission and some hypotension.  Was started on Midodrine 5 mg TID.  Please make sure patient has gotten this medication and is taking it.   2. A1c of >15%.  Home regimen is Novolin 70/30.  Please recheck A1c and blood sugar to ensure he is taking his insulin correctly.  His daughter is aware of regimen and administers it for him.   3. Leg ulcers chronic x2 years.  Concerning for an infection and ID was consulted this  admission.  Has been seen by wound care multiple times.  ID recommended outpatient follow up and biopsy of ulcer edge in order to determine underlying cause.  Please refer for biopsy if patient agrees.   4. Was found to have enterococcus faecalis on urine cx but patient is asymptomatic.  He had this on previous admissions as well and was treated with Augmentin.  No antibiotics given this admission for it but considering it may be a colonization.  Was previously referred to urologist and unsure if he followed up.  Consider urology referral.   Significant Procedures: None   Significant Labs and Imaging:   Recent Labs Lab 01/14/17 0449 01/15/17 0520 01/16/17 0926  WBC 6.2 7.9 6.4  HGB 10.2* 10.0* 10.9*  HCT 30.7* 30.3* 33.3*  PLT 160 152 166    Recent Labs Lab 01/13/17 1202 01/14/17 0449 01/14/17 0800 01/15/17 0520 01/16/17 0926  NA 129* 136  --  137 130*  K 4.5 4.2  --  3.5 4.4  CL 97* 108  --  102 102  CO2 22 23  --  27 20*  GLUCOSE 459* 345*  --  118* 362*  BUN 20 20  --  21* 27*  CREATININE 2.24* 1.73*  --  1.78* 1.63*  CALCIUM 9.5 8.8*  --  9.3 9.4  ALBUMIN  --   --  3.1*  --   --    Results/Tests Pending at Time of Discharge: None  Discharge Medications:  Allergies as of 01/16/2017   No Known Allergies     Medication List    STOP taking these medications   feeding supplement (PRO-STAT SUGAR FREE 64) Liqd     TAKE these medications   ACCU-CHEK AVIVA PLUS w/Device Kit 1 Device by Does not apply route 3 (three) times daily after meals. E11.65   ACCU-CHEK SOFTCLIX LANCETS lancets 1 each by Other route 3 (three) times daily. E11.65   aspirin 81 MG EC tablet Take 1 tablet (81 mg total) by mouth daily.   camphor-menthol lotion Commonly known as:  SARNA Apply 1 application topically as needed for itching.   glucose blood test strip Commonly known as:  ACCU-CHEK AVIVA PLUS 1 each by Other route 3 (three) times daily. E11.65   insulin aspart protamine- aspart  (70-30) 100 UNIT/ML injection Commonly known as:  NOVOLOG MIX 70/30 Inject 0.08 mLs (8 Units total) into the skin 2 (two) times daily with a meal.   Insulin Syringe-Needle U-100 25G X 1" 1 ML Misc Commonly known as:  B-D INSULIN SYRINGE 1CC/25GX1" Use as directed   midodrine 5 MG tablet Commonly known as:  PROAMATINE Take 1 tablet (5 mg total) by mouth 3 (three) times daily with meals.            Durable Medical Equipment        Start     Ordered   01/15/17 1121  For home use only DME 3 n 1  Once     01/15/17 1121  01/15/17 0819  For home use only DME Walker rolling  Once    Question:  Patient needs a walker to treat with the following condition  Answer:  Orthostatic hypotension   01/15/17 0818     Discharge Instructions: Please refer to Patient Instructions section of EMR for full details.  Patient was counseled important signs and symptoms that should prompt return to medical care, changes in medications, dietary instructions, activity restrictions, and follow up appointments.   Follow-Up Appointments: Follow-up [place] Follow up.   Why:  rolling walker to be delivered to bedside prior to discharge Contact information: [address] [place] [address] [phone]        [facility] Follow up.   Why:  home health services arranged, office will call and set up home visits Contact information: [place] [address] [phone]        [name], MD. Schedule an appointment as soon as possible for a visit in 1 week(s).   Specialty:  Family Medicine Contact information: [place] [address] [phone]          Lovenia Kim, MD 01/16/2017, 5:47 PM PGY-1, [place]

## 2017-01-16 NOTE — Progress Notes (Signed)
Patient discharged home and d/c instructions given with using interpreter. IV removed and patient verbalized understanding. Prescriptions at pharmacy. Insulin sent home with patient. No concerns or questions verbalized, Patient has wound leg. Iv removed and intact.

## 2017-01-16 NOTE — Progress Notes (Signed)
Family Medicine Teaching Service Daily Progress Note Intern Pager: [phone]  Patient name: Brian Owen Medical record number: [id number] Date of birth: 1950-12-18 Age: 66 y.o. Gender: male  Primary Care Provider: [name], MD Consultants: None Code Status: FULL   [name]:  Admit 3/18   Assessment and Plan: Brian Owen is a 66 y.o. male presenting with syncope at home, as well as hyperglycemia. PMH is significant for T2DM, orthostatic hypotension, CKDIII.    Syncope: History of similar episodes with multiple admissions for such where hydration and control of CBGs were recommended.  Echo in 05/2016 with EF 55-60% and normal wall motion.  BNP wnl, 15.  EKG without acute ischemic changes. CT head NAICA with chronic sinusitis. Orthostatic vitals positive in ED.  Repeat orthostatics after hydration were performed and patient exhibiting orthostatic hypotension. TSH 0.78 wnl.  A1c 15%.  Repeat echo on this admission with EF 60-65%.  Carotid dopplers negative.  AM cortisol 14, wnl. Patient denies dizziness this morning with no associated symptoms.  -Midodrine 2.5 mg TID started 3/20 in setting of orthostatic hypotension and plan to continue on discharge.  Patient hypotensive to 74/50 this AM with repeat BP 115/64.   Will increase Midodrine to 5 mg TID.   ?UTI  Urine culture with >100,000 colonies/ml enterococcus faecalis. Was present on previous admit as well and treated with Augmentin. Subsequent admit with +cx and not treated.  Patient denies any symptoms, will not treat with antibiotics.  Likely is colonization as present on multiple hospitalizations.   -Fall precautions   T2DM: CBG 459 on admission with normal AG.   Based on chart review, several episodes of hypoglycemia in 2017.  Home insulin was 8U 70/30 BID, but reportedly has not taken insulin in five months because he ran out.  A1c 15% on this admission.   -CBGs AC QHS - overnight 237, 158, 157.    -Transitioned to Novolin 70/30 yesterday with sSSI .  Will monitor CBGs on this regimen prior to discharge.  - Discussed with daughter.  Getting to follow up appointments is an barrier for them.  She understands when to not administer the insulin and is available at home to take care of him.  [facility] Nurse practitioner to provide follow up care at home.   Acute on Chronic Kidney Disease: Baseline Cr 1.3-1.4. Cr on presentation 2.2, and patient appearing clinically dehydrated.  - Cr this AM 1.78    -Monitor BMET   Leg ulcers: patient reports history of similar ulcers for 2 years. Saw PCP for these in the past with wound culture with MSSA and s/p course of bactrim. Followed with wound care in the past.  -Wound care consult  -ID consult for history of parasitic infection per chart review which he did not get surgery for >> discussed with Dr. Megan Salon.  Recommends outpatient follow up with biopsy.  Will make note of this on discharge.   Severe protein calorie malnutrition: Patient reports he only eats 2 meals per day. Family reports they have adequate food at home. Family also reports difficulty walking at home for >1 year. Most recent albumin low (2.9) on  08/28/2016 likely c/w malnutrition. Repeat albumin low on this admission, 3.1.  -Consult to nutrition placed, Glucerna and prostat supplements added  -PT/OT eval - PT rec HHPT and rolling walker with wheels and 3-in-1 bedside commode.  DME orders placed.   FEN/GI: carb modified diet   Prophylaxis: lovenox  Disposition: Anticipate discharge home  today. Insulin regimen discussed with daughter.    Subjective:  Patient states feeling well. Denies urinary symptoms and dizziness. [place] interpreters used to communicate as he speaks [place].  Is tolerating diet without nausea/vomiting.    Objective: Temp:  [97.9 F (36.6 C)-99 F (37.2 C)] 97.9 F (36.6 C) (03/21 0507) Pulse Rate:  [69-84] 69 (03/21 0526) Resp:  [16-18] 16 (03/21  0507) BP: (74-115)/(50-64) 115/64 (03/21 0526) SpO2:  [99 %-100 %] 99 % (03/21 0507) Weight:  [106 lb (48.1 kg)] 106 lb (48.1 kg) (03/21 0700)   Physical Exam:  General: Frail elderly male resting sitting in hospital chair eating breakfast, in NAD  Eyes: EOMI, PEERLA ENTM: MMM, oropharnyx normal Neck: supple Cardiovascular: RRR, no MRG  Respiratory: CTAB, easy WOB, no wheezing noted  Gastrointestinal: soft, NT, ND, +bs  MSK: moves all extremities spontaneously  Derm: 2 large LLE wounds,  nontender, no discharge noted  Psych: normal affect and mood   Laboratory:  Recent Labs Lab 01/14/17 0449 01/15/17 0520 01/16/17 0926  WBC 6.2 7.9 6.4  HGB 10.2* 10.0* 10.9*  HCT 30.7* 30.3* 33.3*  PLT 160 152 166    Recent Labs Lab 01/14/17 0449 01/15/17 0520 01/16/17 0926  NA 136 137 130*  K 4.2 3.5 4.4  CL 108 102 102  CO2 23 27 20*  BUN 20 21* 27*  CREATININE 1.73* 1.78* 1.63*  CALCIUM 8.8* 9.3 9.4  GLUCOSE 345* 118* 362*   Imaging/Diagnostic Tests: No results found.  Lovenia Kim, MD 01/16/2017, 2:05 PM PGY-1, [place] Intern pager: [phone], text pages welcome

## 2017-01-16 NOTE — Progress Notes (Signed)
Transitions of Care Pharmacy Note  Plan:  Educated on insulin dosing and timing, s/sx and treatment strategies for hypoglycemia, dosing of milrinone and avoidance close to bedtime.  Addressed concerns regarding insulin dose Recommend following up with detailed DM care outpatient  --------------------------------------------- Brian Owen is an 66 y.o. male who presents with a chief complaint orthostatic hypotension, hyperglycemia. In anticipation of discharge, pharmacy has reviewed this patient's prior to admission medication history, as well as current inpatient medications listed per the [facility].  Current medication indications, dosing, frequency, and notable side effects reviewed with patient and family. patient and family verbalized understanding of current inpatient medication regimen and is aware that the After Visit Summary when presented, will represent the most accurate medication list at discharge.   Brian Owen expressed concerns regarding updated insulin dose and treatment of hypoglycemic episodes.   Assessment: Understanding of regimen: good Understanding of indications: good Potential of compliance: good Barriers to Obtaining Medications: Yes > cost and language barrier  Patient instructed to contact inpatient pharmacy team with further questions or concerns if needed.    Time spent preparing for discharge counseling: 10 mins Time spent counseling patient: 20 mins   Thank you for allowing pharmacy to be a part of this patient's care.  Carlean Jews, Pharm.D. PGY1 Pharmacy Resident 3/21/20186:32 PM Pager [phone]

## 2017-01-17 ENCOUNTER — Ambulatory Visit: Payer: Self-pay

## 2017-01-18 ENCOUNTER — Other Ambulatory Visit: Payer: Self-pay | Admitting: Pharmacist

## 2017-01-18 ENCOUNTER — Other Ambulatory Visit: Payer: Self-pay

## 2017-01-18 NOTE — Patient Outreach (Signed)
Call Dr. Adrian Blackwater office and speak with Venetia Night. Request that the patient be called to schedule a follow up appointment and request that they send a new prescription for a glucometer to his pharmacy. Venetia Night states that they will follow up with Mr. Quevedo.  Will close pharmacy episode at this time.  Harlow Asa, PharmD, [place] Management [phone]

## 2017-01-18 NOTE — Patient Outreach (Signed)
Call placed to [place]  spoke with [place], [place] [phone]. HIPAA identifiers verified and verbal consent received.  Patient reports that he is still using the Novolog 70/30 pen that he received from the hospital. Reports that he has plenty left because he has only used a couple of doses from the pen. Let patient know that per the technician that I spoke with at his [place], his monthly copayment for this insulin will be $8.35. Patient reports that this copayment will be affordable to him.  Counsel patient on the importance of medication adherence. Patient verbalizes understanding and states that he wants to take his medications to avoid returning the the hospital. Patient reports that he does not currently have a post-discharge appointment with his PCP as he has not yet heard from his doctor's office about this appointment.   Ask patient about checking his blood sugar and patient reports that he has not been checking because he lost his blood glucose monitor when he moved.  Let Mr. Feeny know that I will call today to his PCP's office to request that they call the patient to schedule a post-discharge appointment and to request that they send a new prescription for a glucometer to the patient's pharmacy. Patient verbalizes understanding and expresses appreciation.  Mr. Rudden denies any further medication questions/concerns at this time. Patient confirms that he has my phone number.   Let Mr. Hallenbeck know that [place] will be reaching out to him this afternoon as well.  PLAN  1) Will call to follow up with patient's PCP to request that they call the patient to schedule a post-discharge appointment and to request that they send a new prescription for a glucometer to the patient's pharmacy.  2) Will place a coordination of care call to [facility]  3) Will plan to close pharmacy episode following reaching out to  patient's PCP.   Harlow Asa, PharmD, [place] Management [phone]

## 2017-01-18 NOTE — Patient Outreach (Signed)
Call to follow up with patient's [facility] pharmacy to confirm that patient's future copayment for his insulin. Pharmacy technician reports that patient's monthly copayment for Novolog 70/30 will be $8.35.  Harlow Asa, PharmD, [place] Management [phone]

## 2017-01-18 NOTE — Patient Outreach (Signed)
Call placed to [place]  spoke with [place], [place] [phone]. Reach patient's wife who is currently at the grocery store. Reports that she will not be back home with the patient until around 4 pm. Let her know that I will try back at that time.  Harlow Asa, PharmD, [place] Management [phone]

## 2017-01-18 NOTE — Patient Outreach (Signed)
Call placed to [place]  spoke with [place], [place] [phone]. [name] dials to speak with Mr. Shuler. HIPAA identifiers verified and verbal consent received.  Mr. Axel reports that he has his Novolog 70/30 insulin and is taking twice daily as directed. Reports that he picked this up from his [place] and that the cost was $3.36/month. Reports that he is able to afford this copayment. Patient denies any further pharmacy questions or concerns. Provide Mr. Vondrasek with my phone number.  Will call to follow up with patient's [place] to confirm that patient's future copayment for his insulin.  Harlow Asa, PharmD, [place] Management [phone]

## 2017-01-18 NOTE — Patient Outreach (Signed)
[facility]) Care Management  01/18/17  Ismael Malczewski July 02, 1951 [id number]  Call placed to [facility] line [phone]), spoke with [name], [place] ID # [phone] and had her place call to patient.  Successful outreach completed with patient via interpreter.  Patient was able to verbalize understanding of his hospitalization.  Patient reported that he has all of his prescriptions and has been taking them as prescribed.   Patient stated that he did not have any concerns or questions. Patient did report that he has family members who speak English that would be present at home visit next week. Home visit scheduled for next week.  [place] R. Shamanda Len, RN, BSN, [place] Management Coordinator [phone]

## 2017-01-18 NOTE — Patient Outreach (Signed)
[facility]) Care Management  01/18/17  Brian Owen 25-Nov-1950 [id number]  Call placed to [facility] line [phone]), spoke with Ninu, [place] Interpreter # [phone] and had her place call to patient. Instructed her to leave HIPAA compliant voicemail containing RNCM name, agency and callback number if goes to voicemail. Ninu placed RNCM on hold to call patient and after waiting approximately 5 minutes, the phone was disconnected.  RNCM contacted Grayland Ormond, pharmacist with [facility] who confirmed she had successfully contacted the patient earlier and that his wife has the phone and will not be home until after 4 pm today. RNCM to attempt to contact patient again later today.  [place] R. Gladie Gravette, RN, BSN, [place] [facility] Management Coordinator [phone]

## 2017-01-21 ENCOUNTER — Encounter: Payer: Self-pay | Admitting: Physician Assistant

## 2017-01-21 ENCOUNTER — Telehealth: Payer: Self-pay

## 2017-01-21 ENCOUNTER — Ambulatory Visit: Payer: Medicare Other | Attending: Internal Medicine | Admitting: Physician Assistant

## 2017-01-21 VITALS — BP 102/68 | HR 88 | Temp 97.4°F | Resp 16 | Wt 111.8 lb

## 2017-01-21 DIAGNOSIS — N183 Chronic kidney disease, stage 3 unspecified: Secondary | ICD-10-CM

## 2017-01-21 DIAGNOSIS — E1122 Type 2 diabetes mellitus with diabetic chronic kidney disease: Secondary | ICD-10-CM | POA: Diagnosis not present

## 2017-01-21 DIAGNOSIS — Z794 Long term (current) use of insulin: Secondary | ICD-10-CM

## 2017-01-21 DIAGNOSIS — I10 Essential (primary) hypertension: Secondary | ICD-10-CM

## 2017-01-21 DIAGNOSIS — L97919 Non-pressure chronic ulcer of unspecified part of right lower leg with unspecified severity: Secondary | ICD-10-CM

## 2017-01-21 DIAGNOSIS — L97909 Non-pressure chronic ulcer of unspecified part of unspecified lower leg with unspecified severity: Secondary | ICD-10-CM | POA: Insufficient documentation

## 2017-01-21 DIAGNOSIS — L97929 Non-pressure chronic ulcer of unspecified part of left lower leg with unspecified severity: Secondary | ICD-10-CM

## 2017-01-21 DIAGNOSIS — R55 Syncope and collapse: Secondary | ICD-10-CM

## 2017-01-21 DIAGNOSIS — Z8619 Personal history of other infectious and parasitic diseases: Secondary | ICD-10-CM | POA: Insufficient documentation

## 2017-01-21 DIAGNOSIS — Z09 Encounter for follow-up examination after completed treatment for conditions other than malignant neoplasm: Secondary | ICD-10-CM | POA: Insufficient documentation

## 2017-01-21 DIAGNOSIS — E119 Type 2 diabetes mellitus without complications: Secondary | ICD-10-CM

## 2017-01-21 DIAGNOSIS — E1165 Type 2 diabetes mellitus with hyperglycemia: Secondary | ICD-10-CM | POA: Diagnosis not present

## 2017-01-21 DIAGNOSIS — E118 Type 2 diabetes mellitus with unspecified complications: Secondary | ICD-10-CM | POA: Diagnosis not present

## 2017-01-21 DIAGNOSIS — Z7982 Long term (current) use of aspirin: Secondary | ICD-10-CM | POA: Insufficient documentation

## 2017-01-21 DIAGNOSIS — Z9119 Patient's noncompliance with other medical treatment and regimen: Secondary | ICD-10-CM | POA: Insufficient documentation

## 2017-01-21 DIAGNOSIS — IMO0002 Reserved for concepts with insufficient information to code with codable children: Secondary | ICD-10-CM

## 2017-01-21 LAB — GLUCOSE, POCT (MANUAL RESULT ENTRY)
POC GLUCOSE: 271 mg/dL — AB (ref 70–99)
POC Glucose: 340 mg/dl — AB (ref 70–99)

## 2017-01-21 MED ORDER — GLUCOSE BLOOD VI STRP: ORAL_STRIP | 12 refills | Status: DC

## 2017-01-21 MED ORDER — CLONIDINE HCL 0.1 MG PO TABS
0.2000 mg | ORAL_TABLET | Freq: Once | ORAL | Status: AC
  Administered 2017-01-21: 0.2 mg via ORAL

## 2017-01-21 MED ORDER — TRUE METRIX METER DEVI: 1.0000 | Freq: Four times a day (QID) | 0 refills | Status: DC

## 2017-01-21 MED ORDER — TRUEPLUS LANCETS 28G MISC: 28.0000 g | Freq: Four times a day (QID) | 2 refills | Status: DC

## 2017-01-21 NOTE — Progress Notes (Addendum)
Brian Owen  BBC:[id number]  HWT:[id number]  DOB - 1951-03-11  Chief Complaint  Patient presents with  . Hospitalization Follow-up       Subjective:   Brian Owen is a 66 y.o. male here today for hospital follow-up. He has a history of diabetes mellitus with hyper and hypoglycemia, slow healing ulcers of lower extremity, chronic kidney disease stage III, orthostasis and noncompliance. He presented to the emergency department on 01/13/2017 after a syncopal episode at home. He has had a cardiovascular workup in the past that was essentially normal. This included an echocardiogram, carotid Dopplers, EKGs and labs. He was orthostatic in the emergency department. His TSH was within normal limits. His cortisol level was within normal limits. A head CT was negative. He was treated with methadone Midodrine 2.5 mg TID and later increased to 5 mg 3 times daily.   He also had conversations around his chronic leg ulcers. The past she's been told that he needed to have them operated on and he refused. He has a history of MSSA and has been treated with antibiotics. Infectious disease was called and he recommended outpatient biopsy.  He also was noted to have an abnormal UA, this seems to be chronic. He has been treated in the past with Augmentin for the same species. It was believed that no antibiotics were needed due to lack of symptoms.  [facility] and DME. They have not received a call. He did not get any of the medications filled upon leaving the hospital. States that they do not have a diabetes monitor. He has been noncompliant with his diet. He chooses drinks sodas most of the day. He rarely drinks water. Does not like diet drinks. Has no new complaints today.   ROS: GEN: denies fever or chills, denies change in weight Skin: denies lesions or rashes HEENT: denies headache, earache, epistaxis, sore throat, or neck pain LUNGS: denies SHOB,  dyspnea, PND, orthopnea CV: denies CP or palpitations ABD: denies abd pain, N or V EXT: denies muscle spasms or swelling; no pain in lower ext, no weakness NEURO: denies numbness or tingling, denies sz, stroke or TIA  No problems updated.  ALLERGIES: No Known Allergies  PAST MEDICAL HISTORY: Past Medical History:  Diagnosis Date  . Bullae 11/11/2015   legs/notes 11/11/2015  . Chronic kidney disease   . Heavy cigarette smoker   . Parasite infection 2016   "couldn't afford to have surgery done; just left it; lower legs"  . Uncontrolled type II diabetes mellitus ([place]) dx'd 2013    PAST SURGICAL HISTORY: Past Surgical History:  Procedure Laterality Date  . NO PAST SURGERIES      MEDICATIONS AT HOME: Prior to Admission medications   Medication Sig Start Date End Date Taking? Authorizing Provider  ACCU-CHEK SOFTCLIX LANCETS lancets 1 each by Other route 3 (three) times daily. E11.65 Patient not taking: Reported on 08/27/2016 12/05/15   Boykin Nearing, MD  aspirin EC 81 MG EC tablet Take 1 tablet (81 mg total) by mouth daily. Patient not taking: Reported on 08/27/2016 11/29/15   Janece Canterbury, MD  Blood Glucose Monitoring Suppl (ACCU-CHEK AVIVA PLUS) w/Device KIT 1 Device by Does not apply route 3 (three) times daily after meals. E11.65 Patient not taking: Reported on 08/27/2016 12/05/15   Boykin Nearing, MD  Blood Glucose Monitoring Suppl (TRUE METRIX METER) DEVI 1 kit by Does not apply route 4 (four) times daily. 01/21/17   [place]  Ena Dawley, PA-C  camphor-menthol [facility]) lotion Apply 1 application topically as needed for itching. Patient not taking: Reported on 08/27/2016 02/21/16   Adriana Mccallum [name], MD  glucose blood (ACCU-CHEK AVIVA PLUS) test strip 1 each by Other route 3 (three) times daily. E11.65 Patient not taking: Reported on 01/13/2017 12/05/15   Boykin Nearing, MD  glucose blood (TRUE METRIX BLOOD GLUCOSE TEST) test strip Use as instructed 01/21/17   Brayton Caves, PA-C    insulin aspart protamine- aspart (NOVOLOG MIX 70/30) (70-30) 100 UNIT/ML injection Inject 0.08 mLs (8 Units total) into the skin 2 (two) times daily with a meal. 08/31/16   Josalyn [name], MD  Insulin Syringe-Needle U-100 (B-D INSULIN SYRINGE 1CC/25GX1") 25G X 1" 1 ML MISC Use as directed Patient not taking: Reported on 08/27/2016 07/13/16   Adriana Mccallum [name], MD  midodrine (PROAMATINE) 5 MG tablet Take 1 tablet (5 mg total) by mouth 3 (three) times daily with meals. 01/16/17   Lovenia Kim, MD  TRUEPLUS LANCETS 28G MISC 28 g by Does not apply route 4 (four) times daily. 01/21/17   Brayton Caves, PA-C    Family History  Problem Relation Age of Onset  . Diabetes Other   . Diabetes      [employer], nonsmoker, married  Objective:   Vitals:   01/21/17 1010 01/21/17 1058  BP: (!) 205/115 102/68  Pulse: 88   Resp: 16   Temp: 97.4 F (36.3 C)   TempSrc: Oral   SpO2: 100%   Weight: 111 lb 12.8 oz (50.7 kg)     Exam General appearance : Awake, alert, not in any distress. Speech Clear. Not toxic looking HEENT: Atraumatic and Normocephalic, pupils equally reactive to light and accomodation Neck: supple, no JVD. No cervical lymphadenopathy.  Chest:Good air entry bilaterally, no added sounds  CVS: S1 S2 regular, no murmurs.  Abdomen: Bowel sounds present, Non tender and not distended with no guarding, rigidity or rebound. Extremities: B/L Lower Ext shows no edema, both legs are warm to touch; slow healing ulcers and discoloration LLE Neurology: Awake alert, and oriented X 3, CN II-XII intact, Non focal Skin:No Rash Wounds:N/A  Data Review Lab Results  Component Value Date   HGBA1C 15.0 (H) 01/13/2017   HGBA1C 8.8 (H) 08/27/2016   HGBA1C 7.8 (H) 06/16/2016     Assessment & Plan  1. Syncope  -Orthostatic-slow ambulation/change positions  -offered TED hose  -adequate hydration and salt consumption 2. DM2-uncontrolled   -Increase Novolog back to 10 mg with  meals  -additional education offered today with a new meter  -encouraged compliance with diet 3. Ulcers LLE  -ID referral  -they have rec a bx 4. CKD stage 3  -better BS control 5. Noncompliance   Return in about 1 week (around 01/28/2017).  The patient was given clear instructions to go to ER or return to medical center if symptoms don't improve, worsen or new problems develop. The patient verbalized understanding. The patient was told to call to get lab results if they haven't heard anything in the next week.   Total time spent with patient was 28 min. Greater than 50 % of this visit was spent face to face counseling and coordinating care regarding risk factor modification, compliance importance and encouragement, education related to RF modification and medications.  I question there understanding/comprehension of the severity of his medical issues.   This note has been created with Surveyor, quantity. Any transcriptional errors are unintentional.  Zettie Pho, PA-C [facility] and [facility], [place]   01/21/2017, 11:20 AM

## 2017-01-21 NOTE — Telephone Encounter (Signed)
Request received from Zettie Pho, [place] inquiring about the status of the [facility] referral and RW for the patient.  Call placed to [facility], spoke to Quillian Quince, who stated that he did not see a referral for a RW but would contact the appropriate personnel at the hospital and then they would  contact the patient. The discharge summary noted that that a RW was ordered from [facility] and to be delivered to the patient's bedside prior to discharge.  Quillian Quince also stated that the patient is scheduled to be seen by RN and PT today.

## 2017-01-23 ENCOUNTER — Other Ambulatory Visit: Payer: Self-pay

## 2017-01-23 DIAGNOSIS — I129 Hypertensive chronic kidney disease with stage 1 through stage 4 chronic kidney disease, or unspecified chronic kidney disease: Secondary | ICD-10-CM | POA: Diagnosis not present

## 2017-01-23 DIAGNOSIS — Z794 Long term (current) use of insulin: Secondary | ICD-10-CM | POA: Diagnosis not present

## 2017-01-23 DIAGNOSIS — I951 Orthostatic hypotension: Secondary | ICD-10-CM | POA: Diagnosis not present

## 2017-01-23 DIAGNOSIS — E43 Unspecified severe protein-calorie malnutrition: Secondary | ICD-10-CM | POA: Diagnosis not present

## 2017-01-23 DIAGNOSIS — Z720 Tobacco use: Secondary | ICD-10-CM | POA: Diagnosis not present

## 2017-01-23 DIAGNOSIS — Z7984 Long term (current) use of oral hypoglycemic drugs: Secondary | ICD-10-CM | POA: Diagnosis not present

## 2017-01-23 DIAGNOSIS — Z87440 Personal history of urinary (tract) infections: Secondary | ICD-10-CM | POA: Diagnosis not present

## 2017-01-23 DIAGNOSIS — L97221 Non-pressure chronic ulcer of left calf limited to breakdown of skin: Secondary | ICD-10-CM | POA: Diagnosis not present

## 2017-01-23 DIAGNOSIS — N183 Chronic kidney disease, stage 3 (moderate): Secondary | ICD-10-CM | POA: Diagnosis not present

## 2017-01-23 DIAGNOSIS — E1143 Type 2 diabetes mellitus with diabetic autonomic (poly)neuropathy: Secondary | ICD-10-CM | POA: Diagnosis not present

## 2017-01-23 DIAGNOSIS — E1165 Type 2 diabetes mellitus with hyperglycemia: Secondary | ICD-10-CM | POA: Diagnosis not present

## 2017-01-23 DIAGNOSIS — Z9181 History of falling: Secondary | ICD-10-CM | POA: Diagnosis not present

## 2017-01-23 DIAGNOSIS — E1122 Type 2 diabetes mellitus with diabetic chronic kidney disease: Secondary | ICD-10-CM | POA: Diagnosis not present

## 2017-01-24 ENCOUNTER — Telehealth: Payer: Self-pay | Admitting: Family Medicine

## 2017-01-24 DIAGNOSIS — E1122 Type 2 diabetes mellitus with diabetic chronic kidney disease: Secondary | ICD-10-CM | POA: Diagnosis not present

## 2017-01-24 DIAGNOSIS — I129 Hypertensive chronic kidney disease with stage 1 through stage 4 chronic kidney disease, or unspecified chronic kidney disease: Secondary | ICD-10-CM | POA: Diagnosis not present

## 2017-01-24 DIAGNOSIS — L97221 Non-pressure chronic ulcer of left calf limited to breakdown of skin: Secondary | ICD-10-CM | POA: Diagnosis not present

## 2017-01-24 DIAGNOSIS — E1143 Type 2 diabetes mellitus with diabetic autonomic (poly)neuropathy: Secondary | ICD-10-CM | POA: Diagnosis not present

## 2017-01-24 DIAGNOSIS — N183 Chronic kidney disease, stage 3 (moderate): Secondary | ICD-10-CM | POA: Diagnosis not present

## 2017-01-24 DIAGNOSIS — E1165 Type 2 diabetes mellitus with hyperglycemia: Secondary | ICD-10-CM | POA: Diagnosis not present

## 2017-01-24 NOTE — Telephone Encounter (Signed)
Brian Owen was called and given verbal orders for PT.

## 2017-01-24 NOTE — Telephone Encounter (Signed)
Iris Pert from [facility] calling to request verbal orders for home care 1 time per week for 3 to 4 weeks. Please call at [phone] to follow up. Thank you.

## 2017-01-29 ENCOUNTER — Telehealth: Payer: Self-pay

## 2017-01-29 ENCOUNTER — Other Ambulatory Visit: Payer: Self-pay

## 2017-01-29 DIAGNOSIS — E1143 Type 2 diabetes mellitus with diabetic autonomic (poly)neuropathy: Secondary | ICD-10-CM | POA: Diagnosis not present

## 2017-01-29 DIAGNOSIS — N183 Chronic kidney disease, stage 3 (moderate): Secondary | ICD-10-CM | POA: Diagnosis not present

## 2017-01-29 DIAGNOSIS — E1165 Type 2 diabetes mellitus with hyperglycemia: Secondary | ICD-10-CM | POA: Diagnosis not present

## 2017-01-29 DIAGNOSIS — E1122 Type 2 diabetes mellitus with diabetic chronic kidney disease: Secondary | ICD-10-CM | POA: Diagnosis not present

## 2017-01-29 DIAGNOSIS — I129 Hypertensive chronic kidney disease with stage 1 through stage 4 chronic kidney disease, or unspecified chronic kidney disease: Secondary | ICD-10-CM | POA: Diagnosis not present

## 2017-01-29 DIAGNOSIS — L97221 Non-pressure chronic ulcer of left calf limited to breakdown of skin: Secondary | ICD-10-CM | POA: Diagnosis not present

## 2017-01-29 MED ORDER — INSULIN ASPART PROT & ASPART (70-30 MIX) 100 UNIT/ML ~~LOC~~ SUSP: 8.0000 [IU] | Freq: Two times a day (BID) | SUBCUTANEOUS | 0 refills | Status: DC

## 2017-01-29 MED ORDER — TRUEPLUS LANCETS 28G MISC: 28.0000 g | Freq: Four times a day (QID) | 12 refills | Status: DC

## 2017-01-29 MED ORDER — TRUE METRIX METER DEVI: 1.0000 | Freq: Four times a day (QID) | 0 refills | Status: DC

## 2017-01-29 MED ORDER — GLUCOSE BLOOD VI STRP: ORAL_STRIP | 12 refills | Status: DC

## 2017-01-29 NOTE — Telephone Encounter (Signed)
Cindy the home nurse contacted the office and is requesting a refill on patient insulin. Jenny Reichmann would like his insulin sent to our pharmacy

## 2017-01-29 NOTE — Telephone Encounter (Signed)
Nelia Shi the nurse aid. She had questions about his glucometer. Jenny Reichmann stated he got the wrong strips for his meter I informed her that [place] sent true meterix supplies to [employer]. I informed cindy that I can resend the supplies to our pharmacy. Also she wanted to schedule patient an appointment. Pt is schedule for April 12 @9am .

## 2017-01-29 NOTE — Telephone Encounter (Signed)
Medication was refilled and sent to pharmacy onsite.

## 2017-01-30 DIAGNOSIS — N183 Chronic kidney disease, stage 3 (moderate): Secondary | ICD-10-CM | POA: Diagnosis not present

## 2017-01-30 DIAGNOSIS — E1122 Type 2 diabetes mellitus with diabetic chronic kidney disease: Secondary | ICD-10-CM | POA: Diagnosis not present

## 2017-01-30 DIAGNOSIS — E1143 Type 2 diabetes mellitus with diabetic autonomic (poly)neuropathy: Secondary | ICD-10-CM | POA: Diagnosis not present

## 2017-01-30 DIAGNOSIS — E1165 Type 2 diabetes mellitus with hyperglycemia: Secondary | ICD-10-CM | POA: Diagnosis not present

## 2017-01-30 DIAGNOSIS — I129 Hypertensive chronic kidney disease with stage 1 through stage 4 chronic kidney disease, or unspecified chronic kidney disease: Secondary | ICD-10-CM | POA: Diagnosis not present

## 2017-01-30 DIAGNOSIS — L97221 Non-pressure chronic ulcer of left calf limited to breakdown of skin: Secondary | ICD-10-CM | POA: Diagnosis not present

## 2017-01-31 ENCOUNTER — Other Ambulatory Visit: Payer: Self-pay

## 2017-02-01 DIAGNOSIS — E1143 Type 2 diabetes mellitus with diabetic autonomic (poly)neuropathy: Secondary | ICD-10-CM | POA: Diagnosis not present

## 2017-02-01 DIAGNOSIS — E1122 Type 2 diabetes mellitus with diabetic chronic kidney disease: Secondary | ICD-10-CM | POA: Diagnosis not present

## 2017-02-01 DIAGNOSIS — N183 Chronic kidney disease, stage 3 (moderate): Secondary | ICD-10-CM | POA: Diagnosis not present

## 2017-02-01 DIAGNOSIS — E1165 Type 2 diabetes mellitus with hyperglycemia: Secondary | ICD-10-CM | POA: Diagnosis not present

## 2017-02-01 DIAGNOSIS — I129 Hypertensive chronic kidney disease with stage 1 through stage 4 chronic kidney disease, or unspecified chronic kidney disease: Secondary | ICD-10-CM | POA: Diagnosis not present

## 2017-02-01 DIAGNOSIS — L97221 Non-pressure chronic ulcer of left calf limited to breakdown of skin: Secondary | ICD-10-CM | POA: Diagnosis not present

## 2017-02-04 DIAGNOSIS — E1143 Type 2 diabetes mellitus with diabetic autonomic (poly)neuropathy: Secondary | ICD-10-CM | POA: Diagnosis not present

## 2017-02-04 DIAGNOSIS — E1122 Type 2 diabetes mellitus with diabetic chronic kidney disease: Secondary | ICD-10-CM | POA: Diagnosis not present

## 2017-02-04 DIAGNOSIS — L97221 Non-pressure chronic ulcer of left calf limited to breakdown of skin: Secondary | ICD-10-CM | POA: Diagnosis not present

## 2017-02-04 DIAGNOSIS — I129 Hypertensive chronic kidney disease with stage 1 through stage 4 chronic kidney disease, or unspecified chronic kidney disease: Secondary | ICD-10-CM | POA: Diagnosis not present

## 2017-02-04 DIAGNOSIS — E1165 Type 2 diabetes mellitus with hyperglycemia: Secondary | ICD-10-CM | POA: Diagnosis not present

## 2017-02-04 DIAGNOSIS — N183 Chronic kidney disease, stage 3 (moderate): Secondary | ICD-10-CM | POA: Diagnosis not present

## 2017-02-06 DIAGNOSIS — E1143 Type 2 diabetes mellitus with diabetic autonomic (poly)neuropathy: Secondary | ICD-10-CM | POA: Diagnosis not present

## 2017-02-06 DIAGNOSIS — L97221 Non-pressure chronic ulcer of left calf limited to breakdown of skin: Secondary | ICD-10-CM | POA: Diagnosis not present

## 2017-02-06 DIAGNOSIS — N183 Chronic kidney disease, stage 3 (moderate): Secondary | ICD-10-CM | POA: Diagnosis not present

## 2017-02-06 DIAGNOSIS — E1165 Type 2 diabetes mellitus with hyperglycemia: Secondary | ICD-10-CM | POA: Diagnosis not present

## 2017-02-06 DIAGNOSIS — I129 Hypertensive chronic kidney disease with stage 1 through stage 4 chronic kidney disease, or unspecified chronic kidney disease: Secondary | ICD-10-CM | POA: Diagnosis not present

## 2017-02-06 DIAGNOSIS — E1122 Type 2 diabetes mellitus with diabetic chronic kidney disease: Secondary | ICD-10-CM | POA: Diagnosis not present

## 2017-02-07 ENCOUNTER — Ambulatory Visit: Payer: Self-pay | Admitting: Family Medicine

## 2017-02-08 DIAGNOSIS — E1165 Type 2 diabetes mellitus with hyperglycemia: Secondary | ICD-10-CM | POA: Diagnosis not present

## 2017-02-08 DIAGNOSIS — L97221 Non-pressure chronic ulcer of left calf limited to breakdown of skin: Secondary | ICD-10-CM | POA: Diagnosis not present

## 2017-02-08 DIAGNOSIS — E1122 Type 2 diabetes mellitus with diabetic chronic kidney disease: Secondary | ICD-10-CM | POA: Diagnosis not present

## 2017-02-08 DIAGNOSIS — E1143 Type 2 diabetes mellitus with diabetic autonomic (poly)neuropathy: Secondary | ICD-10-CM | POA: Diagnosis not present

## 2017-02-08 DIAGNOSIS — N183 Chronic kidney disease, stage 3 (moderate): Secondary | ICD-10-CM | POA: Diagnosis not present

## 2017-02-08 DIAGNOSIS — I129 Hypertensive chronic kidney disease with stage 1 through stage 4 chronic kidney disease, or unspecified chronic kidney disease: Secondary | ICD-10-CM | POA: Diagnosis not present

## 2017-02-10 ENCOUNTER — Emergency Department (HOSPITAL_COMMUNITY): Payer: Medicare Other

## 2017-02-10 ENCOUNTER — Encounter (HOSPITAL_COMMUNITY): Payer: Self-pay | Admitting: Emergency Medicine

## 2017-02-10 ENCOUNTER — Emergency Department (HOSPITAL_COMMUNITY)
Admission: EM | Admit: 2017-02-10 | Discharge: 2017-02-10 | Disposition: A | Discharge status: Home / Self Care | Payer: Medicare Other | Attending: Emergency Medicine | Admitting: Emergency Medicine

## 2017-02-10 DIAGNOSIS — E1143 Type 2 diabetes mellitus with diabetic autonomic (poly)neuropathy: Secondary | ICD-10-CM | POA: Diagnosis not present

## 2017-02-10 DIAGNOSIS — R531 Weakness: Secondary | ICD-10-CM

## 2017-02-10 DIAGNOSIS — Z87891 Personal history of nicotine dependence: Secondary | ICD-10-CM | POA: Diagnosis not present

## 2017-02-10 DIAGNOSIS — N189 Chronic kidney disease, unspecified: Secondary | ICD-10-CM | POA: Diagnosis not present

## 2017-02-10 DIAGNOSIS — E1165 Type 2 diabetes mellitus with hyperglycemia: Secondary | ICD-10-CM | POA: Diagnosis not present

## 2017-02-10 DIAGNOSIS — Z7982 Long term (current) use of aspirin: Secondary | ICD-10-CM | POA: Insufficient documentation

## 2017-02-10 DIAGNOSIS — Z794 Long term (current) use of insulin: Secondary | ICD-10-CM | POA: Diagnosis not present

## 2017-02-10 DIAGNOSIS — N390 Urinary tract infection, site not specified: Secondary | ICD-10-CM | POA: Insufficient documentation

## 2017-02-10 DIAGNOSIS — I129 Hypertensive chronic kidney disease with stage 1 through stage 4 chronic kidney disease, or unspecified chronic kidney disease: Secondary | ICD-10-CM | POA: Diagnosis not present

## 2017-02-10 DIAGNOSIS — Z79899 Other long term (current) drug therapy: Secondary | ICD-10-CM | POA: Diagnosis not present

## 2017-02-10 DIAGNOSIS — R55 Syncope and collapse: Secondary | ICD-10-CM | POA: Diagnosis not present

## 2017-02-10 DIAGNOSIS — R42 Dizziness and giddiness: Secondary | ICD-10-CM | POA: Diagnosis not present

## 2017-02-10 DIAGNOSIS — R410 Disorientation, unspecified: Secondary | ICD-10-CM | POA: Diagnosis not present

## 2017-02-10 DIAGNOSIS — R404 Transient alteration of awareness: Secondary | ICD-10-CM | POA: Diagnosis not present

## 2017-02-10 DIAGNOSIS — E1122 Type 2 diabetes mellitus with diabetic chronic kidney disease: Secondary | ICD-10-CM | POA: Insufficient documentation

## 2017-02-10 DIAGNOSIS — R739 Hyperglycemia, unspecified: Secondary | ICD-10-CM

## 2017-02-10 LAB — URINALYSIS, ROUTINE W REFLEX MICROSCOPIC
BILIRUBIN URINE: NEGATIVE
Glucose, UA: >=500 mg/dL — AB
KETONES UR: NEGATIVE mg/dL
Nitrite: NEGATIVE
PH: 6 (ref 5.0–8.0)
PROTEIN: 30 mg/dL — AB
SQUAMOUS EPITHELIAL / LPF: NONE SEEN
Specific Gravity, Urine: 1.01 (ref 1.005–1.030)

## 2017-02-10 LAB — COMPREHENSIVE METABOLIC PANEL
ALK PHOS: 166 U/L — AB (ref 38–126)
ALT: 26 U/L (ref 17–63)
AST: 21 U/L (ref 15–41)
Albumin: 3.1 g/dL — ABNORMAL LOW (ref 3.5–5.0)
Anion gap: 9 (ref 5–15)
BUN: 23 mg/dL — AB (ref 6–20)
CALCIUM: 8.8 mg/dL — AB (ref 8.9–10.3)
CHLORIDE: 96 mmol/L — AB (ref 101–111)
CO2: 26 mmol/L (ref 22–32)
CREATININE: 1.54 mg/dL — AB (ref 0.61–1.24)
GFR calc non Af Amer: 45 mL/min — ABNORMAL LOW (ref >60)
GFR, EST AFRICAN AMERICAN: 53 mL/min — AB (ref >60)
GLUCOSE: 316 mg/dL — AB (ref 65–99)
Potassium: 3.7 mmol/L (ref 3.5–5.1)
SODIUM: 131 mmol/L — AB (ref 135–145)
Total Bilirubin: 0.6 mg/dL (ref 0.3–1.2)
Total Protein: 7.4 g/dL (ref 6.5–8.1)

## 2017-02-10 LAB — CBC
HCT: 32.1 % — ABNORMAL LOW (ref 39.0–52.0)
HEMOGLOBIN: 10.7 g/dL — AB (ref 13.0–17.0)
MCH: 29.6 pg (ref 26.0–34.0)
MCHC: 33.3 g/dL (ref 30.0–36.0)
MCV: 88.7 fL (ref 78.0–100.0)
Platelets: 216 10*3/uL (ref 150–400)
RBC: 3.62 MIL/uL — ABNORMAL LOW (ref 4.22–5.81)
RDW: 13 % (ref 11.5–15.5)
WBC: 5.9 10*3/uL (ref 4.0–10.5)

## 2017-02-10 LAB — I-STAT TROPONIN, ED: Troponin i, poc: 0 ng/mL (ref 0.00–0.08)

## 2017-02-10 LAB — CBG MONITORING, ED: Glucose-Capillary: 183 mg/dL — ABNORMAL HIGH (ref 65–99)

## 2017-02-10 MED ORDER — SODIUM CHLORIDE 0.9 % IV BOLUS (SEPSIS)
500.0000 mL | Freq: Once | INTRAVENOUS | Status: AC
  Administered 2017-02-10: 500 mL via INTRAVENOUS

## 2017-02-10 MED ORDER — INSULIN ASPART 100 UNIT/ML ~~LOC~~ SOLN
8.0000 [IU] | Freq: Once | SUBCUTANEOUS | Status: AC
  Administered 2017-02-10: 8 [IU] via SUBCUTANEOUS
  Filled 2017-02-10: qty 1

## 2017-02-10 MED ORDER — LEVOFLOXACIN IN D5W 500 MG/100ML IV SOLN
500.0000 mg | Freq: Once | INTRAVENOUS | Status: AC
  Administered 2017-02-10: 500 mg via INTRAVENOUS
  Filled 2017-02-10: qty 100

## 2017-02-10 MED ORDER — LEVOFLOXACIN 250 MG PO TABS: 250.0000 mg | ORAL_TABLET | Freq: Every day | ORAL | 0 refills | Status: DC

## 2017-02-10 MED ORDER — SODIUM CHLORIDE 0.9 % IV BOLUS (SEPSIS)
1000.0000 mL | Freq: Once | INTRAVENOUS | Status: AC
  Administered 2017-02-10: 1000 mL via INTRAVENOUS

## 2017-02-10 NOTE — ED Notes (Signed)
Interpreter line used for triage and assessments.

## 2017-02-10 NOTE — ED Notes (Signed)
Patient transported to X-ray 

## 2017-02-10 NOTE — ED Provider Notes (Signed)
[facility] DEPT Provider Note   CSN: [id number] Arrival date & time: 02/10/17  1542     History   Chief Complaint Chief Complaint  Patient presents with  . Weakness    HPI Christifer Ciesla is a 66 y.o. male.  Patient w hx iddm, presents c/o generalized weakness for the past few days. Symptoms moderate, persistent, worse today.  Decreased appetite, nausea. No abdominal pain. +frontal headache for past couple days, constant, dull. No neck pain or stiffness. No cough or uri c/o. No fever or chills. Denies dysuria.    The history is provided by the patient.  Weakness  Associated symptoms include headaches. Pertinent negatives include no shortness of breath, no chest pain and no confusion.    Past Medical History:  Diagnosis Date  . Bullae 11/11/2015   legs/notes 11/11/2015  . Chronic kidney disease   . Heavy cigarette smoker   . Parasite infection 2016   "couldn't afford to have surgery done; just left it; lower legs"  . Uncontrolled type II diabetes mellitus ([place]) dx'd 2013    Patient Active Problem List   Diagnosis Date Noted  . Syncope 01/13/2017  . Postural dizziness with presyncope   . AKI (acute kidney injury) ([place])   . Orthostatic hypotension 06/17/2016  . CKD (chronic kidney disease) 06/16/2016  . Underweight 01/17/2016  . Stage IV skin ulcer ([place]) 12/05/2015  . Autonomic neuropathy due to diabetes ([place]) 11/29/2015  . C. difficile diarrhea 11/29/2015  . Hyperglycemia 11/27/2015  . Vertigo 11/27/2015  . Orthostatic hypotension 11/27/2015  . SVT (supraventricular tachycardia) ([place]) 11/11/2015  . Protein-calorie malnutrition, severe ([place]) 07/26/2014  . History of tobacco use 06/27/2014  . Edentulism, complete 06/27/2014  . Language barrier to communication 06/27/2014  . HTN (hypertension) 06/27/2014  . Hyponatremia 05/13/2014  . Diabetes mellitus type 2, uncontrolled ([place]) 05/13/2014    Past Surgical History:  Procedure Laterality Date  . NO PAST SURGERIES          Home Medications    Prior to Admission medications   Medication Sig Start Date End Date Taking? Authorizing Provider  ACCU-CHEK SOFTCLIX LANCETS lancets 1 each by Other route 3 (three) times daily. E11.65 Patient not taking: Reported on 08/27/2016 12/05/15   Boykin Nearing, MD  aspirin EC 81 MG EC tablet Take 1 tablet (81 mg total) by mouth daily. Patient not taking: Reported on 08/27/2016 11/29/15   Janece Canterbury, MD  Blood Glucose Monitoring Suppl (ACCU-CHEK AVIVA PLUS) w/Device KIT 1 Device by Does not apply route 3 (three) times daily after meals. E11.65 12/05/15   Boykin Nearing, MD  Blood Glucose Monitoring Suppl (TRUE METRIX METER) DEVI 1 kit by Does not apply route 4 (four) times daily. 01/29/17   Tiffany Daneil Dan, PA-C  camphor-menthol [facility]) lotion Apply 1 application topically as needed for itching. Patient not taking: Reported on 08/27/2016 02/21/16   Adriana Mccallum [name], MD  glucose blood (ACCU-CHEK AVIVA PLUS) test strip 1 each by Other route 3 (three) times daily. E11.65 Patient not taking: Reported on 01/13/2017 12/05/15   Boykin Nearing, MD  glucose blood (TRUE METRIX BLOOD GLUCOSE TEST) test strip Use as instructed 01/29/17   Brayton Caves, PA-C  insulin aspart protamine- aspart (NOVOLOG MIX 70/30) (70-30) 100 UNIT/ML injection Inject 0.08 mLs (8 Units total) into the skin 2 (two) times daily with a meal. 01/29/17   Josalyn [name], MD  Insulin Syringe-Needle U-100 (B-D INSULIN SYRINGE 1CC/25GX1") 25G X 1" 1 ML MISC Use as directed Patient not taking: Reported  on 08/27/2016 07/13/16   Adriana Mccallum [name], MD  midodrine (PROAMATINE) 5 MG tablet Take 1 tablet (5 mg total) by mouth 3 (three) times daily with meals. 01/16/17   Lovenia Kim, MD  TRUEPLUS LANCETS 28G MISC 28 g by Does not apply route 4 (four) times daily. 01/29/17   Brayton Caves, PA-C    Family History Family History  Problem Relation Age of Onset  . Diabetes Other   . Diabetes      Social History Social History    Substance Use Topics  . Smoking status: Former Smoker    Packs/day: 1.50    Years: 48.00    Types: Cigarettes    Quit date: 2015  . Smokeless tobacco: Never Used  . Alcohol use No     Allergies   Patient has no known allergies.   Review of Systems Review of Systems  Constitutional: Negative for fever.  HENT: Negative for sore throat.   Eyes: Negative for redness.  Respiratory: Negative for cough and shortness of breath.   Cardiovascular: Negative for chest pain and leg swelling.  Gastrointestinal: Positive for nausea. Negative for abdominal pain.  Genitourinary: Negative for flank pain.  Musculoskeletal: Negative for back pain and neck pain.  Skin: Negative for rash.  Neurological: Positive for weakness and headaches.  Hematological: Does not bruise/bleed easily.  Psychiatric/Behavioral: Negative for confusion.     Physical Exam Updated Vital Signs BP (!) 158/92   Pulse 97   Resp 15   SpO2 98%   Physical Exam  Constitutional: He appears well-developed and well-nourished. No distress.  HENT:  Mouth/Throat: Oropharynx is clear and moist.  No sinus or temporal tenderness  Eyes: Conjunctivae are normal. Pupils are equal, round, and reactive to light.  Neck: Neck supple. No tracheal deviation present. No thyromegaly present.  No stiffness or rigidity  Cardiovascular: Normal rate, regular rhythm, normal heart sounds and intact distal pulses.  Exam reveals no gallop and no friction rub.   No murmur heard. Pulmonary/Chest: Effort normal and breath sounds normal. No accessory muscle usage. No respiratory distress.  Abdominal: Soft. Bowel sounds are normal. He exhibits no distension and no mass. There is no tenderness. There is no rebound and no guarding. No hernia.  Genitourinary:  Genitourinary Comments: No cva tenderness  Musculoskeletal: He exhibits no edema or tenderness.  Neurological: He is alert.  Skin: Skin is warm and dry. No rash noted. He is not diaphoretic.   Psychiatric: He has a normal mood and affect.  Nursing note and vitals reviewed.    ED Treatments / Results  Labs (all labs ordered are listed, but only abnormal results are displayed)   Results for orders placed or performed during the hospital encounter of 02/10/17  CBC  Result Value Ref Range   WBC 5.9 4.0 - 10.5 K/uL   RBC 3.62 (L) 4.22 - 5.81 MIL/uL   Hemoglobin 10.7 (L) 13.0 - 17.0 g/dL   HCT 32.1 (L) 39.0 - 52.0 %   MCV 88.7 78.0 - 100.0 fL   MCH 29.6 26.0 - 34.0 pg   MCHC 33.3 30.0 - 36.0 g/dL   RDW 13.0 11.5 - 15.5 %   Platelets 216 150 - 400 K/uL  Comprehensive metabolic panel  Result Value Ref Range   Sodium 131 (L) 135 - 145 mmol/L   Potassium 3.7 3.5 - 5.1 mmol/L   Chloride 96 (L) 101 - 111 mmol/L   CO2 26 22 - 32 mmol/L   Glucose, Bld 316 (H) 65 -  99 mg/dL   BUN 23 (H) 6 - 20 mg/dL   Creatinine, Ser 1.54 (H) 0.61 - 1.24 mg/dL   Calcium 8.8 (L) 8.9 - 10.3 mg/dL   Total Protein 7.4 6.5 - 8.1 g/dL   Albumin 3.1 (L) 3.5 - 5.0 g/dL   AST 21 15 - 41 U/L   ALT 26 17 - 63 U/L   Alkaline Phosphatase 166 (H) 38 - 126 U/L   Total Bilirubin 0.6 0.3 - 1.2 mg/dL   GFR calc non Af Amer 45 (L) >60 mL/min   GFR calc Af Amer 53 (L) >60 mL/min   Anion gap 9 5 - 15  Urinalysis, Routine w reflex microscopic  Result Value Ref Range   Color, Urine YELLOW YELLOW   APPearance HAZY (A) CLEAR   Specific Gravity, Urine 1.010 1.005 - 1.030   pH 6.0 5.0 - 8.0   Glucose, UA >=500 (A) NEGATIVE mg/dL   Hgb urine dipstick SMALL (A) NEGATIVE   Bilirubin Urine NEGATIVE NEGATIVE   Ketones, ur NEGATIVE NEGATIVE mg/dL   Protein, ur 30 (A) NEGATIVE mg/dL   Nitrite NEGATIVE NEGATIVE   Leukocytes, UA LARGE (A) NEGATIVE   RBC / HPF 0-5 0 - 5 RBC/hpf   WBC, UA TOO NUMEROUS TO COUNT 0 - 5 WBC/hpf   Bacteria, UA RARE (A) NONE SEEN   Squamous Epithelial / LPF NONE SEEN NONE SEEN   Budding Yeast PRESENT   I-stat troponin, ED  Result Value Ref Range   Troponin i, poc 0.00 0.00 - 0.08  ng/mL   Comment 3          POC CBG, ED  Result Value Ref Range   Glucose-Capillary 183 (H) 65 - 99 mg/dL   Dg Chest 2 View  Result Date: 02/10/2017 CLINICAL DATA:  Weakness and dizziness with syncopal episode EXAM: CHEST  2 VIEW COMPARISON:  01/13/2017 FINDINGS: There is no focal pulmonary infiltrate, consolidation, or pleural effusion. The cardiomediastinal silhouette is stable with atherosclerosis. There is no pneumothorax. Stable mild kyphosis of the lower spine with mild wedging deformities. IMPRESSION: No active cardiopulmonary disease. Electronically Signed   By: Donavan Foil M.D.   On: 02/10/2017 18:27   Dg Chest 2 View  Result Date: 01/13/2017 CLINICAL DATA:  Syncope. EXAM: CHEST  2 VIEW COMPARISON:  08/28/2016. FINDINGS: Normal sized heart. Clear lungs. Aortic arch calcifications. Unremarkable bones. IMPRESSION: No acute abnormality.  Aortic atherosclerosis.  This Electronically Signed   By: Claudie Revering M.D.   On: 01/13/2017 13:39   Ct Head Wo Contrast  Result Date: 02/10/2017 CLINICAL DATA:  Confusion and dizziness EXAM: CT HEAD WITHOUT CONTRAST TECHNIQUE: Contiguous axial images were obtained from the base of the skull through the vertex without intravenous contrast. COMPARISON:  01/13/2017 FINDINGS: Brain: Mild atrophic changes are again identified and stable. Hemorrhage, acute infarction or space-occupying mass lesion is noted. Vascular: No hyperdense vessel or unexpected calcification. Skull: Normal. Negative for fracture or focal lesion. Sinuses/Orbits: Mild mucosal thickening is noted in the ethmoid sinuses stable from the prior exam. Other: None. IMPRESSION: Atrophic changes without acute intracranial abnormality. Chronic sinus changes stable from the prior study. Electronically Signed   By: Inez Catalina M.D.   On: 02/10/2017 18:35   Ct Head Wo Contrast  Result Date: 01/13/2017 CLINICAL DATA:  Syncope and dizziness. EXAM: CT HEAD WITHOUT CONTRAST TECHNIQUE: Contiguous axial  images were obtained from the base of the skull through the vertex without intravenous contrast. COMPARISON:  08/27/2016 FINDINGS: Brain: No evidence  of acute infarction, hemorrhage, hydrocephalus, extra-axial collection or mass lesion/mass effect. Mild brain parenchymal volume loss. Vascular: Calcific atherosclerotic disease at the skullbase. Skull: Normal. Negative for fracture or focal lesion. Sinuses/Orbits: Partial opacification of bilateral ethmoid and left maxillary sinuses. Other: None. IMPRESSION: No acute intracranial abnormality. Mild brain parenchymal volume loss. Intracranial calcific atherosclerotic disease. Chronic sinusitis. Electronically Signed   By: Fidela Salisbury M.D.   On: 01/13/2017 16:45     EKG  EKG Interpretation None       Radiology Dg Chest 2 View  Result Date: 02/10/2017 CLINICAL DATA:  Weakness and dizziness with syncopal episode EXAM: CHEST  2 VIEW COMPARISON:  01/13/2017 FINDINGS: There is no focal pulmonary infiltrate, consolidation, or pleural effusion. The cardiomediastinal silhouette is stable with atherosclerosis. There is no pneumothorax. Stable mild kyphosis of the lower spine with mild wedging deformities. IMPRESSION: No active cardiopulmonary disease. Electronically Signed   By: Donavan Foil M.D.   On: 02/10/2017 18:27   Ct Head Wo Contrast  Result Date: 02/10/2017 CLINICAL DATA:  Confusion and dizziness EXAM: CT HEAD WITHOUT CONTRAST TECHNIQUE: Contiguous axial images were obtained from the base of the skull through the vertex without intravenous contrast. COMPARISON:  01/13/2017 FINDINGS: Brain: Mild atrophic changes are again identified and stable. Hemorrhage, acute infarction or space-occupying mass lesion is noted. Vascular: No hyperdense vessel or unexpected calcification. Skull: Normal. Negative for fracture or focal lesion. Sinuses/Orbits: Mild mucosal thickening is noted in the ethmoid sinuses stable from the prior exam. Other: None.  IMPRESSION: Atrophic changes without acute intracranial abnormality. Chronic sinus changes stable from the prior study. Electronically Signed   By: Inez Catalina M.D.   On: 02/10/2017 18:35    Procedures Procedures (including critical care time)  Medications Ordered in ED Medications  sodium chloride 0.9 % bolus 500 mL (not administered)     Initial Impression / Assessment and Plan / ED Course  I have reviewed the triage vital signs and the nursing notes.  Pertinent labs & imaging results that were available during my care of the patient were reviewed by me and considered in my medical decision making (see chart for details).  Iv ns bolus. Labs.   Reviewed nursing notes and prior charts for additional history.   novolog sq.  Glucose initially high - improved on recheck.  uti on labs. ?colonization vs acute uti as prior u cxs positive - given symptoms/weakness, will treat.  Recheck, tolerating po. No pain, no distress.  Patient indicates feels ready for d/c.     Final Clinical Impressions(s) / ED Diagnoses   Final diagnoses:  None    New Prescriptions New Prescriptions   No medications on file     Lajean Saver, MD 02/10/17 2047

## 2017-02-10 NOTE — Discharge Instructions (Signed)
It was our pleasure to provide your ER care today - we hope that you feel better.  Rest. Drink plenty of fluids.  Take antibiotic as prescribed.  Take your diabetes medication and follow diabetes eating instructions.  Follow up with your primary care doctor in the coming week.  Return to ER if worse, new symptoms, fevers, trouble breathing, other concern.

## 2017-02-10 NOTE — ED Triage Notes (Signed)
Pt BIB EMS from home for several syncopal episodes and is intermittently confused at baseline; denies falling, per EMS pt endorses feeling dizzy and lowers self to floor. Denies hitting head or LOC. Pt had an episode of incontinence prior to ems arrival. Per ems pt orthostatic postitive, pt received 750 cc. Pt speaks [place]. resp e/u; nad noted at this time.

## 2017-02-11 ENCOUNTER — Encounter: Payer: Self-pay | Admitting: Family Medicine

## 2017-02-11 ENCOUNTER — Ambulatory Visit: Payer: Medicare Other | Attending: Family Medicine | Admitting: Family Medicine

## 2017-02-11 VITALS — BP 142/80 | HR 86 | Temp 98.5°F | Ht 64.0 in | Wt 112.6 lb

## 2017-02-11 DIAGNOSIS — S81802S Unspecified open wound, left lower leg, sequela: Secondary | ICD-10-CM | POA: Diagnosis not present

## 2017-02-11 DIAGNOSIS — Z79899 Other long term (current) drug therapy: Secondary | ICD-10-CM | POA: Diagnosis not present

## 2017-02-11 DIAGNOSIS — Z9114 Patient's other noncompliance with medication regimen: Secondary | ICD-10-CM | POA: Diagnosis not present

## 2017-02-11 DIAGNOSIS — Z7982 Long term (current) use of aspirin: Secondary | ICD-10-CM | POA: Insufficient documentation

## 2017-02-11 DIAGNOSIS — B952 Enterococcus as the cause of diseases classified elsewhere: Secondary | ICD-10-CM

## 2017-02-11 DIAGNOSIS — E1122 Type 2 diabetes mellitus with diabetic chronic kidney disease: Secondary | ICD-10-CM | POA: Diagnosis not present

## 2017-02-11 DIAGNOSIS — N183 Chronic kidney disease, stage 3 (moderate): Secondary | ICD-10-CM | POA: Diagnosis not present

## 2017-02-11 DIAGNOSIS — Z87891 Personal history of nicotine dependence: Secondary | ICD-10-CM | POA: Insufficient documentation

## 2017-02-11 DIAGNOSIS — I129 Hypertensive chronic kidney disease with stage 1 through stage 4 chronic kidney disease, or unspecified chronic kidney disease: Secondary | ICD-10-CM | POA: Diagnosis not present

## 2017-02-11 DIAGNOSIS — E1100 Type 2 diabetes mellitus with hyperosmolarity without nonketotic hyperglycemic-hyperosmolar coma (NKHHC): Secondary | ICD-10-CM | POA: Insufficient documentation

## 2017-02-11 DIAGNOSIS — Z794 Long term (current) use of insulin: Secondary | ICD-10-CM

## 2017-02-11 DIAGNOSIS — N390 Urinary tract infection, site not specified: Secondary | ICD-10-CM | POA: Diagnosis not present

## 2017-02-11 DIAGNOSIS — N3000 Acute cystitis without hematuria: Secondary | ICD-10-CM | POA: Diagnosis not present

## 2017-02-11 DIAGNOSIS — S81802A Unspecified open wound, left lower leg, initial encounter: Secondary | ICD-10-CM | POA: Insufficient documentation

## 2017-02-11 LAB — GLUCOSE, POCT (MANUAL RESULT ENTRY)
POC Glucose: 357 mg/dl — AB (ref 70–99)
POC Glucose: 407 mg/dl — AB (ref 70–99)

## 2017-02-11 MED ORDER — GLIMEPIRIDE 2 MG PO TABS: 2.0000 mg | ORAL_TABLET | Freq: Every day | ORAL | 3 refills | Status: DC

## 2017-02-11 MED ORDER — INSULIN ASPART 100 UNIT/ML ~~LOC~~ SOLN: 20.0000 [IU] | Freq: Once | SUBCUTANEOUS | Status: DC

## 2017-02-11 NOTE — Patient Instructions (Addendum)
Brian Owen was seen today for follow-up.  Diagnoses and all orders for this visit:  Uncontrolled type 2 diabetes mellitus with hyperosmolarity without coma, with long-term current use of insulin (HCC) -     Glucose (CBG) -     glimepiride (AMARYL) 2 MG tablet; Take 1 tablet (2 mg total) by mouth daily before breakfast. -     Ambulatory referral to Endocrinology  Wound of left lower extremity, sequela -     AMB referral to wound care center   f/u in this week with clinical pharmacologist and bring insulin for injection teaching  f/u in 3 weeks with me for diabetes  Dr. Adrian Blackwater

## 2017-02-11 NOTE — Patient Outreach (Signed)
[facility]) Care Management  01/31/2017  Greig Lysne 08-19-1951 [id number]  Attempted to reach patient without success. Phone rang without answer and no option to leave a message.   [place] R. Mikela Senn, RN, BSN, [place] Management Coordinator [phone]

## 2017-02-11 NOTE — Progress Notes (Signed)
Subjective:  Patient ID: Brian Owen, male    DOB: 1951-09-04  Age: 66 y.o. MRN: [id number]  CC: Follow-up (syncope)   HPI Brian Owen has uncontrolled diabetes, labile CGBs, CKD stage 3,  HTN, hx of C.Diff diarrhea,  medication non-compliance he presents for    1. ED f/u weakness: he was treated in [facility] ED yesterday (02/10/17) for generalized weakness for the past 3-4 days associated with nausea and decreased appetite. Also with frontal headache. He denies fever, chills, emesis and abdominal pain. Laboratory evaluation revealed slight normocytic anemia with H/H 10.7/32.1,  Hyperglycemia with blood sugar of 316, negative I stat troponin, UA with > 500 glucose, small Hgb, 30 potein, large LE, TNTC WBC. Urine culture is pending. Of note he has history of vancomycin sensitive enterococcus faecalis in urine (01/2016, 05/2016,  07/2016, 12/2016). CXR: no pulmonary infiltrate, consolidation or pleural effusion. CT head: IMPRESSION:  Atrophic changes without acute intracranial abnormality. Chronic sinus changes stable from the prior study.  He was treated IV fluids bolus. novolog 8 U subcutaneous. Repeat blood sugar 183. Prescribed levaquin for suspected UTI. Discharged to home.  Today he reports continued headache. Denies fever or chills. Denies abdominal pain. He has not yet started prescribed Levaquin.    2. f/u syncope: he was hospitalized on 01/13/17 following syncopal episode at home. See last OV note for summary of hospitalization. He denies recurrent syncope. No chest pain or shortness of breath.  3. Uncontrolled diabetes: he is non-compliant with insulin therapy at home. He and his family request pill to control his blood sugars. They report being afraid of needles.   4. Leg wounds: painless chronic scars on L leg. No recent injury. No swelling. Scar at left knee has been draining but remains non tender. He has been treated by wound care for wounds on his arm and leg in the past.   Social  History  Substance Use Topics  . Smoking status: Former Smoker    Packs/day: 1.50    Years: 48.00    Types: Cigarettes    Quit date: 2015  . Smokeless tobacco: Never Used  . Alcohol use No    Outpatient Medications Prior to Visit  Medication Sig Dispense Refill  . ACCU-CHEK SOFTCLIX LANCETS lancets 1 each by Other route 3 (three) times daily. E11.65 100 each 12  . aspirin EC 81 MG EC tablet Take 1 tablet (81 mg total) by mouth daily. 90 tablet 0  . Blood Glucose Monitoring Suppl (ACCU-CHEK AVIVA PLUS) w/Device KIT 1 Device by Does not apply route 3 (three) times daily after meals. E11.65 1 kit 0  . Blood Glucose Monitoring Suppl (TRUE METRIX METER) DEVI 1 kit by Does not apply route 4 (four) times daily. 1 Device 0  . glucose blood (ACCU-CHEK AVIVA PLUS) test strip 1 each by Other route 3 (three) times daily. E11.65 100 each 12  . glucose blood (TRUE METRIX BLOOD GLUCOSE TEST) test strip Use as instructed 100 each 12  . insulin aspart protamine- aspart (NOVOLOG MIX 70/30) (70-30) 100 UNIT/ML injection Inject 0.08 mLs (8 Units total) into the skin 2 (two) times daily with a meal. 10 mL 0  . Insulin Syringe-Needle U-100 (B-D INSULIN SYRINGE 1CC/25GX1") 25G X 1" 1 ML MISC Use as directed 100 each 0  . levofloxacin (LEVAQUIN) 250 MG tablet Take 1 tablet (250 mg total) by mouth daily. 5 tablet 0  . TRUEPLUS LANCETS 28G MISC 28 g by Does not apply route 4 (four) times daily. [place]  each 12  . camphor-menthol (SARNA) lotion Apply 1 application topically as needed for itching. (Patient not taking: Reported on 02/11/2017) 222 mL 5  . midodrine (PROAMATINE) 5 MG tablet Take 1 tablet (5 mg total) by mouth 3 (three) times daily with meals. 90 tablet 2   No facility-administered medications prior to visit.     ROS Review of Systems  Constitutional: Positive for appetite change. Negative for chills, fatigue, fever and unexpected weight change.  Eyes: Negative for visual disturbance.  Respiratory:  Negative for cough and shortness of breath.   Cardiovascular: Negative for chest pain, palpitations and leg swelling.  Gastrointestinal: Negative for abdominal pain, blood in stool, constipation, diarrhea, nausea and vomiting.  Endocrine: Negative for polydipsia, polyphagia and polyuria.  Musculoskeletal: Negative for arthralgias, back pain, gait problem, myalgias and neck pain.  Skin: Positive for wound. Negative for rash.  Allergic/Immunologic: Negative for immunocompromised state.  Neurological: Positive for headaches.  Hematological: Negative for adenopathy. Does not bruise/bleed easily.  Psychiatric/Behavioral: Negative for dysphoric mood, sleep disturbance and suicidal ideas. The patient is not nervous/anxious.     Objective:  BP (!) 142/80   Pulse 86   Temp 98.5 F (36.9 C) (Oral)   Ht '5\' 4"'  (1.626 m)   Wt 112 lb 9.6 oz (51.1 kg)   SpO2 97%   BMI 19.33 kg/m   BP/Weight 02/11/2017 02/10/2017 6/33/3545  Systolic BP 625 638 937  Diastolic BP 80 78 68  Wt. (Lbs) 112.6 - 111.8  BMI 19.33 - 19.19    Physical Exam  Constitutional: He appears well-developed and well-nourished. No distress.  HENT:  Head: Normocephalic and atraumatic.  Neck: Normal range of motion. Neck supple.  Cardiovascular: Normal rate, regular rhythm, normal heart sounds and intact distal pulses.   Pulses:      Radial pulses are 2+ on the right side, and 2+ on the left side.       Posterior tibial pulses are 1+ on the right side, and 1+ on the left side.  Pulmonary/Chest: Effort normal and breath sounds normal.  Abdominal: Soft. Bowel sounds are normal. He exhibits no distension and no mass. There is no tenderness. There is no rebound and no guarding.  Musculoskeletal: He exhibits no edema.  Neurological: He is alert.  Skin: Skin is warm and dry. No rash noted. No erythema.     Psychiatric: He has a normal mood and affect.   Lab Results  Component Value Date   HGBA1C 15.0 (H) 01/13/2017   CBG  407  Treated with 20 U of novolog Repeat CBG 357   Assessment & Plan:  Brian Owen was seen today for follow-up.  Diagnoses and all orders for this visit:  Uncontrolled type 2 diabetes mellitus with hyperosmolarity without coma, with long-term current use of insulin (HCC) -     Glucose (CBG) -     glimepiride (AMARYL) 2 MG tablet; Take 1 tablet (2 mg total) by mouth daily before breakfast. -     Ambulatory referral to Endocrinology -     insulin aspart (novoLOG) injection 20 Units; Inject 0.2 mLs (20 Units total) into the skin once. -     Glucose (CBG)  Wound of left lower extremity, sequela -     AMB referral to wound care center  Acute cystitis without hematuria  UTI (urinary tract infection) due to Enterococcus -     Ambulatory referral to Urology   There are no diagnoses linked to this encounter.  No orders of the defined  types were placed in this encounter.   Follow-up: Return in about 3 weeks (around 03/04/2017) for diabetes .   Boykin Nearing MD

## 2017-02-12 ENCOUNTER — Encounter: Payer: Self-pay | Admitting: Family Medicine

## 2017-02-12 NOTE — Assessment & Plan Note (Signed)
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 L knee eschar peeling with serous drainage P: Wound care referral placed for treatment prevent ulceration

## 2017-02-12 NOTE — Assessment & Plan Note (Addendum)
A: recent UA with evidence of UTI, review of urine cultures reveals recurrent enterococcus UTI  P: Patient to start levaquin course Will f/u urine culture results Urology referral placed

## 2017-02-12 NOTE — Assessment & Plan Note (Signed)
A: uncontrolled diabetes with hyperglycemia due to non compliance with home insulin therapy. P: Add amary 2 mg daily before breakfast Endocrinology referral for possible Afreeza therapy Close f/u in clinic for insulin injection teaching

## 2017-02-13 DIAGNOSIS — I129 Hypertensive chronic kidney disease with stage 1 through stage 4 chronic kidney disease, or unspecified chronic kidney disease: Secondary | ICD-10-CM | POA: Diagnosis not present

## 2017-02-13 DIAGNOSIS — E1165 Type 2 diabetes mellitus with hyperglycemia: Secondary | ICD-10-CM | POA: Diagnosis not present

## 2017-02-13 DIAGNOSIS — N183 Chronic kidney disease, stage 3 (moderate): Secondary | ICD-10-CM | POA: Diagnosis not present

## 2017-02-13 DIAGNOSIS — E1143 Type 2 diabetes mellitus with diabetic autonomic (poly)neuropathy: Secondary | ICD-10-CM | POA: Diagnosis not present

## 2017-02-13 DIAGNOSIS — E1122 Type 2 diabetes mellitus with diabetic chronic kidney disease: Secondary | ICD-10-CM | POA: Diagnosis not present

## 2017-02-13 DIAGNOSIS — L97221 Non-pressure chronic ulcer of left calf limited to breakdown of skin: Secondary | ICD-10-CM | POA: Diagnosis not present

## 2017-02-13 LAB — URINE CULTURE: Culture: 40000 — AB

## 2017-02-14 ENCOUNTER — Telehealth: Payer: Self-pay | Admitting: *Deleted

## 2017-02-14 DIAGNOSIS — L97221 Non-pressure chronic ulcer of left calf limited to breakdown of skin: Secondary | ICD-10-CM | POA: Diagnosis not present

## 2017-02-14 DIAGNOSIS — E1122 Type 2 diabetes mellitus with diabetic chronic kidney disease: Secondary | ICD-10-CM | POA: Diagnosis not present

## 2017-02-14 DIAGNOSIS — I129 Hypertensive chronic kidney disease with stage 1 through stage 4 chronic kidney disease, or unspecified chronic kidney disease: Secondary | ICD-10-CM | POA: Diagnosis not present

## 2017-02-14 DIAGNOSIS — E1143 Type 2 diabetes mellitus with diabetic autonomic (poly)neuropathy: Secondary | ICD-10-CM | POA: Diagnosis not present

## 2017-02-14 DIAGNOSIS — E1165 Type 2 diabetes mellitus with hyperglycemia: Secondary | ICD-10-CM | POA: Diagnosis not present

## 2017-02-14 DIAGNOSIS — N183 Chronic kidney disease, stage 3 (moderate): Secondary | ICD-10-CM | POA: Diagnosis not present

## 2017-02-14 NOTE — Patient Outreach (Signed)
[facility]) Care Management  [place]  01/23/2017   Diallo Pacitti [birth year] [id number]  Subjective: Initial home visit completed with patient and his granddaughter.   Objective:   Encounter Medications:  Outpatient Encounter Prescriptions as of 01/23/2017  Medication Sig  . midodrine (PROAMATINE) 5 MG tablet Take 1 tablet (5 mg total) by mouth 3 (three) times daily with meals.  . [DISCONTINUED] insulin aspart protamine- aspart (NOVOLOG MIX 70/30) (70-30) 100 UNIT/ML injection Inject 0.08 mLs (8 Units total) into the skin 2 (two) times daily with a meal.  . ACCU-CHEK SOFTCLIX LANCETS lancets 1 each by Other route 3 (three) times daily. E11.65  . aspirin EC 81 MG EC tablet Take 1 tablet (81 mg total) by mouth daily.  . Blood Glucose Monitoring Suppl (ACCU-CHEK AVIVA PLUS) w/Device KIT 1 Device by Does not apply route 3 (three) times daily after meals. E11.65  . camphor-menthol (SARNA) lotion Apply 1 application topically as needed for itching. (Patient not taking: Reported on 02/11/2017)  . glucose blood (ACCU-CHEK AVIVA PLUS) test strip 1 each by Other route 3 (three) times daily. E11.65  . Insulin Syringe-Needle U-100 (B-D INSULIN SYRINGE 1CC/25GX1") 25G X 1" 1 ML MISC Use as directed  . [DISCONTINUED] Blood Glucose Monitoring Suppl (TRUE METRIX METER) DEVI 1 kit by Does not apply route 4 (four) times daily.  . [DISCONTINUED] glucose blood (TRUE METRIX BLOOD GLUCOSE TEST) test strip Use as instructed  . [DISCONTINUED] TRUEPLUS LANCETS 28G MISC 28 g by Does not apply route 4 (four) times daily.   No facility-administered encounter medications on file as of 01/23/2017.     Functional Status:  In your present state of health, do you have any difficulty performing the following activities: 01/23/2017 08/27/2016  Hearing? N N  Vision? Y Y  Difficulty concentrating or making decisions? N N  Walking or climbing stairs? Y Y  Dressing or bathing? N N  Doing errands,  shopping? N Y  Conservation officer, nature and eating ? N -  Using the Toilet? N -  In the past six months, have you accidently leaked urine? N -  Do you have problems with loss of bowel control? N -  Managing your Medications? Y -  Managing your Finances? Y -  Housekeeping or managing your Housekeeping? N -  Some recent data might be hidden    Fall/Depression Screening: Fall Risk  01/23/2017 07/25/2015 06/23/2015  Falls in the past year? No No No   PHQ 2/9 Scores 02/11/2017 01/23/2017 01/21/2017 12/05/2015 07/25/2015 06/23/2015 08/18/2014  PHQ - 2 Score 1 0 2 2 0 3 0  PHQ- 9 Score 5 - 14 17 - - -    Assessment:  Patient's granddaughter was present for home visit. She is assisting patient with medications and will be checking blood sugars and giving insulin. She stated that she went to pick up patient's glucometer and when they got home, it would not work. Upon assessment, patient's test strips were not correct ones for the meter patient was given. She stated that she had talked with the pharmacy and was told all she had to do was bring the glucometer back and they would give her the correct test strips. RNCM educated patient and granddaughter regarding importance of checking his blood sugars and the need to pick up the testing supplies so that they can do this. Patient's granddaughter stated she planned to go to pharmacy today to pick up.  Patient has his insulin and his granddaughter stated that  she has been giving it to him twice a day with meals. Discussed meals briefly with patient and his granddaughter. Culturally, his diet consists of rice daily. RNCM attempted to address carbohydrates, including rice and patient was not receptive at present time. His granddaughter stated that he eats rice with nearly every meal and that this was at times, the largest part of the meal.   He showed RNCM old healed scars on right leg, but has several large chronic wounds on left leg with eschar. His granddaughter stated that  he has been told he may need surgery, but he is afraid and did not want surgery. He stated that he feels they will heal as the others did. Patient has large scars on legs form old wounds that have healed and patient stated that they healed on their own. He is currently not treating the wounds and does not see wound care center for his wounds, although per history it looks as if he has been in the past. Wounds are currently dry and not draining and not covered.   RNCM provided education about importance of checking his blood sugars and documenting them, as well as taking all medications as prescribed. Also provided education about signs and symptoms of hypoglycemia and hyperglycemia. Patient's daughter verbalized understanding, Unable to assess blood sugars since home due to not having a glucometer, but granddaughter to pick up supplies this afternoon. RNCM contact information provided and encouraged to call with any questions or concerns.   Plan:  Will continue to follow for TOC and provide diabetes education. Will need to follow up regarding blood sugar readings once patient is able to check them.   [place] R. Rusell Meneely, RN, BSN, [place] Management Coordinator [phone]

## 2017-02-14 NOTE — Telephone Encounter (Signed)
Post ED Visit - Positive Culture Follow-up  Culture report reviewed by antimicrobial stewardship pharmacist:  []  Elenor Quinones, Pharm.D. []  Heide Guile, Pharm.D., BCPS AQ-ID []  Parks Neptune, Pharm.D., BCPS []  Alycia Rossetti, Pharm.D., BCPS []  [place], [place].D., BCPS, AAHIVP []  Legrand Como, Pharm.D., BCPS, AAHIVP []  Salome Arnt, PharmD, BCPS []  Dimitri Ped, PharmD, BCPS []  Vincenza Hews, PharmD, BCPS K. Lacinda Axon, Pharm D  Positive urine culture Treated with Levofloxacin, organism sensitive to the same and no further patient follow-up is required at this time.  Harlon Flor [facility] 02/14/2017, 9:57 AM

## 2017-02-15 ENCOUNTER — Telehealth: Payer: Self-pay | Admitting: Family Medicine

## 2017-02-15 NOTE — Telephone Encounter (Signed)
Social worker called in behave of Patient since she when to check the patient blood sugar, she was unable to do it since patient has Accu chek meter and patient does NOT have the strips for this meter, she ask if we can sent an order of the strips to the [employer].. Please floow up with patient.. thanks

## 2017-02-18 ENCOUNTER — Other Ambulatory Visit: Payer: Self-pay

## 2017-02-18 NOTE — Patient Outreach (Signed)
[facility]) Care Management  02/18/17  Domonick Ternes 10-Jun-1951  [id number]  Call placed to [facility] line [phone]), spoke with [place], [place] ID # [id number]   and had her place call to patient.  Successful outreach completed with patient. Patient identity verified.  Patient stated that he has been doing fine. He reported that he has been walking a little bit better. He does have an appointment on Wednesday 4/25 at the wound care center for his wounds. This will be his first appointment. RNCM to attend this visit with patient to assist with understanding and assistance with plan of care.   Patient stated that he does not have his supplies yet for his glucometer. His daughter was supposed to pick up new test strips at the pharmacy, but unable to determine what happened. Patient has not been checking his blood sugars. Verified patient does have an appointment tomorrow for diabetic education. Patient stated that he needed RNCM to reschedule the appointment because he cannot go at that time. RNCM advised that she cannot change his appointment but that we will have to go through his provider's office to do that. RNCM encouraged patient to contact them asap if he needs to cancel it because it is less than 24 hours til his appointment. Educated that it is important to attend this appointment. Patient stated he would call to reschedule - he needs to have it earlier in the day.  Plan: RNCM will perform visit with patient this week at [place].  [place] R. Nusrat Encarnacion, RN, BSN, [place] Management Coordinator [phone]

## 2017-02-19 ENCOUNTER — Other Ambulatory Visit: Payer: Self-pay

## 2017-02-19 ENCOUNTER — Ambulatory Visit: Payer: Medicare Other | Attending: Family Medicine | Admitting: Pharmacist

## 2017-02-19 DIAGNOSIS — E1100 Type 2 diabetes mellitus with hyperosmolarity without nonketotic hyperglycemic-hyperosmolar coma (NKHHC): Secondary | ICD-10-CM | POA: Diagnosis not present

## 2017-02-19 DIAGNOSIS — Z794 Long term (current) use of insulin: Secondary | ICD-10-CM

## 2017-02-19 DIAGNOSIS — E119 Type 2 diabetes mellitus without complications: Secondary | ICD-10-CM | POA: Insufficient documentation

## 2017-02-19 NOTE — Progress Notes (Signed)
    S:     Chief Complaint  Patient presents with  . Medication Management    Patient arrives in good spirits.  Presents for insulin. Patient was referred on 02/11/17.  Patient was last seen by Primary Care Provider on 02/11/17.   Patient is prescribed 70/30 insulin 8 units BID with meals. He has not started it yet because he was afraid of the needle because he isn't sure how to use it.   O:  Physical Exam   ROS   Lab Results  Component Value Date   HGBA1C 15.0 (H) 01/13/2017   There were no vitals filed for this visit.   A/P: Diabetes longstanding currently uncontrolled based on A1c of 15. Patient was prescribed insulin and needed injection technique education. Patient educated with syringe and vial and was able to demonstrate appropriate use. Patient to start with his insulin today.  Written patient instructions provided.  Total time in face to face counseling 20 minutes.  Patient seen with Maryan Char, PharmD Candidate. Patient will follow up with Dr. Adrian Blackwater.

## 2017-02-19 NOTE — Patient Instructions (Signed)
Thanks for coming to see [place]!  Follow up with Dr. Adrian Blackwater

## 2017-02-20 ENCOUNTER — Encounter (HOSPITAL_BASED_OUTPATIENT_CLINIC_OR_DEPARTMENT_OTHER): Payer: Medicare Other | Attending: Surgery

## 2017-02-20 ENCOUNTER — Other Ambulatory Visit: Payer: Self-pay | Admitting: Surgery

## 2017-02-20 ENCOUNTER — Other Ambulatory Visit: Payer: Self-pay

## 2017-02-20 ENCOUNTER — Ambulatory Visit (HOSPITAL_COMMUNITY)
Admission: RE | Admit: 2017-02-20 | Discharge: 2017-02-20 | Disposition: A | Discharge status: Home / Self Care | Payer: Medicare Other | Source: Ambulatory Visit | Attending: Surgery | Admitting: Surgery

## 2017-02-20 DIAGNOSIS — Z7982 Long term (current) use of aspirin: Secondary | ICD-10-CM | POA: Diagnosis not present

## 2017-02-20 DIAGNOSIS — F0390 Unspecified dementia without behavioral disturbance: Secondary | ICD-10-CM | POA: Insufficient documentation

## 2017-02-20 DIAGNOSIS — I120 Hypertensive chronic kidney disease with stage 5 chronic kidney disease or end stage renal disease: Secondary | ICD-10-CM | POA: Insufficient documentation

## 2017-02-20 DIAGNOSIS — E1100 Type 2 diabetes mellitus with hyperosmolarity without nonketotic hyperglycemic-hyperosmolar coma (NKHHC): Secondary | ICD-10-CM

## 2017-02-20 DIAGNOSIS — E11622 Type 2 diabetes mellitus with other skin ulcer: Secondary | ICD-10-CM | POA: Diagnosis present

## 2017-02-20 DIAGNOSIS — N186 End stage renal disease: Secondary | ICD-10-CM | POA: Insufficient documentation

## 2017-02-20 DIAGNOSIS — E1122 Type 2 diabetes mellitus with diabetic chronic kidney disease: Secondary | ICD-10-CM | POA: Diagnosis not present

## 2017-02-20 DIAGNOSIS — M86172 Other acute osteomyelitis, left ankle and foot: Secondary | ICD-10-CM

## 2017-02-20 DIAGNOSIS — F17218 Nicotine dependence, cigarettes, with other nicotine-induced disorders: Secondary | ICD-10-CM | POA: Diagnosis not present

## 2017-02-20 DIAGNOSIS — Z79899 Other long term (current) drug therapy: Secondary | ICD-10-CM | POA: Diagnosis not present

## 2017-02-20 DIAGNOSIS — L97829 Non-pressure chronic ulcer of other part of left lower leg with unspecified severity: Secondary | ICD-10-CM | POA: Diagnosis not present

## 2017-02-20 DIAGNOSIS — S81802A Unspecified open wound, left lower leg, initial encounter: Secondary | ICD-10-CM | POA: Diagnosis not present

## 2017-02-20 DIAGNOSIS — L97222 Non-pressure chronic ulcer of left calf with fat layer exposed: Secondary | ICD-10-CM | POA: Diagnosis not present

## 2017-02-20 DIAGNOSIS — Z794 Long term (current) use of insulin: Principal | ICD-10-CM

## 2017-02-20 DIAGNOSIS — L97822 Non-pressure chronic ulcer of other part of left lower leg with fat layer exposed: Secondary | ICD-10-CM | POA: Insufficient documentation

## 2017-02-20 DIAGNOSIS — F17210 Nicotine dependence, cigarettes, uncomplicated: Secondary | ICD-10-CM | POA: Insufficient documentation

## 2017-02-20 DIAGNOSIS — I700 Atherosclerosis of aorta: Secondary | ICD-10-CM | POA: Diagnosis not present

## 2017-02-20 MED ORDER — GLUCOSE BLOOD VI STRP: 1.0000 | ORAL_STRIP | Freq: Three times a day (TID) | 12 refills | Status: DC

## 2017-02-20 NOTE — Telephone Encounter (Signed)
Strips have been refilled and sent to [facility].

## 2017-02-21 DIAGNOSIS — N183 Chronic kidney disease, stage 3 (moderate): Secondary | ICD-10-CM | POA: Diagnosis not present

## 2017-02-21 DIAGNOSIS — I129 Hypertensive chronic kidney disease with stage 1 through stage 4 chronic kidney disease, or unspecified chronic kidney disease: Secondary | ICD-10-CM | POA: Diagnosis not present

## 2017-02-21 DIAGNOSIS — E1165 Type 2 diabetes mellitus with hyperglycemia: Secondary | ICD-10-CM | POA: Diagnosis not present

## 2017-02-21 DIAGNOSIS — L97221 Non-pressure chronic ulcer of left calf limited to breakdown of skin: Secondary | ICD-10-CM | POA: Diagnosis not present

## 2017-02-21 DIAGNOSIS — E1143 Type 2 diabetes mellitus with diabetic autonomic (poly)neuropathy: Secondary | ICD-10-CM | POA: Diagnosis not present

## 2017-02-21 DIAGNOSIS — E1122 Type 2 diabetes mellitus with diabetic chronic kidney disease: Secondary | ICD-10-CM | POA: Diagnosis not present

## 2017-02-21 NOTE — Telephone Encounter (Signed)
[facility] Levada Dy) called, the strip that was sent is not cover by the insurance and acoording to her PT did not been checking his blood sugar level for long time, she want to know if the PT can come to the office and just get his blood sugar level check also she Levada Dy) want to call her back at [phone] please follow with her

## 2017-02-22 ENCOUNTER — Encounter: Payer: Self-pay | Admitting: Endocrinology

## 2017-02-22 NOTE — Patient Outreach (Signed)
[facility]) Care Management  Late entry for 02/20/2017  Tanor Glaspy 1951-08-20 [id number]  Patient had appointment today at [place] at 8:00 am and [facility] and patient had agreed to meet at this appointment.  RNCM arrived at patient's appointment. However, patient was a no-show for the appointment. Spoke with receptionist, Mendel Ryder who stated that she had called and talked with patient's daughter yesterday, 4/24 for appointment reminder and confirmed he would be there. He has not called today to cancel.  Update 9:15 am: Received voicemail from [place] at [facility] stating that patient showed up an hour late. Because they had another cancellation, they were going to go ahead and see him. RNCM returned call around 9:30 am and spoke with Mendel Ryder. Requested that she send patient's record of visit so that RNCM could assist with his plan of care and provided RNCM contact and fax number.  [place] R. Maddock Finigan, RN, BSN, [place] Management Coordinator [phone]

## 2017-02-27 ENCOUNTER — Encounter (HOSPITAL_BASED_OUTPATIENT_CLINIC_OR_DEPARTMENT_OTHER): Payer: Medicare Other | Attending: Surgery

## 2017-02-27 DIAGNOSIS — L97122 Non-pressure chronic ulcer of left thigh with fat layer exposed: Secondary | ICD-10-CM | POA: Insufficient documentation

## 2017-02-27 DIAGNOSIS — E1143 Type 2 diabetes mellitus with diabetic autonomic (poly)neuropathy: Secondary | ICD-10-CM | POA: Diagnosis not present

## 2017-02-27 DIAGNOSIS — E1165 Type 2 diabetes mellitus with hyperglycemia: Secondary | ICD-10-CM | POA: Diagnosis not present

## 2017-02-27 DIAGNOSIS — I129 Hypertensive chronic kidney disease with stage 1 through stage 4 chronic kidney disease, or unspecified chronic kidney disease: Secondary | ICD-10-CM | POA: Diagnosis not present

## 2017-02-27 DIAGNOSIS — L97221 Non-pressure chronic ulcer of left calf limited to breakdown of skin: Secondary | ICD-10-CM | POA: Diagnosis not present

## 2017-02-27 DIAGNOSIS — N183 Chronic kidney disease, stage 3 (moderate): Secondary | ICD-10-CM | POA: Insufficient documentation

## 2017-02-27 DIAGNOSIS — E11622 Type 2 diabetes mellitus with other skin ulcer: Secondary | ICD-10-CM | POA: Insufficient documentation

## 2017-02-27 DIAGNOSIS — E1122 Type 2 diabetes mellitus with diabetic chronic kidney disease: Secondary | ICD-10-CM | POA: Diagnosis not present

## 2017-02-27 DIAGNOSIS — L97222 Non-pressure chronic ulcer of left calf with fat layer exposed: Secondary | ICD-10-CM | POA: Insufficient documentation

## 2017-02-27 DIAGNOSIS — F0390 Unspecified dementia without behavioral disturbance: Secondary | ICD-10-CM | POA: Insufficient documentation

## 2017-02-27 DIAGNOSIS — F17218 Nicotine dependence, cigarettes, with other nicotine-induced disorders: Secondary | ICD-10-CM | POA: Insufficient documentation

## 2017-03-06 DIAGNOSIS — F17218 Nicotine dependence, cigarettes, with other nicotine-induced disorders: Secondary | ICD-10-CM | POA: Diagnosis not present

## 2017-03-06 DIAGNOSIS — E1122 Type 2 diabetes mellitus with diabetic chronic kidney disease: Secondary | ICD-10-CM | POA: Diagnosis not present

## 2017-03-06 DIAGNOSIS — I129 Hypertensive chronic kidney disease with stage 1 through stage 4 chronic kidney disease, or unspecified chronic kidney disease: Secondary | ICD-10-CM | POA: Diagnosis not present

## 2017-03-06 DIAGNOSIS — E11622 Type 2 diabetes mellitus with other skin ulcer: Secondary | ICD-10-CM | POA: Diagnosis not present

## 2017-03-06 DIAGNOSIS — F17200 Nicotine dependence, unspecified, uncomplicated: Secondary | ICD-10-CM | POA: Diagnosis not present

## 2017-03-06 DIAGNOSIS — E1143 Type 2 diabetes mellitus with diabetic autonomic (poly)neuropathy: Secondary | ICD-10-CM | POA: Diagnosis not present

## 2017-03-06 DIAGNOSIS — L97222 Non-pressure chronic ulcer of left calf with fat layer exposed: Secondary | ICD-10-CM | POA: Diagnosis not present

## 2017-03-06 DIAGNOSIS — L97122 Non-pressure chronic ulcer of left thigh with fat layer exposed: Secondary | ICD-10-CM | POA: Diagnosis not present

## 2017-03-06 DIAGNOSIS — L97829 Non-pressure chronic ulcer of other part of left lower leg with unspecified severity: Secondary | ICD-10-CM | POA: Diagnosis not present

## 2017-03-06 DIAGNOSIS — F0390 Unspecified dementia without behavioral disturbance: Secondary | ICD-10-CM | POA: Diagnosis not present

## 2017-03-06 DIAGNOSIS — L97221 Non-pressure chronic ulcer of left calf limited to breakdown of skin: Secondary | ICD-10-CM | POA: Diagnosis not present

## 2017-03-06 DIAGNOSIS — E1165 Type 2 diabetes mellitus with hyperglycemia: Secondary | ICD-10-CM | POA: Diagnosis not present

## 2017-03-06 DIAGNOSIS — N183 Chronic kidney disease, stage 3 (moderate): Secondary | ICD-10-CM | POA: Diagnosis not present

## 2017-03-07 ENCOUNTER — Ambulatory Visit (HOSPITAL_BASED_OUTPATIENT_CLINIC_OR_DEPARTMENT_OTHER): Payer: Medicare Other | Admitting: Family Medicine

## 2017-03-07 ENCOUNTER — Encounter (HOSPITAL_COMMUNITY): Payer: Self-pay | Admitting: *Deleted

## 2017-03-07 ENCOUNTER — Encounter: Payer: Self-pay | Admitting: Family Medicine

## 2017-03-07 ENCOUNTER — Emergency Department (HOSPITAL_COMMUNITY)
Admission: EM | Admit: 2017-03-07 | Discharge: 2017-03-07 | Disposition: A | Discharge status: Home / Self Care | Payer: Medicare Other | Attending: Emergency Medicine | Admitting: Emergency Medicine

## 2017-03-07 VITALS — BP 155/82 | HR 92 | Temp 98.0°F | Ht 64.0 in | Wt 119.0 lb

## 2017-03-07 DIAGNOSIS — Z9114 Patient's other noncompliance with medication regimen: Secondary | ICD-10-CM

## 2017-03-07 DIAGNOSIS — S81802S Unspecified open wound, left lower leg, sequela: Secondary | ICD-10-CM

## 2017-03-07 DIAGNOSIS — E1165 Type 2 diabetes mellitus with hyperglycemia: Secondary | ICD-10-CM | POA: Diagnosis not present

## 2017-03-07 DIAGNOSIS — E11649 Type 2 diabetes mellitus with hypoglycemia without coma: Secondary | ICD-10-CM | POA: Insufficient documentation

## 2017-03-07 DIAGNOSIS — N189 Chronic kidney disease, unspecified: Secondary | ICD-10-CM | POA: Diagnosis not present

## 2017-03-07 DIAGNOSIS — I951 Orthostatic hypotension: Secondary | ICD-10-CM

## 2017-03-07 DIAGNOSIS — Z7982 Long term (current) use of aspirin: Secondary | ICD-10-CM | POA: Insufficient documentation

## 2017-03-07 DIAGNOSIS — I10 Essential (primary) hypertension: Secondary | ICD-10-CM | POA: Diagnosis not present

## 2017-03-07 DIAGNOSIS — T68XXXA Hypothermia, initial encounter: Secondary | ICD-10-CM

## 2017-03-07 DIAGNOSIS — I129 Hypertensive chronic kidney disease with stage 1 through stage 4 chronic kidney disease, or unspecified chronic kidney disease: Secondary | ICD-10-CM

## 2017-03-07 DIAGNOSIS — W938XXA Exposure to other excessive cold of man-made origin, initial encounter: Secondary | ICD-10-CM | POA: Diagnosis not present

## 2017-03-07 DIAGNOSIS — Z794 Long term (current) use of insulin: Secondary | ICD-10-CM | POA: Insufficient documentation

## 2017-03-07 DIAGNOSIS — N183 Chronic kidney disease, stage 3 (moderate): Secondary | ICD-10-CM | POA: Insufficient documentation

## 2017-03-07 DIAGNOSIS — E1122 Type 2 diabetes mellitus with diabetic chronic kidney disease: Secondary | ICD-10-CM | POA: Insufficient documentation

## 2017-03-07 DIAGNOSIS — E162 Hypoglycemia, unspecified: Secondary | ICD-10-CM

## 2017-03-07 DIAGNOSIS — Z87891 Personal history of nicotine dependence: Secondary | ICD-10-CM

## 2017-03-07 DIAGNOSIS — E161 Other hypoglycemia: Secondary | ICD-10-CM | POA: Diagnosis not present

## 2017-03-07 DIAGNOSIS — R55 Syncope and collapse: Secondary | ICD-10-CM | POA: Diagnosis present

## 2017-03-07 LAB — CBC WITH DIFFERENTIAL/PLATELET
BASOS ABS: 0 10*3/uL (ref 0.0–0.1)
Basophils Relative: 0 %
EOS PCT: 2 %
Eosinophils Absolute: 0.2 10*3/uL (ref 0.0–0.7)
HEMATOCRIT: 32.6 % — AB (ref 39.0–52.0)
Hemoglobin: 10.7 g/dL — ABNORMAL LOW (ref 13.0–17.0)
LYMPHS ABS: 1.8 10*3/uL (ref 0.7–4.0)
LYMPHS PCT: 22 %
MCH: 29.5 pg (ref 26.0–34.0)
MCHC: 32.8 g/dL (ref 30.0–36.0)
MCV: 89.8 fL (ref 78.0–100.0)
MONO ABS: 0.4 10*3/uL (ref 0.1–1.0)
MONOS PCT: 5 %
NEUTROS ABS: 5.9 10*3/uL (ref 1.7–7.7)
Neutrophils Relative %: 71 %
Platelets: 201 10*3/uL (ref 150–400)
RBC: 3.63 MIL/uL — ABNORMAL LOW (ref 4.22–5.81)
RDW: 13.3 % (ref 11.5–15.5)
WBC: 8.4 10*3/uL (ref 4.0–10.5)

## 2017-03-07 LAB — CBG MONITORING, ED
GLUCOSE-CAPILLARY: 82 mg/dL (ref 65–99)
Glucose-Capillary: 59 mg/dL — ABNORMAL LOW (ref 65–99)
Glucose-Capillary: 106 mg/dL — ABNORMAL HIGH (ref 65–99)
Glucose-Capillary: 89 mg/dL (ref 65–99)

## 2017-03-07 LAB — GLUCOSE, POCT (MANUAL RESULT ENTRY)
POC GLUCOSE: 347 mg/dL — AB (ref 70–99)
POC GLUCOSE: 126 mg/dL — AB (ref 70–99)

## 2017-03-07 LAB — I-STAT CHEM 8, ED
BUN: 12 mg/dL (ref 6–20)
CALCIUM ION: 0.91 mmol/L — AB (ref 1.15–1.40)
CHLORIDE: 105 mmol/L (ref 101–111)
CREATININE: 0.9 mg/dL (ref 0.61–1.24)
GLUCOSE: 192 mg/dL — AB (ref 65–99)
HCT: 26 % — ABNORMAL LOW (ref 39.0–52.0)
HEMOGLOBIN: 8.8 g/dL — AB (ref 13.0–17.0)
POTASSIUM: 3.4 mmol/L — AB (ref 3.5–5.1)
Sodium: 139 mmol/L (ref 135–145)
TCO2: 25 mmol/L (ref 0–100)

## 2017-03-07 LAB — POCT UA - MICROALBUMIN

## 2017-03-07 LAB — POCT GLYCOSYLATED HEMOGLOBIN (HGB A1C): HEMOGLOBIN A1C: 12.8

## 2017-03-07 MED ORDER — INSULIN ASPART 100 UNIT/ML ~~LOC~~ SOLN
20.0000 [IU] | Freq: Once | SUBCUTANEOUS | Status: AC
  Administered 2017-03-07: 20 [IU] via SUBCUTANEOUS

## 2017-03-07 MED ORDER — INSULIN ASPART PROT & ASPART (70-30 MIX) 100 UNIT/ML ~~LOC~~ SUSP: 12.0000 [IU] | Freq: Two times a day (BID) | SUBCUTANEOUS | 11 refills | Status: DC

## 2017-03-07 MED ORDER — ASPIRIN 81 MG PO TBEC: 81.0000 mg | DELAYED_RELEASE_TABLET | Freq: Every day | ORAL | 3 refills | Status: DC

## 2017-03-07 MED ORDER — DEXTROSE 50 % IV SOLN
1.0000 | Freq: Once | INTRAVENOUS | Status: DC
  Filled 2017-03-07: qty 50

## 2017-03-07 MED ORDER — DEXTROSE 50 % IV SOLN
1.0000 | Freq: Once | INTRAVENOUS | Status: AC
  Administered 2017-03-07: 50 mL via INTRAVENOUS

## 2017-03-07 MED ORDER — "INSULIN SYRINGE-NEEDLE U-100 25G X 1"" 1 ML MISC": 1.0000 | Freq: Two times a day (BID) | 3 refills | Status: DC

## 2017-03-07 MED ORDER — ACCU-CHEK AVIVA PLUS W/DEVICE KIT: 1.0000 | PACK | Freq: Three times a day (TID) | 0 refills | Status: DC

## 2017-03-07 MED ORDER — GLUCOSE BLOOD VI STRP: 1.0000 | ORAL_STRIP | Freq: Three times a day (TID) | 12 refills | Status: DC

## 2017-03-07 MED ORDER — ACCU-CHEK SOFTCLIX LANCETS MISC: 1.0000 | Freq: Three times a day (TID) | 12 refills | Status: DC

## 2017-03-07 MED ORDER — DEXTROSE-NACL 5-0.45 % IV SOLN
INTRAVENOUS | Status: DC
  Administered 2017-03-07: 15:00:00 via INTRAVENOUS

## 2017-03-07 NOTE — Discharge Instructions (Signed)
Do not take anymore. Diabetic medicine today. Start back on your diabetic medicine tomorrow. Go ahead and eat a meal tonight

## 2017-03-07 NOTE — ED Notes (Addendum)
Daugther, [place], stopped by. She said she could be called for information if needed. She reports the pt speaks [place]. Wants to be updated if the patient is admitted.

## 2017-03-07 NOTE — ED Triage Notes (Signed)
Per EMS, pt found unresponsive in house with CBG of 29. Pt family called EMS, family saw pt lay down beside his bed and become unresponsive. Family states the pt took his insulin but did not finish his meal due to not feeling well.   Pt given amp of D50, CBG rose to 190 initially, then dropped back down to 92.

## 2017-03-07 NOTE — ED Notes (Signed)
CBG 143  

## 2017-03-07 NOTE — Patient Instructions (Addendum)
Brian Owen was seen today for diabetes.  Diagnoses and all orders for this visit:  Type 2 diabetes mellitus with hyperglycemia, with long-term current use of insulin (HCC) -     POCT glucose (manual entry) -     POCT glycosylated hemoglobin (Hb A1C) -     insulin aspart (novoLOG) injection 20 Units; Inject 0.2 mLs (20 Units total) into the skin once. -     Discontinue: glucose blood (TRUE METRIX BLOOD GLUCOSE TEST) test strip; 1 each by Other route 3 (three) times daily. Use as instructed -     Discontinue: insulin aspart protamine- aspart (NOVOLOG MIX 70/30) (70-30) 100 UNIT/ML injection; Inject 0.12 mLs (12 Units total) into the skin 2 (two) times daily with a meal. -     POCT UA - Microalbumin -     insulin aspart protamine- aspart (NOVOLOG MIX 70/30) (70-30) 100 UNIT/ML injection; Inject 0.12 mLs (12 Units total) into the skin 2 (two) times daily with a meal. -     ACCU-CHEK SOFTCLIX LANCETS lancets; 1 each by Other route 3 (three) times daily. E11.65 -     Blood Glucose Monitoring Suppl (ACCU-CHEK AVIVA PLUS) w/Device KIT; 1 Device by Does not apply route 3 (three) times daily after meals. E11.65 -     glucose blood (ACCU-CHEK AVIVA PLUS) test strip; 1 each by Other route 3 (three) times daily. E11.65 -     aspirin 81 MG EC tablet; Take 1 tablet (81 mg total) by mouth daily. -     Insulin Syringe-Needle U-100 (B-D INSULIN SYRINGE 1CC/25GX1") 25G X 1" 1 ML MISC; 1 each by Other route 2 (two) times daily. Use as directed  Essential hypertension   Taper off midodrine from 3 pills a day to  1 pill twice a day for one week Then one pill daily in morning   Increase novolog to 12 U twice daily   Diabetes blood sugar goals  Fasting (in AM before breakfast, 8 hrs of no eating or drinking (except water or unsweetened coffee or tea): 90-130 2 hrs after meals: < 160,   No low sugars: nothing < 70   Return in 10 days for BP check with pharmacist, tapering off midodrine   f/u with me in 4 weeks  for diabetes   Dr. Adrian Blackwater

## 2017-03-07 NOTE — Progress Notes (Signed)
Subjective:  Patient ID: Brian Owen, male    DOB: 10/09/1951  Age: 66 y.o. MRN: [id number]  CC: Diabetes   HPI Brian Owen has uncontrolled diabetes, labile CGBs, CKD stage 3,  HTN, hx of C.Diff diarrhea,  medication non-compliance he presents for    1. Uncontrolled diabetes: he is compliant with current regimen. No monitoring CBGs due to not having test strips. No subjective low sugars.   2. Orthostoatic hypotension: on midodrine 5 mg 3 times a day. Denies dizziness or lightheadedness.   3. Chronic leg wound: improving. Patient being treated at wound care and at home.   Social History  Substance Use Topics  . Smoking status: Former Smoker    Packs/day: 1.50    Years: 48.00    Types: Cigarettes    Quit date: 2015  . Smokeless tobacco: Never Used  . Alcohol use No    Outpatient Medications Prior to Visit  Medication Sig Dispense Refill  . ACCU-CHEK SOFTCLIX LANCETS lancets 1 each by Other route 3 (three) times daily. E11.65 100 each 12  . aspirin EC 81 MG EC tablet Take 1 tablet (81 mg total) by mouth daily. 90 tablet 0  . Blood Glucose Monitoring Suppl (ACCU-CHEK AVIVA PLUS) w/Device KIT 1 Device by Does not apply route 3 (three) times daily after meals. E11.65 1 kit 0  . Blood Glucose Monitoring Suppl (TRUE METRIX METER) DEVI 1 kit by Does not apply route 4 (four) times daily. 1 Device 0  . glucose blood (ACCU-CHEK AVIVA PLUS) test strip 1 each by Other route 3 (three) times daily. E11.65 100 each 12  . glucose blood (TRUE METRIX BLOOD GLUCOSE TEST) test strip Use as instructed 100 each 12  . insulin aspart protamine- aspart (NOVOLOG MIX 70/30) (70-30) 100 UNIT/ML injection Inject 0.08 mLs (8 Units total) into the skin 2 (two) times daily with a meal. 10 mL 0  . Insulin Syringe-Needle U-100 (B-D INSULIN SYRINGE 1CC/25GX1") 25G X 1" 1 ML MISC Use as directed 100 each 0  . midodrine (PROAMATINE) 5 MG tablet Take 1 tablet (5 mg total) by mouth 3 (three) times daily with meals. 90  tablet 2  . TRUEPLUS LANCETS 28G MISC 28 g by Does not apply route 4 (four) times daily. 120 each 12  . camphor-menthol (SARNA) lotion Apply 1 application topically as needed for itching. (Patient not taking: Reported on 02/11/2017) 222 mL 5  . glimepiride (AMARYL) 2 MG tablet Take 1 tablet (2 mg total) by mouth daily before breakfast. (Patient not taking: Reported on 03/07/2017) 30 tablet 3  . levofloxacin (LEVAQUIN) 250 MG tablet Take 1 tablet (250 mg total) by mouth daily. (Patient not taking: Reported on 03/07/2017) 5 tablet 0   Facility-Administered Medications Prior to Visit  Medication Dose Route Frequency Provider Last Rate Last Dose  . insulin aspart (novoLOG) injection 20 Units  20 Units Subcutaneous Once Kaisa Wofford, MD        ROS Review of Systems  Constitutional: Positive for appetite change. Negative for chills, fatigue, fever and unexpected weight change.  Eyes: Negative for visual disturbance.  Respiratory: Negative for cough and shortness of breath.   Cardiovascular: Negative for chest pain, palpitations and leg swelling.  Gastrointestinal: Negative for abdominal pain, blood in stool, constipation, diarrhea, nausea and vomiting.  Endocrine: Negative for polydipsia, polyphagia and polyuria.  Musculoskeletal: Negative for arthralgias, back pain, gait problem, myalgias and neck pain.  Skin: Positive for wound. Negative for rash.  Allergic/Immunologic: Negative for immunocompromised state.  Neurological: Positive for headaches.  Hematological: Negative for adenopathy. Does not bruise/bleed easily.  Psychiatric/Behavioral: Negative for dysphoric mood, sleep disturbance and suicidal ideas. The patient is not nervous/anxious.     Objective:  BP (!) 155/82   Pulse 92   Temp 98 F (36.7 C) (Oral)   Ht '5\' 4"'  (1.626 m)   Wt 119 lb (54 kg)   SpO2 100%   BMI 20.43 kg/m   BP/Weight 03/07/2017 02/11/2017 3/64/6803  Systolic BP 212 248 250  Diastolic BP 82 80 78  Wt. (Lbs)  119 112.6 -  BMI 20.43 19.33 -    Physical Exam  Constitutional: He appears well-developed and well-nourished. No distress.  HENT:  Head: Normocephalic and atraumatic.  Neck: Normal range of motion. Neck supple.  Cardiovascular: Normal rate, regular rhythm, normal heart sounds and intact distal pulses.   Pulses:      Radial pulses are 2+ on the right side, and 2+ on the left side.       Posterior tibial pulses are 1+ on the right side, and 1+ on the left side.  Pulmonary/Chest: Effort normal and breath sounds normal.  Abdominal: Soft. Bowel sounds are normal. He exhibits no distension and no mass. There is no tenderness. There is no rebound and no guarding.  Musculoskeletal: He exhibits no edema.  Neurological: He is alert.  Skin: Skin is warm and dry. No rash noted. No erythema.     Psychiatric: He has a normal mood and affect.   Lab Results  Component Value Date   HGBA1C 15.0 (H) 01/13/2017   Lab Results  Component Value Date   HGBA1C 15.0 (H) 01/13/2017   CBG 347  Treated with 20 U of novolog Repeat CBG 126    Chemistry      Component Value Date/Time   NA 139 03/07/2017 1512   K 3.4 (L) 03/07/2017 1512   CL 105 03/07/2017 1512   CO2 26 02/10/2017 1723   BUN 12 03/07/2017 1512   BUN 17 08/10/2014   CREATININE 0.90 03/07/2017 1512   CREATININE 1.28 (H) 01/17/2016 1102      Component Value Date/Time   CALCIUM 8.8 (L) 02/10/2017 1723   ALKPHOS 166 (H) 02/10/2017 1723   AST 21 02/10/2017 1723   ALT 26 02/10/2017 1723   BILITOT 0.6 02/10/2017 1723       Assessment & Plan:  Brian Owen was seen today for diabetes.  Diagnoses and all orders for this visit:  Type 2 diabetes mellitus with hyperglycemia, with long-term current use of insulin (HCC) -     POCT glucose (manual entry) -     POCT glycosylated hemoglobin (Hb A1C) -     insulin aspart (novoLOG) injection 20 Units; Inject 0.2 mLs (20 Units total) into the skin once. -     Discontinue: glucose blood (TRUE  METRIX BLOOD GLUCOSE TEST) test strip; 1 each by Other route 3 (three) times daily. Use as instructed -     Discontinue: insulin aspart protamine- aspart (NOVOLOG MIX 70/30) (70-30) 100 UNIT/ML injection; Inject 0.12 mLs (12 Units total) into the skin 2 (two) times daily with a meal. -     POCT UA - Microalbumin -     Discontinue: insulin aspart protamine- aspart (NOVOLOG MIX 70/30) (70-30) 100 UNIT/ML injection; Inject 0.12 mLs (12 Units total) into the skin 2 (two) times daily with a meal. -     Discontinue: ACCU-CHEK SOFTCLIX LANCETS lancets; 1 each by Other route 3 (three) times daily. E11.65 -  Discontinue: Blood Glucose Monitoring Suppl (ACCU-CHEK AVIVA PLUS) w/Device KIT; 1 Device by Does not apply route 3 (three) times daily after meals. E11.65 -     Discontinue: glucose blood (ACCU-CHEK AVIVA PLUS) test strip; 1 each by Other route 3 (three) times daily. E11.65 -     Discontinue: aspirin 81 MG EC tablet; Take 1 tablet (81 mg total) by mouth daily. -     Discontinue: Insulin Syringe-Needle U-100 (B-D INSULIN SYRINGE 1CC/25GX1") 25G X 1" 1 ML MISC; 1 each by Other route 2 (two) times daily. Use as directed -     Discontinue: ACCU-CHEK SOFTCLIX LANCETS lancets; 1 each by Other route 3 (three) times daily. E11.65 -     Discontinue: aspirin 81 MG EC tablet; Take 1 tablet (81 mg total) by mouth daily. -     Discontinue: Blood Glucose Monitoring Suppl (ACCU-CHEK AVIVA PLUS) w/Device KIT; 1 Device by Does not apply route 3 (three) times daily after meals. E11.65 -     Discontinue: glucose blood (ACCU-CHEK AVIVA PLUS) test strip; 1 each by Other route 3 (three) times daily. E11.65 -     Discontinue: insulin aspart protamine- aspart (NOVOLOG MIX 70/30) (70-30) 100 UNIT/ML injection; Inject 0.12 mLs (12 Units total) into the skin 2 (two) times daily with a meal. -     Discontinue: Insulin Syringe-Needle U-100 (B-D INSULIN SYRINGE 1CC/25GX1") 25G X 1" 1 ML MISC; 1 each by Other route 2 (two) times  daily. Use as directed -     Discontinue: ACCU-CHEK SOFTCLIX LANCETS lancets; 1 each by Other route 3 (three) times daily. E11.65 (Patient not taking: Reported on 03/07/2017) -     Discontinue: Blood Glucose Monitoring Suppl (ACCU-CHEK AVIVA PLUS) w/Device KIT; 1 Device by Does not apply route 3 (three) times daily after meals. E11.65 (Patient not taking: Reported on 03/07/2017) -     Discontinue: aspirin 81 MG EC tablet; Take 1 tablet (81 mg total) by mouth daily. (Patient not taking: Reported on 03/07/2017) -     Discontinue: glucose blood (ACCU-CHEK AVIVA PLUS) test strip; 1 each by Other route 3 (three) times daily. E11.65 (Patient not taking: Reported on 03/07/2017) -     insulin aspart protamine- aspart (NOVOLOG MIX 70/30) (70-30) 100 UNIT/ML injection; Inject 0.12 mLs (12 Units total) into the skin 2 (two) times daily with a meal. -     Discontinue: Insulin Syringe-Needle U-100 (B-D INSULIN SYRINGE 1CC/25GX1") 25G X 1" 1 ML MISC; 1 each by Other route 2 (two) times daily. Use as directed (Patient not taking: Reported on 03/07/2017) -     POCT glucose (manual entry)  Essential hypertension  Orthostatic hypotension   There are no diagnoses linked to this encounter.  No orders of the defined types were placed in this encounter.   Follow-up: Return in about 10 days (around 03/17/2017) for BP check, tapering off midodrine .   Brian Nearing MD

## 2017-03-07 NOTE — ED Provider Notes (Signed)
[facility] DEPT Provider Note   CSN: [id number] Arrival date & time: 03/07/17  1429     History   Chief Complaint Chief Complaint  Patient presents with  . Loss of Consciousness  . Hypoglycemia    HPI Brian Owen is a 66 y.o. male.  HPI Patient presents with syncopal episode and mental status change. Found by family as he collapsed. Seen in clinic earlier today and found her sugar 350. Was given 20 units of subcutaneous NovoLog. Has not eaten. EMS gave D50 and sugar came up but has since come down. Had some. Patient does not speak much English and was translated by family member but also has some confusion.   Past Medical History:  Diagnosis Date  . [place] 11/11/2015   legs/notes 11/11/2015  . Chronic kidney disease   . Heavy cigarette smoker   . Parasite infection 2016   "couldn't afford to have surgery done; just left it; lower legs"  . Uncontrolled type II diabetes mellitus ([place]) dx'd 2013    Patient Active Problem List   Diagnosis Date Noted  . Leg wound, left 02/11/2017  . Syncope 01/13/2017  . Postural dizziness with presyncope   . Orthostatic hypotension 06/17/2016  . CKD (chronic kidney disease) 06/16/2016  . Underweight 01/17/2016  . Autonomic neuropathy due to diabetes ([place]) 11/29/2015  . Hyperglycemia 11/27/2015  . Vertigo 11/27/2015  . Orthostatic hypotension 11/27/2015  . SVT (supraventricular tachycardia) ([place]) 11/11/2015  . Protein-calorie malnutrition, severe ([place]) 07/26/2014  . History of tobacco use 06/27/2014  . Edentulism, complete 06/27/2014  . Language barrier to communication 06/27/2014  . HTN (hypertension) 06/27/2014  . Hyponatremia 05/13/2014  . Type 2 diabetes mellitus with hyperglycemia, with long-term current use of insulin ([place]) 05/13/2014    Past Surgical History:  Procedure Laterality Date  . NO PAST SURGERIES         Home Medications    Prior to Admission medications   Medication Sig Start Date End Date Taking?  Authorizing Provider  aspirin EC 81 MG tablet Take 81 mg by mouth daily.   Yes [provider]  insulin aspart protamine- aspart (NOVOLOG MIX 70/30) (70-30) 100 UNIT/ML injection Inject 0.12 mLs (12 Units total) into the skin 2 (two) times daily with a meal. 03/07/17  Yes Boykin Nearing, MD    Family History Family History  Problem Relation Age of Onset  . Diabetes Other   . Diabetes Unknown     Social History Social History  Substance Use Topics  . Smoking status: Former Smoker    Packs/day: 1.50    Years: 48.00    Types: Cigarettes    Quit date: 2015  . Smokeless tobacco: Never Used  . Alcohol use No     Allergies   Patient has no known allergies.   Review of Systems Review of Systems  Unable to perform ROS: Mental status change  Respiratory: Negative for shortness of breath.   Cardiovascular: Negative for chest pain.  Psychiatric/Behavioral: Positive for confusion.     Physical Exam Updated Vital Signs BP (!) 169/97   Pulse 91   Temp (!) 93.6 F (34.2 C) (Rectal)   Resp 14   Wt 119 lb (54 kg)   SpO2 99%   BMI 20.43 kg/m   Physical Exam  Constitutional: He appears well-developed.  HENT:  Head: Atraumatic.  Cardiovascular:  Mild tachycardia  Pulmonary/Chest: Effort normal.  Abdominal: Soft. There is no tenderness.  Musculoskeletal: He exhibits no edema.  Neurological: He is alert.  Somewhat somnolent but does arouse to stimulation. Will discuss more when his family member is present.  Skin: Skin is warm. Capillary refill takes less than 2 seconds.     ED Treatments / Results  Labs (all labs ordered are listed, but only abnormal results are displayed) Labs Reviewed  CBC WITH DIFFERENTIAL/PLATELET - Abnormal; Notable for the following:       Result Value   RBC 3.63 (*)    Hemoglobin 10.7 (*)    HCT 32.6 (*)    All other components within normal limits  CBG MONITORING, ED - Abnormal; Notable for the following:    Glucose-Capillary 59  (*)    All other components within normal limits  I-STAT CHEM 8, ED - Abnormal; Notable for the following:    Potassium 3.4 (*)    Glucose, Bld 192 (*)    Calcium, Ion 0.91 (*)    Hemoglobin 8.8 (*)    HCT 26.0 (*)    All other components within normal limits  CBG MONITORING, ED  CBG MONITORING, ED    EKG  EKG Interpretation None       Radiology No results found.  Procedures Procedures (including critical care time)  Medications Ordered in ED Medications  dextrose 50 % solution 50 mL (50 mLs Intravenous Not Given 03/07/17 1501)  dextrose 50 % solution 50 mL (50 mLs Intravenous Given 03/07/17 1500)     Initial Impression / Assessment and Plan / ED Course  I have reviewed the triage vital signs and the nursing notes.  Pertinent labs & imaging results that were available during my care of the patient were reviewed by me and considered in my medical decision making (see chart for details).     Patient with mental status change and hypoglycemia. Also hypothermic. Likely related to getting 20 units of NovoLog insulin subcutaneous earlier today and not eating. Mental status improves with glucose. Had been on D50 and then drip. Subcutaneous NovoLog has a half-life around 80 minutes. Will monitor for stable euglycemia. Potential discharge home. Care will be turned over to Dr Roderic Palau  CRITICAL CARE Performed by: Mackie Pai. Total critical care time:30 minutes Critical care time was exclusive of separately billable procedures and treating other patients. Critical care was necessary to treat or prevent imminent or life-threatening deterioration. Critical care was time spent personally by me on the following activities: development of treatment plan with patient and/or surrogate as well as nursing, discussions with consultants, evaluation of patient's response to treatment, examination of patient, obtaining history from patient or surrogate, ordering and performing treatments  and interventions, ordering and review of laboratory studies, ordering and review of radiographic studies, pulse oximetry and re-evaluation of patient's condition.   Final Clinical Impressions(s) / ED Diagnoses   Final diagnoses:  Hypoglycemia  Hypothermia, initial encounter    New Prescriptions New Prescriptions   No medications on file     Davonna Belling, MD 03/07/17 [phone]

## 2017-03-08 ENCOUNTER — Other Ambulatory Visit: Payer: Self-pay

## 2017-03-08 LAB — CBG MONITORING, ED: GLUCOSE-CAPILLARY: 143 mg/dL — AB (ref 65–99)

## 2017-03-08 NOTE — Patient Outreach (Signed)
Brian Owen [facility]) Care Management  03/08/17  Wolfe Cotugno [birth year] [id number]  Successful outreach completed with patient and his daughter Brian Owen. Patient identification verified.  Patient was seen in the ED on 03/07/2017 due to hypoglycemia. Per review of notes, patient received 20 units of insulin in MD office due to hyperglycemia and did not eat. His family later found him unconscious and he was taken to [facility] ED.  Patient's daughter stated that he does have a glucometer now, but he has not checked his blood sugar today. She stated that yesterday, he laid in the bed all day and didn't want to get up until his doctor's appointment so he did not eat or check his sugar.   He currently has no complaints. Per review, he has mentioned that he is afraid of taking insulin shots and had requested PO medications to treat his diabetes. His daughter stated that he is still supposed to take insulin but patient is afraid of giving them to himself.  Plan: Patient has been non-adherent with medication regimen. Will plan a home visit next week for some intensive 1:1 training on the importance of taking his medications, safety education and also importance of checking his blood sugars. Will attempt to identify barriers and establish a plan of care he is willing to work with to better manage his diabetes. Patient's daughter will be present and is able to translate but will use interpreter services to ensure accurate interpretation is being made so patient will hear the importance of taking his medications and monitoring his blood sugars, as well as taking his medications vs. The risks of not doing so.  Brian R. Equan Cogbill, RN, BSN, [place] Management Coordinator [phone]

## 2017-03-11 NOTE — Assessment & Plan Note (Signed)
Improving Continue wound care

## 2017-03-11 NOTE — Assessment & Plan Note (Signed)
A: elevated CBG in office. No home readings to review. Treated hyperglycemia in office P: Increase novolog 70/30 from 8 to 12 U BID Diabetes testing supplies ordered

## 2017-03-11 NOTE — Assessment & Plan Note (Signed)
A: elevated BP today on midodrine P: Taper off midodrine

## 2017-03-11 NOTE — Assessment & Plan Note (Signed)
Orthostatic with labile CBGs and low sodium in the past. Diabetes control is improving P: Taper off midodrine

## 2017-03-12 ENCOUNTER — Other Ambulatory Visit: Payer: Self-pay

## 2017-03-13 ENCOUNTER — Encounter: Payer: Self-pay | Admitting: Family Medicine

## 2017-03-13 DIAGNOSIS — F17218 Nicotine dependence, cigarettes, with other nicotine-induced disorders: Secondary | ICD-10-CM | POA: Diagnosis not present

## 2017-03-13 DIAGNOSIS — E11622 Type 2 diabetes mellitus with other skin ulcer: Secondary | ICD-10-CM | POA: Diagnosis not present

## 2017-03-13 DIAGNOSIS — E1143 Type 2 diabetes mellitus with diabetic autonomic (poly)neuropathy: Secondary | ICD-10-CM | POA: Diagnosis not present

## 2017-03-13 DIAGNOSIS — L97221 Non-pressure chronic ulcer of left calf limited to breakdown of skin: Secondary | ICD-10-CM | POA: Diagnosis not present

## 2017-03-13 DIAGNOSIS — E1122 Type 2 diabetes mellitus with diabetic chronic kidney disease: Secondary | ICD-10-CM | POA: Diagnosis not present

## 2017-03-13 DIAGNOSIS — L97222 Non-pressure chronic ulcer of left calf with fat layer exposed: Secondary | ICD-10-CM | POA: Diagnosis not present

## 2017-03-13 DIAGNOSIS — S81802A Unspecified open wound, left lower leg, initial encounter: Secondary | ICD-10-CM | POA: Diagnosis not present

## 2017-03-13 DIAGNOSIS — E1165 Type 2 diabetes mellitus with hyperglycemia: Secondary | ICD-10-CM | POA: Diagnosis not present

## 2017-03-13 DIAGNOSIS — F0390 Unspecified dementia without behavioral disturbance: Secondary | ICD-10-CM | POA: Diagnosis not present

## 2017-03-13 DIAGNOSIS — F17210 Nicotine dependence, cigarettes, uncomplicated: Secondary | ICD-10-CM | POA: Diagnosis not present

## 2017-03-13 DIAGNOSIS — I129 Hypertensive chronic kidney disease with stage 1 through stage 4 chronic kidney disease, or unspecified chronic kidney disease: Secondary | ICD-10-CM | POA: Diagnosis not present

## 2017-03-13 DIAGNOSIS — L97122 Non-pressure chronic ulcer of left thigh with fat layer exposed: Secondary | ICD-10-CM | POA: Diagnosis not present

## 2017-03-13 DIAGNOSIS — N183 Chronic kidney disease, stage 3 (moderate): Secondary | ICD-10-CM | POA: Diagnosis not present

## 2017-03-14 ENCOUNTER — Telehealth: Payer: Self-pay | Admitting: Family Medicine

## 2017-03-14 ENCOUNTER — Other Ambulatory Visit: Payer: Self-pay

## 2017-03-14 DIAGNOSIS — Z794 Long term (current) use of insulin: Principal | ICD-10-CM

## 2017-03-14 DIAGNOSIS — E1165 Type 2 diabetes mellitus with hyperglycemia: Secondary | ICD-10-CM

## 2017-03-14 MED ORDER — INSULIN ASPART PROT & ASPART (70-30 MIX) 100 UNIT/ML ~~LOC~~ SUSP: 8.0000 [IU] | Freq: Two times a day (BID) | SUBCUTANEOUS | 11 refills | Status: DC

## 2017-03-14 NOTE — Patient Outreach (Signed)
[facility]) Care Management  [facility] Care Manager  Late entry for 03/12/2017   Brian Owen 10/31/1950 [id number]  Subjective: Patient stated that he has been doing good. He currently denies any complaints or concerns.   Objective:   Encounter Medications:  Outpatient Encounter Prescriptions as of 03/12/2017  Medication Sig  . aspirin EC 81 MG tablet Take 81 mg by mouth daily.  . insulin aspart protamine- aspart (NOVOLOG MIX 70/30) (70-30) 100 UNIT/ML injection Inject 0.12 mLs (12 Units total) into the skin 2 (two) times daily with a meal.   No facility-administered encounter medications on file as of 03/12/2017.     Functional Status:  In your present state of health, do you have any difficulty performing the following activities: 01/23/2017 08/27/2016  Hearing? N N  Vision? Y Y  Difficulty concentrating or making decisions? N N  Walking or climbing stairs? Y Y  Dressing or bathing? N N  Doing errands, shopping? N Y  Conservation officer, nature and eating ? N -  Using the Toilet? N -  In the past six months, have you accidently leaked urine? N -  Do you have problems with loss of bowel control? N -  Managing your Medications? Y -  Managing your Finances? Y -  Housekeeping or managing your Housekeeping? N -  Some recent data might be hidden    Fall/Depression Screening: Fall Risk  03/07/2017 01/23/2017 07/25/2015  Falls in the past year? No No No   PHQ 2/9 Scores 03/07/2017 02/11/2017 01/23/2017 01/21/2017 12/05/2015 07/25/2015 06/23/2015  PHQ - 2 Score 1 1 0 2 2 0 3  PHQ- 9 Score 2 5 - 14 17 - -    Assessment:  Home visit completed with patient and his daughter,  Patient's daughter does speak [place], however RNCM used interpreter services for visit today to ensure patient understood what was being said. Call placed to [facility] line [phone]), spoke with Flossie Dibble Interpreter ID # [phone].  Patient now has an Insurance risk surveyor. Patient stated that his  granddaughter has been checking his blood sugar twice a day since getting the correct supplies. However, there are only 2 readings in the monitor. On 03/08/2017 at 1330, BS was 498 On 03/12/2017 at 1154, BS was 394. RNCM had patient take his glucometer out and demonstrate how he has been using it. Patient was unable to do so and did not know what any of the parts were for. Patient's daughter stated that he just tells his granddaughter what he wants and she listens to him. She does not encourage him to take his medicine or check his sugars. Patient verbalized that he is afraid of checking his blood sugar and taking insulin. Via interpreter, RNCM provided verbal education on the importance of checking his blood sugar every day. Encouraged to check it twice a day as his doctor has requested. He stated that he is supposed to check it in the morning between 8:30 am and 9:00 am and at 5:00 pm in the evening when he takes his insulin. RNCM educated patient on how to check his own blood sugar and had patient return demonstrate. This took over 30 minutes and patient still struggled with holding lancing device in correct position. He stated that he would not be checking it on his own because he was scare of sticking himself. His daughter stated that she would be checking it from now on. She stated she was familiar with doing this. Patient stated that he does not  give himself shots either due to fear of sticking himself. He and daughter were able to confirm that when he takes his insulin, they do rotate sites. RNCM requested patient bring his insulin into room for review and he brought an opened bottle of 70/30 insulin that was provided to him at [facility]. This bottle was a little less than  full. He stated that this was all he had, but his daughter stated that they had picked some up at the pharmacy as well. RNCM educated patient's daughter to write the date on the bottle when he opens it and that it is only good for  30 days once used for first time. She stated that he has not been taking his insulin as prescribed because he does not like shots. RNCM educated patient on the importance of taking his medication as prescribed. RNCM and interpreter spent significant amount of time educating patient on the risks associated with uncontrolled diabetes and not taking his medicine or monitoring his sugars. Patient would just nod in agreement, but it is unclear if he understood the importance. His daughter verbalized understanding and said that she would be taking over his care from now on and making sure he did what he was supposed to do. She wanted to talk about patient's diet, but after almost 2 hours, patient was still overwhelmed with how to check his blood sugar and with taking medicine. Advised it is important to start with one thing, such as taking his medicines and checking his blood sugars before adding a lot of other confusing information for him and she was agreeable. Advised will be able to share diet information at an another home visit, although did discuss the challenges culturally with his diet consisting of rice with every meal. Encouraged daughter to try to get whole grain rice and she stated that they have done this before and he will not eat it. Also, need to clarify how many units patient should be taking. He stated he is only taking  Patient needs an eye exam - daughter reported that he has never had one. Educated on importance and she said she would make an appointment.  Also, patient has only been taking 4 units of his insulin twice a day. He stated that was what he was told to do by his doctor. RNCM advised notes said he was supposed to take 12 units twice a day and he stated that he was told to take 4. His daughter is unclear on how many units. RNCM to clarify and have PCP office contact him with the correct dosage he should be taking.  Patient has been going to the wound care center for his leg wounds and  they are getting smaller and looking much better. He stated he is cleaning them and that the wound care center is applying dressings to them.   Plan:  Patient will check his blood sugar at least once a day for the next 30 days Patient will take insulin as prescribed at least 90 % of the time. RNCM to follow up with patient and his daughter within the next month.  [place] R. Jaken Fregia, RN, BSN, [place] Management Coordinator [phone]

## 2017-03-14 NOTE — Telephone Encounter (Signed)
Per Dr. Adrian Blackwater' last note, patient was to taper off of the midodrine and she increased the Novolog dose but I think this was due to her believing he was taking 8 units at that time. Will forward to Dr. Adrian Blackwater.

## 2017-03-14 NOTE — Telephone Encounter (Signed)
Pt calling to request a refill of his Novolog, also states that he is only taking 4 units of his insulin instead of taking the 12 units prescribed. Also taking midragine (not on med list) and wants to know if he should still be taking it. Requests Nicoletta Ba to call and go over dosage regimen with pt.

## 2017-03-14 NOTE — Patient Outreach (Signed)
[facility]) Care Management  03/14/17  Newell Bellot 1951-10-09 [id number]  Successful outreach completed with Amy at Dr. Adrian Blackwater office. Left message for Dr. Adrian Blackwater advising that patient has only been taking 4 units of insulin twice a day with meals, but should be taking 12 units of insulin twice daily. Also advised of patient need for a refill or prescription for his insulin. Also advised that patient has been taking midrodrine 5 mg daily and this is not on his medication list. Advised need to know if patient should still be on the medication. He is currently still taking until he hears otherwise from his provider. Amy to send message to Dr. Adrian Blackwater and Nicoletta Ba to review and will contact patient with instructions.  [place] R. Erlin Gardella, RN, BSN, [place] Management Coordinator [phone]

## 2017-03-14 NOTE — Telephone Encounter (Signed)
Yes patient was to taper off midodrine, please call back to patient to review the taper  Refilled novolog 70/30 dose reduced down to 8 U BID given patient is brittle and susceptible to hypoglycemia

## 2017-03-15 NOTE — Telephone Encounter (Signed)
Called patient and spoke with his daughter. Reviewed midodrine taper and insulin dose. She was able to repeat back instructions. She and her father have an appt with me 5/23 and I reminded her of that as well. Instructed her to call our office with any other questions.

## 2017-03-18 IMAGING — MR MR HEAD W/O CM
9 of 10 series · 34 of 48 positions shown · non-contrast
Comparison: CT of the head and cervical spine May 10, 2014

CLINICAL DATA: Dizziness beginning this evening. History of
diabetes, smoking.

EXAM:
MRI HEAD WITHOUT CONTRAST
TECHNIQUE: Multiplanar, multiecho pulse sequences of the brain and surrounding
structures were obtained without intravenous contrast.

[Series 3: T1 · sagittal · 5.0mm · 0.47mm/px · 3 of 24 slices shown]
[im 1/24]
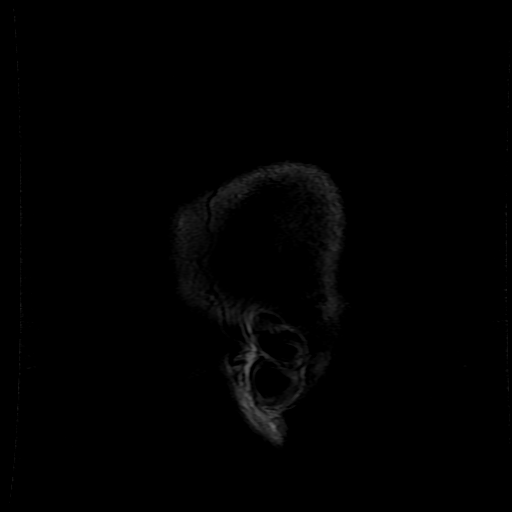
[im 12/24]
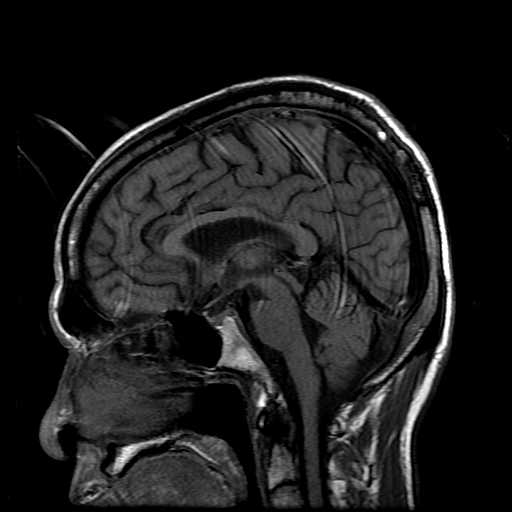
[im 24/24]
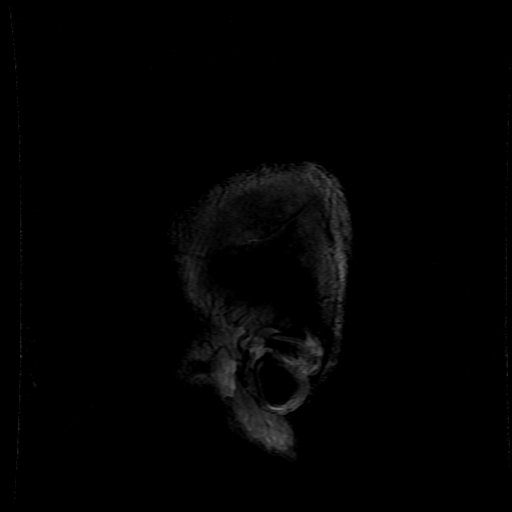

[Series 4: DWI · axial · 3.0mm · 1.09mm/px · z∈[-64,+83]mm · 8 of 100 slices shown (1 of 4)]
[im 1/100]
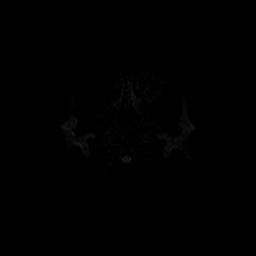
[im 12/100]
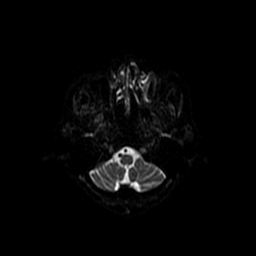
[im 34/100]
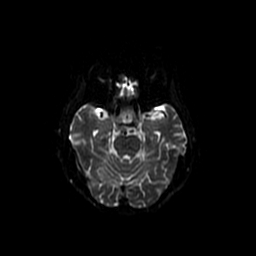
[im 45/100]
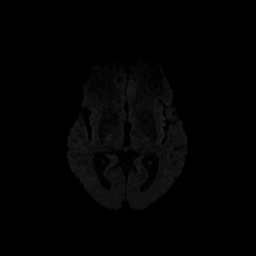
[im 56/100]
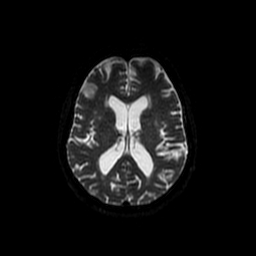
[im 67/100]
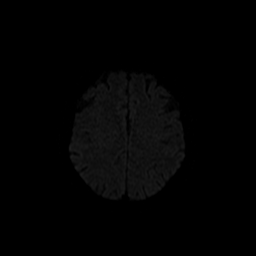
[im 89/100]
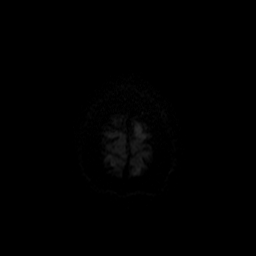
[im 100/100]
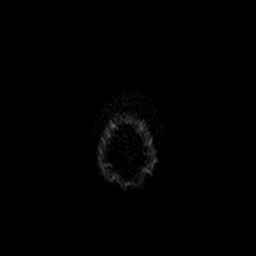

[Series 5: T2 · axial · 5.0mm · 0.43mm/px · z∈[-60,+84]mm · 3 of 25 slices shown (1 of 2)]
[im 1/25]
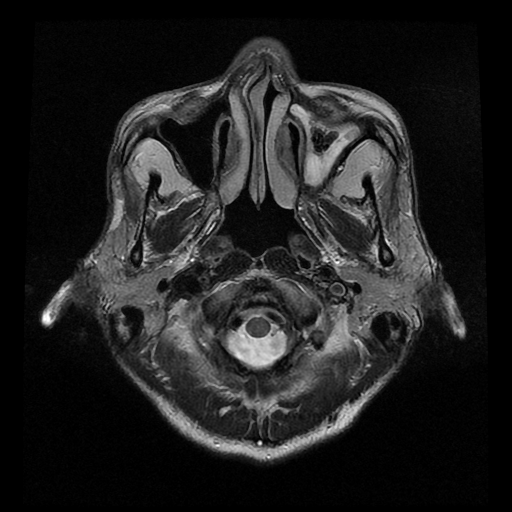
[im 13/25]
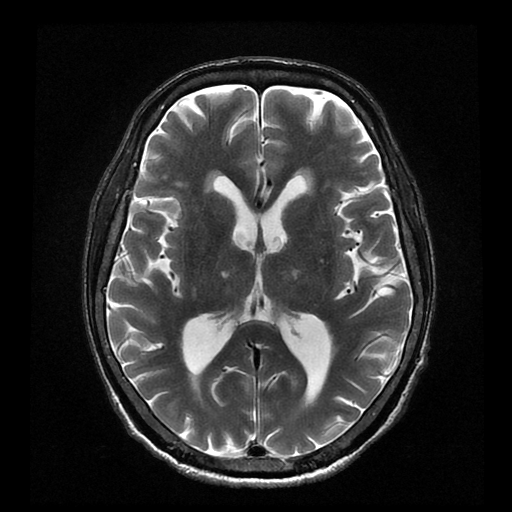
[im 25/25]
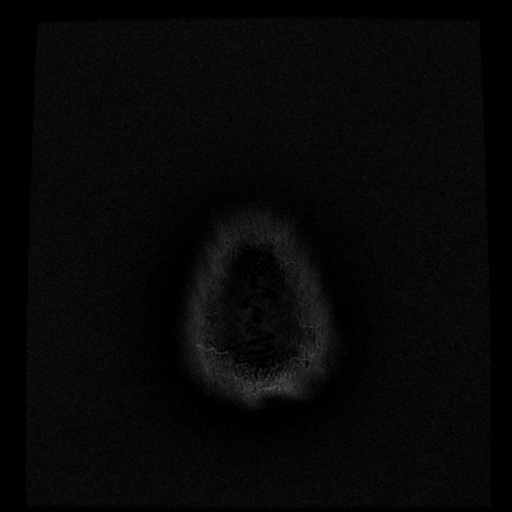

[Series 7: DWI · coronal · 5.0mm · 1.09mm/px · 6 of 62 slices shown (2 of 4)]
[im 1/62]
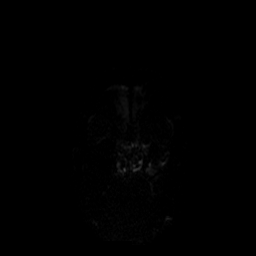
[im 13/62]
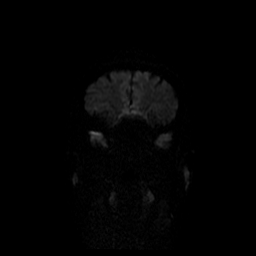
[im 25/62]
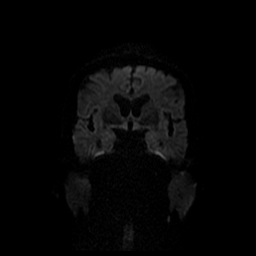
[im 37/62]
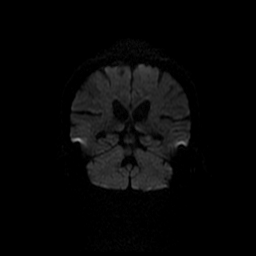
[im 49/62]
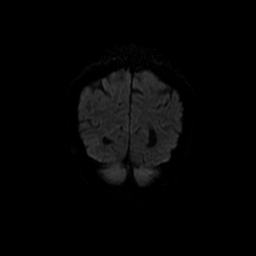
[im 62/62]
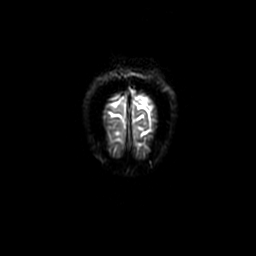

[Series 8: FLAIR · axial · 5.0mm · 0.43mm/px · z∈[-60,+84]mm · 3 of 25 slices shown]
[im 1/25]
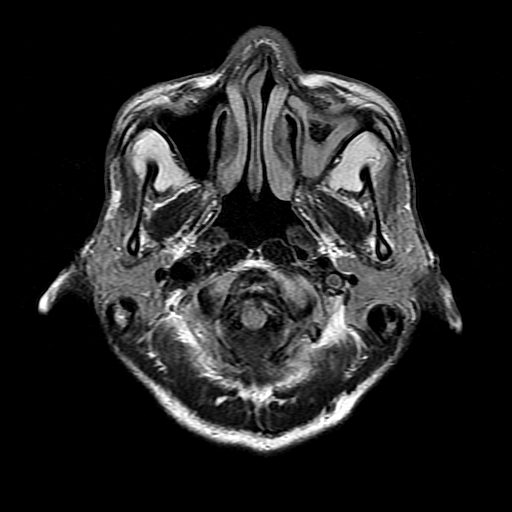
[im 13/25]
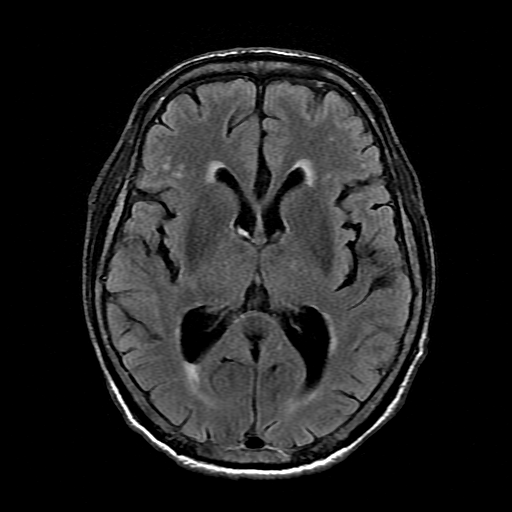
[im 25/25]
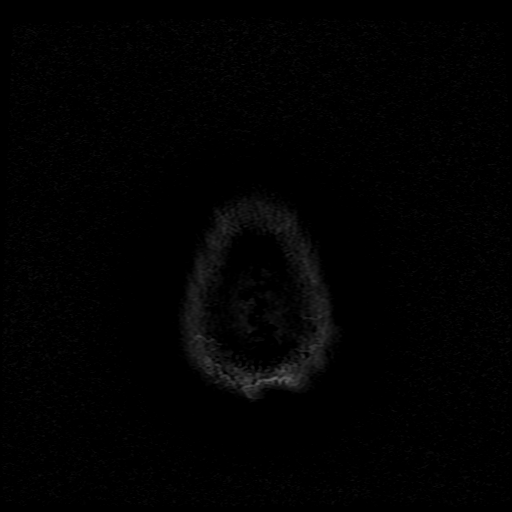

[Series 9: ax mpgr · axial · 5.0mm · 0.43mm/px · 1 of 25 slices shown]
[im 1/25]
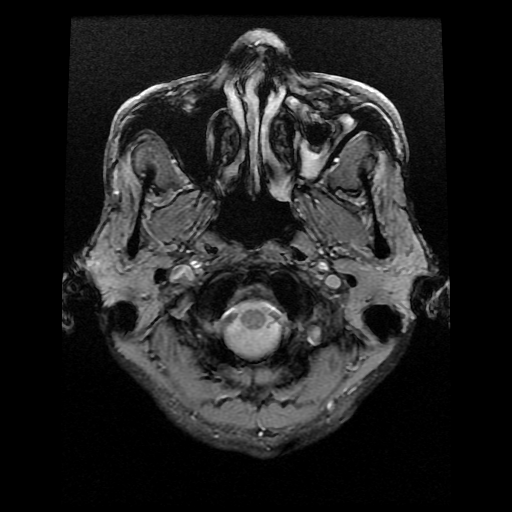

[Series 11: T2 · coronal · 5.0mm · 0.39mm/px · 2 of 24 slices shown (2 of 2)]
[im 1/24]
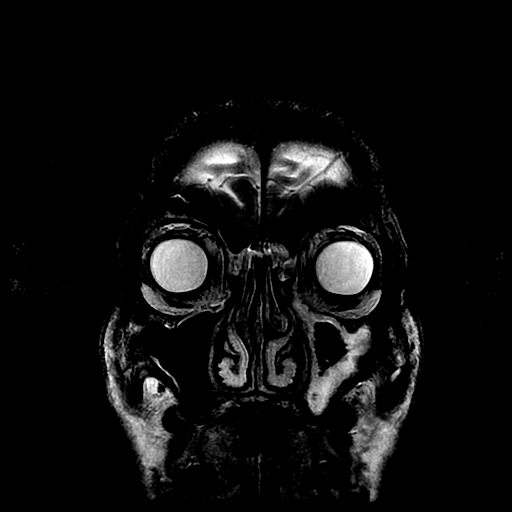
[im 24/24]
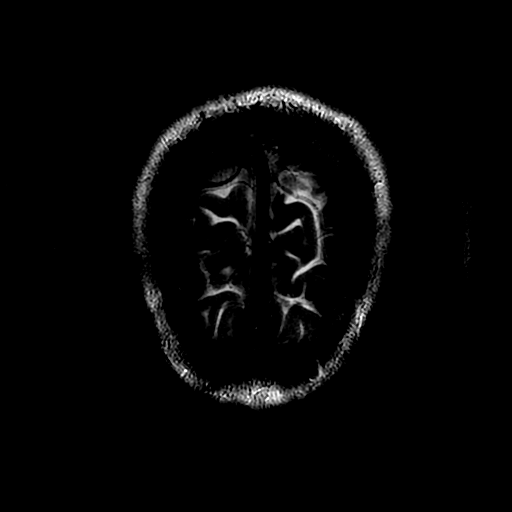

[Series 400: DWI · axial · 3.0mm · 1.09mm/px · z∈[-64,+83]mm · 5 of 50 slices shown (3 of 4)]
[im 1/50]
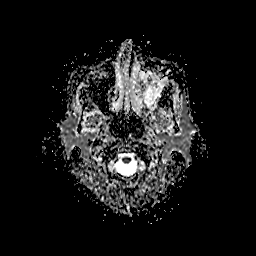
[im 13/50]
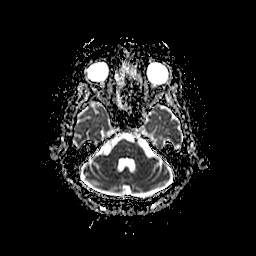
[im 25/50]
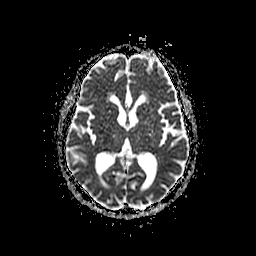
[im 37/50]
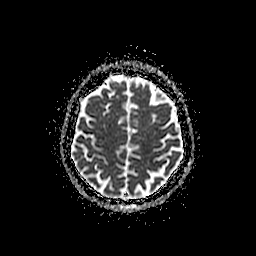
[im 50/50]
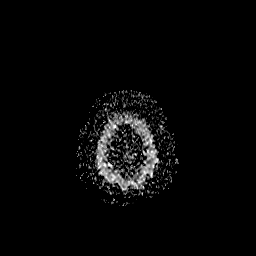

[Series 700: DWI · coronal · 5.0mm · 1.09mm/px · 3 of 31 slices shown (4 of 4)]
[im 1/31]
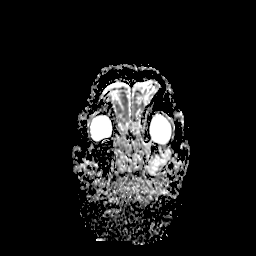
[im 16/31]
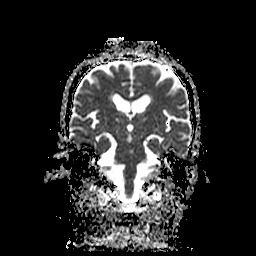
[im 31/31]
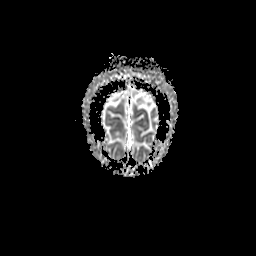

[34 of 48 positions shown; findings below may reference images not displayed]

FINDINGS: The ventricles and sulci are upper limits of normal for for
patient's age. No abnormal parenchymal signal, mass lesions, mass
effect. No reduced diffusion to suggest acute ischemia. No
susceptibility artifact to suggest hemorrhage. Patchy supratentorial
white matter FLAIR T2 hyperintensities. Tiny T2 hyperintensities
within the basal ganglia and thalamus likely represent perivascular
spaces.

No abnormal extra-axial fluid collections. No extra-axial masses
though, contrast enhanced sequences would be more sensitive. Normal
major intracranial vascular flow voids seen at the skull base.
Dolicoectatic appearance of the intracranial vessels compatible with
chronic hypertension.

Ocular globes and orbital contents are unremarkable though not
tailored for evaluation. No abnormal sellar expansion. Moderate
chronic LEFT maxillary sinusitis again noted. Moderate ethmoid
mucosal thickening. The mastoid air cells are well aerated. No
suspicious calvarial bone marrow signal. No abnormal sellar
expansion. Craniocervical junction maintained.
IMPRESSION: No acute intracranial process, specifically no acute ischemia.

Borderline parenchymal brain volume loss for age. Moderate white
matter changes suggest chronic small vessel ischemic disease.

Moderate paranasal sinusitis.

By: Cheok Hou Birkeland

## 2017-03-20 ENCOUNTER — Ambulatory Visit: Payer: Medicare Other | Attending: Family Medicine | Admitting: Pharmacist

## 2017-03-20 VITALS — BP 108/64 | HR 84

## 2017-03-20 DIAGNOSIS — E1122 Type 2 diabetes mellitus with diabetic chronic kidney disease: Secondary | ICD-10-CM | POA: Diagnosis not present

## 2017-03-20 DIAGNOSIS — S81802D Unspecified open wound, left lower leg, subsequent encounter: Secondary | ICD-10-CM | POA: Diagnosis not present

## 2017-03-20 DIAGNOSIS — E1165 Type 2 diabetes mellitus with hyperglycemia: Secondary | ICD-10-CM | POA: Diagnosis not present

## 2017-03-20 DIAGNOSIS — L97122 Non-pressure chronic ulcer of left thigh with fat layer exposed: Secondary | ICD-10-CM | POA: Diagnosis not present

## 2017-03-20 DIAGNOSIS — Z794 Long term (current) use of insulin: Secondary | ICD-10-CM

## 2017-03-20 DIAGNOSIS — Z79899 Other long term (current) drug therapy: Secondary | ICD-10-CM | POA: Insufficient documentation

## 2017-03-20 DIAGNOSIS — E119 Type 2 diabetes mellitus without complications: Secondary | ICD-10-CM | POA: Diagnosis not present

## 2017-03-20 DIAGNOSIS — F0390 Unspecified dementia without behavioral disturbance: Secondary | ICD-10-CM | POA: Diagnosis not present

## 2017-03-20 DIAGNOSIS — F17218 Nicotine dependence, cigarettes, with other nicotine-induced disorders: Secondary | ICD-10-CM | POA: Diagnosis not present

## 2017-03-20 DIAGNOSIS — E11622 Type 2 diabetes mellitus with other skin ulcer: Secondary | ICD-10-CM | POA: Diagnosis not present

## 2017-03-20 DIAGNOSIS — L97222 Non-pressure chronic ulcer of left calf with fat layer exposed: Secondary | ICD-10-CM | POA: Diagnosis not present

## 2017-03-20 MED ORDER — INSULIN ASPART PROT & ASPART (70-30 MIX) 100 UNIT/ML ~~LOC~~ SUSP: 12.0000 [IU] | Freq: Two times a day (BID) | SUBCUTANEOUS | 11 refills | Status: DC

## 2017-03-20 NOTE — Patient Instructions (Addendum)
Thanks for coming to see Brian Owen!  Take 1 midodrine tablet daily for 1 week and then stop  Increase Novolog 70/30 to 12 units twice daily.  Follow up with Dr. Adrian Blackwater in two weeks   Hypoglycemia  Hypoglycemia is when the sugar (glucose) level in the blood is too low. Symptoms of low blood sugar may include:  Feeling:  Hungry.  Worried or nervous (anxious).  Sweaty and clammy.  Confused.  Dizzy.  Sleepy.  Sick to your stomach (nauseous).  Having:  A fast heartbeat.  A headache.  A change in your vision.  Jerky movements that you cannot control (seizure).  Nightmares.  Tingling or no feeling (numbness) around the mouth, lips, or tongue.  Having trouble with:  Talking.  Paying attention (concentrating).  Moving (coordination).  Sleeping.  Shaking.  Passing out (fainting).  Getting upset easily (irritability). Low blood sugar can happen to people who have diabetes and people who do not have diabetes. Low blood sugar can happen quickly, and it can be an emergency. Treating Low Blood Sugar  Low blood sugar is often treated by eating or drinking something sugary right away. If you can think clearly and swallow safely, follow the 15:15 rule:  Take 15 grams of a fast-acting carb (carbohydrate). Some fast-acting carbs are:  1 tube of glucose gel.  3 sugar tablets (glucose pills).  6-8 pieces of hard candy.  4 oz (120 mL) of fruit juice.  4 oz (120 mL) of regular (not diet) soda.  Check your blood sugar 15 minutes after you take the carb.  If your blood sugar is still at or below 70 mg/dL (3.9 mmol/L), take 15 grams of a carb again.  If your blood sugar does not go above 70 mg/dL (3.9 mmol/L) after 3 tries, get help right away.  After your blood sugar goes back to normal, eat a meal or a snack within 1 hour. Treating Very Low Blood Sugar  If your blood sugar is at or below 54 mg/dL (3 mmol/L), you have very low blood sugar (severe hypoglycemia). This  is an emergency. Do not wait to see if the symptoms will go away. Get medical help right away. Call your local emergency services (911 in the [place] Do not drive yourself to the hospital. If you have very low blood sugar and you cannot eat or drink, you may need a glucagon shot (injection). A family member or friend should learn how to check your blood sugar and how to give you a glucagon shot. Ask your doctor if you need to have a glucagon shot kit at home. Follow these instructions at home: General instructions   Avoid any diets that cause you to not eat enough food. Talk with your doctor before you start any new diet.  Take over-the-counter and prescription medicines only as told by your doctor.  Limit alcohol to no more than 1 drink per day for nonpregnant women and 2 drinks per day for men. One drink equals 12 oz of beer, 5 oz of wine, or 1 oz of hard liquor.  Keep all follow-up visits as told by your doctor. This is important. If You Have Diabetes:    Make sure you know the symptoms of low blood sugar.  Always keep a source of sugar with you, such as:  Sugar.  Sugar tablets.  Glucose gel.  Fruit juice.  Regular soda (not diet soda).  Milk.  Hard candy.  Honey.  Take your medicines as told.  Follow your  exercise and meal plan.  Eat on time. Do not skip meals.  Follow your sick day plan when you cannot eat or drink normally. Make this plan ahead of time with your doctor.  Check your blood sugar as often as told by your doctor. Always check before and after exercise.  Share your diabetes care plan with:  Your work or school.  People you live with.  Check your pee (urine) for ketones:  When you are sick.  As told by your doctor.  Carry a card or wear jewelry that says you have diabetes. If You Have Low Blood Sugar From Other Causes:    Check your blood sugar as often as told by your doctor.  Follow instructions from your doctor about what you cannot  eat or drink. Contact a doctor if:  You have trouble keeping your blood sugar in your target range.  You have low blood sugar often. Get help right away if:  You still have symptoms after you eat or drink something sugary.  Your blood sugar is at or below 54 mg/dL (3 mmol/L).  You have jerky movements that you cannot control.  You pass out. These symptoms may be an emergency. Do not wait to see if the symptoms will go away. Get medical help right away. Call your local emergency services (911 in the [place] Do not drive yourself to the hospital. This information is not intended to replace advice given to you by your health care provider. Make sure you discuss any questions you have with your health care provider. Document Released: 01/09/2010 Document Revised: 03/22/2016 Document Reviewed: 11/18/2015 Elsevier Interactive Patient Education  2017 [employer].

## 2017-03-20 NOTE — Progress Notes (Addendum)
    S:     Chief Complaint  Patient presents with  . Medication Management    Patient arrives in good spirits with his wife and an interpreter.  Presents for diabetes evaluation, education, and management at the request of Dr. Adrian Blackwater. Patient was referred on 03/07/17.  Patient was last seen by Primary Care Provider on 03/07/17.   Patient reports adherence with medications. He has not completed the midodrine taper. Current diabetes medications include: Novolog 70/30 8 units BID.  Patient denies hypoglycemic events.  Patient reported dietary habits: patient drinks water throughout the day   Patient reports nocturia.  Patient denies neuropathy. Patient denies visual changes. Patient reports self foot exams.   Overall, he feels better as his blood sugars are slowly coming down.   O:  Physical Exam   ROS   Lab Results  Component Value Date   HGBA1C 12.8 03/07/2017   Vitals:   03/20/17 0847  BP: 108/64  Pulse: 84    Home fasting CBG: 114-212 (most upper 100s-200s) 2 hour post-prandial/random CBG: 144-403 (most >200)  A/P: Diabetes longstanding currently UNcontrolled based on A1c of 12.8. Patient denies hypoglycemic events and is able to verbalize appropriate hypoglycemia management plan. Patient reports adherence with medication. Control is suboptimal due to poor health literacy.  Increase Novolog 70/30 to 12 units twice daily. Patient instructed to call us or come see [place] if he has a CBG <70.   Of note, blood pressure WNL. Patient still taking 1 tablet of midodrine twice daily. Instructed to start taking it once daily per the tapering instructions provided by Dr. Adrian Blackwater.  Next A1C anticipated August 2018.     Written patient instructions provided.  Total time in face to face counseling 20 minutes.   Follow up in Pharmacist Clinic Visit PRN, next visit with Dr. Adrian Blackwater.   Patient seen with Sallyanne Havers, PharmD Candidate

## 2017-03-21 ENCOUNTER — Telehealth: Payer: Self-pay | Admitting: Family Medicine

## 2017-03-21 DIAGNOSIS — I129 Hypertensive chronic kidney disease with stage 1 through stage 4 chronic kidney disease, or unspecified chronic kidney disease: Secondary | ICD-10-CM | POA: Diagnosis not present

## 2017-03-21 DIAGNOSIS — E1122 Type 2 diabetes mellitus with diabetic chronic kidney disease: Secondary | ICD-10-CM | POA: Diagnosis not present

## 2017-03-21 DIAGNOSIS — E1143 Type 2 diabetes mellitus with diabetic autonomic (poly)neuropathy: Secondary | ICD-10-CM | POA: Diagnosis not present

## 2017-03-21 DIAGNOSIS — E1165 Type 2 diabetes mellitus with hyperglycemia: Secondary | ICD-10-CM | POA: Diagnosis not present

## 2017-03-21 DIAGNOSIS — N183 Chronic kidney disease, stage 3 (moderate): Secondary | ICD-10-CM | POA: Diagnosis not present

## 2017-03-21 DIAGNOSIS — L97221 Non-pressure chronic ulcer of left calf limited to breakdown of skin: Secondary | ICD-10-CM | POA: Diagnosis not present

## 2017-03-21 NOTE — Telephone Encounter (Signed)
Lauren from [facility] called requesting VO for 1 time a week for 8 weeks. She would like 2 PRN visits, wound care, DM monitoring and social worker evaluation. Please f/u

## 2017-03-24 DIAGNOSIS — E1143 Type 2 diabetes mellitus with diabetic autonomic (poly)neuropathy: Secondary | ICD-10-CM | POA: Diagnosis not present

## 2017-03-24 DIAGNOSIS — I129 Hypertensive chronic kidney disease with stage 1 through stage 4 chronic kidney disease, or unspecified chronic kidney disease: Secondary | ICD-10-CM | POA: Diagnosis not present

## 2017-03-24 DIAGNOSIS — Z720 Tobacco use: Secondary | ICD-10-CM | POA: Diagnosis not present

## 2017-03-24 DIAGNOSIS — N183 Chronic kidney disease, stage 3 (moderate): Secondary | ICD-10-CM | POA: Diagnosis not present

## 2017-03-24 DIAGNOSIS — Z7984 Long term (current) use of oral hypoglycemic drugs: Secondary | ICD-10-CM | POA: Diagnosis not present

## 2017-03-24 DIAGNOSIS — E1122 Type 2 diabetes mellitus with diabetic chronic kidney disease: Secondary | ICD-10-CM | POA: Diagnosis not present

## 2017-03-24 DIAGNOSIS — Z9181 History of falling: Secondary | ICD-10-CM | POA: Diagnosis not present

## 2017-03-24 DIAGNOSIS — L97221 Non-pressure chronic ulcer of left calf limited to breakdown of skin: Secondary | ICD-10-CM | POA: Diagnosis not present

## 2017-03-24 DIAGNOSIS — Z87440 Personal history of urinary (tract) infections: Secondary | ICD-10-CM | POA: Diagnosis not present

## 2017-03-24 DIAGNOSIS — Z794 Long term (current) use of insulin: Secondary | ICD-10-CM | POA: Diagnosis not present

## 2017-03-24 DIAGNOSIS — E43 Unspecified severe protein-calorie malnutrition: Secondary | ICD-10-CM | POA: Diagnosis not present

## 2017-03-24 DIAGNOSIS — I951 Orthostatic hypotension: Secondary | ICD-10-CM | POA: Diagnosis not present

## 2017-03-27 NOTE — Telephone Encounter (Signed)
Verbal orders were given 

## 2017-03-28 DIAGNOSIS — I129 Hypertensive chronic kidney disease with stage 1 through stage 4 chronic kidney disease, or unspecified chronic kidney disease: Secondary | ICD-10-CM | POA: Diagnosis not present

## 2017-03-28 DIAGNOSIS — N183 Chronic kidney disease, stage 3 (moderate): Secondary | ICD-10-CM | POA: Diagnosis not present

## 2017-03-28 DIAGNOSIS — E1122 Type 2 diabetes mellitus with diabetic chronic kidney disease: Secondary | ICD-10-CM | POA: Diagnosis not present

## 2017-03-28 DIAGNOSIS — E1143 Type 2 diabetes mellitus with diabetic autonomic (poly)neuropathy: Secondary | ICD-10-CM | POA: Diagnosis not present

## 2017-03-28 DIAGNOSIS — L97221 Non-pressure chronic ulcer of left calf limited to breakdown of skin: Secondary | ICD-10-CM | POA: Diagnosis not present

## 2017-03-28 DIAGNOSIS — E43 Unspecified severe protein-calorie malnutrition: Secondary | ICD-10-CM | POA: Diagnosis not present

## 2017-04-03 ENCOUNTER — Ambulatory Visit: Payer: Self-pay

## 2017-04-04 ENCOUNTER — Ambulatory Visit: Payer: Medicare Other | Admitting: Pharmacist

## 2017-04-04 ENCOUNTER — Ambulatory Visit: Payer: Medicare Other | Admitting: Family Medicine

## 2017-04-04 DIAGNOSIS — N183 Chronic kidney disease, stage 3 (moderate): Secondary | ICD-10-CM | POA: Diagnosis not present

## 2017-04-04 DIAGNOSIS — L97221 Non-pressure chronic ulcer of left calf limited to breakdown of skin: Secondary | ICD-10-CM | POA: Diagnosis not present

## 2017-04-04 DIAGNOSIS — I129 Hypertensive chronic kidney disease with stage 1 through stage 4 chronic kidney disease, or unspecified chronic kidney disease: Secondary | ICD-10-CM | POA: Diagnosis not present

## 2017-04-04 DIAGNOSIS — E1143 Type 2 diabetes mellitus with diabetic autonomic (poly)neuropathy: Secondary | ICD-10-CM | POA: Diagnosis not present

## 2017-04-04 DIAGNOSIS — E43 Unspecified severe protein-calorie malnutrition: Secondary | ICD-10-CM | POA: Diagnosis not present

## 2017-04-04 DIAGNOSIS — E1122 Type 2 diabetes mellitus with diabetic chronic kidney disease: Secondary | ICD-10-CM | POA: Diagnosis not present

## 2017-04-09 DIAGNOSIS — E1122 Type 2 diabetes mellitus with diabetic chronic kidney disease: Secondary | ICD-10-CM | POA: Diagnosis not present

## 2017-04-09 DIAGNOSIS — I129 Hypertensive chronic kidney disease with stage 1 through stage 4 chronic kidney disease, or unspecified chronic kidney disease: Secondary | ICD-10-CM | POA: Diagnosis not present

## 2017-04-09 DIAGNOSIS — L97221 Non-pressure chronic ulcer of left calf limited to breakdown of skin: Secondary | ICD-10-CM | POA: Diagnosis not present

## 2017-04-09 DIAGNOSIS — N183 Chronic kidney disease, stage 3 (moderate): Secondary | ICD-10-CM | POA: Diagnosis not present

## 2017-04-09 DIAGNOSIS — E1143 Type 2 diabetes mellitus with diabetic autonomic (poly)neuropathy: Secondary | ICD-10-CM | POA: Diagnosis not present

## 2017-04-09 DIAGNOSIS — E43 Unspecified severe protein-calorie malnutrition: Secondary | ICD-10-CM | POA: Diagnosis not present

## 2017-04-15 DIAGNOSIS — E43 Unspecified severe protein-calorie malnutrition: Secondary | ICD-10-CM | POA: Diagnosis not present

## 2017-04-15 DIAGNOSIS — E1122 Type 2 diabetes mellitus with diabetic chronic kidney disease: Secondary | ICD-10-CM | POA: Diagnosis not present

## 2017-04-15 DIAGNOSIS — E1143 Type 2 diabetes mellitus with diabetic autonomic (poly)neuropathy: Secondary | ICD-10-CM | POA: Diagnosis not present

## 2017-04-15 DIAGNOSIS — N183 Chronic kidney disease, stage 3 (moderate): Secondary | ICD-10-CM | POA: Diagnosis not present

## 2017-04-15 DIAGNOSIS — I129 Hypertensive chronic kidney disease with stage 1 through stage 4 chronic kidney disease, or unspecified chronic kidney disease: Secondary | ICD-10-CM | POA: Diagnosis not present

## 2017-04-15 DIAGNOSIS — L97221 Non-pressure chronic ulcer of left calf limited to breakdown of skin: Secondary | ICD-10-CM | POA: Diagnosis not present

## 2017-04-16 ENCOUNTER — Ambulatory Visit: Payer: Medicare Other | Admitting: Pharmacist

## 2017-04-20 IMAGING — CR DG CHEST 2V
2 series · 2 of 2 positions shown · non-contrast
Comparison: Chest radiograph 09/10/2014

CLINICAL DATA: Patient with dizziness, headache and fatigue for 1
day.

EXAM:
CHEST  2 VIEW

[chest lat]
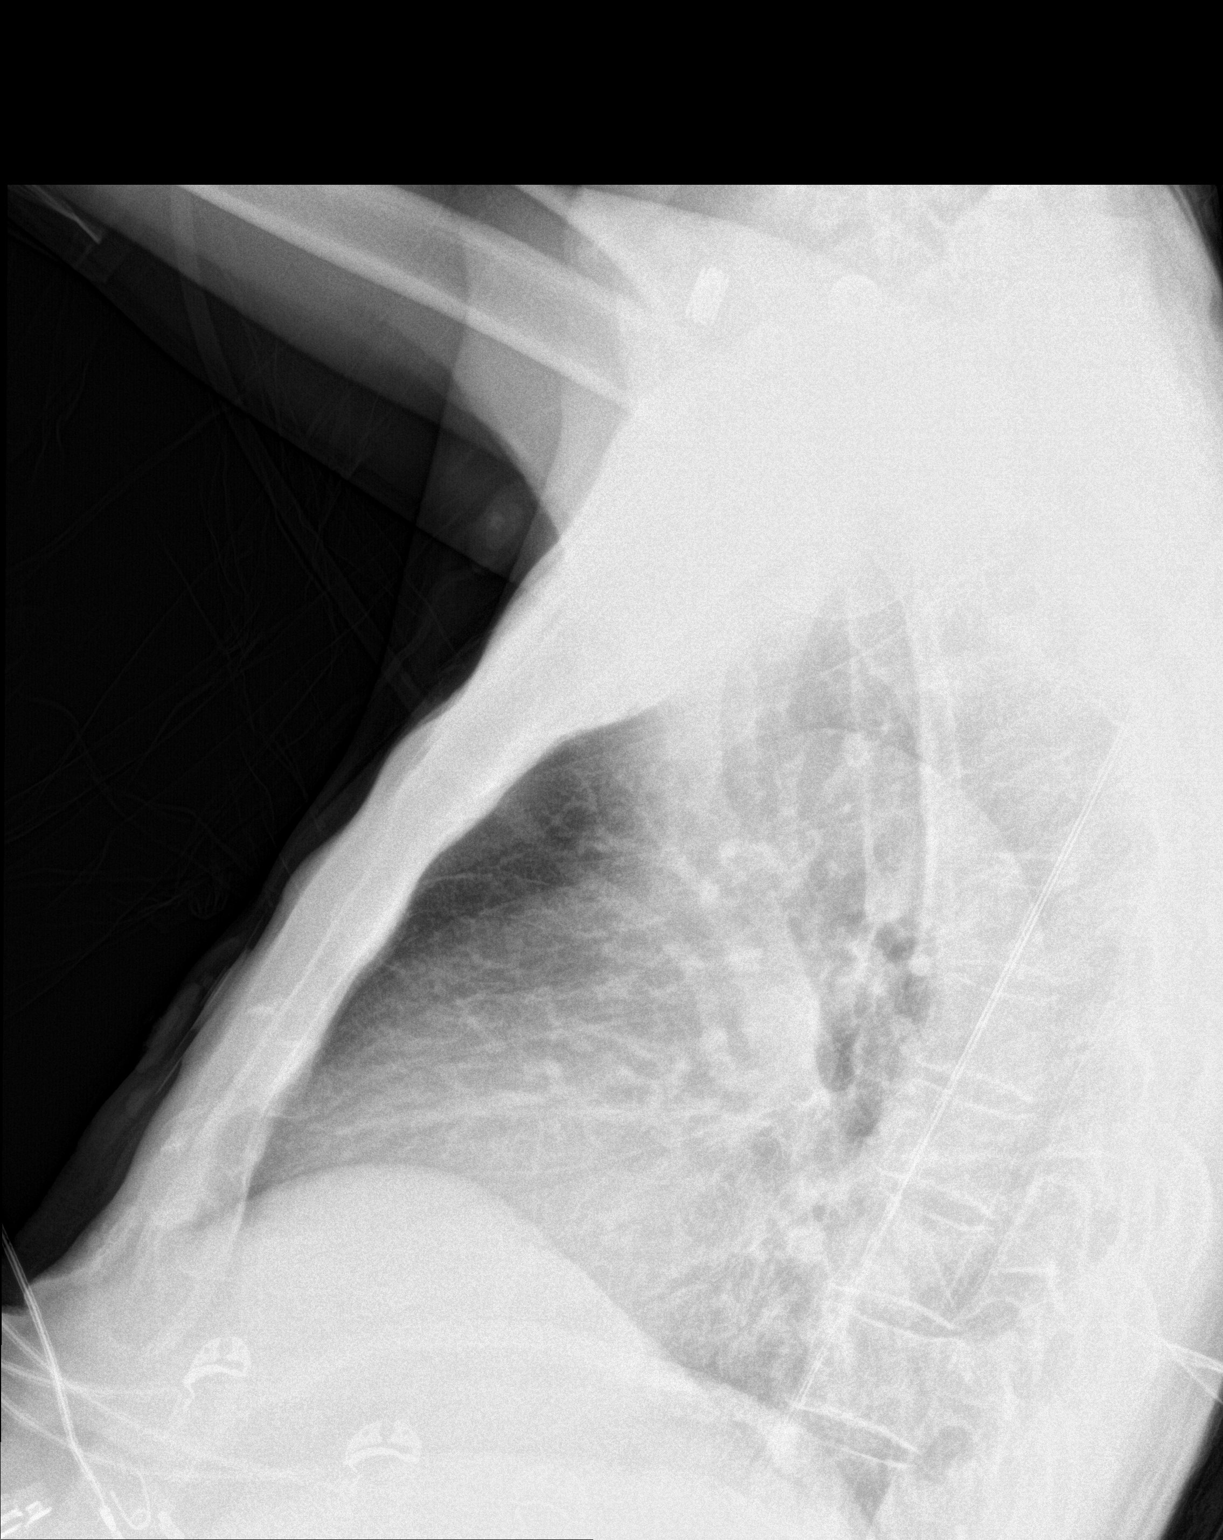

[chest ap]
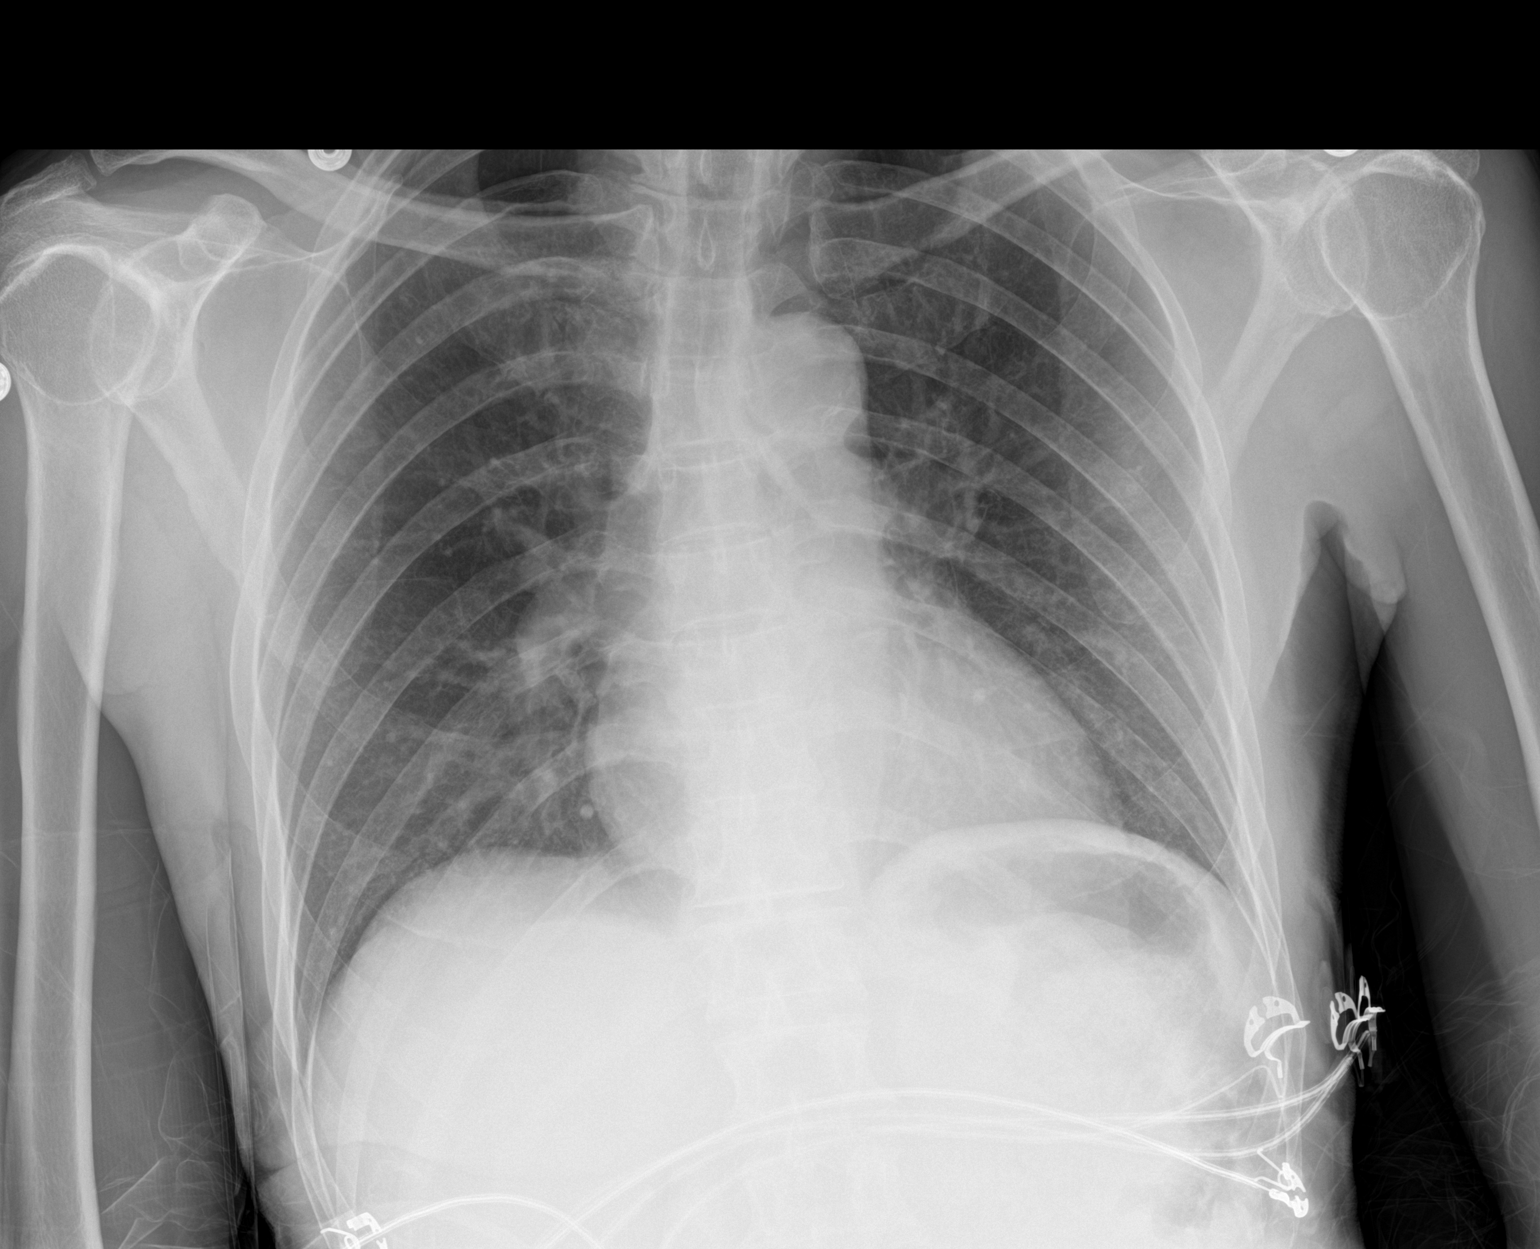

[2 of 2 positions shown; findings below may reference images not displayed]

FINDINGS: Stable cardiac and mediastinal contours. No consolidative pulmonary
opacities. No pleural effusion or pneumothorax. Lateral view limited
secondary to patient positioning and overlying soft tissue.
IMPRESSION: No acute cardiopulmonary process.

## 2017-04-24 DIAGNOSIS — E1122 Type 2 diabetes mellitus with diabetic chronic kidney disease: Secondary | ICD-10-CM | POA: Diagnosis not present

## 2017-04-24 DIAGNOSIS — E43 Unspecified severe protein-calorie malnutrition: Secondary | ICD-10-CM | POA: Diagnosis not present

## 2017-04-24 DIAGNOSIS — N183 Chronic kidney disease, stage 3 (moderate): Secondary | ICD-10-CM | POA: Diagnosis not present

## 2017-04-24 DIAGNOSIS — L97221 Non-pressure chronic ulcer of left calf limited to breakdown of skin: Secondary | ICD-10-CM | POA: Diagnosis not present

## 2017-04-24 DIAGNOSIS — E1143 Type 2 diabetes mellitus with diabetic autonomic (poly)neuropathy: Secondary | ICD-10-CM | POA: Diagnosis not present

## 2017-04-24 DIAGNOSIS — I129 Hypertensive chronic kidney disease with stage 1 through stage 4 chronic kidney disease, or unspecified chronic kidney disease: Secondary | ICD-10-CM | POA: Diagnosis not present

## 2017-04-26 ENCOUNTER — Ambulatory Visit: Payer: Medicare Other | Admitting: Family Medicine

## 2017-04-28 ENCOUNTER — Encounter (HOSPITAL_COMMUNITY): Payer: Self-pay

## 2017-04-28 ENCOUNTER — Emergency Department (HOSPITAL_COMMUNITY)
Admission: EM | Admit: 2017-04-28 | Discharge: 2017-04-29 | Disposition: A | Discharge status: Home / Self Care | Payer: Medicare Other | Attending: Emergency Medicine | Admitting: Emergency Medicine

## 2017-04-28 DIAGNOSIS — N189 Chronic kidney disease, unspecified: Secondary | ICD-10-CM | POA: Diagnosis not present

## 2017-04-28 DIAGNOSIS — N289 Disorder of kidney and ureter, unspecified: Secondary | ICD-10-CM | POA: Diagnosis not present

## 2017-04-28 DIAGNOSIS — E162 Hypoglycemia, unspecified: Secondary | ICD-10-CM | POA: Insufficient documentation

## 2017-04-28 DIAGNOSIS — Z79899 Other long term (current) drug therapy: Secondary | ICD-10-CM | POA: Diagnosis not present

## 2017-04-28 DIAGNOSIS — I129 Hypertensive chronic kidney disease with stage 1 through stage 4 chronic kidney disease, or unspecified chronic kidney disease: Secondary | ICD-10-CM | POA: Diagnosis not present

## 2017-04-28 DIAGNOSIS — E161 Other hypoglycemia: Secondary | ICD-10-CM | POA: Diagnosis not present

## 2017-04-28 DIAGNOSIS — D649 Anemia, unspecified: Secondary | ICD-10-CM | POA: Insufficient documentation

## 2017-04-28 DIAGNOSIS — E11649 Type 2 diabetes mellitus with hypoglycemia without coma: Secondary | ICD-10-CM | POA: Diagnosis not present

## 2017-04-28 DIAGNOSIS — R404 Transient alteration of awareness: Secondary | ICD-10-CM | POA: Diagnosis not present

## 2017-04-28 DIAGNOSIS — Z87891 Personal history of nicotine dependence: Secondary | ICD-10-CM | POA: Insufficient documentation

## 2017-04-28 DIAGNOSIS — R55 Syncope and collapse: Secondary | ICD-10-CM | POA: Diagnosis present

## 2017-04-28 LAB — CBC WITH DIFFERENTIAL/PLATELET
BASOS ABS: 0 10*3/uL (ref 0.0–0.1)
BASOS PCT: 0 %
EOS ABS: 0.2 10*3/uL (ref 0.0–0.7)
EOS PCT: 3 %
HCT: 37.3 % — ABNORMAL LOW (ref 39.0–52.0)
Hemoglobin: 12.2 g/dL — ABNORMAL LOW (ref 13.0–17.0)
LYMPHS PCT: 26 %
Lymphs Abs: 1.5 10*3/uL (ref 0.7–4.0)
MCH: 29.5 pg (ref 26.0–34.0)
MCHC: 32.7 g/dL (ref 30.0–36.0)
MCV: 90.1 fL (ref 78.0–100.0)
MONO ABS: 0.2 10*3/uL (ref 0.1–1.0)
Monocytes Relative: 4 %
Neutro Abs: 4 10*3/uL (ref 1.7–7.7)
Neutrophils Relative %: 67 %
PLATELETS: 171 10*3/uL (ref 150–400)
RBC: 4.14 MIL/uL — AB (ref 4.22–5.81)
RDW: 13.1 % (ref 11.5–15.5)
WBC: 5.9 10*3/uL (ref 4.0–10.5)

## 2017-04-28 LAB — CBG MONITORING, ED
GLUCOSE-CAPILLARY: 100 mg/dL — AB (ref 65–99)
GLUCOSE-CAPILLARY: 130 mg/dL — AB (ref 65–99)
GLUCOSE-CAPILLARY: 88 mg/dL (ref 65–99)

## 2017-04-28 NOTE — ED Notes (Addendum)
Pt given [place] sandwich and orange juice per Dr. Roxanne Mins. Assessment completed and POC discussed with pt using pacific interpreter [place] interpreter.

## 2017-04-28 NOTE — ED Provider Notes (Signed)
[facility] DEPT Provider Note   CSN: [id number] Arrival date & time: 04/28/17  2250     History   Chief Complaint No chief complaint on file.   HPI Brian Owen is a 66 y.o. male.  The history is provided by the patient. A language interpreter was used.  History is confusing. Patient was brought in by EMS because of hypoglycemia. EMS reports blood glucose 14. He had apparently told him that he took extra insulin tonight for no apparent reason. However, he tells me that he has run out of all of his medication. He has not been checking his glucose at home, because he ran out of the lancets. No family is available for additional history. He states that he went to sleep and the next thing he knew, he was in the ambulance coming to the hospital. He does state that he ate dinner. He denies chest pain, dyspnea, nausea,.diaphoresis.  Past Medical History:  Diagnosis Date  . [place] 11/11/2015   legs/notes 11/11/2015  . Chronic kidney disease   . Heavy cigarette smoker   . Parasite infection 2016   "couldn't afford to have surgery done; just left it; lower legs"  . Uncontrolled type II diabetes mellitus ([place]) dx'd 2013    Patient Active Problem List   Diagnosis Date Noted  . Leg wound, left 02/11/2017  . Syncope 01/13/2017  . Postural dizziness with presyncope   . CKD (chronic kidney disease) 06/16/2016  . Underweight 01/17/2016  . Autonomic neuropathy due to diabetes ([place]) 11/29/2015  . Hyperglycemia 11/27/2015  . Vertigo 11/27/2015  . Orthostatic hypotension 11/27/2015  . SVT (supraventricular tachycardia) ([place]) 11/11/2015  . Protein-calorie malnutrition, severe ([place]) 07/26/2014  . History of tobacco use 06/27/2014  . Edentulism, complete 06/27/2014  . Language barrier to communication 06/27/2014  . HTN (hypertension) 06/27/2014  . Hyponatremia 05/13/2014  . Type 2 diabetes mellitus with hyperglycemia, with long-term current use of insulin ([place]) 05/13/2014    Past Surgical  History:  Procedure Laterality Date  . NO PAST SURGERIES         Home Medications    Prior to Admission medications   Medication Sig Start Date End Date Taking? Authorizing Provider  aspirin EC 81 MG tablet Take 81 mg by mouth daily.    [provider]  insulin aspart protamine- aspart (NOVOLOG MIX 70/30) (70-30) 100 UNIT/ML injection Inject 0.12 mLs (12 Units total) into the skin 2 (two) times daily with a meal. 03/20/17   [name], Josalyn, MD  midodrine (PROAMATINE) 5 MG tablet Take 5 mg by mouth 2 (two) times daily with a meal.    [provider]    Family History Family History  Problem Relation Age of Onset  . Diabetes Other   . Diabetes Unknown     Social History Social History  Substance Use Topics  . Smoking status: Former Smoker    Packs/day: 1.50    Years: 48.00    Types: Cigarettes    Quit date: 2015  . Smokeless tobacco: Never Used  . Alcohol use No     Allergies   Patient has no known allergies.   Review of Systems Review of Systems  All other systems reviewed and are negative.    Physical Exam Updated Vital Signs BP (!) 164/100   Pulse 95   Temp 97.6 F (36.4 C) (Oral)   Resp 20   Ht 5\' 5"  (1.651 m)   Wt 54 kg (119 lb)   SpO2 100%   BMI  19.80 kg/m   Physical Exam  Nursing note and vitals reviewed.  66 year old male, resting comfortably and in no acute distress. Vital signs are significant for hypertension. Oxygen saturation is 100%, which is normal. Head is normocephalic and atraumatic. PERRLA, EOMI. Oropharynx is clear. Neck is nontender and supple without adenopathy or JVD. Back is nontender and there is no CVA tenderness. Lungs are clear without rales, wheezes, or rhonchi. Chest is nontender. Heart has regular rate and rhythm without murmur. Abdomen is soft, flat, nontender without masses or hepatosplenomegaly and peristalsis is normoactive. Extremities have no cyanosis or edema, full range of motion is  present. Skin is warm and dry without rash. Neurologic: Mental status is normal, cranial nerves are intact, there are no motor or sensory deficits.  ED Treatments / Results  Labs (all labs ordered are listed, but only abnormal results are displayed) Labs Reviewed  BASIC METABOLIC PANEL - Abnormal; Notable for the following:       Result Value   Glucose, Bld 124 (*)    Creatinine, Ser 1.50 (*)    GFR calc non Af Amer 47 (*)    GFR calc Af Amer 54 (*)    All other components within normal limits  CBC WITH DIFFERENTIAL/PLATELET - Abnormal; Notable for the following:    RBC 4.14 (*)    Hemoglobin 12.2 (*)    HCT 37.3 (*)    All other components within normal limits  CBG MONITORING, ED - Abnormal; Notable for the following:    Glucose-Capillary 130 (*)    All other components within normal limits  CBG MONITORING, ED - Abnormal; Notable for the following:    Glucose-Capillary 100 (*)    All other components within normal limits  CBG MONITORING, ED - Abnormal; Notable for the following:    Glucose-Capillary 133 (*)    All other components within normal limits  CBG MONITORING, ED - Abnormal; Notable for the following:    Glucose-Capillary 152 (*)    All other components within normal limits  CBG MONITORING, ED - Abnormal; Notable for the following:    Glucose-Capillary 175 (*)    All other components within normal limits  CBG MONITORING, ED    Procedures Procedures (including critical care time)  Medications Ordered in ED Medications - No data to display   Initial Impression / Assessment and Plan / ED Course  I have reviewed the triage vital signs and the nursing notes.  Pertinent lab results that were available during my care of the patient were reviewed by me and considered in my medical decision making (see chart for details).  Episode of hypoglycemia. Old records are reviewed, and he has prior ED visits for both hypoglycemia and hyperglycemia. He will be observed in the  ED to make sure that his glucose is stable.  Glucose started dropping down to 88. He was given a meal and observed. Glucose has not shown any sign of falling for 2 hours after his meal. He was felt to be safe for discharge at this point. Laboratory workup shows mild anemia which has improved over baseline. Also, mild renal insufficiency which is unchanged from baseline. He is instructed to return to his PCP to clarify what his insulin dose should be.  Final Clinical Impressions(s) / ED Diagnoses   Final diagnoses:  Hypoglycemia  Renal insufficiency  Normochromic normocytic anemia    New Prescriptions New Prescriptions   No medications on file     Delora Fuel, MD 25/05/39 [phone]

## 2017-04-28 NOTE — ED Triage Notes (Signed)
Pt from home via EMS for hypoglycemia. Per EMS, fire department called to pt house twice this week for same complaint. Upon the first visit to pt's home, pt CBG 14 on arrival, tonight pt CBG 17 by EMS. Pt reports tonight he took extra insulin "just because". Pt A&Ox4. 18 G R forearm, given 25 g D10 en route. Pt CBG initially 171 after administration, CBG 130 currently. 210/120, 94 bpm.

## 2017-04-29 LAB — BASIC METABOLIC PANEL
ANION GAP: 7 (ref 5–15)
BUN: 12 mg/dL (ref 6–20)
CALCIUM: 9 mg/dL (ref 8.9–10.3)
CO2: 27 mmol/L (ref 22–32)
Chloride: 101 mmol/L (ref 101–111)
Creatinine, Ser: 1.5 mg/dL — ABNORMAL HIGH (ref 0.61–1.24)
GFR, EST AFRICAN AMERICAN: 54 mL/min — AB (ref >60)
GFR, EST NON AFRICAN AMERICAN: 47 mL/min — AB (ref >60)
Glucose, Bld: 124 mg/dL — ABNORMAL HIGH (ref 65–99)
POTASSIUM: 3.8 mmol/L (ref 3.5–5.1)
Sodium: 135 mmol/L (ref 135–145)

## 2017-04-29 LAB — CBG MONITORING, ED
GLUCOSE-CAPILLARY: 152 mg/dL — AB (ref 65–99)
GLUCOSE-CAPILLARY: 175 mg/dL — AB (ref 65–99)
Glucose-Capillary: 133 mg/dL — ABNORMAL HIGH (ref 65–99)
Glucose-Capillary: 155 mg/dL — ABNORMAL HIGH (ref 65–99)

## 2017-04-29 NOTE — ED Notes (Signed)
Able to reach pt family member, pt family member states "can't he just stay there overnight". This nurse explained pt is being d/c to home and will have to wait in the waiting room if no one comes to pick up pt due to no busses running this evening. Pt family agreed to pick up pt to drive him home safely. When attempting contact pacific interpreter for [place] translator for d/c instructions, no translator available at this time. Recommended to this nurse to call back in approx 30-60 minutes for [place] interpreter.

## 2017-04-29 NOTE — ED Notes (Signed)
Attempted to call family x 2 to pick pt up from hospital. No response. Will continue to try to find ride home for patient.

## 2017-04-29 NOTE — ED Notes (Addendum)
Checked CBG 155, RN Anna informed

## 2017-04-29 NOTE — Discharge Instructions (Signed)
Talk with your doctor about your Insulin dose. Do not take extra Insulin unless instructed to do so by your doctor.

## 2017-05-02 ENCOUNTER — Telehealth: Payer: Self-pay | Admitting: Family Medicine

## 2017-05-02 DIAGNOSIS — I129 Hypertensive chronic kidney disease with stage 1 through stage 4 chronic kidney disease, or unspecified chronic kidney disease: Secondary | ICD-10-CM | POA: Diagnosis not present

## 2017-05-02 DIAGNOSIS — N183 Chronic kidney disease, stage 3 (moderate): Secondary | ICD-10-CM | POA: Diagnosis not present

## 2017-05-02 DIAGNOSIS — E1122 Type 2 diabetes mellitus with diabetic chronic kidney disease: Secondary | ICD-10-CM | POA: Diagnosis not present

## 2017-05-02 DIAGNOSIS — L97221 Non-pressure chronic ulcer of left calf limited to breakdown of skin: Secondary | ICD-10-CM | POA: Diagnosis not present

## 2017-05-02 DIAGNOSIS — E1143 Type 2 diabetes mellitus with diabetic autonomic (poly)neuropathy: Secondary | ICD-10-CM | POA: Diagnosis not present

## 2017-05-02 DIAGNOSIS — E43 Unspecified severe protein-calorie malnutrition: Secondary | ICD-10-CM | POA: Diagnosis not present

## 2017-05-02 DIAGNOSIS — Z794 Long term (current) use of insulin: Principal | ICD-10-CM

## 2017-05-02 DIAGNOSIS — E1165 Type 2 diabetes mellitus with hyperglycemia: Secondary | ICD-10-CM

## 2017-05-02 MED ORDER — GLUCOSE BLOOD VI STRP: 1.0000 | ORAL_STRIP | Freq: Three times a day (TID) | 12 refills | Status: DC

## 2017-05-02 MED ORDER — ACCU-CHEK AVIVA PLUS W/DEVICE KIT: 1.0000 | PACK | Freq: Three times a day (TID) | 0 refills | Status: DC

## 2017-05-02 MED ORDER — ACCU-CHEK SOFTCLIX LANCETS MISC: 1.0000 | Freq: Three times a day (TID) | 12 refills | Status: DC

## 2017-05-02 MED ORDER — ALCOHOL SWABS PADS: 1.0000 | MEDICATED_PAD | Freq: Three times a day (TID) | 11 refills | Status: DC

## 2017-05-02 MED ORDER — INSULIN GLARGINE 100 UNIT/ML SOLOSTAR PEN: 10.0000 [IU] | PEN_INJECTOR | Freq: Every day | SUBCUTANEOUS | 2 refills | Status: DC

## 2017-05-02 MED ORDER — INSULIN PEN NEEDLE 31G X 8 MM MISC: 1.0000 "application " | Freq: Every day | 3 refills | Status: DC

## 2017-05-02 NOTE — Telephone Encounter (Signed)
Lauren was called and a VM was left informing her on instruction for pt.

## 2017-05-02 NOTE — Telephone Encounter (Signed)
Please call back to [facility]. Per my review of the chart he sugar was reported low without insulin  His CBGs in the ED ranged from 100-175 I have re-ordered testing supplies He is advised to stop novolog 70/30 due to risk of low sugar 10 U of lantus once daily ordered, if lantus not covered levemir or toujeo at 10 U are options

## 2017-05-02 NOTE — Telephone Encounter (Signed)
Lauren from [facility] called to let pt. PCP know that he went to the ED last Sunday due to his DM being 19. Pt. Has not been taking his medication and has no strips to check his DM. Please f/u

## 2017-05-02 NOTE — Telephone Encounter (Signed)
Will route to PCP 

## 2017-05-03 ENCOUNTER — Other Ambulatory Visit: Payer: Self-pay

## 2017-05-03 ENCOUNTER — Emergency Department (HOSPITAL_COMMUNITY)
Admission: EM | Admit: 2017-05-03 | Discharge: 2017-05-03 | Disposition: A | Discharge status: Home / Self Care | Payer: Medicare Other | Attending: Emergency Medicine | Admitting: Emergency Medicine

## 2017-05-03 ENCOUNTER — Encounter (HOSPITAL_COMMUNITY): Payer: Self-pay | Admitting: Emergency Medicine

## 2017-05-03 DIAGNOSIS — E162 Hypoglycemia, unspecified: Secondary | ICD-10-CM

## 2017-05-03 DIAGNOSIS — E11649 Type 2 diabetes mellitus with hypoglycemia without coma: Secondary | ICD-10-CM | POA: Insufficient documentation

## 2017-05-03 DIAGNOSIS — I129 Hypertensive chronic kidney disease with stage 1 through stage 4 chronic kidney disease, or unspecified chronic kidney disease: Secondary | ICD-10-CM | POA: Insufficient documentation

## 2017-05-03 DIAGNOSIS — Z794 Long term (current) use of insulin: Secondary | ICD-10-CM | POA: Insufficient documentation

## 2017-05-03 DIAGNOSIS — N189 Chronic kidney disease, unspecified: Secondary | ICD-10-CM | POA: Diagnosis not present

## 2017-05-03 DIAGNOSIS — N390 Urinary tract infection, site not specified: Secondary | ICD-10-CM | POA: Diagnosis not present

## 2017-05-03 DIAGNOSIS — Z7982 Long term (current) use of aspirin: Secondary | ICD-10-CM | POA: Insufficient documentation

## 2017-05-03 DIAGNOSIS — Z87891 Personal history of nicotine dependence: Secondary | ICD-10-CM | POA: Diagnosis not present

## 2017-05-03 DIAGNOSIS — E161 Other hypoglycemia: Secondary | ICD-10-CM | POA: Diagnosis not present

## 2017-05-03 LAB — CBG MONITORING, ED
GLUCOSE-CAPILLARY: 125 mg/dL — AB (ref 65–99)
GLUCOSE-CAPILLARY: 138 mg/dL — AB (ref 65–99)
Glucose-Capillary: 151 mg/dL — ABNORMAL HIGH (ref 65–99)
Glucose-Capillary: 157 mg/dL — ABNORMAL HIGH (ref 65–99)
Glucose-Capillary: 148 mg/dL — ABNORMAL HIGH (ref 65–99)
Glucose-Capillary: 148 mg/dL — ABNORMAL HIGH (ref 65–99)

## 2017-05-03 LAB — BASIC METABOLIC PANEL
ANION GAP: 6 (ref 5–15)
BUN: 15 mg/dL (ref 6–20)
CALCIUM: 9.1 mg/dL (ref 8.9–10.3)
CO2: 28 mmol/L (ref 22–32)
Chloride: 103 mmol/L (ref 101–111)
Creatinine, Ser: 1.48 mg/dL — ABNORMAL HIGH (ref 0.61–1.24)
GFR, EST AFRICAN AMERICAN: 55 mL/min — AB (ref >60)
GFR, EST NON AFRICAN AMERICAN: 48 mL/min — AB (ref >60)
GLUCOSE: 112 mg/dL — AB (ref 65–99)
Potassium: 4.1 mmol/L (ref 3.5–5.1)
Sodium: 137 mmol/L (ref 135–145)

## 2017-05-03 LAB — CBC
HCT: 34.8 % — ABNORMAL LOW (ref 39.0–52.0)
Hemoglobin: 11.3 g/dL — ABNORMAL LOW (ref 13.0–17.0)
MCH: 29.3 pg (ref 26.0–34.0)
MCHC: 32.5 g/dL (ref 30.0–36.0)
MCV: 90.2 fL (ref 78.0–100.0)
PLATELETS: 174 10*3/uL (ref 150–400)
RBC: 3.86 MIL/uL — AB (ref 4.22–5.81)
RDW: 13.4 % (ref 11.5–15.5)
WBC: 9 10*3/uL (ref 4.0–10.5)

## 2017-05-03 LAB — URINALYSIS, ROUTINE W REFLEX MICROSCOPIC
BILIRUBIN URINE: NEGATIVE
Bacteria, UA: NONE SEEN
GLUCOSE, UA: 50 mg/dL — AB
KETONES UR: NEGATIVE mg/dL
NITRITE: NEGATIVE
PH: 7 (ref 5.0–8.0)
PROTEIN: NEGATIVE mg/dL
SQUAMOUS EPITHELIAL / LPF: NONE SEEN
Specific Gravity, Urine: 1.005 (ref 1.005–1.030)

## 2017-05-03 MED ORDER — CIPROFLOXACIN HCL 500 MG PO TABS: 500.0000 mg | ORAL_TABLET | Freq: Two times a day (BID) | ORAL | 0 refills | Status: DC

## 2017-05-03 NOTE — ED Notes (Signed)
Instructions to pt and family with understanding verbalized. Home stable with daughter after speaking with Education officer, museum.

## 2017-05-03 NOTE — Discharge Planning (Signed)
[facility] consulted regarding possible SNF placement for this pt.  Pt will not likely get placed from ED as he does not have a recent 3-day qualifying in-patient hospital stay.  Pt is active at [facility] and has [facility] following his care.  EDCM placed call to [facility] to discuss additional resources for this pt.  EDCM aware of pt family refusing to pick him up and need for APS report by EDSW.  EDCM will continue to follow for disposition needs.

## 2017-05-03 NOTE — Discharge Planning (Signed)
EDCM spoke with Tish Men, RN with [facility].  Updated [place] on pt admission and plan for discharge home.  [place] will follow up with pt in the home.

## 2017-05-03 NOTE — ED Notes (Signed)
Pt. assisted to use the toilet , ambulated with stand-by assist.

## 2017-05-03 NOTE — ED Triage Notes (Signed)
Per EMS, pt found unresponsive at home, CBG-21, given 25g D50 PTA, repeat CBG-173. Pt speaks [place], A&O x 4. Takes insulin, unsure of amount/type. EMS vitals: BP-164/128, P-95, RR-18, SpO2-100% room air.

## 2017-05-03 NOTE — Patient Outreach (Addendum)
[facility]) Care Management  05/03/17  [name] 12/28/1950 [id number]  RNCM received voicemail from Fuller Mandril, RN case manager at [facility] emergency department regarding patient, requesting call back.  Successful outreach completed with [name]. Per [name], patient came in with second visit to ED for low blood sugar. Last night, family refused to come and pick him up once he was stabilized. This morning, they were discussing calling adult protective services and family were finally agreeable to come and pick him up. She stated doctors were concerned this was his second admission. [facility] RNCM to make outreach today to follow up with patient.  RNCM attempted to reach patient without success. Phone rang with no answer and no option to leave a voicemail.  [place] R. Whitni Pasquini, RN, BSN, [place] Management Coordinator [phone]

## 2017-05-03 NOTE — ED Notes (Signed)
Pt assisted with setting up breakfast. Denies complaint

## 2017-05-03 NOTE — ED Notes (Signed)
Family at bedside. SW made aware.

## 2017-05-03 NOTE — ED Notes (Signed)
Spoke with Daughter who called on phone. Informed daughter that pt was being discharged. She states she will come to get father.

## 2017-05-03 NOTE — Discharge Planning (Signed)
EDCM informed of pt family returning call and will come pick pt up.  Relayed information to EDSW.

## 2017-05-03 NOTE — ED Notes (Signed)
CBG 148 

## 2017-05-03 NOTE — ED Notes (Signed)
Pt. given orange juice , apple sauce and [place] sandwich .

## 2017-05-03 NOTE — ED Provider Notes (Signed)
[facility] DEPT Provider Note   CSN: [id number] Arrival date & time: 05/03/17  0137  By signing my name below, I, Ny'Kea Lewis, attest that this documentation has been prepared under the direction and in the presence of Deshane Cotroneo, Gwenyth Allegra, *. Electronically Signed: Lise Auer, ED Scribe. 05/03/17. 2:17 AM.  History   Chief Complaint Chief Complaint  Patient presents with  . Hypoglycemia   The history is provided by the patient and the EMS personnel. A language interpreter was used Public house manager [phone] Bar).   HPI Comments: Stokes Rattigan is a 66 y.o. male brought in by ambulance, who presents to the Emergency Department for hypoglycemia. Per EMS, pt's daughter gave the pt 12 units of insulin last night. He reports he takes "two syringes of insulin" day and night. Pt states he ate dinner tonight.  Denies any recent sickness. He reports at this time he feels better and denies any pain. Pt was seen in the ED on 7/1 with the same complaint. He was given a meal and observed, he was cleared for discharge and told to follow up with PCP to clarify the dosage of insulin needed. Denies chest pain, abdominal pain, nausea, emesis, or diarrhea.   Past Medical History:  Diagnosis Date  . Bull[place] 11/11/2015   legs/notes 11/11/2015  . Chronic kidney disease   . Heavy cigarette smoker   . Parasite infection 2016   "couldn't afford to have surgery done; just left it; lower legs"  . Uncontrolled type II diabetes mellitus ([place]) dx'd 2013    Patient Active Problem List   Diagnosis Date Noted  . Leg wound, left 02/11/2017  . Syncope 01/13/2017  . Postural dizziness with presyncope   . CKD (chronic kidney disease) 06/16/2016  . Underweight 01/17/2016  . Autonomic neuropathy due to diabetes ([place]) 11/29/2015  . Hyperglycemia 11/27/2015  . Vertigo 11/27/2015  . Orthostatic hypotension 11/27/2015  . SVT (supraventricular tachycardia) ([place]) 11/11/2015  . Protein-calorie malnutrition, severe  ([place]) 07/26/2014  . History of tobacco use 06/27/2014  . Edentulism, complete 06/27/2014  . Language barrier to communication 06/27/2014  . HTN (hypertension) 06/27/2014  . Hyponatremia 05/13/2014  . Type 2 diabetes mellitus with hyperglycemia, with long-term current use of insulin ([place]) 05/13/2014    Past Surgical History:  Procedure Laterality Date  . NO PAST SURGERIES      Home Medications    Prior to Admission medications   Medication Sig Start Date End Date Taking? Authorizing Provider  ACCU-CHEK SOFTCLIX LANCETS lancets 1 each by Other route 3 (three) times daily. 05/02/17   Boykin Nearing, MD  Alcohol Swabs PADS 1 each by Does not apply route 3 (three) times daily. 05/02/17   Boykin Nearing, MD  aspirin EC 81 MG tablet Take 81 mg by mouth daily.    [provider]  Blood Glucose Monitoring Suppl (ACCU-CHEK AVIVA PLUS) w/Device KIT 1 Device by Does not apply route 3 (three) times daily after meals. 05/02/17   [name], Adriana Mccallum, MD  glucose blood (ACCU-CHEK AVIVA PLUS) test strip 1 each by Other route 3 (three) times daily. 05/02/17   [name], Adriana Mccallum, MD  Insulin Glargine (LANTUS SOLOSTAR) 100 UNIT/ML Solostar Pen Inject 10 Units into the skin daily at 10 pm. 05/02/17   [name], Adriana Mccallum, MD  Insulin Pen Needle (B-D ULTRAFINE III SHORT PEN) 31G X 8 MM MISC 1 application by Does not apply route daily. 05/02/17   [name], Adriana Mccallum, MD  midodrine (PROAMATINE) 5 MG tablet Take 5 mg by mouth 2 (two) times  daily with a meal.    [provider]   Family History Family History  Problem Relation Age of Onset  . Diabetes Other   . Diabetes Unknown     Social History Social History  Substance Use Topics  . Smoking status: Former Smoker    Packs/day: 1.50    Years: 48.00    Types: Cigarettes    Quit date: 2015  . Smokeless tobacco: Never Used  . Alcohol use No   Allergies   Patient has no known allergies.  Review of Systems Review of Systems  Cardiovascular:  Negative for chest pain.  Gastrointestinal: Negative for abdominal pain, diarrhea, nausea and vomiting.  Endocrine:       Hypoglycemia.  All other systems reviewed and are negative.  Physical Exam Updated Vital Signs BP (!) 145/85 (BP Location: Right Arm)   Pulse 82   Temp (!) 97.4 F (36.3 C) (Oral)   Resp 20   SpO2 99%   Physical Exam  Constitutional: He is oriented to person, place, and time. He appears well-developed and well-nourished. No distress.  HENT:  Head: Normocephalic and atraumatic.  Right Ear: Hearing normal.  Left Ear: Hearing normal.  Nose: Nose normal.  Mouth/Throat: Oropharynx is clear and moist and mucous membranes are normal.  Eyes: Conjunctivae and EOM are normal. Pupils are equal, round, and reactive to light.  Neck: Normal range of motion. Neck supple.  Cardiovascular: Regular rhythm, S1 normal and S2 normal.  Exam reveals no gallop and no friction rub.   No murmur heard. Pulmonary/Chest: Effort normal and breath sounds normal. No respiratory distress. He exhibits no tenderness.  Abdominal: Soft. Normal appearance and bowel sounds are normal. There is no hepatosplenomegaly. There is no tenderness. There is no rebound, no guarding, no tenderness at McBurney's point and negative Murphy's sign. No hernia.  Musculoskeletal: Normal range of motion.  Neurological: He is alert and oriented to person, place, and time. He has normal strength. No cranial nerve deficit or sensory deficit. Coordination normal. GCS eye subscore is 4. GCS verbal subscore is 5. GCS motor subscore is 6.  Skin: Skin is warm, dry and intact. No rash noted. No cyanosis.  Psychiatric: He has a normal mood and affect. His speech is normal and behavior is normal. Thought content normal.  Nursing note and vitals reviewed.    ED Treatments / Results  DIAGNOSTIC STUDIES: Oxygen Saturation is 100% on RA, normal by my interpretation.   COORDINATION OF CARE: 2:06 AM-Discussed next steps with  pt. Pt verbalized understanding and is agreeable with the plan.   Labs (all labs ordered are listed, but only abnormal results are displayed) Labs Reviewed  CBC - Abnormal; Notable for the following:       Result Value   RBC 3.86 (*)    Hemoglobin 11.3 (*)    HCT 34.8 (*)    All other components within normal limits  BASIC METABOLIC PANEL - Abnormal; Notable for the following:    Glucose, Bld 112 (*)    Creatinine, Ser 1.48 (*)    GFR calc non Af Amer 48 (*)    GFR calc Af Amer 55 (*)    All other components within normal limits  URINALYSIS, ROUTINE W REFLEX MICROSCOPIC - Abnormal; Notable for the following:    Color, Urine COLORLESS (*)    Glucose, UA 50 (*)    Hgb urine dipstick SMALL (*)    Leukocytes, UA MODERATE (*)    All other components within normal  limits  CBG MONITORING, ED - Abnormal; Notable for the following:    Glucose-Capillary 125 (*)    All other components within normal limits  CBG MONITORING, ED - Abnormal; Notable for the following:    Glucose-Capillary 151 (*)    All other components within normal limits  CBG MONITORING, ED - Abnormal; Notable for the following:    Glucose-Capillary 157 (*)    All other components within normal limits  CBG MONITORING, ED - Abnormal; Notable for the following:    Glucose-Capillary 138 (*)    All other components within normal limits    EKG  EKG Interpretation None       Radiology No results found.  Procedures Procedures (including critical care time)  Medications Ordered in ED Medications - No data to display   Initial Impression / Assessment and Plan / ED Course  I have reviewed the triage vital signs and the nursing notes.  Pertinent labs & imaging results that were available during my care of the patient were reviewed by me and considered in my medical decision making (see chart for details).     Patient presents to the emergency department for evaluation of hypoglycemia. Patient presents for the  second time this week. EMS reports that they were told that the patient was given his insulin tonight but hasn't eaten much. No family has come to the ER. Patient speaks [place], interaction via interpreter. He is without complaints. No recent illness. Lab work unremarkable.  I am concerned that the family is not caring for the patient appropriately. This is his second visit for hypoglycemia. He has been getting insulin and it's not clear if he is getting the correct doses. He tells me today that he got 12 units of insulin and may have caught and 2 doses today. He is only supposed to be getting 10 units once a day. He also may not have eaten much today despite getting insulin. He never had his blood sugar checked today prior to administration of the insulin. Patient will have social work consult in the morning. Family refused to come to the ER tonight would not come to pick him up.  Final Clinical Impressions(s) / ED Diagnoses   Final diagnoses:  Hypoglycemia    New Prescriptions New Prescriptions   No medications on file  I personally performed the services described in this documentation, which was scribed in my presence. The recorded information has been reviewed and is accurate.     Orpah Greek, MD 05/03/17 [phone]

## 2017-05-04 LAB — URINE CULTURE: Culture: <10000 — AB

## 2017-05-06 IMAGING — DX DG CHEST 2V
2 series · 2 of 2 positions shown · non-contrast
Comparison: Chest radiograph from 04/20/2015

CLINICAL DATA: Acute onset of shortness of breath, dizziness and
hyperglycemia. Initial encounter.

EXAM:
CHEST  2 VIEW

[chest pa]
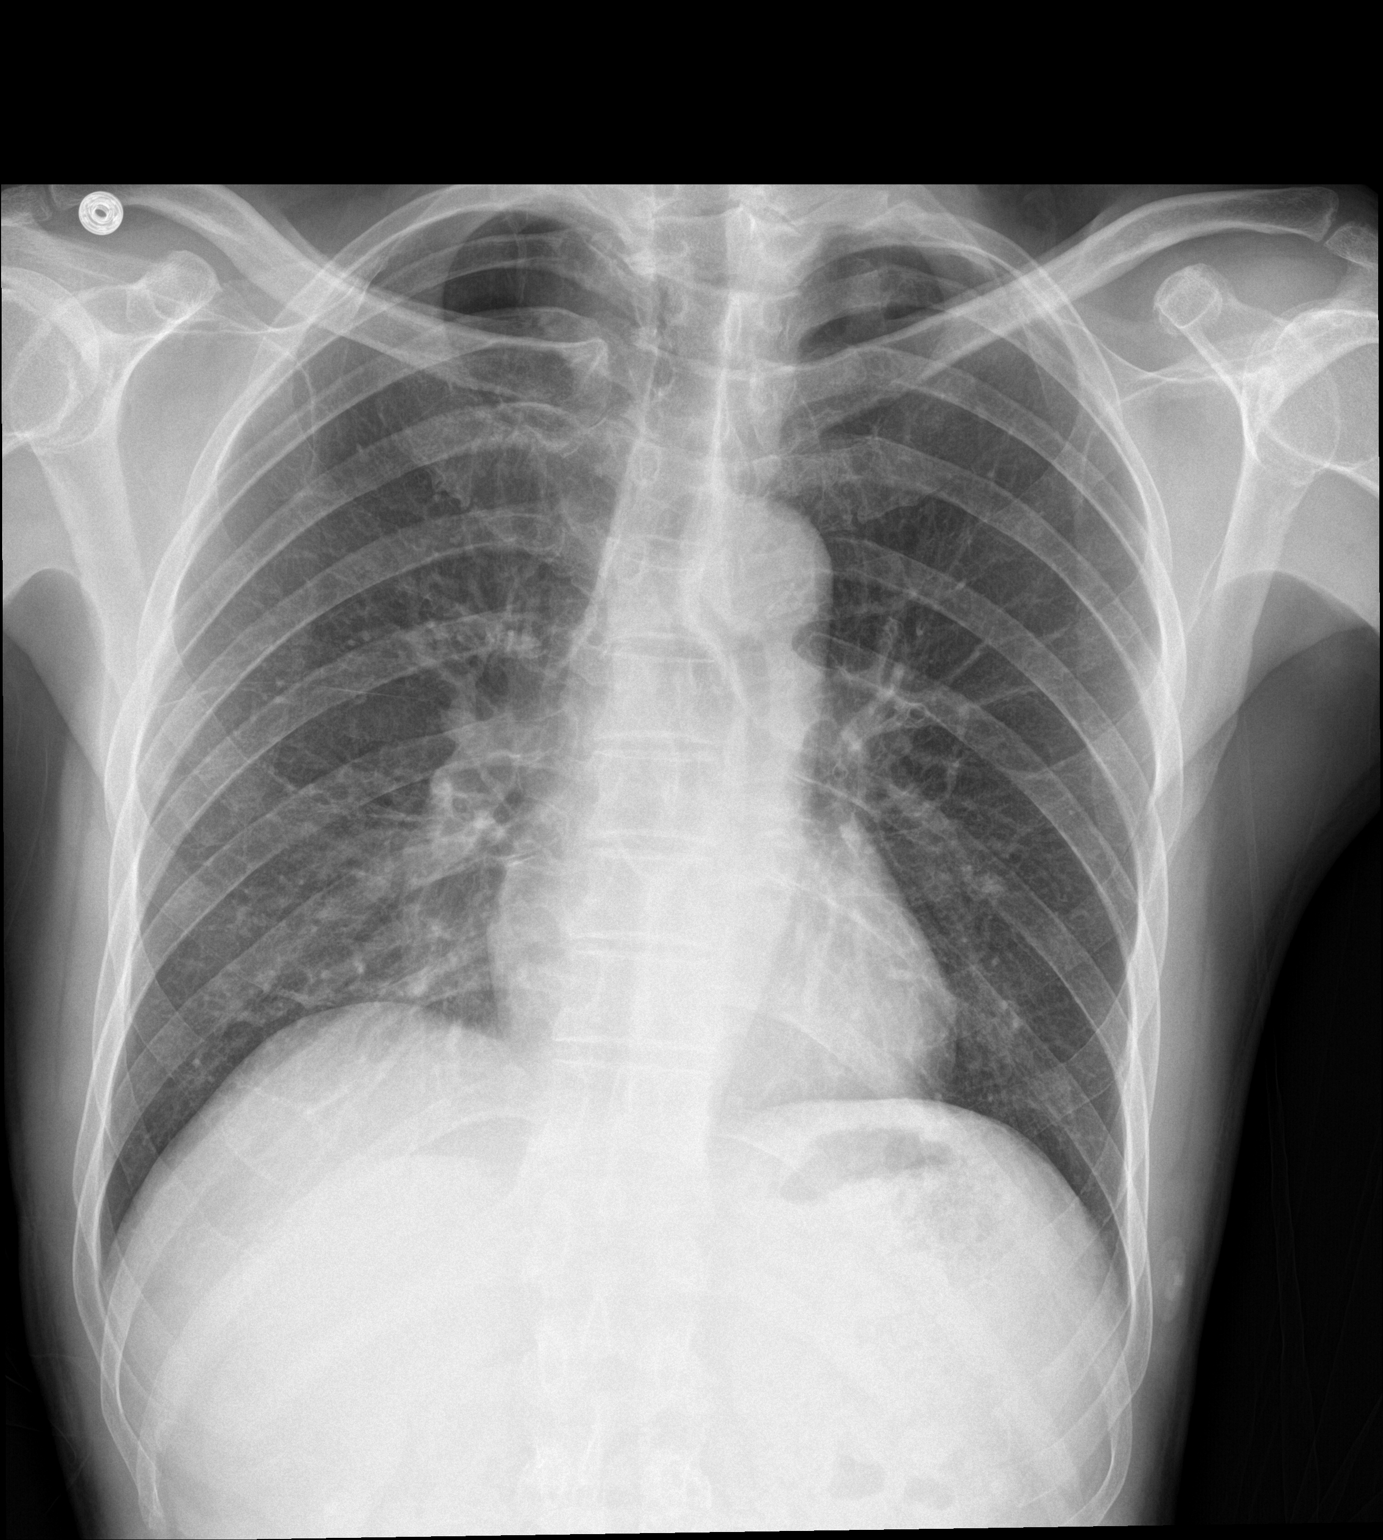

[chest lat]
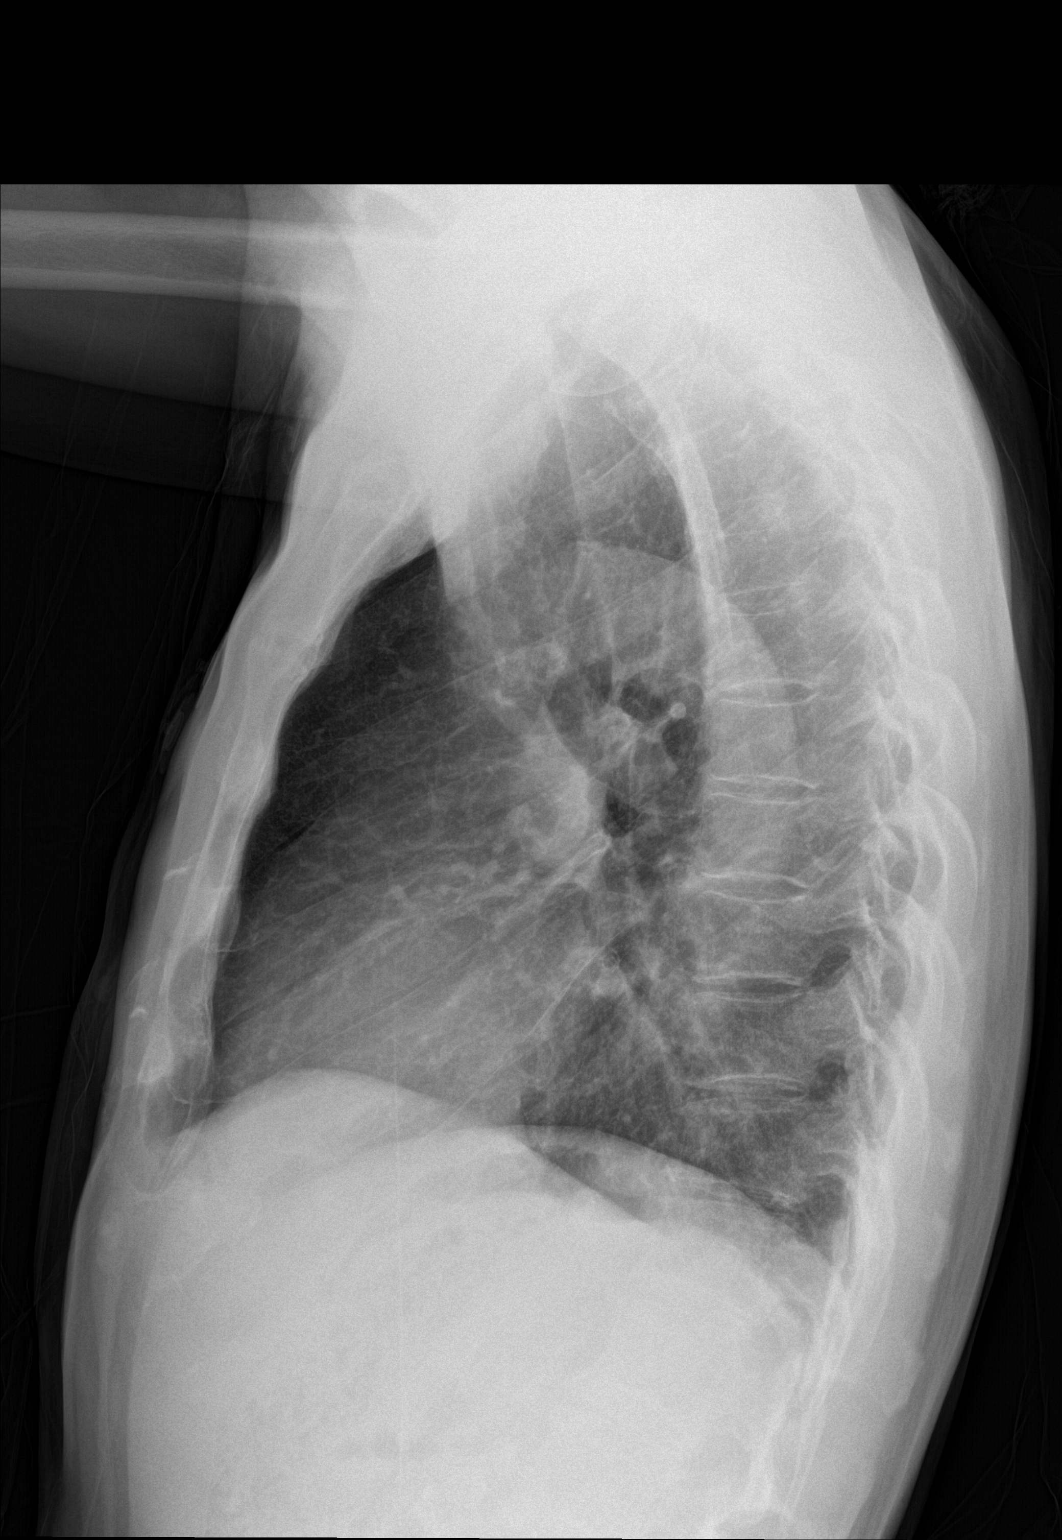

[2 of 2 positions shown; findings below may reference images not displayed]

FINDINGS: The lungs are well-aerated. Right basilar airspace opacity raises
concern for mild pneumonia. There is no evidence of pleural effusion
or pneumothorax.

The heart is normal in size; the mediastinal contour is within
normal limits. No acute osseous abnormalities are seen.
IMPRESSION: Mild right basilar airspace opacity raises concern for mild
pneumonia.

## 2017-05-08 ENCOUNTER — Other Ambulatory Visit: Payer: Self-pay

## 2017-05-08 ENCOUNTER — Telehealth: Payer: Self-pay | Admitting: Pharmacist

## 2017-05-08 DIAGNOSIS — I129 Hypertensive chronic kidney disease with stage 1 through stage 4 chronic kidney disease, or unspecified chronic kidney disease: Secondary | ICD-10-CM | POA: Diagnosis not present

## 2017-05-08 DIAGNOSIS — N183 Chronic kidney disease, stage 3 (moderate): Secondary | ICD-10-CM | POA: Diagnosis not present

## 2017-05-08 DIAGNOSIS — L97221 Non-pressure chronic ulcer of left calf limited to breakdown of skin: Secondary | ICD-10-CM | POA: Diagnosis not present

## 2017-05-08 DIAGNOSIS — E1122 Type 2 diabetes mellitus with diabetic chronic kidney disease: Secondary | ICD-10-CM | POA: Diagnosis not present

## 2017-05-08 DIAGNOSIS — E43 Unspecified severe protein-calorie malnutrition: Secondary | ICD-10-CM | POA: Diagnosis not present

## 2017-05-08 DIAGNOSIS — E1143 Type 2 diabetes mellitus with diabetic autonomic (poly)neuropathy: Secondary | ICD-10-CM | POA: Diagnosis not present

## 2017-05-08 NOTE — Telephone Encounter (Signed)
Received call from [facility], patient needs assistance affording medications. He was told to stop the Novolog but has not been able to because he cannot afford the Lantus or testing supplies. Patient has Medicare, therefore our pharmacy cannot assist. Recommended patient follow up with [facility]. I will also reach out to Deanne Coffer, PharmD at [facility] for assistance.

## 2017-05-08 NOTE — Patient Outreach (Signed)
[name]) Care Management  05/08/17  Brian Owen 07/28/1951 [id number]  COLLABORATION WITH [facility]  RNCM received voicemail from Dawson with [place] at [phone] stating that patient needs assistance with paying for medications. She stated that he had been switched from Novolog to Lantus and he cannot afford the Lantus or his test strips.  RNCM returned call to [place] and spoke with her regarding patient. Per Ander Purpura, she stated that she was told by patient's daughter, Inocente Salles that his medications cost around $22 but it appeared that the caregiver is not able or not willing to pay for anything and patient cannot affird it. She stated that the novolog was discontinued due to patient's recent ED visits due to extreme hypoglycemia, with one time his sugar was 17 and he was started on Lantus. However, patient has not yet started Lantus. She stated that she has tried several times to explain the importance of his diagnosis and managing it and that it is as if no one comprehends the seriousness. RNCM advised of use of interpreter and still run into the same issues. The patient is very apathetic about his own care and passes it off to his granddaughter. Advised that his daughter Inocente Salles had stated that she was going to take over care and begin checking patient's sugar and giving his insulin to him. Lauren was able to confirm that [name] had been checking his blood sugar and writing it in his log 3 times a day until he ran out of test strips. She has also been giving him his insulin without regards to his blood sugars because she has not been able to check it. She stated that the daughter reported that he "eats all day," and she is concerned he eats too much rice, but it is unclear if he is eating when he takes his insulin.  RNCM advised working with [facility] team to determine what options are available, if any to assist patient with getting his prescriptions.  [place] R.  Colt Martelle, RN, BSN, [place] Management Coordinator [phone]

## 2017-05-08 NOTE — Patient Outreach (Signed)
[facility]) Care Management  05/08/17  Atha Olivera  12-17-1950 [id number]  COLLABORATION WITH [facility]  RNCM successfully collaborated with Deanne Coffer, [place] regarding new concerns of patient not being able to afford his medications. She will work with patient's pharmacy and Theda Sers at PCP office to determine what assistance, if any is available. It appears that patient is likely already receiving assistance and there may not be much [facility] can do differently, other than possibly helping to cover the cost of his medications this month, however we will need to establish a long term plan for moving forward. RNCM advised planning to meet with patient and family in the home within the next 1-2 weeks and will address the importance of taking his medications, as well as address the seriousness of his diabetes and the possible complications associated with not managing it. Will also discuss the need to determine a plan for getting his prescriptions filled.   [place] R. Francies Inch, RN, BSN, [place] Management Coordinator [phone]

## 2017-05-08 NOTE — Patient Outreach (Signed)
[facility]) Care Management  05/08/17  Brian Owen 1951-05-23 [id number]  Call placed to [facility] line [phone]), spoke with [place], [place] ID # [id number]  and had her place call to patient. Brian Owen attempted to call the patient and stated that when she asked for the patient, the person, a male, immediately hung up the phone. RNCM requested that she attempt one more time. When Brian Owen returned, she stated that she attempted again and the same male answered the phone and said he would get his sister to the phone, then someone else was asked for and after a period of waiting for several minutes, she was hung up on again. She stated that she does not know what happened or why they did not want to speak, but she did use her [place] language when talking with them. RNCM thanked [place] for attempting to call and RNCM will attempt again to contact patient's daughter without the interpreter.  Successful outreach completed with patient's daughter Brian Owen. Patient identification verified via dob and address. Per "Brian Owen," she has been checking patient's blood sugar 3 times a day. When [facility] asked about today's blood sugar, she stated she has not been able to check it in a few weeks because she ran out of test strips. She stated that she has been going ahead and giving him his insulin though. Patient has not yet started his lantus as Brian Owen stated that he cannot afford to pick it up. She stated that he has 4 prescriptions that are ready to be picked up and that they cost around $22 and he cannot afford them and she cannot either. RNCM asked if she had requested a refill of his test strips when he ran out and she said "no."  RNCM verified patient's insurance, although she could not find all of patient's cards. She stated that patient has Medicare, and AARP supplement and [employer] prescription coverage (since 08/19/2014).  RNCM advised [facility] pharmacist is exploring  if there is any way to assist patient with his medications, but that it sounds as if he already has some form of assistance based on the price of his prescriptions. Advised someone would be reaching out to let her know if there is any way we can assist within the next few days. Will continue to explore ways to assist patient with his medications.  Plan: RNCM to follow up with [facility] pharmacist, and also will plan a home visit within next 1-2 weeks for discussion with patient and family about seriousness of patient's condition, complications of hypoglycemia, and assess family's willingness to help patient due to recent ED visit in which patient was left at ED and family initially declined to pick him up. Patient's daughter did meet goal of checking his blood sugar, until he ran out of test strips and she also gives him his insulin per conversation with Lauren with [facility]. There is a lack of knowledge as evidenced by not calling in prescription for test strips when patient ran out. Need to explore further what the capacity is for his family to assist him in the home and determine what new barriers exist.  [place] R. Shaneal Barasch, RN, BSN, [place] Management Coordinator [phone]

## 2017-05-09 ENCOUNTER — Telehealth: Payer: Self-pay | Admitting: Pharmacist

## 2017-05-09 NOTE — Telephone Encounter (Signed)
Received call from Deanne Coffer, PharmD at [facility]. Patient already has assistance with his medication through [employer], therefore there is no assistance left to offer. His insulin is about $8. There is a concern that patient's family will just not pay for the medications for him, even if they have the funds to do so. There is no additional assistance that can be provided as patient has a English as a second language teacher. Will share with Dr. Adrian Blackwater who is scheduled to see patient tomorrow.

## 2017-05-10 ENCOUNTER — Ambulatory Visit: Payer: Medicare Other | Attending: Family Medicine | Admitting: Family Medicine

## 2017-05-10 ENCOUNTER — Encounter: Payer: Self-pay | Admitting: Family Medicine

## 2017-05-10 VITALS — BP 143/85 | HR 83 | Temp 97.3°F | Ht 64.0 in | Wt 126.4 lb

## 2017-05-10 DIAGNOSIS — I129 Hypertensive chronic kidney disease with stage 1 through stage 4 chronic kidney disease, or unspecified chronic kidney disease: Secondary | ICD-10-CM | POA: Insufficient documentation

## 2017-05-10 DIAGNOSIS — Z7982 Long term (current) use of aspirin: Secondary | ICD-10-CM | POA: Diagnosis not present

## 2017-05-10 DIAGNOSIS — Z9114 Patient's other noncompliance with medication regimen: Secondary | ICD-10-CM | POA: Insufficient documentation

## 2017-05-10 DIAGNOSIS — Z87891 Personal history of nicotine dependence: Secondary | ICD-10-CM | POA: Insufficient documentation

## 2017-05-10 DIAGNOSIS — Z8619 Personal history of other infectious and parasitic diseases: Secondary | ICD-10-CM | POA: Insufficient documentation

## 2017-05-10 DIAGNOSIS — E1122 Type 2 diabetes mellitus with diabetic chronic kidney disease: Secondary | ICD-10-CM | POA: Diagnosis not present

## 2017-05-10 DIAGNOSIS — E1165 Type 2 diabetes mellitus with hyperglycemia: Secondary | ICD-10-CM | POA: Insufficient documentation

## 2017-05-10 DIAGNOSIS — N183 Chronic kidney disease, stage 3 (moderate): Secondary | ICD-10-CM | POA: Diagnosis not present

## 2017-05-10 DIAGNOSIS — Z794 Long term (current) use of insulin: Secondary | ICD-10-CM | POA: Insufficient documentation

## 2017-05-10 LAB — POCT GLYCOSYLATED HEMOGLOBIN (HGB A1C): HEMOGLOBIN A1C: 9.1

## 2017-05-10 LAB — GLUCOSE, POCT (MANUAL RESULT ENTRY): POC GLUCOSE: 267 mg/dL — AB (ref 70–99)

## 2017-05-10 MED ORDER — INSULIN GLARGINE 100 UNIT/ML SOLOSTAR PEN: 10.0000 [IU] | PEN_INJECTOR | Freq: Every day | SUBCUTANEOUS | 2 refills | Status: DC

## 2017-05-10 MED ORDER — INSULIN PEN NEEDLE 31G X 8 MM MISC: 1.0000 "application " | Freq: Every day | 3 refills | Status: DC

## 2017-05-10 MED ORDER — GLUCOSE BLOOD VI STRP: 1.0000 | ORAL_STRIP | Freq: Three times a day (TID) | 12 refills | Status: DC

## 2017-05-10 NOTE — Progress Notes (Signed)
Subjective:  Patient ID: Brian Owen, male    DOB: 06-10-1951  Age: 66 y.o. MRN: [id number]  CC: Diabetes   HPI Brian Owen has uncontrolled diabetes, labile CGBs, CKD stage 3,  HTN, hx of C.Diff diarrhea,  medication non-compliance he presents with his wife and interpreter  for    1. Uncontrolled diabetes:  Compliance is a problem: I called patient's [facility], Lantus is ready but has not been picked up. I called his [facility], novolog 70/30 was picked up on 05/03/17. Patient was transitioned off novolog 70/30 to lantus due to hypoglycemia. He reports he did not start lantus due to expense. He has also not picked up refills of testing supplies due to cost.    CHRONIC DIABETES  Disease Monitoring  Blood Sugar Ranges: 25- 400    Social History  Substance Use Topics  . Smoking status: Former Smoker    Packs/day: 1.50    Years: 48.00    Types: Cigarettes    Quit date: 2015  . Smokeless tobacco: Never Used  . Alcohol use No    Outpatient Medications Prior to Visit  Medication Sig Dispense Refill  . ACCU-CHEK SOFTCLIX LANCETS lancets 1 each by Other route 3 (three) times daily. 100 each 12  . Alcohol Swabs PADS 1 each by Does not apply route 3 (three) times daily. 100 each 11  . aspirin EC 81 MG tablet Take 81 mg by mouth daily.    . Blood Glucose Monitoring Suppl (ACCU-CHEK AVIVA PLUS) w/Device KIT 1 Device by Does not apply route 3 (three) times daily after meals. 1 kit 0  . ciprofloxacin (CIPRO) 500 MG tablet Take 1 tablet (500 mg total) by mouth 2 (two) times daily. 20 tablet 0  . glucose blood (ACCU-CHEK AVIVA PLUS) test strip 1 each by Other route 3 (three) times daily. 100 each 12  . Insulin Glargine (LANTUS SOLOSTAR) 100 UNIT/ML Solostar Pen Inject 10 Units into the skin daily at 10 pm. 5 pen 2  . Insulin Pen Needle (B-D ULTRAFINE III SHORT PEN) 31G X 8 MM MISC 1 application by Does not apply route daily. 100 each 3  . midodrine (PROAMATINE) 5 MG tablet Take 5 mg by mouth 2 (two)  times daily with a meal.     No facility-administered medications prior to visit.     ROS Review of Systems  Constitutional: Negative for appetite change, chills, fatigue, fever and unexpected weight change.  Eyes: Negative for visual disturbance.  Respiratory: Negative for cough and shortness of breath.   Cardiovascular: Negative for chest pain, palpitations and leg swelling.  Gastrointestinal: Negative for abdominal pain, blood in stool, constipation, diarrhea, nausea and vomiting.  Endocrine: Negative for polydipsia, polyphagia and polyuria.  Musculoskeletal: Negative for arthralgias, back pain, gait problem, myalgias and neck pain.  Skin: Negative for rash and wound.  Allergic/Immunologic: Negative for immunocompromised state.  Neurological: Negative for headaches.  Hematological: Negative for adenopathy. Does not bruise/bleed easily.  Psychiatric/Behavioral: Negative for dysphoric mood, sleep disturbance and suicidal ideas. The patient is not nervous/anxious.     Objective:  BP (!) 143/85   Pulse 83   Temp (!) 97.3 F (36.3 C) (Oral)   Ht _0  (1.626 m)   Wt 126 lb 6.4 oz (57.3 kg)   SpO2 100%   BMI 21.70 kg/m   BP/Weight 05/10/2017 02/02/4258 03/03/3874  Systolic BP 643 329 518  Diastolic BP 85 91 80  Wt. (Lbs) 126.4 - -  BMI 21.7 - -  Physical Exam  Constitutional: He appears well-developed and well-nourished. No distress.  HENT:  Head: Normocephalic and atraumatic.  Neck: Normal range of motion. Neck supple.  Cardiovascular: Normal rate, regular rhythm, normal heart sounds and intact distal pulses.   Pulses:      Radial pulses are 2+ on the right side, and 2+ on the left side.       Posterior tibial pulses are 1+ on the right side, and 1+ on the left side.  Pulmonary/Chest: Effort normal and breath sounds normal.  Abdominal: Soft. Bowel sounds are normal. He exhibits no distension and no mass. There is no tenderness. There is no rebound and no guarding.    Musculoskeletal: He exhibits no edema.  Neurological: He is alert.  Skin: Skin is warm and dry. No rash noted. No erythema.  Psychiatric: He has a normal mood and affect.   Lab Results  Component Value Date   HGBA1C 12.8 03/07/2017   Lab Results  Component Value Date   HGBA1C 9.1 05/10/2017   CBG 267    Chemistry      Component Value Date/Time   NA 137 05/03/2017 0222   K 4.1 05/03/2017 0222   CL 103 05/03/2017 0222   CO2 28 05/03/2017 0222   BUN 15 05/03/2017 0222   BUN 17 08/10/2014   CREATININE 1.48 (H) 05/03/2017 0222   CREATININE 1.28 (H) 01/17/2016 1102      Component Value Date/Time   CALCIUM 9.1 05/03/2017 0222   ALKPHOS 166 (H) 02/10/2017 1723   AST 21 02/10/2017 1723   ALT 26 02/10/2017 1723   BILITOT 0.6 02/10/2017 1723       Assessment & Plan:  Teri was seen today for diabetes.  Diagnoses and all orders for this visit:  Type 2 diabetes mellitus with hyperglycemia, with long-term current use of insulin (HCC) -     POCT glucose (manual entry) -     POCT glycosylated hemoglobin (Hb A1C) -     Insulin Glargine (LANTUS SOLOSTAR) 100 UNIT/ML Solostar Pen; Inject 10 Units into the skin daily at 10 pm. -     Insulin Pen Needle (B-D ULTRAFINE III SHORT PEN) 31G X 8 MM MISC; 1 application by Does not apply route daily. -     glucose blood (ACCU-CHEK AVIVA PLUS) test strip; 1 each by Other route 3 (three) times daily.   There are no diagnoses linked to this encounter.  No orders of the defined types were placed in this encounter.   Follow-up: Return in about 4 weeks (around 06/07/2017) for CBG review.   Boykin Nearing MD

## 2017-05-10 NOTE — Assessment & Plan Note (Signed)
Improved A1c labiles CBGs with hypoglycemia Plan: lantus ordered  novolog 70/30 DCd to avoid hypoglycemia Close f/u with meds, meter and log book

## 2017-05-10 NOTE — Patient Instructions (Addendum)
Eladio was seen today for diabetes.  Diagnoses and all orders for this visit:  Type 2 diabetes mellitus with hyperglycemia, with long-term current use of insulin (HCC) -     POCT glucose (manual entry) -     POCT glycosylated hemoglobin (Hb A1C) -     Insulin Glargine (LANTUS SOLOSTAR) 100 UNIT/ML Solostar Pen; Inject 10 Units into the skin daily at 10 pm. -     Insulin Pen Needle (B-D ULTRAFINE III SHORT PEN) 31G X 8 MM MISC; 1 application by Does not apply route daily. -     glucose blood (ACCU-CHEK AVIVA PLUS) test strip; 1 each by Other route 3 (three) times daily.   Please check and write down blood sugar levels Bring meter and medicine to each appointment   Diabetes blood sugar goals  Fasting (in AM before breakfast, 8 hrs of no eating or drinking (except water or unsweetened coffee or tea): 90-110 2 hrs after meals: < 160,   No low sugars: nothing < 70    F/u in 4 weeks for CBG review with pharmacist   F/u in 8 weeks for diabetes with provider  Dr. Adrian Blackwater

## 2017-05-14 ENCOUNTER — Telehealth: Payer: Self-pay | Admitting: Family Medicine

## 2017-05-14 DIAGNOSIS — E1122 Type 2 diabetes mellitus with diabetic chronic kidney disease: Secondary | ICD-10-CM | POA: Diagnosis not present

## 2017-05-14 DIAGNOSIS — E1143 Type 2 diabetes mellitus with diabetic autonomic (poly)neuropathy: Secondary | ICD-10-CM | POA: Diagnosis not present

## 2017-05-14 DIAGNOSIS — N183 Chronic kidney disease, stage 3 (moderate): Secondary | ICD-10-CM | POA: Diagnosis not present

## 2017-05-14 DIAGNOSIS — I129 Hypertensive chronic kidney disease with stage 1 through stage 4 chronic kidney disease, or unspecified chronic kidney disease: Secondary | ICD-10-CM | POA: Diagnosis not present

## 2017-05-14 DIAGNOSIS — L97221 Non-pressure chronic ulcer of left calf limited to breakdown of skin: Secondary | ICD-10-CM | POA: Diagnosis not present

## 2017-05-14 DIAGNOSIS — E43 Unspecified severe protein-calorie malnutrition: Secondary | ICD-10-CM | POA: Diagnosis not present

## 2017-05-14 NOTE — Telephone Encounter (Signed)
[facility] called to request an order for Pt, she want  To see him 1x per week for 8 weeks, to monitor Diabetics and med management Please call [phone] please follow up

## 2017-05-14 NOTE — Telephone Encounter (Signed)
Please call to [facility] and give verbal order for weekly visit for 8 weeks  Per Dr. Adrian Blackwater

## 2017-05-14 NOTE — Telephone Encounter (Signed)
Will route to PCP 

## 2017-05-15 ENCOUNTER — Ambulatory Visit: Payer: Self-pay

## 2017-05-15 NOTE — Telephone Encounter (Signed)
Brian Owen was called and informed that she is able to do the home visit.

## 2017-05-17 ENCOUNTER — Other Ambulatory Visit: Payer: Self-pay

## 2017-05-17 NOTE — Patient Outreach (Addendum)
Brian Owen [facility]) Care Management  05/17/17  [name] 1951-03-23 [id number]  Attempted to reach patient and his daughter, Domenick Quebedeaux without success. Left HIPAA compliant voicemail with RNCM contact information and invited callback.  [place] R. Azam Gervasi, RN, BSN, [place] Management Coordinator [phone]

## 2017-05-31 ENCOUNTER — Other Ambulatory Visit: Payer: Self-pay

## 2017-05-31 NOTE — Patient Outreach (Signed)
[facility]) Care Management  05/31/17  Alma Braley [birth year] [id number]  Call placed to [facility] line [phone]), spoke with Paul, [place] ID # [phone] and had him place call to patient. Successful outreach completed with patient. Patient identification verified. Patient stated that he has moved in with his granddaughter because the people that own the home they were renting put in on sale. His new address is [address] [place], [place] [address]. Patient stated that he is taking insulin now once a day. He stated that he is taking 10 units. RNCM asked what kind of insulin he was taking and he asked his granddaughter to look, who verified that he has lantus insulin. He stated that this is better for him than twice a day and he feels better. He stated that his daughter or son is checking his blood sugar for him 3 times a day. He denies any lows below 100 and stated that his ranges between 100 and 200. He stated he has not felt bad at all like he did when he had lows before. He stated that he has an appointment in September with his PCP to follow up. Patient stated that he does not want any home visits due to the apartment he is living in being so crowded. He stated that he does need his diabetic pills he was taking because he ran out. RNCM attempted to assess with patient what he is missing but he was not sure what it was. He handed the phone to his granddaughter, Alma Friendly to discuss his medications. Per Alma Friendly, it was a small pill that he used to take. He thought it was for his diabetes but she does not know. Reviewed current medications with patient. Verified he is taking aspirin and lantus only at present. He is out of midrodrine and RNCM advised [facility]. RNCM to call in refill and pharmacy will let them know when it is ready. Alma Friendly also stated that he was out of Cipro. RNCM educated that this is an antibiotic and if he  took all the doses, he has completed it and does not need a refill. She verbalized understanding. Per Alma Friendly, patient moved in with her about a week ago. She stated that she does not think [facility] is aware of the move, but they had not seen him in a while. RNCM educated patient that they should call [facility] and let them know of new address. They were agreeable with RNCM calling next week to update them of patient's move. Alma Friendly stated that they currently have no other case management needs at present. Patient is currently taking his insulin as prescribed and someone is checking his blood sugar at least 3 times a day (per family's report). Patient has declined home visits. RNCM advised that home health would continue to follow patient and RNCM will close case once call placed [facility]. Provided contact information and encouraged them to call if they need additional support or assistance or for any changes. Alma Friendly verbalized understanding and was agreeable. [place] R. Caidyn Henricksen, RN, BSN, [place] Management Coordinator [phone]

## 2017-05-31 NOTE — Patient Outreach (Signed)
[facility]) Care Management  05/31/17  Brian Owen  [birth year] [id number]   Successful outreach completed with [facility] pharmacy to verify if patient has a refill for Midrodrine at that location (because he has [facility]). RNCM spoke with pharmacist there who verified patient has this medication on file with 2 refills remaining. RNCM requested refill and it will be filled this afternoon.  RNCM attempted to call patient again without success. Voicemail box has not been set up and RNCM was unable to leave a message to request callback.  [place] R. Yosgart Pavey, RN, BSN, [place] Management Coordinator [phone]

## 2017-06-06 ENCOUNTER — Other Ambulatory Visit: Payer: Self-pay

## 2017-06-06 NOTE — Patient Outreach (Signed)
Brian Owen) Care Management  06/06/17  Brian Owen 1951-03-08 [id number]  Successful outreach completed with [facility]. RNCM spoke with Merry Proud who confirmed that patient is no longer receiving home health services. He stated that patient was closed at the end of July.  [place] R. Shealynn Saulnier, RN, BSN, [place] Management Coordinator [phone]

## 2017-07-05 ENCOUNTER — Ambulatory Visit: Payer: Medicare Other | Attending: Family Medicine | Admitting: Family Medicine

## 2017-07-05 ENCOUNTER — Encounter: Payer: Self-pay | Admitting: Family Medicine

## 2017-07-05 VITALS — BP 101/69 | HR 95 | Temp 97.7°F | Resp 18 | Ht 67.0 in | Wt 118.4 lb

## 2017-07-05 DIAGNOSIS — H539 Unspecified visual disturbance: Secondary | ICD-10-CM | POA: Diagnosis not present

## 2017-07-05 DIAGNOSIS — X58XXXA Exposure to other specified factors, initial encounter: Secondary | ICD-10-CM | POA: Insufficient documentation

## 2017-07-05 DIAGNOSIS — S90112A Contusion of left great toe without damage to nail, initial encounter: Secondary | ICD-10-CM | POA: Insufficient documentation

## 2017-07-05 DIAGNOSIS — E1165 Type 2 diabetes mellitus with hyperglycemia: Secondary | ICD-10-CM | POA: Insufficient documentation

## 2017-07-05 DIAGNOSIS — Z79899 Other long term (current) drug therapy: Secondary | ICD-10-CM | POA: Diagnosis not present

## 2017-07-05 DIAGNOSIS — Z7982 Long term (current) use of aspirin: Secondary | ICD-10-CM | POA: Diagnosis not present

## 2017-07-05 DIAGNOSIS — Z23 Encounter for immunization: Secondary | ICD-10-CM | POA: Insufficient documentation

## 2017-07-05 DIAGNOSIS — Z794 Long term (current) use of insulin: Secondary | ICD-10-CM | POA: Diagnosis not present

## 2017-07-05 LAB — GLUCOSE, POCT (MANUAL RESULT ENTRY): POC GLUCOSE: 272 mg/dL — AB (ref 70–99)

## 2017-07-05 MED ORDER — GLIPIZIDE 5 MG PO TABS: 2.5000 mg | ORAL_TABLET | Freq: Every day | ORAL | 2 refills | Status: DC

## 2017-07-05 MED ORDER — PNEUMOCOCCAL 13-VAL CONJ VACC IM SUSP: 0.5000 mL | INTRAMUSCULAR | Status: DC

## 2017-07-05 NOTE — Progress Notes (Signed)
Subjective:  Patient ID: Brian Owen, male    DOB: [birth year]  Age: 66 y.o. MRN: [id number]  CC: Diabetes   HPI  Lyrick Ponciano presents for diabetes follow up. He is accompanied by interpreter who interprets for him. Symptoms: hyperglycemia and visual disturbances.  Patient denies foot ulcerations, nausea, polydipsia, polyuria, vomitting and weight loss.  Evaluation to date has been included: fasting blood sugar, hemoglobin A1C and microalbuminuria.  Home sugars: BGs range between 150's and 300's. Treatment to date: insulin. he reports being inconsistent with insulin use. Reports not liking to self administer insulin injections. He has been without his insulin for 3 days.    Outpatient Medications Prior to Visit  Medication Sig Dispense Refill  . ACCU-CHEK SOFTCLIX LANCETS lancets 1 each by Other route 3 (three) times daily. 100 each 12  . Alcohol Swabs PADS 1 each by Does not apply route 3 (three) times daily. 100 each 11  . aspirin EC 81 MG tablet Take 81 mg by mouth daily.    . Blood Glucose Monitoring Suppl (ACCU-CHEK AVIVA PLUS) w/Device KIT 1 Device by Does not apply route 3 (three) times daily after meals. 1 kit 0  . glucose blood (ACCU-CHEK AVIVA PLUS) test strip 1 each by Other route 3 (three) times daily. 100 each 12  . midodrine (PROAMATINE) 5 MG tablet Take 5 mg by mouth 2 (two) times daily with a meal.    . ciprofloxacin (CIPRO) 500 MG tablet Take 1 tablet (500 mg total) by mouth 2 (two) times daily. (Patient not taking: Reported on 05/31/2017) 20 tablet 0  . Insulin Glargine (LANTUS SOLOSTAR) 100 UNIT/ML Solostar Pen Inject 10 Units into the skin daily at 10 pm. 5 pen 2  . Insulin Pen Needle (B-D ULTRAFINE III SHORT PEN) 31G X 8 MM MISC 1 application by Does not apply route daily. 100 each 3   No facility-administered medications prior to visit.     ROS Review of Systems  Constitutional: Negative.   Eyes: Negative.   Respiratory: Negative.   Cardiovascular: Negative.     Gastrointestinal: Negative.   Genitourinary: Negative.   Skin: Negative.   Neurological: Negative.    Objective:  BP 101/69 (BP Location: Left Arm, Patient Position: Sitting, Cuff Size: Normal)   Pulse 95   Temp 97.7 F (36.5 C) (Oral)   Resp 18   Ht '5\' 7"'  (1.702 m)   Wt 118 lb 6.4 oz (53.7 kg)   SpO2 95%   BMI 18.54 kg/m   BP/Weight 07/05/2017 4/58/0998 12/29/8248  Systolic BP 539 767 341  Diastolic BP 69 85 91  Wt. (Lbs) 118.4 126.4 -  BMI 18.54 21.7 -   Physical Exam  Constitutional: He appears well-developed and well-nourished.  Eyes: Pupils are equal, round, and reactive to light. Conjunctivae are normal.  Neck: No JVD present.  Cardiovascular: Normal rate, regular rhythm, normal heart sounds and intact distal pulses.   Pulmonary/Chest: Effort normal and breath sounds normal.  Abdominal: Soft. Bowel sounds are normal. There is no tenderness.  Skin: Skin is warm and dry.  Discoloration to the left great toe, no tenderness or drainage present.  Nursing note and vitals reviewed.   Assessment & Plan:   Problem List Items Addressed This Visit      Endocrine   Type 2 diabetes mellitus with hyperglycemia, with long-term current use of insulin (HCC) - Primary (Chronic)   Insulin discontinued due to nonadherence. Glipizide added.   Continue to take CBG 3 times a day)  glucometer or blood glucose log to next office visit.    Follow-up in 2 weeks.    Relevant Medications   glipiZIDE (GLUCOTROL) 5 MG tablet   Other Relevant Orders   Glucose (CBG) (Completed)   Ambulatory referral to Ophthalmology   Ambulatory referral to Podiatry    Other Visit Diagnoses    Contusion of left great toe without damage to nail, initial encounter       Relevant Orders   Ambulatory referral to Podiatry   Need for vaccination with 13-polyvalent pneumococcal conjugate vaccine       Relevant Orders   Pneumococcal conjugate vaccine 13-valent (Completed)      Meds ordered this encounter   Medications  . glipiZIDE (GLUCOTROL) 5 MG tablet    Sig: Take 0.5 tablets (2.5 mg total) by mouth daily before breakfast.    Dispense:  30 tablet    Refill:  2    Script in [place] please.    Order Specific Question:   Supervising Provider    Answer:   Tresa Garter [id number]  . DISCONTD: pneumococcal 13-valent conjugate vaccine (PREVNAR 13) injection 0.5 mL    Follow-up: Return in about 2 weeks (around 07/19/2017).   Alfonse Spruce FNP

## 2017-07-31 ENCOUNTER — Ambulatory Visit: Payer: Medicare Other | Attending: Family Medicine | Admitting: Family Medicine

## 2017-07-31 ENCOUNTER — Encounter: Payer: Self-pay | Admitting: Family Medicine

## 2017-07-31 VITALS — BP 113/74 | HR 84 | Temp 97.5°F | Resp 18 | Ht 67.0 in | Wt 118.6 lb

## 2017-07-31 DIAGNOSIS — H539 Unspecified visual disturbance: Secondary | ICD-10-CM | POA: Diagnosis not present

## 2017-07-31 DIAGNOSIS — I951 Orthostatic hypotension: Secondary | ICD-10-CM | POA: Insufficient documentation

## 2017-07-31 DIAGNOSIS — Z794 Long term (current) use of insulin: Secondary | ICD-10-CM | POA: Insufficient documentation

## 2017-07-31 DIAGNOSIS — Z79899 Other long term (current) drug therapy: Secondary | ICD-10-CM | POA: Insufficient documentation

## 2017-07-31 DIAGNOSIS — Z7982 Long term (current) use of aspirin: Secondary | ICD-10-CM | POA: Insufficient documentation

## 2017-07-31 DIAGNOSIS — E1165 Type 2 diabetes mellitus with hyperglycemia: Secondary | ICD-10-CM | POA: Diagnosis not present

## 2017-07-31 DIAGNOSIS — R42 Dizziness and giddiness: Secondary | ICD-10-CM | POA: Insufficient documentation

## 2017-07-31 DIAGNOSIS — Z23 Encounter for immunization: Secondary | ICD-10-CM | POA: Diagnosis not present

## 2017-07-31 LAB — GLUCOSE, POCT (MANUAL RESULT ENTRY): POC GLUCOSE: 284 mg/dL — AB (ref 70–99)

## 2017-07-31 LAB — POCT GLYCOSYLATED HEMOGLOBIN (HGB A1C): Hemoglobin A1C: 11.7

## 2017-07-31 MED ORDER — METFORMIN HCL ER 500 MG PO TB24: 1000.0000 mg | ORAL_TABLET | Freq: Two times a day (BID) | ORAL | 3 refills | Status: DC

## 2017-07-31 MED ORDER — GLIPIZIDE 5 MG PO TABS: 2.5000 mg | ORAL_TABLET | Freq: Every day | ORAL | 2 refills | Status: DC

## 2017-07-31 MED ORDER — MIDODRINE HCL 10 MG PO TABS: 10.0000 mg | ORAL_TABLET | Freq: Two times a day (BID) | ORAL | 2 refills | Status: DC

## 2017-07-31 NOTE — Progress Notes (Signed)
Patient is here for f/up   Patient been without DM med for a week

## 2017-08-01 LAB — BASIC METABOLIC PANEL
BUN/Creatinine Ratio: 19 (ref 10–24)
BUN: 37 mg/dL — ABNORMAL HIGH (ref 8–27)
CALCIUM: 9.6 mg/dL (ref 8.6–10.2)
CO2: 22 mmol/L (ref 20–29)
Chloride: 97 mmol/L (ref 96–106)
Creatinine, Ser: 1.95 mg/dL — ABNORMAL HIGH (ref 0.76–1.27)
GFR, EST AFRICAN AMERICAN: 40 mL/min/{1.73_m2} — AB (ref >59)
GFR, EST NON AFRICAN AMERICAN: 35 mL/min/{1.73_m2} — AB (ref >59)
Glucose: 298 mg/dL — ABNORMAL HIGH (ref 65–99)
POTASSIUM: 5 mmol/L (ref 3.5–5.2)
Sodium: 133 mmol/L — ABNORMAL LOW (ref 134–144)

## 2017-08-04 NOTE — Progress Notes (Signed)
Subjective:  Patient ID: Brian Owen, male    DOB: [birth year]  Age: 66 y.o. MRN: [id number]  CC: Diabetes   HPI  Brian Owen presents for diabetes follow up. He is accompanied by his daughter who interprets him. Symptoms: hyperglycemia and visual disturbances.  Patient denies foot ulcerations, nausea, polydipsia, polyuria, vomitting and weight loss.  Evaluation to date has been included: fasting blood sugar, hemoglobin A1C and microalbuminuria.  Home sugars: BGs range between 150's and 300's. Treatment to date: metformin. He is not adherent to carb modified diet. History of dizziness. The patient describes the symptoms as lightheadedness and near syncope. Symptoms are exacerbated by rising from supine position.  Patient denies otalgia, tinnitus, muscle weakness, or vision changes.  He has been treated with midodrine with fair improvement.    Outpatient Medications Prior to Visit  Medication Sig Dispense Refill  . ACCU-CHEK SOFTCLIX LANCETS lancets 1 each by Other route 3 (three) times daily. 100 each 12  . Alcohol Swabs PADS 1 each by Does not apply route 3 (three) times daily. 100 each 11  . aspirin EC 81 MG tablet Take 81 mg by mouth daily.    . Blood Glucose Monitoring Suppl (ACCU-CHEK AVIVA PLUS) w/Device KIT 1 Device by Does not apply route 3 (three) times daily after meals. 1 kit 0  . glucose blood (ACCU-CHEK AVIVA PLUS) test strip 1 each by Other route 3 (three) times daily. 100 each 12  . glipiZIDE (GLUCOTROL) 5 MG tablet Take 0.5 tablets (2.5 mg total) by mouth daily before breakfast. 30 tablet 2  . midodrine (PROAMATINE) 5 MG tablet Take 5 mg by mouth 2 (two) times daily with a meal.     No facility-administered medications prior to visit.     ROS Review of Systems  Constitutional: Negative.   Eyes: Negative.   Respiratory: Negative.   Cardiovascular: Negative.   Gastrointestinal: Negative.   Genitourinary: Negative.   Skin: Negative.   Neurological: Negative.    Objective:   BP 113/74 (BP Location: Left Arm, Patient Position: Sitting, Cuff Size: Normal)   Pulse 84   Temp (!) 97.5 F (36.4 C) (Oral)   Resp 18   Ht 5' 7" (1.702 m)   Wt 118 lb 9.6 oz (53.8 kg)   SpO2 99%   BMI 18.58 kg/m   BP/Weight 07/31/2017 07/05/2017 2/80/0349  Systolic BP 179 150 569  Diastolic BP 74 69 85  Wt. (Lbs) 118.6 118.4 126.4  BMI 18.58 18.54 21.7   Physical Exam  Constitutional: He appears well-developed and well-nourished.  Eyes: Pupils are equal, round, and reactive to light. Conjunctivae are normal.  Neck: No JVD present.  Cardiovascular: Normal rate, regular rhythm, normal heart sounds and intact distal pulses.   Pulmonary/Chest: Effort normal and breath sounds normal.  Abdominal: Soft. Bowel sounds are normal. There is no tenderness.  Skin: Skin is warm and dry.  Nursing note and vitals reviewed.   Assessment & Plan:   1. Type 2 diabetes mellitus with hyperglycemia, with long-term current use of insulin (HCC)  - Glucose (CBG) - HgB A1c - metFORMIN (GLUCOPHAGE-XR) 500 MG 24 hr tablet; Take 2 tablets (1,000 mg total) by mouth 2 (two) times daily with a meal.  Dispense: 180 tablet; Refill: 3 - glipiZIDE (GLUCOTROL) 5 MG tablet; Take 0.5 tablets (2.5 mg total) by mouth daily with breakfast.  Dispense: 30 tablet; Refill: 2  2. Needs flu shot  - Flu Vaccine QUAD 6+ mos PF IM (Fluarix Quad PF)  3.  Need for 23-polyvalent pneumococcal polysaccharide vaccine  - Pneumococcal polysaccharide vaccine 23-valent greater than or equal to 2yo subcutaneous/IM  4. Orthostatic hypotension Orthostatic VS taken. Midodrine increased.  - midodrine (PROAMATINE) 10 MG tablet; Take 1 tablet (10 mg total) by mouth 2 (two) times daily with a meal.  Dispense: 60 tablet; Refill: 2 - Basic metabolic panel   Meds ordered this encounter  Medications  . metFORMIN (GLUCOPHAGE-XR) 500 MG 24 hr tablet    Sig: Take 2 tablets (1,000 mg total) by mouth 2 (two) times daily with a meal.     Dispense:  180 tablet    Refill:  3    Script in [place] please.    Order Specific Question:   Supervising Provider    Answer:   Tresa Garter [id number]  . glipiZIDE (GLUCOTROL) 5 MG tablet    Sig: Take 0.5 tablets (2.5 mg total) by mouth daily with breakfast.    Dispense:  30 tablet    Refill:  2    Script in [place] please.    Order Specific Question:   Supervising Provider    Answer:   Tresa Garter [id number]  . midodrine (PROAMATINE) 10 MG tablet    Sig: Take 1 tablet (10 mg total) by mouth 2 (two) times daily with a meal.    Dispense:  60 tablet    Refill:  2    Order Specific Question:   Supervising Provider    Answer:   Tresa Garter [[id number]]    Follow-up: Return in about 2 weeks (around 08/14/2017) for DM check with [facility] .   Alfonse Spruce FNP

## 2017-08-05 ENCOUNTER — Other Ambulatory Visit: Payer: Self-pay | Admitting: Family Medicine

## 2017-08-06 ENCOUNTER — Ambulatory Visit (INDEPENDENT_AMBULATORY_CARE_PROVIDER_SITE_OTHER): Payer: Medicare Other | Admitting: Podiatry

## 2017-08-06 ENCOUNTER — Encounter: Payer: Self-pay | Admitting: Podiatry

## 2017-08-06 VITALS — BP 177/96 | HR 87

## 2017-08-06 DIAGNOSIS — B351 Tinea unguium: Secondary | ICD-10-CM | POA: Diagnosis not present

## 2017-08-06 DIAGNOSIS — E1151 Type 2 diabetes mellitus with diabetic peripheral angiopathy without gangrene: Secondary | ICD-10-CM | POA: Diagnosis not present

## 2017-08-06 DIAGNOSIS — R0989 Other specified symptoms and signs involving the circulatory and respiratory systems: Secondary | ICD-10-CM | POA: Diagnosis not present

## 2017-08-06 NOTE — Progress Notes (Signed)
   Subjective:    Patient ID: Brian Owen, male    DOB: 12/03/1950, 66 y.o.   MRN: [id number]  HPI This patient presents today with interpreter and daughter present in the treatment room. The patient is from [place] and does not speak or understand [place]. Patient is diabetic with a history of lower extremity wounds Patient is a former smoker Patient denies history of claudication   Review of Systems  All other systems reviewed and are negative.      Objective:   Physical Exam  Patient appears orientated 3 and does respond to questioning with his interpreter  Vascular: No calf edema or calf tenderness bilaterally DP right 2/4 and 0/4 left PT pulse 1/4 right and 0/4 left Capillary reflex delayed right and within normal limits left  Neurological: Sensation to 10 g monofilament wire intact 8/8 bilaterally Vibratory sensation reactive bilaterally Ankle reflexes reactive bilaterally  Dermatological: No open skin lesions bilaterally Atrophic skin with absent hair growth bilaterally Elongated, discolored toenails 6-10  Musculoskeletal: There is no restriction ankle, subtalar, midtarsal joints bilaterally Manual motor testing dorsi flexion, plantar flexion 5/5 bilaterally       Assessment & Plan:   Assessment: Diabetic with absent pedal pulses suggestive peripheral arterial disease Mycotic toenails 6-10  Plan: Patient referred to vascular lab for lower extremity arterial Doppler for the indication of diabetic with absent pedal pulses Debridement toenails 6-10 mechanically an electrical without a bleeding  Notify patient upon receipt of arterial Doppler Reappoint 3 months for nail debridement

## 2017-08-06 NOTE — Addendum Note (Signed)
Addended by: Harriett Sine D on: 08/06/2017 04:07 PM   Modules accepted: Orders

## 2017-08-06 NOTE — Patient Instructions (Addendum)
Today your diabetic foot exam demonstrated absent pedal pulses in your feet. We are requesting a vascular exam to evaluate his circulation in legs and feet in further detail The vascular lab we'll contact you to schedule visit Recommended return every several months for debridement of the thickened toenails   Diabetes and Foot Care Diabetes may cause you to have problems because of poor blood supply (circulation) to your feet and legs. This may cause the skin on your feet to become thinner, break easier, and heal more slowly. Your skin may become dry, and the skin may peel and crack. You may also have nerve damage in your legs and feet causing decreased feeling in them. You may not notice minor injuries to your feet that could lead to infections or more serious problems. Taking care of your feet is one of the most important things you can do for yourself. Follow these instructions at home:  Wear shoes at all times, even in the house. Do not go barefoot. Bare feet are easily injured.  Check your feet daily for blisters, cuts, and redness. If you cannot see the bottom of your feet, use a mirror or ask someone for help.  Wash your feet with warm water (do not use hot water) and mild soap. Then pat your feet and the areas between your toes until they are completely dry. Do not soak your feet as this can dry your skin.  Apply a moisturizing lotion or petroleum jelly (that does not contain alcohol and is unscented) to the skin on your feet and to dry, brittle toenails. Do not apply lotion between your toes.  Trim your toenails straight across. Do not dig under them or around the cuticle. File the edges of your nails with an emery board or nail file.  Do not cut corns or calluses or try to remove them with medicine.  Wear clean socks or stockings every day. Make sure they are not too tight. Do not wear knee-high stockings since they may decrease blood flow to your legs.  Wear shoes that fit  properly and have enough cushioning. To break in new shoes, wear them for just a few hours a day. This prevents you from injuring your feet. Always look in your shoes before you put them on to be sure there are no objects inside.  Do not cross your legs. This may decrease the blood flow to your feet.  If you find a minor scrape, cut, or break in the skin on your feet, keep it and the skin around it clean and dry. These areas may be cleansed with mild soap and water. Do not cleanse the area with peroxide, alcohol, or iodine.  When you remove an adhesive bandage, be sure not to damage the skin around it.  If you have a wound, look at it several times a day to make sure it is healing.  Do not use heating pads or hot water bottles. They may burn your skin. If you have lost feeling in your feet or legs, you may not know it is happening until it is too late.  Make sure your health care provider performs a complete foot exam at least annually or more often if you have foot problems. Report any cuts, sores, or bruises to your health care provider immediately. Contact a health care provider if:  You have an injury that is not healing.  You have cuts or breaks in the skin.  You have an ingrown nail.  You notice redness on your legs or feet.  You feel burning or tingling in your legs or feet.  You have pain or cramps in your legs and feet.  Your legs or feet are numb.  Your feet always feel cold. Get help right away if:  There is increasing redness, swelling, or pain in or around a wound.  There is a red line that goes up your leg.  Pus is coming from a wound.  You develop a fever or as directed by your health care provider.  You notice a bad smell coming from an ulcer or wound. This information is not intended to replace advice given to you by your health care provider. Make sure you discuss any questions you have with your health care provider. Document Released: 10/12/2000 Document  Revised: 03/22/2016 Document Reviewed: 03/24/2013 Elsevier Interactive Patient Education  2017 [employer].

## 2017-08-08 ENCOUNTER — Telehealth: Payer: Self-pay

## 2017-08-08 NOTE — Telephone Encounter (Signed)
-----   Message from Alfonse Spruce, [place] sent at 08/05/2017  6:09 PM EDT ----- Kidney function has worsened.  Levels indicate you have chronic kidney disease stage 3. Some causes include uncontrolled diabetes overtime and advanced age. -Continue to take your diabetics medications every day. Avoid taking NSAID medications, reduce salt intake to 2 to 4 grams/day, do not smoke. Recommend scheduling labs appointment for recheck in 6 weeks. If kidney function worsens or does not improve you will be referred to a nephrologist (kidney specialist).

## 2017-08-08 NOTE — Telephone Encounter (Signed)
interpreter  name & ID Eddie Dibbles [id number]  [place] call regarding lab results   Patient  Daughter answer & verify DOB   Patient daughter was aware and understood

## 2017-08-14 ENCOUNTER — Ambulatory Visit: Payer: Medicare Other | Attending: Family Medicine | Admitting: Pharmacist

## 2017-08-14 DIAGNOSIS — Z794 Long term (current) use of insulin: Secondary | ICD-10-CM | POA: Diagnosis not present

## 2017-08-14 DIAGNOSIS — E1165 Type 2 diabetes mellitus with hyperglycemia: Secondary | ICD-10-CM

## 2017-08-14 DIAGNOSIS — Z0489 Encounter for examination and observation for other specified reasons: Secondary | ICD-10-CM | POA: Insufficient documentation

## 2017-08-14 DIAGNOSIS — Z7984 Long term (current) use of oral hypoglycemic drugs: Secondary | ICD-10-CM | POA: Insufficient documentation

## 2017-08-14 LAB — GLUCOSE, POCT (MANUAL RESULT ENTRY): POC GLUCOSE: 131 mg/dL — AB (ref 70–99)

## 2017-08-14 MED ORDER — GLIPIZIDE 5 MG PO TABS: 5.0000 mg | ORAL_TABLET | Freq: Every day | ORAL | 2 refills | Status: DC

## 2017-08-14 MED ORDER — METFORMIN HCL ER 500 MG PO TB24: 500.0000 mg | ORAL_TABLET | Freq: Two times a day (BID) | ORAL | 2 refills | Status: DC

## 2017-08-14 NOTE — Patient Instructions (Addendum)
Thanks for coming to see [place]  Continue the glipizide  Get hydrocortisone cream for your back.  Schedule an appointment with our dermatology clinic  Come back in 3 weeks with your blood sugar meter

## 2017-08-14 NOTE — Progress Notes (Signed)
    S:     Chief Complaint  Patient presents with  . Medication Management    Patient arrives in good spirits with his wife and daughter.  Presents for diabetes evaluation, education, and management at the request of Fredia Beets, NP. Patient was referred on 07/31/17.  Patient was last seen by Primary Care Provider on 07/31/17. A live interpreter was present for the entirety of the visit.  Patient denies adherence with medications. He does not take the metformin and he is actually taking a full tablet of the glipizide.  Current diabetes medications metformin XL 1000 mg BID, glipizide 2.5 mg daily   Patient denies hypoglycemic events. However, he reports that he has been dizzy and he is not sure if that is due to his blood pressure or his blood sugar. Patient does not check blood sugar at home but does have a meter.  Patient reported dietary habits: was told to stop drinking coffee recently and would like to drink it again.  Patient reports a rash on his back that is really bothering him. It is very itchy but does not hurt. He denies using any new soaps or detergents. He is unsure of the cause but it comes and goes. O:  Physical Exam   ROS   Lab Results  Component Value Date   HGBA1C 11.7 07/31/2017   There were no vitals filed for this visit.  Home fasting CBG: does not check 2 hour post-prandial/random CBG: does not check  POCT glucose = 131  A/P: Diabetes longstanding currently uncontrolled based on A1c of 11.7. Patient denies hypoglycemic events and is able to verbalize appropriate hypoglycemia management plan. Patient denies adherence with medication. Control is suboptimal due to nonadherence to medications and dietary indiscretion.  Patient has not been taking metformin and since his GFR is 35, will hold off on restarting it. Patient is taking glipizide 5 mg and not the 2.5 as directed but blood sugars seem to be ok so will continue that. Patient to start checking  blood sugars at home at least once a day as I need these to be able to know if he needs any changes to his medications.   I am not sure who told him not to have coffee but I told him he would have to follow up with his PCP to discuss but to not drink coffee for now.  Rash appears to be from dry skin or irritant. Also had Carilyn Goodpasture, RN, to evaluated. Recommended hydrocortisone cream for rash (does not appear fungal in nature) and to make an appointment with Fredia Beets for further evaluation. He can also make an appointment with the dermatologist if he prefers.  Next A1C anticipated January 2019.    Written patient instructions provided. Total time in face to face counseling 30 minutes.   Follow up in Pharmacist Clinic Visit in 3 weeks.   Patient seen with Thornton Park, PharmD Candidate

## 2017-08-23 ENCOUNTER — Ambulatory Visit (HOSPITAL_COMMUNITY)
Admission: RE | Admit: 2017-08-23 | Discharge: 2017-08-23 | Disposition: A | Discharge status: Home / Self Care | Payer: Medicare Other | Source: Ambulatory Visit | Attending: Cardiology | Admitting: Cardiology

## 2017-08-23 DIAGNOSIS — R0989 Other specified symptoms and signs involving the circulatory and respiratory systems: Secondary | ICD-10-CM | POA: Diagnosis not present

## 2017-08-23 DIAGNOSIS — E1151 Type 2 diabetes mellitus with diabetic peripheral angiopathy without gangrene: Secondary | ICD-10-CM | POA: Insufficient documentation

## 2017-08-27 ENCOUNTER — Telehealth: Payer: Self-pay | Admitting: *Deleted

## 2017-08-27 NOTE — Telephone Encounter (Signed)
-----   Message from Gean Birchwood, DPM sent at 08/26/2017  7:37 AM EDT -----   Schedule vascular consult for abnormal arterial Doppler Gean Birchwood, DPM 08/26/2017   Final Interpretation: Right: Resting right ankle-brachial index is within normal range. No evidence of significant right lower extremity arterial disease. The right toe-brachial index is normal. Left: ABIs are unreliable. The left toe-brachial index is abnormal. #See table(s) above for measurements and observations.  Suggest follow up PV Consult.

## 2017-08-27 NOTE — Telephone Encounter (Signed)
Pt is scheduled to see Dr. Gwenlyn Found 09/11/2017.

## 2017-09-05 ENCOUNTER — Encounter: Payer: Self-pay | Admitting: Pharmacist

## 2017-09-05 ENCOUNTER — Ambulatory Visit: Payer: Medicare Other | Attending: Internal Medicine | Admitting: Pharmacist

## 2017-09-05 DIAGNOSIS — Z794 Long term (current) use of insulin: Secondary | ICD-10-CM | POA: Diagnosis not present

## 2017-09-05 DIAGNOSIS — E1165 Type 2 diabetes mellitus with hyperglycemia: Secondary | ICD-10-CM

## 2017-09-05 DIAGNOSIS — R739 Hyperglycemia, unspecified: Secondary | ICD-10-CM

## 2017-09-05 DIAGNOSIS — E119 Type 2 diabetes mellitus without complications: Secondary | ICD-10-CM | POA: Diagnosis present

## 2017-09-05 DIAGNOSIS — Z9114 Patient's other noncompliance with medication regimen: Secondary | ICD-10-CM | POA: Insufficient documentation

## 2017-09-05 LAB — POCT URINALYSIS DIPSTICK
Bilirubin, UA: NEGATIVE
GLUCOSE UA: 500
Ketones, UA: NEGATIVE
Leukocytes, UA: NEGATIVE
Nitrite, UA: NEGATIVE
PH UA: 5.5 (ref 5.0–8.0)
SPEC GRAV UA: 1.015 (ref 1.010–1.025)
UROBILINOGEN UA: 0.2 U/dL

## 2017-09-05 LAB — GLUCOSE, POCT (MANUAL RESULT ENTRY)
POC GLUCOSE: 355 mg/dL — AB (ref 70–99)
POC GLUCOSE: 375 mg/dL — AB (ref 70–99)

## 2017-09-05 MED ORDER — INSULIN ASPART 100 UNIT/ML ~~LOC~~ SOLN
10.0000 [IU] | Freq: Once | SUBCUTANEOUS | Status: AC
  Administered 2017-09-05: 10 [IU] via SUBCUTANEOUS

## 2017-09-05 NOTE — Patient Instructions (Addendum)
Thanks for coming to see [place]  Follow up with [facility] in 2 weeks.   Please check your blood sugar at home every day

## 2017-09-05 NOTE — Progress Notes (Signed)
    S:     Chief Complaint  Patient presents with  . Medication Management    Patient arrives in good spirits with his wife and daughter.  Presents for diabetes evaluation, education, and management at the request of Fredia Beets, NP. Patient was referred on 07/31/17.  Patient was last seen by Primary Care Provider on 07/31/17. A live interpreter was present for the entirety of the visit.  Patient denies adherence with medications. He did not take his glipizide yet today. Current diabetes medications: glipizide 5 mg daily   Patient denies hypoglycemic events. He does not check his blood sugar at home though he has been instructed to.  Patient reported dietary habits: no changes. He does not follow dietary advice that has been given. He has told me in the past he was told to not drink coffee by another provider but his family reports that he continues to drink it. He is thirsty and wants juice in clinic today despite telling him multiple times that he cannot drink juice with elevated blood sugar.  Patient's family reports that he doesn't take care of himself and does whatever he wants. Patient disagrees with this.   Physical Exam   ROS   Lab Results  Component Value Date   HGBA1C 11.7 07/31/2017   There were no vitals filed for this visit.  Home fasting CBG: does not check 2 hour post-prandial/random CBG: does not check  POCT glucose = 355, second after insulin was 375  Urinalysis negative for ketones  A/P: Diabetes longstanding currently uncontrolled based on A1c of 11.7. Patient denies hypoglycemic events and is able to verbalize appropriate hypoglycemia management plan. Patient denies adherence with medication. Control is suboptimal due to nonadherence to medications and dietary indiscretion.  Novolog 10 units administered x 1 as patient reported he had not eaten yet today. Blood glucose was further elevated at next reading indicating that he likely did eat today.  Discussed with Dr. Jarold Song, with negative ketones, will send home without further insulin administration.  Patient is noncompliant with medical treatment and advice. There isn't anything I can do to help him at this point.  Follow up with PCP in 2 weeks. Discussed case with Christa See, LCSW, and Eden Lathe, RN Case Manager. Patient would benefit from referral for home health and to see LCSW at next visit. Stressed to patient to check his blood sugars and take his medications as directed but I think that he will not be compliant until psychosocial issues are resolved.  Next A1C anticipated January 2019.    Written patient instructions provided. Total time in face to face counseling 45 minutes.  Patient seen with Thornton Park, PharmD Candidate

## 2017-09-11 ENCOUNTER — Ambulatory Visit: Payer: Medicare Other | Admitting: Cardiovascular Disease

## 2017-09-27 ENCOUNTER — Ambulatory Visit (INDEPENDENT_AMBULATORY_CARE_PROVIDER_SITE_OTHER): Payer: Medicare Other | Admitting: Cardiovascular Disease

## 2017-09-27 ENCOUNTER — Encounter: Payer: Self-pay | Admitting: Cardiovascular Disease

## 2017-09-27 VITALS — BP 126/78 | HR 90 | Ht 67.0 in | Wt 116.0 lb

## 2017-09-27 DIAGNOSIS — I10 Essential (primary) hypertension: Secondary | ICD-10-CM

## 2017-09-27 DIAGNOSIS — R0989 Other specified symptoms and signs involving the circulatory and respiratory systems: Secondary | ICD-10-CM | POA: Insufficient documentation

## 2017-09-27 NOTE — Assessment & Plan Note (Signed)
Brian Owen was referred by Dr. Amalia Hailey because of absent pedal pulses. He does have risk factors including tobacco abuse and diabetes. He denies claudication. There are no open wounds. His Doppler showed normal ABIs. No further workup is necessary at this time.

## 2017-09-27 NOTE — Progress Notes (Signed)
09/27/2017 Brian Owen   02-23-1951  [id number]  Primary Physician Alfonse Spruce, FNP Primary Cardiologist: Lorretta Harp MD Lupe Carney, [place]  HPI:  Brian Owen is a 66 y.o. married [place] male father of 5 children was accompanied by his wife and an interpreter today. He is referred by Dr. Amalia Hailey, his podiatrist, because of absent pedal pulses. He has a history of tobacco abuse treated diabetes. Never had a heart attack or stroke. He denies chest pain or shortness of breath. He denies claudication. There is no evidence of critical limb ischemia/. Actually Dopplers performed 08/17/17 revealed normal ABI ABIs bilaterally although the duplex did suggest an an occluded left SFA.   Current Meds  Medication Sig  . ACCU-CHEK SOFTCLIX LANCETS lancets 1 each by Other route 3 (three) times daily.  Marland Kitchen aspirin EC 81 MG tablet Take 81 mg by mouth daily.  . Blood Glucose Monitoring Suppl (ACCU-CHEK AVIVA PLUS) w/Device KIT 1 Device by Does not apply route 3 (three) times daily after meals.  Marland Kitchen glipiZIDE (GLUCOTROL) 5 MG tablet Take 1 tablet (5 mg total) by mouth daily before breakfast.  . glucose blood (ACCU-CHEK AVIVA PLUS) test strip 1 each by Other route 3 (three) times daily.  . midodrine (PROAMATINE) 10 MG tablet      No Known Allergies  Social History   Socioeconomic History  . Marital status: Married    Spouse name: Not on file  . Number of children: Not on file  . Years of education: Not on file  . Highest education level: Not on file  Social Needs  . Financial resource strain: Not on file  . Food insecurity - worry: Not on file  . Food insecurity - inability: Not on file  . Transportation needs - medical: Not on file  . Transportation needs - non-medical: Not on file  Occupational History  . Not on file  Tobacco Use  . Smoking status: Former Smoker    Packs/day: 1.50    Years: 48.00    Pack years: 72.00    Types: Cigarettes    Last attempt to quit: 2015      Years since quitting: 3.9  . Smokeless tobacco: Never Used  Substance and Sexual Activity  . Alcohol use: No  . Drug use: No  . Sexual activity: No  Other Topics Concern  . Not on file  Social History Narrative   ** Merged History Encounter **         Review of Systems: General: negative for chills, fever, night sweats or weight changes.  Cardiovascular: negative for chest pain, dyspnea on exertion, edema, orthopnea, palpitations, paroxysmal nocturnal dyspnea or shortness of breath Dermatological: negative for rash Respiratory: negative for cough or wheezing Urologic: negative for hematuria Abdominal: negative for nausea, vomiting, diarrhea, bright red blood per rectum, melena, or hematemesis Neurologic: negative for visual changes, syncope, or dizziness All other systems reviewed and are otherwise negative except as noted above.    Blood pressure 126/78, pulse 90, height _0  (1.702 m), weight 116 lb (52.6 kg).  General appearance: alert and no distress Neck: no adenopathy, no carotid bruit, no JVD, supple, symmetrical, trachea midline and thyroid not enlarged, symmetric, no tenderness/mass/nodules Lungs: clear to auscultation bilaterally Heart: regular rate and rhythm, S1, S2 normal, no murmur, click, rub or gallop Extremities: extremities normal, atraumatic, no cyanosis or edema Pulses: Diminished pedal pulses bilaterally Skin: Skin color, texture, turgor normal. No rashes or lesions Neurologic: Alert and oriented  X 3, normal strength and tone. Normal symmetric reflexes. Normal coordination and gait  EKG sinus rhythm at 90 without ST or T-wave changes. I personally reviewed this EKG.  ASSESSMENT AND PLAN:   Absent pedal pulses [name] was referred by Dr. Amalia Hailey because of absent pedal pulses. He does have risk factors including tobacco abuse and diabetes. He denies claudication. There are no open wounds. His Doppler showed normal ABIs. No further workup is necessary  at this time.      Lorretta Harp MD FACP,FACC,FAHA, [facility] 09/27/2017 3:25 PM

## 2017-09-27 NOTE — Patient Instructions (Signed)
Medication Instructions: Your physician recommends that you continue on your current medications as directed. Please refer to the Current Medication list given to you today.   Follow-Up: Your physician recommends that you schedule a follow-up appointment as needed with Dr. Berry.    

## 2017-11-04 ENCOUNTER — Ambulatory Visit: Payer: Medicare Other | Admitting: Sports Medicine

## 2018-01-10 ENCOUNTER — Emergency Department (HOSPITAL_COMMUNITY): Payer: Medicare Other

## 2018-01-10 ENCOUNTER — Observation Stay (HOSPITAL_COMMUNITY)
Admission: EM | Admit: 2018-01-10 | Discharge: 2018-01-12 | Disposition: A | Discharge status: Home / Self Care | Payer: Medicare Other | Attending: Family Medicine | Admitting: Family Medicine

## 2018-01-10 ENCOUNTER — Encounter (HOSPITAL_COMMUNITY): Payer: Self-pay

## 2018-01-10 ENCOUNTER — Other Ambulatory Visit: Payer: Self-pay

## 2018-01-10 DIAGNOSIS — E1122 Type 2 diabetes mellitus with diabetic chronic kidney disease: Secondary | ICD-10-CM | POA: Diagnosis not present

## 2018-01-10 DIAGNOSIS — I129 Hypertensive chronic kidney disease with stage 1 through stage 4 chronic kidney disease, or unspecified chronic kidney disease: Secondary | ICD-10-CM | POA: Diagnosis not present

## 2018-01-10 DIAGNOSIS — A0839 Other viral enteritis: Secondary | ICD-10-CM | POA: Insufficient documentation

## 2018-01-10 DIAGNOSIS — I959 Hypotension, unspecified: Secondary | ICD-10-CM | POA: Diagnosis not present

## 2018-01-10 DIAGNOSIS — Z8619 Personal history of other infectious and parasitic diseases: Secondary | ICD-10-CM | POA: Diagnosis not present

## 2018-01-10 DIAGNOSIS — A0811 Acute gastroenteropathy due to Norwalk agent: Secondary | ICD-10-CM | POA: Diagnosis not present

## 2018-01-10 DIAGNOSIS — R739 Hyperglycemia, unspecified: Secondary | ICD-10-CM

## 2018-01-10 DIAGNOSIS — E43 Unspecified severe protein-calorie malnutrition: Secondary | ICD-10-CM | POA: Diagnosis not present

## 2018-01-10 DIAGNOSIS — I10 Essential (primary) hypertension: Secondary | ICD-10-CM | POA: Diagnosis present

## 2018-01-10 DIAGNOSIS — Z79899 Other long term (current) drug therapy: Secondary | ICD-10-CM | POA: Diagnosis not present

## 2018-01-10 DIAGNOSIS — E1165 Type 2 diabetes mellitus with hyperglycemia: Secondary | ICD-10-CM | POA: Insufficient documentation

## 2018-01-10 DIAGNOSIS — Z87891 Personal history of nicotine dependence: Secondary | ICD-10-CM | POA: Diagnosis not present

## 2018-01-10 DIAGNOSIS — N281 Cyst of kidney, acquired: Secondary | ICD-10-CM | POA: Diagnosis not present

## 2018-01-10 DIAGNOSIS — Z9114 Patient's other noncompliance with medication regimen: Secondary | ICD-10-CM | POA: Insufficient documentation

## 2018-01-10 DIAGNOSIS — R197 Diarrhea, unspecified: Secondary | ICD-10-CM | POA: Diagnosis present

## 2018-01-10 DIAGNOSIS — N183 Chronic kidney disease, stage 3 unspecified: Secondary | ICD-10-CM | POA: Diagnosis present

## 2018-01-10 DIAGNOSIS — N179 Acute kidney failure, unspecified: Principal | ICD-10-CM | POA: Diagnosis present

## 2018-01-10 DIAGNOSIS — E1129 Type 2 diabetes mellitus with other diabetic kidney complication: Secondary | ICD-10-CM | POA: Diagnosis present

## 2018-01-10 LAB — CBC WITH DIFFERENTIAL/PLATELET
BASOS ABS: 0 10*3/uL (ref 0.0–0.1)
Basophils Relative: 0 %
Eosinophils Absolute: 0.2 10*3/uL (ref 0.0–0.7)
Eosinophils Relative: 2 %
HEMATOCRIT: 33.7 % — AB (ref 39.0–52.0)
HEMOGLOBIN: 11.1 g/dL — AB (ref 13.0–17.0)
Lymphocytes Relative: 13 %
Lymphs Abs: 0.9 10*3/uL (ref 0.7–4.0)
MCH: 29.4 pg (ref 26.0–34.0)
MCHC: 32.9 g/dL (ref 30.0–36.0)
MCV: 89.2 fL (ref 78.0–100.0)
Monocytes Absolute: 0.2 10*3/uL (ref 0.1–1.0)
Monocytes Relative: 2 %
NEUTROS ABS: 6.1 10*3/uL (ref 1.7–7.7)
Neutrophils Relative %: 83 %
Platelets: 151 10*3/uL (ref 150–400)
RBC: 3.78 MIL/uL — AB (ref 4.22–5.81)
RDW: 12.7 % (ref 11.5–15.5)
WBC: 7.4 10*3/uL (ref 4.0–10.5)

## 2018-01-10 LAB — COMPREHENSIVE METABOLIC PANEL
ALT: 42 U/L (ref 17–63)
AST: 44 U/L — AB (ref 15–41)
Albumin: 3.6 g/dL (ref 3.5–5.0)
Alkaline Phosphatase: 110 U/L (ref 38–126)
Anion gap: 7 (ref 5–15)
BUN: 28 mg/dL — AB (ref 6–20)
CALCIUM: 8.5 mg/dL — AB (ref 8.9–10.3)
CO2: 23 mmol/L (ref 22–32)
CREATININE: 1.6 mg/dL — AB (ref 0.61–1.24)
Chloride: 106 mmol/L (ref 101–111)
GFR calc non Af Amer: 43 mL/min — ABNORMAL LOW (ref >60)
GFR, EST AFRICAN AMERICAN: 50 mL/min — AB (ref >60)
Glucose, Bld: 367 mg/dL — ABNORMAL HIGH (ref 65–99)
Potassium: 4.4 mmol/L (ref 3.5–5.1)
SODIUM: 136 mmol/L (ref 135–145)
Total Bilirubin: 0.4 mg/dL (ref 0.3–1.2)
Total Protein: 7.4 g/dL (ref 6.5–8.1)

## 2018-01-10 LAB — LIPASE, BLOOD: Lipase: 55 U/L — ABNORMAL HIGH (ref 11–51)

## 2018-01-10 LAB — CBG MONITORING, ED: Glucose-Capillary: 368 mg/dL — ABNORMAL HIGH (ref 65–99)

## 2018-01-10 MED ORDER — ONDANSETRON HCL 4 MG/2ML IJ SOLN
4.0000 mg | Freq: Once | INTRAMUSCULAR | Status: AC
Start: 2018-01-10 — End: 2018-01-10
  Administered 2018-01-10: 4 mg via INTRAVENOUS
  Filled 2018-01-10: qty 2

## 2018-01-10 MED ORDER — IOPAMIDOL (ISOVUE-300) INJECTION 61%
INTRAVENOUS | Status: AC
  Administered 2018-01-10: 30 mL via ORAL
  Filled 2018-01-10: qty 30

## 2018-01-10 MED ORDER — SODIUM CHLORIDE 0.9 % IV BOLUS (SEPSIS)
1000.0000 mL | Freq: Once | INTRAVENOUS | Status: AC
  Administered 2018-01-10: 1000 mL via INTRAVENOUS

## 2018-01-10 MED ORDER — SODIUM CHLORIDE 0.9 % IV SOLN
INTRAVENOUS | Status: DC
  Administered 2018-01-11 – 2018-01-12 (×4): via INTRAVENOUS

## 2018-01-10 NOTE — ED Notes (Signed)
EKG given to EDP,Rees,MD., for review. 

## 2018-01-10 NOTE — ED Triage Notes (Signed)
Patient c/o via EMS from home with family. At 1936-cbg 465 & BP 80/60. EMS gave 500 bolus which brought it up to 148/93 after bolus. Patient has c/o diarrhea since 01/09/18. Per family at home, patient & family just moved 3 weeks ago and hasn't been compliant with medication for 3 weeks. The family states they have no access to healthcare and medication. Patient has no c/o pain. CBG now is 368.

## 2018-01-10 NOTE — ED Notes (Signed)
Patient does speak some [place]. The interpreter line to call is [phone] - choose 6 for all other languages. Access code is [phone].

## 2018-01-10 NOTE — ED Notes (Signed)
Pt. CBG 368. RN,Kirstin made aware.

## 2018-01-10 NOTE — ED Notes (Signed)
Patient primarily speaks official language of [place] which is Neurosurgeon. Our interpreter machine doesn't have this language, therefore, will have to call interpreter line.

## 2018-01-11 ENCOUNTER — Other Ambulatory Visit: Payer: Self-pay

## 2018-01-11 ENCOUNTER — Emergency Department (HOSPITAL_COMMUNITY): Payer: Medicare Other

## 2018-01-11 ENCOUNTER — Encounter (HOSPITAL_COMMUNITY): Payer: Self-pay

## 2018-01-11 DIAGNOSIS — E43 Unspecified severe protein-calorie malnutrition: Secondary | ICD-10-CM

## 2018-01-11 DIAGNOSIS — R197 Diarrhea, unspecified: Secondary | ICD-10-CM | POA: Diagnosis not present

## 2018-01-11 DIAGNOSIS — I959 Hypotension, unspecified: Secondary | ICD-10-CM | POA: Diagnosis not present

## 2018-01-11 DIAGNOSIS — E1129 Type 2 diabetes mellitus with other diabetic kidney complication: Secondary | ICD-10-CM | POA: Diagnosis present

## 2018-01-11 LAB — CLOSTRIDIUM DIFFICILE BY PCR, REFLEXED: CDIFFPCR: NEGATIVE

## 2018-01-11 LAB — GASTROINTESTINAL PANEL BY PCR, STOOL (REPLACES STOOL CULTURE)

## 2018-01-11 LAB — BASIC METABOLIC PANEL
ANION GAP: 4 — AB (ref 5–15)
BUN: 20 mg/dL (ref 6–20)
CHLORIDE: 112 mmol/L — AB (ref 101–111)
CO2: 23 mmol/L (ref 22–32)
Calcium: 8 mg/dL — ABNORMAL LOW (ref 8.9–10.3)
Creatinine, Ser: 1.41 mg/dL — ABNORMAL HIGH (ref 0.61–1.24)
GFR calc Af Amer: 58 mL/min — ABNORMAL LOW (ref >60)
GFR calc non Af Amer: 50 mL/min — ABNORMAL LOW (ref >60)
GLUCOSE: 239 mg/dL — AB (ref 65–99)
POTASSIUM: 4.3 mmol/L (ref 3.5–5.1)
Sodium: 139 mmol/L (ref 135–145)

## 2018-01-11 LAB — CBC
HEMATOCRIT: 33.6 % — AB (ref 39.0–52.0)
HEMOGLOBIN: 10.8 g/dL — AB (ref 13.0–17.0)
MCH: 29.2 pg (ref 26.0–34.0)
MCHC: 32.1 g/dL (ref 30.0–36.0)
MCV: 90.8 fL (ref 78.0–100.0)
Platelets: 156 10*3/uL (ref 150–400)
RBC: 3.7 MIL/uL — ABNORMAL LOW (ref 4.22–5.81)
RDW: 12.9 % (ref 11.5–15.5)
WBC: 4.9 10*3/uL (ref 4.0–10.5)

## 2018-01-11 LAB — GLUCOSE, CAPILLARY
GLUCOSE-CAPILLARY: 132 mg/dL — AB (ref 65–99)
GLUCOSE-CAPILLARY: 122 mg/dL — AB (ref 65–99)
Glucose-Capillary: 205 mg/dL — ABNORMAL HIGH (ref 65–99)
Glucose-Capillary: 213 mg/dL — ABNORMAL HIGH (ref 65–99)
Glucose-Capillary: 108 mg/dL — ABNORMAL HIGH (ref 65–99)

## 2018-01-11 LAB — C DIFFICILE QUICK SCREEN W PCR REFLEX
C DIFFICILE (CDIFF) TOXIN: NEGATIVE
C DIFFICLE (CDIFF) ANTIGEN: POSITIVE — AB

## 2018-01-11 LAB — LACTIC ACID, PLASMA: Lactic Acid, Venous: 0.8 mmol/L (ref 0.5–1.9)

## 2018-01-11 MED ORDER — ACCU-CHEK AVIVA PLUS W/DEVICE KIT: 1.0000 | PACK | Freq: Three times a day (TID) | Status: DC

## 2018-01-11 MED ORDER — HYDRALAZINE HCL 20 MG/ML IJ SOLN: 5.0000 mg | INTRAMUSCULAR | Status: DC | PRN

## 2018-01-11 MED ORDER — IOPAMIDOL (ISOVUE-300) INJECTION 61%
INTRAVENOUS | Status: AC
  Filled 2018-01-11: qty 100

## 2018-01-11 MED ORDER — SODIUM CHLORIDE 0.9 % IV BOLUS (SEPSIS)
1500.0000 mL | Freq: Once | INTRAVENOUS | Status: AC
  Administered 2018-01-11: 1500 mL via INTRAVENOUS

## 2018-01-11 MED ORDER — ENOXAPARIN SODIUM 40 MG/0.4ML ~~LOC~~ SOLN: 40.0000 mg | SUBCUTANEOUS | Status: DC

## 2018-01-11 MED ORDER — ENOXAPARIN SODIUM 40 MG/0.4ML ~~LOC~~ SOLN
40.0000 mg | SUBCUTANEOUS | Status: DC
  Administered 2018-01-11 – 2018-01-12 (×2): 40 mg via SUBCUTANEOUS
  Filled 2018-01-11 (×2): qty 0.4

## 2018-01-11 MED ORDER — INSULIN ASPART 100 UNIT/ML ~~LOC~~ SOLN
0.0000 [IU] | Freq: Three times a day (TID) | SUBCUTANEOUS | Status: DC
  Administered 2018-01-11: 1 [IU] via SUBCUTANEOUS
  Administered 2018-01-11: 3 [IU] via SUBCUTANEOUS
  Administered 2018-01-12: 2 [IU] via SUBCUTANEOUS

## 2018-01-11 MED ORDER — ACETAMINOPHEN 325 MG PO TABS: 650.0000 mg | ORAL_TABLET | Freq: Four times a day (QID) | ORAL | Status: DC | PRN

## 2018-01-11 MED ORDER — ONDANSETRON HCL 4 MG/2ML IJ SOLN: 4.0000 mg | Freq: Three times a day (TID) | INTRAMUSCULAR | Status: DC | PRN

## 2018-01-11 MED ORDER — ZOLPIDEM TARTRATE 5 MG PO TABS: 5.0000 mg | ORAL_TABLET | Freq: Every evening | ORAL | Status: DC | PRN

## 2018-01-11 MED ORDER — INSULIN GLARGINE 100 UNIT/ML ~~LOC~~ SOLN
5.0000 [IU] | Freq: Every day | SUBCUTANEOUS | Status: DC
  Administered 2018-01-11 – 2018-01-12 (×2): 5 [IU] via SUBCUTANEOUS
  Filled 2018-01-11 (×2): qty 0.05

## 2018-01-11 MED ORDER — ACETAMINOPHEN 650 MG RE SUPP: 650.0000 mg | Freq: Four times a day (QID) | RECTAL | Status: DC | PRN

## 2018-01-11 MED ORDER — IOPAMIDOL (ISOVUE-300) INJECTION 61%
80.0000 mL | Freq: Once | INTRAVENOUS | Status: AC | PRN
  Administered 2018-01-11: 100 mL via INTRAVENOUS

## 2018-01-11 NOTE — Progress Notes (Signed)
Patient seen and evaluated, chart reviewed, please see EMR for updated orders. Please see full H&P dictated by admitting physician Dr. Blaine Hamper for same date of service.    1)Diarrhea-C. difficile PCR negative, GI pathogen pending, supportive and symptomatic treatment, c/n IVF  2)DM- c/n Lantus 5 units daily, Use Novolog/Humalog Sliding scale insulin with Accu-Cheks/Fingersticks as ordered    Patient seen and evaluated, chart reviewed, please see EMR for updated orders. Please see full H&P dictated by admitting physician Dr. Blaine Hamper for same date of service.

## 2018-01-11 NOTE — Progress Notes (Signed)
CRITICAL VALUE ALERT  Critical Value:  Positive norovirus  Date & Time Notied:  01/11/18  Provider Notified: yes  Orders Received/Actions taken:

## 2018-01-11 NOTE — H&P (Addendum)
History and Physical    Brian Owen LNL:[id number] DOB: September 04, 1951 DOA: 01/10/2018  Referring MD/NP/PA:   PCP: Alfonse Spruce, FNP   Patient coming from:  The patient is coming from home.  At baseline, pt is independent for most of ADL.   Chief Complaint: diarrhea   HPI: Brian Owen is a 67 y.o. male with medical history significant of hypertension, diabetes mellitus, CKD-3,  history of parasitic infections in 2016, who presents with diarrhea.  Pt is from [place] originally but has been in this country for years. He has been having watery diarrhea for 2 days. He denies nausea, vomiting, abdominal pain, fever or chills. Per EMS, pt had hypotension with BP 80/60.  EMS gave 500 cc bolus , and then bp is brought up to 148/93. Patient's family reported to EDP that pt has not been compliant with his medications. Blood sugar was 464 per EMS. Patient does not have chest pain, shortness breath, cough, symptoms of UTI or unilateral weakness.  ED Course: pt was found to have WBC 7.4, lipase 55, pending urinalysis, renal function close to baseline, temperature 99.5, blood pressure 138/75, oxygen saturation 97% on room air currently. CT abdomen/pelvis showed fluid in colon, consistent with diarrhea, otherwise not impressive. C diff test positive flu antigen, negative for toxin, pending PCR. Patient is placed on telemetry bed for observation.  Review of Systems:   General: no fevers, chills, no body weight gain, has poor appetite, has fatigue HEENT: no blurry vision, hearing changes or sore throat Respiratory: no dyspnea, coughing, wheezing CV: no chest pain, no palpitations GI: no nausea, vomiting, abdominal pain, has diarrhea, no constipation GU: no dysuria, burning on urination, increased urinary frequency, hematuria  Ext: no leg edema Neuro: no unilateral weakness, numbness, or tingling, no vision change or hearing loss Skin: no rash, no skin tear. MSK: No muscle spasm, no deformity, no  limitation of range of movement in spin Heme: No easy bruising.  Travel history: No recent long distant travel.  Allergy: No Known Allergies  Past Medical History:  Diagnosis Date  . Bullae 11/11/2015   legs/notes 11/11/2015  . Chronic kidney disease   . Heavy cigarette smoker   . Parasite infection 2016   "couldn't afford to have surgery done; just left it; lower legs"  . Uncontrolled type II diabetes mellitus ([place]) dx'd 2013    Past Surgical History:  Procedure Laterality Date  . NO PAST SURGERIES      Social History:  reports that he quit smoking about 4 years ago. His smoking use included cigarettes. He has a 72.00 pack-year smoking history. he has never used smokeless tobacco. He reports that he does not drink alcohol or use drugs.  Family History:  Family History  Problem Relation Age of Onset  . Diabetes Other   . Diabetes Unknown      Prior to Admission medications   Medication Sig Start Date End Date Taking? Authorizing Provider  ACCU-CHEK SOFTCLIX LANCETS lancets 1 each by Other route 3 (three) times daily. Patient not taking: Reported on 01/10/2018 05/02/17   Boykin Nearing, MD  Blood Glucose Monitoring Suppl (ACCU-CHEK AVIVA PLUS) w/Device KIT 1 Device by Does not apply route 3 (three) times daily after meals. Patient not taking: Reported on 01/10/2018 05/02/17   Boykin Nearing, MD  glipiZIDE (GLUCOTROL) 5 MG tablet Take 1 tablet (5 mg total) by mouth daily before breakfast. Patient not taking: Reported on 01/10/2018 08/14/17   Charlott Rakes, MD  glucose blood (ACCU-CHEK AVIVA PLUS)  test strip 1 each by Other route 3 (three) times daily. Patient not taking: Reported on 01/10/2018 05/10/17   Boykin Nearing, MD    Physical Exam: Vitals:   01/11/18 0000 01/11/18 0139 01/11/18 0200 01/11/18 0230  BP: 138/75 117/60 115/70 123/64  Pulse: 88 87 87 88  Resp: '17 16 18 13  ' Temp:  99.2 F (37.3 C)    TempSrc:  Oral    SpO2: 98% 98% 99% 97%   General: Not in acute  distress. Dry mucus and membrane HEENT:       Eyes: PERRL, EOMI, no scleral icterus.       ENT: No discharge from the ears and nose, no pharynx injection, no tonsillar enlargement.        Neck: No JVD, no bruit, no mass felt. Heme: No neck lymph node enlargement. Cardiac: S1/S2, RRR, No murmurs, No gallops or rubs. Respiratory: No rales, wheezing, rhonchi or rubs. GI: Soft, nondistended, nontender, no rebound pain, no organomegaly, BS present. GU: No hematuria Ext: No pitting leg edema bilaterally. 2+DP/PT pulse bilaterally. Musculoskeletal: No joint deformities, No joint redness or warmth, no limitation of ROM in spin. Skin: No rashes.  Neuro: Alert, oriented X3, cranial nerves II-XII grossly intact, moves all extremities normally.  Psych: Patient is not psychotic, no suicidal or hemocidal ideation.  Labs on Admission: I have personally reviewed following labs and imaging studies  CBC: Recent Labs  Lab 01/10/18 2233  WBC 7.4  NEUTROABS 6.1  HGB 11.1*  HCT 33.7*  MCV 89.2  PLT 572   Basic Metabolic Panel: Recent Labs  Lab 01/10/18 2233  NA 136  K 4.4  CL 106  CO2 23  GLUCOSE 367*  BUN 28*  CREATININE 1.60*  CALCIUM 8.5*   GFR: CrCl cannot be calculated (Unknown ideal weight.). Liver Function Tests: Recent Labs  Lab 01/10/18 2233  AST 44*  ALT 42  ALKPHOS 110  BILITOT 0.4  PROT 7.4  ALBUMIN 3.6   Recent Labs  Lab 01/10/18 2233  LIPASE 55*   No results for input(s): AMMONIA in the last 168 hours. Coagulation Profile: No results for input(s): INR, PROTIME in the last 168 hours. Cardiac Enzymes: No results for input(s): CKTOTAL, CKMB, CKMBINDEX, TROPONINI in the last 168 hours. BNP (last 3 results) No results for input(s): PROBNP in the last 8760 hours. HbA1C: No results for input(s): HGBA1C in the last 72 hours. CBG: Recent Labs  Lab 01/10/18 2023  GLUCAP 368*   Lipid Profile: No results for input(s): CHOL, HDL, LDLCALC, TRIG, CHOLHDL,  LDLDIRECT in the last 72 hours. Thyroid Function Tests: No results for input(s): TSH, T4TOTAL, FREET4, T3FREE, THYROIDAB in the last 72 hours. Anemia Panel: No results for input(s): VITAMINB12, FOLATE, FERRITIN, TIBC, IRON, RETICCTPCT in the last 72 hours. Urine analysis:    Component Value Date/Time   COLORURINE COLORLESS (A) 05/03/2017 0214   APPEARANCEUR CLEAR 05/03/2017 0214   LABSPEC 1.005 05/03/2017 0214   PHURINE 7.0 05/03/2017 0214   GLUCOSEU 50 (A) 05/03/2017 0214   HGBUR SMALL (A) 05/03/2017 0214   BILIRUBINUR neg 09/05/2017 1111   KETONESUR NEGATIVE 05/03/2017 0214   PROTEINUR trace 09/05/2017 1111   PROTEINUR NEGATIVE 05/03/2017 0214   UROBILINOGEN 0.2 09/05/2017 1111   UROBILINOGEN 0.2 07/22/2015 1941   NITRITE negative 09/05/2017 1111   NITRITE NEGATIVE 05/03/2017 0214   LEUKOCYTESUR Negative 09/05/2017 1111   Sepsis Labs: '@LABRCNTIP' (procalcitonin:4,lacticidven:4) ) Recent Results (from the past 240 hour(s))  C difficile quick scan w PCR reflex  Status: Abnormal   Collection Time: 01/10/18 10:42 PM  Result Value Ref Range Status   C Diff antigen POSITIVE (A) NEGATIVE Final   C Diff toxin NEGATIVE NEGATIVE Final   C Diff interpretation Results are indeterminate. See PCR results.  Final    Comment: Performed at [facility], [place] [address] [place],  [address]     Radiological Exams on Admission: Ct Abdomen Pelvis W Contrast  Result Date: 01/11/2018 CLINICAL DATA:  Acute onset of diarrhea. Uncontrolled diabetes. Hyperglycemia. EXAM: CT ABDOMEN AND PELVIS WITH CONTRAST TECHNIQUE: Multidetector CT imaging of the abdomen and pelvis was performed using the standard protocol following bolus administration of intravenous contrast. CONTRAST:  19m ISOVUE-300 IOPAMIDOL (ISOVUE-300) INJECTION 61% COMPARISON:  CT of the abdomen and pelvis from 07/23/2014 FINDINGS: Lower chest: The visualized lung bases are grossly clear. Diffuse coronary artery  calcifications are seen. The heart is normal in size. Hepatobiliary: The liver is unremarkable in appearance. The gallbladder is unremarkable in appearance. The common bile duct remains normal in caliber. Pancreas: The pancreas is within normal limits. Spleen: The spleen is unremarkable in appearance. Adrenals/Urinary Tract: The adrenal glands are unremarkable in appearance. There is question of stones at the lower pole of the right kidney, though this could simply reflect contrast. Small left renal cysts are noted. No perinephric stranding is appreciated. There is no evidence of hydronephrosis. No renal or ureteral stones are identified. Stomach/Bowel: The stomach is unremarkable in appearance. The small bowel is within normal limits. The appendix is normal in caliber, without evidence of appendicitis. The colon is partially filled with fluid, likely corresponding to the patient's diarrhea. Vascular/Lymphatic: Scattered calcification is seen along the abdominal aorta and its branches. The abdominal aorta is otherwise grossly unremarkable. The inferior vena cava is grossly unremarkable. No retroperitoneal lymphadenopathy is seen. No pelvic sidewall lymphadenopathy is identified. Reproductive: The bladder is mildly distended and grossly unremarkable. The prostate is borderline normal in size. Other: No additional soft tissue abnormalities are seen. Musculoskeletal: No acute osseous abnormalities are identified. The visualized musculature is unremarkable in appearance. IMPRESSION: 1. Colon partially filled with fluid, likely corresponding to the patient's diarrhea. 2. Small left renal cysts. Question of stones at the lower pole of the right kidney. 3. Diffuse coronary artery calcifications. Aortic Atherosclerosis (ICD10-I70.0). Electronically Signed   By: JGarald BaldingM.D.   On: 01/11/2018 00:45   Dg Chest Port 1 View  Result Date: 01/10/2018 CLINICAL DATA:  Acute onset of hypotension and hyperglycemia. EXAM:  PORTABLE CHEST 1 VIEW COMPARISON:  Chest radiograph performed 02/10/2017 FINDINGS: The lungs are well-aerated and clear. There is no evidence of focal opacification, pleural effusion or pneumothorax. The cardiomediastinal silhouette is within normal limits. No acute osseous abnormalities are seen. IMPRESSION: No acute cardiopulmonary process seen. Electronically Signed   By: JGarald BaldingM.D.   On: 01/10/2018 22:53     EKG: Independently reviewed.  Sinus rhythm, QTC 427, nonspecific T-wave change.   Assessment/Plan Principal Problem:   Diarrhea Active Problems:   HTN (hypertension)   Protein-calorie malnutrition, severe (HCC)   Hypotension   CKD (chronic kidney disease), stage III (HCC)   Type II diabetes mellitus with renal manifestations (HCC)   Diarrhea and dehydratin: etiology is not clear. C diff test is positive for antigen, negative for toxin, indicating possible colonization. Pending C. Difficile PCR. Will not treat as C. Difficile colitis. Possibly due to viral enteritis. Given history of parasite infection, will check ova/parasite.  -will place on tele bed  for obs -IVF: 2.5 L of NS bolus, then 75 cc/h -prn zofran for nausea -f/u GI path panel and Ova/parasite  Hypotension: likely due to dehydration. Blood pressure responded to IV fluid resuscitation. Currently blood pressure is 138/75. -IV fluid as above -Monitor blood pressure closely  HTN (hypertension): not taking meds at home. Now has hypotension. -monitoring Bp -IV hydralazine when necessary  Protein-calorie malnutrition, severe ([place]): -consult nutrition  Type II diabetes mellitus with renal manifestations: Last A1c 11.7 on 07/31/17, poorly controled. Patient is on glipizide at home, but not completely compliant to these medications. Now blood sugar 367, anion gap normal. -start Lantus, 5 U daily -SSI  CKD (chronic kidney disease), stage III ([place]): Baseline creatinine 1.4-1.6 last year. His creatinine is 1.6,  BUN 28. Close to baseline. -Follow-up renal function by BMP   DVT ppx:  SQ Lovenox Code Status: Full code Family Communication: None at bed side.     Disposition Plan:  Anticipate discharge back to previous home environment Consults called:  none Admission status: Obs / tele     Date of Service 01/11/2018    Ivor Costa [facility] Pager [phone]  If 7PM-7AM, please contact night-coverage [email or web address] Password [email or web address] 01/11/2018, 2:51 AM

## 2018-01-11 NOTE — ED Provider Notes (Addendum)
[facility] DEPT Provider Note   CSN: [id number] Arrival date & time: 01/10/18  2010     History   Chief Complaint No chief complaint on file.   HPI Brian Owen is a 67 y.o. male.  Patient from [place] originally but has been in this country for significant period of time.  EMS was called by family.  At home.  At [facility] blood sugar was 464 blood pressure was 80/60.  EMS gave 500 cc bolus which brought patient's blood pressure reportedly up to 148/93.  Clinically this seems unlikely.  Patient has a complaint of diarrhea since yesterday.  Patient's family states he has not been compliant with his medications.  Does have a history of diabetes.  He is on glipizide.  Past medical history significant for heavy cigarette smoking, history of parasitic infections in 2016 uncontrolled type 2 diabetes, and chronic kidney disease.  Patient here is alert and will follow commands.        Past Medical History:  Diagnosis Date  . Bullae 11/11/2015   legs/notes 11/11/2015  . Chronic kidney disease   . Heavy cigarette smoker   . Parasite infection 2016   "couldn't afford to have surgery done; just left it; lower legs"  . Uncontrolled type II diabetes mellitus ([place]) dx'd 2013    Patient Active Problem List   Diagnosis Date Noted  . Absent pedal pulses 09/27/2017  . Syncope 01/13/2017  . Postural dizziness with presyncope   . CKD (chronic kidney disease) 06/16/2016  . Underweight 01/17/2016  . Autonomic neuropathy due to diabetes ([place]) 11/29/2015  . Hyperglycemia 11/27/2015  . Vertigo 11/27/2015  . Orthostatic hypotension 11/27/2015  . SVT (supraventricular tachycardia) ([place]) 11/11/2015  . Protein-calorie malnutrition, severe ([place]) 07/26/2014  . History of tobacco use 06/27/2014  . Edentulism, complete 06/27/2014  . Language barrier to communication 06/27/2014  . HTN (hypertension) 06/27/2014  . Hyponatremia 05/13/2014  . Type 2 diabetes mellitus with  hyperglycemia, with long-term current use of insulin ([place]) 05/13/2014    Past Surgical History:  Procedure Laterality Date  . NO PAST SURGERIES         Home Medications    Prior to Admission medications   Medication Sig Start Date End Date Taking? Authorizing Provider  ACCU-CHEK SOFTCLIX LANCETS lancets 1 each by Other route 3 (three) times daily. Patient not taking: Reported on 01/10/2018 05/02/17   Boykin Nearing, MD  Blood Glucose Monitoring Suppl (ACCU-CHEK AVIVA PLUS) w/Device KIT 1 Device by Does not apply route 3 (three) times daily after meals. Patient not taking: Reported on 01/10/2018 05/02/17   Boykin Nearing, MD  glipiZIDE (GLUCOTROL) 5 MG tablet Take 1 tablet (5 mg total) by mouth daily before breakfast. Patient not taking: Reported on 01/10/2018 08/14/17   Charlott Rakes, MD  glucose blood (ACCU-CHEK AVIVA PLUS) test strip 1 each by Other route 3 (three) times daily. Patient not taking: Reported on 01/10/2018 05/10/17   Boykin Nearing, MD    Family History Family History  Problem Relation Age of Onset  . Diabetes Other   . Diabetes Unknown     Social History Social History   Tobacco Use  . Smoking status: Former Smoker    Packs/day: 1.50    Years: 48.00    Pack years: 72.00    Types: Cigarettes    Last attempt to quit: 2015    Years since quitting: 4.2  . Smokeless tobacco: Never Used  Substance Use Topics  . Alcohol use: No  .  Drug use: No     Allergies   Patient has no known allergies.   Review of Systems Review of Systems  Constitutional: Negative for fever.  HENT: Negative for congestion.   Eyes: Negative for redness.  Respiratory: Negative for shortness of breath.   Cardiovascular: Negative for chest pain.  Gastrointestinal: Positive for diarrhea. Negative for abdominal pain.  Genitourinary: Negative for dysuria.  Musculoskeletal: Negative for myalgias.  Neurological: Negative for syncope.  Hematological: Does not bruise/bleed easily.    Psychiatric/Behavioral: Negative for confusion.     Physical Exam Updated Vital Signs SpO2 98%   Physical Exam  Constitutional: He is oriented to person, place, and time. He appears well-developed and well-nourished. No distress.  HENT:  Head: Normocephalic and atraumatic.  Mucous membranes are dry.  Eyes: Conjunctivae and EOM are normal. Pupils are equal, round, and reactive to light.  Neck: Neck supple.  Cardiovascular: Normal rate, regular rhythm and normal heart sounds.  Pulmonary/Chest: Effort normal and breath sounds normal.  Abdominal: Soft. Bowel sounds are normal. He exhibits no distension. There is no tenderness.  Musculoskeletal: Normal range of motion. He exhibits no edema.  Neurological: He is alert and oriented to person, place, and time. No cranial nerve deficit or sensory deficit. He exhibits normal muscle tone. Coordination normal.  Skin: Skin is warm.  Nursing note and vitals reviewed.    ED Treatments / Results  Labs (all labs ordered are listed, but only abnormal results are displayed) Labs Reviewed  C DIFFICILE QUICK SCREEN W PCR REFLEX - Abnormal; Notable for the following components:      Result Value   C Diff antigen POSITIVE (*)    All other components within normal limits  COMPREHENSIVE METABOLIC PANEL - Abnormal; Notable for the following components:   Glucose, Bld 367 (*)    BUN 28 (*)    Creatinine, Ser 1.60 (*)    Calcium 8.5 (*)    AST 44 (*)    GFR calc non Af Amer 43 (*)    GFR calc Af Amer 50 (*)    All other components within normal limits  LIPASE, BLOOD - Abnormal; Notable for the following components:   Lipase 55 (*)    All other components within normal limits  CBC WITH DIFFERENTIAL/PLATELET - Abnormal; Notable for the following components:   RBC 3.78 (*)    Hemoglobin 11.1 (*)    HCT 33.7 (*)    All other components within normal limits  CBG MONITORING, ED - Abnormal; Notable for the following components:    Glucose-Capillary 368 (*)    All other components within normal limits  CULTURE, BLOOD (ROUTINE X 2)  CULTURE, BLOOD (ROUTINE X 2)  GASTROINTESTINAL PANEL BY PCR, STOOL (REPLACES STOOL CULTURE)  CLOSTRIDIUM DIFFICILE BY PCR, REFLEXED  LACTIC ACID, PLASMA  URINALYSIS, ROUTINE W REFLEX MICROSCOPIC    EKG  EKG Interpretation  Date/Time:  Friday January 10 2018 23:13:49 EDT Ventricular Rate:  85 PR Interval:    QRS Duration: 91 QT Interval:  342 QTC Calculation: 407 R Axis:   69 Text Interpretation:  Sinus rhythm Confirmed by Fredia Sorrow [phone]) on 01/10/2018 11:37:32 PM       Radiology Dg Chest Port 1 View  Result Date: 01/10/2018 CLINICAL DATA:  Acute onset of hypotension and hyperglycemia. EXAM: PORTABLE CHEST 1 VIEW COMPARISON:  Chest radiograph performed 02/10/2017 FINDINGS: The lungs are well-aerated and clear. There is no evidence of focal opacification, pleural effusion or pneumothorax. The cardiomediastinal silhouette is within  normal limits. No acute osseous abnormalities are seen. IMPRESSION: No acute cardiopulmonary process seen. Electronically Signed   By: Garald Balding M.D.   On: 01/10/2018 22:53    Procedures Procedures (including critical care time)  Medications Ordered in ED Medications  0.9 %  sodium chloride infusion (not administered)  sodium chloride 0.9 % bolus 1,000 mL (1,000 mLs Intravenous New Bag/Given 01/10/18 2316)  ondansetron (ZOFRAN) injection 4 mg (4 mg Intravenous Given 01/10/18 2317)  iopamidol (ISOVUE-300) 61 % injection (30 mLs Oral Contrast Given 01/10/18 2309)     Initial Impression / Assessment and Plan / ED Course  I have reviewed the triage vital signs and the nursing notes.  Pertinent labs & imaging results that were available during my care of the patient were reviewed by me and considered in my medical decision making (see chart for details).     Patient's vital signs here no fever.  Pressures have been good not as high as  recorded by EMS after the bolus.  But have been in the low 356 systolic range.  Not tachycardic.  Patient alert appears to be in no acute distress.  Abdomen soft and nontender.  However patient's has had diarrhea here sort of yellow-tan very liquidy in nature.  No blood.  Culture sent as well as C. difficile culture sent.  Patient supposedly has not been on antibiotics.  Patient did have sepsis-like labs although not a code sepsis.  Lactic acid pending blood cultures.  And stool sent for culture as stated.  Suspect patient may have an infectious diarrhea although no fever.  Do not explain the sudden improvement in his blood pressure with a 500 cc bolus.  But certainly could have brought it up to the low 100s.   Chest x-ray negative no evidence of pneumonia or pulmonary edema.  Patient appears dry.  Patient will receive 1 L of normal saline and get CT scan.  Labs still pending.  Actually patient C. difficile antigen is back and is positive.  Will start antibiotics for this.  Patient's blood sugar is elevated here in 368 range.  No evidence of metabolic acidosis on basic labs.  Lactic acid still pending.  CT scan will be helpful in disposition but patient most likely will require admission.  CT scan could put it into a different direction.   Clinically patient does not appear septic patient and vital signs not consistent with sepsis  Correction the C. difficile antigen was positive but not the toxin.  So hold off on antibiotics for now.   Final Clinical Impressions(s) / ED Diagnoses   Final diagnoses:  Hyperglycemia  Diarrhea, unspecified type    ED Discharge Orders    None       Fredia Sorrow, MD 01/11/18 [id number]    Fredia Sorrow, MD 01/11/18 0017  CT scan of abdomen without any acute findings.  But other than findings consistent with diarrhea.  Due to the frequent episodes of diarrhea patient's elderly age and he looks much older than his actual age recommend admission  for IV fluids.  Discussed with hospitalist.  BUN and creatinine is elevated.  Blood sugars are high as well no evidence of any acidosis CO2 is 23.  Lactic acid is still pending.  No tachycardia blood pressure has remained stable.    Fredia Sorrow, MD 01/11/18 [phone]

## 2018-01-12 DIAGNOSIS — N179 Acute kidney failure, unspecified: Secondary | ICD-10-CM | POA: Diagnosis present

## 2018-01-12 DIAGNOSIS — A0811 Acute gastroenteropathy due to Norwalk agent: Secondary | ICD-10-CM | POA: Diagnosis not present

## 2018-01-12 LAB — BASIC METABOLIC PANEL
Anion gap: 7 (ref 5–15)
BUN: 12 mg/dL (ref 6–20)
CO2: 24 mmol/L (ref 22–32)
CREATININE: 1.26 mg/dL — AB (ref 0.61–1.24)
Calcium: 8.5 mg/dL — ABNORMAL LOW (ref 8.9–10.3)
Chloride: 107 mmol/L (ref 101–111)
GFR calc Af Amer: >60 mL/min (ref >60)
GFR, EST NON AFRICAN AMERICAN: 57 mL/min — AB (ref >60)
GLUCOSE: 89 mg/dL (ref 65–99)
Potassium: 3.5 mmol/L (ref 3.5–5.1)
Sodium: 138 mmol/L (ref 135–145)

## 2018-01-12 LAB — CBC
HEMATOCRIT: 33.5 % — AB (ref 39.0–52.0)
Hemoglobin: 10.9 g/dL — ABNORMAL LOW (ref 13.0–17.0)
MCH: 29.5 pg (ref 26.0–34.0)
MCHC: 32.5 g/dL (ref 30.0–36.0)
MCV: 90.5 fL (ref 78.0–100.0)
PLATELETS: 149 10*3/uL — AB (ref 150–400)
RBC: 3.7 MIL/uL — ABNORMAL LOW (ref 4.22–5.81)
RDW: 13.1 % (ref 11.5–15.5)
WBC: 6 10*3/uL (ref 4.0–10.5)

## 2018-01-12 LAB — GLUCOSE, CAPILLARY
Glucose-Capillary: 153 mg/dL — ABNORMAL HIGH (ref 65–99)
Glucose-Capillary: 85 mg/dL (ref 65–99)

## 2018-01-12 MED ORDER — GLUCERNA SHAKE PO LIQD
237.0000 mL | Freq: Three times a day (TID) | ORAL | Status: DC
  Administered 2018-01-12: 237 mL via ORAL
  Filled 2018-01-12 (×2): qty 237

## 2018-01-12 MED ORDER — ONDANSETRON 4 MG PO TBDP: 4.0000 mg | ORAL_TABLET | ORAL | 0 refills | Status: DC | PRN

## 2018-01-12 NOTE — Progress Notes (Signed)
Per lab O&P needs to be recollected. Spoke with MD. Do not need to recollect patient may discharge home.

## 2018-01-12 NOTE — Discharge Summary (Signed)
Brian Owen, is a 67 y.o. male  DOB 01-19-1951  MRN [id number].  Admission date:  01/10/2018  Admitting Physician  Ivor Costa, MD  Discharge Date:  01/12/2018   Primary MD  Alfonse Spruce, FNP  Recommendations for primary care physician for things to follow:   1)Please see information included in your Discharge package about NoroVirus Gastroenteritis  2)Drink plenty fluids  3)Check your blood sugar at least 3-4 times a day  4)Follow-up with your regular doctor for recheck within 1 week, sooner if you are not feeling better   Admission Diagnosis  Hyperglycemia [R73.9] Diarrhea, unspecified type [R19.7]  Discharge Diagnosis  Hyperglycemia [R73.9] Diarrhea, unspecified type [R19.7]   Principal Problem:   Gastroenteritis due to norovirus Active Problems:   HTN (hypertension)   Protein-calorie malnutrition, severe (HCC)   Diarrhea   Hypotension   CKD (chronic kidney disease), stage III (HCC)   Type II diabetes mellitus with renal manifestations ([place])   AKI (acute kidney injury) ([place])      Past Medical History:  Diagnosis Date  . Bullae 11/11/2015   legs/notes 11/11/2015  . Chronic kidney disease   . Heavy cigarette smoker   . Parasite infection 2016   "couldn't afford to have surgery done; just left it; lower legs"  . Uncontrolled type II diabetes mellitus ([place]) dx'd 2013    Past Surgical History:  Procedure Laterality Date  . NO PAST SURGERIES         HPI  from the history and physical done on the day of admission:    PCP: Alfonse Spruce, FNP   Patient coming from:  The patient is coming from home.  At baseline, pt is independent for most of ADL.   Chief Complaint: diarrhea   HPI: Brian Owen is a 66 y.o. male with medical history significant of hypertension, diabetes mellitus, CKD-3, history of parasitic infections in 2016, who presents with diarrhea.  Pt is from  [place] originally but has been in this country for years. He has been having watery diarrhea for 2 days. He denies nausea, vomiting, abdominal pain, fever or chills. Per EMS, pt had hypotension with BP 80/60. EMS gave 500 cc bolus , and then bp is brought up to 148/93. Patient's family reported to EDP that pt has not been compliant with his medications. Blood sugar was 464 per EMS. Patient does not have chest pain, shortness breath, cough, symptoms of UTI or unilateral weakness.  ED Course: pt was found to have WBC 7.4, lipase 55, pending urinalysis, renal function close to baseline, temperature 99.5, blood pressure 138/75, oxygen saturation 97% on room air currently. CT abdomen/pelvis showed fluid in colon, consistent with diarrhea, otherwise not impressive. C diff test positive flu antigen, negative for toxin, pending PCR. Patient is placed on telemetry bed for observation.   Hospital Course:       1)Diarrhea/ Norovirus Gastroenteritis-   clinically much better, and frequency and volume of stools has improved significantly, no dizziness eating and drinking well C. difficile PCR negative,  GI pathogen consistent with Norovirus Gastroenteritis, supportive and symptomatic treatment, c/n IVF  2)DM-stable resume home regiment  3)Aki on CKD III-patient developed acute kidney injury superimposed on baseline CKD due to diarrhea/GI losses and poor oral intake, baseline creatinine usually around 1.5, creatinine peaked at 1.95, creatinine today prior to discharge is 1.2, much improved with hydration,  Discharge Condition: stable  Follow UP- pcp  Diet and Activity recommendation:  As advised  Discharge Instructions     Discharge Instructions    Call MD for:  difficulty breathing, headache or visual disturbances   Complete by:  As directed    Call MD for:  persistant dizziness or light-headedness   Complete by:  As directed    Call MD for:  persistant nausea and vomiting   Complete by:  As  directed    Call MD for:  temperature >100.4   Complete by:  As directed    Diet Carb Modified   Complete by:  As directed    Discharge instructions   Complete by:  As directed    1)Please see information included in your Discharge package about NoroVirus Gastroenteritis  2) drink plenty fluids  3) check your blood sugar at least 3-4 times a day  4) follow-up with your regular doctor for recheck within 1 week, sooner if you are not feeling better   Increase activity slowly   Complete by:  As directed        Discharge Medications     Allergies as of 01/12/2018   No Known Allergies     Medication List    TAKE these medications   ACCU-CHEK AVIVA PLUS w/Device Kit 1 Device by Does not apply route 3 (three) times daily after meals.   ACCU-CHEK SOFTCLIX LANCETS lancets 1 each by Other route 3 (three) times daily.   glipiZIDE 5 MG tablet Commonly known as:  GLUCOTROL Take 1 tablet (5 mg total) by mouth daily before breakfast.   glucose blood test strip Commonly known as:  ACCU-CHEK AVIVA PLUS 1 each by Other route 3 (three) times daily.   ondansetron 4 MG disintegrating tablet Commonly known as:  ZOFRAN ODT Take 1 tablet (4 mg total) by mouth every 4 (four) hours as needed for nausea or vomiting.       Major procedures and Radiology Reports - PLEASE review detailed and final reports for all details, in brief -   Ct Abdomen Pelvis W Contrast  Result Date: 01/11/2018 CLINICAL DATA:  Acute onset of diarrhea. Uncontrolled diabetes. Hyperglycemia. EXAM: CT ABDOMEN AND PELVIS WITH CONTRAST TECHNIQUE: Multidetector CT imaging of the abdomen and pelvis was performed using the standard protocol following bolus administration of intravenous contrast. CONTRAST:  165m ISOVUE-300 IOPAMIDOL (ISOVUE-300) INJECTION 61% COMPARISON:  CT of the abdomen and pelvis from 07/23/2014 FINDINGS: Lower chest: The visualized lung bases are grossly clear. Diffuse coronary artery calcifications are  seen. The heart is normal in size. Hepatobiliary: The liver is unremarkable in appearance. The gallbladder is unremarkable in appearance. The common bile duct remains normal in caliber. Pancreas: The pancreas is within normal limits. Spleen: The spleen is unremarkable in appearance. Adrenals/Urinary Tract: The adrenal glands are unremarkable in appearance. There is question of stones at the lower pole of the right kidney, though this could simply reflect contrast. Small left renal cysts are noted. No perinephric stranding is appreciated. There is no evidence of hydronephrosis. No renal or ureteral stones are identified. Stomach/Bowel: The stomach is unremarkable in appearance. The small bowel is within  normal limits. The appendix is normal in caliber, without evidence of appendicitis. The colon is partially filled with fluid, likely corresponding to the patient's diarrhea. Vascular/Lymphatic: Scattered calcification is seen along the abdominal aorta and its branches. The abdominal aorta is otherwise grossly unremarkable. The inferior vena cava is grossly unremarkable. No retroperitoneal lymphadenopathy is seen. No pelvic sidewall lymphadenopathy is identified. Reproductive: The bladder is mildly distended and grossly unremarkable. The prostate is borderline normal in size. Other: No additional soft tissue abnormalities are seen. Musculoskeletal: No acute osseous abnormalities are identified. The visualized musculature is unremarkable in appearance. IMPRESSION: 1. Colon partially filled with fluid, likely corresponding to the patient's diarrhea. 2. Small left renal cysts. Question of stones at the lower pole of the right kidney. 3. Diffuse coronary artery calcifications. Aortic Atherosclerosis (ICD10-I70.0). Electronically Signed   By: Garald Balding M.D.   On: 01/11/2018 00:45   Dg Chest Port 1 View  Result Date: 01/10/2018 CLINICAL DATA:  Acute onset of hypotension and hyperglycemia. EXAM: PORTABLE CHEST 1 VIEW  COMPARISON:  Chest radiograph performed 02/10/2017 FINDINGS: The lungs are well-aerated and clear. There is no evidence of focal opacification, pleural effusion or pneumothorax. The cardiomediastinal silhouette is within normal limits. No acute osseous abnormalities are seen. IMPRESSION: No acute cardiopulmonary process seen. Electronically Signed   By: Garald Balding M.D.   On: 01/10/2018 22:53    Micro Results   Recent Results (from the past 240 hour(s))  Gastrointestinal Panel by PCR , Stool     Status: Abnormal   Collection Time: 01/10/18 10:42 PM  Result Value Ref Range Status   Campylobacter species NOT DETECTED NOT DETECTED Final   Plesimonas shigelloides NOT DETECTED NOT DETECTED Final   Salmonella species NOT DETECTED NOT DETECTED Final   Yersinia enterocolitica NOT DETECTED NOT DETECTED Final   Vibrio species NOT DETECTED NOT DETECTED Final   Vibrio cholerae NOT DETECTED NOT DETECTED Final   Enteroaggregative E coli (EAEC) NOT DETECTED NOT DETECTED Final   Enteropathogenic E coli (EPEC) NOT DETECTED NOT DETECTED Final   Enterotoxigenic E coli (ETEC) NOT DETECTED NOT DETECTED Final   Shiga like toxin producing E coli (STEC) NOT DETECTED NOT DETECTED Final   Shigella/Enteroinvasive E coli (EIEC) NOT DETECTED NOT DETECTED Final   Cryptosporidium NOT DETECTED NOT DETECTED Final   Cyclospora cayetanensis NOT DETECTED NOT DETECTED Final   Entamoeba histolytica NOT DETECTED NOT DETECTED Final   Giardia lamblia NOT DETECTED NOT DETECTED Final   Adenovirus F40/41 NOT DETECTED NOT DETECTED Final   Astrovirus NOT DETECTED NOT DETECTED Final   Norovirus GI/GII DETECTED (A) NOT DETECTED Final    Comment: RESULT CALLED TO, READ BACK BY AND VERIFIED WITH: REBECCA GREENAWALT 01/11/18 @ 1426  [place]    Rotavirus A NOT DETECTED NOT DETECTED Final   Sapovirus (I, II, IV, and V) NOT DETECTED NOT DETECTED Final    Comment: Performed at [facility], [address] [place], [place]  [address]  C difficile quick scan w PCR reflex     Status: Abnormal   Collection Time: 01/10/18 10:42 PM  Result Value Ref Range Status   C Diff antigen POSITIVE (A) NEGATIVE Final   C Diff toxin NEGATIVE NEGATIVE Final   C Diff interpretation Results are indeterminate. See PCR results.  Final    Comment: Performed at [facility], [place] [address] [place], [place] [address]  C. Diff by PCR, Reflexed     Status: None   Collection Time: 01/10/18 10:42 PM  Result Value Ref Range Status   Toxigenic C. Difficile by PCR NEGATIVE NEGATIVE Final    Comment: Patient is colonized with non toxigenic C. difficile. May not need treatment unless significant symptoms are present. Performed at [facility], [place] [address] [place], [place] [address]   Culture, blood (Routine X 2) w Reflex to ID Panel     Status: None (Preliminary result)   Collection Time: 01/11/18  6:18 AM  Result Value Ref Range Status   Specimen Description   Final    BLOOD RIGHT ANTECUBITAL Performed at [facility] [address], [place], [place] [address]    Special Requests   Final    BOTTLES DRAWN AEROBIC ONLY Blood Culture adequate volume Performed at [facility] [address], [place], [place] [address]    Culture   Final    NO GROWTH < 24 HOURS Performed at [facility] [address] [place], [place] [address]    Report Status PENDING  Incomplete  Culture, blood (Routine X 2) w Reflex to ID Panel     Status: None (Preliminary result)   Collection Time: 01/11/18  6:19 AM  Result Value Ref Range Status   Specimen Description   Final    BLOOD LEFT HAND Performed at [facility] [address], [place], [place] [address]    Special Requests   Final    BOTTLES DRAWN AEROBIC ONLY Blood Culture adequate volume Performed at [facility] [address] [place], [place] [address]    Culture   Final    NO  GROWTH < 24 HOURS Performed at [facility] [address], [place], [place] [address]    Report Status PENDING  Incomplete       Today   Subjective    Brian Owen today has no new complaints, eating/drinking well,           Patient has been seen and examined prior to discharge   Objective   Blood pressure 127/71, pulse 74, temperature 98 F (36.7 C), temperature source Oral, resp. rate 16, weight 48.9 kg (107 lb 14.4 oz), SpO2 100 %.   Intake/Output Summary (Last 24 hours) at 01/12/2018 1445 Last data filed at 01/12/2018 1238 Gross per 24 hour  Intake 1983.75 ml  Output 1 ml  Net 1982.75 ml    Exam Gen:- Awake Alert,  In no apparent distress  HEENT:- [place].AT, No sclera icterus Neck-Supple Neck,No JVD,.  Lungs-  CTAB  CV- S1, S2 normal Abd-  +ve B.Sounds, Abd Soft, No tenderness,    Extremity/Skin:- No  edema,    Psych-affect is appropriate, oriented x3 Neuro-no new focal deficits, no tremors   Data Review   CBC w Diff:  Lab Results  Component Value Date   WBC 6.0 01/12/2018   HGB 10.9 (L) 01/12/2018   HCT 33.5 (L) 01/12/2018   HCT 31 08/10/2014   PLT 149 (L) 01/12/2018   PLT 261 08/10/2014   LYMPHOPCT 13 01/10/2018   MONOPCT 2 01/10/2018   EOSPCT 2 01/10/2018   BASOPCT 0 01/10/2018    CMP:  Lab Results  Component Value Date   NA 138 01/12/2018   NA 133 (L) 07/31/2017   K 3.5 01/12/2018   CL 107 01/12/2018   CO2 24 01/12/2018   BUN 12 01/12/2018   BUN 37 (H) 07/31/2017   BUN 17 08/10/2014   CREATININE 1.26 (H) 01/12/2018   CREATININE 1.28 (  H) 01/17/2016   PROT 7.4 01/10/2018   ALBUMIN 3.6 01/10/2018   BILITOT 0.4 01/10/2018   ALKPHOS 110 01/10/2018   AST 44 (H) 01/10/2018   ALT 42 01/10/2018  .   Total Discharge time is about 33 minutes  Roxan Hockey M.D on 01/12/2018 at 2:45 PM  [facility]   Office  [phone]  Voice Recognition Viviann Spare dictation system was used to create this note, attempts have been made to  correct errors. Please contact the author with questions and/or clarifications.

## 2018-01-12 NOTE — Progress Notes (Signed)
Initial Nutrition Assessment  INTERVENTION:   Provide Glucerna Shake po TID, each supplement provides 220 kcal and 10 grams of protein  NUTRITION DIAGNOSIS:   Inadequate oral intake related to poor appetite as evidenced by per patient/family report.  GOAL:   Patient will meet greater than or equal to 90% of their needs  MONITOR:   PO intake, Supplement acceptance, Labs, Weight trends, Skin, I & O's  REASON FOR ASSESSMENT:   Consult Assessment of nutrition requirement/status  ASSESSMENT:   67 y.o. male with medical history significant of hypertension, diabetes mellitus, CKD-3,  history of parasitic infections in 2016, who presents with diarrhea.  Assessment unable to be completed fully d/t no recorded weight for this admission. Last weight is from November 2018.  Per chart, pt is consuming 50-100% of meals. Ordered chicken soup and rice for lunch today. Will continue to monitor  PO intakes. Will order Glucerna shakes to supplement intakes.  Per chart review, diarrhea continues. Pt is positive for norovirus.  Medications reviewed. Labs reviewed: CBGs: 85-153   NUTRITION - FOCUSED PHYSICAL EXAM:  Deferred.  Diet Order:  Diet heart healthy/carb modified Room service appropriate? Yes; Fluid consistency: Thin  EDUCATION NEEDS:   No education needs have been identified at this time  Skin:  Skin Assessment: Reviewed RN Assessment  Last BM:  3/17  Height:   Ht Readings from Last 1 Encounters:  09/27/17 5\' 7"  (1.702 m)    Weight:   Wt Readings from Last 1 Encounters:  09/27/17 116 lb (52.6 kg)    Ideal Body Weight:  67.2 kg -from height recorded on 09/27/17  BMI:  There is no height or weight on file to calculate BMI.  Estimated Nutritional Needs:   Kcal:  will calculate once weight is known for this admission  Protein:   "  Fluid:   "  Clayton Bibles, MS, RD, [place] Dietitian Pager: [phone] After Hours Pager: [phone]

## 2018-01-12 NOTE — Progress Notes (Signed)
Discharge instructions and medications discussed with patient and wife.  All questions answered. Prescription and AVS given to patient.

## 2018-01-16 LAB — CULTURE, BLOOD (ROUTINE X 2)
Culture: NO GROWTH
Culture: NO GROWTH
SPECIAL REQUESTS: ADEQUATE
SPECIAL REQUESTS: ADEQUATE

## 2018-08-13 ENCOUNTER — Emergency Department (HOSPITAL_COMMUNITY)
Admission: EM | Admit: 2018-08-13 | Discharge: 2018-08-13 | Disposition: A | Discharge status: Home / Self Care | Payer: Medicare Other | Attending: Emergency Medicine | Admitting: Emergency Medicine

## 2018-08-13 ENCOUNTER — Encounter (HOSPITAL_COMMUNITY): Payer: Self-pay

## 2018-08-13 ENCOUNTER — Other Ambulatory Visit: Payer: Self-pay

## 2018-08-13 ENCOUNTER — Emergency Department (HOSPITAL_COMMUNITY): Payer: Medicare Other

## 2018-08-13 DIAGNOSIS — R55 Syncope and collapse: Secondary | ICD-10-CM

## 2018-08-13 DIAGNOSIS — E1122 Type 2 diabetes mellitus with diabetic chronic kidney disease: Secondary | ICD-10-CM | POA: Diagnosis not present

## 2018-08-13 DIAGNOSIS — Y939 Activity, unspecified: Secondary | ICD-10-CM | POA: Insufficient documentation

## 2018-08-13 DIAGNOSIS — I10 Essential (primary) hypertension: Secondary | ICD-10-CM | POA: Diagnosis not present

## 2018-08-13 DIAGNOSIS — Z23 Encounter for immunization: Secondary | ICD-10-CM | POA: Insufficient documentation

## 2018-08-13 DIAGNOSIS — S0003XA Contusion of scalp, initial encounter: Secondary | ICD-10-CM | POA: Insufficient documentation

## 2018-08-13 DIAGNOSIS — E119 Type 2 diabetes mellitus without complications: Secondary | ICD-10-CM | POA: Insufficient documentation

## 2018-08-13 DIAGNOSIS — S299XXA Unspecified injury of thorax, initial encounter: Secondary | ICD-10-CM | POA: Diagnosis not present

## 2018-08-13 DIAGNOSIS — Y999 Unspecified external cause status: Secondary | ICD-10-CM | POA: Insufficient documentation

## 2018-08-13 DIAGNOSIS — N189 Chronic kidney disease, unspecified: Secondary | ICD-10-CM | POA: Diagnosis not present

## 2018-08-13 DIAGNOSIS — Z7984 Long term (current) use of oral hypoglycemic drugs: Secondary | ICD-10-CM | POA: Diagnosis not present

## 2018-08-13 DIAGNOSIS — W19XXXA Unspecified fall, initial encounter: Secondary | ICD-10-CM | POA: Insufficient documentation

## 2018-08-13 DIAGNOSIS — E1165 Type 2 diabetes mellitus with hyperglycemia: Secondary | ICD-10-CM | POA: Diagnosis not present

## 2018-08-13 DIAGNOSIS — Z87891 Personal history of nicotine dependence: Secondary | ICD-10-CM | POA: Diagnosis not present

## 2018-08-13 DIAGNOSIS — I129 Hypertensive chronic kidney disease with stage 1 through stage 4 chronic kidney disease, or unspecified chronic kidney disease: Secondary | ICD-10-CM | POA: Diagnosis not present

## 2018-08-13 DIAGNOSIS — N183 Chronic kidney disease, stage 3 (moderate): Secondary | ICD-10-CM | POA: Diagnosis not present

## 2018-08-13 DIAGNOSIS — S199XXA Unspecified injury of neck, initial encounter: Secondary | ICD-10-CM | POA: Diagnosis not present

## 2018-08-13 DIAGNOSIS — Z794 Long term (current) use of insulin: Secondary | ICD-10-CM

## 2018-08-13 DIAGNOSIS — E1369 Other specified diabetes mellitus with other specified complication: Secondary | ICD-10-CM

## 2018-08-13 DIAGNOSIS — Y92009 Unspecified place in unspecified non-institutional (private) residence as the place of occurrence of the external cause: Secondary | ICD-10-CM | POA: Insufficient documentation

## 2018-08-13 LAB — CBC WITH DIFFERENTIAL/PLATELET
ABS IMMATURE GRANULOCYTES: 0 10*3/uL (ref 0.00–0.07)
Basophils Absolute: 0 10*3/uL (ref 0.0–0.1)
Basophils Relative: 1 %
EOS PCT: 6 %
Eosinophils Absolute: 0.3 10*3/uL (ref 0.0–0.5)
HEMATOCRIT: 37.6 % — AB (ref 39.0–52.0)
HEMOGLOBIN: 12.3 g/dL — AB (ref 13.0–17.0)
Immature Granulocytes: 0 %
LYMPHS ABS: 1.6 10*3/uL (ref 0.7–4.0)
Lymphocytes Relative: 40 %
MCH: 29.2 pg (ref 26.0–34.0)
MCHC: 32.7 g/dL (ref 30.0–36.0)
MCV: 89.3 fL (ref 80.0–100.0)
MONO ABS: 0.3 10*3/uL (ref 0.1–1.0)
MONOS PCT: 7 %
NEUTROS ABS: 1.9 10*3/uL (ref 1.7–7.7)
Neutrophils Relative %: 46 %
Platelets: 162 10*3/uL (ref 150–400)
RBC: 4.21 MIL/uL — ABNORMAL LOW (ref 4.22–5.81)
RDW: 13 % (ref 11.5–15.5)
WBC: 4 10*3/uL (ref 4.0–10.5)
nRBC: 0 % (ref 0.0–0.2)

## 2018-08-13 LAB — BASIC METABOLIC PANEL
Anion gap: 7 (ref 5–15)
BUN: 24 mg/dL — AB (ref 8–23)
CO2: 29 mmol/L (ref 22–32)
CREATININE: 1.52 mg/dL — AB (ref 0.61–1.24)
Calcium: 9.8 mg/dL (ref 8.9–10.3)
Chloride: 100 mmol/L (ref 98–111)
GFR calc Af Amer: 53 mL/min — ABNORMAL LOW (ref >60)
GFR calc non Af Amer: 46 mL/min — ABNORMAL LOW (ref >60)
GLUCOSE: 226 mg/dL — AB (ref 70–99)
Potassium: 5 mmol/L (ref 3.5–5.1)
SODIUM: 136 mmol/L (ref 135–145)

## 2018-08-13 LAB — I-STAT TROPONIN, ED: Troponin i, poc: 0 ng/mL (ref 0.00–0.08)

## 2018-08-13 LAB — HEMOGLOBIN A1C
HEMOGLOBIN A1C: 14.3 % — AB (ref 4.8–5.6)
Mean Plasma Glucose: 363.71 mg/dL

## 2018-08-13 MED ORDER — ACCU-CHEK AVIVA PLUS W/DEVICE KIT: 1.0000 | PACK | Freq: Three times a day (TID) | 0 refills | Status: AC

## 2018-08-13 MED ORDER — ACCU-CHEK SOFTCLIX LANCETS MISC
1.0000 | Freq: Three times a day (TID) | 12 refills | Status: AC
Start: 2018-08-13 — End: ?

## 2018-08-13 MED ORDER — SODIUM CHLORIDE 0.9 % IV BOLUS
1000.0000 mL | Freq: Once | INTRAVENOUS | Status: AC
  Administered 2018-08-13: 1000 mL via INTRAVENOUS

## 2018-08-13 MED ORDER — TETANUS-DIPHTH-ACELL PERTUSSIS 5-2.5-18.5 LF-MCG/0.5 IM SUSP
0.5000 mL | Freq: Once | INTRAMUSCULAR | Status: AC
  Administered 2018-08-13: 0.5 mL via INTRAMUSCULAR
  Filled 2018-08-13: qty 0.5

## 2018-08-13 MED ORDER — GLIPIZIDE 5 MG PO TABS: 5.0000 mg | ORAL_TABLET | Freq: Every day | ORAL | 2 refills | Status: DC

## 2018-08-13 MED ORDER — GLUCOSE BLOOD VI STRP: 1.0000 | ORAL_STRIP | Freq: Three times a day (TID) | 12 refills | Status: DC

## 2018-08-13 NOTE — Discharge Instructions (Addendum)
You were evaluated in the emergency department for a fall in which she struck your head.  You had blood work and a CAT scan of your head and neck.  Your blood sugar was high here and you need to be taking the medications that the doctors prescribed for you.  You should continue to use ice for the swelling in the back of your head.  Return if any problems.

## 2018-08-13 NOTE — ED Triage Notes (Signed)
Pt brought in by EMS due to having a syncopal episode. Pt did LOC. Pt his his head. Pt has 1in lac on back of his head. Pt speaks [place].

## 2018-08-13 NOTE — ED Provider Notes (Signed)
[facility] EMERGENCY DEPARTMENT Provider Note   CSN: [id number] Arrival date & time: 08/13/18  1413     History   Chief Complaint Chief Complaint  Patient presents with  . Loss of Consciousness    HPI Nichole Patton is a 67 y.o. male.  He presents by EMS after a fall at home.  Patient is primarily [place] speaking in much of the history is through the interpreter.  He said he was trying to let the landlord in when he felt lightheaded and the landlord could not catch him in time and he fell back and hit his head.  He is complaining of a headache but that was actually there from before he fell.  He says he is actually had a headache for the last 3 years.  He also has not taken any of his medicine in a couple years.  It looks like on prior review he is a diabetic and has not been checking his sugars.  He denies any blurry vision numbness weakness chest pain shortness of breath abdominal pain or trouble with urination.  The history is provided by the patient.  Loss of Consciousness   This is a new problem. The current episode started less than 1 hour ago. The problem has been resolved. He lost consciousness for a period of less than one minute. The problem is associated with normal activity. Associated symptoms include dizziness and headaches. Pertinent negatives include abdominal pain, bowel incontinence, chest pain, confusion, fever, nausea, seizures, slurred speech, vomiting and weakness. He has tried nothing for the symptoms. The treatment provided no relief. His past medical history is significant for DM.    Past Medical History:  Diagnosis Date  . Bullae 11/11/2015   legs/notes 11/11/2015  . Chronic kidney disease   . Heavy cigarette smoker   . Parasite infection 2016   "couldn't afford to have surgery done; just left it; lower legs"  . Uncontrolled type II diabetes mellitus ([place]) dx'd 2013    Patient Active Problem List   Diagnosis Date Noted  . Gastroenteritis  due to norovirus 01/12/2018  . AKI (acute kidney injury) ([place]) 01/12/2018  . Type II diabetes mellitus with renal manifestations ([place]) 01/11/2018  . Absent pedal pulses 09/27/2017  . Syncope 01/13/2017  . Postural dizziness with presyncope   . CKD (chronic kidney disease), stage III ([place]) 06/16/2016  . Underweight 01/17/2016  . Autonomic neuropathy due to diabetes ([place]) 11/29/2015  . Hyperglycemia 11/27/2015  . Hypotension 11/27/2015  . Vertigo 11/27/2015  . Orthostatic hypotension 11/27/2015  . SVT (supraventricular tachycardia) ([place]) 11/11/2015  . Diarrhea 08/17/2014  . Protein-calorie malnutrition, severe ([place]) 07/26/2014  . History of tobacco use 06/27/2014  . Edentulism, complete 06/27/2014  . Language barrier to communication 06/27/2014  . HTN (hypertension) 06/27/2014  . Hyponatremia 05/13/2014  . Type 2 diabetes mellitus with hyperglycemia, with long-term current use of insulin ([place]) 05/13/2014    Past Surgical History:  Procedure Laterality Date  . NO PAST SURGERIES          Home Medications    Prior to Admission medications   Medication Sig Start Date End Date Taking? Authorizing Provider  ACCU-CHEK SOFTCLIX LANCETS lancets 1 each by Other route 3 (three) times daily. Patient not taking: Reported on 01/10/2018 05/02/17   Boykin Nearing, MD  Blood Glucose Monitoring Suppl (ACCU-CHEK AVIVA PLUS) w/Device KIT 1 Device by Does not apply route 3 (three) times daily after meals. Patient not taking: Reported on 01/10/2018 05/02/17  [name], Josalyn, MD  glipiZIDE (GLUCOTROL) 5 MG tablet Take 1 tablet (5 mg total) by mouth daily before breakfast. Patient not taking: Reported on 01/10/2018 08/14/17   Charlott Rakes, MD  glucose blood (ACCU-CHEK AVIVA PLUS) test strip 1 each by Other route 3 (three) times daily. Patient not taking: Reported on 01/10/2018 05/10/17   Boykin Nearing, MD  ondansetron (ZOFRAN ODT) 4 MG disintegrating tablet Take 1 tablet (4 mg total) by mouth  every 4 (four) hours as needed for nausea or vomiting. 01/12/18   Roxan Hockey, MD    Family History Family History  Problem Relation Age of Onset  . Diabetes Other   . Diabetes Unknown     Social History Social History   Tobacco Use  . Smoking status: Former Smoker    Packs/day: 1.50    Years: 48.00    Pack years: 72.00    Types: Cigarettes    Last attempt to quit: 2015    Years since quitting: 4.7  . Smokeless tobacco: Never Used  Substance Use Topics  . Alcohol use: No  . Drug use: No     Allergies   Patient has no known allergies.   Review of Systems Review of Systems  Constitutional: Negative for fever.  HENT: Negative for sore throat.   Eyes: Negative for visual disturbance.  Respiratory: Negative for shortness of breath.   Cardiovascular: Positive for syncope. Negative for chest pain.  Gastrointestinal: Negative for abdominal pain, bowel incontinence, nausea and vomiting.  Genitourinary: Negative for dysuria.  Musculoskeletal: Negative for neck pain.  Skin: Positive for wound. Negative for rash.  Neurological: Positive for dizziness and headaches. Negative for seizures and weakness.  Psychiatric/Behavioral: Negative for confusion.     Physical Exam Updated Vital Signs BP (!) 188/99 (BP Location: Right Arm)   Pulse 86   Temp 97.9 F (36.6 C) (Oral)   Resp 18   Ht '5\' 1"'  (1.549 m)   Wt 49.9 kg   SpO2 98%   BMI 20.78 kg/m   Physical Exam  Constitutional: He appears well-developed and well-nourished.  HENT:  Head: Normocephalic.  Right Ear: External ear normal.  Left Ear: External ear normal.  Nose: Nose normal.  Mouth/Throat: Oropharynx is clear and moist.  Patient has a contusion to the back of his head and approximately 1.5 cm laceration that is not bleeding.  Eyes: Pupils are equal, round, and reactive to light. Conjunctivae and EOM are normal.  Neck: Neck supple.  Cardiovascular: Normal rate and regular rhythm.  No murmur  heard. Pulmonary/Chest: Effort normal and breath sounds normal. No respiratory distress.  Abdominal: Soft. There is no tenderness.  Musculoskeletal: Normal range of motion. He exhibits no edema, tenderness or deformity.  Neurological: He is alert. He has normal strength. No sensory deficit. Coordination normal. GCS eye subscore is 4. GCS verbal subscore is 5. GCS motor subscore is 6.  Skin: Skin is warm and dry.  Psychiatric: He has a normal mood and affect.  Nursing note and vitals reviewed.    ED Treatments / Results  Labs (all labs ordered are listed, but only abnormal results are displayed) Labs Reviewed  BASIC METABOLIC PANEL - Abnormal; Notable for the following components:      Result Value   Glucose, Bld 226 (*)    BUN 24 (*)    Creatinine, Ser 1.52 (*)    GFR calc non Af Amer 46 (*)    GFR calc Af Amer 53 (*)    All other components  within normal limits  CBC WITH DIFFERENTIAL/PLATELET - Abnormal; Notable for the following components:   RBC 4.21 (*)    Hemoglobin 12.3 (*)    HCT 37.6 (*)    All other components within normal limits  HEMOGLOBIN A1C - Abnormal; Notable for the following components:   Hgb A1c MFr Bld 14.3 (*)    All other components within normal limits  I-STAT TROPONIN, ED    EKG EKG Interpretation  Date/Time:  Wednesday August 13 2018 14:15:14 EDT Ventricular Rate:  87 PR Interval:    QRS Duration: 84 QT Interval:  396 QTC Calculation: 477 R Axis:   75 Text Interpretation:  Sinus rhythm Borderline prolonged QT interval similar to prior 3/19 Confirmed by Aletta Edouard [phone]) on 08/13/2018 2:27:32 PM   Radiology Dg Chest 2 View  Result Date: 08/13/2018 CLINICAL DATA:  Syncope with fall. EXAM: CHEST - 2 VIEW COMPARISON:  01/10/2018 FINDINGS: Normal heart size and mediastinal contours. No acute infiltrate or edema. No effusion or pneumothorax. Remote and healed left ninth rib fracture. No acute osseous findings. IMPRESSION: Negative chest.  Electronically Signed   By: Monte Fantasia M.D.   On: 08/13/2018 16:13   Ct Head Wo Contrast  Result Date: 08/13/2018 CLINICAL DATA:  Head injury after syncope. Positive loss of consciousness. EXAM: CT HEAD WITHOUT CONTRAST CT CERVICAL SPINE WITHOUT CONTRAST TECHNIQUE: Multidetector CT imaging of the head and cervical spine was performed following the standard protocol without intravenous contrast. Multiplanar CT image reconstructions of the cervical spine were also generated. COMPARISON:  CT scan of February 10, 2017. FINDINGS: CT HEAD FINDINGS Brain: Minimal chronic ischemic white matter disease is noted. No mass effect or midline shift is noted. Ventricular size is within normal limits. There is no evidence of mass lesion, hemorrhage or acute infarction. Vascular: No hyperdense vessel or unexpected calcification. Skull: Normal. Negative for fracture or focal lesion. Sinuses/Orbits: Mild bilateral ethmoid sinusitis is noted. Other: Small posterior scalp hematoma is noted. CT CERVICAL SPINE FINDINGS Alignment: Normal. Skull base and vertebrae: No acute fracture. No primary bone lesion or focal pathologic process. Soft tissues and spinal canal: No prevertebral fluid or swelling. No visible canal hematoma. Disc levels:  Normal. Upper chest: Negative. Other: Degenerative changes are seen involving the posterior facet joints on the right side. IMPRESSION: Mild chronic ischemic white matter disease. Small posterior scalp hematoma. No acute intracranial abnormality seen. Mild degenerative changes as described above. No acute abnormality seen in the cervical spine. Electronically Signed   By: Marijo Conception, M.D.   On: 08/13/2018 16:07   Ct Cervical Spine Wo Contrast  Result Date: 08/13/2018 CLINICAL DATA:  Head injury after syncope. Positive loss of consciousness. EXAM: CT HEAD WITHOUT CONTRAST CT CERVICAL SPINE WITHOUT CONTRAST TECHNIQUE: Multidetector CT imaging of the head and cervical spine was performed  following the standard protocol without intravenous contrast. Multiplanar CT image reconstructions of the cervical spine were also generated. COMPARISON:  CT scan of February 10, 2017. FINDINGS: CT HEAD FINDINGS Brain: Minimal chronic ischemic white matter disease is noted. No mass effect or midline shift is noted. Ventricular size is within normal limits. There is no evidence of mass lesion, hemorrhage or acute infarction. Vascular: No hyperdense vessel or unexpected calcification. Skull: Normal. Negative for fracture or focal lesion. Sinuses/Orbits: Mild bilateral ethmoid sinusitis is noted. Other: Small posterior scalp hematoma is noted. CT CERVICAL SPINE FINDINGS Alignment: Normal. Skull base and vertebrae: No acute fracture. No primary bone lesion or focal pathologic process. Soft tissues  and spinal canal: No prevertebral fluid or swelling. No visible canal hematoma. Disc levels:  Normal. Upper chest: Negative. Other: Degenerative changes are seen involving the posterior facet joints on the right side. IMPRESSION: Mild chronic ischemic white matter disease. Small posterior scalp hematoma. No acute intracranial abnormality seen. Mild degenerative changes as described above. No acute abnormality seen in the cervical spine. Electronically Signed   By: Marijo Conception, M.D.   On: 08/13/2018 16:07    Procedures Procedures (including critical care time)  Medications Ordered in ED Medications  sodium chloride 0.9 % bolus 1,000 mL (has no administration in time range)  Tdap (BOOSTRIX) injection 0.5 mL (has no administration in time range)     Initial Impression / Assessment and Plan / ED Course  I have reviewed the triage vital signs and the nursing notes.  Pertinent labs & imaging results that were available during my care of the patient were reviewed by me and considered in my medical decision making (see chart for details).  Clinical Course as of Aug 13 1853  Wed Aug 13, 2018  [place]  syncope rule places at low risk.   [MB]  8182 Reviewed prior visits, multiple comments about patient being noncompliant with his medications.   [MB]  9937 glucose elevated here but no gap and he is renal function is about baseline.   [MB]    Clinical Course User Index [MB] Hayden Rasmussen, MD   Patients family here and reviewed results and plan with them. They will work on getting him back on medication and followup in clinic.     Final Clinical Impressions(s) / ED Diagnoses   Final diagnoses:  Syncope and collapse  Contusion of scalp, initial encounter  Other specified diabetes mellitus with other specified complication, unspecified whether long term insulin use (HCC)  Chronic kidney disease, unspecified CKD stage    ED Discharge Orders         Ordered    ACCU-CHEK SOFTCLIX LANCETS lancets  3 times daily     08/13/18 1532    Blood Glucose Monitoring Suppl (ACCU-CHEK AVIVA PLUS) w/Device KIT  3 times daily after meals     08/13/18 1532    glipiZIDE (GLUCOTROL) 5 MG tablet  Daily before breakfast     08/13/18 1532    glucose blood (ACCU-CHEK AVIVA PLUS) test strip  3 times daily     08/13/18 1532           Hayden Rasmussen, MD 08/13/18 [phone]

## 2018-11-11 ENCOUNTER — Encounter (HOSPITAL_COMMUNITY): Payer: Self-pay | Admitting: Emergency Medicine

## 2018-11-11 ENCOUNTER — Inpatient Hospital Stay (HOSPITAL_COMMUNITY)
Admission: EM | Admit: 2018-11-11 | Discharge: 2018-11-13 | DRG: 312 | Disposition: A | Discharge status: Home / Self Care | Payer: Medicare Other | Attending: Internal Medicine | Admitting: Internal Medicine

## 2018-11-11 ENCOUNTER — Other Ambulatory Visit: Payer: Self-pay

## 2018-11-11 DIAGNOSIS — E1143 Type 2 diabetes mellitus with diabetic autonomic (poly)neuropathy: Secondary | ICD-10-CM | POA: Diagnosis present

## 2018-11-11 DIAGNOSIS — E0590 Thyrotoxicosis, unspecified without thyrotoxic crisis or storm: Secondary | ICD-10-CM

## 2018-11-11 DIAGNOSIS — G909 Disorder of the autonomic nervous system, unspecified: Secondary | ICD-10-CM | POA: Diagnosis present

## 2018-11-11 DIAGNOSIS — N189 Chronic kidney disease, unspecified: Secondary | ICD-10-CM

## 2018-11-11 DIAGNOSIS — L989 Disorder of the skin and subcutaneous tissue, unspecified: Secondary | ICD-10-CM | POA: Diagnosis not present

## 2018-11-11 DIAGNOSIS — Z87891 Personal history of nicotine dependence: Secondary | ICD-10-CM | POA: Diagnosis not present

## 2018-11-11 DIAGNOSIS — E1142 Type 2 diabetes mellitus with diabetic polyneuropathy: Secondary | ICD-10-CM | POA: Diagnosis present

## 2018-11-11 DIAGNOSIS — I951 Orthostatic hypotension: Secondary | ICD-10-CM | POA: Diagnosis not present

## 2018-11-11 DIAGNOSIS — I10 Essential (primary) hypertension: Secondary | ICD-10-CM | POA: Diagnosis present

## 2018-11-11 DIAGNOSIS — Z833 Family history of diabetes mellitus: Secondary | ICD-10-CM

## 2018-11-11 DIAGNOSIS — K529 Noninfective gastroenteritis and colitis, unspecified: Secondary | ICD-10-CM

## 2018-11-11 DIAGNOSIS — E1122 Type 2 diabetes mellitus with diabetic chronic kidney disease: Secondary | ICD-10-CM | POA: Diagnosis not present

## 2018-11-11 DIAGNOSIS — E1165 Type 2 diabetes mellitus with hyperglycemia: Secondary | ICD-10-CM | POA: Diagnosis not present

## 2018-11-11 DIAGNOSIS — Z9114 Patient's other noncompliance with medication regimen: Secondary | ICD-10-CM | POA: Diagnosis not present

## 2018-11-11 DIAGNOSIS — R197 Diarrhea, unspecified: Secondary | ICD-10-CM | POA: Diagnosis not present

## 2018-11-11 DIAGNOSIS — J439 Emphysema, unspecified: Secondary | ICD-10-CM

## 2018-11-11 DIAGNOSIS — R55 Syncope and collapse: Secondary | ICD-10-CM | POA: Diagnosis not present

## 2018-11-11 DIAGNOSIS — R42 Dizziness and giddiness: Secondary | ICD-10-CM | POA: Diagnosis not present

## 2018-11-11 DIAGNOSIS — I129 Hypertensive chronic kidney disease with stage 1 through stage 4 chronic kidney disease, or unspecified chronic kidney disease: Secondary | ICD-10-CM | POA: Diagnosis present

## 2018-11-11 DIAGNOSIS — E861 Hypovolemia: Secondary | ICD-10-CM | POA: Diagnosis present

## 2018-11-11 DIAGNOSIS — E860 Dehydration: Secondary | ICD-10-CM | POA: Diagnosis present

## 2018-11-11 LAB — BASIC METABOLIC PANEL
ANION GAP: 6 (ref 5–15)
BUN: 35 mg/dL — ABNORMAL HIGH (ref 8–23)
CO2: 20 mmol/L — ABNORMAL LOW (ref 22–32)
Calcium: 8.3 mg/dL — ABNORMAL LOW (ref 8.9–10.3)
Chloride: 111 mmol/L (ref 98–111)
Creatinine, Ser: 2.22 mg/dL — ABNORMAL HIGH (ref 0.61–1.24)
GFR calc Af Amer: 34 mL/min — ABNORMAL LOW (ref >60)
GFR calc non Af Amer: 30 mL/min — ABNORMAL LOW (ref >60)
Glucose, Bld: 373 mg/dL — ABNORMAL HIGH (ref 70–99)
Potassium: 4.8 mmol/L (ref 3.5–5.1)
Sodium: 137 mmol/L (ref 135–145)

## 2018-11-11 LAB — CBC WITH DIFFERENTIAL/PLATELET
Abs Immature Granulocytes: 0.01 10*3/uL (ref 0.00–0.07)
Basophils Absolute: 0 10*3/uL (ref 0.0–0.1)
Basophils Relative: 1 %
Eosinophils Absolute: 0.3 10*3/uL (ref 0.0–0.5)
Eosinophils Relative: 5 %
HCT: 35.3 % — ABNORMAL LOW (ref 39.0–52.0)
Hemoglobin: 11.7 g/dL — ABNORMAL LOW (ref 13.0–17.0)
Immature Granulocytes: 0 %
LYMPHS PCT: 27 %
Lymphs Abs: 1.4 10*3/uL (ref 0.7–4.0)
MCH: 30.2 pg (ref 26.0–34.0)
MCHC: 33.1 g/dL (ref 30.0–36.0)
MCV: 91 fL (ref 80.0–100.0)
Monocytes Absolute: 0.4 10*3/uL (ref 0.1–1.0)
Monocytes Relative: 8 %
Neutro Abs: 3.1 10*3/uL (ref 1.7–7.7)
Neutrophils Relative %: 59 %
Platelets: 164 10*3/uL (ref 150–400)
RBC: 3.88 MIL/uL — ABNORMAL LOW (ref 4.22–5.81)
RDW: 12.4 % (ref 11.5–15.5)
WBC: 5.2 10*3/uL (ref 4.0–10.5)
nRBC: 0 % (ref 0.0–0.2)

## 2018-11-11 LAB — CBG MONITORING, ED
GLUCOSE-CAPILLARY: 383 mg/dL — AB (ref 70–99)
Glucose-Capillary: 370 mg/dL — ABNORMAL HIGH (ref 70–99)

## 2018-11-11 LAB — URINALYSIS, ROUTINE W REFLEX MICROSCOPIC
Bilirubin Urine: NEGATIVE
Glucose, UA: >=500 mg/dL — AB
Ketones, ur: NEGATIVE mg/dL
Leukocytes, UA: NEGATIVE
Nitrite: NEGATIVE
Protein, ur: NEGATIVE mg/dL
Specific Gravity, Urine: 1.01 (ref 1.005–1.030)
pH: 6 (ref 5.0–8.0)

## 2018-11-11 LAB — COMPREHENSIVE METABOLIC PANEL
ALT: 41 U/L (ref 0–44)
AST: 30 U/L (ref 15–41)
Albumin: 3.7 g/dL (ref 3.5–5.0)
Alkaline Phosphatase: 102 U/L (ref 38–126)
Anion gap: 8 (ref 5–15)
BUN: 42 mg/dL — AB (ref 8–23)
CO2: 22 mmol/L (ref 22–32)
Calcium: 9.5 mg/dL (ref 8.9–10.3)
Chloride: 104 mmol/L (ref 98–111)
Creatinine, Ser: 2.68 mg/dL — ABNORMAL HIGH (ref 0.61–1.24)
GFR calc Af Amer: 27 mL/min — ABNORMAL LOW (ref >60)
GFR calc non Af Amer: 24 mL/min — ABNORMAL LOW (ref >60)
Glucose, Bld: 385 mg/dL — ABNORMAL HIGH (ref 70–99)
Potassium: 5 mmol/L (ref 3.5–5.1)
SODIUM: 134 mmol/L — AB (ref 135–145)
Total Bilirubin: 0.5 mg/dL (ref 0.3–1.2)
Total Protein: 8.6 g/dL — ABNORMAL HIGH (ref 6.5–8.1)

## 2018-11-11 LAB — SEDIMENTATION RATE: Sed Rate: 50 mm/hr — ABNORMAL HIGH (ref 0–16)

## 2018-11-11 LAB — MAGNESIUM: Magnesium: 2 mg/dL (ref 1.7–2.4)

## 2018-11-11 LAB — HEMOGLOBIN A1C
HEMOGLOBIN A1C: 12.7 % — AB (ref 4.8–5.6)
Mean Plasma Glucose: 317.79 mg/dL

## 2018-11-11 LAB — TSH: TSH: 0.939 u[IU]/mL (ref 0.350–4.500)

## 2018-11-11 LAB — GLUCOSE, CAPILLARY: Glucose-Capillary: 172 mg/dL — ABNORMAL HIGH (ref 70–99)

## 2018-11-11 MED ORDER — INSULIN ASPART 100 UNIT/ML ~~LOC~~ SOLN
0.0000 [IU] | Freq: Three times a day (TID) | SUBCUTANEOUS | Status: DC
  Administered 2018-11-11: 9 [IU] via SUBCUTANEOUS
  Administered 2018-11-12: 3 [IU] via SUBCUTANEOUS
  Administered 2018-11-12: 5 [IU] via SUBCUTANEOUS
  Administered 2018-11-12: 2 [IU] via SUBCUTANEOUS
  Administered 2018-11-13: 1 [IU] via SUBCUTANEOUS
  Administered 2018-11-13: 3 [IU] via SUBCUTANEOUS
  Administered 2018-11-13: 9 [IU] via SUBCUTANEOUS

## 2018-11-11 MED ORDER — HEPARIN SODIUM (PORCINE) 5000 UNIT/ML IJ SOLN
5000.0000 [IU] | Freq: Three times a day (TID) | INTRAMUSCULAR | Status: DC
  Administered 2018-11-11 – 2018-11-13 (×6): 5000 [IU] via SUBCUTANEOUS
  Filled 2018-11-11 (×3): qty 1

## 2018-11-11 MED ORDER — ONDANSETRON HCL 4 MG PO TABS: 4.0000 mg | ORAL_TABLET | Freq: Four times a day (QID) | ORAL | Status: DC | PRN

## 2018-11-11 MED ORDER — POTASSIUM CHLORIDE IN NACL 20-0.9 MEQ/L-% IV SOLN
INTRAVENOUS | Status: DC
  Administered 2018-11-11 – 2018-11-12 (×3): via INTRAVENOUS
  Filled 2018-11-11 (×3): qty 1000

## 2018-11-11 MED ORDER — SODIUM CHLORIDE 0.9 % IV BOLUS
500.0000 mL | Freq: Once | INTRAVENOUS | Status: AC
  Administered 2018-11-11: 500 mL via INTRAVENOUS

## 2018-11-11 MED ORDER — INSULIN ASPART 100 UNIT/ML ~~LOC~~ SOLN
0.0000 [IU] | Freq: Every day | SUBCUTANEOUS | Status: DC
  Administered 2018-11-12: 3 [IU] via SUBCUTANEOUS

## 2018-11-11 MED ORDER — ACETAMINOPHEN 650 MG RE SUPP: 650.0000 mg | Freq: Four times a day (QID) | RECTAL | Status: DC | PRN

## 2018-11-11 MED ORDER — ACETAMINOPHEN 325 MG PO TABS: 650.0000 mg | ORAL_TABLET | Freq: Four times a day (QID) | ORAL | Status: DC | PRN

## 2018-11-11 MED ORDER — ONDANSETRON HCL 4 MG/2ML IJ SOLN: 4.0000 mg | Freq: Four times a day (QID) | INTRAMUSCULAR | Status: DC | PRN

## 2018-11-11 NOTE — H&P (Signed)
Date: 11/11/2018               Patient Name:  Brian Owen MRN: [id number]  DOB: 01/09/1951 Age / Sex: 68 y.o., male   PCP: Alfonse Spruce, FNP         Medical Service: Internal Medicine Teaching Service         Attending Physician: Dr. Annia Belt, MD    First Contact: Dr. Annie Paras Pager: [phone]  Second Contact: Dr. Tarri Abernethy Pager: [phone]       After Hours (After 5p/  First Contact Pager: [phone]  weekends / holidays): Second Contact Pager: [phone]   Chief Complaint: Dizziness  History of Present Illness: Brian Owen is a 68 yo male with autoimmune diabetes mellitus, peripheral neuropathy, and hyperthyroidism who presented to the ED after a syncopal episode at home. He reports that this AM he went to drop his grandchildren off at school and felt lightheaded. He then went home where he syncopized and then came to the ED. This has been a recurrent issue for him dating back to 2017 and he has been evaluated multiple times at this hospital. He is concerned that he is so "unhealthy" and reports that he has been told by doctors in the past that he should never be alone in case he loses consciousness and injures himself.   In addition to this he has had recurrent episodes of watery diarrhea since 2017. He states that the diarrhea only occurs at night and is primarily water. Some of the stool does float on the water. He denies abdominal pain, cramping, or blood in his stools. He immigrated from [place] in the 1980s and has not travels recently. He has never been diagnosised with a parasite or worm infection. He denies exposure to TB and hepatitis. 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 He denies weight loss.   He also brings up the issue of multiple skin lesions on his left lower extremity. This start out  as blisters but subsequently erupt and scar over. The last time one of these lesions appeared was in 2017. He denies pain or itching in the currently but does tells [place] that when they start they feel like someone is burning him.   In addition to the information provided above he does has some lower extremity numbness and blurry vision. Otherwise he denies headaches, double vision, cough, fevers/chils, N/V, abdominal pain.   Upon arrival to the ED, patient was afebrile and hypertensive in the 891Q systolic. Orthostatic vitals were positive and his BP upon standing was in the 94H systolic despite receiving 2L of NS. Labs significant for Hb 11.7 (around baseline), glucose 373, BUN 35, Cr 2.68 (baseline 1.5), and elevated protein gap. UA with glucose > 500, small hemoglobin, no RBCs.  Meds:  Patient is prescribed glipizide but is not taking the medication.   Allergies: Allergies as of 11/11/2018  . (No Known Allergies)   Past Medical History:  Diagnosis Date  . Bullae 11/11/2015   legs/notes 11/11/2015  . Chronic kidney disease   . Heavy cigarette smoker   . Parasite infection 2016   "couldn't afford to have surgery done; just left it; lower legs"  . Uncontrolled type II diabetes mellitus ([place]) dx'd 2013   Past Surgical History:  Procedure Laterality Date  . NO  PAST SURGERIES     Family History  Problem Relation Age of Onset  . Diabetes Other   . Diabetes Other    Social History: He immigrated from [place] in the 1980s.  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 Denies the use of illicit substances or EtOH. Prior smoker and quit 4 years ago.   Review of Systems: A complete ROS was negative except as per HPI.   Physical Exam: Blood pressure (!) 160/90, pulse 74, temperature 97.9 F (36.6 C), temperature source Oral, resp. rate 15, SpO2 100 %.  Constitutional: Well-developed,  well-nourished, and in no distress.  Eyes: Pupils are equal, round, and reactive to light. Arcus senilis. EOM are normal.  Cardiovascular: Normal rate and regular rhythm. No murmurs, rubs, or gallops. Pulmonary/Chest: Effort normal. Clear to auscultation bilaterally. No wheezes, rales, or rhonchi. Abdominal: Bowel sounds present. Soft, non-distended, non-tender. No organomegaly.  Ext: No lower extremity edema. Skin: Warm and dry. Multiple scars on the left lower extremity.   EKG: personally reviewed my interpretation is sinus rhythm with normal axis. P wave morphology is normal. PR interval <200 msec. QRS morphology is normal with the exception of a RR' in lead III. QRS interval <120 msec. ST segment normal. Peaked T waves in V3 and V4.   Assessment & Plan by Problem: Active Problems:   Orthostatic hypotension   Brian Owen is a 68 yo male with autoimmune diabetes mellitus, peripheral neuropathy, and hyperthyroidism who presented to the ED after a syncopal episode. He has had dizziness, syncope, and diarrhea for the past two years.  Orthostatic Hypotension - Multiple episodes of dizziness and syncope since 2017. He has had chronic diarrhea since the same period. Has had workup for syncope on previous admissions. - Orthostasis may be 2/2 dehydration in the setting of diarrhea or it may represent a separate process including autonomic insufficiency, adrenal insufficiency, and hypovolemia in the setting of glucosuria. He has no history of seizures. DDx also includes amyloidosis given elevated protein gap and multiorgan involvement (nervous system, skin, gi system, endocrinopathies of diabetes and hyperthyroidism) or POEMS (patient also has peripheral neuropathy in the lower extremities).  - ECHO 12/2016 with EF of 02-77%, grade 1 diastolic dysfunction, no valvular disease. EKG on admission is NSR. - Cosyntropin stimulation test and AM cortisol levels normal in 2018 - He has been on Midodrine in the  past for hypotension, but has not been taking this medication recently  Plan - IVF  - Hepatitis B and C  - HIV  - Quantiferon Gold - ESR - Immunofixation electrophoresis and kappa/lambda light chains - Immunoglobulins  Diarrhea - No recent travel history or sick contacts. No history of parasitic illness. - 12/2017 admission for gastroenteritis with GI pathogen panel showing norovirus. C. Diff negative at that time. - CT abdomen with no acute findings 12/2017 Plan - C. Diff - Stool studies (calprotectin, GI panel, osmolality, O&P, pancreatic elastase, potassium, sodium) - Tissue transglutaminase   Autoimmune diabetes - 2017 labs consistent with autoimmune diabetes - Last A1c: 14.3 three months ago - Has been on insulin in the past. Patient most recently on glipizide only, however he has not been taking this medication as he ran out. - Hypoglycemic events are not likely the cause of his dizziness because he is not on any diabetes therapy Plan - A1c - SSI  Hyperthyroidism - Patient had low TSH and mildly  elevated T4 in 2017. TSH was normal in 2018. Plan - TSH  Leg Lesions  - Multiple atrophic scars on the left lower extremity. No active lesions since 2017. Active lesions start as a burning bullae and then expand.  - He has taken antibiotics for these in the past as they cultured MSSA positive - Prior skin bx in 2017 consistent with external trauma  FEN: NS + K fluids, Carb modified diet, replace electrolytes as needed  DVT ppx: SubQ heparin due to AKI Code status: FULL code  Dispo: Admit patient to Observation with expected length of stay less than 2 midnights.  Signed: Ina Homes, MD 11/11/2018, 5:04 PM  Pager: [phone]

## 2018-11-11 NOTE — ED Notes (Signed)
Urinal at bedside.  

## 2018-11-11 NOTE — ED Triage Notes (Signed)
Pt speaks [place] -- using interpretor machine for assessment.  Pt states that he has had diarrhea "for months" was in the hospital for same. States happens "all day" difficult to get answer even with interpretor--

## 2018-11-11 NOTE — ED Provider Notes (Signed)
[facility] EMERGENCY DEPARTMENT Provider Note   CSN: [id number] Arrival date & time: 11/11/18  8127     History   Chief Complaint Chief Complaint  Patient presents with  . Diarrhea  . Dizziness    HPI Brian Owen is a 68 y.o. male.  HPI History obtained through translator.  Has had several months of watery diarrhea.  No blood in the stool.  Denies any abdominal pain.  No nausea or vomiting.  No recent foreign travel.  Also states has had dizziness over that time period.  States he feels off balance especially when he stands.  No focal weakness or numbness. Past Medical History:  Diagnosis Date  . Bullae 11/11/2015   legs/notes 11/11/2015  . Chronic kidney disease   . Heavy cigarette smoker   . Parasite infection 2016   "couldn't afford to have surgery done; just left it; lower legs"  . Uncontrolled type II diabetes mellitus ([place]) dx'd 2013    Patient Active Problem List   Diagnosis Date Noted  . Orthostatic hypertension 11/12/2018  . Diabetic peripheral neuropathy associated with type 2 diabetes mellitus ([place])   . Chronic diarrhea   . Uncontrolled type 2 diabetes mellitus with hyperglycemia ([place])   . Gastroenteritis due to norovirus 01/12/2018  . AKI (acute kidney injury) ([place]) 01/12/2018  . Type II diabetes mellitus with renal manifestations ([place]) 01/11/2018  . Absent pedal pulses 09/27/2017  . Syncope 01/13/2017  . Postural dizziness with presyncope   . CKD (chronic kidney disease), stage III ([place]) 06/16/2016  . Underweight 01/17/2016  . Autonomic neuropathy due to diabetes ([place]) 11/29/2015  . Hyperglycemia 11/27/2015  . Hypotension 11/27/2015  . Vertigo 11/27/2015  . Orthostatic hypotension 11/27/2015  . SVT (supraventricular tachycardia) ([place]) 11/11/2015  . Diarrhea 08/17/2014  . Protein-calorie malnutrition, severe ([place]) 07/26/2014  . History of tobacco use 06/27/2014  . Edentulism, complete 06/27/2014  . Language barrier to communication  06/27/2014  . HTN (hypertension) 06/27/2014  . Hyponatremia 05/13/2014  . Type 2 diabetes mellitus with hyperglycemia, with long-term current use of insulin ([place]) 05/13/2014    Past Surgical History:  Procedure Laterality Date  . NO PAST SURGERIES          Home Medications    Prior to Admission medications   Medication Sig Start Date End Date Taking? Authorizing Provider  ACCU-CHEK SOFTCLIX LANCETS lancets 1 each by Other route 3 (three) times daily. 08/13/18   Hayden Rasmussen, MD  Blood Glucose Monitoring Suppl (ACCU-CHEK AVIVA PLUS) w/Device KIT 1 Device by Does not apply route 3 (three) times daily after meals. 08/13/18   Hayden Rasmussen, MD  glucose blood (ACCU-CHEK AVIVA PLUS) test strip 1 each by Other route 3 (three) times daily. 08/13/18   Hayden Rasmussen, MD  insulin glargine (LANTUS) 100 UNIT/ML injection Inject 0.06 mLs (6 Units total) into the skin at bedtime. 11/13/18   Dorrell, Andree Elk, MD  midodrine (PROAMATINE) 2.5 MG tablet Take 1 tablet (2.5 mg total) by mouth 3 (three) times daily with meals. 11/13/18   Dorrell, Andree Elk, MD    Family History Family History  Problem Relation Age of Onset  . Diabetes Other   . Diabetes Other     Social History Social History   Tobacco Use  . Smoking status: Former Smoker    Packs/day: 1.50    Years: 48.00    Pack years: 72.00    Types: Cigarettes    Last attempt to quit: 2015  Years since quitting: 5.0  . Smokeless tobacco: Never Used  Substance Use Topics  . Alcohol use: No  . Drug use: No     Allergies   Patient has no known allergies.   Review of Systems Review of Systems  Constitutional: Negative for chills and fever.  Respiratory: Negative for cough and shortness of breath.   Cardiovascular: Negative for chest pain.  Gastrointestinal: Positive for diarrhea. Negative for abdominal pain, blood in stool, constipation, nausea and vomiting.  Genitourinary: Negative for dysuria and flank pain.    Musculoskeletal: Negative for back pain, myalgias and neck pain.  Skin: Negative for rash and wound.  Neurological: Positive for dizziness and light-headedness. Negative for weakness, numbness and headaches.  All other systems reviewed and are negative.    Physical Exam Updated Vital Signs BP (!) 196/88   Pulse 88   Temp 98.3 F (36.8 C) (Oral)   Resp 16   Ht _0  (1.549 m)   Wt 50.9 kg   SpO2 100%   BMI 21.20 kg/m   Physical Exam Vitals signs and nursing note reviewed.  Constitutional:      General: He is not in acute distress.    Appearance: Normal appearance. He is well-developed. He is not ill-appearing.  HENT:     Head: Normocephalic and atraumatic.     Nose: Nose normal.     Mouth/Throat:     Mouth: Mucous membranes are moist.     Pharynx: No oropharyngeal exudate or posterior oropharyngeal erythema.  Eyes:     Extraocular Movements: Extraocular movements intact.     Pupils: Pupils are equal, round, and reactive to light.     Comments: No nystagmus  Neck:     Musculoskeletal: Normal range of motion and neck supple. No neck rigidity or muscular tenderness.  Cardiovascular:     Rate and Rhythm: Normal rate and regular rhythm.     Heart sounds: No murmur. No friction rub. No gallop.   Pulmonary:     Effort: Pulmonary effort is normal. No respiratory distress.     Breath sounds: Normal breath sounds. No stridor. No wheezing, rhonchi or rales.  Abdominal:     General: Bowel sounds are normal.     Palpations: Abdomen is soft. There is no mass.     Tenderness: There is no abdominal tenderness. There is no right CVA tenderness, left CVA tenderness, guarding or rebound.     Hernia: No hernia is present.  Musculoskeletal: Normal range of motion.        General: No swelling, tenderness, deformity or signs of injury.     Right lower leg: No edema.     Left lower leg: No edema.  Lymphadenopathy:     Cervical: No cervical adenopathy.  Skin:    General: Skin is warm  and dry.     Capillary Refill: Capillary refill takes less than 2 seconds.     Findings: No erythema or rash.  Neurological:     General: No focal deficit present.     Mental Status: He is alert and oriented to person, place, and time.  Psychiatric:        Mood and Affect: Mood normal.        Behavior: Behavior normal.      ED Treatments / Results  Labs (all labs ordered are listed, but only abnormal results are displayed) Labs Reviewed  CBC WITH DIFFERENTIAL/PLATELET - Abnormal; Notable for the following components:      Result Value  RBC 3.88 (*)    Hemoglobin 11.7 (*)    HCT 35.3 (*)    All other components within normal limits  COMPREHENSIVE METABOLIC PANEL - Abnormal; Notable for the following components:   Sodium 134 (*)    Glucose, Bld 385 (*)    BUN 42 (*)    Creatinine, Ser 2.68 (*)    Total Protein 8.6 (*)    GFR calc non Af Amer 24 (*)    GFR calc Af Amer 27 (*)    All other components within normal limits  URINALYSIS, ROUTINE W REFLEX MICROSCOPIC - Abnormal; Notable for the following components:   Color, Urine STRAW (*)    Glucose, UA >=500 (*)    Hgb urine dipstick SMALL (*)    Bacteria, UA RARE (*)    All other components within normal limits  BASIC METABOLIC PANEL - Abnormal; Notable for the following components:   CO2 20 (*)    Glucose, Bld 373 (*)    BUN 35 (*)    Creatinine, Ser 2.22 (*)    Calcium 8.3 (*)    GFR calc non Af Amer 30 (*)    GFR calc Af Amer 34 (*)    All other components within normal limits  SEDIMENTATION RATE - Abnormal; Notable for the following components:   Sed Rate 50 (*)    All other components within normal limits  KAPPA/LAMBDA LIGHT CHAINS - Abnormal; Notable for the following components:   Kappa free light chain 126.1 (*)    Lamda free light chains 54.4 (*)    Kappa, lamda light chain ratio 2.32 (*)    All other components within normal limits  IMMUNOFIXATION ELECTROPHORESIS - Abnormal; Notable for the following  components:   IgA 631 (*)    All other components within normal limits  HEMOGLOBIN A1C - Abnormal; Notable for the following components:   Hgb A1c MFr Bld 12.7 (*)    All other components within normal limits  IMMUNOGLOBULINS A/E/G/M, SERUM - Abnormal; Notable for the following components:   IgA 603 (*)    IgE (Immunoglobulin E), Serum 793 (*)    All other components within normal limits  GLUCOSE, CAPILLARY - Abnormal; Notable for the following components:   Glucose-Capillary 172 (*)    All other components within normal limits  GLUCOSE, CAPILLARY - Abnormal; Notable for the following components:   Glucose-Capillary 187 (*)    All other components within normal limits  ANA W/REFLEX IF POSITIVE - Abnormal; Notable for the following components:   Anti Nuclear Antibody(ANA) Positive (*)    All other components within normal limits  GLUCOSE, CAPILLARY - Abnormal; Notable for the following components:   Glucose-Capillary 282 (*)    All other components within normal limits  GLUCOSE, CAPILLARY - Abnormal; Notable for the following components:   Glucose-Capillary 218 (*)    All other components within normal limits  GLUCOSE, CAPILLARY - Abnormal; Notable for the following components:   Glucose-Capillary 286 (*)    All other components within normal limits  GLUCOSE, CAPILLARY - Abnormal; Notable for the following components:   Glucose-Capillary 150 (*)    All other components within normal limits  GLUCOSE, CAPILLARY - Abnormal; Notable for the following components:   Glucose-Capillary 230 (*)    All other components within normal limits  ENA+DNA/DS+ANTICH+CENTRO+JO.Marland KitchenMarland Kitchen - Abnormal; Notable for the following components:   SSA (Ro) (ENA) Antibody, IgG 6.4 (*)    All other components within normal limits  GLUCOSE, CAPILLARY -  Abnormal; Notable for the following components:   Glucose-Capillary 351 (*)    All other components within normal limits  CBG MONITORING, ED - Abnormal; Notable for  the following components:   Glucose-Capillary 370 (*)    All other components within normal limits  CBG MONITORING, ED - Abnormal; Notable for the following components:   Glucose-Capillary 383 (*)    All other components within normal limits  MAGNESIUM  HIV ANTIBODY (ROUTINE TESTING W REFLEX)  PROTEIN ELECTROPHORESIS, SERUM  HEPATITIS C ANTIBODY  TSH  TISSUE TRANSGLUTAMINASE, IGA  HEPATITIS B SURFACE ANTIBODY, QUANTITATIVE  HEPATITIS B SURFACE ANTIGEN  QUANTIFERON-TB GOLD PLUS  ACTH STIMULATION, 3 TIME POINTS  ANTINUCLEAR ANTIBODIES, IFA  QUANTIFERON-TB GOLD PLUS (RQFGPL)    EKG EKG Interpretation  Date/Time:  Tuesday November 11 2018 09:40:32 EST Ventricular Rate:  79 PR Interval:    QRS Duration: 94 QT Interval:  370 QTC Calculation: 425 R Axis:   72 Text Interpretation:  Sinus rhythm When compared with ECG of 08/13/2018, QT has shortened Confirmed by Delora Fuel ([id number]) on 11/12/2018 11:33:10 AM   Radiology No results found.  Procedures Procedures (including critical care time)  Medications Ordered in ED Medications  sodium chloride 0.9 % bolus 500 mL (0 mLs Intravenous Stopped 11/11/18 1053)  sodium chloride 0.9 % bolus 500 mL (0 mLs Intravenous Stopped 11/11/18 1052)  sodium chloride 0.9 % bolus 500 mL (0 mLs Intravenous Stopped 11/11/18 1314)  sodium chloride 0.9 % bolus 500 mL (0 mLs Intravenous Stopped 11/11/18 1411)  cosyntropin (CORTROSYN) injection 0.25 mg (0.25 mg Intravenous Given 11/13/18 0939)     Initial Impression / Assessment and Plan / ED Course  I have reviewed the triage vital signs and the nursing notes.  Pertinent labs & imaging results that were available during my care of the patient were reviewed by me and considered in my medical decision making (see chart for details).     Discussed with internal medicine teaching service who will evaluate patient in the emergency department.  Final Clinical Impressions(s) / ED Diagnoses   Final  diagnoses:  Orthostatic hypotension    ED Discharge Orders         Ordered    insulin starter kit- pen needles MISC   Once     11/13/18 1150    Increase activity slowly     11/13/18 1150    Discharge instructions    Comments:  It was a pleasure taking care of you in the hospital, Mr. Covault!  1. You were hospitalized for orthostatic hypotension, which is when your blood pressure drops when you stand up.   2. Please start taking the medication called Midodrine. Take 2.60m three times a day with your meals. The most important dose is the morning dose.  3. There are other ways to prevent orthostatic hypotension as well. We have provided you with an abdominal binder and compression stockings to wear. You should also eat a high salt diet.  4. Your diabetes is currently uncontrolled. Please start taking Lantus 6 units at night. You should also measure your blood sugar every morning. Write down a log of your morning blood sugars and take that with you to your next doctor appointment.  5. Please follow up in the internal medicine clinic on the ground floor of the hospital on 1/23 at 10:45 am. You can establish care with our group of providers and we will follow your diabetes. I left a voice mail on your home phone with the information  of this appointment through a [place] interpreter.  Feel free to call our clinic at [phone] if you have any questions.  Thanks, Dr. Annie Paras   11/13/18 1150    midodrine (PROAMATINE) 2.5 MG tablet  3 times daily with meals     11/13/18 1150    insulin glargine (LANTUS) 100 UNIT/ML injection  Daily at bedtime     11/13/18 1150           Julianne Rice, MD 11/20/18 1600

## 2018-11-11 NOTE — ED Notes (Signed)
Attempted report 

## 2018-11-12 DIAGNOSIS — L989 Disorder of the skin and subcutaneous tissue, unspecified: Secondary | ICD-10-CM | POA: Diagnosis present

## 2018-11-12 DIAGNOSIS — Z87891 Personal history of nicotine dependence: Secondary | ICD-10-CM | POA: Diagnosis not present

## 2018-11-12 DIAGNOSIS — G909 Disorder of the autonomic nervous system, unspecified: Secondary | ICD-10-CM | POA: Diagnosis present

## 2018-11-12 DIAGNOSIS — E2740 Unspecified adrenocortical insufficiency: Secondary | ICD-10-CM | POA: Diagnosis not present

## 2018-11-12 DIAGNOSIS — R55 Syncope and collapse: Secondary | ICD-10-CM | POA: Diagnosis not present

## 2018-11-12 DIAGNOSIS — Z8639 Personal history of other endocrine, nutritional and metabolic disease: Secondary | ICD-10-CM | POA: Diagnosis not present

## 2018-11-12 DIAGNOSIS — Z833 Family history of diabetes mellitus: Secondary | ICD-10-CM | POA: Diagnosis not present

## 2018-11-12 DIAGNOSIS — N189 Chronic kidney disease, unspecified: Secondary | ICD-10-CM | POA: Diagnosis present

## 2018-11-12 DIAGNOSIS — E860 Dehydration: Secondary | ICD-10-CM | POA: Diagnosis present

## 2018-11-12 DIAGNOSIS — I951 Orthostatic hypotension: Secondary | ICD-10-CM | POA: Diagnosis present

## 2018-11-12 DIAGNOSIS — E0590 Thyrotoxicosis, unspecified without thyrotoxic crisis or storm: Secondary | ICD-10-CM | POA: Diagnosis present

## 2018-11-12 DIAGNOSIS — E1142 Type 2 diabetes mellitus with diabetic polyneuropathy: Secondary | ICD-10-CM

## 2018-11-12 DIAGNOSIS — K529 Noninfective gastroenteritis and colitis, unspecified: Secondary | ICD-10-CM | POA: Diagnosis present

## 2018-11-12 DIAGNOSIS — E118 Type 2 diabetes mellitus with unspecified complications: Secondary | ICD-10-CM

## 2018-11-12 DIAGNOSIS — E1143 Type 2 diabetes mellitus with diabetic autonomic (poly)neuropathy: Secondary | ICD-10-CM | POA: Diagnosis present

## 2018-11-12 DIAGNOSIS — I10 Essential (primary) hypertension: Secondary | ICD-10-CM | POA: Diagnosis present

## 2018-11-12 DIAGNOSIS — E1165 Type 2 diabetes mellitus with hyperglycemia: Secondary | ICD-10-CM | POA: Diagnosis present

## 2018-11-12 DIAGNOSIS — I129 Hypertensive chronic kidney disease with stage 1 through stage 4 chronic kidney disease, or unspecified chronic kidney disease: Secondary | ICD-10-CM | POA: Diagnosis present

## 2018-11-12 DIAGNOSIS — E861 Hypovolemia: Secondary | ICD-10-CM | POA: Diagnosis present

## 2018-11-12 LAB — GLUCOSE, CAPILLARY
Glucose-Capillary: 187 mg/dL — ABNORMAL HIGH (ref 70–99)
Glucose-Capillary: 282 mg/dL — ABNORMAL HIGH (ref 70–99)
Glucose-Capillary: 218 mg/dL — ABNORMAL HIGH (ref 70–99)
Glucose-Capillary: 286 mg/dL — ABNORMAL HIGH (ref 70–99)

## 2018-11-12 LAB — HIV ANTIBODY (ROUTINE TESTING W REFLEX): HIV Screen 4th Generation wRfx: NONREACTIVE

## 2018-11-12 MED ORDER — INSULIN GLARGINE 100 UNIT/ML ~~LOC~~ SOLN
6.0000 [IU] | Freq: Every day | SUBCUTANEOUS | Status: DC
  Administered 2018-11-12: 6 [IU] via SUBCUTANEOUS
  Filled 2018-11-12 (×2): qty 0.06

## 2018-11-12 MED ORDER — INSULIN STARTER KIT- PEN NEEDLES (ENGLISH)
1.0000 | Freq: Once | Status: DC
  Filled 2018-11-12: qty 1

## 2018-11-12 MED ORDER — COSYNTROPIN 0.25 MG IJ SOLR
0.2500 mg | Freq: Once | INTRAMUSCULAR | Status: AC
  Administered 2018-11-13: 0.25 mg via INTRAVENOUS
  Filled 2018-11-12: qty 0.25

## 2018-11-12 NOTE — Progress Notes (Signed)
New Admission Note:  Arrival Method: Via stretcher from ED Mental Orientation: Alert & Oriented x4 Telemetry: n/a Assessment: Completed Skin: Refer to flowsheet IV: Left Upper Arm Pain: 0/10 Safety Measures: Safety Fall Prevention Plan discussed with patient. Admission: Completed 5 [place] Orientation: Patient has been orientated to the room, unit and the staff.  Orders have been reviewed and are being implemented. Will continue to monitor the patient. Call light has been placed within reach and bed alarm has been activated.   Vassie Moselle, RN  Phone Number: [phone]

## 2018-11-12 NOTE — Progress Notes (Signed)
Pt refused to administer insulin himself and says wife adeministers the insulin at home.

## 2018-11-12 NOTE — Progress Notes (Addendum)
Inpatient Diabetes Program Recommendations  AACE/ADA: New Consensus Statement on Inpatient Glycemic Control (2015)  Target Ranges:  Prepandial:   less than 140 mg/dL      Peak postprandial:   less than 180 mg/dL (1-2 hours)      Critically ill patients:  140 - 180 mg/dL   Lab Results  Component Value Date   GLUCAP 187 (H) 11/12/2018   HGBA1C 12.7 (H) 11/11/2018    Review of Glycemic ControlResults for [facility] (MRN [id number]) as of 11/12/2018 10:37  Ref. Range 11/11/2018 09:18 11/11/2018 17:30 11/11/2018 21:48 11/12/2018 07:48  Glucose-Capillary Latest Ref Range: 70 - 99 mg/dL 370 (H) 383 (H) 172 (H) 187 (H)    Diabetes history: "Autoimmune diabetes" per H&P Outpatient Diabetes medications:  Glucotrol 5 mg q AM (patient not taking per history) Current orders for Inpatient glycemic control:  Novolog sensitive tid with meals and HS  Inpatient Diabetes Program Recommendations:    Blood sugars improving with insulin.  Note that patient was on insulin in the past, however only ordered oral medications most recently. Based on A1C results and history of "autoimmune" diabetes, may consider adding low dose basal insulin in the hospital and at home. Consider adding Lantus 6 units daily.  Unsure of patients/families ability to administer insulin and check blood sugars at home.  Will follow.   Thanks,  Adah Perl, RN, BC-ADM Inpatient Diabetes Coordinator Pager [phone] (8a-5p)  Addendum:  [phone]- MD called and states that patient will be d/c'd home on insulin.  Review of chart indicates that patient was prescribed Glucotrol on several occasions but stopped taking it.  Asked RN to begin insulin teaching with patient/family. Unsure if patient will be able to do this.  Will follow up on 1/16 with patient/family to further assess ability to administer insulin, etc. Ordered insulin starter kit.

## 2018-11-12 NOTE — Progress Notes (Signed)
Medicine attending: I examined this patient today together with resident physician Dr. Vilma Prader and I concur with her evaluation and management plan. Stable overnight.  No bowel movements.  Trend for improvement of BUN and creatinine with hydration. Not surprisingly, hemoglobin A1c elevated at 12.7%. Sugars better controlled with sliding scale regular insulin.  We will go ahead and add Lantus insulin starting today. HIV nonreactive. Significant elevation of sed rate at 50 mm consistent with an acute or chronic inflammation or collagen vascular disorder.  Multiple tests on blood and stool still pending. Order placed to repeat ACTH stimulation at my request at 7:30 AM this morning.  We were then called by the lab letter to say that the order was not put incorrectly and we missed the optimal time for testing so it has to be rescheduled for tomorrow. Impression: 1.  Orthostatic hypotension/dizziness Suspect autonomic insufficiency due to longstanding poorly controlled diabetes.  Normotensive when he is sitting or supine. Outside possibility of amyloidosis.  We our screening for AL amyloidosis. Consider reinstitution of midodrine.  Elastic stockings.  Cane or walker.  Physical therapy evaluation. 2.  Chronic, low-grade, diarrhea occurring almost exclusively at night. Evaluation to rule out malabsorption or chronic parasite infection in progress. 3.  Chronic bullous skin lesions which evolved to scar formation.  Staphylococcus isolated previously.  No active lesions for 2 years. 4.  Type 2 diabetes with high titer anti-insulin antibodies demonstrated in 2017. 5.  Disposition: We plan to adopt him in our general medicine clinic so that he gets the proper education and treatment of his diabetes and autonomic insufficiency.

## 2018-11-12 NOTE — Progress Notes (Signed)
PT Cancellation Note  Patient Details Name: Brian Owen MRN: [id number] DOB: [birth year]   Cancelled Treatment:    Reason Eval/Treat Not Completed: Medical issues which prohibited therapy. Spoke with RN who requested to hold therapy at this time due to patient having orthostatic hypotension. Will follow up as schedule allows.   Erick Blinks, SPT  Erick Blinks 11/12/2018, 3:54 PM

## 2018-11-12 NOTE — Progress Notes (Addendum)
   Subjective: No overnight events. Mr. Leffler reports he has not had any BMs since coming to the hospital. Therefore, no stool samples have been collected yet. He also reports that his dizziness has resolved, but he hasn't gotten out of bed yet because the bed alarm keeps blaring when he tries. All of his questions were answered.  Objective:  Vital signs in last 24 hours: Vitals:   11/11/18 1714 11/11/18 1851 11/11/18 2127 11/12/18 0512  BP: (!) 185/101 117/67 121/69 (!) 142/92  Pulse: 89 84 84 76  Resp: '16 18 18 18  '$ Temp:   97.8 F (36.6 C) 98.4 F (36.9 C)  TempSrc:   Oral Oral  SpO2: 100% 100% 100% 99%  Weight:   49.6 kg    Gen: sitting comfortably in bed, no distress Pulm: CTAB, normal effort CV: RRR, no murmurs Abd: Bowel sounds present, soft, non-distended, non-tender Neuro: No sensation to light touch up to the right knee and the mid left calf Skin: Multiple atrophic scars with ring of hyperpigmentation on his left leg  Assessment/Plan:  Active Problems:   Orthostatic hypotension  Mr. Fandrich is a 68 yo male with uncontrolled autoimmune diabetes mellitus and peripheral neuropathy who presented to the ED after a syncopal episode and documented orthostatic hypotension. He has had dizziness, syncope, and diarrhea for the past two years.  Orthostatic hypotension - DDx: autonomic dysfunction 2/2 uncontrolled diabetes, adrenal insufficiency, hypovolemia 2/2 glucosuria, hypovolemia 2/2 chronic diarrhea, amyloidosis, POEMS - Dizziness has improved, but orthostatics are still positive today. 154/91 laying down --> 117/86 sitting up --> 91/66 standing up --> 85/99 after 3 minutes standing. Patient got dizzy with standing.  - ESR elevated to 50  - Patient has been on midodrine in the past. Sitting, his BP is mildly hypertensive in the 530Y systolic. He may require resuming midodrine.  Plan - Continue IVF - Repeat orthostatics today - ACTH stim test tomorrow morning. - F/u hep B, hep  C, HIV, quantiferon gold, IFE, kappa/lambda free light chains, immunoglobulins, and ANA - PT eval and treat  Diarrhea  - No BMs since admission. Stool studies still need to be collected. Plan  - F/u stool studies and tissue transglutaminase  Uncontrolled autoimmune diabetes - A1c 12.7  - Sugars are still above goal with SSI Plan - Start lantus 6u qhs - SSI - Diabetes education with nursing staff and diabetes coordinator  History of hyperthyroidism - TSH wnl  Dispo: Anticipated discharge tomorrow  Lashaun Poch, Andree Elk, MD 11/12/2018, 6:48 AM Pager: [phone]

## 2018-11-13 ENCOUNTER — Other Ambulatory Visit (HOSPITAL_COMMUNITY): Payer: Self-pay | Admitting: Internal Medicine

## 2018-11-13 LAB — ENA+DNA/DS+ANTICH+CENTRO+JO...
Anti JO-1: <0.2 AI (ref 0.0–0.9)
Centromere Ab Screen: <0.2 AI (ref 0.0–0.9)
Chromatin Ab SerPl-aCnc: <0.2 AI (ref 0.0–0.9)
ENA SM Ab Ser-aCnc: <0.2 AI (ref 0.0–0.9)
Ribonucleic Protein: <0.2 AI (ref 0.0–0.9)
SSA (Ro) (ENA) Antibody, IgG: 6.4 AI — ABNORMAL HIGH (ref 0.0–0.9)
SSB (La) (ENA) Antibody, IgG: <0.2 AI (ref 0.0–0.9)
Scleroderma (Scl-70) (ENA) Antibody, IgG: 0.2 AI (ref 0.0–0.9)
ds DNA Ab: 1 IU/mL (ref 0–9)

## 2018-11-13 LAB — QUANTIFERON-TB GOLD PLUS: QUANTIFERON-TB GOLD PLUS: NEGATIVE

## 2018-11-13 LAB — PROTEIN ELECTROPHORESIS, SERUM
A/G Ratio: 1.1 (ref 0.7–1.7)
Albumin ELP: 3.6 g/dL (ref 2.9–4.4)
Alpha-1-Globulin: 0.2 g/dL (ref 0.0–0.4)
Alpha-2-Globulin: 0.7 g/dL (ref 0.4–1.0)
BETA GLOBULIN: 1.1 g/dL (ref 0.7–1.3)
Gamma Globulin: 1.3 g/dL (ref 0.4–1.8)
Globulin, Total: 3.3 g/dL (ref 2.2–3.9)
Total Protein ELP: 6.9 g/dL (ref 6.0–8.5)

## 2018-11-13 LAB — HEPATITIS C ANTIBODY: HCV Ab: <0.1 s/co ratio (ref 0.0–0.9)

## 2018-11-13 LAB — QUANTIFERON-TB GOLD PLUS (RQFGPL)
QUANTIFERON TB1 AG VALUE: 0.03 [IU]/mL
QuantiFERON Mitogen Value: >10 IU/mL
QuantiFERON Nil Value: 0.03 IU/mL
QuantiFERON TB2 Ag Value: 0.03 IU/mL

## 2018-11-13 LAB — HEPATITIS B SURFACE ANTIGEN: Hepatitis B Surface Ag: NEGATIVE

## 2018-11-13 LAB — GLUCOSE, CAPILLARY
Glucose-Capillary: 150 mg/dL — ABNORMAL HIGH (ref 70–99)
Glucose-Capillary: 230 mg/dL — ABNORMAL HIGH (ref 70–99)
Glucose-Capillary: 351 mg/dL — ABNORMAL HIGH (ref 70–99)

## 2018-11-13 LAB — HEPATITIS B SURFACE ANTIBODY, QUANTITATIVE: HEPATITIS B-POST: 39.3 m[IU]/mL

## 2018-11-13 LAB — ANA W/REFLEX IF POSITIVE: Anti Nuclear Antibody(ANA): POSITIVE — AB

## 2018-11-13 LAB — KAPPA/LAMBDA LIGHT CHAINS
Kappa free light chain: 126.1 mg/L — ABNORMAL HIGH (ref 3.3–19.4)
Kappa, lambda light chain ratio: 2.32 — ABNORMAL HIGH (ref 0.26–1.65)
Lambda free light chains: 54.4 mg/L — ABNORMAL HIGH (ref 5.7–26.3)

## 2018-11-13 LAB — ACTH STIMULATION, 3 TIME POINTS
Cortisol, 30 Min: 19.6 ug/dL
Cortisol, 60 Min: 20.7 ug/dL
Cortisol, Base: 12.3 ug/dL

## 2018-11-13 LAB — TISSUE TRANSGLUTAMINASE, IGA: Tissue Transglutaminase Ab, IgA: <2 U/mL (ref 0–3)

## 2018-11-13 MED ORDER — MIDODRINE HCL 2.5 MG PO TABS: 2.5000 mg | ORAL_TABLET | Freq: Three times a day (TID) | ORAL | 0 refills | Status: DC

## 2018-11-13 MED ORDER — INSULIN GLARGINE 100 UNIT/ML ~~LOC~~ SOLN: 6.0000 [IU] | Freq: Every day | SUBCUTANEOUS | 11 refills | Status: DC

## 2018-11-13 MED ORDER — INSULIN STARTER KIT- PEN NEEDLES (ENGLISH): 1.0000 | Freq: Once | 0 refills | Status: AC

## 2018-11-13 MED ORDER — MIDODRINE HCL 5 MG PO TABS
5.0000 mg | ORAL_TABLET | Freq: Three times a day (TID) | ORAL | Status: DC
  Administered 2018-11-13: 5 mg via ORAL
  Filled 2018-11-13: qty 1

## 2018-11-13 MED ORDER — MIDODRINE HCL 5 MG PO TABS
2.5000 mg | ORAL_TABLET | Freq: Three times a day (TID) | ORAL | Status: DC
  Administered 2018-11-13 (×2): 2.5 mg via ORAL
  Filled 2018-11-13 (×2): qty 1

## 2018-11-13 NOTE — Discharge Summary (Signed)
Name: Brian Owen MRN: [id number] DOB: [birth year] 68 y.o. PCP: Alfonse Spruce, FNP  Date of Admission: 11/11/2018  8:54 AM Date of Discharge: 11/13/2018 Attending Physician: Dr. Rebeca Alert  Discharge Diagnosis: 1. Orthostatic hypotension 2. Uncontrolled autoimmune diabetes 3. Chronic diarrhea 4. Lower extremity wounds  Discharge Medications: Allergies as of 11/13/2018   No Known Allergies     Medication List    STOP taking these medications   glipiZIDE 5 MG tablet Commonly known as:  GLUCOTROL   ondansetron 4 MG disintegrating tablet Commonly known as:  ZOFRAN ODT     TAKE these medications   ACCU-CHEK AVIVA PLUS w/Device Kit 1 Device by Does not apply route 3 (three) times daily after meals.   ACCU-CHEK SOFTCLIX LANCETS lancets 1 each by Other route 3 (three) times daily.   glucose blood test strip Commonly known as:  ACCU-CHEK AVIVA PLUS 1 each by Other route 3 (three) times daily.   insulin glargine 100 UNIT/ML injection Commonly known as:  LANTUS Inject 0.06 mLs (6 Units total) into the skin at bedtime.   insulin starter kit- pen needles Misc 1 kit by Other route once for 1 dose.   midodrine 2.5 MG tablet Commonly known as:  PROAMATINE Take 1 tablet (2.5 mg total) by mouth 3 (three) times daily with meals.       Disposition and follow-up:   BrianBrian Owen was discharged from [facility] in Good condition.  At the hospital follow up visit please address:  1. Orthostatic hypotension - Presented with postural dizziness and syncope  - Has had multiple admissions and extensive workup for this in the past - Most likely diagnosis: autonomic dysfunction and hypovolemia 2/2 uncontrolled diabetes - Patient was started on midodrine 2.42m TID - Non-pharmacologic recommendations: abdominal binder, compression stockings, high salt diet - Please ensure that patient is taking the midodrine at f/u. Repeat orthostatic vital signs. - His midodrine was  discontinued in the past due to high blood pressures. Orthostatics should always be performed on him and only standing blood pressures should be treated.  2. Uncontrolled autoimmune diabetes - Hb A1c 12.7. Not on any medications at the time of admission. Has been on insulin and glipizide in the past. - Poor health literacy. Wife administers his insulin to him.  - Started Lantus 6u qhs. Advised patient to record morning blood sugars and bring log to next visit. - Please f/u patient's blood sugars and titrate insulin as appropriate.  3. Chronic diarrhea - Two year history of diarrhea. Normal electrolytes. - Resolved during admission. - Patient unable to provide stool sample during admission. - If patient's diarrhea has returned, consider stool studies.  4. Positive ANA and left lower extremity lesions - ANA done because of discoid lupus appearing lower extremity lesions plus elevated ESR. - Reflex panel negative for dsDNA antibody, but positive for SSA antibody - Please f/u ANA titer  5.  Labs / imaging needed at time of follow-up: glucose, orthostatic vitals, A1c in 3 months  6.  Pending labs/ test needing follow-up: ANA titer, quantiferon gold, kappa/lambda light chains, SPEP, IFE, immunoglobulins, tissue transglutaminase   [facility]  Hospital Course by problem list: 1. Orthostatic hypotension: Mr. LProbyis a 68yo male withuncontrolled autoimmune diabetes mellitus and peripheral neuropathy whopresented to the ED after a syncopal episode and documented orthostatic hypotension. He has had dizziness, syncope, and diarrhea since 2017. Differential diagnosis included autonomic dysfunction 2/2 uncontrolled diabetes, adrenal insufficiency, hypovolemia  2/2 glucosuria, hypovolemia 2/2 chronic diarrhea, amyloidosis, POEMS. However, most likely diagnosis is autonomic dysfunction and hypovolemia 2/2 uncontrolled diabetes. He has had multiple admissions for  the same problem in recent years. He was started on midodrine with improvement in his orthostasis (although his BP laying down was elevated on the midodrine). Cardiac workup has been negative in the past. Imaging has shown no adrenal or brain masses in the past. A myriad of labs were collected to rule out more unlikely causes of his orthostasis. ESR was elevated to 50. ANA was positive. Other labs are listed above and should be followed at his hospital f/u appointment.  2. Uncontrolled autoimmune diabetes: A1c 12.7. Patient on no therapy on admission. Used to be on insulin and glipizide. Started on Lantus 6u qhs. Advised to log morning blood sugars and bring them with him to his f/u appointment. He will establish care with [facility] to improve glycemic control.  3. Chronic diarrhea: Two year history of night-time diarrhea which resolved during admission. Patient was unable to provide a stool sample. Were hoping to obtain (calprotectin, GI panel, osmolality, O&P, pancreatic elastase, potassium, sodium). Consider obtaining stool sample for these tests if diarrhea has returned on f/u.  4. Lower extremity wounds: Scarred lesions on left lower extremity from thigh to shin. Asymptomatic. No new lesions since 2017. Thought to be staph abscesses in the past. Prior skin bx consistent with external trauma. ANA performed during this admission to rule out discoid lupus. ANA and SSA positive. dsDNA negative. Please f/u ANA titer.  Discharge Vitals:   BP (!) 196/88 (BP Location: Right Arm)   Pulse 88   Temp 98.3 F (36.8 C) (Oral)   Resp 16   Wt 50.9 kg   SpO2 100%   BMI 21.20 kg/m   Pertinent Labs, Studies, and Procedures:  CMP Latest Ref Rng & Units 11/11/2018 11/11/2018 08/13/2018  Glucose 70 - 99 mg/dL 373(H) 385(H) 226(H)  BUN 8 - 23 mg/dL 35(H) 42(H) 24(H)  Creatinine 0.61 - 1.24 mg/dL 2.22(H) 2.68(H) 1.52(H)  Sodium 135 - 145 mmol/L 137 134(L) 136  Potassium 3.5 - 5.1 mmol/L 4.8 5.0 5.0  Chloride 98 - 111  mmol/L 111 104 100  CO2 22 - 32 mmol/L 20(L) 22 29  Calcium 8.9 - 10.3 mg/dL 8.3(L) 9.5 9.8  Total Protein 6.5 - 8.1 g/dL - 8.6(H) -  Total Bilirubin 0.3 - 1.2 mg/dL - 0.5 -  Alkaline Phos 38 - 126 U/L - 102 -  AST 15 - 41 U/L - 30 -  ALT 0 - 44 U/L - 41 -   CBC Latest Ref Rng & Units 11/11/2018 08/13/2018 01/12/2018  WBC 4.0 - 10.5 K/uL 5.2 4.0 6.0  Hemoglobin 13.0 - 17.0 g/dL 11.7(L) 12.3(L) 10.9(L)  Hematocrit 39.0 - 52.0 % 35.3(L) 37.6(L) 33.5(L)  Platelets 150 - 400 K/uL 164 162 149(L)   Discharge Instructions: Discharge Instructions    Discharge instructions   Complete by:  As directed    It was a pleasure taking care of you in the hospital, Brian Owen!  1. You were hospitalized for orthostatic hypotension, which is when your blood pressure drops when you stand up.   2. Please start taking the medication called Midodrine. Take 2.44m three times a day with your meals. The most important dose is the morning dose.  3. There are other ways to prevent orthostatic hypotension as well. We have provided you with an abdominal binder and compression stockings to wear. You should also eat a  high salt diet.  4. Your diabetes is currently uncontrolled. Please start taking Lantus 6 units at night. You should also measure your blood sugar every morning. Write down a log of your morning blood sugars and take that with you to your next doctor appointment.  5. Please follow up in the internal medicine clinic on the ground floor of the hospital on 1/23 at 10:45 am. You can establish care with our group of providers and we will follow your diabetes. I left a voice mail on your home phone with the information of this appointment through a [place] interpreter.  Feel free to call our clinic at [phone] if you have any questions.  Thanks, Dr. Annie Paras   Increase activity slowly   Complete by:  As directed       Signed: Dorrell, Andree Elk, MD 11/13/2018, 11:51 AM   Pager: [phone]

## 2018-11-13 NOTE — Progress Notes (Signed)
Brian Owen to be D/C'd Home per MD order.  Discussed prescriptions and follow up appointments with the patient. Prescriptions given to patient, medication list explained in detail. Pt verbalized understanding.  Allergies as of 11/13/2018   No Known Allergies     Medication List    STOP taking these medications   glipiZIDE 5 MG tablet Commonly known as:  GLUCOTROL   ondansetron 4 MG disintegrating tablet Commonly known as:  ZOFRAN ODT     TAKE these medications   ACCU-CHEK AVIVA PLUS w/Device Kit 1 Device by Does not apply route 3 (three) times daily after meals.   ACCU-CHEK SOFTCLIX LANCETS lancets 1 each by Other route 3 (three) times daily.   glucose blood test strip Commonly known as:  ACCU-CHEK AVIVA PLUS 1 each by Other route 3 (three) times daily.   insulin glargine 100 UNIT/ML injection Commonly known as:  LANTUS Inject 0.06 mLs (6 Units total) into the skin at bedtime.   insulin starter kit- pen needles Misc 1 kit by Other route once for 1 dose.   midodrine 2.5 MG tablet Commonly known as:  PROAMATINE Take 1 tablet (2.5 mg total) by mouth 3 (three) times daily with meals.       Vitals:   11/13/18 1110 11/13/18 1532  BP: (!) 196/88 (!) 196/88  Pulse:  88  Resp:  16  Temp:  98.3 F (36.8 C)  SpO2:      Skin clean, dry and intact without evidence of skin break down, no evidence of skin tears noted. IV catheter discontinued intact. Site without signs and symptoms of complications. Dressing and pressure applied. Pt denies pain at this time. No complaints noted.  An After Visit Summary was printed and given to the patient. Patient escorted via [place], and D/C home via private auto.  Aneta Mins BSN, RN

## 2018-11-13 NOTE — Progress Notes (Signed)
Inpatient Diabetes Program Recommendations  AACE/ADA: New Consensus Statement on Inpatient Glycemic Control (2015)  Target Ranges:  Prepandial:   less than 140 mg/dL      Peak postprandial:   less than 180 mg/dL (1-2 hours)      Critically ill patients:  140 - 180 mg/dL   Lab Results  Component Value Date   GLUCAP 150 (H) 11/13/2018   HGBA1C 12.7 (H) 11/11/2018    Review of Glycemic ControlResults for Brian Owen, Brian Owen [facility] (MRN [id number]) as of 11/13/2018 10:18  Ref. Range 11/12/2018 12:07 11/12/2018 16:43 11/12/2018 21:52 11/13/2018 07:50  Glucose-Capillary Latest Ref Range: 70 - 99 mg/dL 282 (H) 218 (H) 286 (H) 150 (H)   Diabetes history: "Autoimmune diabetes" per H&P Outpatient Diabetes medications:  Glucotrol 5 mg q AM (patient not taking per history) Current orders for Inpatient glycemic control:  Novolog sensitive tid with meals and HS, Lantus 6 units q HS  Inpatient Diabetes Program Recommendations:    Blood sugars improved this morning with Lantus.  Visited with patient, wife and daughter.  Offered "stratus" but daughter states she is able to translate.  Discussed insulin with patient and wife.  They both refuse to administer insulin however daughter states "I'll do it".  Both of her parents have diabetes and she has given insulin to them both in the past.  She states that her mothers diabetes has improved however her father "stopped taking insulin, because he did not like it".  Explained to them all the importance of him taking his medications.  Wife complains that he "eats all the time" and that she is unable to control his diet.  We discussed that often when blood sugars are elevated, patient's are more hungry and that insulin will help control blood sugars.  Explained that patient will need one shot per day and needs to check blood sugars at least 2 times a day. Daughter states she can do this for patient.  Allowed daughter to practice with insulin pen and reviewed placing needle, 2 unit  prime, and how to administer/dial dose.  She seemed very familiar stating "I know how to do this".  Teaching done on hypoglycemia signs symptoms and treatment.  Teach back used at the end.  Patient, wife and daughter were able to teach back normal blood sugar levels, that patient needs one shot per day, that blood sugars need to be checked 2 times a day, and signs, symptoms and treatment of hypoglycemia.  Patient did interact with education and I explained that daughter is trying to help him and that he needs medicine unless Dr. Barnet Pall.  Patients daughter perfers insulin pen- Unsure of insurance coverage for insulin so case management consult placed.  He will also need f/u ASAP with PCP.  Daughter states that he has been seen at [facility] in the past.   Upon d/c patient will need rx for : -Lantus solostar pen (as long as this is covered by insurance)-#[id number] -insulin pen needles-#[id number] -glucose meter/lancets etc. -#[id number]   Thanks,  Adah Perl, RN, BC-ADM Inpatient Diabetes Coordinator Pager [phone] (8a-5p)

## 2018-11-13 NOTE — Progress Notes (Signed)
   Subjective: No overnight events. Patient states he feels well. He has not had diarrhea for 2 days. Denies feeling lightheaded or dizzy when he stands. He was informed his BP drops when he stands and this likely caused his dizziness and falls at home. He knows he used to take a lot of medications but does not know the names because he cannot read [place]. His kids help him with his medications. He was informed he is on a medication called midodrine to help his blood pressure stay elevated and that we will restart this. He was also informed he was started on a long acting insulin. He is amenable to following in the [facility]. All questions and concerns addressed.  Interview was completed with a Water quality scientist.  Objective:  Vital signs in last 24 hours: Vitals:   11/12/18 1036 11/12/18 1846 11/12/18 2150 11/13/18 0431  BP: 138/76 (!) 140/102 134/79 (!) 153/88  Pulse: 85 86 79 75  Resp: 18 18 16 16   Temp: 97.8 F (36.6 C) 97.8 F (36.6 C) 98.5 F (36.9 C) 98.3 F (36.8 C)  TempSrc: Oral Oral Oral Oral  SpO2: 100% 100% 99% 100%  Weight:       Gen: no acute distress, comfortably sitting in bed Lungs: ctab, no wheezing Ext: no edema, multiple scarred lesions on the left lower extremity from the hip to the shin  Assessment/Plan:  Active Problems:   Orthostatic hypotension   Orthostatic hypertension   Diabetic peripheral neuropathy associated with type 2 diabetes mellitus [facility])  Mr. Baby is a 68 yo male withuncontrolled autoimmune diabetes mellitus and peripheral neuropathy whopresented to the ED after a syncopal episode and documented orthostatic hypotension. He has had dizziness, syncope, and diarrhea for the past two years.  Orthostatic hypotension - DDx: autonomic dysfunction 2/2 uncontrolled diabetes, adrenal insufficiency, hypovolemia 2/2 glucosuria, hypovolemia 2/2 chronic diarrhea, amyloidosis, POEMS. However, the most likely diagnosis is autonomic dysfunction and  dehydration in the setting of uncontrolled diabetes. - Patient has been on midodrine in the past for orthostatic hypotension, but he was tapered off of this medication in 02/2017 for hypertension. Will resume this medication today.  - Can stop fluids because good PO intake with resolution of diarrhea. - Hep B negative. HIV non-reactive. Plan - Discontinue IVF - Start Midodrine at 5mg  TID - Non-pharmacological therapy with abdominal binder and compression stockings - F/u ACTH stim test  - F/u hep C, quantiferon gold, IFE, kappa/lambda free light chains, immunoglobulins, and ANA - PT eval and treat - Repeat orthostatics after initiation of Midodrine. If well tolerated, plan for discharge home today.  Diarrhea  - No BMs since admission. Therefore, stool studies have not been collected. Plan  - F/u stool studies and tissue transglutaminase if patient is able to provide a sample.  Uncontrolled autoimmune diabetes - Received first dose of lantus 6u last night. Am blood sugar 150. Plan - Lantus 6u qhs - SSI - Diabetes education with nursing staff and diabetes coordinator  Dispo: Anticipated discharge today.  Musette Kisamore, Andree Elk, MD 11/13/2018, 6:15 AM Pager: [phone]

## 2018-11-13 NOTE — Care Management (Signed)
CM confirmed with pharmacy that lantus will cost approximately $8.

## 2018-11-13 NOTE — Care Management (Signed)
#    3.   S/W  JONTRAY  @ [facility] #  [id number]   INSULIN:  LANTUS  16 UNITS DAILY COVER- YES CO-PAY- $ 8.95 TIER- 3 DRUG PRIOR APPROVAL- NO  PREFERRED PHARMACY : YES  -  [facility]

## 2018-11-14 LAB — IMMUNOGLOBULINS A/E/G/M, SERUM
IgA: 603 mg/dL — ABNORMAL HIGH (ref 61–437)
IgE (Immunoglobulin E), Serum: 793 IU/mL — ABNORMAL HIGH (ref 6–495)
IgG (Immunoglobin G), Serum: 1476 mg/dL (ref 700–1600)
IgM (Immunoglobulin M), Srm: 43 mg/dL (ref 20–172)

## 2018-11-14 LAB — IMMUNOFIXATION ELECTROPHORESIS
IgA: 631 mg/dL — ABNORMAL HIGH (ref 61–437)
IgG (Immunoglobin G), Serum: 1600 mg/dL (ref 700–1600)
IgM (Immunoglobulin M), Srm: 44 mg/dL (ref 20–172)
TOTAL PROTEIN ELP: 7 g/dL (ref 6.0–8.5)

## 2018-11-14 LAB — ANTINUCLEAR ANTIBODIES, IFA: ANA Ab, IFA: NEGATIVE

## 2018-11-15 NOTE — Progress Notes (Signed)
Patient's labs have started to resolve after discharge. Notable findings include a negative ANA titer (<1:80), negative quantiferon gold, and negative tissue transglutaminase. IFE shows a polyclonal gammopathy, which is consistent with a chronic inflammatory condition or infection. His IgA and IgE levels are elevated, which are also indicative of a nonspecific chronic inflammatory condition. Please consider obtaining a 24-hour urine for total protein and immunofixation electrophoresis per Dr. Azucena Freed recommendations at his follow-up visit.

## 2018-11-20 ENCOUNTER — Encounter: Payer: Self-pay | Admitting: Family Medicine

## 2018-11-20 ENCOUNTER — Ambulatory Visit: Payer: Medicare Other

## 2018-11-23 ENCOUNTER — Other Ambulatory Visit: Payer: Self-pay

## 2018-11-23 ENCOUNTER — Emergency Department (HOSPITAL_COMMUNITY)
Admission: EM | Admit: 2018-11-23 | Discharge: 2018-11-23 | Disposition: A | Discharge status: Home / Self Care | Payer: Medicare Other | Attending: Emergency Medicine | Admitting: Emergency Medicine

## 2018-11-23 DIAGNOSIS — E1122 Type 2 diabetes mellitus with diabetic chronic kidney disease: Secondary | ICD-10-CM | POA: Insufficient documentation

## 2018-11-23 DIAGNOSIS — N183 Chronic kidney disease, stage 3 (moderate): Secondary | ICD-10-CM | POA: Insufficient documentation

## 2018-11-23 DIAGNOSIS — Z87891 Personal history of nicotine dependence: Secondary | ICD-10-CM | POA: Insufficient documentation

## 2018-11-23 DIAGNOSIS — I10 Essential (primary) hypertension: Secondary | ICD-10-CM | POA: Diagnosis not present

## 2018-11-23 DIAGNOSIS — R739 Hyperglycemia, unspecified: Secondary | ICD-10-CM

## 2018-11-23 DIAGNOSIS — R42 Dizziness and giddiness: Secondary | ICD-10-CM

## 2018-11-23 DIAGNOSIS — Z794 Long term (current) use of insulin: Secondary | ICD-10-CM | POA: Diagnosis not present

## 2018-11-23 DIAGNOSIS — Z79899 Other long term (current) drug therapy: Secondary | ICD-10-CM | POA: Insufficient documentation

## 2018-11-23 DIAGNOSIS — E1165 Type 2 diabetes mellitus with hyperglycemia: Secondary | ICD-10-CM | POA: Diagnosis not present

## 2018-11-23 DIAGNOSIS — I129 Hypertensive chronic kidney disease with stage 1 through stage 4 chronic kidney disease, or unspecified chronic kidney disease: Secondary | ICD-10-CM | POA: Insufficient documentation

## 2018-11-23 DIAGNOSIS — R5381 Other malaise: Secondary | ICD-10-CM | POA: Diagnosis not present

## 2018-11-23 DIAGNOSIS — R55 Syncope and collapse: Secondary | ICD-10-CM | POA: Diagnosis not present

## 2018-11-23 DIAGNOSIS — E860 Dehydration: Secondary | ICD-10-CM

## 2018-11-23 LAB — CBC WITH DIFFERENTIAL/PLATELET
Abs Immature Granulocytes: 0.02 10*3/uL (ref 0.00–0.07)
BASOS ABS: 0 10*3/uL (ref 0.0–0.1)
Basophils Relative: 1 %
Eosinophils Absolute: 0.1 10*3/uL (ref 0.0–0.5)
Eosinophils Relative: 1 %
HCT: 33.6 % — ABNORMAL LOW (ref 39.0–52.0)
Hemoglobin: 10.9 g/dL — ABNORMAL LOW (ref 13.0–17.0)
Immature Granulocytes: 0 %
Lymphocytes Relative: 17 %
Lymphs Abs: 1 10*3/uL (ref 0.7–4.0)
MCH: 30.3 pg (ref 26.0–34.0)
MCHC: 32.4 g/dL (ref 30.0–36.0)
MCV: 93.3 fL (ref 80.0–100.0)
Monocytes Absolute: 0.5 10*3/uL (ref 0.1–1.0)
Monocytes Relative: 8 %
NEUTROS ABS: 4.4 10*3/uL (ref 1.7–7.7)
NRBC: 0 % (ref 0.0–0.2)
Neutrophils Relative %: 73 %
Platelets: 192 10*3/uL (ref 150–400)
RBC: 3.6 MIL/uL — ABNORMAL LOW (ref 4.22–5.81)
RDW: 12.8 % (ref 11.5–15.5)
WBC: 6 10*3/uL (ref 4.0–10.5)

## 2018-11-23 LAB — BASIC METABOLIC PANEL
ANION GAP: 8 (ref 5–15)
BUN: 29 mg/dL — ABNORMAL HIGH (ref 8–23)
CO2: 20 mmol/L — ABNORMAL LOW (ref 22–32)
Calcium: 8.4 mg/dL — ABNORMAL LOW (ref 8.9–10.3)
Chloride: 109 mmol/L (ref 98–111)
Creatinine, Ser: 2.03 mg/dL — ABNORMAL HIGH (ref 0.61–1.24)
GFR calc Af Amer: 38 mL/min — ABNORMAL LOW (ref >60)
GFR calc non Af Amer: 33 mL/min — ABNORMAL LOW (ref >60)
Glucose, Bld: 271 mg/dL — ABNORMAL HIGH (ref 70–99)
Potassium: 4 mmol/L (ref 3.5–5.1)
Sodium: 137 mmol/L (ref 135–145)

## 2018-11-23 LAB — CBG MONITORING, ED
Glucose-Capillary: 553 mg/dL (ref 70–99)
Glucose-Capillary: 300 mg/dL — ABNORMAL HIGH (ref 70–99)
Glucose-Capillary: 172 mg/dL — ABNORMAL HIGH (ref 70–99)

## 2018-11-23 MED ORDER — SODIUM CHLORIDE 0.9 % IV SOLN: INTRAVENOUS | Status: DC

## 2018-11-23 MED ORDER — MECLIZINE HCL 25 MG PO TABS
25.0000 mg | ORAL_TABLET | Freq: Once | ORAL | Status: AC
  Administered 2018-11-23: 25 mg via ORAL
  Filled 2018-11-23: qty 1

## 2018-11-23 MED ORDER — DEXTROSE-NACL 5-0.45 % IV SOLN: INTRAVENOUS | Status: DC

## 2018-11-23 MED ORDER — SODIUM CHLORIDE 0.9 % IV BOLUS
1000.0000 mL | Freq: Once | INTRAVENOUS | Status: AC
  Administered 2018-11-23: 1000 mL via INTRAVENOUS

## 2018-11-23 MED ORDER — INSULIN REGULAR(HUMAN) IN NACL 100-0.9 UT/100ML-% IV SOLN
INTRAVENOUS | Status: DC
  Administered 2018-11-23: 4.9 [IU]/h via INTRAVENOUS
  Filled 2018-11-23: qty 100

## 2018-11-23 NOTE — ED Provider Notes (Addendum)
[facility] DEPT Provider Note   CSN: [id number] Arrival date & time: 11/23/18  1430     History   Chief Complaint Chief Complaint  Patient presents with  . Hyperglycemia    HPI Brian Owen is a 68 y.o. male.  The history is provided by the patient and medical records. The history is limited by a language barrier. A language interpreter was used.     68 year old [place] male with history of poorly controlled diabetes, tobacco abuse, brought here via EMS from home for complaining of feeling dizzy.  History obtained through [facility] language interpreter.  Patient report this morning he was taking a shower and while he was in the time he developed a significant dizziness with room spinning sensation, and pain in his neck.  He felt that his vision was narrowing, felt that he was going to die and called out for help.  Symptom lasting for approximately an hour and has since improved.  He did not complain of any pain in his chest, trouble breathing, diaphoresis, abdominal pain or back pain.  He does complain of tingling sensation through out all 4 extremities.  He mention this symptom has happened multiple times in the past but this episode is much more severe.  States that he felt he was dying.  When EMS arrived, he was told that his blood sugar is high.  He mention he has not been eating more than he should and he has been monitoring his diet.  He denies any recent sickness.  Past Medical History:  Diagnosis Date  . Bullae 11/11/2015   legs/notes 11/11/2015  . Chronic kidney disease   . Heavy cigarette smoker   . Parasite infection 2016   "couldn't afford to have surgery done; just left it; lower legs"  . Uncontrolled type II diabetes mellitus ([place]) dx'd 2013    Patient Active Problem List   Diagnosis Date Noted  . Orthostatic hypertension 11/12/2018  . Diabetic peripheral neuropathy associated with type 2 diabetes mellitus ([place])   . Chronic diarrhea   .  Uncontrolled type 2 diabetes mellitus with hyperglycemia ([place])   . Gastroenteritis due to norovirus 01/12/2018  . AKI (acute kidney injury) ([place]) 01/12/2018  . Type II diabetes mellitus with renal manifestations ([place]) 01/11/2018  . Absent pedal pulses 09/27/2017  . Syncope 01/13/2017  . Postural dizziness with presyncope   . CKD (chronic kidney disease), stage III ([place]) 06/16/2016  . Underweight 01/17/2016  . Autonomic neuropathy due to diabetes ([place]) 11/29/2015  . Hyperglycemia 11/27/2015  . Hypotension 11/27/2015  . Vertigo 11/27/2015  . Orthostatic hypotension 11/27/2015  . SVT (supraventricular tachycardia) ([place]) 11/11/2015  . Diarrhea 08/17/2014  . Protein-calorie malnutrition, severe ([place]) 07/26/2014  . History of tobacco use 06/27/2014  . Edentulism, complete 06/27/2014  . Language barrier to communication 06/27/2014  . HTN (hypertension) 06/27/2014  . Hyponatremia 05/13/2014  . Type 2 diabetes mellitus with hyperglycemia, with long-term current use of insulin ([place]) 05/13/2014    Past Surgical History:  Procedure Laterality Date  . NO PAST SURGERIES          Home Medications    Prior to Admission medications   Medication Sig Start Date End Date Taking? Authorizing Provider  ACCU-CHEK SOFTCLIX LANCETS lancets 1 each by Other route 3 (three) times daily. 08/13/18   Hayden Rasmussen, MD  Blood Glucose Monitoring Suppl (ACCU-CHEK AVIVA PLUS) w/Device KIT 1 Device by Does not apply route 3 (three) times daily after meals. 08/13/18  Hayden Rasmussen, MD  glucose blood (ACCU-CHEK AVIVA PLUS) test strip 1 each by Other route 3 (three) times daily. 08/13/18   Hayden Rasmussen, MD  insulin glargine (LANTUS) 100 UNIT/ML injection Inject 0.06 mLs (6 Units total) into the skin at bedtime. 11/13/18   Dorrell, Andree Elk, MD  midodrine (PROAMATINE) 2.5 MG tablet Take 1 tablet (2.5 mg total) by mouth 3 (three) times daily with meals. 11/13/18   Dorrell, Andree Elk, MD    Family  History Family History  Problem Relation Age of Onset  . Diabetes Other   . Diabetes Other     Social History Social History   Tobacco Use  . Smoking status: Former Smoker    Packs/day: 1.50    Years: 48.00    Pack years: 72.00    Types: Cigarettes    Last attempt to quit: 2015    Years since quitting: 5.0  . Smokeless tobacco: Never Used  Substance Use Topics  . Alcohol use: No  . Drug use: No     Allergies   Patient has no known allergies.   Review of Systems Review of Systems  All other systems reviewed and are negative.    Physical Exam Updated Vital Signs BP (!) 177/89 (BP Location: Right Arm)   Pulse 96   Temp 98.2 F (36.8 C) (Oral)   Resp 16   SpO2 98%   Physical Exam Vitals signs and nursing note reviewed.  Constitutional:      General: He is not in acute distress.    Appearance: He is well-developed.     Comments: Elderly male resting bed in no acute discomfort.  HENT:     Head: Normocephalic and atraumatic.     Mouth/Throat:     Mouth: Mucous membranes are moist.  Eyes:     Conjunctiva/sclera: Conjunctivae normal.     Comments: Evidence of cataracts involving both pupils left greater than right.  Neck:     Musculoskeletal: Neck supple. No neck rigidity.     Vascular: No carotid bruit.  Cardiovascular:     Rate and Rhythm: Normal rate and regular rhythm.     Pulses: Normal pulses.     Heart sounds: Normal heart sounds.  Pulmonary:     Effort: Pulmonary effort is normal.     Breath sounds: Normal breath sounds.  Abdominal:     Palpations: Abdomen is soft.     Tenderness: There is no abdominal tenderness.  Skin:    Findings: No rash.  Neurological:     Mental Status: He is alert.     GCS: GCS eye subscore is 4. GCS verbal subscore is 5. GCS motor subscore is 6.     Cranial Nerves: Cranial nerves are intact.     Sensory: Sensation is intact.     Comments: Able to move all 4 extremities.  Is alert and oriented x3.      ED  Treatments / Results  Labs (all labs ordered are listed, but only abnormal results are displayed) Labs Reviewed  CBC WITH DIFFERENTIAL/PLATELET - Abnormal; Notable for the following components:      Result Value   RBC 3.60 (*)    Hemoglobin 10.9 (*)    HCT 33.6 (*)    All other components within normal limits  BASIC METABOLIC PANEL - Abnormal; Notable for the following components:   CO2 20 (*)    Glucose, Bld 271 (*)    BUN 29 (*)    Creatinine, Ser 2.03 (*)  Calcium 8.4 (*)    GFR calc non Af Amer 33 (*)    GFR calc Af Amer 38 (*)    All other components within normal limits  CBG MONITORING, ED - Abnormal; Notable for the following components:   Glucose-Capillary 553 (*)    All other components within normal limits  CBG MONITORING, ED - Abnormal; Notable for the following components:   Glucose-Capillary 300 (*)    All other components within normal limits  CBG MONITORING, ED - Abnormal; Notable for the following components:   Glucose-Capillary 172 (*)    All other components within normal limits  URINALYSIS, ROUTINE W REFLEX MICROSCOPIC    EKG EKG Interpretation  Date/Time:  Sunday November 23 2018 16:32:08 EST Ventricular Rate:  90 PR Interval:    QRS Duration: 83 QT Interval:  345 QTC Calculation: 423 R Axis:   66 Text Interpretation:  Sinus rhythm no acute ST/T changes no significant change since Nov 11 2018 Confirmed by Sherwood Gambler [phone]) on 11/23/2018 4:39:35 PM Also confirmed by Sherwood Gambler [phone]), editor Radene Gunning [phone])  on 11/23/2018 4:47:29 PM   Radiology No results found.  Procedures .Critical Care Performed by: Domenic Moras, PA-C Authorized by: Domenic Moras, PA-C   Critical care provider statement:    Critical care time (minutes):  45   Critical care was time spent personally by me on the following activities:  Discussions with consultants, evaluation of patient's response to treatment, examination of patient, ordering and performing  treatments and interventions, ordering and review of laboratory studies, ordering and review of radiographic studies, pulse oximetry, re-evaluation of patient's condition, obtaining history from patient or surrogate and review of old charts   (including critical care time)  Medications Ordered in ED Medications  sodium chloride 0.9 % bolus 1,000 mL (0 mLs Intravenous Stopped 11/23/18 1738)    And  0.9 %  sodium chloride infusion (has no administration in time range)  sodium chloride 0.9 % bolus 1,000 mL (0 mLs Intravenous Stopped 11/23/18 1738)  meclizine (ANTIVERT) tablet 25 mg (25 mg Oral Given 11/23/18 1804)     Initial Impression / Assessment and Plan / ED Course  I have reviewed the triage vital signs and the nursing notes.  Pertinent labs & imaging results that were available during my care of the patient were reviewed by me and considered in my medical decision making (see chart for details).     BP 139/81   Pulse 84   Temp 98.2 F (36.8 C) (Oral)   Resp 15   SpO2 98%    Final Clinical Impressions(s) / ED Diagnoses   Final diagnoses:  Hyperglycemia  Dehydration  Vertigo    ED Discharge Orders    None     3:39 PM Patient with history of postural dizziness and syncope who had had multiple admissions and extensive work-up in the past, most likely diagnosis is autonomic dysfunction and hypovolemia secondary to uncontrolled diabetes.  He is here with intermittent episodes of dizziness that felt similar to prior exam more intense.  He was treated with Midrin 2.5 mg 3 times daily in the past but the medication was discontinued due to high blood pressure.  Patient has uncontrolled autoimmune diabetes and his last hemoglobin A1c is 12.7.  7:06 PM Patient has positive orthostatic vital sign.  He was found to be hyperglycemic with initial CBG 553 without anion gap.  Evidence of mild AKI with creatinine 2.03, this is near his baseline.  Patient was given IV  fluid for hydration.   He also received insulin.  He felt much better after treatment.  Able to ambulate.  Stable for discharge.  Return precaution discussed. Care discussed with Dr. Regenia Skeeter.    Domenic Moras, PA-C 11/23/18 Einar Crow    Sherwood Gambler, MD 11/24/18 0007  ADDENDUM: CC time added    Domenic Moras, PA-C 12/02/18 2256    Sherwood Gambler, MD 12/03/18 [phone]

## 2018-11-23 NOTE — ED Notes (Signed)
Bed: WA03 Expected date:  Expected time:  Means of arrival:  Comments: 68 yo hyperglycemia

## 2018-11-23 NOTE — ED Notes (Signed)
Pt was able to ambulate on own. Pt given sandwich per request

## 2018-11-23 NOTE — ED Triage Notes (Signed)
Pt BIBA from home with c/o high blood sugar, EMS reports being called out for dizziness.  Pt reports being dizzy for 2 days. Pt takes insulin but family reports that he has poorly controlled diet.

## 2018-11-23 NOTE — ED Notes (Signed)
Pt unable to give urine sample at this time 

## 2018-11-23 NOTE — Discharge Instructions (Signed)
You have been evaluated for your dizziness.  It is likely due to dehydration from high blood sugar.  Please monitor your blood sugar closely, stay hydrated, and follow-up with your doctor for further care.  Return if you have any concerns.

## 2018-11-23 NOTE — ED Notes (Addendum)
Pt from home.  Use of interpreter- "I was showering, felt my shoulders aching, dizzy and almost fell at home. I checked my blood sugar and it said 600" "My children caught me bc I called them." "It felt like my neck was disconnected, I thought I was going to die" Denies hit head, LOC. Pt reports "no vomiting." Pt reports "not picked it (insulin) up yet" "My family has been busy and have not picked up the medicine because they ask [place] to sign paperwork and for an ID" Pt reports going 1 week without insulin. Pt denies currently being in pain.

## 2018-12-04 ENCOUNTER — Emergency Department (HOSPITAL_COMMUNITY): Payer: Medicare Other

## 2018-12-04 ENCOUNTER — Encounter (HOSPITAL_COMMUNITY): Payer: Self-pay | Admitting: Emergency Medicine

## 2018-12-04 ENCOUNTER — Observation Stay (HOSPITAL_COMMUNITY)
Admission: EM | Admit: 2018-12-04 | Discharge: 2018-12-05 | Disposition: A | Discharge status: Home / Self Care | Payer: Medicare Other | Attending: Oncology | Admitting: Oncology

## 2018-12-04 DIAGNOSIS — R000 Tachycardia, unspecified: Secondary | ICD-10-CM | POA: Diagnosis not present

## 2018-12-04 DIAGNOSIS — S199XXA Unspecified injury of neck, initial encounter: Secondary | ICD-10-CM | POA: Diagnosis not present

## 2018-12-04 DIAGNOSIS — E1165 Type 2 diabetes mellitus with hyperglycemia: Secondary | ICD-10-CM | POA: Diagnosis present

## 2018-12-04 DIAGNOSIS — D649 Anemia, unspecified: Secondary | ICD-10-CM | POA: Insufficient documentation

## 2018-12-04 DIAGNOSIS — R823 Hemoglobinuria: Secondary | ICD-10-CM | POA: Insufficient documentation

## 2018-12-04 DIAGNOSIS — N179 Acute kidney failure, unspecified: Secondary | ICD-10-CM | POA: Diagnosis present

## 2018-12-04 DIAGNOSIS — J324 Chronic pansinusitis: Secondary | ICD-10-CM | POA: Diagnosis not present

## 2018-12-04 DIAGNOSIS — R509 Fever, unspecified: Secondary | ICD-10-CM | POA: Diagnosis not present

## 2018-12-04 DIAGNOSIS — R0689 Other abnormalities of breathing: Secondary | ICD-10-CM | POA: Diagnosis not present

## 2018-12-04 DIAGNOSIS — S0990XA Unspecified injury of head, initial encounter: Secondary | ICD-10-CM | POA: Diagnosis not present

## 2018-12-04 DIAGNOSIS — R531 Weakness: Secondary | ICD-10-CM | POA: Insufficient documentation

## 2018-12-04 DIAGNOSIS — E1343 Other specified diabetes mellitus with diabetic autonomic (poly)neuropathy: Secondary | ICD-10-CM

## 2018-12-04 DIAGNOSIS — E0590 Thyrotoxicosis, unspecified without thyrotoxic crisis or storm: Secondary | ICD-10-CM | POA: Diagnosis not present

## 2018-12-04 DIAGNOSIS — I129 Hypertensive chronic kidney disease with stage 1 through stage 4 chronic kidney disease, or unspecified chronic kidney disease: Secondary | ICD-10-CM | POA: Diagnosis not present

## 2018-12-04 DIAGNOSIS — Z9114 Patient's other noncompliance with medication regimen: Secondary | ICD-10-CM | POA: Insufficient documentation

## 2018-12-04 DIAGNOSIS — N189 Chronic kidney disease, unspecified: Secondary | ICD-10-CM | POA: Insufficient documentation

## 2018-12-04 DIAGNOSIS — E1040 Type 1 diabetes mellitus with diabetic neuropathy, unspecified: Secondary | ICD-10-CM | POA: Insufficient documentation

## 2018-12-04 DIAGNOSIS — R42 Dizziness and giddiness: Secondary | ICD-10-CM | POA: Diagnosis not present

## 2018-12-04 DIAGNOSIS — Z87891 Personal history of nicotine dependence: Secondary | ICD-10-CM | POA: Insufficient documentation

## 2018-12-04 DIAGNOSIS — N183 Chronic kidney disease, stage 3 unspecified: Secondary | ICD-10-CM

## 2018-12-04 DIAGNOSIS — S299XXA Unspecified injury of thorax, initial encounter: Secondary | ICD-10-CM | POA: Diagnosis not present

## 2018-12-04 DIAGNOSIS — Z794 Long term (current) use of insulin: Secondary | ICD-10-CM | POA: Diagnosis not present

## 2018-12-04 DIAGNOSIS — E1022 Type 1 diabetes mellitus with diabetic chronic kidney disease: Secondary | ICD-10-CM | POA: Diagnosis not present

## 2018-12-04 DIAGNOSIS — I10 Essential (primary) hypertension: Secondary | ICD-10-CM | POA: Diagnosis not present

## 2018-12-04 DIAGNOSIS — I951 Orthostatic hypotension: Principal | ICD-10-CM | POA: Insufficient documentation

## 2018-12-04 LAB — URINALYSIS, ROUTINE W REFLEX MICROSCOPIC
Bacteria, UA: NONE SEEN
Bilirubin Urine: NEGATIVE
Glucose, UA: >=500 mg/dL — AB
Ketones, ur: NEGATIVE mg/dL
Leukocytes, UA: NEGATIVE
Nitrite: NEGATIVE
Protein, ur: 30 mg/dL — AB
Specific Gravity, Urine: 1.01 (ref 1.005–1.030)
pH: 6 (ref 5.0–8.0)

## 2018-12-04 LAB — CBC WITH DIFFERENTIAL/PLATELET
Abs Immature Granulocytes: 0.05 10*3/uL (ref 0.00–0.07)
Basophils Absolute: 0 10*3/uL (ref 0.0–0.1)
Basophils Relative: 0 %
Eosinophils Absolute: 0.1 10*3/uL (ref 0.0–0.5)
Eosinophils Relative: 0 %
HCT: 33.7 % — ABNORMAL LOW (ref 39.0–52.0)
Hemoglobin: 10.9 g/dL — ABNORMAL LOW (ref 13.0–17.0)
Immature Granulocytes: 0 %
Lymphocytes Relative: 8 %
Lymphs Abs: 1.1 10*3/uL (ref 0.7–4.0)
MCH: 29.1 pg (ref 26.0–34.0)
MCHC: 32.3 g/dL (ref 30.0–36.0)
MCV: 90.1 fL (ref 80.0–100.0)
Monocytes Absolute: 0.5 10*3/uL (ref 0.1–1.0)
Monocytes Relative: 4 %
NEUTROS ABS: 11.2 10*3/uL — AB (ref 1.7–7.7)
Neutrophils Relative %: 88 %
Platelets: 268 10*3/uL (ref 150–400)
RBC: 3.74 MIL/uL — ABNORMAL LOW (ref 4.22–5.81)
RDW: 12 % (ref 11.5–15.5)
WBC: 13 10*3/uL — ABNORMAL HIGH (ref 4.0–10.5)
nRBC: 0 % (ref 0.0–0.2)

## 2018-12-04 LAB — INFLUENZA PANEL BY PCR (TYPE A & B)
Influenza A By PCR: NEGATIVE
Influenza B By PCR: NEGATIVE

## 2018-12-04 LAB — TROPONIN I: Troponin I: <0.03 ng/mL (ref <0.03)

## 2018-12-04 LAB — CK: CK TOTAL: 184 U/L (ref 49–397)

## 2018-12-04 LAB — COMPREHENSIVE METABOLIC PANEL
ALT: 12 U/L (ref 0–44)
AST: 18 U/L (ref 15–41)
Albumin: 3.1 g/dL — ABNORMAL LOW (ref 3.5–5.0)
Alkaline Phosphatase: 72 U/L (ref 38–126)
Anion gap: 16 — ABNORMAL HIGH (ref 5–15)
BUN: 29 mg/dL — ABNORMAL HIGH (ref 8–23)
CHLORIDE: 94 mmol/L — AB (ref 98–111)
CO2: 23 mmol/L (ref 22–32)
Calcium: 9.5 mg/dL (ref 8.9–10.3)
Creatinine, Ser: 2.31 mg/dL — ABNORMAL HIGH (ref 0.61–1.24)
GFR calc Af Amer: 33 mL/min — ABNORMAL LOW (ref >60)
GFR calc non Af Amer: 28 mL/min — ABNORMAL LOW (ref >60)
Glucose, Bld: 276 mg/dL — ABNORMAL HIGH (ref 70–99)
Potassium: 4.6 mmol/L (ref 3.5–5.1)
Sodium: 133 mmol/L — ABNORMAL LOW (ref 135–145)
Total Bilirubin: 0.8 mg/dL (ref 0.3–1.2)
Total Protein: 8.7 g/dL — ABNORMAL HIGH (ref 6.5–8.1)

## 2018-12-04 LAB — GLUCOSE, CAPILLARY: GLUCOSE-CAPILLARY: 303 mg/dL — AB (ref 70–99)

## 2018-12-04 LAB — LACTIC ACID, PLASMA: LACTIC ACID, VENOUS: 1.2 mmol/L (ref 0.5–1.9)

## 2018-12-04 MED ORDER — SODIUM CHLORIDE 0.9 % IV BOLUS
1000.0000 mL | Freq: Once | INTRAVENOUS | Status: AC
  Administered 2018-12-04: 1000 mL via INTRAVENOUS

## 2018-12-04 MED ORDER — MIDODRINE HCL 5 MG PO TABS
2.5000 mg | ORAL_TABLET | Freq: Three times a day (TID) | ORAL | Status: DC
  Administered 2018-12-05 (×2): 2.5 mg via ORAL
  Filled 2018-12-04 (×2): qty 1

## 2018-12-04 MED ORDER — INSULIN GLARGINE 100 UNIT/ML ~~LOC~~ SOLN
6.0000 [IU] | Freq: Every day | SUBCUTANEOUS | Status: DC
  Administered 2018-12-04: 6 [IU] via SUBCUTANEOUS
  Filled 2018-12-04: qty 0.06

## 2018-12-04 MED ORDER — ACETAMINOPHEN 325 MG PO TABS
650.0000 mg | ORAL_TABLET | Freq: Once | ORAL | Status: AC
  Administered 2018-12-04: 650 mg via ORAL
  Filled 2018-12-04: qty 2

## 2018-12-04 MED ORDER — LACTATED RINGERS IV BOLUS: 500.0000 mL | Freq: Once | INTRAVENOUS | Status: DC

## 2018-12-04 MED ORDER — ACETAMINOPHEN 650 MG RE SUPP: 650.0000 mg | Freq: Four times a day (QID) | RECTAL | Status: DC | PRN

## 2018-12-04 MED ORDER — ACETAMINOPHEN 325 MG PO TABS: 650.0000 mg | ORAL_TABLET | Freq: Four times a day (QID) | ORAL | Status: DC | PRN

## 2018-12-04 MED ORDER — MIDODRINE HCL 2.5 MG PO TABS
2.5000 mg | ORAL_TABLET | Freq: Once | ORAL | Status: AC
  Administered 2018-12-04: 2.5 mg via ORAL
  Filled 2018-12-04: qty 1

## 2018-12-04 MED ORDER — SODIUM CHLORIDE 0.9 % IV SOLN
INTRAVENOUS | Status: AC
  Administered 2018-12-04: 18:00:00 via INTRAVENOUS

## 2018-12-04 MED ORDER — GLUCERNA SHAKE PO LIQD
237.0000 mL | Freq: Two times a day (BID) | ORAL | Status: DC
  Administered 2018-12-05 (×2): 237 mL via ORAL
  Filled 2018-12-04 (×2): qty 237

## 2018-12-04 MED ORDER — HEPARIN SODIUM (PORCINE) 5000 UNIT/ML IJ SOLN
5000.0000 [IU] | Freq: Three times a day (TID) | INTRAMUSCULAR | Status: DC
  Administered 2018-12-04 – 2018-12-05 (×2): 5000 [IU] via SUBCUTANEOUS
  Filled 2018-12-04 (×2): qty 1

## 2018-12-04 MED ORDER — INSULIN ASPART 100 UNIT/ML ~~LOC~~ SOLN
0.0000 [IU] | Freq: Three times a day (TID) | SUBCUTANEOUS | Status: DC
  Administered 2018-12-05: 7 [IU] via SUBCUTANEOUS

## 2018-12-04 NOTE — ED Notes (Signed)
Pt transported to CT ?

## 2018-12-04 NOTE — ED Triage Notes (Signed)
Per [facility]- pt picked up from home due to fall while going to bathroom. Pt also reports generalized weakness and hx of DM. CGB with EMS was 329.  Pt speaks [place].

## 2018-12-04 NOTE — H&P (Addendum)
Date: 12/04/2018               Patient Name:  Brian Owen MRN: [id number]  DOB: 04/04/1951 Age / Sex: 68 y.o., male   PCP: Alfonse Spruce, FNP         Medical Service: Internal Medicine Teaching Service         Attending Physician: Dr. Annia Belt, MD    First Contact: Dr. Donne Hazel Pager: [phone]  Second Contact: Dr. Trilby Drummer Pager: [phone]       After Hours (After 5p/  First Contact Pager: [phone]  weekends / holidays): Second Contact Pager: [phone]   Chief Complaint: fall  History of Present Illness: 68yo [place] male with uncontrolled autoimmune DM, peripheral neuropathy, hyperthyroidism, CKD, HTN, vertigo who presents with recurrent orthostatic dizziness after falling immediately after getting out of bed. This issue has been ongoing since 2017 and he has had multiple in hospital evaluations, most recent of which was Jan 14-16th of this year. At that time, the most likely etiology was autonomic dysfunction and hypovolemia 2/2 uncontrolled diabetes. He was started on midodrine but reports running out of this medication two weeks ago.   Patient endorses significant dizziness when trying to sit up and walk which led to him falling; he has not lost consciousness. He states he has been taking midodrine which helped his symptoms, but he has been out for about 2 weeks. He did not follow up with his PCP after previous hospitalization because he's been "too busy."  Also endorses fever and chills last night and had a week h/o rhinorrhea and sinus pressure which is now resolved.  For his diabetes, he has been taking insulin with last use last night. This morning his CBG was over 300.   He has had no further diarrhea which was an ongoing issue for the patient.   Meds:  Current Meds  Medication Sig  . acetaminophen (TYLENOL) 500 MG tablet Take 500 mg by mouth 2 (two) times daily.  . insulin glargine (LANTUS) 100 UNIT/ML injection Inject 0.06 mLs (6 Units total) into the skin  at bedtime.   Allergies: Allergies as of 12/04/2018  . (No Known Allergies)   Past Medical History:  Diagnosis Date  . Bullae 11/11/2015   legs/notes 11/11/2015  . Chronic kidney disease   . Heavy cigarette smoker   . Parasite infection 2016   "couldn't afford to have surgery done; just left it; lower legs"  . Uncontrolled type II diabetes mellitus ([place]) dx'd 2013   Family History:  Family History  Problem Relation Age of Onset  . Diabetes Other   . Diabetes Other     Social History: lives at home with wife and grandchildren. Supposed to use a walker but doesn't all use it. 72 pack year smoker, quit 2015   Review of Systems: A complete ROS was negative except as per HPI.   Physical Exam: Blood pressure 124/83, pulse 79, resp. rate 16, height 5\' 1"  (1.549 m), weight 50.9 kg, SpO2 99 %. Vitals:   12/04/18 1245 12/04/18 1300 12/04/18 1400 12/04/18 1650  BP:  131/77 124/83 (!) 153/80  Pulse: 66 84 79 89  Resp: 16 (!) 22 16 16   Temp:    97.6 F (36.4 C)  TempSrc:    Oral  SpO2: 95% 96% 99% 98%  Weight:      Height:       General: Vital signs reviewed.  Patient is thin appearing, in no acute distress and  cooperative with exam.  HENT: moist MM, oropharynx without erythema or exudate, no LAD, sinuses are non-tender to palpation. Left cataract. Arcus senilis. PERRL Cardiac: RRR, no m/r/g, no JVD Pulm: CTAB Abd: soft, NT, ND Ext: warm, no LEE  Labs: Wbc 13 hgb 10.9, mcv 90  Na 133 K 4.6 Creatine 2.31 BUN 29 AG 16 CO2 23 Albumin 3.1  Trop < 0.03 Lactate 1.2 Flu neg  UA >500 glucose, moderate hgb, 30 protein, spec grav 1.01. No leuks/nitrite/bacteria   EKG: personally reviewed my interpretation is sinus tachycardia HR 107  CXR: personally reviewed my interpretation is no acute cardiopulmonary disease  CT head, c-spine 1. No evidence of acute intracranial abnormality. 2. Mild chronic small vessel ischemic disease. 3. Acute on chronic pansinusitis. 4. No  evidence of acute fracture or traumatic subluxation in the cervical spine.  Assessment & Plan by Problem: Active Problems:   Orthostatic hypotension  68yo [place] male with uncontrolled autoimmune DM, peripheral neuropathy, hyperthyroidism, CKD, HTN, vertigo who presents with recurrent orthostatic dizziness and fall after running out of his midodrine two weeks ago.   Orthostatic hypotension: Patient with multiple prior admissions for orthostatic hypotension, thought to be due to hypovolemia due to uncontrolled diabetes as well as autonomic orthostasis. He has been endorsing chills and has leukocytosis on labs today, however no other symptoms of infection. --restart midodrine 2.5mg  TID, abdominal binder, compression stockings -- IVFs --F/u BCx --f/u AM bmet and cbc  AKI: Patient with Cr 2.3 on admission; previous possible baseline at 1.5. He has received 2L NS in the ED --f/u AM bmet  Autoimmune, insulin dependent diabetes mellitus: Glucose uncontrolled. --restart lantus 6 units qhs --SSI-S  Elevated protein: total protein 8.7  -- 24hr urine  Hemoglobinuria: moderate hemoglobinuria with only 0-5 RBCs. Consider rhabdomyolysis.  - f/u CK  DVT ppx: heparin Code: full FEN/GI: regular diet, NS 100cc/hr for 10 hrs  Dispo: Admit patient to Observation with expected length of stay less than 2 midnights.  Signed: Isabelle Course, MD 12/04/2018, 3:50 PM  Pager: [phone]

## 2018-12-04 NOTE — ED Provider Notes (Signed)
[facility] EMERGENCY DEPARTMENT Provider Note   CSN: [id number] Arrival date & time: 12/04/18  3536     History   Chief Complaint Chief Complaint  Patient presents with  . Fall    HPI Brian Owen is a 68 y.o. male.  Patient is a 68 year old [place] male who lives at home with extensive past medical history inclusive of uncontrolled type 2 diabetes, CKD, hypertension, vertigo who presents the emergency department for complaints of fall this morning.  Patient has longstanding history of dizziness with standing and walking secondary to elevated blood glucose and hypotension.  He lives at home with his wife and grandchild and is supposed to be using a walker to get around.  He reports that he was not using his walker when he got up this morning and immediately fell. Reports this is usual for him related to his chronic dizziness.  Reports that he has a lot of neck pain but that he did not hit his head.  Denies any pain elsewhere.  Reports that he also began to start to feel ill last night with fever and chills.  Denies any nausea, vomiting, shortness of breath, chest pain, cough, dysuria, abdominal pain.  Virtual interpreter was used to obtain most history as patient speaks little [place].     Past Medical History:  Diagnosis Date  . [place] 11/11/2015   legs/notes 11/11/2015  . Chronic kidney disease   . Heavy cigarette smoker   . Parasite infection 2016   "couldn't afford to have surgery done; just left it; lower legs"  . Uncontrolled type II diabetes mellitus ([place]) dx'd 2013    Patient Active Problem List   Diagnosis Date Noted  . Orthostatic hypertension 11/12/2018  . Diabetic peripheral neuropathy associated with type 2 diabetes mellitus ([place])   . Chronic diarrhea   . Uncontrolled type 2 diabetes mellitus with hyperglycemia ([place])   . Gastroenteritis due to norovirus 01/12/2018  . AKI (acute kidney injury) ([place]) 01/12/2018  . Type II diabetes mellitus with  renal manifestations ([place]) 01/11/2018  . Absent pedal pulses 09/27/2017  . Syncope 01/13/2017  . Postural dizziness with presyncope   . CKD (chronic kidney disease), stage III ([place]) 06/16/2016  . Underweight 01/17/2016  . Autonomic neuropathy due to diabetes ([place]) 11/29/2015  . Hyperglycemia 11/27/2015  . Hypotension 11/27/2015  . Vertigo 11/27/2015  . Orthostatic hypotension 11/27/2015  . SVT (supraventricular tachycardia) ([place]) 11/11/2015  . Diarrhea 08/17/2014  . Protein-calorie malnutrition, severe ([place]) 07/26/2014  . History of tobacco use 06/27/2014  . Edentulism, complete 06/27/2014  . Language barrier to communication 06/27/2014  . HTN (hypertension) 06/27/2014  . Hyponatremia 05/13/2014  . Type 2 diabetes mellitus with hyperglycemia, with long-term current use of insulin ([place]) 05/13/2014    Past Surgical History:  Procedure Laterality Date  . NO PAST SURGERIES          Home Medications    Prior to Admission medications   Medication Sig Start Date End Date Taking? Authorizing Provider  acetaminophen (TYLENOL) 500 MG tablet Take 500 mg by mouth 2 (two) times daily.   Yes [provider]  insulin glargine (LANTUS) 100 UNIT/ML injection Inject 0.06 mLs (6 Units total) into the skin at bedtime. 11/13/18  Yes Dorrell, Andree Elk, MD  ACCU-CHEK SOFTCLIX LANCETS lancets 1 each by Other route 3 (three) times daily. 08/13/18   Hayden Rasmussen, MD  Blood Glucose Monitoring Suppl (ACCU-CHEK AVIVA PLUS) w/Device KIT 1 Device by Does not apply route 3 (  three) times daily after meals. 08/13/18   Hayden Rasmussen, MD  glucose blood (ACCU-CHEK AVIVA PLUS) test strip 1 each by Other route 3 (three) times daily. 08/13/18   Hayden Rasmussen, MD  insulin glargine (LANTUS) 100 UNIT/ML injection Inject 0.06 mLs (6 Units total) into the skin at bedtime. 12/05/18   Isabelle Course, MD  midodrine (PROAMATINE) 2.5 MG tablet Take 1 tablet (2.5 mg total) by mouth 3 (three) times daily  with meals. 11/13/18   Dorrell, Andree Elk, MD  midodrine (PROAMATINE) 2.5 MG tablet Take 1 tablet (2.5 mg total) by mouth 3 (three) times daily with meals. 12/05/18   Isabelle Course, MD    Family History Family History  Problem Relation Age of Onset  . Diabetes Other   . Diabetes Other     Social History Social History   Tobacco Use  . Smoking status: Former Smoker    Packs/day: 1.50    Years: 48.00    Pack years: 72.00    Types: Cigarettes    Last attempt to quit: 2015    Years since quitting: 5.1  . Smokeless tobacco: Never Used  Substance Use Topics  . Alcohol use: No  . Drug use: No     Allergies   Patient has no known allergies.   Review of Systems Review of Systems  Constitutional: Positive for fever. Negative for activity change, appetite change, chills, diaphoresis, fatigue and unexpected weight change.  HENT: Negative.  Negative for ear pain, rhinorrhea and sore throat.   Eyes: Negative for pain and visual disturbance.  Respiratory: Negative for cough and shortness of breath.   Cardiovascular: Negative for chest pain and palpitations.  Gastrointestinal: Negative for abdominal pain, nausea and vomiting.  Genitourinary: Negative for dysuria and hematuria.  Musculoskeletal: Positive for gait problem and neck pain. Negative for arthralgias, back pain, joint swelling, myalgias and neck stiffness.  Skin: Negative for color change and rash.  Neurological: Positive for dizziness. Negative for seizures, syncope, light-headedness and headaches.  Psychiatric/Behavioral: Negative for confusion.  All other systems reviewed and are negative.    Physical Exam Updated Vital Signs BP (!) 143/76 (BP Location: Left Arm)   Pulse (!) 101   Temp 98.4 F (36.9 C) (Oral)   Resp 18   Ht '5\' 1"'  (1.549 m)   Wt 50.9 kg   SpO2 99%   BMI 21.20 kg/m   Physical Exam Vitals signs and nursing note reviewed. Exam conducted with a chaperone present.  Constitutional:      General:  He is not in acute distress.    Appearance: He is not ill-appearing, toxic-appearing or diaphoretic.     Comments: Appears chronically ill and older than stated age  HENT:     Head: Normocephalic and atraumatic.     Jaw: There is normal jaw occlusion. No trismus.     Right Ear: Tympanic membrane normal. No hemotympanum.     Left Ear: Tympanic membrane normal. No hemotympanum.     Nose: Nose normal.     Right Nostril: No epistaxis.     Left Nostril: No epistaxis.     Mouth/Throat:     Mouth: Mucous membranes are moist.     Pharynx: Oropharynx is clear.  Eyes:     Conjunctiva/sclera: Conjunctivae normal.     Pupils: Pupils are equal, round, and reactive to light.  Neck:     Comments: In c-collar Cardiovascular:     Rate and Rhythm: Normal rate and regular rhythm.  Pulmonary:     Effort: Pulmonary effort is normal.     Breath sounds: Normal breath sounds. No wheezing, rhonchi or rales.  Abdominal:     General: Abdomen is flat. Bowel sounds are normal.  Musculoskeletal:     Thoracic back: He exhibits no tenderness, no bony tenderness, no laceration, no pain, no spasm and normal pulse.     Lumbar back: He exhibits no bony tenderness, no laceration, no pain, no spasm and normal pulse.     Right lower leg: No edema.     Left lower leg: No edema.     Comments: No midline spinal tenderness  Skin:    General: Skin is warm.     Capillary Refill: Capillary refill takes less than 2 seconds.  Neurological:     General: No focal deficit present.     Mental Status: He is alert.     Comments: Patient has normal upper and lower extremity strength and sensation and reflexes.  Symmetrical face with no dysarthria  Psychiatric:        Mood and Affect: Mood normal.      ED Treatments / Results  Labs (all labs ordered are listed, but only abnormal results are displayed) Labs Reviewed  COMPREHENSIVE METABOLIC PANEL - Abnormal; Notable for the following components:      Result Value   Sodium  133 (*)    Chloride 94 (*)    Glucose, Bld 276 (*)    BUN 29 (*)    Creatinine, Ser 2.31 (*)    Total Protein 8.7 (*)    Albumin 3.1 (*)    GFR calc non Af Amer 28 (*)    GFR calc Af Amer 33 (*)    Anion gap 16 (*)    All other components within normal limits  CBC WITH DIFFERENTIAL/PLATELET - Abnormal; Notable for the following components:   WBC 13.0 (*)    RBC 3.74 (*)    Hemoglobin 10.9 (*)    HCT 33.7 (*)    Neutro Abs 11.2 (*)    All other components within normal limits  URINALYSIS, ROUTINE W REFLEX MICROSCOPIC - Abnormal; Notable for the following components:   Glucose, UA >=500 (*)    Hgb urine dipstick MODERATE (*)    Protein, ur 30 (*)    All other components within normal limits  CBC - Abnormal; Notable for the following components:   RBC 3.28 (*)    Hemoglobin 9.8 (*)    HCT 29.6 (*)    All other components within normal limits  BASIC METABOLIC PANEL - Abnormal; Notable for the following components:   Glucose, Bld 372 (*)    BUN 25 (*)    Creatinine, Ser 2.05 (*)    GFR calc non Af Amer 33 (*)    GFR calc Af Amer 38 (*)    All other components within normal limits  GLUCOSE, CAPILLARY - Abnormal; Notable for the following components:   Glucose-Capillary 303 (*)    All other components within normal limits  GLUCOSE, CAPILLARY - Abnormal; Notable for the following components:   Glucose-Capillary 339 (*)    All other components within normal limits  URINALYSIS, ROUTINE W REFLEX MICROSCOPIC - Abnormal; Notable for the following components:   Color, Urine STRAW (*)    Glucose, UA >=500 (*)    Hgb urine dipstick SMALL (*)    All other components within normal limits  CULTURE, BLOOD (ROUTINE X 2)  CULTURE, BLOOD (ROUTINE X 2)  TROPONIN I  LACTIC ACID, PLASMA  INFLUENZA PANEL BY PCR (TYPE A & B)  CK  GLUCOSE, CAPILLARY  PROTEIN, URINE, 24 HOUR    EKG EKG Interpretation  Date/Time:  Thursday December 04 2018 09:25:47 EST Ventricular Rate:  107 PR  Interval:    QRS Duration: 135 QT Interval:  362 QTC Calculation: 483 R Axis:   43 Text Interpretation:  Sinus tachycardia Left bundle branch block Baseline wander in lead(s) II Since last tracing rate faster Confirmed by Wandra Arthurs [phone]) on 12/04/2018 12:47:07 PM   Radiology Dg Chest 1 View  Result Date: 12/04/2018 CLINICAL DATA:  Dizziness, fall, type II diabetes mellitus, hypertension, former smoker EXAM: CHEST  1 VIEW COMPARISON:  Portable exam 0950 hours compared to 08/13/2018 FINDINGS: Normal heart size, mediastinal contours, and pulmonary vascularity. Atherosclerotic calcification aorta. Lungs appear minimally hyperaerated with minimal central peribronchial thickening. No acute infiltrate, pleural effusion, or pneumothorax. Bones demineralized. IMPRESSION: No acute abnormalities. Electronically Signed   By: Lavonia Dana M.D.   On: 12/04/2018 10:07   Ct Head Wo Contrast  Result Date: 12/04/2018 CLINICAL DATA:  Fall. Dizziness. Generalized weakness. Initial encounter. EXAM: CT HEAD WITHOUT CONTRAST CT CERVICAL SPINE WITHOUT CONTRAST TECHNIQUE: Multidetector CT imaging of the head and cervical spine was performed following the standard protocol without intravenous contrast. Multiplanar CT image reconstructions of the cervical spine were also generated. COMPARISON:  08/13/2018 FINDINGS: CT HEAD FINDINGS Brain: There is no evidence of acute infarct, intracranial hemorrhage, mass, midline shift, or extra-axial fluid collection. Mild cerebral atrophy is unchanged. Cerebral white matter hypodensities are unchanged and nonspecific but compatible with mild chronic small vessel ischemic disease. Vascular: Calcified atherosclerosis at the skull base. No hyperdense vessel. Skull: No fracture or suspicious osseous lesion. Sinuses/Orbits: Unremarkable orbits. Mucosal thickening and fluid in the left greater than right maxillary sinuses with chronic sinus wall thickening and sclerosis. Left greater than right  frontal sinus fluid. Extensive bilateral ethmoid air cell opacification. Mucosal thickening and small volume fluid in the sphenoid sinuses. Inflammatory sinus disease has progressed from the prior CT. The mastoid air cells are clear. Other: None. CT CERVICAL SPINE FINDINGS Alignment: Unchanged trace anterolisthesis of C4 on C5. Skull base and vertebrae: No acute fracture or suspicious osseous lesion. Moderate median C1-2 spurring. Soft tissues and spinal canal: No prevertebral fluid or swelling. No visible canal hematoma. Disc levels: Minimal cervical spondylosis for age. Asymmetric right-sided cervical facet arthrosis, severe at C4-5 resulting in mild right neural foraminal stenosis. No osseous spinal canal stenosis. Upper chest: Moderate emphysema. Other: Mild calcific atherosclerosis of the carotid arteries. IMPRESSION: 1. No evidence of acute intracranial abnormality. 2. Mild chronic small vessel ischemic disease. 3. Acute on chronic pansinusitis. 4. No evidence of acute fracture or traumatic subluxation in the cervical spine. Electronically Signed   By: Logan Bores M.D.   On: 12/04/2018 10:23   Ct Cervical Spine Wo Contrast  Result Date: 12/04/2018 CLINICAL DATA:  Fall. Dizziness. Generalized weakness. Initial encounter. EXAM: CT HEAD WITHOUT CONTRAST CT CERVICAL SPINE WITHOUT CONTRAST TECHNIQUE: Multidetector CT imaging of the head and cervical spine was performed following the standard protocol without intravenous contrast. Multiplanar CT image reconstructions of the cervical spine were also generated. COMPARISON:  08/13/2018 FINDINGS: CT HEAD FINDINGS Brain: There is no evidence of acute infarct, intracranial hemorrhage, mass, midline shift, or extra-axial fluid collection. Mild cerebral atrophy is unchanged. Cerebral white matter hypodensities are unchanged and nonspecific but compatible with mild chronic small vessel ischemic disease. Vascular: Calcified atherosclerosis  at the skull base. No hyperdense  vessel. Skull: No fracture or suspicious osseous lesion. Sinuses/Orbits: Unremarkable orbits. Mucosal thickening and fluid in the left greater than right maxillary sinuses with chronic sinus wall thickening and sclerosis. Left greater than right frontal sinus fluid. Extensive bilateral ethmoid air cell opacification. Mucosal thickening and small volume fluid in the sphenoid sinuses. Inflammatory sinus disease has progressed from the prior CT. The mastoid air cells are clear. Other: None. CT CERVICAL SPINE FINDINGS Alignment: Unchanged trace anterolisthesis of C4 on C5. Skull base and vertebrae: No acute fracture or suspicious osseous lesion. Moderate median C1-2 spurring. Soft tissues and spinal canal: No prevertebral fluid or swelling. No visible canal hematoma. Disc levels: Minimal cervical spondylosis for age. Asymmetric right-sided cervical facet arthrosis, severe at C4-5 resulting in mild right neural foraminal stenosis. No osseous spinal canal stenosis. Upper chest: Moderate emphysema. Other: Mild calcific atherosclerosis of the carotid arteries. IMPRESSION: 1. No evidence of acute intracranial abnormality. 2. Mild chronic small vessel ischemic disease. 3. Acute on chronic pansinusitis. 4. No evidence of acute fracture or traumatic subluxation in the cervical spine. Electronically Signed   By: Logan Bores M.D.   On: 12/04/2018 10:23    Procedures Procedures (including critical care time)  Medications Ordered in ED Medications  midodrine (PROAMATINE) tablet 2.5 mg (2.5 mg Oral Given 12/05/18 1054)  insulin aspart (novoLOG) injection 0-9 Units (0 Units Subcutaneous Not Given 12/05/18 1121)  heparin injection 5,000 Units (5,000 Units Subcutaneous Given 12/05/18 0644)  acetaminophen (TYLENOL) tablet 650 mg (has no administration in time range)    Or  acetaminophen (TYLENOL) suppository 650 mg (has no administration in time range)  feeding supplement (GLUCERNA SHAKE) (GLUCERNA SHAKE) liquid 237 mL (237 mLs  Oral Given 12/05/18 1020)  0.9 %  sodium chloride infusion ( Intravenous New Bag/Given 12/04/18 1828)  insulin glargine (LANTUS) injection 9 Units (has no administration in time range)  sodium chloride 0.9 % bolus 1,000 mL (0 mLs Intravenous Stopped 12/04/18 1145)  acetaminophen (TYLENOL) tablet 650 mg (650 mg Oral Given 12/04/18 1115)  sodium chloride 0.9 % bolus 1,000 mL (0 mLs Intravenous Stopped 12/04/18 1454)  midodrine (PROAMATINE) tablet 2.5 mg (2.5 mg Oral Given 12/04/18 1454)  sodium chloride 0.9 % bolus 1,000 mL (1,000 mLs Intravenous New Bag/Given 12/05/18 1058)     Initial Impression / Assessment and Plan / ED Course  I have reviewed the triage vital signs and the nursing notes.  Pertinent labs & imaging results that were available during my care of the patient were reviewed by me and considered in my medical decision making (see chart for details).  Clinical Course as of Dec 05 1318  Thu Dec 04, 2018  1017 Case discussed with Dr. Darl Householder and plan agreed upon. Patient with history of orthostatic dizziness/syncope who had had multiple admissions and extensive work-up in the past, most likely diagnosis is autonomic dysfunction and hypovolemia secondary to uncontrolled diabetes. Here for the same, resulting in a fall with neck pain. No syncope. Also felt "sick" last night and had subjective fever. Doing septic workup and imaging of head and c spine.    [KM]  Fri Dec 05, 2018  1319 No source of infection identified. Likely viral URI. Patient remained orthostatic and dizzy and unable to walk longer than 108f after 2 liters and midodrine, consult for admission   [KM]    Clinical Course User Index [KM] MAlveria Apley PA-C     Clinical Impression: 1. Dizziness and giddiness  This note was prepared with assistance of Systems analyst. Occasional wrong-word or sound-a-like substitutions may have occurred due to the inherent limitations of voice recognition  software.   Final Clinical Impressions(s) / ED Diagnoses   Final diagnoses:  Dizziness and giddiness    ED Discharge Orders         Ordered    midodrine (PROAMATINE) 2.5 MG tablet  3 times daily with meals     12/05/18 0657    insulin glargine (LANTUS) 100 UNIT/ML injection  Daily at bedtime     12/05/18 0657           Alveria Apley, PA-C 12/05/18 1320    Drenda Freeze, MD 12/06/18 [phone]

## 2018-12-04 NOTE — ED Notes (Signed)
Pt ambulated about 108feet, then became dizzy with unsteady gait. RN ambulated Pt back to bed. MD made aware

## 2018-12-05 ENCOUNTER — Other Ambulatory Visit: Payer: Self-pay

## 2018-12-05 DIAGNOSIS — E0590 Thyrotoxicosis, unspecified without thyrotoxic crisis or storm: Secondary | ICD-10-CM | POA: Diagnosis not present

## 2018-12-05 DIAGNOSIS — Z794 Long term (current) use of insulin: Secondary | ICD-10-CM

## 2018-12-05 DIAGNOSIS — I951 Orthostatic hypotension: Secondary | ICD-10-CM | POA: Diagnosis not present

## 2018-12-05 DIAGNOSIS — R823 Hemoglobinuria: Secondary | ICD-10-CM | POA: Diagnosis not present

## 2018-12-05 DIAGNOSIS — R7989 Other specified abnormal findings of blood chemistry: Secondary | ICD-10-CM | POA: Diagnosis not present

## 2018-12-05 DIAGNOSIS — K529 Noninfective gastroenteritis and colitis, unspecified: Secondary | ICD-10-CM | POA: Diagnosis not present

## 2018-12-05 DIAGNOSIS — R296 Repeated falls: Secondary | ICD-10-CM

## 2018-12-05 DIAGNOSIS — E1122 Type 2 diabetes mellitus with diabetic chronic kidney disease: Secondary | ICD-10-CM | POA: Diagnosis not present

## 2018-12-05 DIAGNOSIS — I129 Hypertensive chronic kidney disease with stage 1 through stage 4 chronic kidney disease, or unspecified chronic kidney disease: Secondary | ICD-10-CM | POA: Diagnosis not present

## 2018-12-05 DIAGNOSIS — Z9114 Patient's other noncompliance with medication regimen: Secondary | ICD-10-CM

## 2018-12-05 DIAGNOSIS — N179 Acute kidney failure, unspecified: Secondary | ICD-10-CM

## 2018-12-05 DIAGNOSIS — D6489 Other specified anemias: Secondary | ICD-10-CM | POA: Diagnosis not present

## 2018-12-05 DIAGNOSIS — D649 Anemia, unspecified: Secondary | ICD-10-CM

## 2018-12-05 DIAGNOSIS — E1143 Type 2 diabetes mellitus with diabetic autonomic (poly)neuropathy: Secondary | ICD-10-CM

## 2018-12-05 DIAGNOSIS — Z9181 History of falling: Secondary | ICD-10-CM

## 2018-12-05 DIAGNOSIS — Z87891 Personal history of nicotine dependence: Secondary | ICD-10-CM

## 2018-12-05 DIAGNOSIS — N189 Chronic kidney disease, unspecified: Secondary | ICD-10-CM | POA: Diagnosis not present

## 2018-12-05 LAB — CBC
HCT: 29.6 % — ABNORMAL LOW (ref 39.0–52.0)
Hemoglobin: 9.8 g/dL — ABNORMAL LOW (ref 13.0–17.0)
MCH: 29.9 pg (ref 26.0–34.0)
MCHC: 33.1 g/dL (ref 30.0–36.0)
MCV: 90.2 fL (ref 80.0–100.0)
Platelets: 237 10*3/uL (ref 150–400)
RBC: 3.28 MIL/uL — ABNORMAL LOW (ref 4.22–5.81)
RDW: 12.1 % (ref 11.5–15.5)
WBC: 8.3 10*3/uL (ref 4.0–10.5)
nRBC: 0 % (ref 0.0–0.2)

## 2018-12-05 LAB — URINALYSIS, ROUTINE W REFLEX MICROSCOPIC
Bacteria, UA: NONE SEEN
Bilirubin Urine: NEGATIVE
Glucose, UA: >=500 mg/dL — AB
Ketones, ur: NEGATIVE mg/dL
Leukocytes, UA: NEGATIVE
Nitrite: NEGATIVE
Protein, ur: NEGATIVE mg/dL
Specific Gravity, Urine: 1.012 (ref 1.005–1.030)
pH: 6 (ref 5.0–8.0)

## 2018-12-05 LAB — GLUCOSE, CAPILLARY
Glucose-Capillary: 339 mg/dL — ABNORMAL HIGH (ref 70–99)
Glucose-Capillary: 71 mg/dL (ref 70–99)

## 2018-12-05 LAB — BASIC METABOLIC PANEL
Anion gap: 11 (ref 5–15)
BUN: 25 mg/dL — ABNORMAL HIGH (ref 8–23)
CO2: 24 mmol/L (ref 22–32)
Calcium: 8.9 mg/dL (ref 8.9–10.3)
Chloride: 100 mmol/L (ref 98–111)
Creatinine, Ser: 2.05 mg/dL — ABNORMAL HIGH (ref 0.61–1.24)
GFR calc non Af Amer: 33 mL/min — ABNORMAL LOW (ref >60)
GFR, EST AFRICAN AMERICAN: 38 mL/min — AB (ref >60)
Glucose, Bld: 372 mg/dL — ABNORMAL HIGH (ref 70–99)
Potassium: 4.5 mmol/L (ref 3.5–5.1)
Sodium: 135 mmol/L (ref 135–145)

## 2018-12-05 LAB — PROTEIN, URINE, 24 HOUR
Collection Interval-UPROT: 24 hours
Protein, 24H Urine: 540 mg/d — ABNORMAL HIGH (ref 50–100)
Protein, Urine: 36 mg/dL
Urine Total Volume-UPROT: 1500 mL

## 2018-12-05 MED ORDER — INSULIN GLARGINE 100 UNIT/ML ~~LOC~~ SOLN: 6.0000 [IU] | Freq: Every day | SUBCUTANEOUS | 0 refills | Status: DC

## 2018-12-05 MED ORDER — MIDODRINE HCL 2.5 MG PO TABS: 2.5000 mg | ORAL_TABLET | Freq: Three times a day (TID) | ORAL | 0 refills | Status: DC

## 2018-12-05 MED ORDER — INSULIN GLARGINE 100 UNIT/ML ~~LOC~~ SOLN: 9.0000 [IU] | Freq: Every day | SUBCUTANEOUS | 11 refills | Status: DC

## 2018-12-05 MED ORDER — MIDODRINE HCL 2.5 MG PO TABS: 2.5000 mg | ORAL_TABLET | Freq: Three times a day (TID) | ORAL | 11 refills | Status: DC

## 2018-12-05 MED ORDER — INSULIN GLARGINE 100 UNIT/ML ~~LOC~~ SOLN
9.0000 [IU] | Freq: Every day | SUBCUTANEOUS | Status: DC
  Filled 2018-12-05: qty 0.09

## 2018-12-05 MED ORDER — SODIUM CHLORIDE 0.9 % IV BOLUS
1000.0000 mL | Freq: Once | INTRAVENOUS | Status: AC
  Administered 2018-12-05: 1000 mL via INTRAVENOUS

## 2018-12-05 MED ORDER — SODIUM CHLORIDE 0.9 % IV SOLN
INTRAVENOUS | Status: DC
  Administered 2018-12-05: 11:00:00 via INTRAVENOUS

## 2018-12-05 MED ORDER — SODIUM CHLORIDE 0.9 % IV SOLN: INTRAVENOUS | Status: DC

## 2018-12-05 MED FILL — MIDODRINE HCL 5 MG TABLET: 5 | 30 days supply | Qty: 45 | Fill #0

## 2018-12-05 NOTE — Progress Notes (Signed)
Pt completed 1 liter NS bolus, orthostatic vitals repeated, again pt has a decrease in BP with position change.  However, pt is non symptomatic at this time.  Resident IM Donne Hazel notified.

## 2018-12-05 NOTE — Progress Notes (Addendum)
   Subjective: No complaints this morning. Has walked to bathroom without dizziness or lightheadedness. When asked why he didn't pick up his midodrine after his previous hospitalization he said, "I don't know what that medication is for." We explained the indication and importance of that medicine and he said, "Yes, I would like more of that medicine sent to my pharmacy." He states that he missed his f/u appts because he didn't have anyone who could transport him. We will reach out to social work and inquire about transportation resources.   Objective:  Vital signs in last 24 hours: Vitals:   12/04/18 1400 12/04/18 1650 12/04/18 2240 12/05/18 0520  BP: 124/83 (!) 153/80 138/81 133/77  Pulse: 79 89 72 76  Resp: 16 16 16 16   Temp:  97.6 F (36.4 C) 97.9 F (36.6 C) 98.6 F (37 C)  TempSrc:  Oral Oral Oral  SpO2: 99% 98% 99% 98%  Weight:      Height:       Gen: sitting up in bed, NAD, alert and interactive Pulm: CTAB Cardiac: RRR, no m/r/g, no carotid bruit  Assessment/Plan:  Active Problems:   Orthostatic hypotension   68yo [place] male with uncontrolled autoimmune DM, peripheral neuropathy, hyperthyroidism, CKD, HTN, vertigo who presents with recurrent orthostatic dizziness and fall after running out of his midodrine two weeks ago.   Orthostatic hypotension: Improved after IVFs and reinitiating midodrine. Some concern for infection given leukocytosis on admission but that has resolved this morning.  - repeat orthostatic vital signs are still positive - 1L NS bolus - continue midodrine 2.5mg  TID - he never picked up midodrine from the pharmacy because he didn't know what the medication was for. Re-educated on purpose and medicine. Will get TOC pharmacy to deliver meds to bed prior to discharge - compression stockings (patient states he wears these at home) - he does not have an abdominal binder at home   AKI: previous possible baseline creatine 1.5 - improved from 2.3 to  2.0 overnight with 3L IVFs - IVF bolus as above  - repeat BMP tomorrow    Autoimmune, insulin dependent diabetes mellitus: CBGs remain uncontrolled.  - He reports compliance with lantus 6U qhs and we do have record that it was recently filled at his pharmacy -increase lantus to 9U qhs -SSI-S  Elevated protein: total protein 8.7  -- continue 24hr urine collection, scheduled to end at 1AM on 2/8  Hemoglobinuria: moderate hemoglobinuria with only 0-5 RBCs. Consider rhabdomyolysis.  - CK normal  - possibly transient, repeat UA   Dispo: Anticipated discharge in approximately 1 day. Will need midodrine delivered to bedside. Also consulted SW for transportation resources in the outpt setting   Isabelle Course, MD 12/05/2018, 10:04 AM Pager: [phone]

## 2018-12-05 NOTE — Progress Notes (Signed)
Client educated on how to collect 24 hour urine collection. Client advised to urinate in urinal for collection and notify staff to transfer urine into collection container. Client 24 hour urine collection started at 0100. Client needs frequent remainders.

## 2018-12-05 NOTE — Progress Notes (Signed)
Discharge instructions (including medications) discussed with and copy provided to patient/caregiver.  Pt along with family verbalizes understanding of d/c instructions.

## 2018-12-05 NOTE — Discharge Summary (Signed)
Name: Brian Owen MRN: [id number] DOB: 02/28/1951 68 y.o. PCP: Alfonse Spruce, FNP  Date of Admission: 12/04/2018  9:21 AM Date of Discharge:  Attending Physician: Annia Belt, MD  Discharge Diagnosis: 1. Orthostatic Hypotension  2. Uncontrolled autoimmune diabetes  Discharge Medications: Allergies as of 12/05/2018   No Known Allergies     Medication List    TAKE these medications   ACCU-CHEK AVIVA PLUS w/Device Kit 1 Device by Does not apply route 3 (three) times daily after meals.   ACCU-CHEK SOFTCLIX LANCETS lancets 1 each by Other route 3 (three) times daily.   acetaminophen 500 MG tablet Commonly known as:  TYLENOL Take 500 mg by mouth 2 (two) times daily.   glucose blood test strip Commonly known as:  ACCU-CHEK AVIVA PLUS 1 each by Other route 3 (three) times daily.   insulin glargine 100 UNIT/ML injection Commonly known as:  LANTUS Inject 0.09 mLs (9 Units total) into the skin at bedtime. What changed:  how much to take   midodrine 2.5 MG tablet Commonly known as:  PROAMATINE Take 1 tablet (2.5 mg total) by mouth 3 (three) times daily with meals.       Disposition and follow-up:   Brian Owen was discharged from [facility] in Good condition.  At the hospital follow up visit please address:  1.  Make sure he is taking midodrine 2.73m TID. He was given a one month supply at bedside.   Lantus was increased from 6U to 9U qhs. Low health literacy. Please titrate further as needed. His CBGs are chronically uncontrolled. Last a1c 12.7  AKI on admission, trending down with fluids. Encourage po intake   2.  Labs / imaging needed at time of follow-up: CBG, BMP  3.  Pending labs/ test needing follow-up: none  Follow-up Appointments: Follow-up Information    HAlfonse Spruce FNP. Schedule an appointment as soon as possible for a visit in 1 week(s).   Specialty:  Family Medicine Contact information: [id number] uncontrolled[place]immuneDM,peripheral neuropathy, hyperthyroidism, CKD, HTN, vertigo who presents with recurrent orthostatic dizzinessand fall after running out of his midodrine two weeks ago.  Orthostatic Hypotension: Due to autonomic dysfunction from poorly controlled diabetes and hypovolemia 2/2 glucosuria. Improved with IVFs and re-starting midodrine. After last hospitalization, he never picked up midodrine from the pharmacy because he didn't know what it was for. He also didn't go to his hospital f/u due to lack of transportation. He was advised to continue wearing compression stockings at home.   Autoimmune, insulin dependent diabetes mellitus: CBGs remain uncontrolled. Last a1c 12.7. He reports compliance with lantus 6U qhs and we do have record that it was recently filled at his pharmacy. CBGs were in the 300s. Discharged on increased lantus of 9U qhs   Discharge Vitals:   BP (!) 143/76 (BP Location: Left Arm)   Pulse 93   Temp 98.4 F (36.9 C) (Oral)   Resp 18   Ht '5\' 1"'  (1.549 m)   Wt 50.9 kg   SpO2 99%   BMI 21.20 kg/m   BP Readings from Last 3 Encounters:  12/05/18 (!) 143/76  11/23/18 139/81  11/13/18 (!) 196/88   Orthostatic VS for the past 24 hrs (Last 3 readings):  BP- Lying Pulse- Lying BP- Sitting Pulse- Sitting BP- Standing at 0 minutes Pulse- Standing  at 0 minutes BP- Standing at 3 minutes Pulse- Standing at 3 minutes  12/05/18 1400 - - - - - - (!) 86/57 93  12/05/18 1357 - - - - 92/58 85 - -  12/05/18 1355 - - 128/79 86 - - - -  12/05/18 1353 (!) 169/91 83 - - - - - -  12/05/18 1011 - - - - (!) 75/52 96 - -  12/05/18 1008 - - (!) 86/66 84 - - - -  12/05/18 1006 106/66 75 - - - - - -  12/05/18 0949 106/66 75 (!) 86/66 84 (!) 75/52 96 105/66 88     Pertinent Labs, Studies, and Procedures:   BMP Latest Ref Rng & Units 12/05/2018 12/04/2018  11/23/2018  Glucose 70 - 99 mg/dL 372(H) 276(H) 271(H)  BUN 8 - 23 mg/dL 25(H) 29(H) 29(H)  Creatinine 0.61 - 1.24 mg/dL 2.05(H) 2.31(H) 2.03(H)  BUN/Creat Ratio 10 - 24 - - -  Sodium 135 - 145 mmol/L 135 133(L) 137  Potassium 3.5 - 5.1 mmol/L 4.5 4.6 4.0  Chloride 98 - 111 mmol/L 100 94(L) 109  CO2 22 - 32 mmol/L 24 23 20(L)  Calcium 8.9 - 10.3 mg/dL 8.9 9.5 8.4(L)     Discharge Instructions: Discharge Instructions    Diet - low sodium heart healthy   Complete by:  As directed    Discharge instructions   Complete by:  As directed    Please take midodrine three times a day. This medicine will help keep your blood pressure up so you don't get dizzy when you stand up or walk around.   Please increase your insulin (Lantus) to 9 units every night.   Please follow up with your regular doctor in one week to make sure your blood sugars and diabetes is better and to make sure your blood pressure is better and you are not feeling dizzy.   Increase activity slowly   Complete by:  As directed       Signed: Neva Seat, MD 12/05/2018, 3:46 PM   Pager: [phone]

## 2018-12-05 NOTE — Plan of Care (Signed)
  Problem: Education: Goal: Knowledge of General Education information will improve Description: Including pain rating scale, medication(s)/side effects and non-pharmacologic comfort measures Outcome: Progressing   Problem: Health Behavior/Discharge Planning: Goal: Ability to manage health-related needs will improve Outcome: Progressing   Problem: Safety: Goal: Ability to remain free from injury will improve Outcome: Progressing   

## 2018-12-05 NOTE — Progress Notes (Signed)
Family (Wife) updated as to patient's status and discharge. Wife states she will be here as soon as possible.

## 2018-12-09 LAB — CULTURE, BLOOD (ROUTINE X 2)
Culture: NO GROWTH
Culture: NO GROWTH
Special Requests: ADEQUATE

## 2018-12-11 ENCOUNTER — Emergency Department (HOSPITAL_COMMUNITY)
Admission: EM | Admit: 2018-12-11 | Discharge: 2018-12-11 | Disposition: A | Discharge status: Home / Self Care | Payer: Medicare Other | Attending: Emergency Medicine | Admitting: Emergency Medicine

## 2018-12-11 ENCOUNTER — Emergency Department (HOSPITAL_COMMUNITY): Payer: Medicare Other

## 2018-12-11 ENCOUNTER — Other Ambulatory Visit: Payer: Self-pay

## 2018-12-11 DIAGNOSIS — Z79899 Other long term (current) drug therapy: Secondary | ICD-10-CM | POA: Diagnosis present

## 2018-12-11 DIAGNOSIS — E1165 Type 2 diabetes mellitus with hyperglycemia: Secondary | ICD-10-CM | POA: Diagnosis not present

## 2018-12-11 DIAGNOSIS — I951 Orthostatic hypotension: Secondary | ICD-10-CM | POA: Insufficient documentation

## 2018-12-11 DIAGNOSIS — W19XXXA Unspecified fall, initial encounter: Secondary | ICD-10-CM

## 2018-12-11 DIAGNOSIS — I130 Hypertensive heart and chronic kidney disease with heart failure and stage 1 through stage 4 chronic kidney disease, or unspecified chronic kidney disease: Secondary | ICD-10-CM | POA: Diagnosis not present

## 2018-12-11 DIAGNOSIS — Z87891 Personal history of nicotine dependence: Secondary | ICD-10-CM | POA: Diagnosis not present

## 2018-12-11 DIAGNOSIS — S79922A Unspecified injury of left thigh, initial encounter: Secondary | ICD-10-CM | POA: Diagnosis not present

## 2018-12-11 DIAGNOSIS — I509 Heart failure, unspecified: Secondary | ICD-10-CM | POA: Diagnosis not present

## 2018-12-11 DIAGNOSIS — M79651 Pain in right thigh: Secondary | ICD-10-CM | POA: Diagnosis not present

## 2018-12-11 DIAGNOSIS — N183 Chronic kidney disease, stage 3 (moderate): Secondary | ICD-10-CM | POA: Insufficient documentation

## 2018-12-11 DIAGNOSIS — S79921A Unspecified injury of right thigh, initial encounter: Secondary | ICD-10-CM | POA: Diagnosis not present

## 2018-12-11 DIAGNOSIS — E1122 Type 2 diabetes mellitus with diabetic chronic kidney disease: Secondary | ICD-10-CM | POA: Insufficient documentation

## 2018-12-11 DIAGNOSIS — I10 Essential (primary) hypertension: Secondary | ICD-10-CM | POA: Diagnosis not present

## 2018-12-11 DIAGNOSIS — R42 Dizziness and giddiness: Secondary | ICD-10-CM | POA: Diagnosis not present

## 2018-12-11 DIAGNOSIS — R739 Hyperglycemia, unspecified: Secondary | ICD-10-CM

## 2018-12-11 DIAGNOSIS — M79652 Pain in left thigh: Secondary | ICD-10-CM | POA: Diagnosis not present

## 2018-12-11 DIAGNOSIS — R52 Pain, unspecified: Secondary | ICD-10-CM | POA: Diagnosis not present

## 2018-12-11 LAB — HEPATIC FUNCTION PANEL
ALT: 16 U/L (ref 0–44)
AST: 14 U/L — ABNORMAL LOW (ref 15–41)
Albumin: 3.3 g/dL — ABNORMAL LOW (ref 3.5–5.0)
Alkaline Phosphatase: 71 U/L (ref 38–126)
Bilirubin, Direct: 0.1 mg/dL (ref 0.0–0.2)
Indirect Bilirubin: 0.5 mg/dL (ref 0.3–0.9)
Total Bilirubin: 0.6 mg/dL (ref 0.3–1.2)
Total Protein: 8.4 g/dL — ABNORMAL HIGH (ref 6.5–8.1)

## 2018-12-11 LAB — BASIC METABOLIC PANEL
Anion gap: 10 (ref 5–15)
BUN: 38 mg/dL — ABNORMAL HIGH (ref 8–23)
CALCIUM: 9.2 mg/dL (ref 8.9–10.3)
CO2: 26 mmol/L (ref 22–32)
Chloride: 97 mmol/L — ABNORMAL LOW (ref 98–111)
Creatinine, Ser: 2.35 mg/dL — ABNORMAL HIGH (ref 0.61–1.24)
GFR calc Af Amer: 32 mL/min — ABNORMAL LOW (ref >60)
GFR calc non Af Amer: 28 mL/min — ABNORMAL LOW (ref >60)
Glucose, Bld: 353 mg/dL — ABNORMAL HIGH (ref 70–99)
Potassium: 4.5 mmol/L (ref 3.5–5.1)
SODIUM: 133 mmol/L — AB (ref 135–145)

## 2018-12-11 LAB — URINALYSIS, ROUTINE W REFLEX MICROSCOPIC
Bilirubin Urine: NEGATIVE
Glucose, UA: >=500 mg/dL — AB
Ketones, ur: NEGATIVE mg/dL
Leukocytes,Ua: NEGATIVE
NITRITE: NEGATIVE
Protein, ur: 30 mg/dL — AB
Specific Gravity, Urine: 1.013 (ref 1.005–1.030)
pH: 5 (ref 5.0–8.0)

## 2018-12-11 LAB — CBC
HCT: 34 % — ABNORMAL LOW (ref 39.0–52.0)
Hemoglobin: 10.8 g/dL — ABNORMAL LOW (ref 13.0–17.0)
MCH: 29.3 pg (ref 26.0–34.0)
MCHC: 31.8 g/dL (ref 30.0–36.0)
MCV: 92.4 fL (ref 80.0–100.0)
Platelets: 291 10*3/uL (ref 150–400)
RBC: 3.68 MIL/uL — ABNORMAL LOW (ref 4.22–5.81)
RDW: 12.4 % (ref 11.5–15.5)
WBC: 12.3 10*3/uL — AB (ref 4.0–10.5)
nRBC: 0 % (ref 0.0–0.2)

## 2018-12-11 LAB — BLOOD GAS, VENOUS
ACID-BASE DEFICIT: 1.3 mmol/L (ref 0.0–2.0)
Bicarbonate: 23.1 mmol/L (ref 20.0–28.0)
O2 Saturation: 87 %
Patient temperature: 37
pCO2, Ven: 40.3 mmHg — ABNORMAL LOW (ref 44.0–60.0)
pH, Ven: 7.37 (ref 7.250–7.430)
pO2, Ven: 52.5 mmHg — ABNORMAL HIGH (ref 32.0–45.0)

## 2018-12-11 LAB — LIPASE, BLOOD: LIPASE: 47 U/L (ref 11–51)

## 2018-12-11 LAB — CBG MONITORING, ED: Glucose-Capillary: 323 mg/dL — ABNORMAL HIGH (ref 70–99)

## 2018-12-11 MED ORDER — SODIUM CHLORIDE 0.9 % IV BOLUS
1000.0000 mL | Freq: Once | INTRAVENOUS | Status: AC
  Administered 2018-12-11: 1000 mL via INTRAVENOUS

## 2018-12-11 MED ORDER — SODIUM CHLORIDE 0.9 % IV BOLUS: 1000.0000 mL | Freq: Once | INTRAVENOUS | Status: DC

## 2018-12-11 NOTE — ED Notes (Signed)
Bed: OH00 Expected date:  Expected time:  Means of arrival:  Comments: EMS fall, dizziness, FSBG 457

## 2018-12-11 NOTE — ED Triage Notes (Signed)
Per PTAR: Pt is coming from home with a complaint of fall and hyperglycemia. Pt reportedly got up from the toilet and lost his balance. Pt reports he fell but did not hit his head or have LOC. Pt is reportedly non-compliant with his diabetes management.  Pt is from [place]

## 2018-12-11 NOTE — ED Provider Notes (Signed)
[facility] DEPT Provider Note   CSN: [id number] Arrival date & time: 12/11/18  1110     History   Chief Complaint Chief Complaint  Patient presents with  . Fall  . Hyperglycemia    HPI Brian Owen is a 67 y.o. male.  The history is provided by the patient and medical records. No language interpreter was used.  Fall  This is a recurrent problem. The current episode started less than 1 hour ago. The problem occurs daily. The problem has not changed since onset.Pertinent negatives include no chest pain, no abdominal pain, no headaches and no shortness of breath. The symptoms are aggravated by standing. Nothing relieves the symptoms. He has tried nothing for the symptoms. The treatment provided no relief.    Past Medical History:  Diagnosis Date  . Bullae 11/11/2015   legs/notes 11/11/2015  . Chronic kidney disease   . Heavy cigarette smoker   . Parasite infection 2016   "couldn't afford to have surgery done; just left it; lower legs"  . Uncontrolled type II diabetes mellitus ([place]) dx'd 2013    Patient Active Problem List   Diagnosis Date Noted  . Normochromic anemia   . Orthostatic hypertension 11/12/2018  . Diabetic peripheral neuropathy associated with type 2 diabetes mellitus ([place])   . Chronic diarrhea   . Uncontrolled type 2 diabetes mellitus with hyperglycemia ([place])   . Gastroenteritis due to norovirus 01/12/2018  . AKI (acute kidney injury) ([place]) 01/12/2018  . Type II diabetes mellitus with renal manifestations ([place]) 01/11/2018  . Absent pedal pulses 09/27/2017  . Syncope 01/13/2017  . Postural dizziness with presyncope   . CKD (chronic kidney disease) stage 3, GFR 30-59 ml/min (H[facility]) 06/16/2016  . Underweight 01/17/2016  . Autonomic neuropathy due to secondary diabetes ([place]) 11/29/2015  . Hyperglycemia 11/27/2015  . Hypotension 11/27/2015  . Vertigo 11/27/2015  . Orthostatic hypotension 11/27/2015  . SVT (supraventricular  tachycardia) ([place]) 11/11/2015  . Diarrhea 08/17/2014  . Protein-calorie malnutrition, severe ([place]) 07/26/2014  . History of tobacco use 06/27/2014  . Edentulism, complete 06/27/2014  . Language barrier to communication 06/27/2014  . HTN (hypertension) 06/27/2014  . Hyponatremia 05/13/2014  . Type 2 diabetes mellitus with hyperglycemia, with long-term current use of insulin ([place]) 05/13/2014    Past Surgical History:  Procedure Laterality Date  . NO PAST SURGERIES          Home Medications    Prior to Admission medications   Medication Sig Start Date End Date Taking? Authorizing Provider  ACCU-CHEK SOFTCLIX LANCETS lancets 1 each by Other route 3 (three) times daily. 08/13/18   Hayden Rasmussen, MD  acetaminophen (TYLENOL) 500 MG tablet Take 500 mg by mouth 2 (two) times daily.    [provider]  Blood Glucose Monitoring Suppl (ACCU-CHEK AVIVA PLUS) w/Device KIT 1 Device by Does not apply route 3 (three) times daily after meals. 08/13/18   Hayden Rasmussen, MD  glucose blood (ACCU-CHEK AVIVA PLUS) test strip 1 each by Other route 3 (three) times daily. 08/13/18   Hayden Rasmussen, MD  insulin glargine (LANTUS) 100 UNIT/ML injection Inject 0.09 mLs (9 Units total) into the skin at bedtime. 12/05/18   Neva Seat, MD  midodrine (PROAMATINE) 2.5 MG tablet Take 1 tablet (2.5 mg total) by mouth 3 (three) times daily with meals. 12/05/18   Neva Seat, MD    Family History Family History  Problem Relation Age of Onset  . Diabetes Other   . Diabetes  Other     Social History Social History   Tobacco Use  . Smoking status: Former Smoker    Packs/day: 1.50    Years: 48.00    Pack years: 72.00    Types: Cigarettes    Last attempt to quit: 2015    Years since quitting: 5.1  . Smokeless tobacco: Never Used  Substance Use Topics  . Alcohol use: No  . Drug use: No     Allergies   Patient has no known allergies.   Review of Systems Review of Systems    Constitutional: Negative for chills, diaphoresis, fatigue and fever.  HENT: Negative for congestion.   Eyes: Negative for visual disturbance.  Respiratory: Negative for cough, chest tightness, shortness of breath and wheezing.   Cardiovascular: Negative for chest pain, palpitations and leg swelling.  Gastrointestinal: Negative for abdominal pain, constipation, diarrhea, nausea and vomiting.  Genitourinary: Negative for flank pain.  Musculoskeletal: Negative for back pain, neck pain and neck stiffness.  Skin: Negative for rash and wound.  Neurological: Negative for dizziness, syncope, weakness, light-headedness and headaches.  Psychiatric/Behavioral: Negative for agitation.  All other systems reviewed and are negative.    Physical Exam Updated Vital Signs BP (!) 143/83 (BP Location: Left Arm)   Pulse 83   Temp 97.9 F (36.6 C) (Oral)   Resp 16   SpO2 100%   Physical Exam Vitals signs and nursing note reviewed.  Constitutional:      General: He is not in acute distress.    Appearance: He is well-developed. He is not ill-appearing, toxic-appearing or diaphoretic.  HENT:     Head: Normocephalic and atraumatic.     Nose: No congestion or rhinorrhea.     Mouth/Throat:     Pharynx: No oropharyngeal exudate or posterior oropharyngeal erythema.  Eyes:     Conjunctiva/sclera: Conjunctivae normal.     Pupils: Pupils are equal, round, and reactive to light.  Neck:     Musculoskeletal: Neck supple. No muscular tenderness.  Cardiovascular:     Rate and Rhythm: Normal rate and regular rhythm.     Heart sounds: No murmur.  Pulmonary:     Effort: Pulmonary effort is normal. No respiratory distress.     Breath sounds: Normal breath sounds. No wheezing, rhonchi or rales.  Chest:     Chest wall: No tenderness.  Abdominal:     General: There is no distension.     Palpations: Abdomen is soft.     Tenderness: There is no abdominal tenderness. There is no right CVA tenderness, left CVA  tenderness or rebound.  Musculoskeletal:        General: Tenderness present. No swelling or deformity.     Right upper leg: He exhibits tenderness. He exhibits no deformity and no laceration.     Left upper leg: He exhibits tenderness. He exhibits no deformity and no laceration.     Right lower leg: No edema.     Left lower leg: No edema.       Legs:  Skin:    General: Skin is warm and dry.     Capillary Refill: Capillary refill takes less than 2 seconds.     Coloration: Skin is not pale.  Neurological:     General: No focal deficit present.     Mental Status: He is alert.     GCS: GCS eye subscore is 4. GCS verbal subscore is 5. GCS motor subscore is 6.     Sensory: No sensory deficit.  Motor: No weakness or abnormal muscle tone.     Comments: Normal sensation strength and pulses in bilateral lower extremities.  Tenderness in the anterior thighs bilaterally.  No hip tenderness or pelvic tenderness.  No back or chest tenderness.  Psychiatric:        Mood and Affect: Mood normal.      ED Treatments / Results  Labs (all labs ordered are listed, but only abnormal results are displayed) Labs Reviewed  BASIC METABOLIC PANEL - Abnormal; Notable for the following components:      Result Value   Sodium 133 (*)    Chloride 97 (*)    Glucose, Bld 353 (*)    BUN 38 (*)    Creatinine, Ser 2.35 (*)    GFR calc non Af Amer 28 (*)    GFR calc Af Amer 32 (*)    All other components within normal limits  CBC - Abnormal; Notable for the following components:   WBC 12.3 (*)    RBC 3.68 (*)    Hemoglobin 10.8 (*)    HCT 34.0 (*)    All other components within normal limits  URINALYSIS, ROUTINE W REFLEX MICROSCOPIC - Abnormal; Notable for the following components:   Glucose, UA >=500 (*)    Hgb urine dipstick SMALL (*)    Protein, ur 30 (*)    Bacteria, UA RARE (*)    All other components within normal limits  BLOOD GAS, VENOUS - Abnormal; Notable for the following components:    pCO2, Ven 40.3 (*)    pO2, Ven 52.5 (*)    All other components within normal limits  HEPATIC FUNCTION PANEL - Abnormal; Notable for the following components:   Total Protein 8.4 (*)    Albumin 3.3 (*)    AST 14 (*)    All other components within normal limits  CBG MONITORING, ED - Abnormal; Notable for the following components:   Glucose-Capillary 323 (*)    All other components within normal limits  URINE CULTURE  LIPASE, BLOOD    EKG EKG Interpretation  Date/Time:  Thursday December 11 2018 12:23:42 EST Ventricular Rate:  79 PR Interval:    QRS Duration: 91 QT Interval:  417 QTC Calculation: 478 R Axis:   72 Text Interpretation:  Sinus rhythm ST elevation, consider anterior injury Borderline prolonged QT interval When compared to prior, no significant changes seen.  No STEMI Confirmed by Antony Blackbird [phone]) on 12/11/2018 1:00:36 PM   Radiology Dg Chest 2 View  Result Date: 12/11/2018 CLINICAL DATA:  Lightheaded, fell, orthostatic hypotension, BILATERAL thigh pain from falls, hyperglycemia, type II diabetes mellitus, smoker, history hypertension EXAM: CHEST - 2 VIEW COMPARISON:  12/04/2018 FINDINGS: Normal heart size, mediastinal contours, and pulmonary vascularity. Atherosclerotic calcification aorta. Lungs clear. No pulmonary infiltrate, pleural effusion or pneumothorax. No acute osseous findings. IMPRESSION: No acute abnormalities. Electronically Signed   By: Lavonia Dana M.D.   On: 12/11/2018 13:15   Dg Femur Min 2 Views Left  Result Date: 12/11/2018 CLINICAL DATA:  Lightheaded, fell when getting up from the toilet, lost balance, orthostatic hypotension, BILATERAL thigh pain from falls, hyperglycemia, type II diabetes mellitus, smoker, history hypertension EXAM: LEFT FEMUR 2 VIEWS COMPARISON:  None FINDINGS: Osseous demineralization. Hip and knee joint spaces preserved. No acute fracture, dislocation, or bone destruction. Extensive atherosclerotic calcifications from LEFT  groin to LEFT popliteal fossa. IMPRESSION: No acute osseous abnormalities. Electronically Signed   By: Lavonia Dana M.D.   On: 12/11/2018 13:16  Dg Femur Min 2 Views Right  Result Date: 12/11/2018 CLINICAL DATA:  Lightheaded, fell when getting up from the toilet, lost balance, orthostatic hypotension, BILATERAL thigh pain from falls, hyperglycemia, type II diabetes mellitus, smoker, history hypertension EXAM: RIGHT FEMUR 2 VIEWS COMPARISON:  None FINDINGS: Osseous demineralization. RIGHT hip and knee joint spaces preserved. No acute fracture, dislocation, or bone destruction. Pelvis appears intact. Extensive atherosclerotic calcifications from pelvis to popliteal fossa. IMPRESSION: No acute osseous abnormalities. Electronically Signed   By: Lavonia Dana M.D.   On: 12/11/2018 13:17    Procedures Procedures (including critical care time)  Medications Ordered in ED Medications  sodium chloride 0.9 % bolus 1,000 mL (0 mLs Intravenous Stopped 12/11/18 1431)  sodium chloride 0.9 % bolus 1,000 mL (1,000 mLs Intravenous New Bag/Given 12/11/18 1455)     Initial Impression / Assessment and Plan / ED Course  I have reviewed the triage vital signs and the nursing notes.  Pertinent labs & imaging results that were available during my care of the patient were reviewed by me and considered in my medical decision making (see chart for details).  Clinical Course as of Dec 11 1742  Thu Dec 11, 2018  1550 Orthostatic with fall, hypoglycemia, got fluid, sugar better, still very orthostatic, needs re-eval after 2nd liter, consider admit   [MB]    Clinical Course User Index [MB] Maudie Flakes, MD    Brian Owen is a 68 y.o. male with a past medical history significant for diabetes, prior hypertension, prior SVT, chronic orthostatic hypotension on Midodrine, diabetes, CKD, vertigo, and tobacco abuse who presents for recurrent lightheadedness and falls and hyperglycemia.  Patient reports that he had a fall  yesterday after he stood up quickly and then got lightheaded and fell to the ground.  He reports he hurt both of his thighs.  He reports that his symptoms got better throughout the day.  He reports that today he stood up after using the restroom and had a fall.  He did not hit his head and denies any injuries from the fall today.  He reports his both eyes are still hurting from the fall yesterday.  No numbness, tingling, or weakness of extremities.  He denies any recent fevers but reports occasional chills.  He denies significant congestion or cough.  He denies any urinary symptoms.  He denies any chest pain, palpitations, shortness of breath.  He reports that despite taking his medications, he is still having elevated glucose.  Reports his glucose is "always above 300".  He reports he has taken his glucose medications.  He denies any other complaints on arrival and is otherwise feeling well currently.  On exam, abdomen is nontender.  Chest is nontender.  No significant murmur was appreciated.  Lungs were clear.  Patient moving all extremities.  Normal strength in the legs and normal sensation in the legs.  Normal pulses in the legs.  Patient did have tenderness in his mid thigh bilaterally.  No posterior leg pain or tenderness.  No swelling seen.  Patient had no evidence of trauma to his head.  No neck tenderness or other back tenderness.  Patient otherwise appears well.  Initial glucose was 323 on arrival to the ED.  Patient will be given fluids as he does feel dehydrated.  Patient was not hypotensive on arrival however it appears that orthostatic hypotension has been a recurrent problem for him and he was admitted recently for this.  Patient will have labs to look for occult  infection chest x-ray to look for occult pneumonia with his chills, and will have work-up to look for electrolyte imbalance or DKA.  If patient's orthostatic hypotension has improved after rehydration and his glucose improved after  fluids, patient will likely be stable for discharge home with plans to follow-up with PCP to discuss further outpatient glucose management changes.  Anticipate reassessment.  He will also have x-rays of his legs from the tenderness.  Low suspicion for fracture at this time.  3:46 PM X-ray showed no fracture in the legs.  Chest x-ray reassuring.  Patient does have similar creatinine to prior.  Mild leukocytosis and anemia.  Patient is not in DKA.  No evidence of urinary tract infection.  Suspect continue dehydration leading to orthostatic hypotension leading to his falls.  No traumatic injury seen, patient cleared from a traumatic standpoint.  After liter fluid, patient had orthostatics checked and he went from a blood pressure of around 004 systolic to 78.  Patient got lightheaded had to sit back down.  He will be given another liter fluid and rechecked.  The patient remains orthostatic, patient will require admission for further rehydration and monitoring.  Care transferred to Dr. Sedonia Small while awaiting more fluids and reassessment. DC if OK and admit if still orthostatic.   Final Clinical Impressions(s) / ED Diagnoses   Final diagnoses:  Fall, initial encounter  Hyperglycemia  Orthostatic hypotension    ED Discharge Orders    None     Clinical Impression: 1. Fall, initial encounter   2. Hyperglycemia   3. Orthostatic hypotension     Disposition: Awaiting reassessment after more fluid and recheck of orthostatics  This note was prepared with assistance of Dragon voice recognition software. Occasional wrong-word or sound-a-like substitutions may have occurred due to the inherent limitations of voice recognition software.      Cherril Hett, Gwenyth Allegra, MD 12/11/18 [phone]

## 2018-12-11 NOTE — Progress Notes (Signed)
Inpatient Diabetes Program Recommendations  AACE/ADA: New Consensus Statement on Inpatient Glycemic Control (2015)  Target Ranges:  Prepandial:   less than 140 mg/dL      Peak postprandial:   less than 180 mg/dL (1-2 hours)      Critically ill patients:  140 - 180 mg/dL   Lab Results  Component Value Date   GLUCAP 323 (H) 12/11/2018   HGBA1C 12.7 (H) 11/11/2018    Review of Glycemic Control  Diabetes history: DM2 Outpatient Diabetes medications: Lantus 9 units QHS Current orders for Inpatient glycemic control: None  HgbA1C - 12.7% - uncontrolled  Inpatient Diabetes Program Recommendations:     Add Novolog 0-9 units Q6H Add Lantus 5 units QHS  Will follow closely.  Thank you. Lorenda Peck, RD, LDN, CDE Inpatient Diabetes Coordinator [phone]

## 2018-12-11 NOTE — ED Notes (Addendum)
Patient started to have dizziness . This RN was unable to complete 3 min Orthostatic VS. Patient had to immediately sit down.

## 2018-12-11 NOTE — ED Notes (Signed)
attempted to call wife to come pick pt up at his request, no answer nor answering machine

## 2018-12-11 NOTE — Progress Notes (Addendum)
CSW received a call from registration stating pt was waiting for his spouse in the ED waiting room.  Notes confirm pt's wife was called and was unable to be reached.  CSW called pt's daughters Georgiana Shore [phone]) and Kensington Duerst at ph: [phone] and [phone] and left a HIPPA-compliant VM asking to be called back.  CSW spoke to pt's granddaughter who stated she would call her aunt and tell her to pick up the pt.  10:01 PM Pt's daughter called and stated pt's granddaughter is en route to p/u the pt.  Registration updated.  CSW will continue to follow for D/C needs.  Alphonse Guild. Qamar Rosman, LCSW, LCAS, CSI Clinical Social Worker Ph: [phone]

## 2018-12-11 NOTE — ED Provider Notes (Signed)
  Provider Note MRN:  [id number]  Arrival date & time: 12/11/18    ED Course and Medical Decision Making  I received sign out for this patient at shift change from Dr. Sherry Ruffing.  Patient received second liter of IV fluids, was ambulated again and did very well, no symptoms, feeling better, appropriate for discharge.  After the discussed management above, the patient was determined to be safe for discharge.  The patient was in agreement with this plan and all questions regarding their care were answered.  ED return precautions were discussed and the patient will return to the ED with any significant worsening of condition.  Barth Kirks. Sedonia Small, [place] [email or web address]    Maudie Flakes, MD 12/11/18 [phone]

## 2018-12-11 NOTE — ED Notes (Signed)
Patient eating mac and cheese- will get him to ambulate with pulse ox when finished eating.

## 2018-12-11 NOTE — Discharge Instructions (Addendum)
You were evaluated in the Emergency Department and after careful evaluation, we did not find any emergent condition requiring admission or further testing in the hospital.  Your symptoms today seem to be due to dehydration.  Please continue to drink plenty of water at home.  Take your time when changing positions from laying or sitting or standing.  Please return to the Emergency Department if you experience any worsening of your condition.  We encourage you to follow up with a primary care provider.  Thank you for allowing [place] to be a part of your care.

## 2018-12-12 LAB — URINE CULTURE: CULTURE: NO GROWTH

## 2019-03-03 ENCOUNTER — Other Ambulatory Visit: Payer: Self-pay | Admitting: *Deleted

## 2019-03-13 ENCOUNTER — Other Ambulatory Visit: Payer: Self-pay

## 2019-03-13 ENCOUNTER — Emergency Department (HOSPITAL_COMMUNITY): Payer: Medicare Other

## 2019-03-13 ENCOUNTER — Emergency Department (HOSPITAL_COMMUNITY)
Admission: EM | Admit: 2019-03-13 | Discharge: 2019-03-14 | Disposition: A | Discharge status: Home / Self Care | Payer: Medicare Other | Attending: Emergency Medicine | Admitting: Emergency Medicine

## 2019-03-13 DIAGNOSIS — I129 Hypertensive chronic kidney disease with stage 1 through stage 4 chronic kidney disease, or unspecified chronic kidney disease: Secondary | ICD-10-CM | POA: Insufficient documentation

## 2019-03-13 DIAGNOSIS — R9082 White matter disease, unspecified: Secondary | ICD-10-CM | POA: Diagnosis not present

## 2019-03-13 DIAGNOSIS — I951 Orthostatic hypotension: Secondary | ICD-10-CM

## 2019-03-13 DIAGNOSIS — E1165 Type 2 diabetes mellitus with hyperglycemia: Secondary | ICD-10-CM | POA: Diagnosis not present

## 2019-03-13 DIAGNOSIS — R5383 Other fatigue: Secondary | ICD-10-CM | POA: Insufficient documentation

## 2019-03-13 DIAGNOSIS — Z87891 Personal history of nicotine dependence: Secondary | ICD-10-CM | POA: Diagnosis not present

## 2019-03-13 DIAGNOSIS — R51 Headache: Secondary | ICD-10-CM | POA: Insufficient documentation

## 2019-03-13 DIAGNOSIS — R519 Headache, unspecified: Secondary | ICD-10-CM

## 2019-03-13 DIAGNOSIS — I7389 Other specified peripheral vascular diseases: Secondary | ICD-10-CM | POA: Insufficient documentation

## 2019-03-13 DIAGNOSIS — E1122 Type 2 diabetes mellitus with diabetic chronic kidney disease: Secondary | ICD-10-CM | POA: Diagnosis not present

## 2019-03-13 DIAGNOSIS — R739 Hyperglycemia, unspecified: Secondary | ICD-10-CM

## 2019-03-13 DIAGNOSIS — J439 Emphysema, unspecified: Secondary | ICD-10-CM | POA: Diagnosis not present

## 2019-03-13 DIAGNOSIS — N183 Chronic kidney disease, stage 3 (moderate): Secondary | ICD-10-CM | POA: Diagnosis not present

## 2019-03-13 DIAGNOSIS — R42 Dizziness and giddiness: Secondary | ICD-10-CM | POA: Insufficient documentation

## 2019-03-13 DIAGNOSIS — I959 Hypotension, unspecified: Secondary | ICD-10-CM | POA: Diagnosis not present

## 2019-03-13 DIAGNOSIS — R531 Weakness: Secondary | ICD-10-CM | POA: Diagnosis not present

## 2019-03-13 LAB — CBC WITH DIFFERENTIAL/PLATELET
Abs Immature Granulocytes: 0.02 10*3/uL (ref 0.00–0.07)
Basophils Absolute: 0 10*3/uL (ref 0.0–0.1)
Basophils Relative: 1 %
Eosinophils Absolute: 0.1 10*3/uL (ref 0.0–0.5)
Eosinophils Relative: 1 %
HCT: 32.4 % — ABNORMAL LOW (ref 39.0–52.0)
Hemoglobin: 10.2 g/dL — ABNORMAL LOW (ref 13.0–17.0)
Immature Granulocytes: 0 %
Lymphocytes Relative: 22 %
Lymphs Abs: 1.4 10*3/uL (ref 0.7–4.0)
MCH: 28.3 pg (ref 26.0–34.0)
MCHC: 31.5 g/dL (ref 30.0–36.0)
MCV: 90 fL (ref 80.0–100.0)
Monocytes Absolute: 0.7 10*3/uL (ref 0.1–1.0)
Monocytes Relative: 12 %
Neutro Abs: 4.2 10*3/uL (ref 1.7–7.7)
Neutrophils Relative %: 64 %
Platelets: 151 10*3/uL (ref 150–400)
RBC: 3.6 MIL/uL — ABNORMAL LOW (ref 4.22–5.81)
RDW: 13 % (ref 11.5–15.5)
WBC: 6.4 10*3/uL (ref 4.0–10.5)
nRBC: 0 % (ref 0.0–0.2)

## 2019-03-13 LAB — COMPREHENSIVE METABOLIC PANEL
ALT: 15 U/L (ref 0–44)
AST: 17 U/L (ref 15–41)
Albumin: 3.3 g/dL — ABNORMAL LOW (ref 3.5–5.0)
Alkaline Phosphatase: 90 U/L (ref 38–126)
Anion gap: 9 (ref 5–15)
BUN: 22 mg/dL (ref 8–23)
CO2: 24 mmol/L (ref 22–32)
Calcium: 9.1 mg/dL (ref 8.9–10.3)
Chloride: 98 mmol/L (ref 98–111)
Creatinine, Ser: 2.18 mg/dL — ABNORMAL HIGH (ref 0.61–1.24)
GFR calc Af Amer: 35 mL/min — ABNORMAL LOW (ref >60)
GFR calc non Af Amer: 30 mL/min — ABNORMAL LOW (ref >60)
Glucose, Bld: 286 mg/dL — ABNORMAL HIGH (ref 70–99)
Potassium: 5.1 mmol/L (ref 3.5–5.1)
Sodium: 131 mmol/L — ABNORMAL LOW (ref 135–145)
Total Bilirubin: 0.6 mg/dL (ref 0.3–1.2)
Total Protein: 7 g/dL (ref 6.5–8.1)

## 2019-03-13 LAB — URINALYSIS, ROUTINE W REFLEX MICROSCOPIC
Bacteria, UA: NONE SEEN
Bilirubin Urine: NEGATIVE
Glucose, UA: >=500 mg/dL — AB
Hgb urine dipstick: NEGATIVE
Ketones, ur: NEGATIVE mg/dL
Leukocytes,Ua: NEGATIVE
Nitrite: NEGATIVE
Protein, ur: 100 mg/dL — AB
Specific Gravity, Urine: 1.014 (ref 1.005–1.030)
pH: 5 (ref 5.0–8.0)

## 2019-03-13 LAB — PROTIME-INR
INR: 1 (ref 0.8–1.2)
Prothrombin Time: 12.6 seconds (ref 11.4–15.2)

## 2019-03-13 MED ORDER — GLUCOSE BLOOD VI STRP: 1.0000 | ORAL_STRIP | Freq: Three times a day (TID) | 0 refills | Status: AC

## 2019-03-13 MED ORDER — LIDOCAINE HCL (PF) 1 % IJ SOLN
INTRAMUSCULAR | Status: AC
  Filled 2019-03-13: qty 10

## 2019-03-13 MED ORDER — DIPHENHYDRAMINE HCL 50 MG/ML IJ SOLN
12.5000 mg | Freq: Once | INTRAMUSCULAR | Status: AC
  Administered 2019-03-13: 12.5 mg via INTRAVENOUS
  Filled 2019-03-13: qty 1

## 2019-03-13 MED ORDER — SODIUM CHLORIDE 0.9 % IV BOLUS
1000.0000 mL | Freq: Once | INTRAVENOUS | Status: AC
  Administered 2019-03-13: 1000 mL via INTRAVENOUS

## 2019-03-13 MED ORDER — METOCLOPRAMIDE HCL 5 MG/ML IJ SOLN
5.0000 mg | Freq: Once | INTRAMUSCULAR | Status: AC
  Administered 2019-03-13: 5 mg via INTRAVENOUS
  Filled 2019-03-13: qty 2

## 2019-03-13 MED ORDER — MIDODRINE HCL 2.5 MG PO TABS: 2.5000 mg | ORAL_TABLET | Freq: Three times a day (TID) | ORAL | 0 refills | Status: DC

## 2019-03-13 MED ORDER — ACETAMINOPHEN 500 MG PO TABS
1000.0000 mg | ORAL_TABLET | Freq: Once | ORAL | Status: AC
  Administered 2019-03-13: 1000 mg via ORAL
  Filled 2019-03-13: qty 2

## 2019-03-13 MED ORDER — INSULIN GLARGINE 100 UNIT/ML ~~LOC~~ SOLN: 9.0000 [IU] | Freq: Every day | SUBCUTANEOUS | 0 refills | Status: AC

## 2019-03-13 NOTE — ED Triage Notes (Signed)
Patient is brought here from home due to headache since last night. [facility] states that he originally was hypotensive around 80 palp but has since been normotensive with the last pressure of 128/72. His CBG was 358.

## 2019-03-13 NOTE — ED Provider Notes (Signed)
[facility] EMERGENCY DEPARTMENT Provider Note   CSN: [id number] Arrival date & time: 03/13/19  1629    History   Chief Complaint Chief Complaint  Patient presents with   Headache    HPI Brian Owen is a 68 y.o. male.     HPI 68 year old male with past medical history as below here with headache.  The patient states his headache began acutely yesterday.  He states it began fairly abruptly and was initially very severe.  Since then, he has had mild, persistent headache.  He is also had general fatigue.  He is a somewhat difficult historian and language barrier despite [place] interpreter somewhat exists.  Patient states that the headache is frontal, aching, and improved now, though persistent.  He does not have a history of headaches.  Denies known history of migraines.  He is not on blood thinners.  No neck pain.  He has some chronic tingling in his bilateral upper and lower extremities, but states this is not new.  No focal numbness or weakness.  No double vision.  He states his dizziness and weakness has been persistent since he recently ran out of all of his medications.  His blood sugars have been high.  He does not currently have a PCP.  No other complaints.  Past Medical History:  Diagnosis Date   Bullae 11/11/2015   legs/notes 11/11/2015   Chronic kidney disease    Heavy cigarette smoker    Parasite infection 2016   "couldn't afford to have surgery done; just left it; lower legs"   Uncontrolled type II diabetes mellitus ([place]) dx'd 2013    Patient Active Problem List   Diagnosis Date Noted   Normochromic anemia    Orthostatic hypertension 11/12/2018   Diabetic peripheral neuropathy associated with type 2 diabetes mellitus ([place])    Chronic diarrhea    Uncontrolled type 2 diabetes mellitus with hyperglycemia ([place])    Gastroenteritis due to norovirus 01/12/2018   AKI (acute kidney injury) ([place]) 01/12/2018   Type II diabetes mellitus with  renal manifestations (HCC) 01/11/2018   Absent pedal pulses 09/27/2017   Syncope 01/13/2017   Postural dizziness with presyncope    CKD (chronic kidney disease) stage 3, GFR 30-59 ml/min (HCC) 06/16/2016   Underweight 01/17/2016   Autonomic neuropathy due to secondary diabetes ([place]) 11/29/2015   Hyperglycemia 11/27/2015   Hypotension 11/27/2015   Vertigo 11/27/2015   Orthostatic hypotension 11/27/2015   SVT (supraventricular tachycardia) ([place]) 11/11/2015   Diarrhea 08/17/2014   Protein-calorie malnutrition, severe ([place]) 07/26/2014   History of tobacco use 06/27/2014   Edentulism, complete 06/27/2014   Language barrier to communication 06/27/2014   HTN (hypertension) 06/27/2014   Hyponatremia 05/13/2014   Type 2 diabetes mellitus with hyperglycemia, with long-term current use of insulin ([place]) 05/13/2014    Past Surgical History:  Procedure Laterality Date   NO PAST SURGERIES          Home Medications    Prior to Admission medications   Medication Sig Start Date End Date Taking? Authorizing Provider  ACCU-CHEK SOFTCLIX LANCETS lancets 1 each by Other route 3 (three) times daily. 08/13/18   Hayden Rasmussen, MD  acetaminophen (TYLENOL) 500 MG tablet Take 500 mg by mouth 2 (two) times daily.    [provider]  Blood Glucose Monitoring Suppl (ACCU-CHEK AVIVA PLUS) w/Device KIT 1 Device by Does not apply route 3 (three) times daily after meals. 08/13/18   Hayden Rasmussen, MD  glucose blood (ACCU-CHEK AVIVA  PLUS) test strip 1 each by Other route 3 (three) times daily. 03/13/19   Duffy Bruce, MD  insulin glargine (LANTUS) 100 UNIT/ML injection Inject 0.09 mLs (9 Units total) into the skin at bedtime. 03/13/19   Duffy Bruce, MD  midodrine (PROAMATINE) 2.5 MG tablet Take 1 tablet (2.5 mg total) by mouth 3 (three) times daily with meals for 7 days. 03/13/19 03/20/19  Duffy Bruce, MD    Family History Family History  Problem Relation Age of  Onset   Diabetes Other    Diabetes Other     Social History Social History   Tobacco Use   Smoking status: Former Smoker    Packs/day: 1.50    Years: 48.00    Pack years: 72.00    Types: Cigarettes    Last attempt to quit: 2015    Years since quitting: 5.3   Smokeless tobacco: Never Used  Substance Use Topics   Alcohol use: No   Drug use: No     Allergies   Patient has no known allergies.   Review of Systems Review of Systems  Constitutional: Positive for fatigue. Negative for chills and fever.  HENT: Negative for congestion and rhinorrhea.   Eyes: Negative for visual disturbance.  Respiratory: Negative for cough, shortness of breath and wheezing.   Cardiovascular: Negative for chest pain and leg swelling.  Gastrointestinal: Negative for abdominal pain, diarrhea, nausea and vomiting.  Genitourinary: Negative for dysuria and flank pain.  Musculoskeletal: Negative for neck pain and neck stiffness.  Skin: Negative for rash and wound.  Allergic/Immunologic: Negative for immunocompromised state.  Neurological: Positive for light-headedness (Upon standing) and headaches. Negative for syncope and weakness.  All other systems reviewed and are negative.    Physical Exam Updated Vital Signs BP 120/75 (BP Location: Right Arm)    Pulse 75    Temp 98.6 F (37 C) (Oral)    Resp 19    SpO2 99%   Physical Exam Vitals signs and nursing note reviewed.  Constitutional:      General: He is not in acute distress.    Appearance: He is well-developed.  HENT:     Head: Normocephalic and atraumatic.  Eyes:     Conjunctiva/sclera: Conjunctivae normal.  Neck:     Musculoskeletal: Neck supple. No neck rigidity.     Comments: No midline or paraspinal tenderness Cardiovascular:     Rate and Rhythm: Normal rate and regular rhythm.     Heart sounds: Normal heart sounds. No murmur. No friction rub.  Pulmonary:     Effort: Pulmonary effort is normal. No respiratory distress.      Breath sounds: Normal breath sounds. No wheezing or rales.  Abdominal:     General: There is no distension.     Palpations: Abdomen is soft.     Tenderness: There is no abdominal tenderness.  Skin:    General: Skin is warm.     Capillary Refill: Capillary refill takes less than 2 seconds.  Neurological:     Mental Status: He is alert and oriented to person, place, and time.     Motor: No abnormal muscle tone.     Neurological Exam:  Mental Status: Alert and oriented to person, place, and time. Attention and concentration normal. Speech clear. Recent memory is intact. Cranial Nerves: Visual fields grossly intact. EOMI and PERRLA. No nystagmus noted. Facial sensation intact at forehead, maxillary cheek, and chin/mandible bilaterally. No facial asymmetry or weakness. Hearing grossly normal. Uvula is midline, and palate elevates  symmetrically. Normal SCM and trapezius strength. Tongue midline without fasciculations. Motor: Muscle strength 5/5 in proximal and distal UE and LE bilaterally. No pronator drift. Muscle tone normal. Reflexes: 2+ and symmetrical in all four extremities.  Sensation: Intact to light touch in upper and lower extremities distally bilaterally.  Gait: Normal without ataxia. Coordination: Normal FTN bilaterally.      ED Treatments / Results  Labs (all labs ordered are listed, but only abnormal results are displayed) Labs Reviewed  CBC WITH DIFFERENTIAL/PLATELET - Abnormal; Notable for the following components:      Result Value   RBC 3.60 (*)    Hemoglobin 10.2 (*)    HCT 32.4 (*)    All other components within normal limits  COMPREHENSIVE METABOLIC PANEL - Abnormal; Notable for the following components:   Sodium 131 (*)    Glucose, Bld 286 (*)    Creatinine, Ser 2.18 (*)    Albumin 3.3 (*)    GFR calc non Af Amer 30 (*)    GFR calc Af Amer 35 (*)    All other components within normal limits  PROTIME-INR  URINALYSIS, ROUTINE W REFLEX MICROSCOPIC     EKG None  Radiology Ct Head Wo Contrast  Result Date: 03/13/2019 CLINICAL DATA:  Headache, worst of life EXAM: CT HEAD WITHOUT CONTRAST CT CERVICAL SPINE WITHOUT CONTRAST TECHNIQUE: Multidetector CT imaging of the head and cervical spine was performed following the standard protocol without intravenous contrast. Multiplanar CT image reconstructions of the cervical spine were also generated. COMPARISON:  12/04/2018 FINDINGS: CT HEAD FINDINGS Brain: No evidence of acute infarction, hemorrhage, hydrocephalus, extra-axial collection or mass lesion/mass effect. Mild periventricular white matter hypodensity. Vascular: No hyperdense vessel or unexpected calcification. Skull: Normal. Negative for fracture or focal lesion. Sinuses/Orbits: No acute finding. Other: None. CT CERVICAL SPINE FINDINGS Alignment: Normal. Skull base and vertebrae: No acute fracture. No primary bone lesion or focal pathologic process. Soft tissues and spinal canal: No prevertebral fluid or swelling. No visible canal hematoma. Disc levels:  Mild multilevel disc space height loss. Upper chest: Emphysema. Other: None. IMPRESSION: 1. No acute intracranial pathology. No non-contrast CT findings to explain headache. 2.  Small-vessel white matter disease. 3.  No fracture or static subluxation of the cervical spine. 4.  Emphysema. Electronically Signed   By: Eddie Candle M.D.   On: 03/13/2019 17:55   Ct Cervical Spine Wo Contrast  Result Date: 03/13/2019 CLINICAL DATA:  Headache, worst of life EXAM: CT HEAD WITHOUT CONTRAST CT CERVICAL SPINE WITHOUT CONTRAST TECHNIQUE: Multidetector CT imaging of the head and cervical spine was performed following the standard protocol without intravenous contrast. Multiplanar CT image reconstructions of the cervical spine were also generated. COMPARISON:  12/04/2018 FINDINGS: CT HEAD FINDINGS Brain: No evidence of acute infarction, hemorrhage, hydrocephalus, extra-axial collection or mass lesion/mass effect.  Mild periventricular white matter hypodensity. Vascular: No hyperdense vessel or unexpected calcification. Skull: Normal. Negative for fracture or focal lesion. Sinuses/Orbits: No acute finding. Other: None. CT CERVICAL SPINE FINDINGS Alignment: Normal. Skull base and vertebrae: No acute fracture. No primary bone lesion or focal pathologic process. Soft tissues and spinal canal: No prevertebral fluid or swelling. No visible canal hematoma. Disc levels:  Mild multilevel disc space height loss. Upper chest: Emphysema. Other: None. IMPRESSION: 1. No acute intracranial pathology. No non-contrast CT findings to explain headache. 2.  Small-vessel white matter disease. 3.  No fracture or static subluxation of the cervical spine. 4.  Emphysema. Electronically Signed   By: Eddie Candle  M.D.   On: 03/13/2019 17:55   Dg Lumbar Puncture Fluoro Guide  Result Date: 03/13/2019 CLINICAL DATA:  Severe headache, rule out subarachnoid hemorrhage EXAM: DIAGNOSTIC LUMBAR PUNCTURE UNDER FLUOROSCOPIC GUIDANCE FLUOROSCOPY TIME:  Fluoroscopy Time:  00:30 seconds Number of Acquired Spot Images: 2 PROCEDURE: Informed consent was obtained from the patient prior to the procedure using video interpreter phone, including potential complications of headache, allergy, and pain. With the patient prone, the lower back was prepped with Betadine. 1% Lidocaine was used for local anesthesia. Lumbar puncture was attempted bilaterally at both the L4-L5 and L5-S1 levels without successful return of CSF. No apparent complications. IMPRESSION: Unsuccessful attempt at fluoroscopic guided lumbar puncture. Lumbar puncture was attempted bilaterally at both the L4-L5 and L5-S1 levels without successful return of CSF. Findings reported by telephone to Dr. Ayesha Rumpf following the procedure. Electronically Signed   By: Eddie Candle M.D.   On: 03/13/2019 21:22    Procedures .Lumbar Puncture Date/Time: 03/13/2019 10:04 PM Performed by: Duffy Bruce,  MD Authorized by: Duffy Bruce, MD   Consent:    Consent obtained:  Written   Consent given by:  Patient   Risks discussed:  Bleeding, headache, infection, nerve damage, repeat procedure and pain   Alternatives discussed:  Alternative treatment, observation and referral Pre-procedure details:    Procedure purpose:  Diagnostic   Preparation: Patient was prepped and draped in usual sterile fashion   Anesthesia (see MAR for exact dosages):    Anesthesia method:  Local infiltration   Local anesthetic:  Lidocaine 1% w/o epi Procedure details:    Lumbar space:  L4-L5 interspace   Patient position:  R lateral decubitus   Needle gauge:  20   Needle type:  Spinal needle - Quincke tip   Needle length (in):  3.5   Number of attempts:  2 Post-procedure:    Puncture site:  Adhesive bandage applied   Patient tolerance of procedure:  Tolerated well, no immediate complications Comments:     LP attempted x 2 without success. Attempted in lateral decubitus and sitting positions. Dressing applied. No bleeding or hematoma noted.    (including critical care time)  Medications Ordered in ED Medications  lidocaine (PF) (XYLOCAINE) 1 % injection (has no administration in time range)  sodium chloride 0.9 % bolus 1,000 mL (1,000 mLs Intravenous New Bag/Given 03/13/19 1712)  acetaminophen (TYLENOL) tablet 1,000 mg (1,000 mg Oral Given 03/13/19 1826)  metoCLOPramide (REGLAN) injection 5 mg (5 mg Intravenous Given 03/13/19 2012)  diphenhydrAMINE (BENADRYL) injection 12.5 mg (12.5 mg Intravenous Given 03/13/19 2013)     Initial Impression / Assessment and Plan / ED Course  I have reviewed the triage vital signs and the nursing notes.  Pertinent labs & imaging results that were available during my care of the patient were reviewed by me and considered in my medical decision making (see chart for details).        68 year old male here with acute onset of severe headache yesterday.  Unclear etiology.   Differential includes subarachnoid and CT head is negative, but symptoms are greater than 6 hours out so will need lumbar puncture. Unfortunately, I was unable to perform this due to arthritis of his lower back so will have radiology perform.  Appreciate their help.  Otherwise, I have a low concern for meningitis but will follow-up cell count and differential.  Lab work is otherwise reassuring.  There could be a component of transient hypotension causing headache as well as his lightheadedness as he is  been out of his [place].  I have called in a brief refill until he can follow-up with his PCP.  Otherwise, no focal neurological deficits, negative CT head, and I do not suspect CVA.  He has some chronic paresthesias of his bilateral upper and lower extremities which I suspect are due to diabetes, rather than a focal neurological deficit.  Blood sugar mildly elevated but no signs of DKA.  Plan to follow-up LP, likely discharge if negative.  Signed out to Dr. Ralene Bathe.  Plan: If sx improved and Lp results negative, would d/c with refill on meds (i've already caled these in), encouraged fluids, tylenol PRN, and outpt follow-up.  Final Clinical Impressions(s) / ED Diagnoses   Final diagnoses:  Acute nonintractable headache, unspecified headache type  Hyperglycemia    ED Discharge Orders         Ordered    midodrine (PROAMATINE) 2.5 MG tablet  3 times daily with meals     03/13/19 1959    insulin glargine (LANTUS) 100 UNIT/ML injection  Daily at bedtime     03/13/19 1959    glucose blood (ACCU-CHEK AVIVA PLUS) test strip  3 times daily     03/13/19 1959           Duffy Bruce, MD 03/13/19 2207

## 2019-03-13 NOTE — ED Provider Notes (Signed)
Patient care assumed at 8 PM. Patient here for headache that began yesterday. Plan for LP to rule out subarachnoid hemorrhage. Radiology attempted LP but unable to obtain CSF. On assessment patient complains of mild headache, he is non-toxic appearing. Plan to obtain MRI to further evaluate headache. He is not a CTA candidate given his renal insufficiency.  Pt care transferred pending MRI.   Quintella Reichert, MD 03/13/19 2329

## 2019-03-13 NOTE — Discharge Instructions (Addendum)
It was our pleasure to provide your ER care today - we hope that you feel better.  Your blood sugar/glucose is high - continue your diabetic medication, check glucose level 4 times per day (before meals and at nighttime) and record values, and follow up with primary care doctor in the coming week.  Drink plenty of water, and follow diabetic eating plan.   Contact your doctors office this Monday AM to arrange for close follow up with them during the coming week.   Return to ER if worse, new symptoms, fevers, trouble breathing, fainting, other concern.

## 2019-03-14 ENCOUNTER — Emergency Department (HOSPITAL_COMMUNITY): Payer: Medicare Other

## 2019-03-14 DIAGNOSIS — R51 Headache: Secondary | ICD-10-CM | POA: Diagnosis not present

## 2019-03-14 DIAGNOSIS — R531 Weakness: Secondary | ICD-10-CM | POA: Diagnosis not present

## 2019-03-14 DIAGNOSIS — R42 Dizziness and giddiness: Secondary | ICD-10-CM | POA: Diagnosis not present

## 2019-03-14 LAB — CBG MONITORING, ED: Glucose-Capillary: 351 mg/dL — ABNORMAL HIGH (ref 70–99)

## 2019-03-14 MED ORDER — SODIUM CHLORIDE 0.9 % IV BOLUS
1000.0000 mL | Freq: Once | INTRAVENOUS | Status: AC
  Administered 2019-03-14: 04:00:00 1000 mL via INTRAVENOUS

## 2019-03-14 MED ORDER — SODIUM CHLORIDE 0.9 % IV BOLUS
1000.0000 mL | Freq: Once | INTRAVENOUS | Status: AC
  Administered 2019-03-14: 1000 mL via INTRAVENOUS

## 2019-03-14 MED ORDER — INSULIN ASPART 100 UNIT/ML ~~LOC~~ SOLN
6.0000 [IU] | Freq: Once | SUBCUTANEOUS | Status: AC
  Administered 2019-03-14: 6 [IU] via SUBCUTANEOUS

## 2019-03-14 NOTE — ED Notes (Signed)
Pt verbalized understanding discharge and follow up instructions. Pt wheeled to lobby in wheelchair to wait for wife. Sort RN notified of pt discharge and that wife is picking him up. If wife does not arrive in 30-40 mins , the daughter is to be notified.

## 2019-03-14 NOTE — ED Notes (Addendum)
Spoke with daughter who lives in [place]. She is also trying to get in touch with pt wife

## 2019-03-14 NOTE — ED Provider Notes (Signed)
Patient signed out that workup is complete, to d/c to home post ivf.   Patient given ivf, novolog.   Pt is eating and drinking.   Patient currently appears stable for d/c.      Lajean Saver, MD 03/14/19 [phone]

## 2019-03-14 NOTE — ED Notes (Signed)
Breakfast tray ordered 

## 2019-03-14 NOTE — ED Notes (Signed)
Pt was found standing at sink trying to get a drink of water. Immediately sat pt down.   Took B/P : 135/70 Pt reports feeling dizzy after standing   Pt back in bed using urinal. Brought pt 2X cups of water at bedside.

## 2019-03-14 NOTE — ED Notes (Signed)
Pt to MRI at this time.

## 2019-03-14 NOTE — ED Provider Notes (Signed)
I assumed care of this patient from Dr. Ayesha Rumpf at 0000.  Please see their note for further details of Hx, PE.  Briefly patient is a 68 y.o. male who presented with headache.  Pending MRI to assess for CVA versus subarachnoid hemorrhage.   Current plan is to follow-up MRI.  MRI is negative.  However upon record review, we noted concerning orthostatic vitals.  This was rechecked and confirmed.  On my reassessment patient is well-appearing.  Stated that his headache had resolved.  Is requesting food and something to drink.  Patient was given IV fluids and allowed to orally hydrate and eat.  Will reassess.   6:53 AM After 1 L IV fluid bolus, orthostatics improved however still dropping more than 40 points.  Will give additional IV fluid bolus and reassess. Should be stable for DC following bolus.  Patient care turned over to Dr Ashok Cordia at [phone]. Patient case and results discussed in detail; please see their note for further ED managment.         Fatima Blank, MD 03/14/19 [phone]

## 2019-03-14 NOTE — ED Notes (Signed)
Spoke with pts daughter who is the main contact for pt even though she lives in [place]. Updated wife phone number in system. Wife is on her way.

## 2019-03-14 NOTE — ED Notes (Signed)
Pt sitting up in bed eating breakfast

## 2019-03-14 NOTE — ED Notes (Signed)
Pt eating sandwich and drink coke cola. Tolerating well.

## 2019-03-14 NOTE — ED Notes (Signed)
Pt resting with eyes closed.  Waiting for MRI

## 2019-03-14 NOTE — ED Notes (Signed)
Patient was unable to stand over a couple of seconds during orthostatic VS

## 2019-03-15 ENCOUNTER — Encounter (HOSPITAL_COMMUNITY): Payer: Self-pay

## 2019-03-15 ENCOUNTER — Other Ambulatory Visit: Payer: Self-pay

## 2019-03-15 ENCOUNTER — Emergency Department (HOSPITAL_COMMUNITY)
Admission: EM | Admit: 2019-03-15 | Discharge: 2019-03-15 | Disposition: A | Discharge status: Home / Self Care | Payer: Medicare Other | Source: Home / Self Care | Attending: Emergency Medicine | Admitting: Emergency Medicine

## 2019-03-15 DIAGNOSIS — R55 Syncope and collapse: Secondary | ICD-10-CM | POA: Diagnosis not present

## 2019-03-15 DIAGNOSIS — R739 Hyperglycemia, unspecified: Secondary | ICD-10-CM

## 2019-03-15 DIAGNOSIS — I129 Hypertensive chronic kidney disease with stage 1 through stage 4 chronic kidney disease, or unspecified chronic kidney disease: Secondary | ICD-10-CM | POA: Insufficient documentation

## 2019-03-15 DIAGNOSIS — J80 Acute respiratory distress syndrome: Secondary | ICD-10-CM | POA: Diagnosis not present

## 2019-03-15 DIAGNOSIS — Z79899 Other long term (current) drug therapy: Secondary | ICD-10-CM | POA: Insufficient documentation

## 2019-03-15 DIAGNOSIS — U071 COVID-19: Secondary | ICD-10-CM | POA: Diagnosis not present

## 2019-03-15 DIAGNOSIS — E1122 Type 2 diabetes mellitus with diabetic chronic kidney disease: Secondary | ICD-10-CM | POA: Insufficient documentation

## 2019-03-15 DIAGNOSIS — Z87891 Personal history of nicotine dependence: Secondary | ICD-10-CM | POA: Insufficient documentation

## 2019-03-15 DIAGNOSIS — E1165 Type 2 diabetes mellitus with hyperglycemia: Secondary | ICD-10-CM | POA: Insufficient documentation

## 2019-03-15 DIAGNOSIS — J1289 Other viral pneumonia: Secondary | ICD-10-CM | POA: Diagnosis not present

## 2019-03-15 DIAGNOSIS — R42 Dizziness and giddiness: Secondary | ICD-10-CM | POA: Diagnosis not present

## 2019-03-15 DIAGNOSIS — R51 Headache: Secondary | ICD-10-CM | POA: Diagnosis not present

## 2019-03-15 DIAGNOSIS — N183 Chronic kidney disease, stage 3 (moderate): Secondary | ICD-10-CM | POA: Insufficient documentation

## 2019-03-15 DIAGNOSIS — I10 Essential (primary) hypertension: Secondary | ICD-10-CM | POA: Diagnosis not present

## 2019-03-15 DIAGNOSIS — I951 Orthostatic hypotension: Secondary | ICD-10-CM

## 2019-03-15 DIAGNOSIS — R000 Tachycardia, unspecified: Secondary | ICD-10-CM | POA: Diagnosis not present

## 2019-03-15 DIAGNOSIS — J810 Acute pulmonary edema: Secondary | ICD-10-CM | POA: Diagnosis not present

## 2019-03-15 DIAGNOSIS — R509 Fever, unspecified: Secondary | ICD-10-CM | POA: Diagnosis not present

## 2019-03-15 DIAGNOSIS — G92 Toxic encephalopathy: Secondary | ICD-10-CM | POA: Diagnosis not present

## 2019-03-15 DIAGNOSIS — R918 Other nonspecific abnormal finding of lung field: Secondary | ICD-10-CM | POA: Diagnosis not present

## 2019-03-15 DIAGNOSIS — Z9114 Patient's other noncompliance with medication regimen: Secondary | ICD-10-CM | POA: Insufficient documentation

## 2019-03-15 DIAGNOSIS — W19XXXA Unspecified fall, initial encounter: Secondary | ICD-10-CM | POA: Diagnosis not present

## 2019-03-15 DIAGNOSIS — A4189 Other specified sepsis: Secondary | ICD-10-CM | POA: Diagnosis not present

## 2019-03-15 LAB — URINALYSIS, ROUTINE W REFLEX MICROSCOPIC
Bacteria, UA: NONE SEEN
Bilirubin Urine: NEGATIVE
Glucose, UA: >=500 mg/dL — AB
Ketones, ur: NEGATIVE mg/dL
Leukocytes,Ua: NEGATIVE
Nitrite: NEGATIVE
Protein, ur: 100 mg/dL — AB
Specific Gravity, Urine: 1.014 (ref 1.005–1.030)
pH: 6 (ref 5.0–8.0)

## 2019-03-15 LAB — BASIC METABOLIC PANEL
Anion gap: 11 (ref 5–15)
BUN: 23 mg/dL (ref 8–23)
CO2: 22 mmol/L (ref 22–32)
Calcium: 8.2 mg/dL — ABNORMAL LOW (ref 8.9–10.3)
Chloride: 97 mmol/L — ABNORMAL LOW (ref 98–111)
Creatinine, Ser: 2.06 mg/dL — ABNORMAL HIGH (ref 0.61–1.24)
GFR calc Af Amer: 37 mL/min — ABNORMAL LOW (ref >60)
GFR calc non Af Amer: 32 mL/min — ABNORMAL LOW (ref >60)
Glucose, Bld: 449 mg/dL — ABNORMAL HIGH (ref 70–99)
Potassium: 4.7 mmol/L (ref 3.5–5.1)
Sodium: 130 mmol/L — ABNORMAL LOW (ref 135–145)

## 2019-03-15 LAB — CBC
HCT: 33.9 % — ABNORMAL LOW (ref 39.0–52.0)
Hemoglobin: 11.1 g/dL — ABNORMAL LOW (ref 13.0–17.0)
MCH: 29.7 pg (ref 26.0–34.0)
MCHC: 32.7 g/dL (ref 30.0–36.0)
MCV: 90.6 fL (ref 80.0–100.0)
Platelets: 122 10*3/uL — ABNORMAL LOW (ref 150–400)
RBC: 3.74 MIL/uL — ABNORMAL LOW (ref 4.22–5.81)
RDW: 13.1 % (ref 11.5–15.5)
WBC: 7.3 10*3/uL (ref 4.0–10.5)
nRBC: 0 % (ref 0.0–0.2)

## 2019-03-15 LAB — CBG MONITORING, ED
Glucose-Capillary: 431 mg/dL — ABNORMAL HIGH (ref 70–99)
Glucose-Capillary: 340 mg/dL — ABNORMAL HIGH (ref 70–99)

## 2019-03-15 MED ORDER — SODIUM CHLORIDE 0.9 % IV BOLUS
1000.0000 mL | Freq: Once | INTRAVENOUS | Status: AC
  Administered 2019-03-15: 1000 mL via INTRAVENOUS

## 2019-03-15 MED ORDER — INSULIN ASPART 100 UNIT/ML ~~LOC~~ SOLN: 8.0000 [IU] | Freq: Once | SUBCUTANEOUS | Status: DC

## 2019-03-15 MED ORDER — MIDODRINE HCL 2.5 MG PO TABS
2.5000 mg | ORAL_TABLET | Freq: Three times a day (TID) | ORAL | Status: DC
  Filled 2019-03-15: qty 1

## 2019-03-15 NOTE — ED Notes (Signed)
CBG- 340 

## 2019-03-15 NOTE — ED Notes (Signed)
While completing orthostatic vital signs, pt got very dizzy with writer and needed to sit before completing 3 minutes of standing for fourth orthostatic blood pressure.

## 2019-03-15 NOTE — ED Notes (Signed)
ED Provider at bedside. 

## 2019-03-15 NOTE — ED Provider Notes (Signed)
[facility] DEPT Provider Note   CSN: [id number] Arrival date & time: 03/15/19  1754    History   Chief Complaint Chief Complaint  Patient presents with   Hyperglycemia   Hypotension    HPI Diesel Mantia is a 68 y.o. male.     HPI  68 year old male with history of uncontrolled diabetes, CKD, SVT, history of multiple hospital admissions for orthostatic syncope and hypertension, thought to be secondary to poorly controlled diabetes and autonomic neuropathy, who has been prescribed Midrin but has a history of noncompliance, who presents with concern for near-syncope.  He had just been seen in the emergency department the night of the 15th and had work-up for headache including a CT head, attempted lumbar puncture by interventional radiology, and MRI which did not show evidence of CVA or subarachnoid hemorrhage--he was given refills of his Midodrine, IV fluid, and discharged. Per EMS, family had not yet filled rx, however per pt they had.   Patient reports he tried to stand up to walk but felt dizzy today.  Reports he has been sleepy, has had difficulty standing up and walking. Felt sleepy after getting back from the hospital early yesterday morning.  Walked to restroom and fell near bed.  Did not lose consciousness, just felt very weak and dizzy.  Patient reports he did take his midodrine today.  (triage note states he did not have it picked up yet), patient reports he takes it twice per day. It is written for three times per day.   No fever, no nausea or vomiting, no dysuria, cough, chest pain or dyspnea.   He does have a history of having several other prior CT head.  Work-up per internal medicine note reports he has had abdominal CTs showing no adrenal pathology, ACTH stimulation x2 borderline normal, and normal baseline cortisol and twice normal stimulated cortisol values.  Patient feels safe at home, wife grandchildren and children help him with  medications.       Past Medical History:  Diagnosis Date   Bullae 11/11/2015   legs/notes 11/11/2015   Chronic kidney disease    Heavy cigarette smoker    Parasite infection 2016   "couldn't afford to have surgery done; just left it; lower legs"   Uncontrolled type II diabetes mellitus ([place]) dx'd 2013    Patient Active Problem List   Diagnosis Date Noted   Normochromic anemia    Orthostatic hypertension 11/12/2018   Diabetic peripheral neuropathy associated with type 2 diabetes mellitus ([place])    Chronic diarrhea    Uncontrolled type 2 diabetes mellitus with hyperglycemia ([place])    Gastroenteritis due to norovirus 01/12/2018   AKI (acute kidney injury) ([place]) 01/12/2018   Type II diabetes mellitus with renal manifestations ([facility]) 01/11/2018   Absent pedal pulses 09/27/2017   Syncope 01/13/2017   Postural dizziness with presyncope    CKD (chronic kidney disease) stage 3, GFR 30-59 ml/min (H[facility]) 06/16/2016   Underweight 01/17/2016   Autonomic neuropathy due to secondary diabetes ([place]) 11/29/2015   Hyperglycemia 11/27/2015   Hypotension 11/27/2015   Vertigo 11/27/2015   Orthostatic hypotension 11/27/2015   SVT (supraventricular tachycardia) ([place]) 11/11/2015   Diarrhea 08/17/2014   Protein-calorie malnutrition, severe ([place]) 07/26/2014   History of tobacco use 06/27/2014   Edentulism, complete 06/27/2014   Language barrier to communication 06/27/2014   HTN (hypertension) 06/27/2014   Hyponatremia 05/13/2014   Type 2 diabetes mellitus with hyperglycemia, with long-term current use of insulin ([place]) 05/13/2014  Past Surgical History:  Procedure Laterality Date   NO PAST SURGERIES          Home Medications    Prior to Admission medications   Medication Sig Start Date End Date Taking? Authorizing Provider  acetaminophen (TYLENOL) 500 MG tablet Take 500 mg by mouth 2 (two) times daily.   Yes [provider]  insulin glargine  (LANTUS) 100 UNIT/ML injection Inject 0.09 mLs (9 Units total) into the skin at bedtime. 03/13/19  Yes Duffy Bruce, MD  midodrine (PROAMATINE) 2.5 MG tablet Take 1 tablet (2.5 mg total) by mouth 3 (three) times daily with meals for 7 days. 03/13/19 03/20/19 Yes Duffy Bruce, MD  ACCU-CHEK SOFTCLIX LANCETS lancets 1 each by Other route 3 (three) times daily. 08/13/18   Hayden Rasmussen, MD  Blood Glucose Monitoring Suppl (ACCU-CHEK AVIVA PLUS) w/Device KIT 1 Device by Does not apply route 3 (three) times daily after meals. 08/13/18   Hayden Rasmussen, MD  glucose blood (ACCU-CHEK AVIVA PLUS) test strip 1 each by Other route 3 (three) times daily. 03/13/19   Duffy Bruce, MD    Family History Family History  Problem Relation Age of Onset   Diabetes Other    Diabetes Other     Social History Social History   Tobacco Use   Smoking status: Former Smoker    Packs/day: 1.50    Years: 48.00    Pack years: 72.00    Types: Cigarettes    Last attempt to quit: 2015    Years since quitting: 5.3   Smokeless tobacco: Never Used  Substance Use Topics   Alcohol use: No   Drug use: No     Allergies   Patient has no known allergies.   Review of Systems Review of Systems  Constitutional: Positive for fatigue. Negative for fever.  HENT: Negative for sore throat.   Eyes: Negative for visual disturbance.  Respiratory: Negative for shortness of breath.   Cardiovascular: Negative for chest pain.  Gastrointestinal: Negative for abdominal pain, nausea and vomiting.  Genitourinary: Negative for difficulty urinating.  Musculoskeletal: Negative for back pain and neck stiffness.  Skin: Negative for rash.  Neurological: Positive for light-headedness. Negative for syncope, weakness, numbness and headaches (now resolved, did have yesterday).     Physical Exam Updated Vital Signs BP (!) 164/84 (BP Location: Right Arm)    Pulse 86    Temp 99 F (37.2 C) (Oral)    Resp 17    Wt 59 kg     SpO2 98%    BMI 24.56 kg/m   Physical Exam Vitals signs and nursing note reviewed.  Constitutional:      General: He is not in acute distress.    Appearance: He is well-developed. He is not diaphoretic.  HENT:     Head: Normocephalic and atraumatic.  Eyes:     Conjunctiva/sclera: Conjunctivae normal.  Neck:     Musculoskeletal: Normal range of motion.  Cardiovascular:     Rate and Rhythm: Normal rate and regular rhythm.     Heart sounds: Normal heart sounds. No murmur. No friction rub. No gallop.   Pulmonary:     Effort: Pulmonary effort is normal. No respiratory distress.     Breath sounds: Normal breath sounds. No wheezing or rales.  Abdominal:     General: There is no distension.     Palpations: Abdomen is soft.     Tenderness: There is no abdominal tenderness. There is no guarding.  Skin:  General: Skin is warm and dry.  Neurological:     Mental Status: He is alert and oriented to person, place, and time.      ED Treatments / Results  Labs (all labs ordered are listed, but only abnormal results are displayed) Labs Reviewed  BASIC METABOLIC PANEL - Abnormal; Notable for the following components:      Result Value   Sodium 130 (*)    Chloride 97 (*)    Glucose, Bld 449 (*)    Creatinine, Ser 2.06 (*)    Calcium 8.2 (*)    GFR calc non Af Amer 32 (*)    GFR calc Af Amer 37 (*)    All other components within normal limits  CBC - Abnormal; Notable for the following components:   RBC 3.74 (*)    Hemoglobin 11.1 (*)    HCT 33.9 (*)    Platelets 122 (*)    All other components within normal limits  URINALYSIS, ROUTINE W REFLEX MICROSCOPIC - Abnormal; Notable for the following components:   Color, Urine STRAW (*)    Glucose, UA >=500 (*)    Hgb urine dipstick MODERATE (*)    Protein, ur 100 (*)    All other components within normal limits  CBG MONITORING, ED - Abnormal; Notable for the following components:   Glucose-Capillary 431 (*)    All other components  within normal limits  CBG MONITORING, ED - Abnormal; Notable for the following components:   Glucose-Capillary 340 (*)    All other components within normal limits    EKG None  Radiology Mr [facility] Wo Contrast  Result Date: 03/14/2019 CLINICAL DATA:  Headache, weakness and dizziness. EXAM: MRI HEAD WITHOUT CONTRAST MRA HEAD WITHOUT CONTRAST TECHNIQUE: Multiplanar, multiecho pulse sequences of the brain and surrounding structures were obtained without intravenous contrast. Angiographic images of the head were obtained using MRA technique without contrast. COMPARISON:  None. FINDINGS: MRI HEAD FINDINGS BRAIN: There is no acute infarct, acute hemorrhage or mass effect. The midline structures are normal. Multifocal white matter hyperintensity, most commonly due to chronic ischemic microangiopathy. Advanced atrophy for age. There are 2-3 scattered foci of chronic microhemorrhage. SKULL AND UPPER CERVICAL SPINE: The visualized skull base, calvarium, upper cervical spine and extracranial soft tissues are normal. SINUSES/ORBITS: No fluid levels or advanced mucosal thickening. No mastoid or middle ear effusion. The orbits are normal. MRA HEAD FINDINGS POSTERIOR CIRCULATION: --Vertebral arteries: Normal codominant configuration of V4 segments. --Posterior inferior cerebellar arteries (PICA): Patent origins from the vertebral arteries. --Anterior inferior cerebellar arteries (AICA): Patent left origin from the basilar artery. Right AICA not clearly visualized. --Basilar artery: Normal. --Superior cerebellar arteries: Normal. --Posterior cerebral arteries (PCA): Normal. Both originate from the basilar artery. Posterior communicating arteries (p-comm) are diminutive or absent. ANTERIOR CIRCULATION: --Intracranial internal carotid arteries: Normal. --Anterior cerebral arteries (ACA): Normal. Both A1 segments are present. Patent anterior communicating artery (a-comm). --Middle cerebral arteries (MCA): Normal.  IMPRESSION: 1. No acute intracranial abnormality. 2. No emergent large vessel occlusion or high-grade stenosis. 3. Chronic ischemic microangiopathy. Electronically Signed   By: Ulyses Jarred M.D.   On: 03/14/2019 02:07   Mr Brain Wo Contrast (neuro Protocol)  Result Date: 03/14/2019 CLINICAL DATA:  Headache, weakness and dizziness. EXAM: MRI HEAD WITHOUT CONTRAST MRA HEAD WITHOUT CONTRAST TECHNIQUE: Multiplanar, multiecho pulse sequences of the brain and surrounding structures were obtained without intravenous contrast. Angiographic images of the head were obtained using MRA technique without contrast. COMPARISON:  None. FINDINGS: MRI HEAD  FINDINGS BRAIN: There is no acute infarct, acute hemorrhage or mass effect. The midline structures are normal. Multifocal white matter hyperintensity, most commonly due to chronic ischemic microangiopathy. Advanced atrophy for age. There are 2-3 scattered foci of chronic microhemorrhage. SKULL AND UPPER CERVICAL SPINE: The visualized skull base, calvarium, upper cervical spine and extracranial soft tissues are normal. SINUSES/ORBITS: No fluid levels or advanced mucosal thickening. No mastoid or middle ear effusion. The orbits are normal. MRA HEAD FINDINGS POSTERIOR CIRCULATION: --Vertebral arteries: Normal codominant configuration of V4 segments. --Posterior inferior cerebellar arteries (PICA): Patent origins from the vertebral arteries. --Anterior inferior cerebellar arteries (AICA): Patent left origin from the basilar artery. Right AICA not clearly visualized. --Basilar artery: Normal. --Superior cerebellar arteries: Normal. --Posterior cerebral arteries (PCA): Normal. Both originate from the basilar artery. Posterior communicating arteries (p-comm) are diminutive or absent. ANTERIOR CIRCULATION: --Intracranial internal carotid arteries: Normal. --Anterior cerebral arteries (ACA): Normal. Both A1 segments are present. Patent anterior communicating artery (a-comm). --Middle  cerebral arteries (MCA): Normal. IMPRESSION: 1. No acute intracranial abnormality. 2. No emergent large vessel occlusion or high-grade stenosis. 3. Chronic ischemic microangiopathy. Electronically Signed   By: Ulyses Jarred M.D.   On: 03/14/2019 02:07    Procedures Procedures (including critical care time)  Medications Ordered in ED Medications  midodrine (PROAMATINE) tablet 2.5 mg (has no administration in time range)  sodium chloride 0.9 % bolus 1,000 mL (1,000 mLs Intravenous New Bag/Given 03/15/19 1931)     Initial Impression / Assessment and Plan / ED Course  I have reviewed the triage vital signs and the nursing notes.  Pertinent labs & imaging results that were available during my care of the patient were reviewed by me and considered in my medical decision making (see chart for details).       68 year old male with history of uncontrolled diabetes, CKD, SVT, history of multiple hospital admissions for orthostatic syncope and hypertension, thought to be secondary to poorly controlled diabetes and autonomic neuropathy, who has been prescribed Midodrine but has a history of noncompliance, who presents with concern for near-syncope and fatigue.    Patient with severe orthostatic hypotension in the emergency department with laying blood pressures of 150s, and standing blood pressure of 74 systolic.  Did to have similar values in the past.  Per prior hospital admission discharge notes, he has had work-up for this including a work-up looking for adrenal insufficiency, and it was determined that his severe orthostatic hypotension is secondary to diabetic neuropathy and autonomic dysfunction.  Labs and history today do not suggest other cause of dizziness.  No signs of trauma. CSpine cleared by [facility].   I suspect this is ongoing orthostatic hypotension exacerbated by hyperglycemia resulting in dehydration as well as misunderstanding with patient taking midodrine BID rather than TID as  prescribed.  I discussed his prior diagnosis and plan with patient in detail with the interpreter, including the importance of adherence to 3 times daily Midodrine, and close monitoring of glucose. Patient reports he feels safe at home and has support from family but would appreciate home health help with medications/PT.  He was given fluids, Midodrine, with improvement in his orthostatic blood pressures.  Recommend close PCP follow up.      Final Clinical Impressions(s) / ED Diagnoses   Final diagnoses:  Orthostatic hypotension  Hyperglycemia    ED Discharge Orders    None       Gareth Morgan, MD 03/15/19 2141

## 2019-03-15 NOTE — ED Notes (Signed)
Pt has urinal at bedside and is aware a urine specimen is needed. Pt knows to call staff if assistance is needed. Pt has call bell within reach.

## 2019-03-15 NOTE — Discharge Instructions (Addendum)
Make sure you are taking your midodrine tablet 2.5mg  three times a day to help keep your blood pressures up.  You have a history of orthostatic hypotension that is thought to be from your diabetes (autonomic dysfunction from diabetic neuropathy.)  This means your body does not regulate your blood pressures appropriately when you stand up.  There is no quick cure for this however the symptoms and blood pressures can be better managed with medications like midodrine and by making sure your diabetes (sugars) are better under control.  You need to take the midodrine 3 times daily and follow up closely with your doctor to see if you need to make any other adjustments as an outpatient.

## 2019-03-15 NOTE — ED Notes (Signed)
RN Earnest Bailey and EDP Schlossman aware of previously charted orthostatic vital signs.

## 2019-03-15 NOTE — ED Notes (Signed)
Bed: VX79 Expected date:  Expected time:  Means of arrival:  Comments: 54y Male Non- compliant with meds- cc ortho hypo and Hyperglycemia 550

## 2019-03-15 NOTE — ED Notes (Signed)
Royce, RN is aware of CBG of 431 @ 1820

## 2019-03-15 NOTE — ED Triage Notes (Addendum)
Patient arrived via [facility]. Patient is from home. Unsure of A&O status currently due to patient speaking foreign language. Per EMS from patient family, patient is non-compliant with medication and has been seen recently regarding hypotension and hyperglycemia.   Patient is living with 4 adults and 1 baby at residence. Unsure of relationship to patient however one member stated his medication is still at pharmacy and when asked by EMS if family member was going to pick medication up today, patient family member stated "It's 5 O'clock and the pharmacy closes soon and I don't feel like getting it".   Patient family member stated to EMS "just take him to the hospital we will deal with it tomorrow".

## 2019-03-17 ENCOUNTER — Emergency Department (HOSPITAL_COMMUNITY): Payer: Medicare Other

## 2019-03-17 ENCOUNTER — Other Ambulatory Visit: Payer: Self-pay

## 2019-03-17 ENCOUNTER — Inpatient Hospital Stay (HOSPITAL_COMMUNITY)
Admission: EM | Admit: 2019-03-17 | Discharge: 2019-04-29 | DRG: 870 | Disposition: E | Payer: Medicare Other | Attending: Internal Medicine | Admitting: Internal Medicine

## 2019-03-17 ENCOUNTER — Encounter (HOSPITAL_COMMUNITY): Payer: Self-pay

## 2019-03-17 DIAGNOSIS — R34 Anuria and oliguria: Secondary | ICD-10-CM | POA: Diagnosis not present

## 2019-03-17 DIAGNOSIS — Z833 Family history of diabetes mellitus: Secondary | ICD-10-CM

## 2019-03-17 DIAGNOSIS — I959 Hypotension, unspecified: Secondary | ICD-10-CM

## 2019-03-17 DIAGNOSIS — R509 Fever, unspecified: Secondary | ICD-10-CM

## 2019-03-17 DIAGNOSIS — Z452 Encounter for adjustment and management of vascular access device: Secondary | ICD-10-CM

## 2019-03-17 DIAGNOSIS — I951 Orthostatic hypotension: Secondary | ICD-10-CM | POA: Diagnosis present

## 2019-03-17 DIAGNOSIS — I129 Hypertensive chronic kidney disease with stage 1 through stage 4 chronic kidney disease, or unspecified chronic kidney disease: Secondary | ICD-10-CM | POA: Diagnosis present

## 2019-03-17 DIAGNOSIS — J810 Acute pulmonary edema: Secondary | ICD-10-CM | POA: Diagnosis not present

## 2019-03-17 DIAGNOSIS — E11649 Type 2 diabetes mellitus with hypoglycemia without coma: Secondary | ICD-10-CM | POA: Diagnosis not present

## 2019-03-17 DIAGNOSIS — D62 Acute posthemorrhagic anemia: Secondary | ICD-10-CM | POA: Diagnosis not present

## 2019-03-17 DIAGNOSIS — U071 COVID-19: Secondary | ICD-10-CM | POA: Diagnosis not present

## 2019-03-17 DIAGNOSIS — N179 Acute kidney failure, unspecified: Secondary | ICD-10-CM | POA: Diagnosis present

## 2019-03-17 DIAGNOSIS — Z9119 Patient's noncompliance with other medical treatment and regimen: Secondary | ICD-10-CM

## 2019-03-17 DIAGNOSIS — A419 Sepsis, unspecified organism: Secondary | ICD-10-CM

## 2019-03-17 DIAGNOSIS — R0689 Other abnormalities of breathing: Secondary | ICD-10-CM | POA: Diagnosis not present

## 2019-03-17 DIAGNOSIS — E1165 Type 2 diabetes mellitus with hyperglycemia: Secondary | ICD-10-CM | POA: Diagnosis not present

## 2019-03-17 DIAGNOSIS — Z9289 Personal history of other medical treatment: Secondary | ICD-10-CM

## 2019-03-17 DIAGNOSIS — N184 Chronic kidney disease, stage 4 (severe): Secondary | ICD-10-CM | POA: Diagnosis present

## 2019-03-17 DIAGNOSIS — R0602 Shortness of breath: Secondary | ICD-10-CM

## 2019-03-17 DIAGNOSIS — J1289 Other viral pneumonia: Secondary | ICD-10-CM | POA: Diagnosis present

## 2019-03-17 DIAGNOSIS — J069 Acute upper respiratory infection, unspecified: Secondary | ICD-10-CM

## 2019-03-17 DIAGNOSIS — G92 Toxic encephalopathy: Secondary | ICD-10-CM | POA: Diagnosis present

## 2019-03-17 DIAGNOSIS — R740 Nonspecific elevation of levels of transaminase and lactic acid dehydrogenase [LDH]: Secondary | ICD-10-CM | POA: Diagnosis present

## 2019-03-17 DIAGNOSIS — R0902 Hypoxemia: Secondary | ICD-10-CM

## 2019-03-17 DIAGNOSIS — E1122 Type 2 diabetes mellitus with diabetic chronic kidney disease: Secondary | ICD-10-CM | POA: Diagnosis present

## 2019-03-17 DIAGNOSIS — R531 Weakness: Secondary | ICD-10-CM | POA: Diagnosis not present

## 2019-03-17 DIAGNOSIS — R918 Other nonspecific abnormal finding of lung field: Secondary | ICD-10-CM | POA: Diagnosis not present

## 2019-03-17 DIAGNOSIS — J9600 Acute respiratory failure, unspecified whether with hypoxia or hypercapnia: Secondary | ICD-10-CM

## 2019-03-17 DIAGNOSIS — Z87891 Personal history of nicotine dependence: Secondary | ICD-10-CM

## 2019-03-17 DIAGNOSIS — A4189 Other specified sepsis: Principal | ICD-10-CM | POA: Diagnosis present

## 2019-03-17 DIAGNOSIS — E872 Acidosis: Secondary | ICD-10-CM | POA: Diagnosis present

## 2019-03-17 DIAGNOSIS — I471 Supraventricular tachycardia: Secondary | ICD-10-CM | POA: Diagnosis present

## 2019-03-17 DIAGNOSIS — Z794 Long term (current) use of insulin: Secondary | ICD-10-CM

## 2019-03-17 DIAGNOSIS — R000 Tachycardia, unspecified: Secondary | ICD-10-CM | POA: Diagnosis not present

## 2019-03-17 DIAGNOSIS — R680 Hypothermia, not associated with low environmental temperature: Secondary | ICD-10-CM | POA: Diagnosis not present

## 2019-03-17 DIAGNOSIS — Z66 Do not resuscitate: Secondary | ICD-10-CM | POA: Diagnosis not present

## 2019-03-17 DIAGNOSIS — J9601 Acute respiratory failure with hypoxia: Secondary | ICD-10-CM

## 2019-03-17 DIAGNOSIS — D6959 Other secondary thrombocytopenia: Secondary | ICD-10-CM | POA: Diagnosis present

## 2019-03-17 DIAGNOSIS — R6520 Severe sepsis without septic shock: Secondary | ICD-10-CM | POA: Diagnosis present

## 2019-03-17 DIAGNOSIS — L8990 Pressure ulcer of unspecified site, unspecified stage: Secondary | ICD-10-CM | POA: Diagnosis not present

## 2019-03-17 DIAGNOSIS — Z0189 Encounter for other specified special examinations: Secondary | ICD-10-CM

## 2019-03-17 DIAGNOSIS — J80 Acute respiratory distress syndrome: Secondary | ICD-10-CM | POA: Diagnosis present

## 2019-03-17 DIAGNOSIS — R0600 Dyspnea, unspecified: Secondary | ICD-10-CM

## 2019-03-17 DIAGNOSIS — E1143 Type 2 diabetes mellitus with diabetic autonomic (poly)neuropathy: Secondary | ICD-10-CM | POA: Diagnosis present

## 2019-03-17 HISTORY — DX: Essential (primary) hypertension: I10

## 2019-03-17 LAB — COMPREHENSIVE METABOLIC PANEL
ALT: 19 U/L (ref 0–44)
AST: 50 U/L — ABNORMAL HIGH (ref 15–41)
Albumin: 2.6 g/dL — ABNORMAL LOW (ref 3.5–5.0)
Alkaline Phosphatase: 59 U/L (ref 38–126)
Anion gap: 15 (ref 5–15)
BUN: 41 mg/dL — ABNORMAL HIGH (ref 8–23)
CO2: 18 mmol/L — ABNORMAL LOW (ref 22–32)
Calcium: 8.6 mg/dL — ABNORMAL LOW (ref 8.9–10.3)
Chloride: 102 mmol/L (ref 98–111)
Creatinine, Ser: 3.36 mg/dL — ABNORMAL HIGH (ref 0.61–1.24)
GFR calc Af Amer: 21 mL/min — ABNORMAL LOW (ref >60)
GFR calc non Af Amer: 18 mL/min — ABNORMAL LOW (ref >60)
Glucose, Bld: 436 mg/dL — ABNORMAL HIGH (ref 70–99)
Potassium: 4.5 mmol/L (ref 3.5–5.1)
Sodium: 135 mmol/L (ref 135–145)
Total Bilirubin: 0.7 mg/dL (ref 0.3–1.2)
Total Protein: 6.8 g/dL (ref 6.5–8.1)

## 2019-03-17 LAB — CBC WITH DIFFERENTIAL/PLATELET
Abs Immature Granulocytes: 0.14 10*3/uL — ABNORMAL HIGH (ref 0.00–0.07)
Basophils Absolute: 0 10*3/uL (ref 0.0–0.1)
Basophils Relative: 0 %
Eosinophils Absolute: 0.7 10*3/uL — ABNORMAL HIGH (ref 0.0–0.5)
Eosinophils Relative: 9 %
HCT: 32.2 % — ABNORMAL LOW (ref 39.0–52.0)
Hemoglobin: 10.5 g/dL — ABNORMAL LOW (ref 13.0–17.0)
Immature Granulocytes: 2 %
Lymphocytes Relative: 10 %
Lymphs Abs: 0.7 10*3/uL (ref 0.7–4.0)
MCH: 29.1 pg (ref 26.0–34.0)
MCHC: 32.6 g/dL (ref 30.0–36.0)
MCV: 89.2 fL (ref 80.0–100.0)
Monocytes Absolute: 0.2 10*3/uL (ref 0.1–1.0)
Monocytes Relative: 2 %
Neutro Abs: 5.7 10*3/uL (ref 1.7–7.7)
Neutrophils Relative %: 77 %
Platelets: 127 10*3/uL — ABNORMAL LOW (ref 150–400)
RBC: 3.61 MIL/uL — ABNORMAL LOW (ref 4.22–5.81)
RDW: 13.1 % (ref 11.5–15.5)
WBC Morphology: INCREASED
WBC: 7.4 10*3/uL (ref 4.0–10.5)
nRBC: 0 % (ref 0.0–0.2)

## 2019-03-17 LAB — POCT I-STAT 7, (LYTES, BLD GAS, ICA,H+H)
Acid-base deficit: 7 mmol/L — ABNORMAL HIGH (ref 0.0–2.0)
Bicarbonate: 17.7 mmol/L — ABNORMAL LOW (ref 20.0–28.0)
Calcium, Ion: 1.2 mmol/L (ref 1.15–1.40)
HCT: 28 % — ABNORMAL LOW (ref 39.0–52.0)
Hemoglobin: 9.5 g/dL — ABNORMAL LOW (ref 13.0–17.0)
O2 Saturation: 90 %
Potassium: 4.6 mmol/L (ref 3.5–5.1)
Sodium: 135 mmol/L (ref 135–145)
TCO2: 19 mmol/L — ABNORMAL LOW (ref 22–32)
pCO2 arterial: 32.1 mmHg (ref 32.0–48.0)
pH, Arterial: 7.349 — ABNORMAL LOW (ref 7.350–7.450)
pO2, Arterial: 60 mmHg — ABNORMAL LOW (ref 83.0–108.0)

## 2019-03-17 LAB — LIPASE, BLOOD: Lipase: 24 U/L (ref 11–51)

## 2019-03-17 LAB — LACTIC ACID, PLASMA: Lactic Acid, Venous: 3.2 mmol/L (ref 0.5–1.9)

## 2019-03-17 MED ORDER — ACETAMINOPHEN 325 MG PO TABS
650.0000 mg | ORAL_TABLET | Freq: Four times a day (QID) | ORAL | Status: DC | PRN
  Administered 2019-03-17 – 2019-03-18 (×2): 650 mg via ORAL
  Filled 2019-03-17 (×3): qty 2

## 2019-03-17 MED ORDER — LACTATED RINGERS IV BOLUS: 1000.0000 mL | Freq: Once | INTRAVENOUS | Status: DC

## 2019-03-17 MED ORDER — SODIUM CHLORIDE 0.9 % IV SOLN
500.0000 mg | Freq: Once | INTRAVENOUS | Status: AC
  Administered 2019-03-17: 500 mg via INTRAVENOUS
  Filled 2019-03-17: qty 500

## 2019-03-17 MED ORDER — SODIUM CHLORIDE 0.9 % IV SOLN
1.0000 g | Freq: Once | INTRAVENOUS | Status: AC
  Administered 2019-03-17: 1 g via INTRAVENOUS
  Filled 2019-03-17: qty 10

## 2019-03-17 MED ORDER — LACTATED RINGERS IV BOLUS
1000.0000 mL | Freq: Once | INTRAVENOUS | Status: AC
  Administered 2019-03-17: 1000 mL via INTRAVENOUS

## 2019-03-17 MED ORDER — VANCOMYCIN HCL 10 G IV SOLR
1250.0000 mg | Freq: Once | INTRAVENOUS | Status: DC
  Filled 2019-03-17: qty 1250

## 2019-03-17 NOTE — ED Triage Notes (Signed)
Pt discharged from [facility] yesterday. Ems reports sick person, BP was initially 78/p. Now 148/90. Lethargic, generalized weakness, chills, fever 100.5*f, 28 resp.

## 2019-03-17 NOTE — ED Provider Notes (Signed)
[facility] EMERGENCY DEPARTMENT Provider Note   CSN: [id number] Arrival date & time: 03/16/2019  2124    History   Chief Complaint No chief complaint on file.   HPI Brian Owen is a 68 y.o. male.   The history is provided by the patient. The history is limited by a language barrier. A language interpreter was used.   68 year old male with a history of CKD, HTN, orthostatic hypotension on Midrin and uncontrolled T2DM presents with fever.  History is limited per patient due to language barrier.  Interpreter used.  Patient states that he has had fever for the past 5 days and has had ongoing lightheadedness.  Denies headache, vision changes, or neck pain.  Denies nausea, vomiting, abdominal pain, or urinary symptoms.  No wounds.  No joint pain or swelling.   Past Medical History:  Diagnosis Date  . Bullae 11/11/2015   legs/notes 11/11/2015  . Chronic kidney disease   . Heavy cigarette smoker   . Hypertension   . Parasite infection 2016   "couldn't afford to have surgery done; just left it; lower legs"  . Uncontrolled type II diabetes mellitus ([place]) dx'd 2013    Patient Active Problem List   Diagnosis Date Noted  . Normochromic anemia   . Orthostatic hypertension 11/12/2018  . Diabetic peripheral neuropathy associated with type 2 diabetes mellitus ([place])   . Chronic diarrhea   . Uncontrolled type 2 diabetes mellitus with hyperglycemia ([place])   . Gastroenteritis due to norovirus 01/12/2018  . AKI (acute kidney injury) ([place]) 01/12/2018  . Type II diabetes mellitus with renal manifestations ([place]) 01/11/2018  . Absent pedal pulses 09/27/2017  . Syncope 01/13/2017  . Postural dizziness with presyncope   . CKD (chronic kidney disease) stage 3, GFR 30-59 ml/min (HCC) 06/16/2016  . Underweight 01/17/2016  . Autonomic neuropathy due to secondary diabetes ([place]) 11/29/2015  . Hyperglycemia 11/27/2015  . Hypotension 11/27/2015  . Vertigo 11/27/2015  . Orthostatic  hypotension 11/27/2015  . SVT (supraventricular tachycardia) ([place]) 11/11/2015  . Diarrhea 08/17/2014  . Protein-calorie malnutrition, severe ([place]) 07/26/2014  . History of tobacco use 06/27/2014  . Edentulism, complete 06/27/2014  . Language barrier to communication 06/27/2014  . HTN (hypertension) 06/27/2014  . Hyponatremia 05/13/2014  . Type 2 diabetes mellitus with hyperglycemia, with long-term current use of insulin ([place]) 05/13/2014    Past Surgical History:  Procedure Laterality Date  . NO PAST SURGERIES          Home Medications    Prior to Admission medications   Medication Sig Start Date End Date Taking? Authorizing Provider  ACCU-CHEK SOFTCLIX LANCETS lancets 1 each by Other route 3 (three) times daily. 08/13/18   Hayden Rasmussen, MD  acetaminophen (TYLENOL) 500 MG tablet Take 500 mg by mouth 2 (two) times daily.    [provider]  Blood Glucose Monitoring Suppl (ACCU-CHEK AVIVA PLUS) w/Device KIT 1 Device by Does not apply route 3 (three) times daily after meals. 08/13/18   Hayden Rasmussen, MD  glucose blood (ACCU-CHEK AVIVA PLUS) test strip 1 each by Other route 3 (three) times daily. 03/13/19   Duffy Bruce, MD  insulin glargine (LANTUS) 100 UNIT/ML injection Inject 0.09 mLs (9 Units total) into the skin at bedtime. 03/13/19   Duffy Bruce, MD  midodrine (PROAMATINE) 2.5 MG tablet Take 1 tablet (2.5 mg total) by mouth 3 (three) times daily with meals for 7 days. 03/13/19 03/20/19  Duffy Bruce, MD    Family History Family  History  Problem Relation Age of Onset  . Diabetes Other   . Diabetes Other     Social History Social History   Tobacco Use  . Smoking status: Former Smoker    Packs/day: 1.50    Years: 48.00    Pack years: 72.00    Types: Cigarettes    Last attempt to quit: 2015    Years since quitting: 5.3  . Smokeless tobacco: Never Used  Substance Use Topics  . Alcohol use: No  . Drug use: No     Allergies   Patient has no  known allergies.   Review of Systems Review of Systems  Constitutional: Positive for chills and fever.  HENT: Negative for ear pain and sore throat.   Eyes: Negative for pain and visual disturbance.  Respiratory: Negative for cough and shortness of breath.   Cardiovascular: Negative for chest pain and palpitations.  Gastrointestinal: Negative for abdominal pain and vomiting.  Genitourinary: Negative for dysuria and hematuria.  Musculoskeletal: Negative for arthralgias and back pain.  Skin: Negative for color change and rash.  Neurological: Positive for light-headedness. Negative for seizures and syncope.  All other systems reviewed and are negative.    Physical Exam Updated Vital Signs Pulse (!) 113   Temp (!) 100.5 F (38.1 C) (Oral)   Resp 19   SpO2 91%   Physical Exam Vitals signs and nursing note reviewed.  Constitutional:      Appearance: He is well-developed.  HENT:     Head: Normocephalic and atraumatic.     Comments: No mastoid tenderness or erythema    Right Ear: Tympanic membrane normal.     Left Ear: Tympanic membrane normal.  Eyes:     Extraocular Movements: Extraocular movements intact.     Conjunctiva/sclera: Conjunctivae normal.     Pupils: Pupils are equal, round, and reactive to light.  Neck:     Musculoskeletal: Neck supple.  Cardiovascular:     Rate and Rhythm: Regular rhythm. Tachycardia present.     Heart sounds: No murmur.  Pulmonary:     Effort: Pulmonary effort is normal. No respiratory distress.     Breath sounds: Normal breath sounds.  Abdominal:     Palpations: Abdomen is soft.     Tenderness: There is no abdominal tenderness.  Skin:    General: Skin is warm and dry.     Comments: Excoriations on the extensor surfaces of the second and third digit of the right hand No open wounds No sacral decubitus ulcers  Neurological:     General: No focal deficit present.     Mental Status: He is alert and oriented to person, place, and time.       ED Treatments / Results  Labs (all labs ordered are listed, but only abnormal results are displayed) Labs Reviewed  CULTURE, BLOOD (ROUTINE X 2)  CULTURE, BLOOD (ROUTINE X 2)  SARS CORONAVIRUS 2 (HOSPITAL ORDER, [place] [facility])  LACTIC ACID, PLASMA  LACTIC ACID, PLASMA  COMPREHENSIVE METABOLIC PANEL  CBC WITH DIFFERENTIAL/PLATELET  URINALYSIS, ROUTINE W REFLEX MICROSCOPIC  LIPASE, BLOOD  BLOOD GAS, ARTERIAL    EKG None  Radiology No results found.  Procedures Procedures (including critical care time)  Medications Ordered in ED Medications  lactated ringers bolus 1,000 mL (has no administration in time range)  cefTRIAXone (ROCEPHIN) 1 g in sodium chloride 0.9 % 100 mL IVPB (has no administration in time range)     Initial Impression / Assessment and Plan /  ED Course  I have reviewed the triage vital signs and the nursing notes.  Pertinent labs & imaging results that were available during my care of the patient were reviewed by me and considered in my medical decision making (see chart for details).  68 year old male with a history of CKD, HTN, orthostatic hypotension on Midrin and uncontrolled T2DM presents with fever.  Patient febrile to 100.5 and tachycardic on arrival.  Code sepsis initiated.  Dose of ceftriaxone given.  Blood and urine cultures obtained.   No headache or neck pain.  Doubt meningitis.  No signs of malignant otitis externa.  No open wounds.  No mastoid tenderness or erythema.  Benign abdominal exam.  Does have sats in the low 90s on room air however he is a chronic tobacco user.  I reviewed patient's chart.  He was evaluated in the ED on 5/15 with complaints of headache.  Had a CT head MRI that showed no acute intracranial abnormalities.  An LP was attempted unsuccessfully due to concerns for subarachnoid hemorrhage. He has no headache at this time.  2 Liters of LR given.   WBC 7.4, hemoglobin 10.5, platelets 127.  Lipase  24.  Lactic acid 3.2.  pH 7.39, PCO2 2, bicarb 17.7.   Chest x-ray shows multifocal indistinct and interstitial bilateral pulmonary opacities greater on the left highly suspicious for acute viral respiratory infection.  No pleural effusion.  Concerns for COVID.  Patient admitted for further management.  Final Clinical Impressions(s) / ED Diagnoses   Final diagnoses:  None    ED Discharge Orders    None       Trinidad Curet, MD 03/11/2019 2321    Julianne Rice, MD 03/20/19 1341

## 2019-03-17 NOTE — H&P (Addendum)
History and Physical    Martavion Paul AVW:[id number] DOB: [birth year] DOA: 03/28/2019  PCP: Alfonse Spruce, FNP Patient coming from: Home  Chief Complaint: Fevers, chills, generalized weakness  HPI: Javi Haese is a 68 y.o. male with medical history significant of hypertension, insulin-dependent diabetes mellitus, CKD 3, multiple hospital admissions for orthostatic hypotension thought to be secondary to poorly controlled diabetes and autonomic neuropathy, history of Midodrine noncompliance presenting to the hospital via EMS for evaluation of fevers, chills, and generalized weakness.  Systolic blood pressure 78, temperature 100.5 F, and respiratory rate 20 per EMS.  History obtained from patient very limited despite using interpreter services.  Patient states for the past 1 week he has not been feeling well and has been dizzy.  He is feeling weak and tired.  Denies any fevers, chills, cough, shortness of breath, chest pain, vomiting, abdominal pain, diarrhea, dysuria, or urinary frequency/urgency.  Denies any problems with urination.  Denies any sick contacts or exposure to any individual with COVID-19.  He does not seem know which medications he takes at home.  Patient was recently seen in the ED on May 17 for near syncope and fatigue.  Noted to have severe orthostatic hypotension.  Patient was taking midodrine twice a day rather than 3 times a day as prescribed.  He was discharged from the ED and advised to take Midrin 3 times a day plus closely monitor his blood glucose.  ED Course: Temperature 100.5 F, tachycardic, tachypneic, and hypotensive.  No leukocytosis.  Lactic acid 3.2.  Hemoglobin 10.5, close to baseline.  Platelet count 127.  Blood glucose 436.  Bicarb 18, anion gap 15.  BUN 41.  Creatinine 3.3, recent baseline 2.0.  AST 50, remainder of LFTs normal.  Lipase normal.  ABG with pH 7.34, PCO2 32, and PO2 60.  Chest x-ray showing multifocal indistinct and interstitial bilateral pulmonary  opacities greater on the left, highly suspicious for acute viral respiratory infection. Pending: UA, urine culture, COVID-19 rapid test, blood culture x2 Medications administered: 1L LR bolus, ceftriaxone, azithromycin  Review of Systems:  All systems reviewed and apart from history of presenting illness, are negative.  Past Medical History:  Diagnosis Date  . Bullae 11/11/2015   legs/notes 11/11/2015  . Chronic kidney disease   . Heavy cigarette smoker   . Hypertension   . Parasite infection 2016   "couldn't afford to have surgery done; just left it; lower legs"  . Uncontrolled type II diabetes mellitus ([place]) dx'd 2013    Past Surgical History:  Procedure Laterality Date  . NO PAST SURGERIES       reports that he quit smoking about 5 years ago. His smoking use included cigarettes. He has a 72.00 pack-year smoking history. He has never used smokeless tobacco. He reports that he does not drink alcohol or use drugs.  No Known Allergies  Family History  Problem Relation Age of Onset  . Diabetes Other   . Diabetes Other     Prior to Admission medications   Medication Sig Start Date End Date Taking? Authorizing Provider  ACCU-CHEK SOFTCLIX LANCETS lancets 1 each by Other route 3 (three) times daily. 08/13/18   Hayden Rasmussen, MD  acetaminophen (TYLENOL) 500 MG tablet Take 500 mg by mouth 2 (two) times daily.    [provider]  Blood Glucose Monitoring Suppl (ACCU-CHEK AVIVA PLUS) w/Device KIT 1 Device by Does not apply route 3 (three) times daily after meals. 08/13/18   Hayden Rasmussen, MD  glucose blood (ACCU-CHEK AVIVA PLUS) test strip 1 each by Other route 3 (three) times daily. 03/13/19   Duffy Bruce, MD  insulin glargine (LANTUS) 100 UNIT/ML injection Inject 0.09 mLs (9 Units total) into the skin at bedtime. 03/13/19   Duffy Bruce, MD  midodrine (PROAMATINE) 2.5 MG tablet Take 1 tablet (2.5 mg total) by mouth 3 (three) times daily with meals for 7 days.  03/13/19 03/20/19  Duffy Bruce, MD    Physical Exam: Vitals:   03/18/19 0004 03/18/19 0005 03/18/19 0030 03/18/19 0045  BP:   (!) 119/59 (!) 100/58  Pulse:   99 95  Resp:   (!) 22 (!) 26  Temp:      TempSrc:      SpO2: 90% 91% 96% 96%    Physical Exam  Constitutional: He is oriented to person, place, and time.  Frail, very lethargic  HENT:  Head: Normocephalic.  Dry mucous membranes  Eyes: Right eye exhibits no discharge. Left eye exhibits no discharge.  Neck: Neck supple.  Cardiovascular: Regular rhythm and intact distal pulses.  Slightly tachycardic  Pulmonary/Chest: Effort normal. He has no wheezes.  Diffuse mild rhonchi appreciated on auscultation On 4 L supplemental oxygen  Abdominal: Soft. Bowel sounds are normal. He exhibits no distension. There is no abdominal tenderness. There is no guarding.  Musculoskeletal:        General: No edema.  Neurological: He is alert and oriented to person, place, and time.  Skin: Skin is warm and dry.     Labs on Admission: I have personally reviewed following labs and imaging studies  CBC: Recent Labs  Lab 03/13/19 1642 03/15/19 1817 03/13/2019 2139 03/12/2019 2235  WBC 6.4 7.3 7.4  --   NEUTROABS 4.2  --  5.7  --   HGB 10.2* 11.1* 10.5* 9.5*  HCT 32.4* 33.9* 32.2* 28.0*  MCV 90.0 90.6 89.2  --   PLT 151 122* 127*  --    Basic Metabolic Panel: Recent Labs  Lab 03/13/19 1642 03/15/19 1817 03/01/2019 2139 03/03/2019 2235  NA 131* 130* 135 135  K 5.1 4.7 4.5 4.6  CL 98 97* 102  --   CO2 24 22 18*  --   GLUCOSE 286* 449* 436*  --   BUN 22 23 41*  --   CREATININE 2.18* 2.06* 3.36*  --   CALCIUM 9.1 8.2* 8.6*  --    GFR: Estimated Creatinine Clearance: 15.6 mL/min (A) (by C-G formula based on SCr of 3.36 mg/dL (H)). Liver Function Tests: Recent Labs  Lab 03/13/19 1642 03/22/2019 2139  AST 17 50*  ALT 15 19  ALKPHOS 90 59  BILITOT 0.6 0.7  PROT 7.0 6.8  ALBUMIN 3.3* 2.6*   Recent Labs  Lab 03/23/2019 2139   LIPASE 24   No results for input(s): AMMONIA in the last 168 hours. Coagulation Profile: Recent Labs  Lab 03/13/19 2018  INR 1.0   Cardiac Enzymes: No results for input(s): CKTOTAL, CKMB, CKMBINDEX, TROPONINI in the last 168 hours. BNP (last 3 results) No results for input(s): PROBNP in the last 8760 hours. HbA1C: No results for input(s): HGBA1C in the last 72 hours. CBG: Recent Labs  Lab 03/14/19 0739 03/15/19 1819 03/15/19 2056 03/18/19 0125  GLUCAP 351* 431* 340* 189*   Lipid Profile: No results for input(s): CHOL, HDL, LDLCALC, TRIG, CHOLHDL, LDLDIRECT in the last 72 hours. Thyroid Function Tests: No results for input(s): TSH, T4TOTAL, FREET4, T3FREE, THYROIDAB in the last 72 hours. Anemia Panel: No  results for input(s): VITAMINB12, FOLATE, FERRITIN, TIBC, IRON, RETICCTPCT in the last 72 hours. Urine analysis:    Component Value Date/Time   COLORURINE YELLOW 03/18/2019 0048   APPEARANCEUR CLEAR 03/18/2019 0048   LABSPEC 1.013 03/18/2019 0048   PHURINE 5.0 03/18/2019 0048   GLUCOSEU >=500 (A) 03/18/2019 0048   HGBUR LARGE (A) 03/18/2019 0048   BILIRUBINUR NEGATIVE 03/18/2019 0048   BILIRUBINUR neg 09/05/2017 1111   KETONESUR 5 (A) 03/18/2019 0048   PROTEINUR 100 (A) 03/18/2019 0048   UROBILINOGEN 0.2 09/05/2017 1111   UROBILINOGEN 0.2 07/22/2015 1941   NITRITE NEGATIVE 03/18/2019 0048   LEUKOCYTESUR NEGATIVE 03/18/2019 0048    Radiological Exams on Admission: Dg Chest Portable 1 View  Result Date: 03/19/2019 CLINICAL DATA:  68 year old male with fever, recently hospitalized. COVID-19 status pending today. EXAM: PORTABLE CHEST 1 VIEW COMPARISON:  12/11/2018 chest radiographs and earlier. FINDINGS: Portable AP semi upright view at 2230 hours. Confluent and indistinct left greater than right pulmonary patchy and interstitial opacity. No associated pleural effusion. Normal cardiac size and mediastinal contours. Visualized tracheal air column is within normal  limits. Negative visible bowel gas pattern. No acute osseous abnormality identified. IMPRESSION: Multifocal indistinct and interstitial bilateral pulmonary opacity greater on the left highly suspicious for acute viral respiratory infection. No pleural effusion. Electronically Signed   By: Genevie Ann M.D.   On: 03/01/2019 23:03    EKG: Independently reviewed.  Sinus tachycardia (heart rate 117), artifact.  Assessment/Plan Principal Problem:   Pneumonia due to COVID-19 virus Active Problems:   Type 2 diabetes mellitus with hyperglycemia, with long-term current use of insulin (HCC)   AKI (acute kidney injury) ([place])   Severe sepsis (HCC)   Acute respiratory failure with hypoxia (HCC)   Severe sepsis secondary to COVID-19 viral pneumonia Febrile. Tachycardic, tachypneic, and hypotensive. No leukocytosis.  Lactic acid 3.2. SARS-CoV-2 rapid test positive.  Chest x-ray showing multifocal indistinct and interstitial bilateral pulmonary opacities greater on the left, highly suspicious for acute viral respiratory infection.  Blood pressure now improved with systolic in 810F with 2 L IV fluid boluses. -Ad[facility] to [facility] -Supplemental oxygen -Received 2 L IVF boluses, BP now improved -Continue home Midodrine -Received ceftriaxone and azithromycin in the ED. His presentation is more consistent with a viral infection.  Check procalcitonin level. -Vitamin C 500 mg twice daily -Zinc 220 mg daily -Check CRP, d-dimer, ferritin, fibrinogen, LDH, troponin levels -Trend lactate -Blood culture x2 pending -UA not suggestive of infection, urine culture pending  Acute hypoxic respiratory failure secondary to COVID-19 viral pneumonia Oxygen saturation in the low 90s initially and was placed on 2 L supplemental oxygen.  Oxygen saturation continued to be low on 2 L supplemental oxygen. ABG with pH 7.34, PCO2 32, and PO2 60.  Currently on 4 L supplemental oxygen. -Continue  supplemental oxygen -Airborne and contact precautions  Hyperglycemia in the setting of insulin-dependent diabetes mellitus Severe sepsis probably contributing.  Unclear whether patient is compliant with home insulin.  Blood glucose 436 on initial labs.  Bicarb 18, anion gap 15.  Patient received 2 L IV fluid boluses.  Repeat CBG 189. -Check A1c -Sliding scale insulin every 4 hours and CBG checks -Repeat BMP pending  AKI on CKD 3 Likely prerenal secondary to severe sepsis and hypotension. BUN 41.  Creatinine 3.3, recent baseline 2.0.   -Received 2 L IVF boluses -Monitor urine output -Repeat renal function labs in a.m.  Chronic normocytic anemia Hemoglobin 10.5, close to  baseline.  No signs of active bleeding. -Continue monitor CBC  Mild thrombocytopenia Likely secondary to severe sepsis.  Platelet count 127.  No signs of active bleeding. -Continue to monitor CBC  Mild transaminitis Likely secondary to severe sepsis.  AST 50, remainder of LFTs normal.  -Continue to monitor LFTs  DVT prophylaxis: Subcutaneous heparin Code Status: Full code Family Communication: No family available. Disposition Plan: Anticipate discharge after clinical improvement. Consults called: None Admission status: It is my clinical opinion that admission to INPATIENT is reasonable and necessary in this 68 y.o. male . presenting with symptoms of fever, dizziness, generalized weakness, concerning for severe sepsis and acute hypoxic respiratory failure secondary to COVID-19 viral pneumonia, AKI on CKD 3, hyperglycemia in the setting of type 2 diabetes . in the context of PMH including: Type 2 diabetes, hypertension, CKD stage III . with pertinent positives on physical exam including: Lethargy, 4 L supplemental oxygen requirement . and pertinent positives on radiographic and laboratory data including: COVID-19 rapid test positive, evidence of AKI, hyperglycemia . Workup and treatment include IV fluid  resuscitation, supplemental oxygen, and work-up/treatment plan for COVID-19 mentioned above.  Given the aforementioned, the predictability of an adverse outcome is felt to be significant. I expect that the patient will require at least 2 midnights in the hospital to treat this condition.   The medical decision making on this patient was of high complexity and the patient is at high risk for clinical deterioration, therefore this is a level 3 visit.  Shela Leff MD [facility] Pager [phone]  If 7PM-7AM, please contact night-coverage [email or web address] Password [email or web address]  03/18/2019, 2:08 AM

## 2019-03-18 DIAGNOSIS — N183 Chronic kidney disease, stage 3 (moderate): Secondary | ICD-10-CM | POA: Diagnosis not present

## 2019-03-18 DIAGNOSIS — A4189 Other specified sepsis: Secondary | ICD-10-CM | POA: Diagnosis present

## 2019-03-18 DIAGNOSIS — N184 Chronic kidney disease, stage 4 (severe): Secondary | ICD-10-CM | POA: Diagnosis present

## 2019-03-18 DIAGNOSIS — I951 Orthostatic hypotension: Secondary | ICD-10-CM | POA: Diagnosis present

## 2019-03-18 DIAGNOSIS — J189 Pneumonia, unspecified organism: Secondary | ICD-10-CM | POA: Diagnosis not present

## 2019-03-18 DIAGNOSIS — R4182 Altered mental status, unspecified: Secondary | ICD-10-CM | POA: Diagnosis not present

## 2019-03-18 DIAGNOSIS — R740 Nonspecific elevation of levels of transaminase and lactic acid dehydrogenase [LDH]: Secondary | ICD-10-CM | POA: Diagnosis present

## 2019-03-18 DIAGNOSIS — J90 Pleural effusion, not elsewhere classified: Secondary | ICD-10-CM | POA: Diagnosis not present

## 2019-03-18 DIAGNOSIS — G9340 Encephalopathy, unspecified: Secondary | ICD-10-CM | POA: Diagnosis not present

## 2019-03-18 DIAGNOSIS — J069 Acute upper respiratory infection, unspecified: Secondary | ICD-10-CM | POA: Diagnosis not present

## 2019-03-18 DIAGNOSIS — R6521 Severe sepsis with septic shock: Secondary | ICD-10-CM | POA: Diagnosis not present

## 2019-03-18 DIAGNOSIS — I471 Supraventricular tachycardia: Secondary | ICD-10-CM | POA: Diagnosis present

## 2019-03-18 DIAGNOSIS — D62 Acute posthemorrhagic anemia: Secondary | ICD-10-CM | POA: Diagnosis not present

## 2019-03-18 DIAGNOSIS — Z794 Long term (current) use of insulin: Secondary | ICD-10-CM | POA: Diagnosis not present

## 2019-03-18 DIAGNOSIS — D6959 Other secondary thrombocytopenia: Secondary | ICD-10-CM | POA: Diagnosis present

## 2019-03-18 DIAGNOSIS — J80 Acute respiratory distress syndrome: Secondary | ICD-10-CM | POA: Diagnosis present

## 2019-03-18 DIAGNOSIS — R6520 Severe sepsis without septic shock: Secondary | ICD-10-CM | POA: Diagnosis present

## 2019-03-18 DIAGNOSIS — E1165 Type 2 diabetes mellitus with hyperglycemia: Secondary | ICD-10-CM | POA: Diagnosis present

## 2019-03-18 DIAGNOSIS — E1122 Type 2 diabetes mellitus with diabetic chronic kidney disease: Secondary | ICD-10-CM | POA: Diagnosis present

## 2019-03-18 DIAGNOSIS — E11649 Type 2 diabetes mellitus with hypoglycemia without coma: Secondary | ICD-10-CM | POA: Diagnosis not present

## 2019-03-18 DIAGNOSIS — I959 Hypotension, unspecified: Secondary | ICD-10-CM | POA: Diagnosis not present

## 2019-03-18 DIAGNOSIS — Z66 Do not resuscitate: Secondary | ICD-10-CM | POA: Diagnosis not present

## 2019-03-18 DIAGNOSIS — E872 Acidosis: Secondary | ICD-10-CM | POA: Diagnosis present

## 2019-03-18 DIAGNOSIS — E1143 Type 2 diabetes mellitus with diabetic autonomic (poly)neuropathy: Secondary | ICD-10-CM | POA: Diagnosis present

## 2019-03-18 DIAGNOSIS — J181 Lobar pneumonia, unspecified organism: Secondary | ICD-10-CM | POA: Diagnosis not present

## 2019-03-18 DIAGNOSIS — R918 Other nonspecific abnormal finding of lung field: Secondary | ICD-10-CM | POA: Diagnosis not present

## 2019-03-18 DIAGNOSIS — J810 Acute pulmonary edema: Secondary | ICD-10-CM | POA: Diagnosis not present

## 2019-03-18 DIAGNOSIS — N281 Cyst of kidney, acquired: Secondary | ICD-10-CM | POA: Diagnosis not present

## 2019-03-18 DIAGNOSIS — N179 Acute kidney failure, unspecified: Secondary | ICD-10-CM | POA: Diagnosis present

## 2019-03-18 DIAGNOSIS — G92 Toxic encephalopathy: Secondary | ICD-10-CM | POA: Diagnosis present

## 2019-03-18 DIAGNOSIS — Z4682 Encounter for fitting and adjustment of non-vascular catheter: Secondary | ICD-10-CM | POA: Diagnosis not present

## 2019-03-18 DIAGNOSIS — J1289 Other viral pneumonia: Secondary | ICD-10-CM | POA: Diagnosis present

## 2019-03-18 DIAGNOSIS — U071 COVID-19: Secondary | ICD-10-CM | POA: Diagnosis present

## 2019-03-18 DIAGNOSIS — I129 Hypertensive chronic kidney disease with stage 1 through stage 4 chronic kidney disease, or unspecified chronic kidney disease: Secondary | ICD-10-CM | POA: Diagnosis present

## 2019-03-18 DIAGNOSIS — N200 Calculus of kidney: Secondary | ICD-10-CM | POA: Diagnosis not present

## 2019-03-18 DIAGNOSIS — Z9119 Patient's noncompliance with other medical treatment and regimen: Secondary | ICD-10-CM | POA: Diagnosis not present

## 2019-03-18 DIAGNOSIS — Z87891 Personal history of nicotine dependence: Secondary | ICD-10-CM | POA: Diagnosis not present

## 2019-03-18 DIAGNOSIS — A419 Sepsis, unspecified organism: Secondary | ICD-10-CM

## 2019-03-18 DIAGNOSIS — R0902 Hypoxemia: Secondary | ICD-10-CM | POA: Diagnosis not present

## 2019-03-18 DIAGNOSIS — J9601 Acute respiratory failure with hypoxia: Secondary | ICD-10-CM

## 2019-03-18 DIAGNOSIS — R55 Syncope and collapse: Secondary | ICD-10-CM | POA: Diagnosis not present

## 2019-03-18 LAB — BLOOD CULTURE ID PANEL (REFLEXED)

## 2019-03-18 LAB — BASIC METABOLIC PANEL
Anion gap: 13 (ref 5–15)
BUN: 42 mg/dL — ABNORMAL HIGH (ref 8–23)
CO2: 16 mmol/L — ABNORMAL LOW (ref 22–32)
Calcium: 7.9 mg/dL — ABNORMAL LOW (ref 8.9–10.3)
Chloride: 105 mmol/L (ref 98–111)
Creatinine, Ser: 3 mg/dL — ABNORMAL HIGH (ref 0.61–1.24)
GFR calc Af Amer: 24 mL/min — ABNORMAL LOW (ref >60)
GFR calc non Af Amer: 20 mL/min — ABNORMAL LOW (ref >60)
Glucose, Bld: 414 mg/dL — ABNORMAL HIGH (ref 70–99)
Potassium: 4.4 mmol/L (ref 3.5–5.1)
Sodium: 134 mmol/L — ABNORMAL LOW (ref 135–145)

## 2019-03-18 LAB — GLUCOSE, CAPILLARY
Glucose-Capillary: 135 mg/dL — ABNORMAL HIGH (ref 70–99)
Glucose-Capillary: 137 mg/dL — ABNORMAL HIGH (ref 70–99)
Glucose-Capillary: 161 mg/dL — ABNORMAL HIGH (ref 70–99)
Glucose-Capillary: 168 mg/dL — ABNORMAL HIGH (ref 70–99)
Glucose-Capillary: 173 mg/dL — ABNORMAL HIGH (ref 70–99)
Glucose-Capillary: 181 mg/dL — ABNORMAL HIGH (ref 70–99)
Glucose-Capillary: 373 mg/dL — ABNORMAL HIGH (ref 70–99)

## 2019-03-18 LAB — POCT I-STAT 7, (LYTES, BLD GAS, ICA,H+H)
Acid-base deficit: 6 mmol/L — ABNORMAL HIGH (ref 0.0–2.0)
Bicarbonate: 20.2 mmol/L (ref 20.0–28.0)
Calcium, Ion: 1.23 mmol/L (ref 1.15–1.40)
HCT: 25 % — ABNORMAL LOW (ref 39.0–52.0)
Hemoglobin: 8.5 g/dL — ABNORMAL LOW (ref 13.0–17.0)
O2 Saturation: 85 %
Patient temperature: 99.4
Potassium: 4.1 mmol/L (ref 3.5–5.1)
Sodium: 141 mmol/L (ref 135–145)
TCO2: 21 mmol/L — ABNORMAL LOW (ref 22–32)
pCO2 arterial: 43.4 mmHg (ref 32.0–48.0)
pH, Arterial: 7.277 — ABNORMAL LOW (ref 7.350–7.450)
pO2, Arterial: 57 mmHg — ABNORMAL LOW (ref 83.0–108.0)

## 2019-03-18 LAB — TROPONIN I: Troponin I: 0.04 ng/mL (ref <0.03)

## 2019-03-18 LAB — URINALYSIS, ROUTINE W REFLEX MICROSCOPIC
Bilirubin Urine: NEGATIVE
Glucose, UA: >=500 mg/dL — AB
Ketones, ur: 5 mg/dL — AB
Leukocytes,Ua: NEGATIVE
Nitrite: NEGATIVE
Protein, ur: 100 mg/dL — AB
Specific Gravity, Urine: 1.013 (ref 1.005–1.030)
pH: 5 (ref 5.0–8.0)

## 2019-03-18 LAB — HEMOGLOBIN A1C
Hgb A1c MFr Bld: 14.9 % — ABNORMAL HIGH (ref 4.8–5.6)
Hgb A1c MFr Bld: 14.9 % — ABNORMAL HIGH (ref 4.8–5.6)
Mean Plasma Glucose: 380.93 mg/dL
Mean Plasma Glucose: 380.93 mg/dL

## 2019-03-18 LAB — C-REACTIVE PROTEIN: CRP: 30.8 mg/dL — ABNORMAL HIGH (ref <1.0)

## 2019-03-18 LAB — ABO/RH: ABO/RH(D): B POS

## 2019-03-18 LAB — PROCALCITONIN: Procalcitonin: 88.27 ng/mL

## 2019-03-18 LAB — LACTIC ACID, PLASMA
Lactic Acid, Venous: 1.5 mmol/L (ref 0.5–1.9)
Lactic Acid, Venous: 2.1 mmol/L (ref 0.5–1.9)

## 2019-03-18 LAB — CBG MONITORING, ED: Glucose-Capillary: 189 mg/dL — ABNORMAL HIGH (ref 70–99)

## 2019-03-18 LAB — SARS CORONAVIRUS 2 BY RT PCR (HOSPITAL ORDER, PERFORMED IN ~~LOC~~ HOSPITAL LAB): SARS Coronavirus 2: POSITIVE — AB

## 2019-03-18 LAB — FIBRINOGEN: Fibrinogen: 674 mg/dL — ABNORMAL HIGH (ref 210–475)

## 2019-03-18 LAB — FERRITIN: Ferritin: 312 ng/mL (ref 24–336)

## 2019-03-18 LAB — D-DIMER, QUANTITATIVE: D-Dimer, Quant: 1.93 ug/mL-FEU — ABNORMAL HIGH (ref 0.00–0.50)

## 2019-03-18 LAB — LACTATE DEHYDROGENASE: LDH: 232 U/L — ABNORMAL HIGH (ref 98–192)

## 2019-03-18 MED ORDER — ZINC SULFATE 220 (50 ZN) MG PO CAPS
220.0000 mg | ORAL_CAPSULE | Freq: Every day | ORAL | Status: DC
  Administered 2019-03-18 – 2019-03-21 (×4): 220 mg via ORAL
  Filled 2019-03-18 (×4): qty 1

## 2019-03-18 MED ORDER — INSULIN DETEMIR 100 UNIT/ML ~~LOC~~ SOLN
10.0000 [IU] | Freq: Two times a day (BID) | SUBCUTANEOUS | Status: DC
  Filled 2019-03-18: qty 0.1

## 2019-03-18 MED ORDER — ACETAMINOPHEN 325 MG PO TABS
650.0000 mg | ORAL_TABLET | Freq: Four times a day (QID) | ORAL | Status: DC | PRN
  Administered 2019-03-18: 650 mg via ORAL
  Filled 2019-03-18: qty 2

## 2019-03-18 MED ORDER — HEPARIN SODIUM (PORCINE) 5000 UNIT/ML IJ SOLN
5000.0000 [IU] | Freq: Three times a day (TID) | INTRAMUSCULAR | Status: DC
  Administered 2019-03-18 – 2019-03-22 (×14): 5000 [IU] via SUBCUTANEOUS
  Filled 2019-03-18 (×13): qty 1

## 2019-03-18 MED ORDER — SODIUM CHLORIDE 0.9 % IV BOLUS
1000.0000 mL | Freq: Once | INTRAVENOUS | Status: AC
  Administered 2019-03-18: 1000 mL via INTRAVENOUS

## 2019-03-18 MED ORDER — MIDODRINE HCL 2.5 MG PO TABS
2.5000 mg | ORAL_TABLET | Freq: Three times a day (TID) | ORAL | Status: DC
  Administered 2019-03-18 (×2): 2.5 mg via ORAL
  Filled 2019-03-18 (×6): qty 1

## 2019-03-18 MED ORDER — SODIUM CHLORIDE 0.9 % IV SOLN
INTRAVENOUS | Status: AC
  Administered 2019-03-18: 12:00:00 via INTRAVENOUS

## 2019-03-18 MED ORDER — INSULIN ASPART 100 UNIT/ML ~~LOC~~ SOLN: 0.0000 [IU] | Freq: Every day | SUBCUTANEOUS | Status: DC

## 2019-03-18 MED ORDER — VANCOMYCIN HCL 10 G IV SOLR
1250.0000 mg | Freq: Once | INTRAVENOUS | Status: AC
  Administered 2019-03-18: 1250 mg via INTRAVENOUS
  Filled 2019-03-18: qty 1250

## 2019-03-18 MED ORDER — SODIUM CHLORIDE 0.9 % IV SOLN
INTRAVENOUS | Status: DC
  Administered 2019-03-18: 04:00:00 via INTRAVENOUS

## 2019-03-18 MED ORDER — ACETAMINOPHEN 650 MG RE SUPP: 650.0000 mg | RECTAL | Status: DC | PRN

## 2019-03-18 MED ORDER — MIDODRINE HCL 5 MG PO TABS
5.0000 mg | ORAL_TABLET | Freq: Three times a day (TID) | ORAL | Status: DC
  Administered 2019-03-18 – 2019-03-21 (×9): 5 mg via ORAL
  Filled 2019-03-18 (×13): qty 1

## 2019-03-18 MED ORDER — VITAMIN C 500 MG PO TABS
500.0000 mg | ORAL_TABLET | Freq: Two times a day (BID) | ORAL | Status: DC
  Administered 2019-03-18 – 2019-03-21 (×8): 500 mg via ORAL
  Filled 2019-03-18 (×8): qty 1

## 2019-03-18 MED ORDER — SODIUM CHLORIDE 0.9 % IV SOLN
1.0000 g | Freq: Two times a day (BID) | INTRAVENOUS | Status: DC
  Administered 2019-03-18 – 2019-03-19 (×3): 1 g via INTRAVENOUS
  Filled 2019-03-18 (×4): qty 1

## 2019-03-18 MED ORDER — VANCOMYCIN HCL IN DEXTROSE 750-5 MG/150ML-% IV SOLN
750.0000 mg | INTRAVENOUS | Status: DC
  Administered 2019-03-20: 750 mg via INTRAVENOUS
  Filled 2019-03-18: qty 150

## 2019-03-18 MED ORDER — SODIUM CHLORIDE 0.9 % IV BOLUS
500.0000 mL | Freq: Once | INTRAVENOUS | Status: AC
  Administered 2019-03-18: 500 mL via INTRAVENOUS

## 2019-03-18 MED ORDER — INSULIN ASPART 100 UNIT/ML ~~LOC~~ SOLN
0.0000 [IU] | SUBCUTANEOUS | Status: DC
  Administered 2019-03-18 – 2019-03-19 (×2): 2 [IU] via SUBCUTANEOUS
  Administered 2019-03-19: 5 [IU] via SUBCUTANEOUS
  Administered 2019-03-19 (×2): 3 [IU] via SUBCUTANEOUS
  Administered 2019-03-19: 2 [IU] via SUBCUTANEOUS
  Administered 2019-03-20: 7 [IU] via SUBCUTANEOUS
  Administered 2019-03-20 (×2): 3 [IU] via SUBCUTANEOUS
  Administered 2019-03-20: 5 [IU] via SUBCUTANEOUS

## 2019-03-18 MED ORDER — SODIUM CHLORIDE 0.9 % IV SOLN
INTRAVENOUS | Status: DC
  Administered 2019-03-18: 10:00:00 via INTRAVENOUS

## 2019-03-18 MED ORDER — INSULIN ASPART 100 UNIT/ML ~~LOC~~ SOLN
0.0000 [IU] | Freq: Three times a day (TID) | SUBCUTANEOUS | Status: DC
  Administered 2019-03-18: 2 [IU] via SUBCUTANEOUS
  Administered 2019-03-18: 1 [IU] via SUBCUTANEOUS

## 2019-03-18 MED ORDER — INSULIN ASPART 100 UNIT/ML ~~LOC~~ SOLN
0.0000 [IU] | SUBCUTANEOUS | Status: DC
  Administered 2019-03-18: 9 [IU] via SUBCUTANEOUS

## 2019-03-18 NOTE — ED Notes (Signed)
Rn left Phone message for family.

## 2019-03-18 NOTE — Progress Notes (Signed)
Pharmacy Antibiotic Note  Brian Owen is a 68 y.o. male admitted on 03/07/2019 with pneumonia.  Pharmacy has been consulted for Vancomycin, Cefepime dosing.  PCT 88.27 LA 3.2, 1.5 CRP 30.8 WBC 7.4 Tm 101 SCr 3 (baseline ~2)  Plan:  Cefepime 1g IV q12h x8 days  Vancomycin 1250 mg IV x1, then 750 mg IV q48h  Goal AUC 400-550.  Expected AUC: 470 using SCr 3  Follow up renal function, culture results, and clinical course.   Height: 5\' 1"  (154.9 cm) Weight: 128 lb 8.5 oz (58.3 kg) IBW/kg (Calculated) : 52.3  Temp (24hrs), Avg:99.8 F (37.7 C), Min:98 F (36.7 C), Max:101 F (38.3 C)  Recent Labs  Lab 03/13/19 1642 03/15/19 1817 03/20/2019 2139 03/07/2019 2155 03/16/2019 2355 03/18/19 0118  WBC 6.4 7.3 7.4  --   --   --   CREATININE 2.18* 2.06* 3.36*  --   --  3.00*  LATICACIDVEN  --   --   --  3.2* 1.5  --     Estimated Creatinine Clearance: 17.4 mL/min (A) (by C-G formula based on SCr of 3 mg/dL (H)).    No Known Allergies  Antimicrobials this admission: 5/19 Azithromycin x1 5/19 Ceftriaxone x1 5/20 Vancomycin >>  5/20 Cefepime >>   Dose adjustments this admission:   Microbiology results: 5/19 SARS Coronavirus 2: positive 5/19 BCx:  5/20 UCx: 5/20 Tracheal aspirate 5/20 MRSA PCR:  Thank you for allowing pharmacy to be a part of this patient's care.  Gretta Arab PharmD, BCPS 03/18/2019 9:35 AM

## 2019-03-18 NOTE — Progress Notes (Signed)
Inpatient Diabetes Program Recommendations  AACE/ADA: New Consensus Statement on Inpatient Glycemic Control (2015)  Target Ranges:  Prepandial:   less than 140 mg/dL      Peak postprandial:   less than 180 mg/dL (1-2 hours)      Critically ill patients:  140 - 180 mg/dL   Lab Results  Component Value Date   GLUCAP 173 (H) 03/18/2019   HGBA1C 14.9 (H) 03/18/2019    Review of Glycemic Control  Diabetes history: DM2 Outpatient Diabetes medications: Lantus 9 units QHS Current orders for Inpatient glycemic control: Novolog 0-9 units tidwc and hs  HgbA1C - 14.9% - uncontrolled Pt is NPO - Needs Q4H Novolog  Inpatient Diabetes Program Recommendations:     Consider COVID 19 Glycemic Control Order Set with Novolog 0-9 units Q4H.  Will follow.  Thank you. Lorenda Peck, RD, LDN, CDE Inpatient Diabetes Coordinator [phone]

## 2019-03-18 NOTE — Progress Notes (Signed)
Pt has not urinated since the AM, denies any urge to go or pain when pressing on bladder. Bladder scan shows >560 in bladder. Dr. Venetia Constable notified. Will continue to monitor patient.

## 2019-03-18 NOTE — Progress Notes (Signed)
Contacted pt daughter Daiden Coltrane to update on pt condition. Answered all questions.

## 2019-03-18 NOTE — Progress Notes (Signed)
Notified by [facility] of gram positive rods growing in aerobic bottle of one blood culture from 03/18/2019, no BCID will be run per policy. Patient currently on vancomycin and cefepime. Notified [facility] Derrill Kay).  No new orders at this time.  Peggyann Juba, PharmD, BCPS Pharmacy: [phone] 03/18/2019 9:48 PM

## 2019-03-18 NOTE — Progress Notes (Signed)
Entered pt room for report to find pt shivering and febrile. Pt oriented and able to respond to questions. Administered tylenol and pt able to rest comfortably. Entered pt room 30 min later to find pt somnolent and lethargic, only responsive to pain. BP 77/46 and Map 56 Dr. Venetia Constable also present in room and ordered 500 ML fluid bolus and pt placed in trendelenberg. Pt alert and oriented after a few min in trendelenberg. Will continue to monitor.

## 2019-03-18 NOTE — Progress Notes (Signed)
PHARMACY - PHYSICIAN COMMUNICATION CRITICAL VALUE ALERT - BLOOD CULTURE IDENTIFICATION (BCID)  Brian Owen is an 68 y.o. male who presented to [facility] on 03/18/2019 with a chief complaint of COVID-19.  Assessment:  Pt with MMP who was admitted for COVID. Lab called with BCID result of 1/4 bottles with staph species that is likely to be CNS. Therefore, it's likely to be a contaminant. He is currently on vanc/cefepime for PNA.   Name of physician (or Provider) Contacted: Dr. Aileen Fass  Current antibiotics: Vanc/cefepime  Changes to prescribed antibiotics recommended:  Continue current abx  Results for orders placed or performed during the hospital encounter of 03/25/2019  Blood Culture ID Panel (Reflexed) (Collected: 03/14/2019 10:03 PM)  Result Value Ref Range   Enterococcus species NOT DETECTED NOT DETECTED   Listeria monocytogenes NOT DETECTED NOT DETECTED   Staphylococcus species DETECTED (A) NOT DETECTED   Staphylococcus aureus (BCID) NOT DETECTED NOT DETECTED   Methicillin resistance DETECTED (A) NOT DETECTED   Streptococcus species NOT DETECTED NOT DETECTED   Streptococcus agalactiae NOT DETECTED NOT DETECTED   Streptococcus pneumoniae NOT DETECTED NOT DETECTED   Streptococcus pyogenes NOT DETECTED NOT DETECTED   Acinetobacter baumannii NOT DETECTED NOT DETECTED   Enterobacteriaceae species NOT DETECTED NOT DETECTED   Enterobacter cloacae complex NOT DETECTED NOT DETECTED   Escherichia coli NOT DETECTED NOT DETECTED   Klebsiella oxytoca NOT DETECTED NOT DETECTED   Klebsiella pneumoniae NOT DETECTED NOT DETECTED   Proteus species NOT DETECTED NOT DETECTED   Serratia marcescens NOT DETECTED NOT DETECTED   Haemophilus influenzae NOT DETECTED NOT DETECTED   Neisseria meningitidis NOT DETECTED NOT DETECTED   Pseudomonas aeruginosa NOT DETECTED NOT DETECTED   Candida albicans NOT DETECTED NOT DETECTED   Candida glabrata NOT DETECTED NOT DETECTED   Candida krusei NOT DETECTED  NOT DETECTED   Candida parapsilosis NOT DETECTED NOT DETECTED   Candida tropicalis NOT DETECTED NOT DETECTED   Onnie Boer, PharmD, BCIDP, AAHIVP, CPP Infectious Disease Pharmacist 03/18/2019 4:10 PM

## 2019-03-18 NOTE — Progress Notes (Signed)
[facility] PROGRESS NOTE    Progress Note  Brian Owen  RJJ:[id number] DOB: [birth year] DOA: 03/18/2019 PCP: Alfonse Spruce, FNP     Brief Narrative:   Brian Owen is an 68 y.o. male past medical history significant for essential hypertension insulin-dependent diabetes, chronic kidney disease stage III with multiple admissions in the past for orthostatic hypotension thought to be autonomic dysfunction on midodrine who is noncompliant with it comes into the hospital for fever generalized weakness hypotension and tachypneic. Patient relates he has not been feeling well for about a week tired all the time, with cough shortness of breath vomiting and diarrhea.,  He was seen on 03/15/2023 orthostatic hypotension was given fluids started Middaugh drain and discharged home. In the ED: He was found to be febrile with no leukocytosis hypotensive lactic acid of 3.2 hyperglycemia mild anion gap acidosis, with a creatinine of 3.3 (with a baseline 2.0) ABG showed 7.34/30 2/60 with a chest x-ray that showed multifocal interstitial opacities.  Blood cultures were drawn he was given a liter of normal saline started empirically on Rocephin and azithromycin  Assessment/Plan:   Acute respiratory failure with hypoxia due to  Pneumonia due to COVID-19 virus Sirs: Has multiple bowel risk factors for decompensation, ARDS and multiorgan failure. Continue home dose of midodrine. His procalcitonin was 88, will continue empiric IV Rocephin and azithromycin. Continue vitamin C and zinc. Cultures and urine cultures are pending. His procalcitonin was 88, he is started empirically on IV vancomycin and cefepime. Blood cultures, UA and chest x-ray have been ordered. Inflammatory markers: COVID-19 Labs  Recent Labs    03/18/19 0118  DDIMER 1.93*  FERRITIN 312  LDH 232*  CRP 30.8*    Lab Results  Component Value Date   SARSCOV2NAA POSITIVE (A) 01/60/1093    Acute metabolic encephalopathy due to  hypotension: Question due to orthostasis he was placed in Trendelenburg and he was 500 cc bolus and is mentation improved.  Type 2 diabetes mellitus with hyperglycemia, with long-term current use of insulin (HCC) Significant hyperglycemia. Continue sliding scale insulin keep him n.p.o.  AKI (acute kidney injury) ([place]): Unclear baseline probably ranging from 1.5-2.0.  Admission his creatinine was 3.3, he is being fluid resuscitated, will continue IV fluids and recheck a basic metabolic panel in the morning.  Elevated cardiac biomarkers: Marginally elevated, after his mentation improved he denies any chest pain.   DVT prophylaxis: lovenox Family Communication:daughter and wife Disposition Plan/Barrier to D/C: Once his respiratory status is improved. Code Status:     Code Status Orders  (From admission, onward)         Start     Ordered   03/18/19 0044  Full code  Continuous     03/18/19 0046        Code Status History    Date Active Date Inactive Code Status Order ID Comments User Context   12/04/2018 1741 12/05/2018 2034 Full Code [id number]  Alphonzo Grieve, MD Inpatient   11/11/2018 1620 11/13/2018 2038 Full Code [id number]  Ina Homes, MD ED   01/11/2018 0131 01/12/2018 2052 Full Code [id number]  Ivor Costa, MD ED   01/13/2017 2200 01/16/2017 2202 Full Code [id number]  Sela Hilding, MD Inpatient   08/27/2016 1559 08/29/2016 1705 Full Code [id number]  Jule Ser, DO Inpatient   06/16/2016 0259 06/20/2016 1807 Full Code [id number]  Toy Baker, MD Inpatient   11/27/2015 1052 11/29/2015 1614 Full Code [id number]  Willia Craze, NP Inpatient   11/11/2015 1908 11/13/2015 1855 Full Code  [id number]  Elgergawy, Silver Huguenin, MD Inpatient   07/22/2015 1924 07/24/2015 1355 Full Code [id number]  Etta Quill, DO ED   09/10/2014 1210 09/12/2014 1901 Full Code [id number]  Velvet Bathe, MD Inpatient   07/22/2014 1642 07/29/2014 0020 Full Code [id number]  Hosie Poisson, MD Inpatient    05/15/2014 1453 05/16/2014 1241 Full Code [id number]  Azzie Roup, MD Inpatient   05/13/2014 2153 05/15/2014 1453 Full Code [id number]  Orson Eva, MD Inpatient        IV Access:    Peripheral IV   Procedures and diagnostic studies:   Dg Chest Portable 1 View  Result Date: 03/07/2019 CLINICAL DATA:  68 year old male with fever, recently hospitalized. COVID-19 status pending today. EXAM: PORTABLE CHEST 1 VIEW COMPARISON:  12/11/2018 chest radiographs and earlier. FINDINGS: Portable AP semi upright view at 2230 hours. Confluent and indistinct left greater than right pulmonary patchy and interstitial opacity. No associated pleural effusion. Normal cardiac size and mediastinal contours. Visualized tracheal air column is within normal limits. Negative visible bowel gas pattern. No acute osseous abnormality identified. IMPRESSION: Multifocal indistinct and interstitial bilateral pulmonary opacity greater on the left highly suspicious for acute viral respiratory infection. No pleural effusion. Electronically Signed   By: Genevie Ann M.D.   On: 03/07/2019 23:03     Medical Consultants:    None.  Anti-Infectives:   IV vancomycin and cefepime.  Subjective:    Brian Owen initially he was confused and lethargic. After a liter of normal saline and putting in Trendelenburg he is more awake denies any chest pain.  Objective:    Vitals:   03/18/19 0245 03/18/19 0331 03/18/19 0400 03/18/19 0423  BP: 118/67 139/76    Pulse:      Resp: (!) 23     Temp:   98 F (36.7 C)   TempSrc:      SpO2: 91%     Weight:    58.3 kg  Height:    5\' 1"  (1.549 m)   No intake or output data in the 24 hours ending 03/18/19 0719 Filed Weights   03/18/19 0423  Weight: 58.3 kg    Exam: General exam: In no acute distress. Respiratory system: Good air movement and clear to auscultation. Cardiovascular system: S1 & S2 heard, RRR.  Gastrointestinal system: Abdomen is nondistended, soft and nontender.   Central nervous system: Alert and oriented. No focal neurological deficits. Extremities: No pedal edema. Skin: No rashes, lesions or ulcers   Data Reviewed:    Labs: Basic Metabolic Panel: Recent Labs  Lab 03/13/19 1642 03/15/19 1817 03/20/2019 2139 03/19/2019 2235 03/18/19 0118  NA 131* 130* 135 135 134*  K 5.1 4.7 4.5 4.6 4.4  CL 98 97* 102  --  105  CO2 24 22 18*  --  16*  GLUCOSE 286* 449* 436*  --  414*  BUN 22 23 41*  --  42*  CREATININE 2.18* 2.06* 3.36*  --  3.00*  CALCIUM 9.1 8.2* 8.6*  --  7.9*   GFR Estimated Creatinine Clearance: 17.4 mL/min (A) (by C-G formula based on SCr of 3 mg/dL (H)). Liver Function Tests: Recent Labs  Lab 03/13/19 1642 02/28/2019 2139  AST 17 50*  ALT 15 19  ALKPHOS 90 59  BILITOT 0.6 0.7  PROT 7.0 6.8  ALBUMIN 3.3* 2.6*   Recent Labs  Lab 03/07/2019 2139  LIPASE 24   No results for input(s): AMMONIA in the last 168 hours. Coagulation profile Recent Labs  Lab 03/13/19  2018  INR 1.0   COVID-19 Labs  Recent Labs    03/18/19 0118  DDIMER 1.93*  FERRITIN 312  LDH 232*  CRP 30.8*    Lab Results  Component Value Date   SARSCOV2NAA POSITIVE (A) 03/02/2019    CBC: Recent Labs  Lab 03/13/19 1642 03/15/19 1817 03/29/2019 2139 02/27/2019 2235  WBC 6.4 7.3 7.4  --   NEUTROABS 4.2  --  5.7  --   HGB 10.2* 11.1* 10.5* 9.5*  HCT 32.4* 33.9* 32.2* 28.0*  MCV 90.0 90.6 89.2  --   PLT 151 122* 127*  --    Cardiac Enzymes: Recent Labs  Lab 03/18/19 0118  TROPONINI 0.04*   BNP (last 3 results) No results for input(s): PROBNP in the last 8760 hours. CBG: Recent Labs  Lab 03/14/19 0739 03/15/19 1819 03/15/19 2056 03/18/19 0125 03/18/19 0355  GLUCAP 351* 431* 340* 189* 373*   D-Dimer: Recent Labs    03/18/19 0118  DDIMER 1.93*   Hgb A1c: No results for input(s): HGBA1C in the last 72 hours. Lipid Profile: No results for input(s): CHOL, HDL, LDLCALC, TRIG, CHOLHDL, LDLDIRECT in the last 72 hours. Thyroid  function studies: No results for input(s): TSH, T4TOTAL, T3FREE, THYROIDAB in the last 72 hours.  Invalid input(s): FREET3 Anemia work up: Recent Labs    03/18/19 0118  FERRITIN 312   Sepsis Labs: Recent Labs  Lab 03/13/19 1642 03/15/19 1817 03/11/2019 2139 03/27/2019 2155 03/05/2019 2355 03/18/19 0118  PROCALCITON  --   --   --   --   --  88.27  WBC 6.4 7.3 7.4  --   --   --   LATICACIDVEN  --   --   --  3.2* 1.5  --    Microbiology Recent Results (from the past 240 hour(s))  SARS Coronavirus 2 (CEPHEID- Performed in [place] hospital lab), Hosp Order     Status: Abnormal   Collection Time: 03/11/2019 11:52 PM  Result Value Ref Range Status   SARS Coronavirus 2 POSITIVE (A) NEGATIVE Final    Comment: RESULT CALLED TO, READ BACK BY AND VERIFIED WITH: P JOHNSTON RN 03/17/18 0131 JDW (NOTE) If result is NEGATIVE SARS-CoV-2 target nucleic acids are NOT DETECTED. The SARS-CoV-2 RNA is generally detectable in upper and lower  respiratory specimens during the acute phase of infection. The lowest  concentration of SARS-CoV-2 viral copies this assay can detect is 250  copies / mL. A negative result does not preclude SARS-CoV-2 infection  and should not be used as the sole basis for treatment or other  patient management decisions.  A negative result may occur with  improper specimen collection / handling, submission of specimen other  than nasopharyngeal swab, presence of viral mutation(s) within the  areas targeted by this assay, and inadequate number of viral copies  (<250 copies / mL). A negative result must be combined with clinical  observations, patient history, and epidemiological information. If result is POSITIVE SARS-CoV-2 target nucleic acids are DETECTED. The SA RS-CoV-2 RNA is generally detectable in upper and lower  respiratory specimens during the acute phase of infection.  Positive  results are indicative of active infection with SARS-CoV-2.  Clinical  correlation  with patient history and other diagnostic information is  necessary to determine patient infection status.  Positive results do  not rule out bacterial infection or co-infection with other viruses. If result is PRESUMPTIVE POSTIVE SARS-CoV-2 nucleic acids MAY BE PRESENT.   A presumptive positive result was  obtained on the submitted specimen  and confirmed on repeat testing.  While 2019 novel coronavirus  (SARS-CoV-2) nucleic acids may be present in the submitted sample  additional confirmatory testing may be necessary for epidemiological  and / or clinical management purposes  to differentiate between  SARS-CoV-2 and other Sarbecovirus currently known to infect humans.  If clinically indicated additional testing with an alternate test  methodology [phone]) is advi sed. The SARS-CoV-2 RNA is generally  detectable in upper and lower respiratory specimens during the acute  phase of infection. The expected result is Negative. Fact Sheet for Patients:  StrictlyIdeas.no Fact Sheet for Healthcare Providers: BankingDealers.co.za This test is not yet approved or cleared by the [place] FDA and has been authorized for detection and/or diagnosis of SARS-CoV-2 by FDA under an Emergency Use Authorization (EUA).  This EUA will remain in effect (meaning this test can be used) for the duration of the COVID-19 declaration under Section 564(b)(1) of the Act, 21 U.S.C. section 360bbb-3(b)(1), unless the authorization is terminated or revoked sooner. Performed at [facility], [place] [address], [place], [place] [address]      Medications:   . heparin  5,000 Units Subcutaneous Q8H  . insulin aspart  0-9 Units Subcutaneous Q4H  . midodrine  2.5 mg Oral TID WC  . vitamin C  500 mg Oral BID  . zinc sulfate  220 mg Oral Daily   Continuous Infusions:    LOS: 0 days   Charlynne Cousins  [facility]  03/18/2019, 7:19 AM

## 2019-03-18 NOTE — ED Notes (Addendum)
Pt O2 sats at 89, upped 02 to 4L

## 2019-03-18 NOTE — ED Notes (Signed)
MD at bedside. 

## 2019-03-18 NOTE — ED Notes (Signed)
ED TO INPATIENT HANDOFF REPORT  ED Nurse Name and Phone #: [phone]  S Name/Age/Gender Brian Owen 68 y.o. male Room/Bed: 021C/021C  Code Status   Code Status: Full Code  Home/SNF/Other Home Patient oriented to: self, place, time and situation Is this baseline? Yes   Triage Complete: Triage complete  Chief Complaint septic  Triage Note Pt discharged from [facility] yesterday. Ems reports sick person, BP was initially 78/p. Now 148/90. Lethargic, generalized weakness, chills, fever 100.5*f, 28 resp.    Allergies No Known Allergies  Level of Care/Admitting Diagnosis ED Disposition    ED Disposition Condition [facility]: [place] [[id number]]  Level of Care: Progressive [102]  Covid Evaluation: Person Under Investigation (PUI)  Isolation Risk Level: High Risk/Airborne (Aerosolizing procedure, nebulizer, intubated/ventilation, CPAP/BiPAP)  Diagnosis: Multifocal pneumonia [1017510]  Admitting Physician: Shela Leff [[id number]]  Attending Physician: Shela Leff [[id number]]  Estimated length of stay: past midnight tomorrow  Certification:: I certify this patient will need inpatient services for at least 2 midnights  PT Class (Do Not Modify): Inpatient [101]  PT Acc Code (Do Not Modify): Private [1]       B Medical/Surgery History Past Medical History:  Diagnosis Date  . [place] 11/11/2015   legs/notes 11/11/2015  . Chronic kidney disease   . Heavy cigarette smoker   . Hypertension   . Parasite infection 2016   "couldn't afford to have surgery done; just left it; lower legs"  . Uncontrolled type II diabetes mellitus ([place]) dx'd 2013   Past Surgical History:  Procedure Laterality Date  . NO PAST SURGERIES       A IV Location/Drains/Wounds Patient Lines/Drains/Airways Status   Active Line/Drains/Airways    Name:   Placement date:   Placement time:   Site:   Days:   Peripheral IV 03/15/19 Left Forearm   03/15/19    1809     Forearm   3   Peripheral IV 03/03/2019 Right Forearm   03/03/2019    2127    Forearm   1   Peripheral IV 03/23/2019 Right Hand   03/12/2019    2127    Hand   1          Intake/Output Last 24 hours No intake or output data in the 24 hours ending 03/18/19 0059  Labs/Imaging Results for orders placed or performed during the hospital encounter of 03/11/2019 (from the past 48 hour(s))  Comprehensive metabolic panel     Status: Abnormal   Collection Time: 03/25/2019  9:39 PM  Result Value Ref Range   Sodium 135 135 - 145 mmol/L   Potassium 4.5 3.5 - 5.1 mmol/L   Chloride 102 98 - 111 mmol/L   CO2 18 (L) 22 - 32 mmol/L   Glucose, Bld 436 (H) 70 - 99 mg/dL   BUN 41 (H) 8 - 23 mg/dL   Creatinine, Ser 3.36 (H) 0.61 - 1.24 mg/dL   Calcium 8.6 (L) 8.9 - 10.3 mg/dL   Total Protein 6.8 6.5 - 8.1 g/dL   Albumin 2.6 (L) 3.5 - 5.0 g/dL   AST 50 (H) 15 - 41 U/L   ALT 19 0 - 44 U/L   Alkaline Phosphatase 59 38 - 126 U/L   Total Bilirubin 0.7 0.3 - 1.2 mg/dL   GFR calc non Af Amer 18 (L) >60 mL/min   GFR calc Af Amer 21 (L) >60 mL/min   Anion gap 15 5 - 15    Comment:  Performed at [facility], [place] [address], [place], [place] [address]  CBC WITH DIFFERENTIAL     Status: Abnormal   Collection Time: 03/25/2019  9:39 PM  Result Value Ref Range   WBC 7.4 4.0 - 10.5 K/uL   RBC 3.61 (L) 4.22 - 5.81 MIL/uL   Hemoglobin 10.5 (L) 13.0 - 17.0 g/dL   HCT 32.2 (L) 39.0 - 52.0 %   MCV 89.2 80.0 - 100.0 fL   MCH 29.1 26.0 - 34.0 pg   MCHC 32.6 30.0 - 36.0 g/dL   RDW 13.1 11.5 - 15.5 %   Platelets 127 (L) 150 - 400 K/uL   nRBC 0.0 0.0 - 0.2 %   Neutrophils Relative % 77 %   Neutro Abs 5.7 1.7 - 7.7 K/uL   Lymphocytes Relative 10 %   Lymphs Abs 0.7 0.7 - 4.0 K/uL   Monocytes Relative 2 %   Monocytes Absolute 0.2 0.1 - 1.0 K/uL   Eosinophils Relative 9 %   Eosinophils Absolute 0.7 (H) 0.0 - 0.5 K/uL   Basophils Relative 0 %   Basophils Absolute 0.0 0.0 - 0.1 K/uL   WBC Morphology INCREASED BANDS (>20%  BANDS)     Comment: MILD LEFT SHIFT (1-5% METAS, OCC MYELO, OCC BANDS)   Immature Granulocytes 2 %   Abs Immature Granulocytes 0.14 (H) 0.00 - 0.07 K/uL    Comment: Performed at [facility], [address], [place], [place] [address]  Lipase, blood     Status: None   Collection Time: 03/19/2019  9:39 PM  Result Value Ref Range   Lipase 24 11 - 51 U/L    Comment: Performed at [facility], [place] [address], [place], [place] [address]  Lactic acid, plasma     Status: Abnormal   Collection Time: 03/13/2019  9:55 PM  Result Value Ref Range   Lactic Acid, Venous 3.2 (HH) 0.5 - 1.9 mmol/L    Comment: CRITICAL RESULT CALLED TO, READ BACK BY AND VERIFIED WITH: Redford Behrle,P RN 03/16/2019 2226 JORDANS Performed at [facility], [place] [address], [place], [place] [address]   I-STAT 7, (LYTES, BLD GAS, ICA, H+H)     Status: Abnormal   Collection Time: 03/22/2019 10:35 PM  Result Value Ref Range   pH, Arterial 7.349 (L) 7.350 - 7.450   pCO2 arterial 32.1 32.0 - 48.0 mmHg   pO2, Arterial 60.0 (L) 83.0 - 108.0 mmHg   Bicarbonate 17.7 (L) 20.0 - 28.0 mmol/L   TCO2 19 (L) 22 - 32 mmol/L   O2 Saturation 90.0 %   Acid-base deficit 7.0 (H) 0.0 - 2.0 mmol/L   Sodium 135 135 - 145 mmol/L   Potassium 4.6 3.5 - 5.1 mmol/L   Calcium, Ion 1.20 1.15 - 1.40 mmol/L   HCT 28.0 (L) 39.0 - 52.0 %   Hemoglobin 9.5 (L) 13.0 - 17.0 g/dL   Patient temperature HIDE    Sample type ARTERIAL    Dg Chest Portable 1 View  Result Date: 03/13/2019 CLINICAL DATA:  68 year old male with fever, recently hospitalized. COVID-19 status pending today. EXAM: PORTABLE CHEST 1 VIEW COMPARISON:  12/11/2018 chest radiographs and earlier. FINDINGS: Portable AP semi upright view at 2230 hours. Confluent and indistinct left greater than right pulmonary patchy and interstitial opacity. No associated pleural effusion. Normal cardiac size and mediastinal contours. Visualized tracheal air column is within normal limits. Negative  visible bowel gas pattern. No acute osseous abnormality identified. IMPRESSION: Multifocal indistinct  and interstitial bilateral pulmonary opacity greater on the left highly suspicious for acute viral respiratory infection. No pleural effusion. Electronically Signed   By: Genevie Ann M.D.   On: 03/29/2019 23:03    Pending Labs Unresulted Labs (From admission, onward)    Start     Ordered   03/18/19 0500  Comprehensive metabolic panel  Tomorrow morning,   R     03/18/19 0046   03/18/19 0500  CBC  Tomorrow morning,   R     03/18/19 0054   03/18/19 3790  Basic metabolic panel  ONCE - STAT,   R     03/18/19 0050   03/18/19 0045  Ferritin  Once,   R     03/18/19 0046   03/18/19 0045  Fibrinogen  Once,   R     03/18/19 0046   03/18/19 0045  Lactate dehydrogenase  Once,   R     03/18/19 0046   03/18/19 0045  Procalcitonin  Once,   R     03/18/19 0046   03/18/19 0045  Troponin I - Once  Once,   R     03/18/19 0046   03/18/19 0044  ABO/Rh  Once,   R     03/18/19 0046   03/18/19 0044  C-reactive protein  Once,   R     03/18/19 0046   03/18/19 0044  D-dimer, quantitative (not at [facility])  Once,   R     03/18/19 0046   03/18/19 0042  Hemoglobin A1c  Once,   R    Comments:  To assess prior glycemic control    03/18/19 0046   03/18/19 0030  Lactic acid, plasma  ONCE - STAT,   R     03/18/19 0029   02/28/2019 2331  SARS Coronavirus 2 (CEPHEID- Performed in [place] hospital lab), Hosp Order  (Symptomatic Patients Labs with Precautions )  Once,   R    Question:  Patient immune status  Answer:  Normal   03/21/2019 2331   03/26/2019 2206  Urine culture  ONCE - STAT,   STAT     03/01/2019 2205   03/16/2019 2155  Lactic acid, plasma  Now then every 2 hours,   STAT     03/12/2019 2157   03/09/2019 2155  Blood Culture (routine x 2)  BLOOD CULTURE X 2,   STAT     03/22/2019 2157   02/28/2019 2155  Urinalysis, Routine w reflex microscopic  ONCE - STAT,   STAT     03/15/2019 2157          Vitals/Pain Today's  Vitals   03/18/19 0004 03/18/19 0005 03/18/19 0030 03/18/19 0045  BP:   (!) 119/59 (!) 100/58  Pulse:   99 95  Resp:   (!) 22 (!) 26  Temp:      TempSrc:      SpO2: 90% 91% 96% 96%  PainSc:        Isolation Precautions Airborne and Contact precautions  Medications Medications  acetaminophen (TYLENOL) tablet 650 mg (650 mg Oral Given 03/11/2019 2306)  midodrine (PROAMATINE) tablet 2.5 mg (has no administration in time range)  insulin aspart (novoLOG) injection 0-9 Units (has no administration in time range)  heparin injection 5,000 Units (has no administration in time range)  0.9 %  sodium chloride infusion (has no administration in time range)  vitamin C (ASCORBIC ACID) tablet 500 mg (has no administration in time range)  zinc sulfate capsule 220  mg (has no administration in time range)  lactated ringers bolus 1,000 mL (0 mLs Intravenous Stopped 03/18/19 0012)  cefTRIAXone (ROCEPHIN) 1 g in sodium chloride 0.9 % 100 mL IVPB (0 g Intravenous Stopped 03/10/2019 2346)  azithromycin (ZITHROMAX) 500 mg in sodium chloride 0.9 % 250 mL IVPB (500 mg Intravenous New Bag/Given 02/27/2019 2359)  sodium chloride 0.9 % bolus 1,000 mL (1,000 mLs Intravenous New Bag/Given 03/18/19 0026)    Mobility walks with device Low fall risk   Focused Assessments   R Recommendations: See Admitting Provider Note  Report given to:   Additional Notes:

## 2019-03-18 NOTE — Progress Notes (Signed)
Pt BP once again low at 75/43 with Map of 54 and pt lethargic and only responding to sternal rub. Placed pt back in trendelenberg and BP 80/47 Map 58. Paged Dr. Venetia Constable and made him aware. Pt able to respond and answer questions but still lethargic and sleepy. Will continue to monitor and await new orders.

## 2019-03-18 NOTE — Progress Notes (Signed)
Spoke with Dr. Venetia Constable regarding pt having 600 ml of urine in bladder per bladder scan. Per Dr. Venetia Constable, hold off on catheterizing and wait to see if pt will urinate on his own. Will continue to monitor.

## 2019-03-18 NOTE — Progress Notes (Signed)
In and out cath performed at bedside with Bella Kennedy RN assisting. Sterile technique maintained throughout. 575 ml of urine obtained and patient tolerated well.

## 2019-03-19 ENCOUNTER — Inpatient Hospital Stay (HOSPITAL_COMMUNITY): Payer: Medicare Other

## 2019-03-19 LAB — CULTURE, BLOOD (ROUTINE X 2)

## 2019-03-19 LAB — BASIC METABOLIC PANEL
Anion gap: 9 (ref 5–15)
BUN: 54 mg/dL — ABNORMAL HIGH (ref 8–23)
CO2: 18 mmol/L — ABNORMAL LOW (ref 22–32)
Calcium: 8.3 mg/dL — ABNORMAL LOW (ref 8.9–10.3)
Chloride: 114 mmol/L — ABNORMAL HIGH (ref 98–111)
Creatinine, Ser: 3.18 mg/dL — ABNORMAL HIGH (ref 0.61–1.24)
GFR calc Af Amer: 22 mL/min — ABNORMAL LOW (ref >60)
GFR calc non Af Amer: 19 mL/min — ABNORMAL LOW (ref >60)
Glucose, Bld: 163 mg/dL — ABNORMAL HIGH (ref 70–99)
Potassium: 4.8 mmol/L (ref 3.5–5.1)
Sodium: 141 mmol/L (ref 135–145)

## 2019-03-19 LAB — URINE CULTURE: Culture: <10000 — AB

## 2019-03-19 LAB — CBC WITH DIFFERENTIAL/PLATELET
Abs Immature Granulocytes: 0 10*3/uL (ref 0.00–0.07)
Band Neutrophils: 21 %
Basophils Absolute: 0 10*3/uL (ref 0.0–0.1)
Basophils Relative: 0 %
Eosinophils Absolute: 0 10*3/uL (ref 0.0–0.5)
Eosinophils Relative: 0 %
HCT: 33.5 % — ABNORMAL LOW (ref 39.0–52.0)
Hemoglobin: 10.8 g/dL — ABNORMAL LOW (ref 13.0–17.0)
Lymphocytes Relative: 6 %
Lymphs Abs: 0.5 10*3/uL — ABNORMAL LOW (ref 0.7–4.0)
MCH: 29.2 pg (ref 26.0–34.0)
MCHC: 32.2 g/dL (ref 30.0–36.0)
MCV: 90.5 fL (ref 80.0–100.0)
Monocytes Absolute: 0.2 10*3/uL (ref 0.1–1.0)
Monocytes Relative: 3 %
Neutro Abs: 6.8 10*3/uL (ref 1.7–7.7)
Neutrophils Relative %: 70 %
Platelets: 149 10*3/uL — ABNORMAL LOW (ref 150–400)
RBC: 3.7 MIL/uL — ABNORMAL LOW (ref 4.22–5.81)
RDW: 13.7 % (ref 11.5–15.5)
WBC Morphology: INCREASED
WBC: 7.5 10*3/uL (ref 4.0–10.5)
nRBC: 0 % (ref 0.0–0.2)

## 2019-03-19 LAB — STREP PNEUMONIAE URINARY ANTIGEN: Strep Pneumo Urinary Antigen: POSITIVE — AB

## 2019-03-19 LAB — FERRITIN: Ferritin: 387 ng/mL — ABNORMAL HIGH (ref 24–336)

## 2019-03-19 LAB — GLUCOSE, CAPILLARY
Glucose-Capillary: 126 mg/dL — ABNORMAL HIGH (ref 70–99)
Glucose-Capillary: 168 mg/dL — ABNORMAL HIGH (ref 70–99)
Glucose-Capillary: 204 mg/dL — ABNORMAL HIGH (ref 70–99)
Glucose-Capillary: 224 mg/dL — ABNORMAL HIGH (ref 70–99)
Glucose-Capillary: 276 mg/dL — ABNORMAL HIGH (ref 70–99)

## 2019-03-19 LAB — D-DIMER, QUANTITATIVE: D-Dimer, Quant: 3.12 ug/mL-FEU — ABNORMAL HIGH (ref 0.00–0.50)

## 2019-03-19 LAB — MRSA PCR SCREENING: MRSA by PCR: NEGATIVE

## 2019-03-19 LAB — C-REACTIVE PROTEIN: CRP: 40.3 mg/dL — ABNORMAL HIGH (ref <1.0)

## 2019-03-19 LAB — HIV ANTIBODY (ROUTINE TESTING W REFLEX): HIV Screen 4th Generation wRfx: NONREACTIVE

## 2019-03-19 MED ORDER — TOCILIZUMAB 400 MG/20ML IV SOLN
8.0000 mg/kg | Freq: Once | INTRAVENOUS | Status: AC
  Administered 2019-03-19: 466 mg via INTRAVENOUS
  Filled 2019-03-19: qty 23.3

## 2019-03-19 MED ORDER — METHYLPREDNISOLONE SODIUM SUCC 125 MG IJ SOLR
40.0000 mg | Freq: Three times a day (TID) | INTRAMUSCULAR | Status: DC
  Administered 2019-03-19 – 2019-03-21 (×7): 40 mg via INTRAVENOUS
  Filled 2019-03-19 (×6): qty 2

## 2019-03-19 MED ORDER — SODIUM CHLORIDE 0.9 % IV SOLN
2.0000 g | INTRAVENOUS | Status: DC
  Administered 2019-03-20 – 2019-03-23 (×4): 2 g via INTRAVENOUS
  Filled 2019-03-19 (×5): qty 2

## 2019-03-19 MED ORDER — TOCILIZUMAB 400 MG/20ML IV SOLN
8.0000 mg/kg | Freq: Once | INTRAVENOUS | Status: DC
  Filled 2019-03-19: qty 23.3

## 2019-03-19 NOTE — Progress Notes (Signed)
Spoke with daughter Georgiana Shore and updated on patients condition.

## 2019-03-19 NOTE — Progress Notes (Signed)
[facility] PROGRESS NOTE    Progress Note  Brian Owen  FVC:[id number] DOB: 1950-12-21 DOA: 03/15/2019 PCP: Alfonse Spruce, FNP     Brief Narrative:   Brian Owen is an 68 y.o. male past medical history significant for essential hypertension insulin-dependent diabetes, chronic kidney disease stage III with multiple admissions in the past for orthostatic hypotension thought to be autonomic dysfunction on midodrine who is noncompliant with it comes into the hospital for fever generalized weakness hypotension and tachypneic. Patient relates he has not been feeling well for about a week tired all the time, with cough shortness of breath vomiting and diarrhea.,  He was seen on 03/15/2023 orthostatic hypotension was given fluids started Middaugh drain and discharged home. In the ED: He was found to be febrile with no leukocytosis hypotensive lactic acid of 3.2 hyperglycemia mild anion gap acidosis, with a creatinine of 3.3 (with a baseline 2.0) ABG showed 7.34/30 2/60 with a chest x-ray that showed multifocal interstitial opacities.  Blood cultures were drawn he was given a liter of normal saline started empirically on Rocephin and azithromycin  Medications: 03/18/2021 03/22/2019 IV Solu-Medrol 03/19/2019 Actemra 03/18/2019 IV vancomycin and cefepime  Procedures: 02/28/2019 chest x-ray show opacities. 03/19/2019 chest x-ray Assessment/Plan:   Acute respiratory failure with hypoxia due to  Pneumonia due to COVID-19 virus Sirs: Has multiple bowel risk factors for decompensation, ARDS and multiorgan failure. Home dose of midodrine has been increased. Continue vitamin C and zinc. Cultures and urine cultures are negative till date His procalcitonin was 88.7, he was started empirically on IV vancomycin and cefepime and on 03/18/2019, since then he has been afebrile Blood cultures have remained negative. His oxygen requirement is increasing, repeat a chest x-ray 03/19/2019. Inflammatory  markers: COVID-19 Labs  Recent Labs    03/18/19 0118 03/19/19 0516 03/19/19 0540  DDIMER 1.93* 3.12*  --   FERRITIN 312  --  387*  LDH 232*  --   --   CRP 30.8*  --  40.3*    Lab Results  Component Value Date   SARSCOV2NAA POSITIVE (A) 03/21/2019   Diabetic autonomic dysfunction/orthostatic hypotension: His midodrine was increased as his blood pressure was borderline low and was contributing to his encephalopathy.  Acute metabolic encephalopathy due to hypotension: Resolved doing much better likely due to autonomic dysfunction.  Type 2 diabetes mellitus with hyperglycemia, with long-term current use of insulin (HCC) Hemoglobin A1c was 15, he is currently on IV steroids. On admission he was started on sliding scale insulin and his blood glucose is significantly improved.  AKI (acute kidney injury) (Brian Owen): Unclear baseline probably ranging from 1.5-2.0.  Likely multifactorial in the setting of orthostatic hypotension and infectious etiology. Today slightly improved to 3.1.  Will recheck tomorrow morning, hold IV fluids for now. As his oxygen requirement is increasing. We will repeat a chest x-ray today.  He appears to be euvolemic on physical exam.  Elevated cardiac biomarkers: Marginally elevated, after his mentation improved he denies any chest pain.   DVT prophylaxis: lovenox Family Communication:daughter and wife Disposition Plan/Barrier to D/C: Once his respiratory status is improved. Code Status:     Code Status Orders  (From admission, onward)         Start     Ordered   03/18/19 0044  Full code  Continuous     03/18/19 0046        Code Status History    Date Active Date Inactive Code Status Order ID Comments User Context  12/04/2018 1741 12/05/2018 2034 Full Code [id number]  Alphonzo Grieve, MD Inpatient   11/11/2018 1620 11/13/2018 2038 Full Code [id number]  Ina Homes, MD ED   01/11/2018 0131 01/12/2018 2052 Full Code [id number]  Ivor Costa, MD ED    01/13/2017 2200 01/16/2017 2202 Full Code [id number]  Sela Hilding, MD Inpatient   08/27/2016 1559 08/29/2016 1705 Full Code [id number]  Jule Ser, DO Inpatient   06/16/2016 0259 06/20/2016 1807 Full Code [id number]  Toy Baker, MD Inpatient   11/27/2015 1052 11/29/2015 1614 Full Code [id number]  Willia Craze, NP Inpatient   11/11/2015 1908 11/13/2015 1855 Full Code [id number]  Elgergawy, Silver Huguenin, MD Inpatient   07/22/2015 1924 07/24/2015 1355 Full Code [id number]  Etta Quill, DO ED   09/10/2014 1210 09/12/2014 1901 Full Code [id number]  Velvet Bathe, MD Inpatient   07/22/2014 1642 07/29/2014 0020 Full Code [id number]  Hosie Poisson, MD Inpatient   05/15/2014 1453 05/16/2014 1241 Full Code [id number]  Azzie Roup, MD Inpatient   05/13/2014 2153 05/15/2014 1453 Full Code [id number]  Orson Eva, MD Inpatient        IV Access:    Peripheral IV   Procedures and diagnostic studies:   Dg Chest Portable 1 View  Result Date: 03/02/2019 CLINICAL DATA:  68 year old male with fever, recently hospitalized. COVID-19 status pending today. EXAM: PORTABLE CHEST 1 VIEW COMPARISON:  12/11/2018 chest radiographs and earlier. FINDINGS: Portable AP semi upright view at 2230 hours. Confluent and indistinct left greater than right pulmonary patchy and interstitial opacity. No associated pleural effusion. Normal cardiac size and mediastinal contours. Visualized tracheal air column is within normal limits. Negative visible bowel gas pattern. No acute osseous abnormality identified. IMPRESSION: Multifocal indistinct and interstitial bilateral pulmonary opacity greater on the left highly suspicious for acute viral respiratory infection. No pleural effusion. Electronically Signed   By: Genevie Ann M.D.   On: 03/23/2019 23:03     Medical Consultants:    None.  Anti-Infectives:   IV vancomycin and cefepime.  Subjective:    Brian Owen he is more awake today, he denies any chest pain or shortness  of breath he relates he is hungry.  Objective:    Vitals:   03/18/19 2113 03/19/19 0017 03/19/19 0540 03/19/19 0808  BP: 128/72 106/65  116/66  Pulse:    (!) 105  Resp:    (!) 22  Temp: 99.2 F (37.3 C) 98.6 F (37 C) 98.7 F (37.1 C)   TempSrc: Oral Oral Oral   SpO2:      Weight:      Height:        Intake/Output Summary (Last 24 hours) at 03/19/2019 0810 Last data filed at 03/19/2019 0346 Gross per 24 hour  Intake 2317.92 ml  Output 500 ml  Net 1817.92 ml   Filed Weights   03/18/19 0423  Weight: 58.3 kg    Exam: General exam: In no acute distress. Respiratory system: Good air movement and diffuse crackles bilaterally.. Cardiovascular system: S1 & S2 heard, RRR. Gastrointestinal system: Abdomen is nondistended, soft and nontender.  Central nervous system: Alert and oriented. No focal neurological deficits. Extremities: No pedal edema. Skin: No rashes, lesions or ulcer.    Data Reviewed:    Labs: Basic Metabolic Panel: Recent Labs  Lab 03/13/19 1642 03/15/19 1817 03/26/2019 2139 03/13/2019 2235 03/18/19 0118 03/18/19 0953 03/19/19 0516  NA 131* 130* 135 135 134* 141 141  K 5.1 4.7 4.5 4.6 4.4 4.1 4.8  CL 98 97* 102  --  105  --  114*  CO2 24 22 18*  --  16*  --  18*  GLUCOSE 286* 449* 436*  --  414*  --  163*  BUN 22 23 41*  --  42*  --  54*  CREATININE 2.18* 2.06* 3.36*  --  3.00*  --  3.18*  CALCIUM 9.1 8.2* 8.6*  --  7.9*  --  8.3*   GFR Estimated Creatinine Clearance: 16.4 mL/min (A) (by C-G formula based on SCr of 3.18 mg/dL (H)). Liver Function Tests: Recent Labs  Lab 03/13/19 1642 03/11/2019 2139  AST 17 50*  ALT 15 19  ALKPHOS 90 59  BILITOT 0.6 0.7  PROT 7.0 6.8  ALBUMIN 3.3* 2.6*   Recent Labs  Lab 03/03/2019 2139  LIPASE 24   No results for input(s): AMMONIA in the last 168 hours. Coagulation profile Recent Labs  Lab 03/13/19 2018  INR 1.0   COVID-19 Labs  Recent Labs    03/18/19 0118 03/19/19 0516 03/19/19 0540   DDIMER 1.93* 3.12*  --   FERRITIN 312  --  387*  LDH 232*  --   --   CRP 30.8*  --  40.3*    Lab Results  Component Value Date   SARSCOV2NAA POSITIVE (A) 03/06/2019    CBC: Recent Labs  Lab 03/13/19 1642 03/15/19 1817 03/24/2019 2139 03/15/2019 2235 03/18/19 0953 03/19/19 0516  WBC 6.4 7.3 7.4  --   --  7.5  NEUTROABS 4.2  --  5.7  --   --  6.8  HGB 10.2* 11.1* 10.5* 9.5* 8.5* 10.8*  HCT 32.4* 33.9* 32.2* 28.0* 25.0* 33.5*  MCV 90.0 90.6 89.2  --   --  90.5  PLT 151 122* 127*  --   --  149*   Cardiac Enzymes: Recent Labs  Lab 03/18/19 0118  TROPONINI 0.04*   BNP (last 3 results) No results for input(s): PROBNP in the last 8760 hours. CBG: Recent Labs  Lab 03/18/19 1134 03/18/19 1611 03/18/19 2101 03/18/19 2334 03/19/19 0352  GLUCAP 135* 137* 181* 161* 126*   D-Dimer: Recent Labs    03/18/19 0118 03/19/19 0516  DDIMER 1.93* 3.12*   Hgb A1c: Recent Labs    03/18/19 0118 03/18/19 1045  HGBA1C 14.9* 14.9*   Lipid Profile: No results for input(s): CHOL, HDL, LDLCALC, TRIG, CHOLHDL, LDLDIRECT in the last 72 hours. Thyroid function studies: No results for input(s): TSH, T4TOTAL, T3FREE, THYROIDAB in the last 72 hours.  Invalid input(s): FREET3 Anemia work up: Recent Labs    03/18/19 0118 03/19/19 0540  FERRITIN 312 387*   Sepsis Labs: Recent Labs  Lab 03/13/19 1642 03/15/19 1817 03/06/2019 2139 03/10/2019 2155 03/22/2019 2355 03/18/19 0118 03/18/19 1600 03/19/19 0516  PROCALCITON  --   --   --   --   --  88.27  --   --   WBC 6.4 7.3 7.4  --   --   --   --  7.5  LATICACIDVEN  --   --   --  3.2* 1.5  --  2.1*  --    Microbiology Recent Results (from the past 240 hour(s))  Blood Culture (routine x 2)     Status: None (Preliminary result)   Collection Time: 03/19/2019  8:24 PM  Result Value Ref Range Status   Specimen Description BLOOD LEFT FOREARM  Final   Special Requests   Final    BOTTLES DRAWN AEROBIC ONLY Blood Culture adequate volume    Culture  Final    NO GROWTH < 24 HOURS Performed at [facility], [place] [address], [place], [place] [address]    Report Status PENDING  Incomplete  Blood Culture (routine x 2)     Status: None (Preliminary result)   Collection Time: 03/11/2019 10:03 PM  Result Value Ref Range Status   Specimen Description BLOOD RIGHT IV  Final   Special Requests   Final    BOTTLES DRAWN AEROBIC AND ANAEROBIC Blood Culture results may not be optimal due to an excessive volume of blood received in culture bottles   Culture  Setup Time   Final    GRAM POSITIVE COCCI ANAEROBIC BOTTLE ONLY Organism ID to follow CRITICAL RESULT CALLED TO, READ BACK BY AND VERIFIED WITH: Jene Every PharmD 16:05 03/18/19 ([email or web address]) AEROBIC BOTTLE ONLY GRAM POSITIVE RODS CRITICAL RESULT CALLED TO, READ BACK BY AND VERIFIED WITHCristopher Estimable [facility] 03/18/19 2128 JDW Performed at [facility], [place] [address] [place], [place] [address]    Culture GRAM POSITIVE COCCI  Final   Report Status PENDING  Incomplete  Blood Culture ID Panel (Reflexed)     Status: Abnormal   Collection Time: 02/27/2019 10:03 PM  Result Value Ref Range Status   Enterococcus species NOT DETECTED NOT DETECTED Final   Listeria monocytogenes NOT DETECTED NOT DETECTED Final   Staphylococcus species DETECTED (A) NOT DETECTED Final    Comment: Methicillin (oxacillin) resistant coagulase negative staphylococcus. Possible blood culture contaminant (unless isolated from more than one blood culture draw or clinical case suggests pathogenicity). No antibiotic treatment is indicated for blood  culture contaminants. CRITICAL RESULT CALLED TO, READ BACK BY AND VERIFIED WITH: Jene Every PharmD 16:05 03/18/19 ([email or web address])    Staphylococcus aureus (BCID) NOT DETECTED NOT DETECTED Final   Methicillin resistance DETECTED (A) NOT DETECTED Final    Comment: CRITICAL RESULT CALLED TO, READ BACK BY AND VERIFIED WITH: Jene Every PharmD 16:05 03/18/19 ([email or web address])    Streptococcus  species NOT DETECTED NOT DETECTED Final   Streptococcus agalactiae NOT DETECTED NOT DETECTED Final   Streptococcus pneumoniae NOT DETECTED NOT DETECTED Final   Streptococcus pyogenes NOT DETECTED NOT DETECTED Final   Acinetobacter baumannii NOT DETECTED NOT DETECTED Final   Enterobacteriaceae species NOT DETECTED NOT DETECTED Final   Enterobacter cloacae complex NOT DETECTED NOT DETECTED Final   Escherichia coli NOT DETECTED NOT DETECTED Final   Klebsiella oxytoca NOT DETECTED NOT DETECTED Final   Klebsiella pneumoniae NOT DETECTED NOT DETECTED Final   Proteus species NOT DETECTED NOT DETECTED Final   Serratia marcescens NOT DETECTED NOT DETECTED Final   Haemophilus influenzae NOT DETECTED NOT DETECTED Final   Neisseria meningitidis NOT DETECTED NOT DETECTED Final   Pseudomonas aeruginosa NOT DETECTED NOT DETECTED Final   Candida albicans NOT DETECTED NOT DETECTED Final   Candida glabrata NOT DETECTED NOT DETECTED Final   Candida krusei NOT DETECTED NOT DETECTED Final   Candida parapsilosis NOT DETECTED NOT DETECTED Final   Candida tropicalis NOT DETECTED NOT DETECTED Final    Comment: Performed at [facility], [place]. [address] [place], [place] [address]  SARS Coronavirus 2 (CEPHEID- Performed in [facility] hospital lab), Hosp Order     Status: Abnormal   Collection Time: 03/01/2019 11:52 PM  Result Value Ref Range Status   SARS Coronavirus 2 POSITIVE (A) NEGATIVE Final    Comment: RESULT CALLED TO, READ BACK BY AND VERIFIED WITH: P JOHNSTON RN 03/17/18 0131 JDW (NOTE) If result is NEGATIVE SARS-CoV-2  target nucleic acids are NOT DETECTED. The SARS-CoV-2 RNA is generally detectable in upper and lower  respiratory specimens during the acute phase of infection. The lowest  concentration of SARS-CoV-2 viral copies this assay can detect is 250  copies / mL. A negative result does not preclude SARS-CoV-2 infection  and should not be used as the sole basis for treatment or other   patient management decisions.  A negative result may occur with  improper specimen collection / handling, submission of specimen other  than nasopharyngeal swab, presence of viral mutation(s) within the  areas targeted by this assay, and inadequate number of viral copies  (<250 copies / mL). A negative result must be combined with clinical  observations, patient history, and epidemiological information. If result is POSITIVE SARS-CoV-2 target nucleic acids are DETECTED. The SA RS-CoV-2 RNA is generally detectable in upper and lower  respiratory specimens during the acute phase of infection.  Positive  results are indicative of active infection with SARS-CoV-2.  Clinical  correlation with patient history and other diagnostic information is  necessary to determine patient infection status.  Positive results do  not rule out bacterial infection or co-infection with other viruses. If result is PRESUMPTIVE POSTIVE SARS-CoV-2 nucleic acids MAY BE PRESENT.   A presumptive positive result was obtained on the submitted specimen  and confirmed on repeat testing.  While 2019 novel coronavirus  (SARS-CoV-2) nucleic acids may be present in the submitted sample  additional confirmatory testing may be necessary for epidemiological  and / or clinical management purposes  to differentiate between  SARS-CoV-2 and other Sarbecovirus currently known to infect humans.  If clinically indicated additional testing with an alternate test  methodology [phone]) is advi sed. The SARS-CoV-2 RNA is generally  detectable in upper and lower respiratory specimens during the acute  phase of infection. The expected result is Negative. Fact Sheet for Patients:  StrictlyIdeas.no Fact Sheet for Healthcare Providers: BankingDealers.co.za This test is not yet approved or cleared by the [place] FDA and has been authorized for detection and/or diagnosis of SARS-CoV-2 by  FDA under an Emergency Use Authorization (EUA).  This EUA will remain in effect (meaning this test can be used) for the duration of the COVID-19 declaration under Section 564(b)(1) of the Act, 21 U.S.C. section 360bbb-3(b)(1), unless the authorization is terminated or revoked sooner. Performed at [facility], [place] [address], [place], [place] [address]   Culture, blood (routine x 2) Call MD if unable to obtain prior to antibiotics being given     Status: None (Preliminary result)   Collection Time: 03/18/19  4:00 PM  Result Value Ref Range Status   Specimen Description   Final    BLOOD RIGHT ARM Performed at [place] [address], [place], [place] [address]    Special Requests   Final    BOTTLES DRAWN AEROBIC ONLY Blood Culture results may not be optimal due to an inadequate volume of blood received in culture bottles Performed at [place] [address], [place], [place] [address]    Culture PENDING  Incomplete   Report Status PENDING  Incomplete  Culture, blood (routine x 2) Call MD if unable to obtain prior to antibiotics being given     Status: None (Preliminary result)   Collection Time: 03/18/19  4:00 PM  Result Value Ref Range Status   Specimen Description   Final    BLOOD RIGHT HAND Performed at [facility], [address] [place], [place] [address]    Special Requests   Final    BOTTLES DRAWN AEROBIC ONLY Blood Culture results may not be optimal due to an inadequate volume of blood received in culture bottles Performed at [place] [address] [place], [place] [address]    Culture PENDING  Incomplete   Report Status PENDING  Incomplete     Medications:   . heparin  5,000 Units Subcutaneous Q8H  . insulin aspart  0-9 Units Subcutaneous Q4H  . midodrine  5 mg Oral TID WC  . vitamin C  500 mg Oral BID  . zinc sulfate  220 mg Oral Daily   Continuous Infusions: . ceFEPime (MAXIPIME) IV 1 g  (03/18/19 2252)  . [START ON 03/20/2019] vancomycin        LOS: 1 day   Charlynne Cousins  [facility]  03/19/2019, 8:10 AM

## 2019-03-19 NOTE — Progress Notes (Signed)
No urine output from patient, on call MD paged. Verbal in and out cath. Bladder scanner  >245ml.

## 2019-03-19 NOTE — Plan of Care (Signed)
Patient stable, discussed POC updated daughter Georgiana Shore at bedside via telephone, agreeable with plan, denies question/concerns at this time.

## 2019-03-19 NOTE — Progress Notes (Signed)
Pharmacy Antibiotic Note  Brian Owen is a 68 y.o. male admitted on 03/21/2019 with pneumonia.  Pharmacy has been consulted for Vancomycin, Cefepime dosing.  His CrCl~16 ml/min so we will change his cefepime to q24.   Plan:  Change cefepime 2g IV q24  Vancomycin 1250 mg IV x1, then 750 mg IV q48h  Goal AUC 400-550.  Expected AUC: 470 using SCr 3 MRSA PCR  Height: 5\' 1"  (154.9 cm) Weight: 128 lb 8.5 oz (58.3 kg) IBW/kg (Calculated) : 52.3  Temp (24hrs), Avg:98.9 F (37.2 C), Min:97.9 F (36.6 C), Max:99.7 F (37.6 C)  Recent Labs  Lab 03/13/19 1642 03/15/19 1817 03/11/2019 2139 03/10/2019 2155 03/05/2019 2355 03/18/19 0118 03/18/19 1600 03/19/19 0516  WBC 6.4 7.3 7.4  --   --   --   --  7.5  CREATININE 2.18* 2.06* 3.36*  --   --  3.00*  --  3.18*  LATICACIDVEN  --   --   --  3.2* 1.5  --  2.1*  --     Estimated Creatinine Clearance: 16.4 mL/min (A) (by C-G formula based on SCr of 3.18 mg/dL (H)).    No Known Allergies  Antimicrobials this admission: 5/19 Azithromycin x1 5/19 Ceftriaxone x1 5/20 Vancomycin >>  5/20 Cefepime >>   Dose adjustments this admission:   Microbiology results: 5/19 SARS Coronavirus 2: positive 5/19 BCx: 1/4 CNS, Diptheroids  5/20 blood>> 5/20 UCx:<10k 5/20 Tracheal aspirate 5/20 MRSA PCR:   Onnie Boer, PharmD, BCIDP, AAHIVP, CPP Infectious Disease Pharmacist 03/19/2019 2:11 PM

## 2019-03-20 LAB — CBC WITH DIFFERENTIAL/PLATELET
Abs Immature Granulocytes: 0 10*3/uL (ref 0.00–0.07)
Basophils Absolute: 0 10*3/uL (ref 0.0–0.1)
Basophils Relative: 0 %
Eosinophils Absolute: 0 10*3/uL (ref 0.0–0.5)
Eosinophils Relative: 0 %
HCT: 34.4 % — ABNORMAL LOW (ref 39.0–52.0)
Hemoglobin: 11.2 g/dL — ABNORMAL LOW (ref 13.0–17.0)
Lymphocytes Relative: 5 %
Lymphs Abs: 0.2 10*3/uL — ABNORMAL LOW (ref 0.7–4.0)
MCH: 29.2 pg (ref 26.0–34.0)
MCHC: 32.6 g/dL (ref 30.0–36.0)
MCV: 89.6 fL (ref 80.0–100.0)
Monocytes Absolute: 0.1 10*3/uL (ref 0.1–1.0)
Monocytes Relative: 3 %
Neutro Abs: 4.2 10*3/uL (ref 1.7–7.7)
Neutrophils Relative %: 92 %
Platelets: 141 10*3/uL — ABNORMAL LOW (ref 150–400)
RBC: 3.84 MIL/uL — ABNORMAL LOW (ref 4.22–5.81)
RDW: 13.9 % (ref 11.5–15.5)
WBC: 4.6 10*3/uL (ref 4.0–10.5)
nRBC: 0 % (ref 0.0–0.2)

## 2019-03-20 LAB — C-REACTIVE PROTEIN: CRP: 27.9 mg/dL — ABNORMAL HIGH (ref <1.0)

## 2019-03-20 LAB — GLUCOSE, CAPILLARY
Glucose-Capillary: 243 mg/dL — ABNORMAL HIGH (ref 70–99)
Glucose-Capillary: 245 mg/dL — ABNORMAL HIGH (ref 70–99)
Glucose-Capillary: 274 mg/dL — ABNORMAL HIGH (ref 70–99)
Glucose-Capillary: 316 mg/dL — ABNORMAL HIGH (ref 70–99)
Glucose-Capillary: 348 mg/dL — ABNORMAL HIGH (ref 70–99)

## 2019-03-20 LAB — BASIC METABOLIC PANEL
Anion gap: 9 (ref 5–15)
BUN: 59 mg/dL — ABNORMAL HIGH (ref 8–23)
CO2: 18 mmol/L — ABNORMAL LOW (ref 22–32)
Calcium: 8.8 mg/dL — ABNORMAL LOW (ref 8.9–10.3)
Chloride: 111 mmol/L (ref 98–111)
Creatinine, Ser: 2.61 mg/dL — ABNORMAL HIGH (ref 0.61–1.24)
GFR calc Af Amer: 28 mL/min — ABNORMAL LOW (ref >60)
GFR calc non Af Amer: 24 mL/min — ABNORMAL LOW (ref >60)
Glucose, Bld: 296 mg/dL — ABNORMAL HIGH (ref 70–99)
Potassium: 4.5 mmol/L (ref 3.5–5.1)
Sodium: 138 mmol/L (ref 135–145)

## 2019-03-20 LAB — FERRITIN: Ferritin: 479 ng/mL — ABNORMAL HIGH (ref 24–336)

## 2019-03-20 LAB — HEPATITIS PANEL, ACUTE
HCV Ab: <0.1 s/co ratio (ref 0.0–0.9)
Hep A IgM: NEGATIVE
Hep B C IgM: NEGATIVE
Hepatitis B Surface Ag: NEGATIVE

## 2019-03-20 LAB — D-DIMER, QUANTITATIVE: D-Dimer, Quant: 2.17 ug/mL-FEU — ABNORMAL HIGH (ref 0.00–0.50)

## 2019-03-20 MED ORDER — INSULIN DETEMIR 100 UNIT/ML ~~LOC~~ SOLN
4.0000 [IU] | Freq: Two times a day (BID) | SUBCUTANEOUS | Status: DC
  Administered 2019-03-20 – 2019-03-21 (×2): 4 [IU] via SUBCUTANEOUS
  Filled 2019-03-20 (×4): qty 0.04

## 2019-03-20 MED ORDER — INSULIN ASPART 100 UNIT/ML ~~LOC~~ SOLN
0.0000 [IU] | Freq: Three times a day (TID) | SUBCUTANEOUS | Status: DC
  Administered 2019-03-20: 9 [IU] via SUBCUTANEOUS
  Administered 2019-03-21: 09:00:00 7 [IU] via SUBCUTANEOUS
  Administered 2019-03-21: 5 [IU] via SUBCUTANEOUS

## 2019-03-20 MED ORDER — INSULIN ASPART 100 UNIT/ML ~~LOC~~ SOLN
3.0000 [IU] | Freq: Three times a day (TID) | SUBCUTANEOUS | Status: DC
  Administered 2019-03-20 – 2019-03-21 (×3): 3 [IU] via SUBCUTANEOUS

## 2019-03-20 MED ORDER — INSULIN ASPART 100 UNIT/ML ~~LOC~~ SOLN
0.0000 [IU] | Freq: Every day | SUBCUTANEOUS | Status: DC
  Administered 2019-03-20: 4 [IU] via SUBCUTANEOUS

## 2019-03-20 NOTE — Progress Notes (Addendum)
Patient sats 85-90% on 100% NRB 15L. He is awake and oriented, had him cough several times and deep breathe. He was able to follow these commands, sats remaining as above. RR 18 and HR 77 at this time. MD Thereasa Solo made aware, will continue to monitor closely.   MD Mcclung returned page, and new orders received to prone patient at this time. Patient assisted onto his stomach and explained rationale.

## 2019-03-20 NOTE — Progress Notes (Addendum)
[facility] TEAM 1 - Stepdown/ICU TEAM  Brian Owen  YYT:[id number] DOB: August 30, 1951 DOA: 03/10/2019 PCP: Alfonse Spruce, FNP    Brief Narrative:  27NT w/ a hx of HTN, DM, CKD Stage 3, and multiple prior admissions for orthostatic hypotension related to autonomic dysfunction (noncompliant w/ midodrine) who presented w/ fever generalized weakness hypotension and tachypnea after not feeling well in general for a week. He was seen on 5/17 for orthostatic hypotension and was given fluids started and midodrine and discharged home.  In the ED he was found to be hyperglycemic and febrile with a creatinine of 3.3 (baseline 2.0) and a CXR noting multifocal interstitial opacities.    Significant Events: 5/19 admit   COVID-19 specific Treatment: Actemra 5/21 Steroids 5/21 >   Subjective: Requiring high level O2 support at the time of my visit but appears comfortable.  Complains of a mild headache.  Denies chest pain nausea vomiting or abdominal pain.  Assessment & Plan:  Acute hypoxic respiratory failure -COVID-19 pneumonia -sepsis Wean oxygen as able - prone when needed - follow-up chest x-ray in a.m.  Recent Labs    03/18/19 0118 03/19/19 0516 03/19/19 0540 03/20/19 0525  DDIMER 1.93* 3.12*  --  2.17*  FERRITIN 312  --  387* 479*  LDH 232*  --   --   --   CRP 30.8*  --  40.3* 27.9*    Autonomic dysfunction /chronic orthostatic hypotension Blood pressure stable at this time -continue midodrine  Metabolic encephalopathy -acute Appears much improved with stabilization of hypoxia and stable blood pressure -follow clinically  DM2 -uncontrolled with hyperglycemia CBG not yet at goal - adjust treatment and follow  Acute kidney injury on CKD Baseline crt 2.0 -kidney function steadily improving -follow trend  Mildly elevated troponin Denies chest pain -likely related to renal dysfunction and hypotension -follow clinically  Positive blood cultures Clinically most consistent with  contamination -antibiotics not indicated for this issue at present  DVT prophylaxis: Lovenox Code Status: FULL CODE Family Communication:  Disposition Plan: Progressive Care   Consultants:  none  Antimicrobials:  Azithromycin 5/19 Ceftriaxone 5/19 Cefepime 5/20 > Vancomycin 5/20 > 5/22  Objective: Blood pressure (!) 149/82, pulse 87, temperature 98.3 F (36.8 C), temperature source Oral, resp. rate 17, height 5\' 1"  (1.549 m), weight 58.3 kg, SpO2 94 %.  Intake/Output Summary (Last 24 hours) at 03/20/2019 1135 Last data filed at 03/20/2019 1107 Gross per 24 hour  Intake 1578.06 ml  Output 1100 ml  Net 478.06 ml   Filed Weights   03/18/19 0423  Weight: 58.3 kg    Examination: General: No acute respiratory distress Lungs: Poor air movement bilateral bases with no wheezing Cardiovascular: Regular rate and rhythm without murmur gallop or rub normal S1 and S2 Abdomen: Nontender, nondistended, soft, bowel sounds positive, no rebound, no ascites, no appreciable mass Extremities: No significant cyanosis, clubbing, or edema bilateral lower extremities  CBC: Recent Labs  Lab 02/28/2019 2139  03/18/19 0953 03/19/19 0516 03/20/19 0525  WBC 7.4  --   --  7.5 4.6  NEUTROABS 5.7  --   --  6.8 4.2  HGB 10.5*   < > 8.5* 10.8* 11.2*  HCT 32.2*   < > 25.0* 33.5* 34.4*  MCV 89.2  --   --  90.5 89.6  PLT 127*  --   --  149* 141*   < > = values in this interval not displayed.   Basic Metabolic Panel: Recent Labs  Lab 03/18/19 0118 03/18/19  9449 03/19/19 0516 03/20/19 0525  NA 134* 141 141 138  K 4.4 4.1 4.8 4.5  CL 105  --  114* 111  CO2 16*  --  18* 18*  GLUCOSE 414*  --  163* 296*  BUN 42*  --  54* 59*  CREATININE 3.00*  --  3.18* 2.61*  CALCIUM 7.9*  --  8.3* 8.8*   GFR: Estimated Creatinine Clearance: 20 mL/min (A) (by C-G formula based on SCr of 2.61 mg/dL (H)).  Liver Function Tests: Recent Labs  Lab 03/13/19 1642 03/05/2019 2139  AST 17 50*  ALT 15 19   ALKPHOS 90 59  BILITOT 0.6 0.7  PROT 7.0 6.8  ALBUMIN 3.3* 2.6*   Recent Labs  Lab 03/02/2019 2139  LIPASE 24    Coagulation Profile: Recent Labs  Lab 03/13/19 2018  INR 1.0    Cardiac Enzymes: Recent Labs  Lab 03/18/19 0118  TROPONINI 0.04*    HbA1C: Hgb A1c MFr Bld  Date/Time Value Ref Range Status  03/18/2019 10:45 AM 14.9 (H) 4.8 - 5.6 % Final    Comment:    (NOTE) Pre diabetes:          5.7%-6.4% Diabetes:              >6.4% Glycemic control for   <7.0% adults with diabetes   03/18/2019 01:18 AM 14.9 (H) 4.8 - 5.6 % Final    Comment:    (NOTE) Pre diabetes:          5.7%-6.4% Diabetes:              >6.4% Glycemic control for   <7.0% adults with diabetes     CBG: Recent Labs  Lab 03/19/19 2112 03/20/19 0021 03/20/19 0420 03/20/19 0818 03/20/19 1105  GLUCAP 276* 274* 245* 243* 348*    Recent Results (from the past 240 hour(s))  Blood Culture (routine x 2)     Status: None (Preliminary result)   Collection Time: 03/10/2019  8:24 PM  Result Value Ref Range Status   Specimen Description BLOOD LEFT FOREARM  Final   Special Requests   Final    BOTTLES DRAWN AEROBIC ONLY Blood Culture adequate volume   Culture   Final    NO GROWTH 3 DAYS Performed at [facility], [place] [address] [place], [place] [address]    Report Status PENDING  Incomplete  Blood Culture (routine x 2)     Status: Abnormal   Collection Time: 03/20/2019 10:03 PM  Result Value Ref Range Status   Specimen Description BLOOD RIGHT IV  Final   Special Requests   Final    BOTTLES DRAWN AEROBIC AND ANAEROBIC Blood Culture results may not be optimal due to an excessive volume of blood received in culture bottles   Culture  Setup Time   Final    GRAM POSITIVE COCCI ANAEROBIC BOTTLE ONLY Organism ID to follow CRITICAL RESULT CALLED TO, READ BACK BY AND VERIFIED WITH: Jene Every PharmD 16:05 03/18/19 ([email or web address]) AEROBIC BOTTLE ONLY GRAM POSITIVE RODS CRITICAL RESULT CALLED TO, READ BACK  BY AND VERIFIED WITH: E WILLIAMSON PHARMD 03/18/19 2128 JDW    Culture (A)  Final    STAPHYLOCOCCUS SPECIES (COAGULASE NEGATIVE) DIPHTHEROIDS(CORYNEBACTERIUM SPECIES) THE SIGNIFICANCE OF ISOLATING THIS ORGANISM FROM A SINGLE SET OF BLOOD CULTURES WHEN MULTIPLE SETS ARE DRAWN IS UNCERTAIN. PLEASE NOTIFY THE MICROBIOLOGY DEPARTMENT WITHIN ONE WEEK IF SPECIATION AND SENSITIVITIES ARE REQUIRED. Performed at [facility], [place] [address], [place], [place]  [address]    Report Status 03/19/2019 FINAL  Final  Blood Culture ID Panel (Reflexed)     Status: Abnormal   Collection Time: 03/15/2019 10:03 PM  Result Value Ref Range Status   Enterococcus species NOT DETECTED NOT DETECTED Final   Listeria monocytogenes NOT DETECTED NOT DETECTED Final   Staphylococcus species DETECTED (A) NOT DETECTED Final    Comment: Methicillin (oxacillin) resistant coagulase negative staphylococcus. Possible blood culture contaminant (unless isolated from more than one blood culture draw or clinical case suggests pathogenicity). No antibiotic treatment is indicated for blood  culture contaminants. CRITICAL RESULT CALLED TO, READ BACK BY AND VERIFIED WITH: Jene Every PharmD 16:05 03/18/19 ([email or web address])    Staphylococcus aureus (BCID) NOT DETECTED NOT DETECTED Final   Methicillin resistance DETECTED (A) NOT DETECTED Final    Comment: CRITICAL RESULT CALLED TO, READ BACK BY AND VERIFIED WITH: Jene Every PharmD 16:05 03/18/19 ([email or web address])    Streptococcus species NOT DETECTED NOT DETECTED Final   Streptococcus agalactiae NOT DETECTED NOT DETECTED Final   Streptococcus pneumoniae NOT DETECTED NOT DETECTED Final   Streptococcus pyogenes NOT DETECTED NOT DETECTED Final   Acinetobacter baumannii NOT DETECTED NOT DETECTED Final   Enterobacteriaceae species NOT DETECTED NOT DETECTED Final   Enterobacter cloacae complex NOT DETECTED NOT DETECTED Final   Escherichia coli NOT DETECTED NOT DETECTED Final   Klebsiella oxytoca NOT DETECTED  NOT DETECTED Final   Klebsiella pneumoniae NOT DETECTED NOT DETECTED Final   Proteus species NOT DETECTED NOT DETECTED Final   Serratia marcescens NOT DETECTED NOT DETECTED Final   Haemophilus influenzae NOT DETECTED NOT DETECTED Final   Neisseria meningitidis NOT DETECTED NOT DETECTED Final   Pseudomonas aeruginosa NOT DETECTED NOT DETECTED Final   Candida albicans NOT DETECTED NOT DETECTED Final   Candida glabrata NOT DETECTED NOT DETECTED Final   Candida krusei NOT DETECTED NOT DETECTED Final   Candida parapsilosis NOT DETECTED NOT DETECTED Final   Candida tropicalis NOT DETECTED NOT DETECTED Final    Comment: Performed at [facility], [place]. [address], [place], [place] [address]  SARS Coronavirus 2 (CEPHEID- Performed in [facility] lab), Hosp Order     Status: Abnormal   Collection Time: 03/24/2019 11:52 PM  Result Value Ref Range Status   SARS Coronavirus 2 POSITIVE (A) NEGATIVE Final    Comment: RESULT CALLED TO, READ BACK BY AND VERIFIED WITH: P JOHNSTON RN 03/17/18 0131 JDW (NOTE) If result is NEGATIVE SARS-CoV-2 target nucleic acids are NOT DETECTED. The SARS-CoV-2 RNA is generally detectable in upper and lower  respiratory specimens during the acute phase of infection. The lowest  concentration of SARS-CoV-2 viral copies this assay can detect is 250  copies / mL. A negative result does not preclude SARS-CoV-2 infection  and should not be used as the sole basis for treatment or other  patient management decisions.  A negative result may occur with  improper specimen collection / handling, submission of specimen other  than nasopharyngeal swab, presence of viral mutation(s) within the  areas targeted by this assay, and inadequate number of viral copies  (<250 copies / mL). A negative result must be combined with clinical  observations, patient history, and epidemiological information. If result is POSITIVE SARS-CoV-2 target nucleic acids are DETECTED. The SA  RS-CoV-2 RNA is generally detectable in upper and lower  respiratory specimens during the acute phase of infection.  Positive  results are indicative of active infection with SARS-CoV-2.  Clinical  correlation with patient history and  other diagnostic information is  necessary to determine patient infection status.  Positive results do  not rule out bacterial infection or co-infection with other viruses. If result is PRESUMPTIVE POSTIVE SARS-CoV-2 nucleic acids MAY BE PRESENT.   A presumptive positive result was obtained on the submitted specimen  and confirmed on repeat testing.  While 2019 novel coronavirus  (SARS-CoV-2) nucleic acids may be present in the submitted sample  additional confirmatory testing may be necessary for epidemiological  and / or clinical management purposes  to differentiate between  SARS-CoV-2 and other Sarbecovirus currently known to infect humans.  If clinically indicated additional testing with an alternate test  methodology [phone]) is advi sed. The SARS-CoV-2 RNA is generally  detectable in upper and lower respiratory specimens during the acute  phase of infection. The expected result is Negative. Fact Sheet for Patients:  StrictlyIdeas.no Fact Sheet for Healthcare Providers: BankingDealers.co.za This test is not yet approved or cleared by the [place] FDA and has been authorized for detection and/or diagnosis of SARS-CoV-2 by FDA under an Emergency Use Authorization (EUA).  This EUA will remain in effect (meaning this test can be used) for the duration of the COVID-19 declaration under Section 564(b)(1) of the Act, 21 U.S.C. section 360bbb-3(b)(1), unless the authorization is terminated or revoked sooner. Performed at [facility], [place] [address], [place], [place] [address]   Urine culture     Status: Abnormal   Collection Time: 03/18/19 12:46 AM  Result Value Ref Range Status   Specimen  Description URINE, CLEAN CATCH  Final   Special Requests NONE  Final   Culture (A)  Final    <10,000 COLONIES/mL INSIGNIFICANT GROWTH Performed at [facility], [address], [place], [place] [address]    Report Status 03/19/2019 FINAL  Final  Culture, blood (routine x 2) Call MD if unable to obtain prior to antibiotics being given     Status: None (Preliminary result)   Collection Time: 03/18/19  4:00 PM  Result Value Ref Range Status   Specimen Description   Final    BLOOD RIGHT ARM Performed at [facility] [address], [place], [place] [address]    Special Requests   Final    BOTTLES DRAWN AEROBIC ONLY Blood Culture results may not be optimal due to an inadequate volume of blood received in culture bottles   Culture   Final    NO GROWTH 2 DAYS Performed at [facility], [place] [address] [place], [place] [address]    Report Status PENDING  Incomplete  Culture, blood (routine x 2) Call MD if unable to obtain prior to antibiotics being given     Status: None (Preliminary result)   Collection Time: 03/18/19  4:00 PM  Result Value Ref Range Status   Specimen Description   Final    BLOOD RIGHT HAND Performed at [facility] [address] [place], [place] [address]    Special Requests   Final    BOTTLES DRAWN AEROBIC ONLY Blood Culture results may not be optimal due to an inadequate volume of blood received in culture bottles   Culture   Final    NO GROWTH 2 DAYS Performed at [facility], [place] [address], [place],  [address]    Report Status PENDING  Incomplete  MRSA PCR Screening     Status: None   Collection Time: 03/19/19  2:00 PM  Result Value Ref Range Status  MRSA by PCR NEGATIVE NEGATIVE Final    Comment:        The GeneXpert MRSA Assay (FDA approved for NASAL specimens only), is one component of a comprehensive MRSA colonization surveillance program. It is not intended to diagnose MRSA infection  nor to guide or monitor treatment for MRSA infections. Performed at [facility], [place] [address], [place],  [address]      Scheduled Meds: . heparin  5,000 Units Subcutaneous Q8H  . insulin aspart  0-9 Units Subcutaneous Q4H  . methylPREDNISolone (SOLU-MEDROL) injection  40 mg Intravenous Q8H  . midodrine  5 mg Oral TID WC  . vitamin C  500 mg Oral BID  . zinc sulfate  220 mg Oral Daily   Continuous Infusions: . ceFEPime (MAXIPIME) IV Stopped (03/20/19 0914)  . vancomycin 150 mL/hr at 03/20/19 1103     LOS: 2 days   Cherene Altes, MD [facility] Office  [phone] Pager - Text Page per Amion  If 7PM-7AM, please contact night-coverage per Amion 03/20/2019, 11:35 AM

## 2019-03-20 NOTE — Progress Notes (Signed)
Inpatient Diabetes Program Recommendations  AACE/ADA: New Consensus Statement on Inpatient Glycemic Control (2015)  Target Ranges:  Prepandial:   less than 140 mg/dL      Peak postprandial:   less than 180 mg/dL (1-2 hours)      Critically ill patients:  140 - 180 mg/dL   Lab Results  Component Value Date   GLUCAP 348 (H) 03/20/2019   HGBA1C 14.9 (H) 03/18/2019    Results for Mujahid, [facility] (MRN [id number]) as of 03/20/2019 14:48  Ref. Range 03/19/2019 08:06 03/19/2019 12:26 03/19/2019 16:55 03/19/2019 21:12 03/20/2019 00:21 03/20/2019 04:20 03/20/2019 08:18 03/20/2019 11:05  Glucose-Capillary Latest Ref Range: 70 - 99 mg/dL 168 (H)  Novolog 2 units  204 (H)  Novolog 3 units  224 (H)  Novolog 3 units  276 (H)  Novolog 5 units  274 (H)  Novolog 5 units 245 (H)  Novolog 3 units  243 (H)  Novolog 3 units  348 (H)  Novolog 7 units      Review of Glycemic Control  Diabetes history: DM2   Outpatient Diabetes medications: Lantus 9 units QHS (per chart inconsistent with taking)  Current orders for Inpatient glycemic control: Novolog (0-9) sensitive scale Q4  Solumedrol 40 mg Q8 hours    Patient's CBG running above target goals. He is now eating and CBG elevated post prandially.    MD please consider the following adjustments:   1. Within the Covid-19 glycemic control order set : Levemir 4 units BID (based on 0.075 u/kg)  2. Within Covid-19 glycemic control order set: Novolog 3 units meal coverage tid (Hold if NPO or eats less than 50% of meal)  3. Decrease Novolog sensitive scale (0-9 units) frequency to tid ac and (0-5) hs  4. Add Carb Modified to current diet     -- Will follow during hospitalization.--  Jonna Clark RN, MSN Diabetes Coordinator Inpatient Glycemic Control Team Team Pager: [phone] (8am-5pm)

## 2019-03-20 NOTE — Progress Notes (Signed)
Pt continues to deteriorate mainly bc he is pulling off his oxygen due to confusion then desats significantly.  Pt is full code.  RN called to notify of issues.  Pt proning.  Will order sitter and see if that helps to keep his o2 on.  Have called family to update and verify code status.  Unfortunately, one of Brian Owen daughters this am was found dead at home likely due to covid infection.  Have spoken to other dtr ms shavon and her husband to update.  They wish everything to be done and are aware his likelihood of survival if requiring intubation is very low.  Pt is FULL CODE confirmed.  Highly suspect he will ultimately need intubation.  Will get pccm involved.

## 2019-03-20 NOTE — Significant Event (Signed)
Rapid Response Event Note  Overview:  Called to pt's room d/t low sO2% low 80s despite NRB. Primary RN reports pt occasionally takes mask off, desats into low 40s and has corresponding bradycardia into 40s until sats come up after reapplying oxygen.     Initial Focused Assessment: On arrival to room pt sats 82% with NRB in place. Changed sat probe, sats mid 90s. HR 60s-80s SR. Pt resting comfortably. RT assessed as well. Breathing unlabored and symmetrical. Pulses present. Pt able to turn to side on command  Interventions:  Initially applied [place] under NRB to reach sats in 90s, then able to back off to just the NRB. Switched sat probes, which showed much improved reading in 90s. Now satting 99% on NRB only. Can turn to side and self prone when needed, which improves sats as well.  Plan of Care (if not transferred): Main concern is pt sundowning, pulling off NRB mask, as this causes pt to acutely decompensate. Will address sitter need with AC. RT left [place] w/O2 tank in room in case pt acutely desats again from removing mask - told to notify RT if it needs to be used.  Event Summary: Pt comfortable, no acute concerns or need for change of level of care at this time.    Brian Owen

## 2019-03-20 NOTE — Progress Notes (Addendum)
Called daughter Georgiana Shore to update her, no answer this morning. Did give her my direct number to call if she had any questions. Will continue to monitor patient closely.  Addendum: Savon called back and she was updated.

## 2019-03-21 ENCOUNTER — Inpatient Hospital Stay (HOSPITAL_COMMUNITY): Payer: Medicare Other

## 2019-03-21 DIAGNOSIS — J069 Acute upper respiratory infection, unspecified: Secondary | ICD-10-CM

## 2019-03-21 DIAGNOSIS — U071 COVID-19: Secondary | ICD-10-CM

## 2019-03-21 DIAGNOSIS — J1289 Other viral pneumonia: Secondary | ICD-10-CM

## 2019-03-21 DIAGNOSIS — J9601 Acute respiratory failure with hypoxia: Secondary | ICD-10-CM

## 2019-03-21 DIAGNOSIS — N179 Acute kidney failure, unspecified: Secondary | ICD-10-CM

## 2019-03-21 LAB — CBC WITH DIFFERENTIAL/PLATELET
Abs Immature Granulocytes: 0.12 10*3/uL — ABNORMAL HIGH (ref 0.00–0.07)
Basophils Absolute: 0 10*3/uL (ref 0.0–0.1)
Basophils Relative: 0 %
Eosinophils Absolute: 0 10*3/uL (ref 0.0–0.5)
Eosinophils Relative: 0 %
HCT: 33.2 % — ABNORMAL LOW (ref 39.0–52.0)
Hemoglobin: 10.9 g/dL — ABNORMAL LOW (ref 13.0–17.0)
Immature Granulocytes: 2 %
Lymphocytes Relative: 5 %
Lymphs Abs: 0.3 10*3/uL — ABNORMAL LOW (ref 0.7–4.0)
MCH: 28.9 pg (ref 26.0–34.0)
MCHC: 32.8 g/dL (ref 30.0–36.0)
MCV: 88.1 fL (ref 80.0–100.0)
Monocytes Absolute: 0.1 10*3/uL (ref 0.1–1.0)
Monocytes Relative: 2 %
Neutro Abs: 5.8 10*3/uL (ref 1.7–7.7)
Neutrophils Relative %: 91 %
Platelets: 156 10*3/uL (ref 150–400)
RBC: 3.77 MIL/uL — ABNORMAL LOW (ref 4.22–5.81)
RDW: 14 % (ref 11.5–15.5)
WBC: 6.4 10*3/uL (ref 4.0–10.5)
nRBC: 0 % (ref 0.0–0.2)

## 2019-03-21 LAB — POCT I-STAT 7, (LYTES, BLD GAS, ICA,H+H)
Acid-base deficit: 8 mmol/L — ABNORMAL HIGH (ref 0.0–2.0)
Bicarbonate: 19.4 mmol/L — ABNORMAL LOW (ref 20.0–28.0)
Calcium, Ion: 1.4 mmol/L (ref 1.15–1.40)
HCT: 28 % — ABNORMAL LOW (ref 39.0–52.0)
Hemoglobin: 9.5 g/dL — ABNORMAL LOW (ref 13.0–17.0)
O2 Saturation: 93 %
Patient temperature: 98
Potassium: 4.5 mmol/L (ref 3.5–5.1)
Sodium: 143 mmol/L (ref 135–145)
TCO2: 21 mmol/L — ABNORMAL LOW (ref 22–32)
pCO2 arterial: 45.5 mmHg (ref 32.0–48.0)
pH, Arterial: 7.236 — ABNORMAL LOW (ref 7.350–7.450)
pO2, Arterial: 78 mmHg — ABNORMAL LOW (ref 83.0–108.0)

## 2019-03-21 LAB — HEPATIC FUNCTION PANEL
ALT: 24 U/L (ref 0–44)
AST: 47 U/L — ABNORMAL HIGH (ref 15–41)
Albumin: 2.2 g/dL — ABNORMAL LOW (ref 3.5–5.0)
Alkaline Phosphatase: 106 U/L (ref 38–126)
Bilirubin, Direct: 0.1 mg/dL (ref 0.0–0.2)
Indirect Bilirubin: 0.7 mg/dL (ref 0.3–0.9)
Total Bilirubin: 0.8 mg/dL (ref 0.3–1.2)
Total Protein: 6 g/dL — ABNORMAL LOW (ref 6.5–8.1)

## 2019-03-21 LAB — FERRITIN: Ferritin: 571 ng/mL — ABNORMAL HIGH (ref 24–336)

## 2019-03-21 LAB — GLUCOSE, CAPILLARY
Glucose-Capillary: 379 mg/dL — ABNORMAL HIGH (ref 70–99)
Glucose-Capillary: 319 mg/dL — ABNORMAL HIGH (ref 70–99)
Glucose-Capillary: 270 mg/dL — ABNORMAL HIGH (ref 70–99)
Glucose-Capillary: 204 mg/dL — ABNORMAL HIGH (ref 70–99)
Glucose-Capillary: 174 mg/dL — ABNORMAL HIGH (ref 70–99)

## 2019-03-21 LAB — C-REACTIVE PROTEIN: CRP: 16.6 mg/dL — ABNORMAL HIGH (ref <1.0)

## 2019-03-21 LAB — D-DIMER, QUANTITATIVE: D-Dimer, Quant: 2.38 ug/mL-FEU — ABNORMAL HIGH (ref 0.00–0.50)

## 2019-03-21 MED ORDER — VECURONIUM BROMIDE 10 MG IV SOLR
INTRAVENOUS | Status: AC
  Filled 2019-03-21: qty 10

## 2019-03-21 MED ORDER — MIDAZOLAM HCL 2 MG/2ML IJ SOLN
INTRAMUSCULAR | Status: AC
  Administered 2019-03-21: 2 mg
  Filled 2019-03-21: qty 4

## 2019-03-21 MED ORDER — STERILE WATER FOR INJECTION IJ SOLN
INTRAMUSCULAR | Status: AC
  Filled 2019-03-21: qty 10

## 2019-03-21 MED ORDER — LORAZEPAM 2 MG/ML IJ SOLN
0.5000 mg | Freq: Four times a day (QID) | INTRAMUSCULAR | Status: DC | PRN
  Administered 2019-03-21: 0.5 mg via INTRAVENOUS
  Filled 2019-03-21: qty 1

## 2019-03-21 MED ORDER — SUCCINYLCHOLINE CHLORIDE 200 MG/10ML IV SOSY
PREFILLED_SYRINGE | INTRAVENOUS | Status: AC
  Filled 2019-03-21: qty 10

## 2019-03-21 MED ORDER — PNEUMOCOCCAL VAC POLYVALENT 25 MCG/0.5ML IJ INJ
0.5000 mL | INJECTION | INTRAMUSCULAR | Status: DC
  Filled 2019-03-21: qty 0.5

## 2019-03-21 MED ORDER — ETOMIDATE 2 MG/ML IV SOLN
INTRAVENOUS | Status: AC
  Administered 2019-03-21: 15:00:00 200 mg
  Filled 2019-03-21: qty 20

## 2019-03-21 MED ORDER — ROCURONIUM BROMIDE 50 MG/5ML IV SOSY
PREFILLED_SYRINGE | INTRAVENOUS | Status: AC
  Filled 2019-03-21: qty 10

## 2019-03-21 MED ORDER — FUROSEMIDE 10 MG/ML IJ SOLN
40.0000 mg | Freq: Once | INTRAMUSCULAR | Status: AC
  Administered 2019-03-21: 40 mg via INTRAVENOUS
  Filled 2019-03-21: qty 4

## 2019-03-21 MED ORDER — MIDAZOLAM HCL 2 MG/2ML IJ SOLN
1.0000 mg | INTRAMUSCULAR | Status: DC | PRN
  Administered 2019-03-23 – 2019-03-26 (×7): 1 mg via INTRAVENOUS
  Filled 2019-03-21 (×6): qty 2

## 2019-03-21 MED ORDER — CHLORHEXIDINE GLUCONATE 0.12% ORAL RINSE (MEDLINE KIT)
15.0000 mL | Freq: Two times a day (BID) | OROMUCOSAL | Status: DC
  Administered 2019-03-21 – 2019-03-23 (×4): 15 mL via OROMUCOSAL

## 2019-03-21 MED ORDER — INSULIN ASPART 100 UNIT/ML ~~LOC~~ SOLN
0.0000 [IU] | SUBCUTANEOUS | Status: DC
  Administered 2019-03-21: 3 [IU] via SUBCUTANEOUS
  Administered 2019-03-21 – 2019-03-22 (×2): 2 [IU] via SUBCUTANEOUS
  Administered 2019-03-22: 3 [IU] via SUBCUTANEOUS
  Administered 2019-03-22: 2 [IU] via SUBCUTANEOUS
  Administered 2019-03-22: 1 [IU] via SUBCUTANEOUS
  Administered 2019-03-22: 2 [IU] via SUBCUTANEOUS
  Administered 2019-03-22: 1 [IU] via SUBCUTANEOUS
  Administered 2019-03-23: 3 [IU] via SUBCUTANEOUS
  Administered 2019-03-23: 5 [IU] via SUBCUTANEOUS
  Administered 2019-03-23: 08:00:00 2 [IU] via SUBCUTANEOUS
  Administered 2019-03-23 (×3): 3 [IU] via SUBCUTANEOUS
  Administered 2019-03-23: 5 [IU] via SUBCUTANEOUS
  Administered 2019-03-24 (×2): 3 [IU] via SUBCUTANEOUS
  Administered 2019-03-24: 2 [IU] via SUBCUTANEOUS
  Administered 2019-03-24 (×3): 3 [IU] via SUBCUTANEOUS
  Administered 2019-03-25 (×2): 5 [IU] via SUBCUTANEOUS

## 2019-03-21 MED ORDER — FENTANYL CITRATE (PF) 100 MCG/2ML IJ SOLN
25.0000 ug | Freq: Once | INTRAMUSCULAR | Status: AC
  Administered 2019-03-21: 25 ug via INTRAVENOUS

## 2019-03-21 MED ORDER — MIDAZOLAM HCL 2 MG/2ML IJ SOLN
1.0000 mg | INTRAMUSCULAR | Status: AC | PRN
  Administered 2019-03-22 – 2019-03-23 (×3): 1 mg via INTRAVENOUS
  Filled 2019-03-21 (×5): qty 2

## 2019-03-21 MED ORDER — TOCILIZUMAB 400 MG/20ML IV SOLN
8.0000 mg/kg | Freq: Once | INTRAVENOUS | Status: AC
  Administered 2019-03-21: 466 mg via INTRAVENOUS
  Filled 2019-03-21: qty 20

## 2019-03-21 MED ORDER — FENTANYL 2500MCG IN NS 250ML (10MCG/ML) PREMIX INFUSION
25.0000 ug/h | INTRAVENOUS | Status: DC
  Administered 2019-03-21: 75 ug/h via INTRAVENOUS
  Administered 2019-03-21: 25 ug/h via INTRAVENOUS
  Administered 2019-03-23: 125 ug/h via INTRAVENOUS
  Filled 2019-03-21 (×2): qty 250

## 2019-03-21 MED ORDER — METHYLPREDNISOLONE SODIUM SUCC 40 MG IJ SOLR
40.0000 mg | Freq: Three times a day (TID) | INTRAMUSCULAR | Status: AC
  Administered 2019-03-21 (×2): 40 mg via INTRAVENOUS
  Filled 2019-03-21 (×2): qty 1

## 2019-03-21 MED ORDER — INSULIN DETEMIR 100 UNIT/ML ~~LOC~~ SOLN
8.0000 [IU] | Freq: Two times a day (BID) | SUBCUTANEOUS | Status: DC
  Administered 2019-03-21 – 2019-03-25 (×8): 8 [IU] via SUBCUTANEOUS
  Filled 2019-03-21 (×8): qty 0.08

## 2019-03-21 MED ORDER — ORAL CARE MOUTH RINSE
15.0000 mL | OROMUCOSAL | Status: DC
  Administered 2019-03-21 – 2019-03-31 (×92): 15 mL via OROMUCOSAL

## 2019-03-21 MED ORDER — FENTANYL BOLUS VIA INFUSION
25.0000 ug | INTRAVENOUS | Status: DC | PRN
  Administered 2019-03-22 – 2019-03-24 (×17): 25 ug via INTRAVENOUS
  Filled 2019-03-21: qty 25

## 2019-03-21 MED ORDER — PROPOFOL 10 MG/ML IV BOLUS
INTRAVENOUS | Status: AC
  Filled 2019-03-21: qty 20

## 2019-03-21 MED ORDER — FENTANYL CITRATE (PF) 100 MCG/2ML IJ SOLN
INTRAMUSCULAR | Status: AC
  Administered 2019-03-21: 15:00:00 50 ug
  Filled 2019-03-21: qty 2

## 2019-03-21 MED ORDER — FAMOTIDINE IN NACL 20-0.9 MG/50ML-% IV SOLN
20.0000 mg | Freq: Every day | INTRAVENOUS | Status: DC
  Administered 2019-03-21: 20 mg via INTRAVENOUS
  Filled 2019-03-21: qty 50

## 2019-03-21 NOTE — Procedures (Signed)
Central Venous Catheter Insertion Procedure Note [place] Current [id number] November 04, 1950  Procedure: Insertion of Central Venous Catheter Indications: Drug and/or fluid administration  Procedure Details Consent: Risks of procedure as well as the alternatives and risks of each were explained to the (patient/caregiver).  Consent for procedure obtained. and Unable to obtain consent because of emergent medical necessity. Time Out: Verified patient identification, verified procedure, site/side was marked, verified correct patient position, special equipment/implants available, medications/allergies/relevent history reviewed, required imaging and test results available.  Performed  Maximum sterile technique was used including antiseptics, cap, gloves, gown, hand hygiene, mask and sheet. Skin prep: Chlorhexidine; local anesthetic administered A antimicrobial bonded/coated triple lumen catheter was placed in the right internal jugular vein using the Seldinger technique.  Evaluation Blood flow good Complications: No apparent complications Patient did tolerate procedure well. Chest X-ray ordered to verify placement.  CXR: normal.   Baltazar Apo, MD, PhD 03/21/2019, 4:57 PM [facility] [phone] or if no answer [phone]

## 2019-03-21 NOTE — Progress Notes (Signed)
**Note De-identified  Obfuscation** Sputum collected and sent to lab 

## 2019-03-21 NOTE — Procedures (Signed)
Intubation Procedure Note Brian Owen [id number] Jul 09, 1951  Procedure: Intubation Indications: Respiratory insufficiency  Procedure Details Consent: Risks of procedure as well as the alternatives and risks of each were explained to the (patient/caregiver).  Consent for procedure obtained. Time Out: Verified patient identification, verified procedure, site/side was marked, verified correct patient position, special equipment/implants available, medications/allergies/relevent history reviewed, required imaging and test results available.  Performed  Maximum sterile technique was used including cap, gloves, gown, hand hygiene and mask.  4.0 Mac glidescope  7.5 ETT placed without difficulty. Location confirmed by ETCO2, direct vis, auscultation    Evaluation Hemodynamic Status: BP stable throughout; O2 sats: were low prior to and during the procedure Patient's Current Condition: stable Complications: No apparent complications Patient did tolerate procedure well. Chest X-ray ordered to verify placement.  CXR: pending.   Brian Owen 03/21/2019

## 2019-03-21 NOTE — Consult Note (Signed)
NAMEWeylin Owen, MRN:  [id number], DOB:  April 08, 1951, LOS: 3 ADMISSION DATE:  03/13/2019, CONSULTATION DATE:  03/21/2019 REFERRING MD:  Dr Thereasa Solo, CHIEF COMPLAINT:  Acute Respiratory Failure   Brief History     History of present illness   68 year old [place] man with hypertension, diabetes, CKD stage III, autonomic dysfunction with orthostatic hypotension.  Admitted with weakness and hypotension, tachypnea 5/19.  COVID-19 positive with bilateral pulmonary infiltrates.  New acute on chronic renal insufficiency.  Received Actemra x2, corticosteroids 5/21 through 5/23.  Experienced significant decline in his oxygenation and mental status beginning late 5/22, difficulty tolerating O2 mask, prone positioning. Moved to the ICU 5/23 with progressive hypoxic respiratory failure requiring intubation for mechanical ventilation.  Past Medical History   has a past medical history of Bullae (11/11/2015), Chronic kidney disease, Heavy cigarette smoker, Hypertension, Parasite infection (2016), and Uncontrolled type II diabetes mellitus ([place]) (dx'd 2013).   Significant Hospital Events   Intubated 5/23  Consults:    Procedures:  ET tube 5/23 >>  Right IJ CVC 5/23 >>   Significant Diagnostic Tests:    Micro Data:  SARS CoV2 5/19 >> positive Urine 5/20 >> insignificant growth Blood 5/19 >> 1 of 2 MRSE (? Contaminant) Blood 5/20 >>  Resp 5/23 >>   Antimicrobials:  Azithro 5/19 x 1 Ceftriaxone 5/19 x 1 Cefepime 5/20 >>  vanco 5/20 >> 5/22  Interim history/subjective:  To ICU with SpO2 in 70's on 1.00 mask and HFNC >> intubated CV placed.  Well tolerated  Objective   Blood pressure (!) 83/54, pulse (!) 57, temperature 98 F (36.7 C), temperature source Axillary, resp. rate (!) 40, height '5\' 1"'  (1.549 m), weight 58.3 kg, SpO2 100 %.    Vent Mode: PRVC FiO2 (%):  [70 %-100 %] 70 % Set Rate:  [28 bmp] 28 bmp Vt Set:  [280 mL] 280 mL PEEP:  [10 cmH20] 10 cmH20   Intake/Output Summary  (Last 24 hours) at 03/21/2019 1658 Last data filed at 03/20/2019 1731 Gross per 24 hour  Intake 0 ml  Output -  Net 0 ml   Filed Weights   03/18/19 0423  Weight: 58.3 kg    Examination: General: Cachectic man, ill-appearing, tachypneic, intubated HENT: ET tube in place, edentulous Lungs: Coarse bilaterally, no wheezing Cardiovascular: Regular, tachycardic, distant, no murmur Abdomen: Soft, nondistended, hypoactive bowel sounds Extremities: No significant edema Neuro: Obtunded, unresponsive to pain, voice  Resolved Hospital Problem list     Assessment & Plan:  Acute hypoxic respiratory failure/ARDS due to COVID-19 PRVC 6cc/kg, titrate PEEP, FiO2 as able Consider paralysis, prone positioning if P/F ratio remains low post-stabilization Obtain respiratory culture today, continue cefepime for now Second dose Actemra today Corticosteroid course completed Follow inflammatory markers, CRP, ferritin, d-dimer, IL-6 No antivirals due to renal failure   History of autonomic dysfunction and chronic hypotension, at risk for septic shock Restart Midodrin per tube we have gastric access May require pressors CVC placed, follow CVP.  Goal 5-8 given ARDS  Acute on chronic renal failure (baseline S Cr 2.0), improving Follow urine output, BMP Avoid nephrotoxins Ensure adequate perfusion  MRSE 1 of 2 blood cultures, likely contaminant No indication to treat at this time.  Could reconsider vancomycin if he clinically declined  Toxic metabolic encephalopathy Need for sedation to tolerate mechanical ventilation Fentanyl drip, Versed intermittently. RASS -3, -4  DM2 Sliding scale insulin per protocol [place]   Best practice:  Diet: N.p.o. Pain/Anxiety/Delirium protocol (if indicated): Fentanyl drip, intermittent  Versed VAP protocol (if indicated): Ordered DVT prophylaxis: Heparin subcutaneous GI prophylaxis: Pepcid Glucose control: SSI, Levimir Mobility: BR Code Status: Full  Family Communication: Dr Thereasa Solo discussed with pt's family 5/23. Confirmed code status and goals. Disposition: ICU  Labs   CBC: Recent Labs  Lab 03/15/19 1817 03/09/2019 2139 03/12/2019 2235 03/18/19 0953 03/19/19 0516 03/20/19 0525 03/21/19 0419  WBC 7.3 7.4  --   --  7.5 4.6 6.4  NEUTROABS  --  5.7  --   --  6.8 4.2 5.8  HGB 11.1* 10.5* 9.5* 8.5* 10.8* 11.2* 10.9*  HCT 33.9* 32.2* 28.0* 25.0* 33.5* 34.4* 33.2*  MCV 90.6 89.2  --   --  90.5 89.6 88.1  PLT 122* 127*  --   --  149* 141* 161    Basic Metabolic Panel: Recent Labs  Lab 03/15/19 1817 03/03/2019 2139 03/24/2019 2235 03/18/19 0118 03/18/19 0953 03/19/19 0516 03/20/19 0525  NA 130* 135 135 134* 141 141 138  K 4.7 4.5 4.6 4.4 4.1 4.8 4.5  CL 97* 102  --  105  --  114* 111  CO2 22 18*  --  16*  --  18* 18*  GLUCOSE 449* 436*  --  414*  --  163* 296*  BUN 23 41*  --  42*  --  54* 59*  CREATININE 2.06* 3.36*  --  3.00*  --  3.18* 2.61*  CALCIUM 8.2* 8.6*  --  7.9*  --  8.3* 8.8*   GFR: Estimated Creatinine Clearance: 20 mL/min (A) (by C-G formula based on SCr of 2.61 mg/dL (H)). Recent Labs  Lab 03/25/2019 2139 03/24/2019 2155 03/03/2019 2355 03/18/19 0118 03/18/19 1600 03/19/19 0516 03/20/19 0525 03/21/19 0419  PROCALCITON  --   --   --  88.27  --   --   --   --   WBC 7.4  --   --   --   --  7.5 4.6 6.4  LATICACIDVEN  --  3.2* 1.5  --  2.1*  --   --   --     Liver Function Tests: Recent Labs  Lab 03/13/2019 2139 03/21/19 0419  AST 50* 47*  ALT 19 24  ALKPHOS 59 106  BILITOT 0.7 0.8  PROT 6.8 6.0*  ALBUMIN 2.6* 2.2*   Recent Labs  Lab 03/29/2019 2139  LIPASE 24   No results for input(s): AMMONIA in the last 168 hours.  ABG    Component Value Date/Time   PHART 7.277 (L) 03/18/2019 0953   PCO2ART 43.4 03/18/2019 0953   PO2ART 57.0 (L) 03/18/2019 0953   HCO3 20.2 03/18/2019 0953   TCO2 21 (L) 03/18/2019 0953   ACIDBASEDEF 6.0 (H) 03/18/2019 0953   O2SAT 85.0 03/18/2019 0953     Coagulation  Profile: No results for input(s): INR, PROTIME in the last 168 hours.  Cardiac Enzymes: Recent Labs  Lab 03/18/19 0118  TROPONINI 0.04*    HbA1C: Hgb A1c MFr Bld  Date/Time Value Ref Range Status  03/18/2019 10:45 AM 14.9 (H) 4.8 - 5.6 % Final    Comment:    (NOTE) Pre diabetes:          5.7%-6.4% Diabetes:              >6.4% Glycemic control for   <7.0% adults with diabetes   03/18/2019 01:18 AM 14.9 (H) 4.8 - 5.6 % Final    Comment:    (NOTE) Pre diabetes:  5.7%-6.4% Diabetes:              >6.4% Glycemic control for   <7.0% adults with diabetes     CBG: Recent Labs  Lab 03/20/19 1612 03/20/19 2056 03/21/19 0802 03/21/19 1139 03/21/19 1644  GLUCAP 379* 316* 319* 270* 204*    Review of Systems:   Unable to obtain  Past Medical History  He,  has a past medical history of Bullae (11/11/2015), Chronic kidney disease, Heavy cigarette smoker, Hypertension, Parasite infection (2016), and Uncontrolled type II diabetes mellitus ([place]) (dx'd 2013).   Surgical History    Past Surgical History:  Procedure Laterality Date  . NO PAST SURGERIES       Social History   reports that he quit smoking about 5 years ago. His smoking use included cigarettes. He has a 72.00 pack-year smoking history. He has never used smokeless tobacco. He reports that he does not drink alcohol or use drugs.   Family History   His family history includes Diabetes in some other family members.   Allergies No Known Allergies   Home Medications  Prior to Admission medications   Medication Sig Start Date End Date Taking? Authorizing Provider  ACCU-CHEK SOFTCLIX LANCETS lancets 1 each by Other route 3 (three) times daily. 08/13/18   Hayden Rasmussen, MD  acetaminophen (TYLENOL) 500 MG tablet Take 500 mg by mouth 2 (two) times daily.    [provider]  Blood Glucose Monitoring Suppl (ACCU-CHEK AVIVA PLUS) w/Device KIT 1 Device by Does not apply route 3 (three) times daily  after meals. 08/13/18   Hayden Rasmussen, MD  glucose blood (ACCU-CHEK AVIVA PLUS) test strip 1 each by Other route 3 (three) times daily. 03/13/19   Duffy Bruce, MD  insulin glargine (LANTUS) 100 UNIT/ML injection Inject 0.09 mLs (9 Units total) into the skin at bedtime. 03/13/19   Duffy Bruce, MD     Critical care time: 64 min     Baltazar Apo, MD, PhD 03/21/2019, 5:19 PM [facility] [phone] or if no answer [phone]

## 2019-03-21 NOTE — Progress Notes (Signed)
[place] TEAM 1 - Stepdown/ICU TEAM  Brian Owen  OVF:[id number] DOB: 08-14-1951 DOA: 03/13/2019 PCP: Alfonse Spruce, FNP    Brief Narrative:  84ZY w/ a hx of HTN, DM, CKD Stage 3, and multiple prior admissions for orthostatic hypotension related to autonomic dysfunction (noncompliant w/ midodrine) who presented w/ fever generalized weakness hypotension and tachypnea after not feeling well in general for a week. He was seen on 5/17 for orthostatic hypotension and was given fluids started and midodrine and discharged home.  In the ED he was found to be hyperglycemic and febrile with a creatinine of 3.3 (baseline 2.0) and a CXR noting multifocal interstitial opacities.    Significant Events: 5/19 admit  5/22 saturations declining 5/23 transfer to ICU - intubated - R IJ CVL  COVID-19 specific Treatment: Actemra 5/21 & 5/23 Steroids 5/21 > 5/23  Subjective: Experienced significant decline yesterday, with progressive severe hypoxia requiring maximal O2 support and prone positioning. Remains on NRB this AM but sats 97-100%.  Addendum -  Since the time of my visit the pt has declined again. He is confused and fighting against the NRB mask. His sats are dropping into the mid 80s on 100%NRB in the prone position. His BP has dropped in to the 60Y-30Z systolic.   I have dicussed his situation with PCCM, who agrees with his transfer to the ICU for expected urgent intubation.   I called and spoke with his daughter who confirmed he would want "everything done."   Assessment & Plan:  Acute hypoxic respiratory failure -COVID-19 pneumonia -sepsis Transfer to ICU - intubation pending - PCCM consulted - to be dosed w/ second dose of Actemra today   Recent Labs    03/19/19 0516 03/19/19 0540 03/20/19 0525 03/21/19 0419  DDIMER 3.12*  --  2.17* 2.38*  FERRITIN  --  387* 479* 571*  CRP  --  40.3* 27.9* 16.6*    Autonomic dysfunction /chronic orthostatic hypotension Assess BP after  intubation - may require pressors   Metabolic encephalopathy - acute Mental status declining again along w/ resp distress   DM2 -uncontrolled with hyperglycemia CBG not yet at goal - adjust treatment again and follow  Acute kidney injury on CKD Baseline crt 2.0 - kidney function steadily improving - follow trend  Recent Labs  Lab 03/15/19 1817 03/04/2019 2139 03/18/19 0118 03/19/19 0516 03/20/19 0525  CREATININE 2.06* 3.36* 3.00* 3.18* 2.61*    Mildly elevated troponin Denied chest pain when alert - likely related to renal dysfunction and hypotension - recheck in AM   Positive blood culture Clinically most consistent with contamination - antibiotics not indicated for this issue at present  DVT prophylaxis: Lovenox Code Status: FULL CODE Family Communication:  Disposition Plan: Progressive Care   Consultants:  none  Antimicrobials:  Azithromycin 5/19 Ceftriaxone 5/19 Cefepime 5/20 > Vancomycin 5/20 > 5/22  Objective: Blood pressure 139/77, pulse 78, temperature (!) 97.4 F (36.3 C), temperature source Oral, resp. rate (!) 25, height 5\' 1"  (1.549 m), weight 58.3 kg, SpO2 94 %.  Intake/Output Summary (Last 24 hours) at 03/21/2019 0844 Last data filed at 03/20/2019 1731 Gross per 24 hour  Intake 723.63 ml  Output 0 ml  Net 723.63 ml   Filed Weights   03/18/19 0423  Weight: 58.3 kg    Examination: General: acute resp distress - sedate  Lungs: Poor air movement th/o - diffuse fine crackles  Cardiovascular: Regular rate and rhythm without murmur  Abdomen: Nondistended, soft, bowel sounds hypoactive, no ascites,  no appreciable mass Extremities: No significant cyanosis, clubbing, or edema B LE   CBC: Recent Labs  Lab 03/19/19 0516 03/20/19 0525 03/21/19 0419  WBC 7.5 4.6 6.4  NEUTROABS 6.8 4.2 5.8  HGB 10.8* 11.2* 10.9*  HCT 33.5* 34.4* 33.2*  MCV 90.5 89.6 88.1  PLT 149* 141* 956   Basic Metabolic Panel: Recent Labs  Lab 03/18/19 0118 03/18/19 0953  03/19/19 0516 03/20/19 0525  NA 134* 141 141 138  K 4.4 4.1 4.8 4.5  CL 105  --  114* 111  CO2 16*  --  18* 18*  GLUCOSE 414*  --  163* 296*  BUN 42*  --  54* 59*  CREATININE 3.00*  --  3.18* 2.61*  CALCIUM 7.9*  --  8.3* 8.8*   GFR: Estimated Creatinine Clearance: 20 mL/min (A) (by C-G formula based on SCr of 2.61 mg/dL (H)).  Liver Function Tests: Recent Labs  Lab 02/27/2019 2139  AST 50*  ALT 19  ALKPHOS 59  BILITOT 0.7  PROT 6.8  ALBUMIN 2.6*   Recent Labs  Lab 03/22/2019 2139  LIPASE 24    Coagulation Profile: No results for input(s): INR, PROTIME in the last 168 hours.  Cardiac Enzymes: Recent Labs  Lab 03/18/19 0118  TROPONINI 0.04*    HbA1C: Hgb A1c MFr Bld  Date/Time Value Ref Range Status  03/18/2019 10:45 AM 14.9 (H) 4.8 - 5.6 % Final    Comment:    (NOTE) Pre diabetes:          5.7%-6.4% Diabetes:              >6.4% Glycemic control for   <7.0% adults with diabetes   03/18/2019 01:18 AM 14.9 (H) 4.8 - 5.6 % Final    Comment:    (NOTE) Pre diabetes:          5.7%-6.4% Diabetes:              >6.4% Glycemic control for   <7.0% adults with diabetes     CBG: Recent Labs  Lab 03/20/19 0420 03/20/19 0818 03/20/19 1105 03/20/19 2056 03/21/19 0802  GLUCAP 245* 243* 348* 316* 319*    Recent Results (from the past 240 hour(s))  Blood Culture (routine x 2)     Status: None (Preliminary result)   Collection Time: 03/13/2019  8:24 PM  Result Value Ref Range Status   Specimen Description BLOOD LEFT FOREARM  Final   Special Requests   Final    BOTTLES DRAWN AEROBIC ONLY Blood Culture adequate volume   Culture   Final    NO GROWTH 3 DAYS Performed at [facility], [place] [address] [place], [place] [address]    Report Status PENDING  Incomplete  Blood Culture (routine x 2)     Status: Abnormal   Collection Time: 03/04/2019 10:03 PM  Result Value Ref Range Status   Specimen Description BLOOD RIGHT IV  Final   Special Requests   Final     BOTTLES DRAWN AEROBIC AND ANAEROBIC Blood Culture results may not be optimal due to an excessive volume of blood received in culture bottles   Culture  Setup Time   Final    GRAM POSITIVE COCCI ANAEROBIC BOTTLE ONLY Organism ID to follow CRITICAL RESULT CALLED TO, READ BACK BY AND VERIFIED WITH: Jene Every PharmD 16:05 03/18/19 ([email or web address]) AEROBIC BOTTLE ONLY GRAM POSITIVE RODS CRITICAL RESULT CALLED TO, READ BACK BY AND VERIFIED WITH: Cristopher Estimable [facility] 03/18/19 2128 JDW  Culture (A)  Final    STAPHYLOCOCCUS SPECIES (COAGULASE NEGATIVE) DIPHTHEROIDS(CORYNEBACTERIUM SPECIES) THE SIGNIFICANCE OF ISOLATING THIS ORGANISM FROM A SINGLE SET OF BLOOD CULTURES WHEN MULTIPLE SETS ARE DRAWN IS UNCERTAIN. PLEASE NOTIFY THE MICROBIOLOGY DEPARTMENT WITHIN ONE WEEK IF SPECIATION AND SENSITIVITIES ARE REQUIRED. Performed at [facility], [place] [address] [place], [place] [address]    Report Status 03/19/2019 FINAL  Final  Blood Culture ID Panel (Reflexed)     Status: Abnormal   Collection Time: 03/21/2019 10:03 PM  Result Value Ref Range Status   Enterococcus species NOT DETECTED NOT DETECTED Final   Listeria monocytogenes NOT DETECTED NOT DETECTED Final   Staphylococcus species DETECTED (A) NOT DETECTED Final    Comment: Methicillin (oxacillin) resistant coagulase negative staphylococcus. Possible blood culture contaminant (unless isolated from more than one blood culture draw or clinical case suggests pathogenicity). No antibiotic treatment is indicated for blood  culture contaminants. CRITICAL RESULT CALLED TO, READ BACK BY AND VERIFIED WITH: Jene Every PharmD 16:05 03/18/19 ([email or web address])    Staphylococcus aureus (BCID) NOT DETECTED NOT DETECTED Final   Methicillin resistance DETECTED (A) NOT DETECTED Final    Comment: CRITICAL RESULT CALLED TO, READ BACK BY AND VERIFIED WITH: Jene Every PharmD 16:05 03/18/19 ([email or web address])    Streptococcus species NOT DETECTED NOT DETECTED Final   Streptococcus agalactiae NOT  DETECTED NOT DETECTED Final   Streptococcus pneumoniae NOT DETECTED NOT DETECTED Final   Streptococcus pyogenes NOT DETECTED NOT DETECTED Final   Acinetobacter baumannii NOT DETECTED NOT DETECTED Final   Enterobacteriaceae species NOT DETECTED NOT DETECTED Final   Enterobacter cloacae complex NOT DETECTED NOT DETECTED Final   Escherichia coli NOT DETECTED NOT DETECTED Final   Klebsiella oxytoca NOT DETECTED NOT DETECTED Final   Klebsiella pneumoniae NOT DETECTED NOT DETECTED Final   Proteus species NOT DETECTED NOT DETECTED Final   Serratia marcescens NOT DETECTED NOT DETECTED Final   Haemophilus influenzae NOT DETECTED NOT DETECTED Final   Neisseria meningitidis NOT DETECTED NOT DETECTED Final   Pseudomonas aeruginosa NOT DETECTED NOT DETECTED Final   Candida albicans NOT DETECTED NOT DETECTED Final   Candida glabrata NOT DETECTED NOT DETECTED Final   Candida krusei NOT DETECTED NOT DETECTED Final   Candida parapsilosis NOT DETECTED NOT DETECTED Final   Candida tropicalis NOT DETECTED NOT DETECTED Final    Comment: Performed at [facility], [place]. [address], [place], [place] [address]  SARS Coronavirus 2 (CEPHEID- Performed in [facility]), Hosp Order     Status: Abnormal   Collection Time: 02/28/2019 11:52 PM  Result Value Ref Range Status   SARS Coronavirus 2 POSITIVE (A) NEGATIVE Final    Comment: RESULT CALLED TO, READ BACK BY AND VERIFIED WITH: P JOHNSTON RN 03/17/18 0131 JDW (NOTE) If result is NEGATIVE SARS-CoV-2 target nucleic acids are NOT DETECTED. The SARS-CoV-2 RNA is generally detectable in upper and lower  respiratory specimens during the acute phase of infection. The lowest  concentration of SARS-CoV-2 viral copies this assay can detect is 250  copies / mL. A negative result does not preclude SARS-CoV-2 infection  and should not be used as the sole basis for treatment or other  patient management decisions.  A negative result may occur with  improper  specimen collection / handling, submission of specimen other  than nasopharyngeal swab, presence of viral mutation(s) within the  areas targeted by this assay, and inadequate number of viral copies  (<250 copies / mL). A negative result must be  combined with clinical  observations, patient history, and epidemiological information. If result is POSITIVE SARS-CoV-2 target nucleic acids are DETECTED. The SA RS-CoV-2 RNA is generally detectable in upper and lower  respiratory specimens during the acute phase of infection.  Positive  results are indicative of active infection with SARS-CoV-2.  Clinical  correlation with patient history and other diagnostic information is  necessary to determine patient infection status.  Positive results do  not rule out bacterial infection or co-infection with other viruses. If result is PRESUMPTIVE POSTIVE SARS-CoV-2 nucleic acids MAY BE PRESENT.   A presumptive positive result was obtained on the submitted specimen  and confirmed on repeat testing.  While 2019 novel coronavirus  (SARS-CoV-2) nucleic acids may be present in the submitted sample  additional confirmatory testing may be necessary for epidemiological  and / or clinical management purposes  to differentiate between  SARS-CoV-2 and other Sarbecovirus currently known to infect humans.  If clinically indicated additional testing with an alternate test  methodology [phone]) is advi sed. The SARS-CoV-2 RNA is generally  detectable in upper and lower respiratory specimens during the acute  phase of infection. The expected result is Negative. Fact Sheet for Patients:  StrictlyIdeas.no Fact Sheet for Healthcare Providers: BankingDealers.co.za This test is not yet approved or cleared by the [place] FDA and has been authorized for detection and/or diagnosis of SARS-CoV-2 by FDA under an Emergency Use Authorization (EUA).  This EUA will remain in  effect (meaning this test can be used) for the duration of the COVID-19 declaration under Section 564(b)(1) of the Act, 21 U.S.C. section 360bbb-3(b)(1), unless the authorization is terminated or revoked sooner. Performed at [facility], [place] [address], [place], [place] [address]   Urine culture     Status: Abnormal   Collection Time: 03/18/19 12:46 AM  Result Value Ref Range Status   Specimen Description URINE, CLEAN CATCH  Final   Special Requests NONE  Final   Culture (A)  Final    <10,000 COLONIES/mL INSIGNIFICANT GROWTH Performed at [facility], [address] [place], [place] [address]    Report Status 03/19/2019 FINAL  Final  Culture, blood (routine x 2) Call MD if unable to obtain prior to antibiotics being given     Status: None (Preliminary result)   Collection Time: 03/18/19  4:00 PM  Result Value Ref Range Status   Specimen Description   Final    BLOOD RIGHT ARM Performed at [place] [address] [place], [place] [address]    Special Requests   Final    BOTTLES DRAWN AEROBIC ONLY Blood Culture results may not be optimal due to an inadequate volume of blood received in culture bottles   Culture   Final    NO GROWTH 2 DAYS Performed at [facility], [place] [address], [place], [place] [address]    Report Status PENDING  Incomplete  Culture, blood (routine x 2) Call MD if unable to obtain prior to antibiotics being given     Status: None (Preliminary result)   Collection Time: 03/18/19  4:00 PM  Result Value Ref Range Status   Specimen Description   Final    BLOOD RIGHT HAND Performed at [place] [address] [place],  [address]    Special Requests   Final    BOTTLES DRAWN AEROBIC ONLY Blood Culture results may not be optimal due to an inadequate volume of blood received in culture bottles  Culture   Final    NO GROWTH 2 DAYS Performed at [facility], [place] [address]  [place], [place] [address]    Report Status PENDING  Incomplete  MRSA PCR Screening     Status: None   Collection Time: 03/19/19  2:00 PM  Result Value Ref Range Status   MRSA by PCR NEGATIVE NEGATIVE Final    Comment:        The GeneXpert MRSA Assay (FDA approved for NASAL specimens only), is one component of a comprehensive MRSA colonization surveillance program. It is not intended to diagnose MRSA infection nor to guide or monitor treatment for MRSA infections. Performed at [facility], [place] [address], [place], [place] [address]      Scheduled Meds: . heparin  5,000 Units Subcutaneous Q8H  . insulin aspart  0-5 Units Subcutaneous QHS  . insulin aspart  0-9 Units Subcutaneous TID WC  . insulin aspart  3 Units Subcutaneous TID WC  . insulin detemir  4 Units Subcutaneous BID  . methylPREDNISolone (SOLU-MEDROL) injection  40 mg Intravenous Q8H  . midodrine  5 mg Oral TID WC  . vitamin C  500 mg Oral BID  . zinc sulfate  220 mg Oral Daily     LOS: 3 days   Cherene Altes, MD [facility] Office  [phone] Pager - Text Page per Amion  If 7PM-7AM, please contact night-coverage per Amion 03/21/2019, 8:44 AM

## 2019-03-21 NOTE — Progress Notes (Signed)
Updated daughter, Georgiana Shore, on pt. All questions/ concerns answered at this time.

## 2019-03-22 ENCOUNTER — Inpatient Hospital Stay (HOSPITAL_COMMUNITY): Payer: Medicare Other

## 2019-03-22 LAB — POCT I-STAT 7, (LYTES, BLD GAS, ICA,H+H)
Acid-base deficit: 8 mmol/L — ABNORMAL HIGH (ref 0.0–2.0)
Bicarbonate: 20 mmol/L (ref 20.0–28.0)
Calcium, Ion: 1.37 mmol/L (ref 1.15–1.40)
HCT: 36 % — ABNORMAL LOW (ref 39.0–52.0)
Hemoglobin: 12.2 g/dL — ABNORMAL LOW (ref 13.0–17.0)
O2 Saturation: 96 %
Patient temperature: 99
Potassium: 4.7 mmol/L (ref 3.5–5.1)
Sodium: 145 mmol/L (ref 135–145)
TCO2: 22 mmol/L (ref 22–32)
pCO2 arterial: 50.6 mmHg — ABNORMAL HIGH (ref 32.0–48.0)
pH, Arterial: 7.207 — ABNORMAL LOW (ref 7.350–7.450)
pO2, Arterial: 102 mmHg (ref 83.0–108.0)

## 2019-03-22 LAB — GLUCOSE, CAPILLARY
Glucose-Capillary: 132 mg/dL — ABNORMAL HIGH (ref 70–99)
Glucose-Capillary: 147 mg/dL — ABNORMAL HIGH (ref 70–99)
Glucose-Capillary: 159 mg/dL — ABNORMAL HIGH (ref 70–99)
Glucose-Capillary: 161 mg/dL — ABNORMAL HIGH (ref 70–99)
Glucose-Capillary: 162 mg/dL — ABNORMAL HIGH (ref 70–99)
Glucose-Capillary: 224 mg/dL — ABNORMAL HIGH (ref 70–99)

## 2019-03-22 LAB — COMPREHENSIVE METABOLIC PANEL
ALT: 21 U/L (ref 0–44)
AST: 37 U/L (ref 15–41)
Albumin: 2.1 g/dL — ABNORMAL LOW (ref 3.5–5.0)
Alkaline Phosphatase: 73 U/L (ref 38–126)
Anion gap: 6 (ref 5–15)
BUN: 70 mg/dL — ABNORMAL HIGH (ref 8–23)
CO2: 21 mmol/L — ABNORMAL LOW (ref 22–32)
Calcium: 8.7 mg/dL — ABNORMAL LOW (ref 8.9–10.3)
Chloride: 115 mmol/L — ABNORMAL HIGH (ref 98–111)
Creatinine, Ser: 2.5 mg/dL — ABNORMAL HIGH (ref 0.61–1.24)
GFR calc Af Amer: 29 mL/min — ABNORMAL LOW (ref >60)
GFR calc non Af Amer: 25 mL/min — ABNORMAL LOW (ref >60)
Glucose, Bld: 155 mg/dL — ABNORMAL HIGH (ref 70–99)
Potassium: 4.6 mmol/L (ref 3.5–5.1)
Sodium: 142 mmol/L (ref 135–145)
Total Bilirubin: 0.5 mg/dL (ref 0.3–1.2)
Total Protein: 5.8 g/dL — ABNORMAL LOW (ref 6.5–8.1)

## 2019-03-22 LAB — CBC WITH DIFFERENTIAL/PLATELET
Abs Immature Granulocytes: 0.09 10*3/uL — ABNORMAL HIGH (ref 0.00–0.07)
Basophils Absolute: 0 10*3/uL (ref 0.0–0.1)
Basophils Relative: 0 %
Eosinophils Absolute: 0 10*3/uL (ref 0.0–0.5)
Eosinophils Relative: 0 %
HCT: 30.5 % — ABNORMAL LOW (ref 39.0–52.0)
Hemoglobin: 9.7 g/dL — ABNORMAL LOW (ref 13.0–17.0)
Immature Granulocytes: 1 %
Lymphocytes Relative: 5 %
Lymphs Abs: 0.3 10*3/uL — ABNORMAL LOW (ref 0.7–4.0)
MCH: 29.1 pg (ref 26.0–34.0)
MCHC: 31.8 g/dL (ref 30.0–36.0)
MCV: 91.6 fL (ref 80.0–100.0)
Monocytes Absolute: 0.2 10*3/uL (ref 0.1–1.0)
Monocytes Relative: 2 %
Neutro Abs: 6.4 10*3/uL (ref 1.7–7.7)
Neutrophils Relative %: 92 %
Platelets: 122 10*3/uL — ABNORMAL LOW (ref 150–400)
RBC: 3.33 MIL/uL — ABNORMAL LOW (ref 4.22–5.81)
RDW: 14.6 % (ref 11.5–15.5)
WBC: 7 10*3/uL (ref 4.0–10.5)
nRBC: 0.4 % — ABNORMAL HIGH (ref 0.0–0.2)

## 2019-03-22 LAB — MAGNESIUM: Magnesium: 2.5 mg/dL — ABNORMAL HIGH (ref 1.7–2.4)

## 2019-03-22 LAB — FERRITIN: Ferritin: 60 ng/mL (ref 24–336)

## 2019-03-22 LAB — TROPONIN I: Troponin I: <0.03 ng/mL (ref <0.03)

## 2019-03-22 LAB — LACTATE DEHYDROGENASE: LDH: 381 U/L — ABNORMAL HIGH (ref 98–192)

## 2019-03-22 LAB — CULTURE, BLOOD (ROUTINE X 2)
Culture: NO GROWTH
Special Requests: ADEQUATE

## 2019-03-22 LAB — PHOSPHORUS: Phosphorus: 5.8 mg/dL — ABNORMAL HIGH (ref 2.5–4.6)

## 2019-03-22 LAB — C-REACTIVE PROTEIN: CRP: 8.9 mg/dL — ABNORMAL HIGH (ref <1.0)

## 2019-03-22 LAB — D-DIMER, QUANTITATIVE: D-Dimer, Quant: 2.42 ug/mL-FEU — ABNORMAL HIGH (ref 0.00–0.50)

## 2019-03-22 MED ORDER — MIDODRINE HCL 2.5 MG PO TABS
2.5000 mg | ORAL_TABLET | Freq: Three times a day (TID) | ORAL | Status: DC
  Administered 2019-03-22 (×2): 2.5 mg via ORAL
  Filled 2019-03-22 (×4): qty 1

## 2019-03-22 MED ORDER — MIDODRINE HCL 2.5 MG PO TABS
2.5000 mg | ORAL_TABLET | Freq: Three times a day (TID) | ORAL | Status: DC
  Administered 2019-03-22 – 2019-03-23 (×3): 2.5 mg
  Filled 2019-03-22 (×5): qty 1

## 2019-03-22 MED ORDER — FAMOTIDINE 40 MG/5ML PO SUSR
20.0000 mg | Freq: Every day | ORAL | Status: DC
  Administered 2019-03-22 – 2019-03-28 (×7): 20 mg
  Filled 2019-03-22 (×7): qty 2.5

## 2019-03-22 MED ORDER — SODIUM CHLORIDE 0.9 % IV SOLN
INTRAVENOUS | Status: DC | PRN
  Administered 2019-03-22: 11:00:00 via INTRAVENOUS

## 2019-03-22 MED ORDER — VITAL AF 1.2 CAL PO LIQD
1000.0000 mL | ORAL | Status: DC
  Administered 2019-03-22 – 2019-03-30 (×10): 1000 mL
  Filled 2019-03-22 (×5): qty 1000

## 2019-03-22 MED ORDER — SODIUM CHLORIDE 0.9 % IV SOLN
INTRAVENOUS | Status: DC | PRN
  Administered 2019-03-22: 04:00:00 via INTRAVENOUS

## 2019-03-22 MED ORDER — VITAL HIGH PROTEIN PO LIQD: 1000.0000 mL | ORAL | Status: DC

## 2019-03-22 MED ORDER — ACETAMINOPHEN 160 MG/5ML PO SOLN: 650.0000 mg | Freq: Four times a day (QID) | ORAL | Status: DC | PRN

## 2019-03-22 MED ORDER — HEPARIN SODIUM (PORCINE) 10000 UNIT/ML IJ SOLN
7500.0000 [IU] | Freq: Three times a day (TID) | INTRAMUSCULAR | Status: DC
  Administered 2019-03-22 – 2019-03-23 (×3): 7500 [IU] via SUBCUTANEOUS
  Filled 2019-03-22 (×3): qty 1

## 2019-03-22 MED ORDER — DEXTROSE-NACL 5-0.9 % IV SOLN: INTRAVENOUS | Status: DC

## 2019-03-22 NOTE — Progress Notes (Signed)
Brian Owen, MRN:  [id number], DOB:  1951/08/02, LOS: 4 ADMISSION DATE:  03/05/2019, CONSULTATION DATE:  03/21/2019 REFERRING MD:  Dr Thereasa Solo, CHIEF COMPLAINT:  Acute Respiratory Failure   Brief History     History of present illness   68 year old [place] man with hypertension, diabetes, CKD stage III, autonomic dysfunction with orthostatic hypotension.  Admitted with weakness and hypotension, tachypnea 5/19.  COVID-19 positive with bilateral pulmonary infiltrates.  New acute on chronic renal insufficiency.  Received Actemra x2, corticosteroids 5/21 through 5/23.  Experienced significant decline in his oxygenation and mental status beginning late 5/22, difficulty tolerating O2 mask, prone positioning. Moved to the ICU 5/23 with progressive hypoxic respiratory failure requiring intubation for mechanical ventilation.  Past Medical History   has a past medical history of Bullae (11/11/2015), Chronic kidney disease, Heavy cigarette smoker, Hypertension, Parasite infection (2016), and Uncontrolled type II diabetes mellitus ([place]) (dx'd 2013).   Significant Hospital Events   Intubated 5/23  Consults:    Procedures:  ET tube 5/23 >>  Right IJ CVC 5/23 >>   Significant Diagnostic Tests:    Micro Data:  SARS CoV2 5/19 >> positive Urine 5/20 >> insignificant growth Blood 5/19 >> 1 of 2 MRSE (? Contaminant) Blood 5/20 >>  Resp 5/23 >>   Antimicrobials:  Azithro 5/19 x 1 Ceftriaxone 5/19 x 1 Cefepime 5/20 >>  vanco 5/20 >> 5/22  Interim history/subjective:  Stable on MV, FiO2 0.50, PEEP of 8 Serum creatinine improved to 2.5 Ferritin decreased 571 >> 60  Objective   Blood pressure 117/64, pulse 74, temperature 98.5 F (36.9 C), temperature source Axillary, resp. rate (!) 28, height _0  (1.549 m), weight 58.3 kg, SpO2 93 %. CVP:  [1 mmHg-12 mmHg] 3 mmHg  Vent Mode: CPAP FiO2 (%):  [50 %-70 %] 50 % Set Rate:  [28 bmp] 28 bmp Vt Set:  [280 mL-310 mL] 310 mL PEEP:  [8  cmH20-10 cmH20] 8 cmH20   Intake/Output Summary (Last 24 hours) at 03/22/2019 1425 Last data filed at 03/22/2019 1100 Gross per 24 hour  Intake 706.78 ml  Output 1345 ml  Net -638.22 ml   Filed Weights   03/18/19 0423  Weight: 58.3 kg    Examination: General: Cachectic man, ill-appearing, tachypneic, intubated HENT: ET tube in place, edentulous Lungs: Coarse bilaterally, no wheeze, scattered crackles Cardiovascular: Regular, distant, no murmur Abdomen: Soft, nondistended, positive bowel sounds Extremities: No edema Neuro: Sedated, grimace with pain and stimulation, spontaneously moved bilateral upper extremities  Chest x-ray personally reviewed, shows bilateral interstitial infiltrates left greater than right  Resolved Hospital Problem list     Assessment & Plan:  Acute hypoxic respiratory failure/ARDS due to COVID-19 Continue PRVC 6 cc/kg, ARDS protocol.  Titrate PEEP and FiO2 Believe we can defer paralysis, prone positioning at this time Await respiratory culture data, consider DC cefepime based on results Received Actemra x2 Corticosteroids completed Follow inflammatory markers, CRP, ferritin, d-dimer, IL-6 No antivirals due to renal failure  History of autonomic dysfunction and chronic hypotension, at risk for septic shock Restart Midodrin CVC placed, follow CVP.  Goal 5-8 given ARDS  Acute on chronic renal failure (baseline S Cr 2.0), improving Follow BMP, urine output Ensure adequate renal perfusion Avoid nephrotoxins  MRSE 1 of 2 blood cultures, likely contaminant Defer treatment at this time.  Could reconsider vancomycin if he clinically declined  Toxic metabolic encephalopathy Need for sedation to tolerate mechanical ventilation Fentanyl drip, Versed intermittently. RASS -3  DM2 Sliding scale insulin  per protocol [place]   Best practice:  Diet: N.p.o. Pain/Anxiety/Delirium protocol (if indicated): Fentanyl drip, intermittent Versed VAP protocol (if  indicated): Ordered DVT prophylaxis: Heparin subcutaneous GI prophylaxis: Pepcid Glucose control: SSI, Levimir Mobility: BR Code Status: Full Family Communication: Discussed patient's status, current support with daughter by phone 5/24.  I also broach the subject of possible change in CODE STATUS, deferring CPR if he were to arrest.  At this time she believes that her mother is not ready to change CODE STATUS.  I asked them to begin to contemplate this so that we can revisit going forward. Disposition: ICU  Labs   CBC: Recent Labs  Lab 03/08/2019 2139  03/19/19 0516 03/20/19 0525 03/21/19 0419 03/21/19 2029 03/22/19 0341 03/22/19 0433  WBC 7.4  --  7.5 4.6 6.4  --  7.0  --   NEUTROABS 5.7  --  6.8 4.2 5.8  --  6.4  --   HGB 10.5*   < > 10.8* 11.2* 10.9* 9.5* 9.7* 12.2*  HCT 32.2*   < > 33.5* 34.4* 33.2* 28.0* 30.5* 36.0*  MCV 89.2  --  90.5 89.6 88.1  --  91.6  --   PLT 127*  --  149* 141* 156  --  122*  --    < > = values in this interval not displayed.    Basic Metabolic Panel: Recent Labs  Lab 03/18/2019 2139  03/18/19 0118  03/19/19 0516 03/20/19 0525 03/21/19 2029 03/22/19 0341 03/22/19 0433  NA 135   < > 134*   < > 141 138 143 142 145  K 4.5   < > 4.4   < > 4.8 4.5 4.5 4.6 4.7  CL 102  --  105  --  114* 111  --  115*  --   CO2 18*  --  16*  --  18* 18*  --  21*  --   GLUCOSE 436*  --  414*  --  163* 296*  --  155*  --   BUN 41*  --  42*  --  54* 59*  --  70*  --   CREATININE 3.36*  --  3.00*  --  3.18* 2.61*  --  2.50*  --   CALCIUM 8.6*  --  7.9*  --  8.3* 8.8*  --  8.7*  --    < > = values in this interval not displayed.   GFR: Estimated Creatinine Clearance: 20.9 mL/min (A) (by C-G formula based on SCr of 2.5 mg/dL (H)). Recent Labs  Lab 02/28/2019 2155 03/19/2019 2355 03/18/19 0118 03/18/19 1600 03/19/19 0516 03/20/19 0525 03/21/19 0419 03/22/19 0341  PROCALCITON  --   --  88.27  --   --   --   --   --   WBC  --   --   --   --  7.5 4.6 6.4 7.0   LATICACIDVEN 3.2* 1.5  --  2.1*  --   --   --   --     Liver Function Tests: Recent Labs  Lab 03/11/2019 2139 03/21/19 0419 03/22/19 0341  AST 50* 47* 37  ALT _0 ALKPHOS 59 106 73  BILITOT 0.7 0.8 0.5  PROT 6.8 6.0* 5.8*  ALBUMIN 2.6* 2.2* 2.1*   Recent Labs  Lab 03/16/2019 2139  LIPASE 24   No results for input(s): AMMONIA in the last 168 hours.  ABG    Component Value Date/Time   PHART 7.207 (L) 03/22/2019  0433   PCO2ART 50.6 (H) 03/22/2019 0433   PO2ART 102.0 03/22/2019 0433   HCO3 20.0 03/22/2019 0433   TCO2 22 03/22/2019 0433   ACIDBASEDEF 8.0 (H) 03/22/2019 0433   O2SAT 96.0 03/22/2019 0433     Coagulation Profile: No results for input(s): INR, PROTIME in the last 168 hours.  Cardiac Enzymes: Recent Labs  Lab 03/18/19 0118 03/22/19 0342  TROPONINI 0.04* <0.03    HbA1C: Hgb A1c MFr Bld  Date/Time Value Ref Range Status  03/18/2019 10:45 AM 14.9 (H) 4.8 - 5.6 % Final    Comment:    (NOTE) Pre diabetes:          5.7%-6.4% Diabetes:              >6.4% Glycemic control for   <7.0% adults with diabetes   03/18/2019 01:18 AM 14.9 (H) 4.8 - 5.6 % Final    Comment:    (NOTE) Pre diabetes:          5.7%-6.4% Diabetes:              >6.4% Glycemic control for   <7.0% adults with diabetes     CBG: Recent Labs  Lab 03/21/19 1957 03/22/19 0003 03/22/19 0334 03/22/19 0734 03/22/19 1138  GLUCAP 174* 159* 132* 147* 161*    Review of Systems:   Unable to obtain  Past Medical History  He,  has a past medical history of Bullae (11/11/2015), Chronic kidney disease, Heavy cigarette smoker, Hypertension, Parasite infection (2016), and Uncontrolled type II diabetes mellitus ([place]) (dx'd 2013).   Surgical History    Past Surgical History:  Procedure Laterality Date  . NO PAST SURGERIES       Social History   reports that he quit smoking about 5 years ago. His smoking use included cigarettes. He has a 72.00 pack-year smoking history. He has  never used smokeless tobacco. He reports that he does not drink alcohol or use drugs.   Family History   His family history includes Diabetes in some other family members.   Allergies No Known Allergies   Home Medications  Prior to Admission medications   Medication Sig Start Date End Date Taking? Authorizing Provider  ACCU-CHEK SOFTCLIX LANCETS lancets 1 each by Other route 3 (three) times daily. 08/13/18   Hayden Rasmussen, MD  acetaminophen (TYLENOL) 500 MG tablet Take 500 mg by mouth 2 (two) times daily.    [provider]  Blood Glucose Monitoring Suppl (ACCU-CHEK AVIVA PLUS) w/Device KIT 1 Device by Does not apply route 3 (three) times daily after meals. 08/13/18   Hayden Rasmussen, MD  glucose blood (ACCU-CHEK AVIVA PLUS) test strip 1 each by Other route 3 (three) times daily. 03/13/19   Duffy Bruce, MD  insulin glargine (LANTUS) 100 UNIT/ML injection Inject 0.09 mLs (9 Units total) into the skin at bedtime. 03/13/19   Duffy Bruce, MD     Critical care time: 56 min     Baltazar Apo, MD, PhD 03/22/2019, 2:25 PM [facility] [phone] or if no answer [phone]

## 2019-03-22 NOTE — Progress Notes (Signed)
Initial Nutrition Assessment  RD working remotely.   DOCUMENTATION CODES:   Not applicable  INTERVENTION:  - will order TF: Vital AF 1.2 @ 50 ml/hr which will provides 1440 kcal, 90 grams of protein, and 973 ml free water. - free water flush, if desired, to be per MD discretion.    NUTRITION DIAGNOSIS:   Inadequate oral intake related to inability to eat as evidenced by NPO status.  GOAL:   Patient will meet greater than or equal to 90% of their needs  MONITOR:   Vent status, TF tolerance, Labs, Weight trends  REASON FOR ASSESSMENT:   Ventilator, Consult Enteral/tube feeding initiation and management  ASSESSMENT:   68 year old male with HTN, DM, CKD stage 3, and autonomic dysfunction with orthostatic hypotension.  He was admitted with weakness and hypotension, tachypnea 5/19.  COVID-19 positive with bilateral pulmonary infiltrates.  New acute on chronic renal insufficiency.  Received Actemra x2, corticosteroids 5/21-5/23.  Experienced significant decline in his oxygenation and mental status beginning late 5/22, difficulty tolerating O2 mask, prone positioning. Moved to the ICU 5/23 with progressive hypoxic respiratory failure requiring intubation for mechanical ventilation.  Patient was intubated and OGT placed yesterday afternoon. Of note, patient needs an interpreter as his primary language is [place]. Prior to intubation he was on a Regular diet 5/20-5/22 and then on Carb Modified starting the evening of 5/22. Per RN flow sheet, he consumed 50% of breakfast and 30% of dinner on 5/21; 75% of breakfast and 50% of lunch on 5/22.   Per chart review, current weight is 128 lb which is significantly up from weight as of 11/13/18 (112 lb). Will continue to monitor weight trends.  Per notes: patient is currently in supine positioning but proning and paralytics may be utilized dependent on respiratory status, ARDS, acute on chronic renal failure--improving, toxic metabolic  encephalopathy.  Patient is currently intubated on ventilator support MV: 8.9 L/min Temp (24hrs), Avg:97.7 F (36.5 C), Min:96 F (35.6 C), Max:99 F (37.2 C) Propofol: none     Medications reviewed; 20 mg pepcid per OGT/day, sliding scale novolog, 8 units levemir BID, 40 mg solu-medrol x2 doses 5/23, 466 mg actemra x1 dose 5/23.  Labs reviewed; CBGs: 159, 132, 147, and 161 mg/dl today, Cl: 115 mmol/l, BUN: 70 mg/dl, creatinine: 2.5 mg/dl, Ca: 8.7 mg/dl, GFR: 25 ml/min.    NUTRITION - FOCUSED PHYSICAL EXAM:  unable to perform while patient is at [facility].  Diet Order:   Diet Order            Diet NPO time specified  Diet effective now              EDUCATION NEEDS:   No education needs have been identified at this time  Skin:  Skin Assessment: Reviewed RN Assessment  Last BM:  5/23  Height:   Ht Readings from Last 1 Encounters:  03/18/19 5\' 1"  (1.549 m)    Weight:   Wt Readings from Last 1 Encounters:  03/18/19 58.3 kg    Ideal Body Weight:  50.9 kg  BMI:  Body mass index is 24.29 kg/m.  Estimated Nutritional Needs:   Kcal:  1448 kcal  Protein:  87-100 grams  Fluid:  >/= 1.8 L/day     Jarome Matin, MS, RD, LDN, [facility] Inpatient Clinical Dietitian Pager # [phone] After hours/weekend pager # [phone]

## 2019-03-22 NOTE — Progress Notes (Signed)
Attempted to call all numbers in patients chart, one was a wrong number and other two were off hook.

## 2019-03-22 NOTE — Progress Notes (Signed)
[place] TEAM 1 - Stepdown/ICU TEAM  Edwin Dada  SLH:[id number] DOB: Feb 04, 1951 DOA: 03/08/2019 PCP: Alfonse Spruce, FNP    Brief Narrative:  15BW w/ a hx of HTN, DM, CKD Stage 3, and multiple prior admissions for orthostatic hypotension related to autonomic dysfunction (noncompliant w/ midodrine) who presented w/ fever generalized weakness hypotension and tachypnea after not feeling well in general for a week. He was seen on 5/17 for orthostatic hypotension and was given fluids started and midodrine and discharged home.  In the ED he was found to be hyperglycemic and febrile with a creatinine of 3.3 (baseline 2.0) and a CXR noting multifocal interstitial opacities.    Significant Events: 5/19 admit  5/22 saturations declining 5/23 transferred to ICU - intubated - R IJ CVL  COVID-19 specific Treatment: Actemra 5/21 & 5/23 Steroids 5/21 > 5/23  Ventilator Settings: Vent Mode: PRVC FiO2 (%):  [50 %-70 %] 50 % Set Rate:  [28 bmp] 28 bmp Vt Set:  [280 mL-310 mL] 310 mL PEEP:  [8 cmH20-10 cmH20] 8 cmH20  Subjective: Now in ICU on vent. PCCM following. CXR w/o signif change this morning. Appears comfortable on vent and not in acute distress.   Assessment & Plan:  Acute hypoxic respiratory failure -COVID-19 pneumonia -sepsis Has now received 2 doses of actemra   Recent Labs    03/20/19 0525 03/21/19 0419 03/22/19 0341  DDIMER 2.17* 2.38* 2.42*  FERRITIN 479* 571* 60  LDH  --   --  381*  CRP 27.9* 16.6* 8.9*    Autonomic dysfunction /chronic orthostatic hypotension BP marginal but stable for now   Metabolic encephalopathy - acute Now sedated on vent   DM2 -uncontrolled with hyperglycemia CBG controlled at this time   Acute kidney injury on CKD Baseline crt 2.0 - kidney function stable today - follow in serial fashion   Recent Labs  Lab 03/20/2019 2139 03/18/19 0118 03/19/19 0516 03/20/19 0525 03/22/19 0341  CREATININE 3.36* 3.00* 3.18* 2.61* 2.50*     Mildly elevated troponin Denied chest pain when alert - likely related to renal dysfunction and hypotension - recheck today normal   Positive blood culture Clinically most consistent with contamination - antibiotics not indicated for this issue at present  DVT prophylaxis: Lovenox Code Status: FULL CODE Family Communication:  Disposition Plan: ICU  Consultants:  none  Antimicrobials:  Azithromycin 5/19 Ceftriaxone 5/19 Cefepime 5/20 > Vancomycin 5/20 > 5/22  Objective: Blood pressure 106/61, pulse 61, temperature (!) 97.2 F (36.2 C), temperature source Axillary, resp. rate (!) 21, height '5\' 1"'  (1.549 m), weight 58.3 kg, SpO2 94 %.  Intake/Output Summary (Last 24 hours) at 03/22/2019 0852 Last data filed at 03/22/2019 0800 Gross per 24 hour  Intake 581.78 ml  Output 1275 ml  Net -693.22 ml   Filed Weights   03/18/19 0423  Weight: 58.3 kg    Examination: General: comfortable on vent Lungs: fine crackles diffusely  Cardiovascular: RRR - no M or rub  Abdomen: Nondistended, soft, bowel sounds hypoactive, no ascites Extremities: No C/C/E B LE   CBC: Recent Labs  Lab 03/20/19 0525 03/21/19 0419 03/21/19 2029 03/22/19 0341 03/22/19 0433  WBC 4.6 6.4  --  7.0  --   NEUTROABS 4.2 5.8  --  6.4  --   HGB 11.2* 10.9* 9.5* 9.7* 12.2*  HCT 34.4* 33.2* 28.0* 30.5* 36.0*  MCV 89.6 88.1  --  91.6  --   PLT 141* 156  --  122*  --  Basic Metabolic Panel: Recent Labs  Lab 03/19/19 0516 03/20/19 0525 03/21/19 2029 03/22/19 0341 03/22/19 0433  NA 141 138 143 142 145  K 4.8 4.5 4.5 4.6 4.7  CL 114* 111  --  115*  --   CO2 18* 18*  --  21*  --   GLUCOSE 163* 296*  --  155*  --   BUN 54* 59*  --  70*  --   CREATININE 3.18* 2.61*  --  2.50*  --   CALCIUM 8.3* 8.8*  --  8.7*  --    GFR: Estimated Creatinine Clearance: 20.9 mL/min (A) (by C-G formula based on SCr of 2.5 mg/dL (H)).  Liver Function Tests: Recent Labs  Lab 03/14/2019 2139 03/21/19 0419 03/22/19  0341  AST 50* 47* 37  ALT '19 24 21  ' ALKPHOS 59 106 73  BILITOT 0.7 0.8 0.5  PROT 6.8 6.0* 5.8*  ALBUMIN 2.6* 2.2* 2.1*   Recent Labs  Lab 03/06/2019 2139  LIPASE 24    Cardiac Enzymes: Recent Labs  Lab 03/18/19 0118 03/22/19 0342  TROPONINI 0.04* <0.03    HbA1C: Hgb A1c MFr Bld  Date/Time Value Ref Range Status  03/18/2019 10:45 AM 14.9 (H) 4.8 - 5.6 % Final    Comment:    (NOTE) Pre diabetes:          5.7%-6.4% Diabetes:              >6.4% Glycemic control for   <7.0% adults with diabetes   03/18/2019 01:18 AM 14.9 (H) 4.8 - 5.6 % Final    Comment:    (NOTE) Pre diabetes:          5.7%-6.4% Diabetes:              >6.4% Glycemic control for   <7.0% adults with diabetes     CBG: Recent Labs  Lab 03/21/19 1644 03/21/19 1957 03/22/19 0003 03/22/19 0334 03/22/19 0734  GLUCAP 204* 174* 159* 132* 147*    Recent Results (from the past 240 hour(s))  Blood Culture (routine x 2)     Status: None (Preliminary result)   Collection Time: 03/19/2019  8:24 PM  Result Value Ref Range Status   Specimen Description BLOOD LEFT FOREARM  Final   Special Requests   Final    BOTTLES DRAWN AEROBIC ONLY Blood Culture adequate volume   Culture   Final    NO GROWTH 4 DAYS Performed at [facility], [address] [place], [place] [address]    Report Status PENDING  Incomplete  Blood Culture (routine x 2)     Status: Abnormal   Collection Time: 03/12/2019 10:03 PM  Result Value Ref Range Status   Specimen Description BLOOD RIGHT IV  Final   Special Requests   Final    BOTTLES DRAWN AEROBIC AND ANAEROBIC Blood Culture results may not be optimal due to an excessive volume of blood received in culture bottles   Culture  Setup Time   Final    GRAM POSITIVE COCCI ANAEROBIC BOTTLE ONLY Organism ID to follow CRITICAL RESULT CALLED TO, READ BACK BY AND VERIFIED WITH: Jene Every PharmD 16:05 03/18/19 ([email or web address]) AEROBIC BOTTLE ONLY GRAM POSITIVE RODS CRITICAL RESULT CALLED TO,  READ BACK BY AND VERIFIED WITH: E WILLIAMSON PHARMD 03/18/19 2128 JDW    Culture (A)  Final    STAPHYLOCOCCUS SPECIES (COAGULASE NEGATIVE) DIPHTHEROIDS(CORYNEBACTERIUM SPECIES) THE SIGNIFICANCE OF ISOLATING THIS ORGANISM FROM A SINGLE SET OF BLOOD CULTURES WHEN MULTIPLE SETS ARE  DRAWN IS UNCERTAIN. PLEASE NOTIFY THE MICROBIOLOGY DEPARTMENT WITHIN ONE WEEK IF SPECIATION AND SENSITIVITIES ARE REQUIRED. Performed at [facility], [place] [address] [place], [place] [address]    Report Status 03/19/2019 FINAL  Final  Blood Culture ID Panel (Reflexed)     Status: Abnormal   Collection Time: 03/21/2019 10:03 PM  Result Value Ref Range Status   Enterococcus species NOT DETECTED NOT DETECTED Final   Listeria monocytogenes NOT DETECTED NOT DETECTED Final   Staphylococcus species DETECTED (A) NOT DETECTED Final    Comment: Methicillin (oxacillin) resistant coagulase negative staphylococcus. Possible blood culture contaminant (unless isolated from more than one blood culture draw or clinical case suggests pathogenicity). No antibiotic treatment is indicated for blood  culture contaminants. CRITICAL RESULT CALLED TO, READ BACK BY AND VERIFIED WITH: Jene Every PharmD 16:05 03/18/19 ([email or web address])    Staphylococcus aureus (BCID) NOT DETECTED NOT DETECTED Final   Methicillin resistance DETECTED (A) NOT DETECTED Final    Comment: CRITICAL RESULT CALLED TO, READ BACK BY AND VERIFIED WITH: Jene Every PharmD 16:05 03/18/19 ([email or web address])    Streptococcus species NOT DETECTED NOT DETECTED Final   Streptococcus agalactiae NOT DETECTED NOT DETECTED Final   Streptococcus pneumoniae NOT DETECTED NOT DETECTED Final   Streptococcus pyogenes NOT DETECTED NOT DETECTED Final   Acinetobacter baumannii NOT DETECTED NOT DETECTED Final   Enterobacteriaceae species NOT DETECTED NOT DETECTED Final   Enterobacter cloacae complex NOT DETECTED NOT DETECTED Final   Escherichia coli NOT DETECTED NOT DETECTED Final   Klebsiella oxytoca NOT  DETECTED NOT DETECTED Final   Klebsiella pneumoniae NOT DETECTED NOT DETECTED Final   Proteus species NOT DETECTED NOT DETECTED Final   Serratia marcescens NOT DETECTED NOT DETECTED Final   Haemophilus influenzae NOT DETECTED NOT DETECTED Final   Neisseria meningitidis NOT DETECTED NOT DETECTED Final   Pseudomonas aeruginosa NOT DETECTED NOT DETECTED Final   Candida albicans NOT DETECTED NOT DETECTED Final   Candida glabrata NOT DETECTED NOT DETECTED Final   Candida krusei NOT DETECTED NOT DETECTED Final   Candida parapsilosis NOT DETECTED NOT DETECTED Final   Candida tropicalis NOT DETECTED NOT DETECTED Final    Comment: Performed at [facility], [place]. [address] [place], [place] [address]  SARS Coronavirus 2 (CEPHEID- Performed in [facility] lab), Hosp Order     Status: Abnormal   Collection Time: 03/27/2019 11:52 PM  Result Value Ref Range Status   SARS Coronavirus 2 POSITIVE (A) NEGATIVE Final    Comment: RESULT CALLED TO, READ BACK BY AND VERIFIED WITH: P JOHNSTON RN 03/17/18 0131 JDW (NOTE) If result is NEGATIVE SARS-CoV-2 target nucleic acids are NOT DETECTED. The SARS-CoV-2 RNA is generally detectable in upper and lower  respiratory specimens during the acute phase of infection. The lowest  concentration of SARS-CoV-2 viral copies this assay can detect is 250  copies / mL. A negative result does not preclude SARS-CoV-2 infection  and should not be used as the sole basis for treatment or other  patient management decisions.  A negative result may occur with  improper specimen collection / handling, submission of specimen other  than nasopharyngeal swab, presence of viral mutation(s) within the  areas targeted by this assay, and inadequate number of viral copies  (<250 copies / mL). A negative result must be combined with clinical  observations, patient history, and epidemiological information. If result is POSITIVE SARS-CoV-2 target nucleic acids are DETECTED.  The SA RS-CoV-2 RNA is generally detectable in upper and  lower  respiratory specimens during the acute phase of infection.  Positive  results are indicative of active infection with SARS-CoV-2.  Clinical  correlation with patient history and other diagnostic information is  necessary to determine patient infection status.  Positive results do  not rule out bacterial infection or co-infection with other viruses. If result is PRESUMPTIVE POSTIVE SARS-CoV-2 nucleic acids MAY BE PRESENT.   A presumptive positive result was obtained on the submitted specimen  and confirmed on repeat testing.  While 2019 novel coronavirus  (SARS-CoV-2) nucleic acids may be present in the submitted sample  additional confirmatory testing may be necessary for epidemiological  and / or clinical management purposes  to differentiate between  SARS-CoV-2 and other Sarbecovirus currently known to infect humans.  If clinically indicated additional testing with an alternate test  methodology [phone]) is advi sed. The SARS-CoV-2 RNA is generally  detectable in upper and lower respiratory specimens during the acute  phase of infection. The expected result is Negative. Fact Sheet for Patients:  StrictlyIdeas.no Fact Sheet for Healthcare Providers: BankingDealers.co.za This test is not yet approved or cleared by the [place] FDA and has been authorized for detection and/or diagnosis of SARS-CoV-2 by FDA under an Emergency Use Authorization (EUA).  This EUA will remain in effect (meaning this test can be used) for the duration of the COVID-19 declaration under Section 564(b)(1) of the Act, 21 U.S.C. section 360bbb-3(b)(1), unless the authorization is terminated or revoked sooner. Performed at [facility], [place] [address] [place], [place] [address]   Urine culture     Status: Abnormal   Collection Time: 03/18/19 12:46 AM  Result Value Ref Range Status    Specimen Description URINE, CLEAN CATCH  Final   Special Requests NONE  Final   Culture (A)  Final    <10,000 COLONIES/mL INSIGNIFICANT GROWTH Performed at [facility], [address] [place], [place] [address]    Report Status 03/19/2019 FINAL  Final  Culture, blood (routine x 2) Call MD if unable to obtain prior to antibiotics being given     Status: None (Preliminary result)   Collection Time: 03/18/19  4:00 PM  Result Value Ref Range Status   Specimen Description   Final    BLOOD RIGHT ARM Performed at [facility] [address], [place], [place] [address]    Special Requests   Final    BOTTLES DRAWN AEROBIC ONLY Blood Culture results may not be optimal due to an inadequate volume of blood received in culture bottles   Culture   Final    NO GROWTH 3 DAYS Performed at [facility], [place] [address], [place], [place] [address]    Report Status PENDING  Incomplete  Culture, blood (routine x 2) Call MD if unable to obtain prior to antibiotics being given     Status: None (Preliminary result)   Collection Time: 03/18/19  4:00 PM  Result Value Ref Range Status   Specimen Description   Final    BLOOD RIGHT HAND Performed at [facility] [address] [place], [place] [address]    Special Requests   Final    BOTTLES DRAWN AEROBIC ONLY Blood Culture results may not be optimal due to an inadequate volume of blood received in culture bottles   Culture   Final    NO GROWTH 3 DAYS Performed at [facility], [place] [address] [place], [place] [address]    Report  Status PENDING  Incomplete  MRSA PCR Screening     Status: None   Collection Time: 03/19/19  2:00 PM  Result Value Ref Range Status   MRSA by PCR NEGATIVE NEGATIVE Final    Comment:        The GeneXpert MRSA Assay (FDA approved for NASAL specimens only), is one component of a comprehensive MRSA colonization surveillance program. It is not intended to diagnose MRSA  infection nor to guide or monitor treatment for MRSA infections. Performed at [facility], [place] [address], [place], [place] [address]   Culture, respiratory (non-expectorated)     Status: None (Preliminary result)   Collection Time: 03/21/19  2:42 PM  Result Value Ref Range Status   Specimen Description   Final    TRACHEAL ASPIRATE Performed at [facility] [address] [place], [place] [address]    Special Requests   Final    NONE Performed at [facility], [place] [address] [place], [place] [address]    Gram Stain   Final    RARE WBC PRESENT,BOTH PMN AND MONONUCLEAR RARE YEAST Performed at [facility], [place] [address] [place], [place] [address]    Culture PENDING  Incomplete   Report Status PENDING  Incomplete     Scheduled Meds: . chlorhexidine gluconate (MEDLINE KIT)  15 mL Mouth Rinse BID  . heparin  5,000 Units Subcutaneous Q8H  . insulin aspart  0-9 Units Subcutaneous Q4H  . insulin detemir  8 Units Subcutaneous BID  . mouth rinse  15 mL Mouth Rinse 10 times per day  . midodrine  2.5 mg Oral TID WC  . pneumococcal 23 valent vaccine  0.5 mL Intramuscular Tomorrow-1000     LOS: 4 days   Cherene Altes, MD [facility] Office  [phone] Pager - Text Page per Amion  If 7PM-7AM, please contact night-coverage per Amion 03/22/2019, 8:52 AM

## 2019-03-22 NOTE — Progress Notes (Signed)
Notified Dr Lamonte Sakai that pt had neuro change on 1600 assessment.  Pupils were 3's and brisk earlier today, now 5's fixed and dilated, very sluggishly reactive.  Pt able to look at me and track, no eye movement now.  Startles to touch and not following commands.  Also notified MD of pts low urine output, approx 10-15 mL/hr.  May be due to pts chronic kidney disease and opioid tolerance.  MD suggested to wean sedation.  Okay with current UOP.

## 2019-03-22 NOTE — Progress Notes (Signed)
Pharmacy Antibiotic Note  Brian Owen is a 68 y.o. male admitted on 03/16/2019 with pneumonia.  Pharmacy has been consulted for Cefepime dosing.  Plan:  Continue cefepime 2g IV q24  Complete 8 days per MD, stop date in place  Height: 5\' 1"  (154.9 cm) Weight: 128 lb 8.5 oz (58.3 kg) IBW/kg (Calculated) : 52.3  Temp (24hrs), Avg:97.6 F (36.4 C), Min:96 F (35.6 C), Max:99 F (37.2 C)  Recent Labs  Lab 03/18/2019 2139 03/01/2019 2155 03/13/2019 2355 03/18/19 0118 03/18/19 1600 03/19/19 0516 03/20/19 0525 03/21/19 0419 03/22/19 0341  WBC 7.4  --   --   --   --  7.5 4.6 6.4 7.0  CREATININE 3.36*  --   --  3.00*  --  3.18* 2.61*  --  2.50*  LATICACIDVEN  --  3.2* 1.5  --  2.1*  --   --   --   --     Estimated Creatinine Clearance: 20.9 mL/min (A) (by C-G formula based on SCr of 2.5 mg/dL (H)).    No Known Allergies  Antimicrobials this admission: 5/19 Azithromycin x1 5/19 Ceftriaxone x1 5/20 Vitamin C  >> 5/23 5/20 zinc sulfate >> 5/23 5/20 Vancomycin >> 5/22 5/20 Cefepime >> 5/27 5/21 Actemra x1 5/23 Actemra x1  Dose adjustments this admission:  Microbiology results: 5/19 SARS Coronavirus 2: positive 5/19 BCx: 1/4 bottles CNS, 1/4 bottles Diphtheroids (no BCID done) 5/20 UCx: <10k 5/20 MRSA PCR: neg 5/20 blood:  ngtd 5/20 Strep pneumo Ur Ag: positive 5/23 TA: pending   Gretta Arab PharmD, BCPS 03/22/2019 9:25 AM

## 2019-03-23 ENCOUNTER — Inpatient Hospital Stay (HOSPITAL_COMMUNITY): Payer: Medicare Other

## 2019-03-23 DIAGNOSIS — A419 Sepsis, unspecified organism: Secondary | ICD-10-CM

## 2019-03-23 DIAGNOSIS — Z794 Long term (current) use of insulin: Secondary | ICD-10-CM

## 2019-03-23 DIAGNOSIS — R6520 Severe sepsis without septic shock: Secondary | ICD-10-CM

## 2019-03-23 DIAGNOSIS — E1165 Type 2 diabetes mellitus with hyperglycemia: Secondary | ICD-10-CM

## 2019-03-23 LAB — CBC WITH DIFFERENTIAL/PLATELET
Abs Immature Granulocytes: 0.08 10*3/uL — ABNORMAL HIGH (ref 0.00–0.07)
Basophils Absolute: 0 10*3/uL (ref 0.0–0.1)
Basophils Relative: 0 %
Eosinophils Absolute: 0 10*3/uL (ref 0.0–0.5)
Eosinophils Relative: 0 %
HCT: 30.1 % — ABNORMAL LOW (ref 39.0–52.0)
Hemoglobin: 9.2 g/dL — ABNORMAL LOW (ref 13.0–17.0)
Immature Granulocytes: 1 %
Lymphocytes Relative: 8 %
Lymphs Abs: 0.7 10*3/uL (ref 0.7–4.0)
MCH: 29.4 pg (ref 26.0–34.0)
MCHC: 30.6 g/dL (ref 30.0–36.0)
MCV: 96.2 fL (ref 80.0–100.0)
Monocytes Absolute: 0.1 10*3/uL (ref 0.1–1.0)
Monocytes Relative: 1 %
Neutro Abs: 7.4 10*3/uL (ref 1.7–7.7)
Neutrophils Relative %: 90 %
Platelets: 89 10*3/uL — ABNORMAL LOW (ref 150–400)
RBC: 3.13 MIL/uL — ABNORMAL LOW (ref 4.22–5.81)
RDW: 15.4 % (ref 11.5–15.5)
WBC: 8.3 10*3/uL (ref 4.0–10.5)
nRBC: 0.7 % — ABNORMAL HIGH (ref 0.0–0.2)

## 2019-03-23 LAB — D-DIMER, QUANTITATIVE: D-Dimer, Quant: 2.68 ug/mL-FEU — ABNORMAL HIGH (ref 0.00–0.50)

## 2019-03-23 LAB — CULTURE, BLOOD (ROUTINE X 2)
Culture: NO GROWTH
Culture: NO GROWTH

## 2019-03-23 LAB — COMPREHENSIVE METABOLIC PANEL
ALT: 20 U/L (ref 0–44)
AST: 35 U/L (ref 15–41)
Albumin: 2 g/dL — ABNORMAL LOW (ref 3.5–5.0)
Alkaline Phosphatase: 79 U/L (ref 38–126)
Anion gap: 5 (ref 5–15)
BUN: 93 mg/dL — ABNORMAL HIGH (ref 8–23)
CO2: 23 mmol/L (ref 22–32)
Calcium: 8.2 mg/dL — ABNORMAL LOW (ref 8.9–10.3)
Chloride: 116 mmol/L — ABNORMAL HIGH (ref 98–111)
Creatinine, Ser: 4.04 mg/dL — ABNORMAL HIGH (ref 0.61–1.24)
GFR calc Af Amer: 16 mL/min — ABNORMAL LOW (ref >60)
GFR calc non Af Amer: 14 mL/min — ABNORMAL LOW (ref >60)
Glucose, Bld: 278 mg/dL — ABNORMAL HIGH (ref 70–99)
Potassium: 4.6 mmol/L (ref 3.5–5.1)
Sodium: 144 mmol/L (ref 135–145)
Total Bilirubin: 0.3 mg/dL (ref 0.3–1.2)
Total Protein: 5.9 g/dL — ABNORMAL LOW (ref 6.5–8.1)

## 2019-03-23 LAB — GLUCOSE, CAPILLARY
Glucose-Capillary: 156 mg/dL — ABNORMAL HIGH (ref 70–99)
Glucose-Capillary: 207 mg/dL — ABNORMAL HIGH (ref 70–99)
Glucose-Capillary: 215 mg/dL — ABNORMAL HIGH (ref 70–99)
Glucose-Capillary: 240 mg/dL — ABNORMAL HIGH (ref 70–99)
Glucose-Capillary: 243 mg/dL — ABNORMAL HIGH (ref 70–99)
Glucose-Capillary: 259 mg/dL — ABNORMAL HIGH (ref 70–99)
Glucose-Capillary: 270 mg/dL — ABNORMAL HIGH (ref 70–99)

## 2019-03-23 LAB — BASIC METABOLIC PANEL
Anion gap: 5 (ref 5–15)
BUN: 95 mg/dL — ABNORMAL HIGH (ref 8–23)
CO2: 20 mmol/L — ABNORMAL LOW (ref 22–32)
Calcium: 7.9 mg/dL — ABNORMAL LOW (ref 8.9–10.3)
Chloride: 118 mmol/L — ABNORMAL HIGH (ref 98–111)
Creatinine, Ser: 4.09 mg/dL — ABNORMAL HIGH (ref 0.61–1.24)
GFR calc Af Amer: 16 mL/min — ABNORMAL LOW (ref >60)
GFR calc non Af Amer: 14 mL/min — ABNORMAL LOW (ref >60)
Glucose, Bld: 273 mg/dL — ABNORMAL HIGH (ref 70–99)
Potassium: 4.9 mmol/L (ref 3.5–5.1)
Sodium: 143 mmol/L (ref 135–145)

## 2019-03-23 LAB — PHOSPHORUS: Phosphorus: 6.1 mg/dL — ABNORMAL HIGH (ref 2.5–4.6)

## 2019-03-23 LAB — MAGNESIUM: Magnesium: 2.5 mg/dL — ABNORMAL HIGH (ref 1.7–2.4)

## 2019-03-23 LAB — C-REACTIVE PROTEIN: CRP: 6.5 mg/dL — ABNORMAL HIGH (ref <1.0)

## 2019-03-23 LAB — FERRITIN: Ferritin: 364 ng/mL — ABNORMAL HIGH (ref 24–336)

## 2019-03-23 MED ORDER — SODIUM CHLORIDE 0.9 % IV SOLN
1.0000 g | INTRAVENOUS | Status: AC
  Administered 2019-03-24: 1 g via INTRAVENOUS
  Filled 2019-03-23: qty 1

## 2019-03-23 MED ORDER — SODIUM CHLORIDE 0.9 % IV BOLUS
500.0000 mL | Freq: Once | INTRAVENOUS | Status: AC
  Administered 2019-03-23: 500 mL via INTRAVENOUS

## 2019-03-23 MED ORDER — CHLORHEXIDINE GLUCONATE 0.12 % MT SOLN
15.0000 mL | Freq: Two times a day (BID) | OROMUCOSAL | Status: DC
  Administered 2019-03-23 – 2019-03-26 (×7): 15 mL via OROMUCOSAL
  Filled 2019-03-23 (×4): qty 15

## 2019-03-23 MED ORDER — MIDODRINE HCL 5 MG PO TABS
2.5000 mg | ORAL_TABLET | Freq: Three times a day (TID) | ORAL | Status: DC
  Administered 2019-03-23 – 2019-03-26 (×9): 2.5 mg
  Filled 2019-03-23 (×6): qty 0.5
  Filled 2019-03-23: qty 1
  Filled 2019-03-23 (×3): qty 0.5

## 2019-03-23 MED ORDER — LIP MEDEX EX OINT
TOPICAL_OINTMENT | CUTANEOUS | Status: DC | PRN
  Filled 2019-03-23: qty 7

## 2019-03-23 MED ORDER — CHLORHEXIDINE GLUCONATE CLOTH 2 % EX PADS
6.0000 | MEDICATED_PAD | Freq: Every day | CUTANEOUS | Status: DC
  Administered 2019-03-23 – 2019-03-31 (×9): 6 via TOPICAL

## 2019-03-23 NOTE — Progress Notes (Signed)
Spoke with patients daughter and gave her a daily update

## 2019-03-23 NOTE — Progress Notes (Signed)
[facility] TEAM 1 - Stepdown/ICU TEAM  Edwin Dada  TMA:[id number] DOB: Mar 17, 1951 DOA: 03/04/2019 PCP: Alfonse Spruce, FNP    Brief Narrative:  25WL w/ a hx of HTN, DM, CKD Stage 3, and multiple prior admissions for orthostatic hypotension related to autonomic dysfunction (noncompliant w/ midodrine) who presented w/ fever generalized weakness hypotension and tachypnea after not feeling well in general for a week. He was seen on 5/17 for orthostatic hypotension and was given fluids started and midodrine and discharged home.  In the ED he was found to be hyperglycemic and febrile with a creatinine of 3.3 (baseline 2.0) and a CXR noting multifocal interstitial opacities.    Significant Events: 5/19 admit  5/22 saturations declining 5/23 transferred to ICU - intubated - R IJ CVL  COVID-19 specific Treatment: Actemra 5/21 & 5/23 Steroids 5/21 > 5/23  Ventilator Settings: Vent Mode: PRVC FiO2 (%):  [40 %-50 %] 50 % Set Rate:  [28 bmp] 28 bmp Vt Set:  [310 mL] 310 mL PEEP:  [8 cmH20-10 cmH20] 8 cmH20  Subjective: Sedated on vent at time of exam. No evidence of distress or uncontrolled pain. Cooperating w/ vent well. BP marginal in 89H systolic w/ MAP ~73. Midodrine has just been given.   Assessment & Plan:  Acute hypoxic respiratory failure -COVID-19 pneumonia -sepsis Has received 2 doses of actemra - not a candidate for remdisivir due to renal failure - appears stable on vent from a pulmonary standpoint - vent management per PCCM   Recent Labs    03/21/19 0419 03/22/19 0341 03/23/19 0322  DDIMER 2.38* 2.42* 2.68*  FERRITIN 571* 60 364*  LDH  --  381*  --   CRP 16.6* 8.9* 6.5*    Acute kidney injury on CKD Baseline crt 2.0 - kidney function significantly worse today - UOP appears to be dropping - foley to be repositioned and flushed by RN - renal US to be obtained if possible to do at bedside - this is a very poor prognostic sign - consider need for pressors   Recent Labs   Lab 03/19/19 0516 03/20/19 0525 03/22/19 0341 03/23/19 0322 03/23/19 1431  CREATININE 3.18* 2.61* 2.50* 4.04* 4.09*   Autonomic dysfunction /chronic orthostatic hypotension Cont midodrine per tube unless pressors initiated   Metabolic encephalopathy - acute  DM2 -uncontrolled with hyperglycemia CBG controlled at this time   Mildly elevated troponin Denied chest pain when alert - likely related to renal dysfunction and hypotension - recheck today normal   Positive blood culture - Coag neg Staph + Diptheroids (1 of 2 blood cx) Clinically most consistent with contamination - antibiotics not indicated for this issue at present  DVT prophylaxis: Lovenox Code Status: FULL CODE Family Communication:  Disposition Plan: ICU  Consultants:  none  Antimicrobials:  Azithromycin 5/19 Ceftriaxone 5/19 Cefepime 5/20 > Vancomycin 5/20 > 5/22  Objective: Blood pressure (!) 72/50, pulse 71, temperature 97.8 F (36.6 C), temperature source Axillary, resp. rate (!) 28, height 5\' 1"  (1.549 m), weight 61.1 kg, SpO2 93 %.  Intake/Output Summary (Last 24 hours) at 03/23/2019 1659 Last data filed at 03/23/2019 1629 Gross per 24 hour  Intake 3562.24 ml  Output 300 ml  Net 3262.24 ml   Filed Weights   03/18/19 0423 03/23/19 0500  Weight: 58.3 kg 61.1 kg    Examination: General: appears comfortable on vent Lungs: fine crackles diffusely - no wheezing  Cardiovascular: RRR w/o M  Abdomen: Nondistended, soft, BS hypoactive, no ascites Extremities: No C/C/E B  lower extremities   CBC: Recent Labs  Lab 03/21/19 0419  03/22/19 0341 03/22/19 0433 03/23/19 0322  WBC 6.4  --  7.0  --  8.3  NEUTROABS 5.8  --  6.4  --  7.4  HGB 10.9*   < > 9.7* 12.2* 9.2*  HCT 33.2*   < > 30.5* 36.0* 30.1*  MCV 88.1  --  91.6  --  96.2  PLT 156  --  122*  --  89*   < > = values in this interval not displayed.   Basic Metabolic Panel: Recent Labs  Lab 03/22/19 0341 03/22/19 0433 03/22/19 1830  03/23/19 0322 03/23/19 1431  NA 142 145  --  144 143  K 4.6 4.7  --  4.6 4.9  CL 115*  --   --  116* 118*  CO2 21*  --   --  23 20*  GLUCOSE 155*  --   --  278* 273*  BUN 70*  --   --  93* 95*  CREATININE 2.50*  --   --  4.04* 4.09*  CALCIUM 8.7*  --   --  8.2* 7.9*  MG  --   --  2.5* 2.5*  --   PHOS  --   --  5.8* 6.1*  --    GFR: Estimated Creatinine Clearance: 12.8 mL/min (A) (by C-G formula based on SCr of 4.09 mg/dL (H)).  Liver Function Tests: Recent Labs  Lab 03/13/2019 2139 03/21/19 0419 03/22/19 0341 03/23/19 0322  AST 50* 47* 37 35  ALT 19 24 21 20   ALKPHOS 59 106 73 79  BILITOT 0.7 0.8 0.5 0.3  PROT 6.8 6.0* 5.8* 5.9*  ALBUMIN 2.6* 2.2* 2.1* 2.0*   Recent Labs  Lab 03/07/2019 2139  LIPASE 24    Cardiac Enzymes: Recent Labs  Lab 03/18/19 0118 03/22/19 0342  TROPONINI 0.04* <0.03    HbA1C: Hgb A1c MFr Bld  Date/Time Value Ref Range Status  03/18/2019 10:45 AM 14.9 (H) 4.8 - 5.6 % Final    Comment:    (NOTE) Pre diabetes:          5.7%-6.4% Diabetes:              >6.4% Glycemic control for   <7.0% adults with diabetes   03/18/2019 01:18 AM 14.9 (H) 4.8 - 5.6 % Final    Comment:    (NOTE) Pre diabetes:          5.7%-6.4% Diabetes:              >6.4% Glycemic control for   <7.0% adults with diabetes     CBG: Recent Labs  Lab 03/23/19 0012 03/23/19 0301 03/23/19 0745 03/23/19 1152 03/23/19 1611  GLUCAP 240* 243* 156* 215* 270*    Recent Results (from the past 240 hour(s))  Blood Culture (routine x 2)     Status: None   Collection Time: 03/21/2019  8:24 PM  Result Value Ref Range Status   Specimen Description BLOOD LEFT FOREARM  Final   Special Requests   Final    BOTTLES DRAWN AEROBIC ONLY Blood Culture adequate volume   Culture   Final    NO GROWTH 5 DAYS Performed at [facility], [place] [address] [place], [place] [address]    Report Status 03/22/2019 FINAL  Final  Blood Culture (routine x 2)     Status: Abnormal    Collection Time: 03/09/2019 10:03 PM  Result Value Ref Range Status   Specimen  Description BLOOD RIGHT IV  Final   Special Requests   Final    BOTTLES DRAWN AEROBIC AND ANAEROBIC Blood Culture results may not be optimal due to an excessive volume of blood received in culture bottles   Culture  Setup Time   Final    GRAM POSITIVE COCCI ANAEROBIC BOTTLE ONLY Organism ID to follow CRITICAL RESULT CALLED TO, READ BACK BY AND VERIFIED WITH: Jene Every PharmD 16:05 03/18/19 ([email or web address]) AEROBIC BOTTLE ONLY GRAM POSITIVE RODS CRITICAL RESULT CALLED TO, READ BACK BY AND VERIFIED WITH: E WILLIAMSON PHARMD 03/18/19 2128 JDW    Culture (A)  Final    STAPHYLOCOCCUS SPECIES (COAGULASE NEGATIVE) DIPHTHEROIDS(CORYNEBACTERIUM SPECIES) THE SIGNIFICANCE OF ISOLATING THIS ORGANISM FROM A SINGLE SET OF BLOOD CULTURES WHEN MULTIPLE SETS ARE DRAWN IS UNCERTAIN. PLEASE NOTIFY THE MICROBIOLOGY DEPARTMENT WITHIN ONE WEEK IF SPECIATION AND SENSITIVITIES ARE REQUIRED. Performed at [facility], [place] [address], [place], [place] [address]    Report Status 03/19/2019 FINAL  Final  Blood Culture ID Panel (Reflexed)     Status: Abnormal   Collection Time: 03/12/2019 10:03 PM  Result Value Ref Range Status   Enterococcus species NOT DETECTED NOT DETECTED Final   Listeria monocytogenes NOT DETECTED NOT DETECTED Final   Staphylococcus species DETECTED (A) NOT DETECTED Final    Comment: Methicillin (oxacillin) resistant coagulase negative staphylococcus. Possible blood culture contaminant (unless isolated from more than one blood culture draw or clinical case suggests pathogenicity). No antibiotic treatment is indicated for blood  culture contaminants. CRITICAL RESULT CALLED TO, READ BACK BY AND VERIFIED WITH: Jene Every PharmD 16:05 03/18/19 ([email or web address])    Staphylococcus aureus (BCID) NOT DETECTED NOT DETECTED Final   Methicillin resistance DETECTED (A) NOT DETECTED Final    Comment: CRITICAL RESULT CALLED TO, READ BACK BY AND  VERIFIED WITH: Jene Every PharmD 16:05 03/18/19 ([email or web address])    Streptococcus species NOT DETECTED NOT DETECTED Final   Streptococcus agalactiae NOT DETECTED NOT DETECTED Final   Streptococcus pneumoniae NOT DETECTED NOT DETECTED Final   Streptococcus pyogenes NOT DETECTED NOT DETECTED Final   Acinetobacter baumannii NOT DETECTED NOT DETECTED Final   Enterobacteriaceae species NOT DETECTED NOT DETECTED Final   Enterobacter cloacae complex NOT DETECTED NOT DETECTED Final   Escherichia coli NOT DETECTED NOT DETECTED Final   Klebsiella oxytoca NOT DETECTED NOT DETECTED Final   Klebsiella pneumoniae NOT DETECTED NOT DETECTED Final   Proteus species NOT DETECTED NOT DETECTED Final   Serratia marcescens NOT DETECTED NOT DETECTED Final   Haemophilus influenzae NOT DETECTED NOT DETECTED Final   Neisseria meningitidis NOT DETECTED NOT DETECTED Final   Pseudomonas aeruginosa NOT DETECTED NOT DETECTED Final   Candida albicans NOT DETECTED NOT DETECTED Final   Candida glabrata NOT DETECTED NOT DETECTED Final   Candida krusei NOT DETECTED NOT DETECTED Final   Candida parapsilosis NOT DETECTED NOT DETECTED Final   Candida tropicalis NOT DETECTED NOT DETECTED Final    Comment: Performed at [facility], [place]. [address], [place], [place] [address]  SARS Coronavirus 2 (CEPHEID- Performed in [place] hospital lab), Hosp Order     Status: Abnormal   Collection Time: 03/18/2019 11:52 PM  Result Value Ref Range Status   SARS Coronavirus 2 POSITIVE (A) NEGATIVE Final    Comment: RESULT CALLED TO, READ BACK BY AND VERIFIED WITH: P JOHNSTON RN 03/17/18 0131 JDW (NOTE) If result is NEGATIVE SARS-CoV-2 target nucleic acids are NOT DETECTED. The SARS-CoV-2 RNA is generally detectable in upper and lower  respiratory specimens during the acute phase of infection. The lowest  concentration of SARS-CoV-2 viral copies this assay can detect is 250  copies / mL. A negative result does not preclude SARS-CoV-2  infection  and should not be used as the sole basis for treatment or other  patient management decisions.  A negative result may occur with  improper specimen collection / handling, submission of specimen other  than nasopharyngeal swab, presence of viral mutation(s) within the  areas targeted by this assay, and inadequate number of viral copies  (<250 copies / mL). A negative result must be combined with clinical  observations, patient history, and epidemiological information. If result is POSITIVE SARS-CoV-2 target nucleic acids are DETECTED. The SA RS-CoV-2 RNA is generally detectable in upper and lower  respiratory specimens during the acute phase of infection.  Positive  results are indicative of active infection with SARS-CoV-2.  Clinical  correlation with patient history and other diagnostic information is  necessary to determine patient infection status.  Positive results do  not rule out bacterial infection or co-infection with other viruses. If result is PRESUMPTIVE POSTIVE SARS-CoV-2 nucleic acids MAY BE PRESENT.   A presumptive positive result was obtained on the submitted specimen  and confirmed on repeat testing.  While 2019 novel coronavirus  (SARS-CoV-2) nucleic acids may be present in the submitted sample  additional confirmatory testing may be necessary for epidemiological  and / or clinical management purposes  to differentiate between  SARS-CoV-2 and other Sarbecovirus currently known to infect humans.  If clinically indicated additional testing with an alternate test  methodology [phone]) is advi sed. The SARS-CoV-2 RNA is generally  detectable in upper and lower respiratory specimens during the acute  phase of infection. The expected result is Negative. Fact Sheet for Patients:  StrictlyIdeas.no Fact Sheet for Healthcare Providers: BankingDealers.co.za This test is not yet approved or cleared by the [place]  FDA and has been authorized for detection and/or diagnosis of SARS-CoV-2 by FDA under an Emergency Use Authorization (EUA).  This EUA will remain in effect (meaning this test can be used) for the duration of the COVID-19 declaration under Section 564(b)(1) of the Act, 21 U.S.C. section 360bbb-3(b)(1), unless the authorization is terminated or revoked sooner. Performed at [facility], [place] [address] [place], [place] [address]   Urine culture     Status: Abnormal   Collection Time: 03/18/19 12:46 AM  Result Value Ref Range Status   Specimen Description URINE, CLEAN CATCH  Final   Special Requests NONE  Final   Culture (A)  Final    <10,000 COLONIES/mL INSIGNIFICANT GROWTH Performed at [facility], [address] [place], [place] [address]    Report Status 03/19/2019 FINAL  Final  Culture, blood (routine x 2) Call MD if unable to obtain prior to antibiotics being given     Status: None   Collection Time: 03/18/19  4:00 PM  Result Value Ref Range Status   Specimen Description   Final    BLOOD RIGHT ARM Performed at [place] [address] [place], [place] [address]    Special Requests   Final    BOTTLES DRAWN AEROBIC ONLY Blood Culture results may not be optimal due to an inadequate volume of blood received in culture bottles   Culture   Final    NO GROWTH 5 DAYS Performed at [facility], [place] [address], [place], [place] [address]    Report Status 03/23/2019  FINAL  Final  Culture, blood (routine x 2) Call MD if unable to obtain prior to antibiotics being given     Status: None   Collection Time: 03/18/19  4:00 PM  Result Value Ref Range Status   Specimen Description   Final    BLOOD RIGHT HAND Performed at [place] [address] [place], [place] [address]    Special Requests   Final    BOTTLES DRAWN AEROBIC ONLY Blood Culture results may not be optimal due to an inadequate volume of blood received in  culture bottles   Culture   Final    NO GROWTH 5 DAYS Performed at [facility], [place] [address] [place], [place] [address]    Report Status 03/23/2019 FINAL  Final  MRSA PCR Screening     Status: None   Collection Time: 03/19/19  2:00 PM  Result Value Ref Range Status   MRSA by PCR NEGATIVE NEGATIVE Final    Comment:        The GeneXpert MRSA Assay (FDA approved for NASAL specimens only), is one component of a comprehensive MRSA colonization surveillance program. It is not intended to diagnose MRSA infection nor to guide or monitor treatment for MRSA infections. Performed at [facility], [place] [address] [place], [place] [address]   Culture, respiratory (non-expectorated)     Status: None (Preliminary result)   Collection Time: 03/21/19  2:42 PM  Result Value Ref Range Status   Specimen Description   Final    TRACHEAL ASPIRATE Performed at [place] [address] [place], [place] [address]    Special Requests   Final    NONE Performed at [facility], [place] [address] [place], [place] [address]    Gram Stain   Final    RARE WBC PRESENT,BOTH PMN AND MONONUCLEAR RARE YEAST Performed at [facility], [place] [address] [place],  [address]    Culture FEW YEAST  Final   Report Status PENDING  Incomplete     Scheduled Meds:  chlorhexidine  15 mL Mouth/Throat BID   Chlorhexidine Gluconate Cloth  6 each Topical Q0600   famotidine  20 mg Per Tube Daily   insulin aspart  0-9 Units Subcutaneous Q4H   insulin detemir  8 Units Subcutaneous BID   mouth rinse  15 mL Mouth Rinse 10 times per day   midodrine  2.5 mg Per Tube Q8H   pneumococcal 23 valent vaccine  0.5 mL Intramuscular Tomorrow-1000     LOS: 5 days   Cherene Altes, MD [facility] Office  [phone] Pager - Text Page per Shea Evans  If 7PM-7AM, please contact night-coverage per Amion 03/23/2019, 4:59 PM

## 2019-03-23 NOTE — Progress Notes (Addendum)
1615 Assessment and new findings, pupils fixed and dilated, no gag reflex, no response from painful stimlui. Fent gtt turned off, tube feeds off. Dr. Thereasa Solo paged. Awaiting call back.   34 Spoke to Dr. Thereasa Solo about change in assessment, heparin discontinued, awaiting Dr. Lake Bells to see patient.   1640 Dr. Lake Bells at bedside, new orders received, will continue to monitor.   1800 Pt downstairs for CT head.   1820 Pt back from CT, more arousable, opens eyes, not tracking. WCTM

## 2019-03-23 NOTE — Progress Notes (Signed)
NAMETrayvon Owen, MRN:  [id number], DOB:  06-Sep-1951, LOS: 5 ADMISSION DATE:  03/16/2019, CONSULTATION DATE: Mar 21, 2019 REFERRING MD: Dr. Thereasa Solo, CHIEF COMPLAINT: Dyspnea  Brief History   68 year old male admitted on May 19 with COVID-19 pneumonia.  Developed acute kidney injury and worsening hypoxemic respiratory failure requiring intubation and mechanical ventilation on Mar 21, 2019.  Past Medical History  CKD Cigarette smoker Hypertension Diabetes mellitus type 2  Significant Hospital Events     Consults:  PCCM  Procedures:  ET tube 5/23 >>  Right IJ CVC 5/23 >>   Significant Diagnostic Tests:    Micro Data:  SARS CoV2 5/19 >> positive Urine 5/20 >> insignificant growth Blood 5/19 >> 1 of 2 MRSE (? Contaminant) Blood 5/20 >>  Resp 5/23 >>   Antimicrobials:  Azithro 5/19 x 1 Ceftriaxone 5/19 x 1 Cefepime 5/20 >>  vanco 5/20 >> 5/22  Interim history/subjective:  Minimal urine output Off of vasopressors Remains mechanically ventilated, oxygenation improved  Objective   Blood pressure 115/69, pulse 84, temperature 97.8 F (36.6 C), temperature source Axillary, resp. rate (!) 21, height 5\' 1"  (1.549 m), weight 61.1 kg, SpO2 96 %. CVP:  [1 mmHg-12 mmHg] 9 mmHg  Vent Mode: PRVC FiO2 (%):  [40 %-50 %] 40 % Set Rate:  [28 bmp] 28 bmp Vt Set:  [310 mL] 310 mL PEEP:  [8 cmH20-10 cmH20] 8 cmH20   Intake/Output Summary (Last 24 hours) at 03/23/2019 1359 Last data filed at 03/23/2019 1158 Gross per 24 hour  Intake 2704.14 ml  Output 310 ml  Net 2394.14 ml   Filed Weights   03/18/19 0423 03/23/19 0500  Weight: 58.3 kg 61.1 kg    Examination:  General:  In bed on vent HENT: NCAT ETT in place PULM: CTA B, vent supported breathing CV: RRR, no mgr GI: BS+, soft, nontender MSK: normal bulk and tone Neuro: sedated on vent   Resolved Hospital Problem list     Assessment & Plan:  Acute respiratory failure with hypoxemia due to COVID-19: Hold room death  severe given renal failure Status post Actemra and corticosteroids Continue full mechanical ventilatory support per ARDS protocol 6 to 8 cc/kg tidal volume ideal body weight Ventilator associated pneumonia prevention protocol Wean PEEP and FiO2 per ARDS table, goal SaO2 greater than 88% We will likely be able to start spontaneous breathing trials in the next 24 hours based on current course of oxygen needs  Acute kidney injury: Agree with volume resuscitation today Monitor BMET and UOP Replace electrolytes as needed  Autonomic instability/hypotension: Shock resolved/chronic hypotension Monitor off of norepinephrine Continue Midrin  Need for mechanical ventilation, sedation for ventilator synchrony: Adjust RASS score goal to -2 Fentanyl infusion, as needed Versed  Best practice:  Diet: Tube feeding Pain/Anxiety/Delirium protocol (if indicated): yes, RASS -2 VAP protocol (if indicated): yes DVT prophylaxis: sub q heparin GI prophylaxis: famotidine Glucose control: SSI Mobility: bedrest Code Status: full Family Communication: per [facility] Disposition: remain in ICU  Labs   CBC: Recent Labs  Lab 03/19/19 0516 03/20/19 0525 03/21/19 0419 03/21/19 2029 03/22/19 0341 03/22/19 0433 03/23/19 0322  WBC 7.5 4.6 6.4  --  7.0  --  8.3  NEUTROABS 6.8 4.2 5.8  --  6.4  --  7.4  HGB 10.8* 11.2* 10.9* 9.5* 9.7* 12.2* 9.2*  HCT 33.5* 34.4* 33.2* 28.0* 30.5* 36.0* 30.1*  MCV 90.5 89.6 88.1  --  91.6  --  96.2  PLT 149* 141* 156  --  122*  --  89*    Basic Metabolic Panel: Recent Labs  Lab 03/18/19 0118  03/19/19 0516 03/20/19 0525 03/21/19 2029 03/22/19 0341 03/22/19 0433 03/22/19 1830 03/23/19 0322  NA 134*   < > 141 138 143 142 145  --  144  K 4.4   < > 4.8 4.5 4.5 4.6 4.7  --  4.6  CL 105  --  114* 111  --  115*  --   --  116*  CO2 16*  --  18* 18*  --  21*  --   --  23  GLUCOSE 414*  --  163* 296*  --  155*  --   --  278*  BUN 42*  --  54* 59*  --  70*  --   --  93*   CREATININE 3.00*  --  3.18* 2.61*  --  2.50*  --   --  4.04*  CALCIUM 7.9*  --  8.3* 8.8*  --  8.7*  --   --  8.2*  MG  --   --   --   --   --   --   --  2.5* 2.5*  PHOS  --   --   --   --   --   --   --  5.8* 6.1*   < > = values in this interval not displayed.   GFR: Estimated Creatinine Clearance: 12.9 mL/min (A) (by C-G formula based on SCr of 4.04 mg/dL (H)). Recent Labs  Lab 03/28/2019 2155 03/02/2019 2355 03/18/19 0118 03/18/19 1600  03/20/19 0525 03/21/19 0419 03/22/19 0341 03/23/19 0322  PROCALCITON  --   --  88.27  --   --   --   --   --   --   WBC  --   --   --   --    < > 4.6 6.4 7.0 8.3  LATICACIDVEN 3.2* 1.5  --  2.1*  --   --   --   --   --    < > = values in this interval not displayed.    Liver Function Tests: Recent Labs  Lab 03/04/2019 2139 03/21/19 0419 03/22/19 0341 03/23/19 0322  AST 50* 47* 37 35  ALT 19 24 21 20   ALKPHOS 59 106 73 79  BILITOT 0.7 0.8 0.5 0.3  PROT 6.8 6.0* 5.8* 5.9*  ALBUMIN 2.6* 2.2* 2.1* 2.0*   Recent Labs  Lab 03/22/2019 2139  LIPASE 24   No results for input(s): AMMONIA in the last 168 hours.  ABG    Component Value Date/Time   PHART 7.207 (L) 03/22/2019 0433   PCO2ART 50.6 (H) 03/22/2019 0433   PO2ART 102.0 03/22/2019 0433   HCO3 20.0 03/22/2019 0433   TCO2 22 03/22/2019 0433   ACIDBASEDEF 8.0 (H) 03/22/2019 0433   O2SAT 96.0 03/22/2019 0433     Coagulation Profile: No results for input(s): INR, PROTIME in the last 168 hours.  Cardiac Enzymes: Recent Labs  Lab 03/18/19 0118 03/22/19 0342  TROPONINI 0.04* <0.03    HbA1C: Hgb A1c MFr Bld  Date/Time Value Ref Range Status  03/18/2019 10:45 AM 14.9 (H) 4.8 - 5.6 % Final    Comment:    (NOTE) Pre diabetes:          5.7%-6.4% Diabetes:              >6.4% Glycemic control for   <7.0% adults with diabetes   03/18/2019 01:18 AM 14.9 (  H) 4.8 - 5.6 % Final    Comment:    (NOTE) Pre diabetes:          5.7%-6.4% Diabetes:              >6.4% Glycemic control  for   <7.0% adults with diabetes     CBG: Recent Labs  Lab 03/22/19 1951 03/23/19 0012 03/23/19 0301 03/23/19 0745 03/23/19 1152  GLUCAP 224* 240* 243* 156* 215*       Critical care time: 35 minutes     Roselie Awkward, MD [facility] Pager: [phone] Cell: [phone] If no response, call [phone]

## 2019-03-23 NOTE — Progress Notes (Signed)
[facility] PCCM  Called to bedside to assess neuro exam  On exam Grimace to pain, slight Minimal cough to suction Pupils are dilated and unresponsive to light, but the pupil on the left appears to be chronically damaged  Plan Bolus saline as hypotensive now CT head Hold sedation Consider EEG if CT head negative  Roselie Awkward, MD [facility] Pager: [phone] Cell: [phone] If no response, call [phone]

## 2019-03-23 NOTE — Progress Notes (Signed)
Pharmacy Antibiotic Note  Brian Owen is a 68 y.o. male admitted on 03/23/2019 with pneumonia.  Pharmacy has been consulted for Cefepime dosing. SCr has worsened today.   Plan:  Decrease cefepime to 1g IV q24  Complete 8 days per MD, stop date in place  Height: 5\' 1"  (154.9 cm) Weight: 134 lb 9.6 oz (61.1 kg) IBW/kg (Calculated) : 52.3  Temp (24hrs), Avg:97.7 F (36.5 C), Min:97 F (36.1 C), Max:98.5 F (36.9 C)  Recent Labs  Lab 03/24/2019 2155 03/20/2019 2355 03/18/19 0118 03/18/19 1600 03/19/19 0516 03/20/19 0525 03/21/19 0419 03/22/19 0341 03/23/19 0322  WBC  --   --   --   --  7.5 4.6 6.4 7.0 8.3  CREATININE  --   --  3.00*  --  3.18* 2.61*  --  2.50* 4.04*  LATICACIDVEN 3.2* 1.5  --  2.1*  --   --   --   --   --     Estimated Creatinine Clearance: 12.9 mL/min (A) (by C-G formula based on SCr of 4.04 mg/dL (H)).    No Known Allergies  Antimicrobials this admission: 5/19 Azithromycin x1 5/19 Ceftriaxone x1 5/20 Vitamin C  >> 5/23 5/20 zinc sulfate >> 5/23 5/20 Vancomycin >> 5/22 5/20 Cefepime >> 5/27 5/21 Actemra x1 5/23 Actemra x1  Dose adjustments this admission:  Microbiology results: 5/19 SARS Coronavirus 2: positive 5/19 BCx: 1/4 bottles CNS, 1/4 bottles Diphtheroids (no BCID done) 5/20 UCx: <10k 5/20 MRSA PCR: neg 5/20 blood: negF  5/20 Strep pneumo Ur Ag: positive 5/23 TA: pending  Albertina Parr, PharmD., BCPS Clinical Pharmacist Clinical phone for 03/23/19 until 5pm: [phone]

## 2019-03-24 ENCOUNTER — Inpatient Hospital Stay (HOSPITAL_COMMUNITY): Payer: Medicare Other

## 2019-03-24 LAB — BASIC METABOLIC PANEL
Anion gap: 4 — ABNORMAL LOW (ref 5–15)
BUN: 100 mg/dL — ABNORMAL HIGH (ref 8–23)
CO2: 18 mmol/L — ABNORMAL LOW (ref 22–32)
Calcium: 7.7 mg/dL — ABNORMAL LOW (ref 8.9–10.3)
Chloride: 123 mmol/L — ABNORMAL HIGH (ref 98–111)
Creatinine, Ser: 4.07 mg/dL — ABNORMAL HIGH (ref 0.61–1.24)
GFR calc Af Amer: 16 mL/min — ABNORMAL LOW (ref >60)
GFR calc non Af Amer: 14 mL/min — ABNORMAL LOW (ref >60)
Glucose, Bld: 229 mg/dL — ABNORMAL HIGH (ref 70–99)
Potassium: 4.4 mmol/L (ref 3.5–5.1)
Sodium: 145 mmol/L (ref 135–145)

## 2019-03-24 LAB — COMPREHENSIVE METABOLIC PANEL
ALT: 20 U/L (ref 0–44)
AST: 30 U/L (ref 15–41)
Albumin: 1.9 g/dL — ABNORMAL LOW (ref 3.5–5.0)
Alkaline Phosphatase: 89 U/L (ref 38–126)
Anion gap: 4 — ABNORMAL LOW (ref 5–15)
BUN: 98 mg/dL — ABNORMAL HIGH (ref 8–23)
CO2: 19 mmol/L — ABNORMAL LOW (ref 22–32)
Calcium: 8 mg/dL — ABNORMAL LOW (ref 8.9–10.3)
Chloride: 121 mmol/L — ABNORMAL HIGH (ref 98–111)
Creatinine, Ser: 4.31 mg/dL — ABNORMAL HIGH (ref 0.61–1.24)
GFR calc Af Amer: 15 mL/min — ABNORMAL LOW (ref >60)
GFR calc non Af Amer: 13 mL/min — ABNORMAL LOW (ref >60)
Glucose, Bld: 195 mg/dL — ABNORMAL HIGH (ref 70–99)
Potassium: 4.4 mmol/L (ref 3.5–5.1)
Sodium: 144 mmol/L (ref 135–145)
Total Bilirubin: 0.2 mg/dL — ABNORMAL LOW (ref 0.3–1.2)
Total Protein: 5.4 g/dL — ABNORMAL LOW (ref 6.5–8.1)

## 2019-03-24 LAB — CBC WITH DIFFERENTIAL/PLATELET
Abs Immature Granulocytes: 0.07 10*3/uL (ref 0.00–0.07)
Basophils Absolute: 0 10*3/uL (ref 0.0–0.1)
Basophils Relative: 0 %
Eosinophils Absolute: 0.1 10*3/uL (ref 0.0–0.5)
Eosinophils Relative: 1 %
HCT: 28.4 % — ABNORMAL LOW (ref 39.0–52.0)
Hemoglobin: 8.8 g/dL — ABNORMAL LOW (ref 13.0–17.0)
Immature Granulocytes: 1 %
Lymphocytes Relative: 4 %
Lymphs Abs: 0.6 10*3/uL — ABNORMAL LOW (ref 0.7–4.0)
MCH: 29.2 pg (ref 26.0–34.0)
MCHC: 31 g/dL (ref 30.0–36.0)
MCV: 94.4 fL (ref 80.0–100.0)
Monocytes Absolute: 0.1 10*3/uL (ref 0.1–1.0)
Monocytes Relative: 1 %
Neutro Abs: 12.5 10*3/uL — ABNORMAL HIGH (ref 1.7–7.7)
Neutrophils Relative %: 93 %
Platelets: 71 10*3/uL — ABNORMAL LOW (ref 150–400)
RBC: 3.01 MIL/uL — ABNORMAL LOW (ref 4.22–5.81)
RDW: 15.3 % (ref 11.5–15.5)
WBC: 13.4 10*3/uL — ABNORMAL HIGH (ref 4.0–10.5)
nRBC: 0.7 % — ABNORMAL HIGH (ref 0.0–0.2)

## 2019-03-24 LAB — GLUCOSE, CAPILLARY
Glucose-Capillary: 163 mg/dL — ABNORMAL HIGH (ref 70–99)
Glucose-Capillary: 231 mg/dL — ABNORMAL HIGH (ref 70–99)
Glucose-Capillary: 220 mg/dL — ABNORMAL HIGH (ref 70–99)
Glucose-Capillary: 214 mg/dL — ABNORMAL HIGH (ref 70–99)
Glucose-Capillary: 221 mg/dL — ABNORMAL HIGH (ref 70–99)
Glucose-Capillary: 237 mg/dL — ABNORMAL HIGH (ref 70–99)

## 2019-03-24 LAB — CULTURE, RESPIRATORY W GRAM STAIN

## 2019-03-24 LAB — PHOSPHORUS: Phosphorus: 4.5 mg/dL (ref 2.5–4.6)

## 2019-03-24 LAB — FERRITIN: Ferritin: 319 ng/mL (ref 24–336)

## 2019-03-24 LAB — C-REACTIVE PROTEIN: CRP: 4.2 mg/dL — ABNORMAL HIGH (ref <1.0)

## 2019-03-24 LAB — VITAMIN B12: Vitamin B-12: 896 pg/mL (ref 180–914)

## 2019-03-24 LAB — FOLATE: Folate: 8.2 ng/mL (ref >5.9)

## 2019-03-24 LAB — D-DIMER, QUANTITATIVE: D-Dimer, Quant: 2.74 ug/mL-FEU — ABNORMAL HIGH (ref 0.00–0.50)

## 2019-03-24 LAB — AMMONIA: Ammonia: 39 umol/L — ABNORMAL HIGH (ref 9–35)

## 2019-03-24 LAB — MAGNESIUM: Magnesium: 2.6 mg/dL — ABNORMAL HIGH (ref 1.7–2.4)

## 2019-03-24 MED ORDER — DEXMEDETOMIDINE HCL IN NACL 400 MCG/100ML IV SOLN
0.4000 ug/kg/h | INTRAVENOUS | Status: DC
  Administered 2019-03-24: 0.8 ug/kg/h via INTRAVENOUS
  Filled 2019-03-24 (×2): qty 100

## 2019-03-24 MED ORDER — ATROPINE SULFATE 1 MG/10ML IJ SOSY
PREFILLED_SYRINGE | INTRAMUSCULAR | Status: AC
  Filled 2019-03-24: qty 10

## 2019-03-24 MED ORDER — SODIUM CHLORIDE 0.9 % IV BOLUS
500.0000 mL | Freq: Once | INTRAVENOUS | Status: AC
  Administered 2019-03-24: 500 mL via INTRAVENOUS

## 2019-03-24 MED ORDER — SODIUM CHLORIDE 0.9 % IV SOLN
INTRAVENOUS | Status: DC | PRN
  Administered 2019-03-24: 18:00:00 125 mL/h via INTRAVENOUS

## 2019-03-24 MED ORDER — SODIUM CHLORIDE 0.9 % IV SOLN: INTRAVENOUS | Status: AC | PRN

## 2019-03-24 MED ORDER — NOREPINEPHRINE 4 MG/250ML-% IV SOLN
0.0000 ug/min | INTRAVENOUS | Status: DC
  Administered 2019-03-24: 22:00:00 2 ug/min via INTRAVENOUS
  Administered 2019-03-25: 15:00:00 8 ug/min via INTRAVENOUS
  Administered 2019-03-25: 11 ug/min via INTRAVENOUS
  Administered 2019-03-26: 3 ug/min via INTRAVENOUS
  Administered 2019-03-26: 8 ug/min via INTRAVENOUS
  Administered 2019-03-27: 10 ug/min via INTRAVENOUS
  Filled 2019-03-24 (×5): qty 250

## 2019-03-24 MED ORDER — HYDROMORPHONE BOLUS VIA INFUSION
0.5000 mg | INTRAVENOUS | Status: DC | PRN
  Administered 2019-03-26 – 2019-03-30 (×8): 0.5 mg via INTRAVENOUS
  Filled 2019-03-24: qty 1

## 2019-03-24 MED ORDER — SODIUM CHLORIDE 0.9 % IV SOLN
1.0000 mg/h | INTRAVENOUS | Status: DC
  Administered 2019-03-24: 1 mg/h via INTRAVENOUS
  Administered 2019-03-26 – 2019-03-27 (×2): 2 mg/h via INTRAVENOUS
  Administered 2019-03-29: 4 mg/h via INTRAVENOUS
  Administered 2019-03-30: 15:00:00 2 mg/h via INTRAVENOUS
  Administered 2019-03-30: 5 mg/h via INTRAVENOUS
  Filled 2019-03-24 (×7): qty 5

## 2019-03-24 MED ORDER — HEPARIN SODIUM (PORCINE) 5000 UNIT/ML IJ SOLN
5000.0000 [IU] | Freq: Three times a day (TID) | INTRAMUSCULAR | Status: DC
  Administered 2019-03-24 – 2019-03-31 (×21): 5000 [IU] via SUBCUTANEOUS
  Filled 2019-03-24 (×23): qty 1

## 2019-03-24 MED ORDER — SODIUM CHLORIDE 0.9 % IV SOLN
INTRAVENOUS | Status: DC | PRN
  Administered 2019-03-24: 100 mL/h via INTRAVENOUS

## 2019-03-24 MED ORDER — NOREPINEPHRINE 4 MG/250ML-% IV SOLN
INTRAVENOUS | Status: AC
  Administered 2019-03-24: 2 ug/min via INTRAVENOUS
  Filled 2019-03-24: qty 250

## 2019-03-24 NOTE — Progress Notes (Signed)
  CMM at bedside to assess patient.  Patient became bradycardic and Spo2 dropped in low 80's without any movement.  Okay with Peep at 12  And Fio2 at 100%. Okay to increased peep if necessary.  Adjust to 80% fio2 and 14 of peep per ARDS protocol.  spo2 currently at 94%.

## 2019-03-24 NOTE — Progress Notes (Addendum)
Attempted to call daughter to give update using both phone numbers listed but was unsuccessful.

## 2019-03-24 NOTE — Progress Notes (Signed)
At appx 2112 pt began to desat, become bradycardic and hypotensive. Attempts to increase oxygenation, including suction and 100% FiO2 were unsuccessful RT was notified- PEEP increased from 5 to 12. MD on call notified- verbal orders to start levophed. MD at bedside.

## 2019-03-24 NOTE — Progress Notes (Signed)
Inpatient Diabetes Program Recommendations  AACE/ADA: New Consensus Statement on Inpatient Glycemic Control (2015)  Target Ranges:  Prepandial:   less than 140 mg/dL      Peak postprandial:   less than 180 mg/dL (1-2 hours)      Critically ill patients:  140 - 180 mg/dL    Results for Brian Owen, Brian Owen (MRN [id number]) as of 03/24/2019 13:49  Ref. Range 03/23/2019 07:45 03/23/2019 11:52 03/23/2019 16:11 03/23/2019 20:03 03/23/2019 23:11 03/24/2019 03:08 03/24/2019 07:35 03/24/2019 11:23  Glucose-Capillary Latest Ref Range: 70 - 99 mg/dL 156 (H) 215 (H) 270 (H) 259 (H) 207 (H) 163 (H) 231 (H) 220 (H)   Review of Glycemic Control  Diabetes history: DM2   Outpatient Diabetes medications: Lantus 9 units QHS (per chart inconsistent with taking)  Current orders for Inpatient glycemic control: Novolog (0-9) sensitive scale Q4  Solumedrol 40 mg Q8 hours    Patient's CBG running above target goals.    MD please consider the following adjustments:    Novolog 3 units tube feed coverage q4 hrs  -- Will follow during hospitalization.--  Tama Headings RN, MSN, BC-ADM Inpatient Diabetes Coordinator Team Pager [phone] (8a-5p)

## 2019-03-24 NOTE — Progress Notes (Signed)
Pts daughter called to get an update on patient. Her numbers were incorrect in the chart and this was corrected. Updates given and all questions answered. Daughter expressed gratitude to the staff for the care her father is receiving.

## 2019-03-24 NOTE — Progress Notes (Signed)
[place] TEAM 1 - Stepdown/ICU TEAM  Edwin Dada  ZMC:[id number] DOB: [birth year] DOA: 03/09/2019 PCP: Alfonse Spruce, FNP    Brief Narrative:  24SL w/ a hx of HTN, DM, CKD Stage 3, and multiple prior admissions for orthostatic hypotension related to autonomic dysfunction (noncompliant w/ midodrine) who presented w/ fever generalized weakness hypotension and tachypnea after not feeling well in general for a week. He was seen on 5/17 for orthostatic hypotension and was given fluids started and midodrine and discharged home.  In the ED he was found to be hyperglycemic and febrile with a creatinine of 3.3 (baseline 2.0) and a CXR noting multifocal interstitial opacities.    Significant Events: 5/19 admit  5/22 saturations declining 5/23 transferred to ICU - intubated - R IJ CVL  COVID-19 specific Treatment: Actemra 5/21 & 5/23 Steroids 5/21 > 5/23  Ventilator Settings: Vent Mode: PRVC FiO2 (%):  [50 %-80 %] 80 % Set Rate:  [28 bmp] 28 bmp Vt Set:  [310 mL] 310 mL PEEP:  [5 cmH20-8 cmH20] 5 cmH20 Plateau Pressure:  [14 cmH20-18 cmH20] 14 cmH20  Subjective: The patient appears to have alternating moments where he is either severely sedated on seemingly low doses of sedative medications, or agitated and fighting against tubing/the ventilator.  At the time of my exam he appears to be resting comfortably.  His renal failure persists.  His blood pressure remains marginal.  For the most part he appears to be tolerating ventilation quite well.  Attempts at intermittent weaning persist.  Assessment & Plan:  Acute hypoxic respiratory failure -COVID-19 pneumonia -sepsis Has received 2 doses of actemra - not a candidate for remdisivir due to renal failure - appears stable on vent from a pulmonary standpoint - vent management per PCCM   Recent Labs    03/22/19 0341 03/23/19 0322 03/24/19 0225  DDIMER 2.42* 2.68* 2.74*  FERRITIN 60 364* 319  LDH 381*  --   --   CRP 8.9* 6.5* 4.2*     Acute kidney injury on CKD Baseline crt 2.0 -challenge with volume expansion today and follow renal function closely -no acute indication presently but may soon require CRRT  Recent Labs  Lab 03/22/19 0341 03/23/19 0322 03/23/19 1431 03/24/19 0225 03/24/19 1442  CREATININE 2.50* 4.04* 4.09* 4.31* 4.07*   Autonomic dysfunction /chronic orthostatic hypotension Cont midodrine per tube unless pressors initiated   Metabolic encephalopathy - acute  DM2 -uncontrolled with hyperglycemia CBG controlled at this time   Mildly elevated troponin Denied chest pain when alert - likely related to renal dysfunction and hypotension - Troponin normal in follow-up  Positive blood culture - Coag neg Staph + Diptheroids (1 of 2 blood cx) Clinically most consistent with contamination - antibiotics not indicated for this issue at present  DVT prophylaxis: Lovenox Code Status: FULL CODE Family Communication:  Disposition Plan: ICU  Consultants:  none  Antimicrobials:  Azithromycin 5/19 Ceftriaxone 5/19 Cefepime 5/20 > Vancomycin 5/20 > 5/22  Objective: Blood pressure (!) 107/56, pulse 73, temperature 97.8 F (36.6 C), temperature source Oral, resp. rate 13, height 5\' 1"  (1.549 m), weight 61.1 kg, SpO2 92 %.  Intake/Output Summary (Last 24 hours) at 03/24/2019 1659 Last data filed at 03/24/2019 1638 Gross per 24 hour  Intake 2323.09 ml  Output 695 ml  Net 1628.09 ml   Filed Weights   03/18/19 0423 03/23/19 0500  Weight: 58.3 kg 61.1 kg    Examination: General: Mildly agitated with touch during exam Lungs: fine crackles persist diffusely  with no wheezing Cardiovascular: RRR  Abdomen: Nondistended, soft, BS hypoactive, no ascites -no appreciable mass Extremities: No C/C/E B lower extremities   CBC: Recent Labs  Lab 03/22/19 0341 03/22/19 0433 03/23/19 0322 03/24/19 0225  WBC 7.0  --  8.3 13.4*  NEUTROABS 6.4  --  7.4 12.5*  HGB 9.7* 12.2* 9.2* 8.8*  HCT 30.5* 36.0* 30.1*  28.4*  MCV 91.6  --  96.2 94.4  PLT 122*  --  89* 71*   Basic Metabolic Panel: Recent Labs  Lab 03/22/19 1830 03/23/19 0322 03/23/19 1431 03/24/19 0225 03/24/19 1442  NA  --  144 143 144 145  K  --  4.6 4.9 4.4 4.4  CL  --  116* 118* 121* 123*  CO2  --  23 20* 19* 18*  GLUCOSE  --  278* 273* 195* 229*  BUN  --  93* 95* 98* 100*  CREATININE  --  4.04* 4.09* 4.31* 4.07*  CALCIUM  --  8.2* 7.9* 8.0* 7.7*  MG 2.5* 2.5*  --  2.6*  --   PHOS 5.8* 6.1*  --  4.5  --    GFR: Estimated Creatinine Clearance: 12.9 mL/min (A) (by C-G formula based on SCr of 4.07 mg/dL (H)).  Liver Function Tests: Recent Labs  Lab 03/21/19 0419 03/22/19 0341 03/23/19 0322 03/24/19 0225  AST 47* 37 35 30  ALT 24 21 20 20   ALKPHOS 106 73 79 89  BILITOT 0.8 0.5 0.3 0.2*  PROT 6.0* 5.8* 5.9* 5.4*  ALBUMIN 2.2* 2.1* 2.0* 1.9*   Recent Labs  Lab 03/22/2019 2139  LIPASE 24    Cardiac Enzymes: Recent Labs  Lab 03/18/19 0118 03/22/19 0342  TROPONINI 0.04* <0.03    HbA1C: Hgb A1c MFr Bld  Date/Time Value Ref Range Status  03/18/2019 10:45 AM 14.9 (H) 4.8 - 5.6 % Final    Comment:    (NOTE) Pre diabetes:          5.7%-6.4% Diabetes:              >6.4% Glycemic control for   <7.0% adults with diabetes   03/18/2019 01:18 AM 14.9 (H) 4.8 - 5.6 % Final    Comment:    (NOTE) Pre diabetes:          5.7%-6.4% Diabetes:              >6.4% Glycemic control for   <7.0% adults with diabetes     CBG: Recent Labs  Lab 03/23/19 2311 03/24/19 0308 03/24/19 0735 03/24/19 1123 03/24/19 1558  GLUCAP 207* 163* 231* 220* 214*    Recent Results (from the past 240 hour(s))  Blood Culture (routine x 2)     Status: None   Collection Time: 03/03/2019  8:24 PM  Result Value Ref Range Status   Specimen Description BLOOD LEFT FOREARM  Final   Special Requests   Final    BOTTLES DRAWN AEROBIC ONLY Blood Culture adequate volume   Culture   Final    NO GROWTH 5 DAYS Performed at [facility], [address] [place], [place] [address]    Report Status 03/22/2019 FINAL  Final  Blood Culture (routine x 2)     Status: Abnormal   Collection Time: 03/27/2019 10:03 PM  Result Value Ref Range Status   Specimen Description BLOOD RIGHT IV  Final   Special Requests   Final    BOTTLES DRAWN AEROBIC AND ANAEROBIC Blood Culture results may not be optimal  due to an excessive volume of blood received in culture bottles   Culture  Setup Time   Final    GRAM POSITIVE COCCI ANAEROBIC BOTTLE ONLY Organism ID to follow CRITICAL RESULT CALLED TO, READ BACK BY AND VERIFIED WITH: Jene Every PharmD 16:05 03/18/19 ([email or web address]) AEROBIC BOTTLE ONLY GRAM POSITIVE RODS CRITICAL RESULT CALLED TO, READ BACK BY AND VERIFIED WITH: E WILLIAMSON PHARMD 03/18/19 2128 JDW    Culture (A)  Final    STAPHYLOCOCCUS SPECIES (COAGULASE NEGATIVE) DIPHTHEROIDS(CORYNEBACTERIUM SPECIES) THE SIGNIFICANCE OF ISOLATING THIS ORGANISM FROM A SINGLE SET OF BLOOD CULTURES WHEN MULTIPLE SETS ARE DRAWN IS UNCERTAIN. PLEASE NOTIFY THE MICROBIOLOGY DEPARTMENT WITHIN ONE WEEK IF SPECIATION AND SENSITIVITIES ARE REQUIRED. Performed at [facility], [place] [address] [place], [place] [address]    Report Status 03/19/2019 FINAL  Final  Blood Culture ID Panel (Reflexed)     Status: Abnormal   Collection Time: 03/24/2019 10:03 PM  Result Value Ref Range Status   Enterococcus species NOT DETECTED NOT DETECTED Final   Listeria monocytogenes NOT DETECTED NOT DETECTED Final   Staphylococcus species DETECTED (A) NOT DETECTED Final    Comment: Methicillin (oxacillin) resistant coagulase negative staphylococcus. Possible blood culture contaminant (unless isolated from more than one blood culture draw or clinical case suggests pathogenicity). No antibiotic treatment is indicated for blood  culture contaminants. CRITICAL RESULT CALLED TO, READ BACK BY AND VERIFIED WITH: Jene Every PharmD 16:05 03/18/19 ([email or web address])    Staphylococcus aureus (BCID) NOT  DETECTED NOT DETECTED Final   Methicillin resistance DETECTED (A) NOT DETECTED Final    Comment: CRITICAL RESULT CALLED TO, READ BACK BY AND VERIFIED WITH: Jene Every PharmD 16:05 03/18/19 ([email or web address])    Streptococcus species NOT DETECTED NOT DETECTED Final   Streptococcus agalactiae NOT DETECTED NOT DETECTED Final   Streptococcus pneumoniae NOT DETECTED NOT DETECTED Final   Streptococcus pyogenes NOT DETECTED NOT DETECTED Final   Acinetobacter baumannii NOT DETECTED NOT DETECTED Final   Enterobacteriaceae species NOT DETECTED NOT DETECTED Final   Enterobacter cloacae complex NOT DETECTED NOT DETECTED Final   Escherichia coli NOT DETECTED NOT DETECTED Final   Klebsiella oxytoca NOT DETECTED NOT DETECTED Final   Klebsiella pneumoniae NOT DETECTED NOT DETECTED Final   Proteus species NOT DETECTED NOT DETECTED Final   Serratia marcescens NOT DETECTED NOT DETECTED Final   Haemophilus influenzae NOT DETECTED NOT DETECTED Final   Neisseria meningitidis NOT DETECTED NOT DETECTED Final   Pseudomonas aeruginosa NOT DETECTED NOT DETECTED Final   Candida albicans NOT DETECTED NOT DETECTED Final   Candida glabrata NOT DETECTED NOT DETECTED Final   Candida krusei NOT DETECTED NOT DETECTED Final   Candida parapsilosis NOT DETECTED NOT DETECTED Final   Candida tropicalis NOT DETECTED NOT DETECTED Final    Comment: Performed at [facility], [place]. [address], [place], [place] [address]  SARS Coronavirus 2 (CEPHEID- Performed in [facility] lab), Hosp Order     Status: Abnormal   Collection Time: 03/05/2019 11:52 PM  Result Value Ref Range Status   SARS Coronavirus 2 POSITIVE (A) NEGATIVE Final    Comment: RESULT CALLED TO, READ BACK BY AND VERIFIED WITH: P JOHNSTON RN 03/17/18 0131 JDW (NOTE) If result is NEGATIVE SARS-CoV-2 target nucleic acids are NOT DETECTED. The SARS-CoV-2 RNA is generally detectable in upper and lower  respiratory specimens during the acute phase of infection. The  lowest  concentration of SARS-CoV-2 viral copies this assay can detect is 250  copies / mL. A  negative result does not preclude SARS-CoV-2 infection  and should not be used as the sole basis for treatment or other  patient management decisions.  A negative result may occur with  improper specimen collection / handling, submission of specimen other  than nasopharyngeal swab, presence of viral mutation(s) within the  areas targeted by this assay, and inadequate number of viral copies  (<250 copies / mL). A negative result must be combined with clinical  observations, patient history, and epidemiological information. If result is POSITIVE SARS-CoV-2 target nucleic acids are DETECTED. The SA RS-CoV-2 RNA is generally detectable in upper and lower  respiratory specimens during the acute phase of infection.  Positive  results are indicative of active infection with SARS-CoV-2.  Clinical  correlation with patient history and other diagnostic information is  necessary to determine patient infection status.  Positive results do  not rule out bacterial infection or co-infection with other viruses. If result is PRESUMPTIVE POSTIVE SARS-CoV-2 nucleic acids MAY BE PRESENT.   A presumptive positive result was obtained on the submitted specimen  and confirmed on repeat testing.  While 2019 novel coronavirus  (SARS-CoV-2) nucleic acids may be present in the submitted sample  additional confirmatory testing may be necessary for epidemiological  and / or clinical management purposes  to differentiate between  SARS-CoV-2 and other Sarbecovirus currently known to infect humans.  If clinically indicated additional testing with an alternate test  methodology [phone] is advi sed. The SARS-CoV-2 RNA is generally  detectable in upper and lower respiratory specimens during the acute  phase of infection. The expected result is Negative. Fact Sheet for Patients:  StrictlyIdeas.no  Fact Sheet for Healthcare Providers: BankingDealers.co.za This test is not yet approved or cleared by the [place] FDA and has been authorized for detection and/or diagnosis of SARS-CoV-2 by FDA under an Emergency Use Authorization (EUA).  This EUA will remain in effect (meaning this test can be used) for the duration of the COVID-19 declaration under Section 564(b)(1) of the Act, 21 U.S.C. section 360bbb-3(b)(1), unless the authorization is terminated or revoked sooner. Performed at [facility], [place] [address], [place], [place] [address]   Urine culture     Status: Abnormal   Collection Time: 03/18/19 12:46 AM  Result Value Ref Range Status   Specimen Description URINE, CLEAN CATCH  Final   Special Requests NONE  Final   Culture (A)  Final    <10,000 COLONIES/mL INSIGNIFICANT GROWTH Performed at [facility], [address], [place], [place] [address]    Report Status 03/19/2019 FINAL  Final  Culture, blood (routine x 2) Call MD if unable to obtain prior to antibiotics being given     Status: None   Collection Time: 03/18/19  4:00 PM  Result Value Ref Range Status   Specimen Description   Final    BLOOD RIGHT ARM Performed at [facility] [address] [place], [place] [address]    Special Requests   Final    BOTTLES DRAWN AEROBIC ONLY Blood Culture results may not be optimal due to an inadequate volume of blood received in culture bottles   Culture   Final    NO GROWTH 5 DAYS Performed at [facility], [place] [address], [place], [place] [address]    Report Status 03/23/2019 FINAL  Final  Culture, blood (routine x 2) Call MD if unable to obtain prior to antibiotics being given     Status: None  Collection Time: 03/18/19  4:00 PM  Result Value Ref Range Status   Specimen Description   Final    BLOOD RIGHT HAND Performed at [facility] [address], [place], [place] [address]    Special  Requests   Final    BOTTLES DRAWN AEROBIC ONLY Blood Culture results may not be optimal due to an inadequate volume of blood received in culture bottles   Culture   Final    NO GROWTH 5 DAYS Performed at [facility], [place] [address] [place], [place] [address]    Report Status 03/23/2019 FINAL  Final  MRSA PCR Screening     Status: None   Collection Time: 03/19/19  2:00 PM  Result Value Ref Range Status   MRSA by PCR NEGATIVE NEGATIVE Final    Comment:        The GeneXpert MRSA Assay (FDA approved for NASAL specimens only), is one component of a comprehensive MRSA colonization surveillance program. It is not intended to diagnose MRSA infection nor to guide or monitor treatment for MRSA infections. Performed at [facility], [place] [address], [place], [place] [address]   Culture, respiratory (non-expectorated)     Status: None   Collection Time: 03/21/19  2:42 PM  Result Value Ref Range Status   Specimen Description   Final    TRACHEAL ASPIRATE Performed at [facility] [address] [place], [place] [address]    Special Requests   Final    NONE Performed at [facility], [place] [address] [place], [place] [address]    Gram Stain   Final    RARE WBC PRESENT,BOTH PMN AND MONONUCLEAR RARE YEAST Performed at [facility], [place] [address], [place], [place] [address]    Culture FEW CANDIDA ALBICANS  Final   Report Status 03/24/2019 FINAL  Final     Scheduled Meds: . chlorhexidine  15 mL Mouth/Throat BID  . Chlorhexidine Gluconate Cloth  6 each Topical Q0600  . famotidine  20 mg Per Tube Daily  . heparin injection (subcutaneous)  5,000 Units Subcutaneous Q8H  . insulin aspart  0-9 Units Subcutaneous Q4H  . insulin detemir  8 Units Subcutaneous BID  . mouth rinse  15 mL Mouth Rinse 10 times per day  . midodrine  2.5 mg Per Tube Q8H  . pneumococcal 23 valent vaccine  0.5 mL Intramuscular Tomorrow-1000      LOS: 6 days   Cherene Altes, MD [facility] Office  [phone] Pager - Text Page per Amion  If 7PM-7AM, please contact night-coverage per Amion 03/24/2019, 4:59 PM

## 2019-03-24 NOTE — Progress Notes (Signed)
NAMERodrigues Owen, MRN:  [id number], DOB:  1951/02/11, LOS: 6 ADMISSION DATE:  03/22/2019, CONSULTATION DATE: Mar 21, 2019 REFERRING MD: Dr. Thereasa Solo, CHIEF COMPLAINT: Dyspnea  Brief History   68 year old male admitted on May 19 with COVID-19 pneumonia.  Developed acute kidney injury and worsening hypoxemic respiratory failure requiring intubation and mechanical ventilation on Mar 21, 2019.  Past Medical History  CKD Cigarette smoker Hypertension Diabetes mellitus type 2  Significant Hospital Events     Consults:  PCCM  Procedures:  ET tube 5/23 >>  Right IJ CVC 5/23 >>   Significant Diagnostic Tests:  May 25 CT head no acute intracranial process  Micro Data:  SARS CoV2 5/19 >> positive Urine 5/20 >> insignificant growth Blood 5/19 >> 1 of 2 MRSE (? Contaminant) Blood 5/20 >>  Resp 5/23 >>   Antimicrobials:  Azithro 5/19 x 1 Ceftriaxone 5/19 x 1 Cefepime 5/20 >>  vanco 5/20 >> 5/22  Interim history/subjective:   Renal function worse Oxygenation slightly improved Still has periods of unresponsiveness after receiving narcotics  Objective   Blood pressure (!) 106/58, pulse 79, temperature (!) 97.5 F (36.4 C), temperature source Axillary, resp. rate 18, height 5\' 1"  (1.549 m), weight 61.1 kg, SpO2 93 %.    Vent Mode: PRVC FiO2 (%):  [40 %-50 %] 50 % Set Rate:  [28 bmp] 28 bmp Vt Set:  [310 mL] 310 mL PEEP:  [8 cmH20-10 cmH20] 8 cmH20 Plateau Pressure:  [18 cmH20] 18 cmH20   Intake/Output Summary (Last 24 hours) at 03/24/2019 0254 Last data filed at 03/24/2019 0600 Gross per 24 hour  Intake 2336.64 ml  Output 400 ml  Net 1936.64 ml   Filed Weights   03/18/19 0423 03/23/19 0500  Weight: 58.3 kg 61.1 kg    Examination:  General:  In bed on vent HENT: NCAT ETT in place PULM: CTA B, vent supported breathing CV: RRR, no mgr GI: BS+, soft, nontender MSK: normal bulk and tone Neuro: sedated on vent but will wake up, doesn't follow commands, he will move  all four extremities   Resolved Hospital Problem list     Assessment & Plan:  Acute respiratory failure with hypoxemia due to COVID-19: Hold remdesivir given renal failure S/p actemra and corticosteroids Continue full mechanical ventilatory support per ARDS protocol with 6 to 8 cc/kg tidal volume Titrate PEEP and FiO2 per ARDS table, goal SaO2 greater than 88% Ventilator associated pneumonia prevention protocol Change sedation to Precedex to see if this will allow him to comfortably tolerate a spontaneous breathing trial as at this point mental status seems to be more of a barrier to weaning then his pulmonary mechanics  Acute kidney injury: Labs worse, urine output decent this morning, however no indications for hemodialysis today Give volume resuscitation again today: 500 cc saline bolus followed by continuous infusion of saline Monitor urine output Monitor basic metabolic panel  Autonomic instability/hypotension: Shock resolved/chronic hypotension Monitor off of norepinephrine today Continue midodrine  Need for mechanical ventilation, sedation for ventilator synchrony: Intermittent periods of unresponsiveness: Felt to be related to narcotics, head CT unremarkable Stop fentanyl Start precedex  Best practice:  Diet: Tube feeding Pain/Anxiety/Delirium protocol (if indicated): yes, RASS -2 VAP protocol (if indicated): yes DVT prophylaxis: sub q heparin GI prophylaxis: famotidine Glucose control: SSI Mobility: bedrest Code Status: full Family Communication: per [facility] Disposition: remain in ICU  Labs   CBC: Recent Labs  Lab 03/20/19 0525 03/21/19 0419 03/21/19 2029 03/22/19 0341 03/22/19 0433 03/23/19 2706  03/24/19 0225  WBC 4.6 6.4  --  7.0  --  8.3 13.4*  NEUTROABS 4.2 5.8  --  6.4  --  7.4 12.5*  HGB 11.2* 10.9* 9.5* 9.7* 12.2* 9.2* 8.8*  HCT 34.4* 33.2* 28.0* 30.5* 36.0* 30.1* 28.4*  MCV 89.6 88.1  --  91.6  --  96.2 94.4  PLT 141* 156  --  122*  --  89* 71*     Basic Metabolic Panel: Recent Labs  Lab 03/20/19 0525  03/22/19 0341 03/22/19 0433 03/22/19 1830 03/23/19 0322 03/23/19 1431 03/24/19 0225  NA 138   < > 142 145  --  144 143 144  K 4.5   < > 4.6 4.7  --  4.6 4.9 4.4  CL 111  --  115*  --   --  116* 118* 121*  CO2 18*  --  21*  --   --  23 20* 19*  GLUCOSE 296*  --  155*  --   --  278* 273* 195*  BUN 59*  --  70*  --   --  93* 95* 98*  CREATININE 2.61*  --  2.50*  --   --  4.04* 4.09* 4.31*  CALCIUM 8.8*  --  8.7*  --   --  8.2* 7.9* 8.0*  MG  --   --   --   --  2.5* 2.5*  --  2.6*  PHOS  --   --   --   --  5.8* 6.1*  --  4.5   < > = values in this interval not displayed.   GFR: Estimated Creatinine Clearance: 12.1 mL/min (A) (by C-G formula based on SCr of 4.31 mg/dL (H)). Recent Labs  Lab 02/28/2019 2155 03/16/2019 2355 03/18/19 0118 03/18/19 1600  03/21/19 0419 03/22/19 0341 03/23/19 0322 03/24/19 0225  PROCALCITON  --   --  88.27  --   --   --   --   --   --   WBC  --   --   --   --    < > 6.4 7.0 8.3 13.4*  LATICACIDVEN 3.2* 1.5  --  2.1*  --   --   --   --   --    < > = values in this interval not displayed.    Liver Function Tests: Recent Labs  Lab 03/19/2019 2139 03/21/19 0419 03/22/19 0341 03/23/19 0322 03/24/19 0225  AST 50* 47* 37 35 30  ALT 19 24 21 20 20   ALKPHOS 59 106 73 79 89  BILITOT 0.7 0.8 0.5 0.3 0.2*  PROT 6.8 6.0* 5.8* 5.9* 5.4*  ALBUMIN 2.6* 2.2* 2.1* 2.0* 1.9*   Recent Labs  Lab 03/21/2019 2139  LIPASE 24   Recent Labs  Lab 03/24/19 0445  AMMONIA 39*    ABG    Component Value Date/Time   PHART 7.207 (L) 03/22/2019 0433   PCO2ART 50.6 (H) 03/22/2019 0433   PO2ART 102.0 03/22/2019 0433   HCO3 20.0 03/22/2019 0433   TCO2 22 03/22/2019 0433   ACIDBASEDEF 8.0 (H) 03/22/2019 0433   O2SAT 96.0 03/22/2019 0433     Coagulation Profile: No results for input(s): INR, PROTIME in the last 168 hours.  Cardiac Enzymes: Recent Labs  Lab 03/18/19 0118 03/22/19 0342  TROPONINI  0.04* <0.03    HbA1C: Hgb A1c MFr Bld  Date/Time Value Ref Range Status  03/18/2019 10:45 AM 14.9 (H) 4.8 - 5.6 % Final    Comment:    (  NOTE) Pre diabetes:          5.7%-6.4% Diabetes:              >6.4% Glycemic control for   <7.0% adults with diabetes   03/18/2019 01:18 AM 14.9 (H) 4.8 - 5.6 % Final    Comment:    (NOTE) Pre diabetes:          5.7%-6.4% Diabetes:              >6.4% Glycemic control for   <7.0% adults with diabetes     CBG: Recent Labs  Lab 03/23/19 1152 03/23/19 1611 03/23/19 2003 03/23/19 2311 03/24/19 0308  GLUCAP 215* 270* 259* 207* 163*       Critical care time: 35 minutes     Roselie Awkward, MD [place] [facility] Pager: [phone] Cell: [phone] If no response, call [phone]

## 2019-03-25 ENCOUNTER — Inpatient Hospital Stay (HOSPITAL_COMMUNITY): Payer: Medicare Other

## 2019-03-25 ENCOUNTER — Encounter (HOSPITAL_COMMUNITY): Payer: Self-pay | Admitting: Nephrology

## 2019-03-25 LAB — D-DIMER, QUANTITATIVE: D-Dimer, Quant: 2.91 ug/mL-FEU — ABNORMAL HIGH (ref 0.00–0.50)

## 2019-03-25 LAB — CBC WITH DIFFERENTIAL/PLATELET
Abs Immature Granulocytes: 0.09 10*3/uL — ABNORMAL HIGH (ref 0.00–0.07)
Basophils Absolute: 0.1 10*3/uL (ref 0.0–0.1)
Basophils Relative: 0 %
Eosinophils Absolute: 0.2 10*3/uL (ref 0.0–0.5)
Eosinophils Relative: 1 %
HCT: 29.5 % — ABNORMAL LOW (ref 39.0–52.0)
Hemoglobin: 8.8 g/dL — ABNORMAL LOW (ref 13.0–17.0)
Immature Granulocytes: 1 %
Lymphocytes Relative: 3 %
Lymphs Abs: 0.4 10*3/uL — ABNORMAL LOW (ref 0.7–4.0)
MCH: 28.6 pg (ref 26.0–34.0)
MCHC: 29.8 g/dL — ABNORMAL LOW (ref 30.0–36.0)
MCV: 95.8 fL (ref 80.0–100.0)
Monocytes Absolute: 0.1 10*3/uL (ref 0.1–1.0)
Monocytes Relative: 1 %
Neutro Abs: 14.7 10*3/uL — ABNORMAL HIGH (ref 1.7–7.7)
Neutrophils Relative %: 94 %
Platelets: 76 10*3/uL — ABNORMAL LOW (ref 150–400)
RBC: 3.08 MIL/uL — ABNORMAL LOW (ref 4.22–5.81)
RDW: 15.9 % — ABNORMAL HIGH (ref 11.5–15.5)
WBC: 15.5 10*3/uL — ABNORMAL HIGH (ref 4.0–10.5)
nRBC: 0.8 % — ABNORMAL HIGH (ref 0.0–0.2)

## 2019-03-25 LAB — COMPREHENSIVE METABOLIC PANEL
ALT: 21 U/L (ref 0–44)
AST: 33 U/L (ref 15–41)
Albumin: 1.9 g/dL — ABNORMAL LOW (ref 3.5–5.0)
Alkaline Phosphatase: 108 U/L (ref 38–126)
Anion gap: 4 — ABNORMAL LOW (ref 5–15)
BUN: 83 mg/dL — ABNORMAL HIGH (ref 8–23)
CO2: 18 mmol/L — ABNORMAL LOW (ref 22–32)
Calcium: 7.7 mg/dL — ABNORMAL LOW (ref 8.9–10.3)
Chloride: 124 mmol/L — ABNORMAL HIGH (ref 98–111)
Creatinine, Ser: 3.94 mg/dL — ABNORMAL HIGH (ref 0.61–1.24)
GFR calc Af Amer: 17 mL/min — ABNORMAL LOW (ref >60)
GFR calc non Af Amer: 15 mL/min — ABNORMAL LOW (ref >60)
Glucose, Bld: 286 mg/dL — ABNORMAL HIGH (ref 70–99)
Potassium: 4.9 mmol/L (ref 3.5–5.1)
Sodium: 146 mmol/L — ABNORMAL HIGH (ref 135–145)
Total Bilirubin: 0.4 mg/dL (ref 0.3–1.2)
Total Protein: 5.4 g/dL — ABNORMAL LOW (ref 6.5–8.1)

## 2019-03-25 LAB — FERRITIN: Ferritin: 226 ng/mL (ref 24–336)

## 2019-03-25 LAB — GLUCOSE, CAPILLARY
Glucose-Capillary: 251 mg/dL — ABNORMAL HIGH (ref 70–99)
Glucose-Capillary: 255 mg/dL — ABNORMAL HIGH (ref 70–99)
Glucose-Capillary: 194 mg/dL — ABNORMAL HIGH (ref 70–99)
Glucose-Capillary: 191 mg/dL — ABNORMAL HIGH (ref 70–99)
Glucose-Capillary: 171 mg/dL — ABNORMAL HIGH (ref 70–99)

## 2019-03-25 LAB — MAGNESIUM: Magnesium: 2.5 mg/dL — ABNORMAL HIGH (ref 1.7–2.4)

## 2019-03-25 LAB — C-REACTIVE PROTEIN: CRP: 5 mg/dL — ABNORMAL HIGH (ref <1.0)

## 2019-03-25 MED ORDER — PRISMASOL BGK 4/2.5 32-4-2.5 MEQ/L IV SOLN
INTRAVENOUS | Status: DC
  Administered 2019-03-26 – 2019-03-30 (×26): via INTRAVENOUS_CENTRAL

## 2019-03-25 MED ORDER — SODIUM CHLORIDE 0.9 % IV SOLN
INTRAVENOUS | Status: DC | PRN
  Administered 2019-03-25 – 2019-03-31 (×5): via INTRAVENOUS

## 2019-03-25 MED ORDER — HEPARIN SODIUM (PORCINE) 1000 UNIT/ML DIALYSIS
1000.0000 [IU] | INTRAMUSCULAR | Status: DC | PRN
  Administered 2019-03-27: 4000 [IU] via INTRAVENOUS_CENTRAL
  Administered 2019-03-28: 13:00:00 1000 [IU] via INTRAVENOUS_CENTRAL
  Administered 2019-03-31: 4000 [IU] via INTRAVENOUS_CENTRAL
  Filled 2019-03-25 (×2): qty 6
  Filled 2019-03-25: qty 4
  Filled 2019-03-25 (×4): qty 6

## 2019-03-25 MED ORDER — HEPARIN (PORCINE) 25000 UT/250ML-% IV SOLN: 500.0000 [IU]/h | INTRAVENOUS | Status: DC

## 2019-03-25 MED ORDER — PRISMASOL BGK 4/2.5 32-4-2.5 MEQ/L REPLACEMENT SOLN
Status: DC
  Administered 2019-03-26 – 2019-03-31 (×10): via INTRAVENOUS_CENTRAL

## 2019-03-25 MED ORDER — FUROSEMIDE 10 MG/ML IJ SOLN
160.0000 mg | Freq: Once | INTRAVENOUS | Status: AC
  Administered 2019-03-25: 13:00:00 160 mg via INTRAVENOUS
  Filled 2019-03-25: qty 16

## 2019-03-25 MED ORDER — INSULIN DETEMIR 100 UNIT/ML ~~LOC~~ SOLN
15.0000 [IU] | Freq: Two times a day (BID) | SUBCUTANEOUS | Status: DC
  Administered 2019-03-25 – 2019-03-30 (×10): 15 [IU] via SUBCUTANEOUS
  Filled 2019-03-25 (×13): qty 0.15

## 2019-03-25 MED ORDER — HEPARIN (PORCINE) 2000 UNITS/L FOR CRRT
INTRAVENOUS_CENTRAL | Status: DC | PRN
  Administered 2019-03-25 – 2019-03-27 (×4): via INTRAVENOUS_CENTRAL
  Filled 2019-03-25 (×7): qty 1000
  Filled 2019-03-25: qty 2000
  Filled 2019-03-25 (×3): qty 1000

## 2019-03-25 MED ORDER — SODIUM CHLORIDE 0.9 % IV SOLN
1000.0000 [IU]/h | INTRAVENOUS | Status: DC
  Administered 2019-03-25: 500 [IU]/h via INTRAVENOUS_CENTRAL
  Administered 2019-03-26 – 2019-03-31 (×13): 1000 [IU]/h via INTRAVENOUS_CENTRAL
  Filled 2019-03-25: qty 2
  Filled 2019-03-25 (×2): qty 10000
  Filled 2019-03-25: qty 2
  Filled 2019-03-25: qty 10000
  Filled 2019-03-25: qty 2
  Filled 2019-03-25 (×7): qty 10000
  Filled 2019-03-25: qty 2

## 2019-03-25 MED ORDER — PRISMASOL BGK 4/2.5 32-4-2.5 MEQ/L REPLACEMENT SOLN
Status: DC
  Administered 2019-03-26 – 2019-03-31 (×7): via INTRAVENOUS_CENTRAL

## 2019-03-25 MED ORDER — INSULIN ASPART 100 UNIT/ML ~~LOC~~ SOLN
0.0000 [IU] | SUBCUTANEOUS | Status: DC
  Administered 2019-03-25 (×3): 3 [IU] via SUBCUTANEOUS
  Administered 2019-03-26 (×3): 2 [IU] via SUBCUTANEOUS
  Administered 2019-03-26 (×2): 3 [IU] via SUBCUTANEOUS
  Administered 2019-03-27: 2 [IU] via SUBCUTANEOUS
  Administered 2019-03-27: 3 [IU] via SUBCUTANEOUS
  Administered 2019-03-27 – 2019-03-28 (×3): 2 [IU] via SUBCUTANEOUS
  Administered 2019-03-29: 3 [IU] via SUBCUTANEOUS
  Administered 2019-03-30 (×2): 2 [IU] via SUBCUTANEOUS

## 2019-03-25 NOTE — Procedures (Signed)
Central Venous Catheter Insertion Procedure Note Brian Owen [id number] 1951/01/13  Procedure: Insertion of Central Venous Catheter Indications: Hemodialysis  Procedure Details Consent: Risks of procedure as well as the alternatives and risks of each were explained to the (patient/caregiver).  Consent for procedure obtained. Time Out: Verified patient identification, verified procedure, site/side was marked, verified correct patient position, special equipment/implants available, medications/allergies/relevent history reviewed, required imaging and test results available.  Performed  Maximum sterile technique was used including antiseptics, cap, gloves, gown, hand hygiene, mask and sheet. Skin prep: Chlorhexidine; local anesthetic administered A antimicrobial bonded/coated triple lumen catheter was placed in the left internal jugular vein using the Seldinger technique.  Ultrasound was used to verify the patency of the vein and for real time needle guidance.  Evaluation Blood flow good Complications: No apparent complications Patient did tolerate procedure well. Chest X-ray ordered to verify placement.  CXR: pending.  Brian Owen 03/25/2019, 5:22 PM

## 2019-03-25 NOTE — Progress Notes (Signed)
[place] for CRRT medication adjustment  No Known Allergies  Patient Measurements: Height: 5\' 1"  (154.9 cm) Weight: 116 lb 4.8 oz (52.8 kg) IBW/kg (Calculated) : 52.3  Vital Signs: Temp: 96.6 F (35.9 C) (05/27 1630) Temp Source: Axillary (05/27 1630) BP: 123/70 (05/27 1800) Pulse Rate: 84 (05/27 1815) Intake/Output from previous day: 05/26 0701 - 05/27 0700 In: 2699.6 [I.V.:1483.1; NG/GT:1204; IV Piggyback:12.6] Out: 672 [Urine:671; Stool:1] Intake/Output from this shift: No intake/output data recorded.  Labs: Recent Labs    03/23/19 0322  03/24/19 0225 03/24/19 1442 03/25/19 0230  WBC 8.3  --  13.4*  --  15.5*  HGB 9.2*  --  8.8*  --  8.8*  HCT 30.1*  --  28.4*  --  29.5*  PLT 89*  --  71*  --  76*  CREATININE 4.04*   < > 4.31* 4.07* 3.94*  MG 2.5*  --  2.6*  --  2.5*  PHOS 6.1*  --  4.5  --   --   ALBUMIN 2.0*  --  1.9*  --  1.9*  PROT 5.9*  --  5.4*  --  5.4*  AST 35  --  30  --  33  ALT 20  --  20  --  21  ALKPHOS 79  --  89  --  108  BILITOT 0.3  --  0.2*  --  0.4   < > = values in this interval not displayed.   Estimated Creatinine Clearance: 13.3 mL/min (A) (by C-G formula based on SCr of 3.94 mg/dL (H)).   Medical History: Past Medical History:  Diagnosis Date  . Bullae 11/11/2015   legs/notes 11/11/2015  . Chronic kidney disease   . Heavy cigarette smoker   . Hypertension   . Parasite infection 2016   "couldn't afford to have surgery done; just left it; lower legs"  . Uncontrolled type II diabetes mellitus ([place]) dx'd 2013   Assessment: 68 YOM admitted for COVID PNA with AKI on CKD not responding to Lasix. Pharmacy consulted to adjust medication dosing for CRRT.   Plan:  - No medications require adjustment at present.  - Pharmacy will continue to follow and adjust as necessary  Napoleon Form 03/25/2019,7:15 PM

## 2019-03-25 NOTE — Consult Note (Signed)
Renal Service Consult Note [facility]  Cuahutemoc Raschke 03/25/2019  D  Requesting Physician:    Reason for Consult:  AKI on CKD HPI: The patient is a 68 y.o. year-old w/ hx of DM2, HTN, tobacco and CKD 3/4 presented on 5/19 with c/o chills, fever, lethargy and hypotension. He tested + for COVID 19 , CXR showed bilat infiltrates and pt admitted for viral PNA.  Since admission renal fxn improved from creat 3.0 to 2.5 then worsened again up to 4.0 yest and 3.94 today.  Asked to see for renal failure.   History obtained from MD's on team.     date  Creat  eGFR  2009- 2016 0.6- 1.0  2017- 2019 1.2- 1.06 Nov 2018 2.0- 2.7 27- 34  Feb 2020 2.0- 2.3 33- 38  03/13/19 2.18  02/28/2019 3.36  03/20/19 2.61  03/23/19 4.09  03/25/19 3.94  ROS = n/a   Past Medical History  Past Medical History:  Diagnosis Date  . Bullae 11/11/2015   legs/notes 11/11/2015  . Chronic kidney disease   . Heavy cigarette smoker   . Hypertension   . Parasite infection 2016   "couldn't afford to have surgery done; just left it; lower legs"  . Uncontrolled type II diabetes mellitus ([facility]) dx'd 2013   Past Surgical History  Past Surgical History:  Procedure Laterality Date  . NO PAST SURGERIES     Family History  Family History  Problem Relation Age of Onset  . Diabetes Other   . Diabetes Other    Social History  reports that he quit smoking about 5 years ago. His smoking use included cigarettes. He has a 72.00 pack-year smoking history. He has never used smokeless tobacco. He reports that he does not drink alcohol or use drugs. Allergies No Known Allergies Home medications Prior to Admission medications   Medication Sig Start Date End Date Taking? Authorizing Provider  acetaminophen (TYLENOL) 500 MG tablet Take 500 mg by mouth 2 (two) times daily.   Yes [provider]  insulin glargine (LANTUS) 100 UNIT/ML injection Inject 0.09 mLs (9 Units total) into the skin at bedtime.  03/13/19  Yes Isaacs, Cameron, MD  ACCU-CHEK SOFTCLIX LANCETS lancets 1 each by Other route 3 (three) times daily. 08/13/18   Butler, Michael C, MD  Blood Glucose Monitoring Suppl (ACCU-CHEK AVIVA PLUS) w/Device KIT 1 Device by Does not apply route 3 (three) times daily after meals. 08/13/18   Butler, Michael C, MD  glucose blood (ACCU-CHEK AVIVA PLUS) test strip 1 each by Other route 3 (three) times daily. 03/13/19   Isaacs, Cameron, MD   Liver Function Tests Recent Labs  Lab 03/23/19 0322 03/24/19 0225 03/25/19 0230  AST 35 30 33  ALT 20 20 21  ALKPHOS 79 89 108  BILITOT 0.3 0.2* 0.4  PROT 5.9* 5.4* 5.4*  ALBUMIN 2.0* 1.9* 1.9*   No results for input(s): LIPASE, AMYLASE in the last 168 hours. CBC Recent Labs  Lab 03/23/19 0322 03/24/19 0225 03/25/19 0230  WBC 8.3 13.4* 15.5*  NEUTROABS 7.4 12.5* 14.7*  HGB 9.2* 8.8* 8.8*  HCT 30.1* 28.4* 29.5*  MCV 96.2 94.4 95.8  PLT 89* 71* 76*   Basic Metabolic Panel Recent Labs  Lab 03/20/19 0525  03/22/19 0341 03/22/19 0433 03/22/19 1830 03/23/19 0322 03/23/19 1431 03/24/19 0225 03/24/19 1442 03/25/19 0230  NA 138   < > 142 145  --  144 143 144 145 146*  K 4.5   < >   4.6 4.7  --  4.6 4.9 4.4 4.4 4.9  CL 111  --  115*  --   --  116* 118* 121* 123* 124*  CO2 18*  --  21*  --   --  23 20* 19* 18* 18*  GLUCOSE 296*  --  155*  --   --  278* 273* 195* 229* 286*  BUN 59*  --  70*  --   --  93* 95* 98* 100* 83*  CREATININE 2.61*  --  2.50*  --   --  4.04* 4.09* 4.31* 4.07* 3.94*  CALCIUM 8.8*  --  8.7*  --   --  8.2* 7.9* 8.0* 7.7* 7.7*  PHOS  --   --   --   --  5.8* 6.1*  --  4.5  --   --    < > = values in this interval not displayed.   Iron/TIBC/Ferritin/ %Sat    Component Value Date/Time   IRON 59 05/15/2014 0538   TIBC 163 (L) 05/15/2014 0538   FERRITIN 226 03/25/2019 0230   IRONPCTSAT 36 05/15/2014 0538    Vitals:   03/25/19 1330 03/25/19 1400 03/25/19 1430 03/25/19 1514  BP: (!) 111/54 (!) 110/53 (!) 107/52 (!)  101/44  Pulse: 85 74 76 67  Resp: 13 19 19 (!) 30  Temp:      TempSrc:      SpO2: 95% 97% 95% 96%  Weight:      Height:       Exam  Patient not examined directly given COVID-19 + status, utilizing exam of the primary team and observations of RN's.    Home meds:  - insulin glargine 9u qd    UA 5/15 > 30 prot, 0-5 rbc/ wbc  UA 5/20 > 100 prot, 0-5 rbc/ wbc  Renal US 5/26 - IMPRESSION: Right nephrolithiasis.  Benign-appearing cyst in the left kidney measures 2.2 cm.  No evidence of hydronephrosis. Foley catheter decompresses the bladder.  I/O cumulative +14L and -4.2L, net +9.8 L   CVP - 8  CXR 5/19 > Worsening hazy multifocal airspace opacities concerning for progression of viral pneumonia  CXR 5/24 > Patchy multilobar pneumonia with slight improved aeration in the left lung and slightly worsened aeration in the right lung...  CXR 5/27 > Increased interstitial and airspace disease compatible with infection and ARDS   Assessment: 1. AKI on CKD 3/4 -baseline creat 2.0- 2.4. UOP has declined some here. CXR's worse in appearance of edema. Creat ~4 and eGFR around 15. Did not respond to IV lasix 160 mg today.  Will likely need CRRT if aggressive approach is desired, will d/w [facility] and CCM about timing.   2. COVID + viral PNA 3. IDDM 4. Tobacco smoker 5. Met acidosis - from A/CRF    Plan: as above.      Rob   MD 03/25/2019, 4:31 PM   

## 2019-03-25 NOTE — Progress Notes (Addendum)
Pts weight showed significant decrease from previous charted weight. 52.753 kg with regular bedding (fitted sheet, flat sheet, bed pad, blanket, 1 pillow, and gown) down from 61 kg at last weight.

## 2019-03-25 NOTE — Progress Notes (Signed)
[facility]  I called Mr. Brian Owen's daughter and spoke to both her and her husband in a phone conversation letting them know what was going on with his renal function.  I explained we needed to move forward with hemodialysis if we wanted to continue aggressive care.  They voiced understanding and gave consent for hemodialysis as well as hemodialysis catheter placement.  Brian Awkward, MD [place] [facility] Pager: [phone] Cell: [phone] If no response, call [phone]

## 2019-03-25 NOTE — Progress Notes (Signed)
NAMEAlecxis Owen, MRN:  [id number], DOB:  1951/07/19, LOS: 7 ADMISSION DATE:  03/25/2019, CONSULTATION DATE: Mar 21, 2019 REFERRING MD: Dr. Thereasa Solo, CHIEF COMPLAINT: Dyspnea  Brief History   68 year old male admitted on May 19 with COVID-19 pneumonia.  Developed acute kidney injury and worsening hypoxemic respiratory failure requiring intubation and mechanical ventilation on Mar 21, 2019.  Past Medical History  CKD Cigarette smoker Hypertension Diabetes mellitus type 2  Significant Hospital Events     Consults:  PCCM  Procedures:  ET tube 5/23 >>  Right IJ CVC 5/23 >>   Significant Diagnostic Tests:  May 25 CT head no acute intracranial process  Micro Data:  SARS CoV2 5/19 >> positive Urine 5/20 >> insignificant growth Blood 5/19 >> 1 of 2 MRSE (? Contaminant) Blood 5/20 >>  Resp 5/23 >>   Antimicrobials:  Azithro 5/19 x 1 Ceftriaxone 5/19 x 1 Cefepime 5/20 >>  vanco 5/20 >> 5/22  Interim history/subjective:   Minimal urine output Worsening oxygenation, chest x-ray findings Intermittent periods of agitation, continues to have significant encephalopathy after receiving low doses of as needed narcotic while agitated  Objective   Blood pressure (!) 110/56, pulse 81, temperature (!) 97.5 F (36.4 C), temperature source Axillary, resp. rate 19, height 5\' 1"  (1.549 m), weight 52.8 kg, SpO2 96 %.    Vent Mode: PRVC FiO2 (%):  [70 %-100 %] 70 % Set Rate:  [28 bmp] 28 bmp Vt Set:  [310 mL] 310 mL PEEP:  [5 cmH20-14 cmH20] 14 cmH20 Plateau Pressure:  [14 cmH20-22 cmH20] 22 cmH20   Intake/Output Summary (Last 24 hours) at 03/25/2019 0831 Last data filed at 03/25/2019 0800 Gross per 24 hour  Intake 2621.2 ml  Output 679 ml  Net 1942.2 ml   Filed Weights   03/18/19 0423 03/23/19 0500 03/25/19 0533  Weight: 58.3 kg 61.1 kg 52.8 kg    Examination:  General:  In bed on vent HENT: NCAT ETT in place PULM: CTA B, vent supported breathing CV: RRR, no mgr GI: BS+,  soft, nontender MSK: normal bulk and tone Neuro: sedated on vent    Resolved Hospital Problem list     Assessment & Plan:  Acute respiratory failure with hypoxemia due to COVID-19: Chest x-ray findings and oxygenation worsening on May 27 which I think is due to acute pulmonary edema Continue full mechanical ventilatory support with 6 to 8 cc/per kilogram tidal volume Titrate PEEP and FiO2 per ARDS stable, goal SaO2 greater than 88% Ventilator associated pneumonia prevention protocol Hold wake-up assessment given worsening ventilator needs Discuss volume removal with renal  Acute kidney injury: give oliguria and worsening CXR findings, I suspect he has acute pulm edema Discussed with renal, likely need CVVHD Monitor urine output Monitor labs  Autonomic instability/hypotension: Shock resolved/chronic hypotension again worse 5/27 but no clear sign of sepsis Resume norepinehrpine titrated to mean arterial pressure greater than 65 Continue Midrin  Need for mechanical ventilation, sedation for ventilator synchrony: Intermittent periods of unresponsiveness: Felt to be related to narcotics, head CT unremarkable Stop dilaudid infusion Use versed prn agitation  Best practice:  Diet: Tube feeding Pain/Anxiety/Delirium protocol (if indicated): yes, RASS-1 to -2 VAP protocol (if indicated): yes DVT prophylaxis: sub q heparin GI prophylaxis: famotidine Glucose control: SSI Mobility: bedrest Code Status: full Family Communication: per [facility] Disposition: remain in ICU  Labs   CBC: Recent Labs  Lab 03/21/19 0419  03/22/19 0341 03/22/19 0433 03/23/19 0322 03/24/19 0225 03/25/19 0230  WBC 6.4  --  7.0  --  8.3 13.4* 15.5*  NEUTROABS 5.8  --  6.4  --  7.4 12.5* 14.7*  HGB 10.9*   < > 9.7* 12.2* 9.2* 8.8* 8.8*  HCT 33.2*   < > 30.5* 36.0* 30.1* 28.4* 29.5*  MCV 88.1  --  91.6  --  96.2 94.4 95.8  PLT 156  --  122*  --  89* 71* 76*   < > = values in this interval not displayed.     Basic Metabolic Panel: Recent Labs  Lab 03/22/19 1830 03/23/19 0322 03/23/19 1431 03/24/19 0225 03/24/19 1442 03/25/19 0230  NA  --  144 143 144 145 146*  K  --  4.6 4.9 4.4 4.4 4.9  CL  --  116* 118* 121* 123* 124*  CO2  --  23 20* 19* 18* 18*  GLUCOSE  --  278* 273* 195* 229* 286*  BUN  --  93* 95* 98* 100* 83*  CREATININE  --  4.04* 4.09* 4.31* 4.07* 3.94*  CALCIUM  --  8.2* 7.9* 8.0* 7.7* 7.7*  MG 2.5* 2.5*  --  2.6*  --  2.5*  PHOS 5.8* 6.1*  --  4.5  --   --    GFR: Estimated Creatinine Clearance: 13.3 mL/min (A) (by C-G formula based on SCr of 3.94 mg/dL (H)). Recent Labs  Lab 03/18/19 1600  03/22/19 0341 03/23/19 0322 03/24/19 0225 03/25/19 0230  WBC  --    < > 7.0 8.3 13.4* 15.5*  LATICACIDVEN 2.1*  --   --   --   --   --    < > = values in this interval not displayed.    Liver Function Tests: Recent Labs  Lab 03/21/19 0419 03/22/19 0341 03/23/19 0322 03/24/19 0225 03/25/19 0230  AST 47* 37 35 30 33  ALT 24 21 20 20 21   ALKPHOS 106 73 79 89 108  BILITOT 0.8 0.5 0.3 0.2* 0.4  PROT 6.0* 5.8* 5.9* 5.4* 5.4*  ALBUMIN 2.2* 2.1* 2.0* 1.9* 1.9*   No results for input(s): LIPASE, AMYLASE in the last 168 hours. Recent Labs  Lab 03/24/19 0445  AMMONIA 39*    ABG    Component Value Date/Time   PHART 7.207 (L) 03/22/2019 0433   PCO2ART 50.6 (H) 03/22/2019 0433   PO2ART 102.0 03/22/2019 0433   HCO3 20.0 03/22/2019 0433   TCO2 22 03/22/2019 0433   ACIDBASEDEF 8.0 (H) 03/22/2019 0433   O2SAT 96.0 03/22/2019 0433     Coagulation Profile: No results for input(s): INR, PROTIME in the last 168 hours.  Cardiac Enzymes: Recent Labs  Lab 03/22/19 0342  TROPONINI <0.03    HbA1C: Hgb A1c MFr Bld  Date/Time Value Ref Range Status  03/18/2019 10:45 AM 14.9 (H) 4.8 - 5.6 % Final    Comment:    (NOTE) Pre diabetes:          5.7%-6.4% Diabetes:              >6.4% Glycemic control for   <7.0% adults with diabetes   03/18/2019 01:18 AM 14.9 (H)  4.8 - 5.6 % Final    Comment:    (NOTE) Pre diabetes:          5.7%-6.4% Diabetes:              >6.4% Glycemic control for   <7.0% adults with diabetes     CBG: Recent Labs  Lab 03/24/19 1558 03/24/19 2043 03/24/19 2309 03/25/19 0350 03/25/19 0731  [place]       Critical care time: 31 minutes     Brian Awkward, MD [place] [facility] Pager: [phone] Cell: [phone] If no response, call [phone]

## 2019-03-25 NOTE — Progress Notes (Signed)
Avi Kerschner  JAS:[id number] DOB: 11-Aug-1951 DOA: 03/10/2019 PCP: Alfonse Spruce, FNP    Brief Narrative:  41PF w/ a hx of HTN, DM, CKD Stage 3, and multiple prior admissions for orthostatic hypotension related to autonomic dysfunction (noncompliant w/ midodrine) who presented w/ fever generalized weakness hypotension and tachypnea after not feeling well in general for a week. He was seen on 5/17 for orthostatic hypotension and was given fluids started and midodrine and discharged home.  In the ED he was found to be hyperglycemic and febrile with a creatinine of 3.3 (baseline 2.0) and a CXR noting multifocal interstitial opacities.    Significant Events: 5/19 admit  5/22 saturations declining 5/23 transferred to ICU - intubated - R IJ CVL  COVID-19 specific Treatment: Actemra 5/21 & 5/23 Steroids 5/21 > 5/23  Ventilator Settings: Vent Mode: PRVC FiO2 (%):  [60 %-100 %] 60 % Set Rate:  [28 bmp] 28 bmp Vt Set:  [310 mL] 310 mL PEEP:  [5 cmH20-14 cmH20] 14 cmH20 Plateau Pressure:  [22 cmH20] 22 cmH20  Subjective:  She continues to have these intermittent episodes where he becomes hypotensive, bradycardic, lastS for few minutes, then resolves without intervention.  Assessment & Plan:  Acute hypoxic respiratory failure -COVID-19 pneumonia -sepsis - vent Management per PCCM, did have increased vent requirement, currently on PEEP of 14, FiO2 of 70, chest x-ray with worsening bilateral opacities, possibly developing volume overload, please discussion below under kidney injury. -Minimize sedation, on PRN Dilaudid. - Has received 2 doses of actemra  - not a candidate for remdisivir due to renal failure - he is started on pressors for hypotension -Need to trend confirmatory markers closely, overall trending down  Recent Labs    03/23/19 0322 03/24/19 0225 03/25/19 0230  DDIMER 2.68* 2.74* 2.91*  FERRITIN 364* 319 226  CRP 6.5* 4.2* 5.0*    Acute kidney injury on CKD  Baseline crt 2.0 , currently at 4, urine output is minimal, 650 cc over last 24 hours, only 15 cc today . -Started to develop signs of overload on imaging likely contributing to his worsening respiratory failure . -Discussed with renal, evidence with 160 mg IV Lasix today, if no urine output will need CRRT .   Recent Labs  Lab 03/23/19 0322 03/23/19 1431 03/24/19 0225 03/24/19 1442 03/25/19 0230  CREATININE 4.04* 4.09* 4.31* 4.07* 3.94*   Autonomic dysfunction /chronic orthostatic hypotension -Cont midodrine per tube -Started on pressors, he has some orthostatic hypotension, given his worsening renal failure, will aim for XTK>24  Metabolic encephalopathy  -No acute finding  DM2  -Poorly controlled, his Levemir from 8 to 15 units twice daily, and increased his sliding scale to resistant .  Mildly elevated troponin Denied chest pain when alert - likely related to renal dysfunction and hypotension - Troponin normal in follow-up  Positive blood culture - Coag neg Staph + Diptheroids (1 of 2 blood cx) Clinically most consistent with contamination - antibiotics not indicated for this issue at present  DVT prophylaxis: Lovenox Code Status: FULL CODE Family Communication: will call his daughter Today [phone] Disposition Plan: ICU  Consultants:  none  Antimicrobials:  Azithromycin 5/19 Ceftriaxone 5/19 Cefepime 5/20 >5/26 Vancomycin 5/20 > 5/22  Objective: Blood pressure (!) 93/45, pulse 65, temperature (!) 97.5 F (36.4 C), temperature source Axillary, resp. rate 20, height 5\' 1"  (1.549 m), weight 52.8 kg, SpO2 93 %.  Intake/Output Summary (Last 24 hours) at 03/25/2019 1257 Last data filed at 03/25/2019 1200 Gross per 24  hour  Intake 2868.36 ml  Output 721 ml  Net 2147.36 ml   Filed Weights   03/18/19 0423 03/23/19 0500 03/25/19 0533  Weight: 58.3 kg 61.1 kg 52.8 kg    Examination:  Chronically ill-appearing male, laying in no apparent distress, ETT/OGT in  place. Supported respiratory sounds bilaterally, with diffuse fine crackles Regular rate and rhythm, no rubs or murmurs Abdomen soft, bowel sounds present, nontender Ext  with no edema, clubbing or cyanosis   CBC: Recent Labs  Lab 03/23/19 0322 03/24/19 0225 03/25/19 0230  WBC 8.3 13.4* 15.5*  NEUTROABS 7.4 12.5* 14.7*  HGB 9.2* 8.8* 8.8*  HCT 30.1* 28.4* 29.5*  MCV 96.2 94.4 95.8  PLT 89* 71* 76*   Basic Metabolic Panel: Recent Labs  Lab 03/22/19 1830 03/23/19 0322  03/24/19 0225 03/24/19 1442 03/25/19 0230  NA  --  144   < > 144 145 146*  K  --  4.6   < > 4.4 4.4 4.9  CL  --  116*   < > 121* 123* 124*  CO2  --  23   < > 19* 18* 18*  GLUCOSE  --  278*   < > 195* 229* 286*  BUN  --  93*   < > 98* 100* 83*  CREATININE  --  4.04*   < > 4.31* 4.07* 3.94*  CALCIUM  --  8.2*   < > 8.0* 7.7* 7.7*  MG 2.5* 2.5*  --  2.6*  --  2.5*  PHOS 5.8* 6.1*  --  4.5  --   --    < > = values in this interval not displayed.   GFR: Estimated Creatinine Clearance: 13.3 mL/min (A) (by C-G formula based on SCr of 3.94 mg/dL (H)).  Liver Function Tests: Recent Labs  Lab 03/22/19 0341 03/23/19 0322 03/24/19 0225 03/25/19 0230  AST 37 35 30 33  ALT 21 20 20 21   ALKPHOS 73 79 89 108  BILITOT 0.5 0.3 0.2* 0.4  PROT 5.8* 5.9* 5.4* 5.4*  ALBUMIN 2.1* 2.0* 1.9* 1.9*   No results for input(s): LIPASE, AMYLASE in the last 168 hours.  Cardiac Enzymes: Recent Labs  Lab 03/22/19 0342  TROPONINI <0.03    HbA1C: Hgb A1c MFr Bld  Date/Time Value Ref Range Status  03/18/2019 10:45 AM 14.9 (H) 4.8 - 5.6 % Final    Comment:    (NOTE) Pre diabetes:          5.7%-6.4% Diabetes:              >6.4% Glycemic control for   <7.0% adults with diabetes   03/18/2019 01:18 AM 14.9 (H) 4.8 - 5.6 % Final    Comment:    (NOTE) Pre diabetes:          5.7%-6.4% Diabetes:              >6.4% Glycemic control for   <7.0% adults with diabetes     CBG: Recent Labs  Lab 03/24/19 2043  03/24/19 2309 03/25/19 0350 03/25/19 0731 03/25/19 1142  GLUCAP 221* 237* 251* 255* 194*    Recent Results (from the past 240 hour(s))  Blood Culture (routine x 2)     Status: None   Collection Time: 03/18/2019  8:24 PM  Result Value Ref Range Status   Specimen Description BLOOD LEFT FOREARM  Final   Special Requests   Final    BOTTLES DRAWN AEROBIC ONLY Blood Culture adequate volume   Culture  Final    NO GROWTH 5 DAYS Performed at [facility], [place] [address], [place], [place] [address]    Report Status 03/22/2019 FINAL  Final  Blood Culture (routine x 2)     Status: Abnormal   Collection Time: 03/29/2019 10:03 PM  Result Value Ref Range Status   Specimen Description BLOOD RIGHT IV  Final   Special Requests   Final    BOTTLES DRAWN AEROBIC AND ANAEROBIC Blood Culture results may not be optimal due to an excessive volume of blood received in culture bottles   Culture  Setup Time   Final    GRAM POSITIVE COCCI ANAEROBIC BOTTLE ONLY Organism ID to follow CRITICAL RESULT CALLED TO, READ BACK BY AND VERIFIED WITH: Jene Every PharmD 16:05 03/18/19 ([email or web address]) AEROBIC BOTTLE ONLY GRAM POSITIVE RODS CRITICAL RESULT CALLED TO, READ BACK BY AND VERIFIED WITH: E WILLIAMSON PHARMD 03/18/19 2128 JDW    Culture (A)  Final    STAPHYLOCOCCUS SPECIES (COAGULASE NEGATIVE) DIPHTHEROIDS(CORYNEBACTERIUM SPECIES) THE SIGNIFICANCE OF ISOLATING THIS ORGANISM FROM A SINGLE SET OF BLOOD CULTURES WHEN MULTIPLE SETS ARE DRAWN IS UNCERTAIN. PLEASE NOTIFY THE MICROBIOLOGY DEPARTMENT WITHIN ONE WEEK IF SPECIATION AND SENSITIVITIES ARE REQUIRED. Performed at [facility], [place] [address], [place], [place] [address]    Report Status 03/19/2019 FINAL  Final  Blood Culture ID Panel (Reflexed)     Status: Abnormal   Collection Time: 03/11/2019 10:03 PM  Result Value Ref Range Status   Enterococcus species NOT DETECTED NOT DETECTED Final   Listeria monocytogenes NOT DETECTED NOT DETECTED Final    Staphylococcus species DETECTED (A) NOT DETECTED Final    Comment: Methicillin (oxacillin) resistant coagulase negative staphylococcus. Possible blood culture contaminant (unless isolated from more than one blood culture draw or clinical case suggests pathogenicity). No antibiotic treatment is indicated for blood  culture contaminants. CRITICAL RESULT CALLED TO, READ BACK BY AND VERIFIED WITH: Jene Every PharmD 16:05 03/18/19 ([email or web address])    Staphylococcus aureus (BCID) NOT DETECTED NOT DETECTED Final   Methicillin resistance DETECTED (A) NOT DETECTED Final    Comment: CRITICAL RESULT CALLED TO, READ BACK BY AND VERIFIED WITH: Jene Every PharmD 16:05 03/18/19 ([email or web address])    Streptococcus species NOT DETECTED NOT DETECTED Final   Streptococcus agalactiae NOT DETECTED NOT DETECTED Final   Streptococcus pneumoniae NOT DETECTED NOT DETECTED Final   Streptococcus pyogenes NOT DETECTED NOT DETECTED Final   Acinetobacter baumannii NOT DETECTED NOT DETECTED Final   Enterobacteriaceae species NOT DETECTED NOT DETECTED Final   Enterobacter cloacae complex NOT DETECTED NOT DETECTED Final   Escherichia coli NOT DETECTED NOT DETECTED Final   Klebsiella oxytoca NOT DETECTED NOT DETECTED Final   Klebsiella pneumoniae NOT DETECTED NOT DETECTED Final   Proteus species NOT DETECTED NOT DETECTED Final   Serratia marcescens NOT DETECTED NOT DETECTED Final   Haemophilus influenzae NOT DETECTED NOT DETECTED Final   Neisseria meningitidis NOT DETECTED NOT DETECTED Final   Pseudomonas aeruginosa NOT DETECTED NOT DETECTED Final   Candida albicans NOT DETECTED NOT DETECTED Final   Candida glabrata NOT DETECTED NOT DETECTED Final   Candida krusei NOT DETECTED NOT DETECTED Final   Candida parapsilosis NOT DETECTED NOT DETECTED Final   Candida tropicalis NOT DETECTED NOT DETECTED Final    Comment: Performed at [facility], [place] [address], [place], [place] [address]  SARS Coronavirus 2 (CEPHEID- Performed in [facility]  hospital lab), Hosp Order     Status: Abnormal   Collection Time: 03/24/2019  11:52 PM  Result Value Ref Range Status   SARS Coronavirus 2 POSITIVE (A) NEGATIVE Final    Comment: RESULT CALLED TO, READ BACK BY AND VERIFIED WITH: P JOHNSTON RN 03/17/18 0131 JDW (NOTE) If result is NEGATIVE SARS-CoV-2 target nucleic acids are NOT DETECTED. The SARS-CoV-2 RNA is generally detectable in upper and lower  respiratory specimens during the acute phase of infection. The lowest  concentration of SARS-CoV-2 viral copies this assay can detect is 250  copies / mL. A negative result does not preclude SARS-CoV-2 infection  and should not be used as the sole basis for treatment or other  patient management decisions.  A negative result may occur with  improper specimen collection / handling, submission of specimen other  than nasopharyngeal swab, presence of viral mutation(s) within the  areas targeted by this assay, and inadequate number of viral copies  (<250 copies / mL). A negative result must be combined with clinical  observations, patient history, and epidemiological information. If result is POSITIVE SARS-CoV-2 target nucleic acids are DETECTED. The SA RS-CoV-2 RNA is generally detectable in upper and lower  respiratory specimens during the acute phase of infection.  Positive  results are indicative of active infection with SARS-CoV-2.  Clinical  correlation with patient history and other diagnostic information is  necessary to determine patient infection status.  Positive results do  not rule out bacterial infection or co-infection with other viruses. If result is PRESUMPTIVE POSTIVE SARS-CoV-2 nucleic acids MAY BE PRESENT.   A presumptive positive result was obtained on the submitted specimen  and confirmed on repeat testing.  While 2019 novel coronavirus  (SARS-CoV-2) nucleic acids may be present in the submitted sample  additional confirmatory testing may be necessary for epidemiological   and / or clinical management purposes  to differentiate between  SARS-CoV-2 and other Sarbecovirus currently known to infect humans.  If clinically indicated additional testing with an alternate test  methodology [id number]) is advi sed. The SARS-CoV-2 RNA is generally  detectable in upper and lower respiratory specimens during the acute  phase of infection. The expected result is Negative. Fact Sheet for Patients:  StrictlyIdeas.no Fact Sheet for Healthcare Providers: BankingDealers.co.za This test is not yet approved or cleared by the [place] FDA and has been authorized for detection and/or diagnosis of SARS-CoV-2 by FDA under an Emergency Use Authorization (EUA).  This EUA will remain in effect (meaning this test can be used) for the duration of the COVID-19 declaration under Section 564(b)(1) of the Act, 21 U.S.C. section 360bbb-3(b)(1), unless the authorization is terminated or revoked sooner. Performed at [facility], [place] [address], [place], [place] [address]   Urine culture     Status: Abnormal   Collection Time: 03/18/19 12:46 AM  Result Value Ref Range Status   Specimen Description URINE, CLEAN CATCH  Final   Special Requests NONE  Final   Culture (A)  Final    <10,000 COLONIES/mL INSIGNIFICANT GROWTH Performed at [facility], [address] [place], [place] [address]    Report Status 03/19/2019 FINAL  Final  Culture, blood (routine x 2) Call MD if unable to obtain prior to antibiotics being given     Status: None   Collection Time: 03/18/19  4:00 PM  Result Value Ref Range Status   Specimen Description   Final    BLOOD RIGHT ARM Performed at [place] [address] [place] [address], [place], [place] [address]    Special Requests  Final    BOTTLES DRAWN AEROBIC ONLY Blood Culture results may not be optimal due to an inadequate volume of blood received in culture bottles   Culture   Final    NO  GROWTH 5 DAYS Performed at [facility], [place] [address], [place], [place] [address]    Report Status 03/23/2019 FINAL  Final  Culture, blood (routine x 2) Call MD if unable to obtain prior to antibiotics being given     Status: None   Collection Time: 03/18/19  4:00 PM  Result Value Ref Range Status   Specimen Description   Final    BLOOD RIGHT HAND Performed at [place] [address] [place], [place] [address]    Special Requests   Final    BOTTLES DRAWN AEROBIC ONLY Blood Culture results may not be optimal due to an inadequate volume of blood received in culture bottles   Culture   Final    NO GROWTH 5 DAYS Performed at [facility], [place] [address], [place], [place] [address]    Report Status 03/23/2019 FINAL  Final  MRSA PCR Screening     Status: None   Collection Time: 03/19/19  2:00 PM  Result Value Ref Range Status   MRSA by PCR NEGATIVE NEGATIVE Final    Comment:        The GeneXpert MRSA Assay (FDA approved for NASAL specimens only), is one component of a comprehensive MRSA colonization surveillance program. It is not intended to diagnose MRSA infection nor to guide or monitor treatment for MRSA infections. Performed at [facility], [place] [address], [place], [place] [address]   Culture, respiratory (non-expectorated)     Status: None   Collection Time: 03/21/19  2:42 PM  Result Value Ref Range Status   Specimen Description   Final    TRACHEAL ASPIRATE Performed at [place] [address] [place], [place] [address]    Special Requests   Final    NONE Performed at [facility], [place] [address] [place], [place] [address]    Gram Stain   Final    RARE WBC PRESENT,BOTH PMN AND MONONUCLEAR RARE YEAST Performed at [facility], [place] [address] [place], [place] [address]    Culture FEW CANDIDA ALBICANS  Final   Report Status 03/24/2019 FINAL  Final      Scheduled Meds: . chlorhexidine  15 mL Mouth/Throat BID  . Chlorhexidine Gluconate Cloth  6 each Topical Q0600  . famotidine  20 mg Per Tube Daily  . heparin injection (subcutaneous)  5,000 Units Subcutaneous Q8H  . insulin aspart  0-15 Units Subcutaneous Q4H  . insulin detemir  15 Units Subcutaneous BID  . mouth rinse  15 mL Mouth Rinse 10 times per day  . midodrine  2.5 mg Per Tube Q8H  . pneumococcal 23 valent vaccine  0.5 mL Intramuscular Tomorrow-1000     LOS: 7 days    The patient is critically ill with multi-organ failure.  Critical care was necessary to treat or prevent imminent or life-threatening deterioration of respiratory failure, renal failure and was exclusive of separately billable procedures and treating other patients. Total critical care time spent by me: 35 minutes Time spent personally by me on obtaining history from patient or surrogate, evaluation of the patient, evaluation of patient's response to treatment, ordering and review of laboratory studies, ordering and review of radiographic studies, ordering and performing treatments and interventions,  and re-evaluation of the patient's condition.   Phillips Climes, MD [facility] Office  [phone] Pager - Text Page per Amion  If 7PM-7AM, please contact night-coverage per Amion 03/25/2019, 12:57 PM

## 2019-03-25 NOTE — Progress Notes (Signed)
Initiated CRRT. Immediately had alarms of access negative pressure, Excessive pressure and no detection of return alarms. CRRT stopped. Lines flushed. New set primed and restarted. Received same alarms again. Nephrologist on call paged. Instructed that the line might need TPA or cath might have to be replaced. Most likely cath has to be replaced due to this RN able to flush and get blood return without any resistance through all ports. All blood returned to patient both times.   Nephrologist called this RN back and catheter appears to be too short for left side placement. Advised this RN to flip catheter 180 degrees or to pull catheter back 1-2cm to see if that will help with alarms and functioning of CRRT machine. If this does not work then catheter will have to be replaced by a longer cath.

## 2019-03-25 NOTE — Progress Notes (Signed)
Called and spoke with patient's daughter, Lamarion Mcevers. Provided full update on patient status and answered all questions.

## 2019-03-26 ENCOUNTER — Inpatient Hospital Stay (HOSPITAL_COMMUNITY): Payer: Medicare Other

## 2019-03-26 DIAGNOSIS — R6521 Severe sepsis with septic shock: Secondary | ICD-10-CM

## 2019-03-26 DIAGNOSIS — J80 Acute respiratory distress syndrome: Secondary | ICD-10-CM

## 2019-03-26 LAB — HEPATIC FUNCTION PANEL
ALT: 22 U/L (ref 0–44)
AST: 52 U/L — ABNORMAL HIGH (ref 15–41)
Albumin: 1.8 g/dL — ABNORMAL LOW (ref 3.5–5.0)
Alkaline Phosphatase: 153 U/L — ABNORMAL HIGH (ref 38–126)
Bilirubin, Direct: <0.1 mg/dL (ref 0.0–0.2)
Total Bilirubin: 0.3 mg/dL (ref 0.3–1.2)
Total Protein: 5.3 g/dL — ABNORMAL LOW (ref 6.5–8.1)

## 2019-03-26 LAB — POCT I-STAT 7, (LYTES, BLD GAS, ICA,H+H)
Acid-base deficit: 13 mmol/L — ABNORMAL HIGH (ref 0.0–2.0)
Acid-base deficit: 11 mmol/L — ABNORMAL HIGH (ref 0.0–2.0)
Acid-base deficit: 4 mmol/L — ABNORMAL HIGH (ref 0.0–2.0)
Acid-base deficit: 6 mmol/L — ABNORMAL HIGH (ref 0.0–2.0)
Bicarbonate: 13.4 mmol/L — ABNORMAL LOW (ref 20.0–28.0)
Bicarbonate: 16 mmol/L — ABNORMAL LOW (ref 20.0–28.0)
Bicarbonate: 22.3 mmol/L (ref 20.0–28.0)
Bicarbonate: 23.2 mmol/L (ref 20.0–28.0)
Calcium, Ion: 1.23 mmol/L (ref 1.15–1.40)
Calcium, Ion: 1.2 mmol/L (ref 1.15–1.40)
Calcium, Ion: 1.12 mmol/L — ABNORMAL LOW (ref 1.15–1.40)
Calcium, Ion: 1.15 mmol/L (ref 1.15–1.40)
HCT: 21 % — ABNORMAL LOW (ref 39.0–52.0)
HCT: 26 % — ABNORMAL LOW (ref 39.0–52.0)
HCT: 25 % — ABNORMAL LOW (ref 39.0–52.0)
HCT: 26 % — ABNORMAL LOW (ref 39.0–52.0)
Hemoglobin: 7.1 g/dL — ABNORMAL LOW (ref 13.0–17.0)
Hemoglobin: 8.8 g/dL — ABNORMAL LOW (ref 13.0–17.0)
Hemoglobin: 8.5 g/dL — ABNORMAL LOW (ref 13.0–17.0)
Hemoglobin: 8.8 g/dL — ABNORMAL LOW (ref 13.0–17.0)
O2 Saturation: 96 %
O2 Saturation: 73 %
O2 Saturation: 69 %
O2 Saturation: 95 %
Patient temperature: 98.8
Patient temperature: 98.8
Patient temperature: 34.3
Patient temperature: 36.4
Potassium: 4.8 mmol/L (ref 3.5–5.1)
Potassium: 4.4 mmol/L (ref 3.5–5.1)
Potassium: 4.6 mmol/L (ref 3.5–5.1)
Potassium: 4.8 mmol/L (ref 3.5–5.1)
Sodium: 147 mmol/L — ABNORMAL HIGH (ref 135–145)
Sodium: 147 mmol/L — ABNORMAL HIGH (ref 135–145)
Sodium: 141 mmol/L (ref 135–145)
Sodium: 142 mmol/L (ref 135–145)
TCO2: 14 mmol/L — ABNORMAL LOW (ref 22–32)
TCO2: 17 mmol/L — ABNORMAL LOW (ref 22–32)
TCO2: 24 mmol/L (ref 22–32)
TCO2: 25 mmol/L (ref 22–32)
pCO2 arterial: 33 mmHg (ref 32.0–48.0)
pCO2 arterial: 41.6 mmHg (ref 32.0–48.0)
pCO2 arterial: 47.4 mmHg (ref 32.0–48.0)
pCO2 arterial: 60.7 mmHg — ABNORMAL HIGH (ref 32.0–48.0)
pH, Arterial: 7.218 — ABNORMAL LOW (ref 7.350–7.450)
pH, Arterial: 7.195 — CL (ref 7.350–7.450)
pH, Arterial: 7.174 — CL (ref 7.350–7.450)
pH, Arterial: 7.278 — ABNORMAL LOW (ref 7.350–7.450)
pO2, Arterial: 96 mmHg (ref 83.0–108.0)
pO2, Arterial: 47 mmHg — ABNORMAL LOW (ref 83.0–108.0)
pO2, Arterial: 39 mmHg — CL (ref 83.0–108.0)
pO2, Arterial: 87 mmHg (ref 83.0–108.0)

## 2019-03-26 LAB — CBC WITH DIFFERENTIAL/PLATELET
Abs Immature Granulocytes: 0.16 10*3/uL — ABNORMAL HIGH (ref 0.00–0.07)
Basophils Absolute: 0.1 10*3/uL (ref 0.0–0.1)
Basophils Relative: 0 %
Eosinophils Absolute: 0.3 10*3/uL (ref 0.0–0.5)
Eosinophils Relative: 1 %
HCT: 25.4 % — ABNORMAL LOW (ref 39.0–52.0)
Hemoglobin: 7.8 g/dL — ABNORMAL LOW (ref 13.0–17.0)
Immature Granulocytes: 1 %
Lymphocytes Relative: 4 %
Lymphs Abs: 0.8 10*3/uL (ref 0.7–4.0)
MCH: 28.8 pg (ref 26.0–34.0)
MCHC: 30.7 g/dL (ref 30.0–36.0)
MCV: 93.7 fL (ref 80.0–100.0)
Monocytes Absolute: 0.1 10*3/uL (ref 0.1–1.0)
Monocytes Relative: 1 %
Neutro Abs: 19.6 10*3/uL — ABNORMAL HIGH (ref 1.7–7.7)
Neutrophils Relative %: 93 %
Platelets: 74 10*3/uL — ABNORMAL LOW (ref 150–400)
RBC: 2.71 MIL/uL — ABNORMAL LOW (ref 4.22–5.81)
RDW: 15.9 % — ABNORMAL HIGH (ref 11.5–15.5)
WBC: 21 10*3/uL — ABNORMAL HIGH (ref 4.0–10.5)
nRBC: 0.3 % — ABNORMAL HIGH (ref 0.0–0.2)

## 2019-03-26 LAB — GLUCOSE, CAPILLARY
Glucose-Capillary: 104 mg/dL — ABNORMAL HIGH (ref 70–99)
Glucose-Capillary: 130 mg/dL — ABNORMAL HIGH (ref 70–99)
Glucose-Capillary: 144 mg/dL — ABNORMAL HIGH (ref 70–99)
Glucose-Capillary: 146 mg/dL — ABNORMAL HIGH (ref 70–99)
Glucose-Capillary: 156 mg/dL — ABNORMAL HIGH (ref 70–99)
Glucose-Capillary: 171 mg/dL — ABNORMAL HIGH (ref 70–99)

## 2019-03-26 LAB — TRIGLYCERIDES: Triglycerides: 277 mg/dL — ABNORMAL HIGH (ref <150)

## 2019-03-26 LAB — MAGNESIUM: Magnesium: 2.6 mg/dL — ABNORMAL HIGH (ref 1.7–2.4)

## 2019-03-26 LAB — RENAL FUNCTION PANEL
Albumin: 1.8 g/dL — ABNORMAL LOW (ref 3.5–5.0)
Albumin: 1.9 g/dL — ABNORMAL LOW (ref 3.5–5.0)
Anion gap: 7 (ref 5–15)
Anion gap: 6 (ref 5–15)
BUN: 112 mg/dL — ABNORMAL HIGH (ref 8–23)
BUN: 64 mg/dL — ABNORMAL HIGH (ref 8–23)
CO2: 15 mmol/L — ABNORMAL LOW (ref 22–32)
CO2: 23 mmol/L (ref 22–32)
Calcium: 7.5 mg/dL — ABNORMAL LOW (ref 8.9–10.3)
Calcium: 7.1 mg/dL — ABNORMAL LOW (ref 8.9–10.3)
Chloride: 124 mmol/L — ABNORMAL HIGH (ref 98–111)
Chloride: 113 mmol/L — ABNORMAL HIGH (ref 98–111)
Creatinine, Ser: 4.8 mg/dL — ABNORMAL HIGH (ref 0.61–1.24)
Creatinine, Ser: 2.84 mg/dL — ABNORMAL HIGH (ref 0.61–1.24)
GFR calc Af Amer: 13 mL/min — ABNORMAL LOW (ref >60)
GFR calc Af Amer: 25 mL/min — ABNORMAL LOW (ref >60)
GFR calc non Af Amer: 12 mL/min — ABNORMAL LOW (ref >60)
GFR calc non Af Amer: 22 mL/min — ABNORMAL LOW (ref >60)
Glucose, Bld: 137 mg/dL — ABNORMAL HIGH (ref 70–99)
Glucose, Bld: 177 mg/dL — ABNORMAL HIGH (ref 70–99)
Phosphorus: 3.5 mg/dL (ref 2.5–4.6)
Phosphorus: 3 mg/dL (ref 2.5–4.6)
Potassium: 4.6 mmol/L (ref 3.5–5.1)
Potassium: 4.4 mmol/L (ref 3.5–5.1)
Sodium: 146 mmol/L — ABNORMAL HIGH (ref 135–145)
Sodium: 142 mmol/L (ref 135–145)

## 2019-03-26 LAB — C-REACTIVE PROTEIN: CRP: 9.8 mg/dL — ABNORMAL HIGH (ref <1.0)

## 2019-03-26 LAB — APTT: aPTT: 104 seconds — ABNORMAL HIGH (ref 24–36)

## 2019-03-26 LAB — FERRITIN: Ferritin: 247 ng/mL (ref 24–336)

## 2019-03-26 LAB — D-DIMER, QUANTITATIVE: D-Dimer, Quant: 3.96 ug/mL-FEU — ABNORMAL HIGH (ref 0.00–0.50)

## 2019-03-26 MED ORDER — CHLORHEXIDINE GLUCONATE 0.12 % MT SOLN
15.0000 mL | Freq: Two times a day (BID) | OROMUCOSAL | Status: DC
  Administered 2019-03-26 – 2019-03-31 (×12): 15 mL via OROMUCOSAL
  Filled 2019-03-26 (×6): qty 15

## 2019-03-26 MED ORDER — B COMPLEX-C PO TABS
1.0000 | ORAL_TABLET | Freq: Every day | ORAL | Status: DC
  Administered 2019-03-26 – 2019-03-31 (×6): 1
  Filled 2019-03-26 (×8): qty 1

## 2019-03-26 MED ORDER — PROPOFOL 1000 MG/100ML IV EMUL
5.0000 ug/kg/min | INTRAVENOUS | Status: DC
  Administered 2019-03-26: 5 ug/kg/min via INTRAVENOUS
  Filled 2019-03-26: qty 100

## 2019-03-26 MED ORDER — SODIUM BICARBONATE 8.4 % IV SOLN
50.0000 meq | Freq: Once | INTRAVENOUS | Status: AC
  Administered 2019-03-26: 50 meq via INTRAVENOUS
  Filled 2019-03-26: qty 50

## 2019-03-26 MED ORDER — MIDODRINE HCL 5 MG PO TABS
5.0000 mg | ORAL_TABLET | Freq: Three times a day (TID) | ORAL | Status: DC
  Administered 2019-03-26 – 2019-03-27 (×3): 5 mg
  Filled 2019-03-26 (×4): qty 1

## 2019-03-26 NOTE — Progress Notes (Signed)
CRRT stopped at 0445 due to CRRT machine malfunction. Blood returned. New machine retrieved. CRRT resumed at 0555  Pt experienced bradycardia (40-50) and hypotension (60's) with CRRT initiation this time. Blood flow decreased to 169ml/hr. Levo increased to 35mcg. Multiple PAC's  Will continue to monitor.

## 2019-03-26 NOTE — Progress Notes (Signed)
Mode changed to [facility] by CCM at bedside due to vent dyssynchrony.  PC 14 Rate 28 Fio2 80% Peep 12

## 2019-03-26 NOTE — Progress Notes (Signed)
PROGRESS NOTE  Brian Owen XKG:[id number] DOB: 1951/02/16 DOA: 03/12/2019 PCP: Alfonse Spruce, FNP   LOS: 8 days   Brief Narrative / Interim history: 68 year old male with history of hypertension, diabetes, chronic kidney disease stage III, multiple prior admissions for orthostatic hypotension related to autonomic dysfunction-noncompliant with Midodrine, who was admitted to the hospital on 03/16/2019 with fever, worsening renal failure, as well as multifocal pneumonia on chest x-ray.  He was found to be Covid positive  Significant Events: 5/19 admit  5/22 saturations declining 5/23 transferred to ICU - intubated - R IJ CVL  COVID-19 specific Treatment: Actemra 5/21 & 5/23 Steroids 5/21 > 5/23  Subjective: Intubated and on vent  Assessment & Plan: Principal Problem:   Acute respiratory disease due to COVID-19 virus Active Problems:   Type 2 diabetes mellitus with hyperglycemia, with long-term current use of insulin (HCC)   AKI (acute kidney injury) ([place])   Severe sepsis (HCC)   Acute respiratory failure with hypoxia (HCC)   Principal Problem Acute Hypoxic Respiratory Failure due to Covid-19 Viral Illness -Patient requires ongoing ventilator support, critical care following and managing vent  Vent Mode: PRVC FiO2 (%):  [50 %-100 %] 60 % Set Rate:  [28 bmp] 28 bmp Vt Set:  [310 mL] 310 mL PEEP:  [12 cmH20-14 cmH20] 14 cmH20  -Not a candidate for Remdesivir due to renal failure -Has received Actemra x2 -Has received Solu-Medrol x3 days up until 5/23  COVID-19 Labs  Recent Labs    03/24/19 0225 03/25/19 0230 03/26/19 0300  DDIMER 2.74* 2.91* 3.96*  FERRITIN 319 226 247  CRP 4.2* 5.0* 9.8*    Lab Results  Component Value Date   SARSCOV2NAA POSITIVE (A) 03/22/2019     Active Problems:  Autonomic dysfunction /chronic orthostatic hypotension -Cont midodrine per tube, patient was started on vasopressors continue today  Acute kidney injury on chronic  kidney disease stage III -Baseline creatinine around 2.0, urine output was minimal, did not respond to Lasix and patient was started on CRRT 5/28, nephrology consulted. Metabolic encephalopathy  -No acute finding  Type 2 diabetes mellitus  -currently on 15 units of Levemir along with sliding scale, CBGs well-controlled between 101 50, keep on same regimen  Mildly elevated troponin -Denied chest pain when alert - likely related to renal dysfunction and hypotension - Troponin normal in follow-up  Positive blood culture - Coag neg Staph + Diptheroids (1 of 2 blood cx) -Clinically most consistent with contamination - antibiotics not indicated for this issue at present   Scheduled Meds: . B-complex with vitamin C  1 tablet Per Tube Daily  . chlorhexidine  15 mL Mouth/Throat BID  . Chlorhexidine Gluconate Cloth  6 each Topical Q0600  . famotidine  20 mg Per Tube Daily  . heparin injection (subcutaneous)  5,000 Units Subcutaneous Q8H  . insulin aspart  0-15 Units Subcutaneous Q4H  . insulin detemir  15 Units Subcutaneous BID  . mouth rinse  15 mL Mouth Rinse 10 times per day  . midodrine  5 mg Per Tube Q8H  . pneumococcal 23 valent vaccine  0.5 mL Intramuscular Tomorrow-1000   Continuous Infusions: .  prismasol BGK 4/2.5 400 mL/hr at 03/26/19 0900  .  prismasol BGK 4/2.5 200 mL/hr at 03/26/19 1345  . sodium chloride 10 mL/hr at 03/26/19 0700  . dexmedetomidine (PRECEDEX) IV infusion Stopped (03/25/19 0511)  . feeding supplement (VITAL AF 1.2 CAL) 1,000 mL (03/26/19 1330)  . heparin 10,000 units/ 20 mL infusion syringe 1,000 Units/hr (  03/26/19 1345)  . HYDROmorphone 2 mg/hr (03/26/19 1305)  . norepinephrine (LEVOPHED) Adult infusion 8 mcg/min (03/26/19 1200)  . prismasol BGK 4/2.5 1,750 mL/hr at 03/26/19 1300   PRN Meds:.sodium chloride, acetaminophen (TYLENOL) oral liquid 160 mg/5 mL, acetaminophen, heparin, heparin, HYDROmorphone, lip balm, midazolam  DVT prophylaxis: heparin Code  Status: Full code Family Communication: none  Disposition Plan: TBD  Consultants:   PCCM   Procedures:   None   Antimicrobials: Azithro 5/19 x 1 Ceftriaxone 5/19 x 1 Cefepime 5/20 >>off vanco 5/20 >> 5/22   Objective: Vitals:   03/26/19 1000 03/26/19 1100 03/26/19 1200 03/26/19 1300  BP: (!) 114/59 (!) 92/53 (!) 85/46 (!) 104/49  Pulse: 72 69 62 63  Resp: 11 19 20  (!) 28  Temp:      TempSrc:      SpO2: 100% 100% 97% 96%  Weight:      Height:        Intake/Output Summary (Last 24 hours) at 03/26/2019 1412 Last data filed at 03/26/2019 1400 Gross per 24 hour  Intake 2010.19 ml  Output 1649 ml  Net 361.19 ml   Filed Weights   03/23/19 0500 03/25/19 0533 03/26/19 0245  Weight: 61.1 kg 52.8 kg 50.7 kg    Examination:  Constitutional: Intubated and sedated Eyes: No scleral icterus ENMT: Mucous membranes are moist.  Neck: normal, supple Respiratory: Breathing on the vent, no significant wheezing or crackles heard  Cardiovascular: Regular rate and rhythm, no murmurs / rubs / gallops.  Abdomen: no tenderness. Bowel sounds positive.  Musculoskeletal: no clubbing / cyanosis.  Skin: no rashes Neurologic: Sedated on vent   Data Reviewed: I have independently reviewed following labs and imaging studies   CBC: Recent Labs  Lab 03/22/19 0341  03/23/19 0322 03/24/19 0225 03/25/19 0230 03/26/19 0235 03/26/19 0300 03/26/19 0642  WBC 7.0  --  8.3 13.4* 15.5*  --  21.0*  --   NEUTROABS 6.4  --  7.4 12.5* 14.7*  --  19.6*  --   HGB 9.7*   < > 9.2* 8.8* 8.8* 7.1* 7.8* 8.8*  HCT 30.5*   < > 30.1* 28.4* 29.5* 21.0* 25.4* 26.0*  MCV 91.6  --  96.2 94.4 95.8  --  93.7  --   PLT 122*  --  89* 71* 76*  --  74*  --    < > = values in this interval not displayed.   Basic Metabolic Panel: Recent Labs  Lab 03/22/19 1830 03/23/19 0322 03/23/19 1431 03/24/19 0225 03/24/19 1442 03/25/19 0230 03/26/19 0235 03/26/19 0300 03/26/19 0642  NA  --  144 143 144 145 146*  147* 146* 147*  K  --  4.6 4.9 4.4 4.4 4.9 4.8 4.6 4.4  CL  --  116* 118* 121* 123* 124*  --  124*  --   CO2  --  23 20* 19* 18* 18*  --  15*  --   GLUCOSE  --  278* 273* 195* 229* 286*  --  137*  --   BUN  --  93* 95* 98* 100* 83*  --  112*  --   CREATININE  --  4.04* 4.09* 4.31* 4.07* 3.94*  --  4.80*  --   CALCIUM  --  8.2* 7.9* 8.0* 7.7* 7.7*  --  7.5*  --   MG 2.5* 2.5*  --  2.6*  --  2.5*  --  2.6*  --   PHOS 5.8* 6.1*  --  4.5  --   --   --  3.5  --    GFR: Estimated Creatinine Clearance: 10.6 mL/min (A) (by C-G formula based on SCr of 4.8 mg/dL (H)). Liver Function Tests: Recent Labs  Lab 03/22/19 0341 03/23/19 0322 03/24/19 0225 03/25/19 0230 03/26/19 0300  AST 37 35 30 33 52*  ALT 21 20 20 21 22   ALKPHOS 73 79 89 108 153*  BILITOT 0.5 0.3 0.2* 0.4 0.3  PROT 5.8* 5.9* 5.4* 5.4* 5.3*  ALBUMIN 2.1* 2.0* 1.9* 1.9* 1.8*  1.8*   No results for input(s): LIPASE, AMYLASE in the last 168 hours. Recent Labs  Lab 03/24/19 0445  AMMONIA 39*   Coagulation Profile: No results for input(s): INR, PROTIME in the last 168 hours. Cardiac Enzymes: Recent Labs  Lab 03/22/19 0342  TROPONINI <0.03   BNP (last 3 results) No results for input(s): PROBNP in the last 8760 hours. HbA1C: No results for input(s): HGBA1C in the last 72 hours. CBG: Recent Labs  Lab 03/25/19 2015 03/25/19 2354 03/26/19 0348 03/26/19 0822 03/26/19 1203  GLUCAP 171* 144* 104* 130* 156*   Lipid Profile: No results for input(s): CHOL, HDL, LDLCALC, TRIG, CHOLHDL, LDLDIRECT in the last 72 hours. Thyroid Function Tests: No results for input(s): TSH, T4TOTAL, FREET4, T3FREE, THYROIDAB in the last 72 hours. Anemia Panel: Recent Labs    03/24/19 0225 03/25/19 0230 03/26/19 0300  VITAMINB12 896  --   --   FOLATE 8.2  --   --   FERRITIN 319 226 247   Urine analysis:    Component Value Date/Time   COLORURINE YELLOW 03/18/2019 0048   APPEARANCEUR CLEAR 03/18/2019 0048   LABSPEC 1.013  03/18/2019 0048   PHURINE 5.0 03/18/2019 0048   GLUCOSEU >=500 (A) 03/18/2019 0048   HGBUR LARGE (A) 03/18/2019 0048   BILIRUBINUR NEGATIVE 03/18/2019 0048   BILIRUBINUR neg 09/05/2017 1111   KETONESUR 5 (A) 03/18/2019 0048   PROTEINUR 100 (A) 03/18/2019 0048   UROBILINOGEN 0.2 09/05/2017 1111   UROBILINOGEN 0.2 07/22/2015 1941   NITRITE NEGATIVE 03/18/2019 0048   LEUKOCYTESUR NEGATIVE 03/18/2019 0048   Sepsis Labs: Invalid input(s): PROCALCITONIN, LACTICIDVEN  Recent Results (from the past 240 hour(s))  Blood Culture (routine x 2)     Status: None   Collection Time: 03/02/2019  8:24 PM  Result Value Ref Range Status   Specimen Description BLOOD LEFT FOREARM  Final   Special Requests   Final    BOTTLES DRAWN AEROBIC ONLY Blood Culture adequate volume   Culture   Final    NO GROWTH 5 DAYS Performed at [facility], [address], [place], [place] [address]    Report Status 03/22/2019 FINAL  Final  Blood Culture (routine x 2)     Status: Abnormal   Collection Time: 03/09/2019 10:03 PM  Result Value Ref Range Status   Specimen Description BLOOD RIGHT IV  Final   Special Requests   Final    BOTTLES DRAWN AEROBIC AND ANAEROBIC Blood Culture results may not be optimal due to an excessive volume of blood received in culture bottles   Culture  Setup Time   Final    GRAM POSITIVE COCCI ANAEROBIC BOTTLE ONLY Organism ID to follow CRITICAL RESULT CALLED TO, READ BACK BY AND VERIFIED WITH: Jene Every PharmD 16:05 03/18/19 ([email or web address]) AEROBIC BOTTLE ONLY GRAM POSITIVE RODS CRITICAL RESULT CALLED TO, READ BACK BY AND VERIFIED WITH: E WILLIAMSON PHARMD 03/18/19 2128 JDW    Culture (A)  Final    STAPHYLOCOCCUS SPECIES (COAGULASE NEGATIVE) DIPHTHEROIDS(CORYNEBACTERIUM SPECIES) THE  SIGNIFICANCE OF ISOLATING THIS ORGANISM FROM A SINGLE SET OF BLOOD CULTURES WHEN MULTIPLE SETS ARE DRAWN IS UNCERTAIN. PLEASE NOTIFY THE MICROBIOLOGY DEPARTMENT WITHIN ONE WEEK IF SPECIATION AND SENSITIVITIES ARE  REQUIRED. Performed at [facility], [place] [address] [place], [place] [address]    Report Status 03/19/2019 FINAL  Final  Blood Culture ID Panel (Reflexed)     Status: Abnormal   Collection Time: 03/22/2019 10:03 PM  Result Value Ref Range Status   Enterococcus species NOT DETECTED NOT DETECTED Final   Listeria monocytogenes NOT DETECTED NOT DETECTED Final   Staphylococcus species DETECTED (A) NOT DETECTED Final    Comment: Methicillin (oxacillin) resistant coagulase negative staphylococcus. Possible blood culture contaminant (unless isolated from more than one blood culture draw or clinical case suggests pathogenicity). No antibiotic treatment is indicated for blood  culture contaminants. CRITICAL RESULT CALLED TO, READ BACK BY AND VERIFIED WITH: Jene Every PharmD 16:05 03/18/19 ([email or web address])    Staphylococcus aureus (BCID) NOT DETECTED NOT DETECTED Final   Methicillin resistance DETECTED (A) NOT DETECTED Final    Comment: CRITICAL RESULT CALLED TO, READ BACK BY AND VERIFIED WITH: Jene Every PharmD 16:05 03/18/19 ([email or web address])    Streptococcus species NOT DETECTED NOT DETECTED Final   Streptococcus agalactiae NOT DETECTED NOT DETECTED Final   Streptococcus pneumoniae NOT DETECTED NOT DETECTED Final   Streptococcus pyogenes NOT DETECTED NOT DETECTED Final   Acinetobacter baumannii NOT DETECTED NOT DETECTED Final   Enterobacteriaceae species NOT DETECTED NOT DETECTED Final   Enterobacter cloacae complex NOT DETECTED NOT DETECTED Final   Escherichia coli NOT DETECTED NOT DETECTED Final   Klebsiella oxytoca NOT DETECTED NOT DETECTED Final   Klebsiella pneumoniae NOT DETECTED NOT DETECTED Final   Proteus species NOT DETECTED NOT DETECTED Final   Serratia marcescens NOT DETECTED NOT DETECTED Final   Haemophilus influenzae NOT DETECTED NOT DETECTED Final   Neisseria meningitidis NOT DETECTED NOT DETECTED Final   Pseudomonas aeruginosa NOT DETECTED NOT DETECTED Final   Candida albicans NOT DETECTED  NOT DETECTED Final   Candida glabrata NOT DETECTED NOT DETECTED Final   Candida krusei NOT DETECTED NOT DETECTED Final   Candida parapsilosis NOT DETECTED NOT DETECTED Final   Candida tropicalis NOT DETECTED NOT DETECTED Final    Comment: Performed at [facility], [place] [address], [place], [place] [address]  SARS Coronavirus 2 (CEPHEID- Performed in [place] [facility] lab), Hosp Order     Status: Abnormal   Collection Time: 03/12/2019 11:52 PM  Result Value Ref Range Status   SARS Coronavirus 2 POSITIVE (A) NEGATIVE Final    Comment: RESULT CALLED TO, READ BACK BY AND VERIFIED WITH: P JOHNSTON RN 03/17/18 0131 JDW (NOTE) If result is NEGATIVE SARS-CoV-2 target nucleic acids are NOT DETECTED. The SARS-CoV-2 RNA is generally detectable in upper and lower  respiratory specimens during the acute phase of infection. The lowest  concentration of SARS-CoV-2 viral copies this assay can detect is 250  copies / mL. A negative result does not preclude SARS-CoV-2 infection  and should not be used as the sole basis for treatment or other  patient management decisions.  A negative result may occur with  improper specimen collection / handling, submission of specimen other  than nasopharyngeal swab, presence of viral mutation(s) within the  areas targeted by this assay, and inadequate number of viral copies  (<250 copies / mL). A negative result must be combined with clinical  observations, patient history, and epidemiological information. If result is POSITIVE  SARS-CoV-2 target nucleic acids are DETECTED. The SA RS-CoV-2 RNA is generally detectable in upper and lower  respiratory specimens during the acute phase of infection.  Positive  results are indicative of active infection with SARS-CoV-2.  Clinical  correlation with patient history and other diagnostic information is  necessary to determine patient infection status.  Positive results do  not rule out bacterial infection or  co-infection with other viruses. If result is PRESUMPTIVE POSTIVE SARS-CoV-2 nucleic acids MAY BE PRESENT.   A presumptive positive result was obtained on the submitted specimen  and confirmed on repeat testing.  While 2019 novel coronavirus  (SARS-CoV-2) nucleic acids may be present in the submitted sample  additional confirmatory testing may be necessary for epidemiological  and / or clinical management purposes  to differentiate between  SARS-CoV-2 and other Sarbecovirus currently known to infect humans.  If clinically indicated additional testing with an alternate test  methodology [phone]) is advi sed. The SARS-CoV-2 RNA is generally  detectable in upper and lower respiratory specimens during the acute  phase of infection. The expected result is Negative. Fact Sheet for Patients:  StrictlyIdeas.no Fact Sheet for Healthcare Providers: BankingDealers.co.za This test is not yet approved or cleared by the [place] FDA and has been authorized for detection and/or diagnosis of SARS-CoV-2 by FDA under an Emergency Use Authorization (EUA).  This EUA will remain in effect (meaning this test can be used) for the duration of the COVID-19 declaration under Section 564(b)(1) of the Act, 21 U.S.C. section 360bbb-3(b)(1), unless the authorization is terminated or revoked sooner. Performed at [facility], [place] [address], [place], [place] [address]   Urine culture     Status: Abnormal   Collection Time: 03/18/19 12:46 AM  Result Value Ref Range Status   Specimen Description URINE, CLEAN CATCH  Final   Special Requests NONE  Final   Culture (A)  Final    <10,000 COLONIES/mL INSIGNIFICANT GROWTH Performed at [facility], [address] [place], [place] [address]    Report Status 03/19/2019 FINAL  Final  Culture, blood (routine x 2) Call MD if unable to obtain prior to antibiotics being given     Status: None   Collection  Time: 03/18/19  4:00 PM  Result Value Ref Range Status   Specimen Description   Final    BLOOD RIGHT ARM Performed at [facility] [address], [place], [place] [address]    Special Requests   Final    BOTTLES DRAWN AEROBIC ONLY Blood Culture results may not be optimal due to an inadequate volume of blood received in culture bottles   Culture   Final    NO GROWTH 5 DAYS Performed at [facility], [place] [address] [place], [place] [address]    Report Status 03/23/2019 FINAL  Final  Culture, blood (routine x 2) Call MD if unable to obtain prior to antibiotics being given     Status: None   Collection Time: 03/18/19  4:00 PM  Result Value Ref Range Status   Specimen Description   Final    BLOOD RIGHT HAND Performed at [facility] [address] [place], [place] [address]    Special Requests   Final    BOTTLES DRAWN AEROBIC ONLY Blood Culture results may not be optimal due to an inadequate volume of blood received in culture bottles   Culture   Final    NO GROWTH 5 DAYS Performed at [facility], [place] [address], [place], [place] [address]    Report Status 03/23/2019 FINAL  Final  MRSA PCR Screening     Status: None   Collection Time: 03/19/19  2:00 PM  Result Value Ref Range Status   MRSA by PCR NEGATIVE NEGATIVE Final    Comment:        The GeneXpert MRSA Assay (FDA approved for NASAL specimens only), is one component of a comprehensive MRSA colonization surveillance program. It is not intended to diagnose MRSA infection nor to guide or monitor treatment for MRSA infections. Performed at [facility], [place] [address] [place], [place] [address]   Culture, respiratory (non-expectorated)     Status: None   Collection Time: 03/21/19  2:42 PM  Result Value Ref Range Status   Specimen Description   Final    TRACHEAL ASPIRATE Performed at [facility] [place] [address], [place],  [place] [address]    Special Requests   Final    NONE Performed at [facility], [place] [address], [place], [place] [address]    Gram Stain   Final    RARE WBC PRESENT,BOTH PMN AND MONONUCLEAR RARE YEAST Performed at [facility], [place] [address] [place], [place] [address]    Culture FEW CANDIDA ALBICANS  Final   Report Status 03/24/2019 FINAL  Final      Radiology Studies: Dg Chest Port 1 View  Result Date: 03/26/2019 CLINICAL DATA:  Follow-up pneumonia EXAM: PORTABLE CHEST 1 VIEW COMPARISON:  03/25/2019, 03/23/2019, 03/22/2019 FINDINGS: Endotracheal tube tip is about 3.9 cm superior to the carina. Esophageal tube tip overlies the gastric body. Bilateral central venous catheter tips over the SVC. Overall no significant interval change in extensive ground-glass opacities and consolidations within the bilateral lungs compared to radiographs earlier the same day. Stable cardiomediastinal silhouette. Aortic atherosclerosis. No pneumothorax. IMPRESSION: 1. Support lines and tubes as above 2. Overall no significant interval change in extensive bilateral ground-glass opacity and consolidations as compared with radiographs performed earlier today. Electronically Signed   By: Donavan Foil M.D.   On: 03/26/2019 02:31   Dg Chest Port 1 View  Result Date: 03/25/2019 CLINICAL DATA:  Pneumonia EXAM: PORTABLE CHEST 1 VIEW COMPARISON:  03/25/2019 FINDINGS: The endotracheal tube terminates just above the carina. The right-sided central venous catheter is stable. The enteric tube terminates over the gastric body. Diffuse bilateral hazy airspace opacities are again noted. These have not significantly improved from prior study given differences in technique. There is no pneumothorax. IMPRESSION: 1. Lines and tubes as above. 2. Diffuse hazy bilateral airspace opacities, not significantly improved from prior study given differences in technique and patient positioning. Electronically Signed   By:  Constance Holster M.D.   On: 03/25/2019 17:50   Dg Chest Port 1 View  Result Date: 03/25/2019 CLINICAL DATA:  Hypoxia. EXAM: PORTABLE CHEST 1 VIEW COMPARISON:  One-view chest x-ray 03/23/2019 FINDINGS: Endotracheal tube scratched at the patient remains intubated. Endotracheal tube ends 4 cm above the carina. Right IJ line is stable. NG tube courses off the inferior border of film. Diffuse interstitial and airspace disease has increased since the prior exam. IMPRESSION: 1. Support apparatus stable. 2. Increased interstitial and airspace disease compatible with infection and ARDS. Electronically Signed   By: San Morelle M.D.   On: 03/25/2019 12:56    Marzetta Board, MD, PhD [facility]  Contact via  [email or web address]  [place] P: [phone]  F: [phone]

## 2019-03-26 NOTE — Progress Notes (Signed)
[facility] Progress Note  Subjective: cRRT didn't run well overnight, 15 cm cath replaced w/ 20 cm cath and now is running better.  Ran from 6am to ~ 11 am , filter clotting on 500 u/ hr circuit heparin.    Vitals:   03/26/19 0835 03/26/19 0900 03/26/19 0911 03/26/19 1000  BP: 96/67 (!) 126/58  (!) 114/59  Pulse: 73 78  72  Resp: '15 18  11  ' Temp:      TempSrc:      SpO2: 99% 96% 97% 100%  Weight:      Height:        Inpatient medications: . B-complex with vitamin C  1 tablet Per Tube Daily  . chlorhexidine  15 mL Mouth/Throat BID  . Chlorhexidine Gluconate Cloth  6 each Topical Q0600  . famotidine  20 mg Per Tube Daily  . heparin injection (subcutaneous)  5,000 Units Subcutaneous Q8H  . insulin aspart  0-15 Units Subcutaneous Q4H  . insulin detemir  15 Units Subcutaneous BID  . mouth rinse  15 mL Mouth Rinse 10 times per day  . midodrine  5 mg Per Tube Q8H  . pneumococcal 23 valent vaccine  0.5 mL Intramuscular Tomorrow-1000   .  prismasol BGK 4/2.5 400 mL/hr at 03/26/19 0900  .  prismasol BGK 4/2.5 200 mL/hr at 03/26/19 0304  . sodium chloride 10 mL/hr at 03/26/19 0700  . dexmedetomidine (PRECEDEX) IV infusion Stopped (03/25/19 0511)  . feeding supplement (VITAL AF 1.2 CAL) 50 mL/hr at 03/26/19 0800  . heparin 10,000 units/ 20 mL infusion syringe 500 Units/hr (03/26/19 0700)  . HYDROmorphone 2 mg/hr (03/26/19 1000)  . norepinephrine (LEVOPHED) Adult infusion 3 mcg/min (03/26/19 0700)  . prismasol BGK 4/2.5 1,750 mL/hr at 03/26/19 0800   sodium chloride, acetaminophen (TYLENOL) oral liquid 160 mg/5 mL, acetaminophen, heparin, heparin, HYDROmorphone, lip balm, midazolam    date                Creat               eGFR  2009- 2016     0.6- 1.0  2017- 2019     1.2- 1.06 Nov 2018        2.0- 2.7            27- 34  Feb 2020        2.0- 2.3            33- 38  03/24/2019 3.36  admission   Exam: Patient not examined directly given COVID-19 + status,  utilizing exam of the primary team and observations of RN's.    Home meds:  - insulin glargine 9u qd     CXR 5/19 > Worsening hazy multifocal airspace opacities concerning for progression of viral pneumonia  CXR 5/24 > Patchy multilobar pneumonia with slight improved aeration in the left lung and slightly worsened aeration in the right lung...  CXR 5/27 > Increased interstitial and airspace disease compatible with infection and ARDS   UA 5/15 > 30 prot, 0-5 rbc/ wbc  UA 5/20 > 100 prot, 0-5 rbc/ wbc  Renal US 5/26 - Right nephrolithiasis.  Benign-appearing cyst in the left kidney measures 2.2 cm.  No evidence of hydronephrosis. Foley catheter decompresses the bladder.  I/O cumulative +14L and -4.2L, net +9.8 L   CVP = 6-8   Assessment: 1 AKI on CKD 3/4 -baseline creat  2.0- 2.4. Making some urine 500 cc/ day. CXR's worse in appearance of edema.  CRRT started yest 5/27, poor cath function now replaced w/ longer cath and working better.   - cont CRRT, pull fluid as tolerated, need to see if worsening CXR is vol overload vs ARDS - I/O net +8L since admit, but wt's are down ? Accuracy - filter clotting on 500u/ hr heparin into circuit > ^1000u/hr standing dose - FiO2 down 60% - on 1 pressor levo gtt at  2 mg /hr     2  COVID + viral PNA - per CCM  3  IDDM  4  Tobacco smoker  5  Met acidosis - from A/CRF, resp failure, should improve now that CRRT working better.  Get labs this afternoon.     [facility] 03/26/2019, 10:43 AM  Iron/TIBC/Ferritin/ %Sat    Component Value Date/Time   IRON 59 05/15/2014 0538   TIBC 163 (L) 05/15/2014 0538   FERRITIN 247 03/26/2019 0300   IRONPCTSAT 36 05/15/2014 0538   Recent Labs  Lab 03/26/19 0300 03/26/19 0642  NA 146* 147*  K 4.6 4.4  CL 124*  --   CO2 15*  --   GLUCOSE 137*  --   BUN 112*  --   CREATININE 4.80*  --   CALCIUM 7.5*  --   PHOS 3.5  --   ALBUMIN 1.8*  1.8*  --    Recent Labs  Lab  03/26/19 0300  AST 52*  ALT 22  ALKPHOS 153*  BILITOT 0.3  PROT 5.3*   Recent Labs  Lab 03/26/19 0300 03/26/19 0642  WBC 21.0*  --   HGB 7.8* 8.8*  HCT 25.4* 26.0*  PLT 74*  --

## 2019-03-26 NOTE — Progress Notes (Signed)
Spoke with pt daughter. Provided update and plan for the day. Daughter expresses her appreciation for all care providers and prays for our safety. Emotional support is ongoing for pt and family.

## 2019-03-26 NOTE — Progress Notes (Signed)
NAMELynette Owen, MRN:  [id number], DOB:  03-27-1951, LOS: 8 ADMISSION DATE:  03/22/2019, CONSULTATION DATE: Mar 21, 2019 REFERRING MD: Dr. Thereasa Solo, CHIEF COMPLAINT: Dyspnea  Brief History   67 year old male admitted on May 19 with COVID-19 pneumonia.  Developed acute kidney injury and worsening hypoxemic respiratory failure requiring intubation and mechanical ventilation on Mar 21, 2019.  Past Medical History  CKD Cigarette smoker Hypertension Diabetes mellitus type 2  Significant Hospital Events     Consults:  PCCM  Procedures:  ET tube 5/23 >>  Right IJ CVC 5/23 >>   Significant Diagnostic Tests:  May 25 CT head no acute intracranial process  Micro Data:  SARS CoV2 5/19 >> positive Urine 5/20 >> insignificant growth Blood 5/19 >> 1 of 2 MRSE (? Contaminant) Blood 5/20 >>  Resp 5/23 >>   Antimicrobials:  Azithro 5/19 x 1 Ceftriaxone 5/19 x 1 Cefepime 5/20 >> off vanco 5/20 >> 5/22  Interim history/subjective:   HD catheter had to be changed overnight  Objective   Blood pressure (!) 106/54, pulse 70, temperature 98.8 F (37.1 C), temperature source Oral, resp. rate 15, height 5\' 1"  (1.549 m), weight 50.7 kg, SpO2 100 %.    Vent Mode: PRVC FiO2 (%):  [50 %-100 %] 60 % Set Rate:  [28 bmp] 28 bmp Vt Set:  [310 mL] 310 mL PEEP:  [12 cmH20-14 cmH20] 14 cmH20 Plateau Pressure:  [12 cmH20-26 cmH20] 25 cmH20   Intake/Output Summary (Last 24 hours) at 03/26/2019 3536 Last data filed at 03/26/2019 0700 Gross per 24 hour  Intake 2125.01 ml  Output 699 ml  Net 1426.01 ml   Filed Weights   03/23/19 0500 03/25/19 0533 03/26/19 0245  Weight: 61.1 kg 52.8 kg 50.7 kg   Examination:  General: Acutely ill appearing male HENT: [place]/AT, PERRL, EOM-I and MMM PULM: CTA bilaterally CV: RRR, Nl S1/S2 and -M/R/G GI: Soft, NT, ND and +BS MSK: normal bulk and tone Neuro: sedated on vent  I reviewed CXR myself, ETT is in a good position and infiltrate noted  Resolved  Hospital Problem list     Assessment & Plan:  Acute respiratory failure with hypoxemia due to COVID-19: Chest x-ray findings and oxygenation worsening on May 27 which I think is due to acute pulmonary edema Decrease PEEP to 10 Titrate O2 for sat of 88-92% Full vent support Hold wake-up assessment given worsening ventilator needs Fluid negative via CRRT as able  Acute kidney injury: give oliguria and worsening CXR findings, I suspect he has acute pulm edema CRRT -50, will attempt more if BP allows Monitor urine output Monitor labs  Autonomic instability/hypotension: Shock resolved/chronic hypotension again worse 5/27 but no clear sign of sepsis Resume norepinehrpine titrated to mean arterial pressure greater than 65 Continue Midodrine, increase to 5 mg q8  Need for mechanical ventilation, sedation for ventilator synchrony: Intermittent periods of unresponsiveness: Felt to be related to narcotics, head CT unremarkable Continue dilaudid infusion Versed PRN  Best practice:  Diet: Tube feeding Pain/Anxiety/Delirium protocol (if indicated): yes, RASS-1 to -2 VAP protocol (if indicated): yes DVT prophylaxis: sub q heparin GI prophylaxis: famotidine Glucose control: SSI Mobility: bedrest Code Status: full Family Communication: per [facility] Disposition: remain in ICU  Labs   CBC: Recent Labs  Lab 03/22/19 0341  03/23/19 0322 03/24/19 0225 03/25/19 0230 03/26/19 0235 03/26/19 0300 03/26/19 0642  WBC 7.0  --  8.3 13.4* 15.5*  --  21.0*  --   NEUTROABS 6.4  --  7.4 12.5* 14.7*  --  19.6*  --   HGB 9.7*   < > 9.2* 8.8* 8.8* 7.1* 7.8* 8.8*  HCT 30.5*   < > 30.1* 28.4* 29.5* 21.0* 25.4* 26.0*  MCV 91.6  --  96.2 94.4 95.8  --  93.7  --   PLT 122*  --  89* 71* 76*  --  74*  --    < > = values in this interval not displayed.    Basic Metabolic Panel: Recent Labs  Lab 03/22/19 1830 03/23/19 0322 03/23/19 1431 03/24/19 0225 03/24/19 1442 03/25/19 0230 03/26/19 0235 03/26/19  0300 03/26/19 0642  NA  --  144 143 144 145 146* 147* 146* 147*  K  --  4.6 4.9 4.4 4.4 4.9 4.8 4.6 4.4  CL  --  116* 118* 121* 123* 124*  --  124*  --   CO2  --  23 20* 19* 18* 18*  --  15*  --   GLUCOSE  --  278* 273* 195* 229* 286*  --  137*  --   BUN  --  93* 95* 98* 100* 83*  --  112*  --   CREATININE  --  4.04* 4.09* 4.31* 4.07* 3.94*  --  4.80*  --   CALCIUM  --  8.2* 7.9* 8.0* 7.7* 7.7*  --  7.5*  --   MG 2.5* 2.5*  --  2.6*  --  2.5*  --  2.6*  --   PHOS 5.8* 6.1*  --  4.5  --   --   --  3.5  --    GFR: Estimated Creatinine Clearance: 10.6 mL/min (A) (by C-G formula based on SCr of 4.8 mg/dL (H)). Recent Labs  Lab 03/23/19 0322 03/24/19 0225 03/25/19 0230 03/26/19 0300  WBC 8.3 13.4* 15.5* 21.0*    Liver Function Tests: Recent Labs  Lab 03/22/19 0341 03/23/19 0322 03/24/19 0225 03/25/19 0230 03/26/19 0300  AST 37 35 30 33 52*  ALT 21 20 20 21 22   ALKPHOS 73 79 89 108 153*  BILITOT 0.5 0.3 0.2* 0.4 0.3  PROT 5.8* 5.9* 5.4* 5.4* 5.3*  ALBUMIN 2.1* 2.0* 1.9* 1.9* 1.8*  1.8*   No results for input(s): LIPASE, AMYLASE in the last 168 hours. Recent Labs  Lab 03/24/19 0445  AMMONIA 39*    ABG    Component Value Date/Time   PHART 7.195 (LL) 03/26/2019 0642   PCO2ART 41.6 03/26/2019 0642   PO2ART 47.0 (L) 03/26/2019 0642   HCO3 16.0 (L) 03/26/2019 0642   TCO2 17 (L) 03/26/2019 0642   ACIDBASEDEF 11.0 (H) 03/26/2019 0642   O2SAT 73.0 03/26/2019 0642     Coagulation Profile: No results for input(s): INR, PROTIME in the last 168 hours.  Cardiac Enzymes: Recent Labs  Lab 03/22/19 0342  TROPONINI <0.03    HbA1C: Hgb A1c MFr Bld  Date/Time Value Ref Range Status  03/18/2019 10:45 AM 14.9 (H) 4.8 - 5.6 % Final    Comment:    (NOTE) Pre diabetes:          5.7%-6.4% Diabetes:              >6.4% Glycemic control for   <7.0% adults with diabetes   03/18/2019 01:18 AM 14.9 (H) 4.8 - 5.6 % Final    Comment:    (NOTE) Pre diabetes:           5.7%-6.4% Diabetes:              >  6.4% Glycemic control for   <7.0% adults with diabetes     CBG: Recent Labs  Lab 03/25/19 1605 03/25/19 2015 03/25/19 2354 03/26/19 0348 03/26/19 0822  GLUCAP 191* 171* 144* 104* 130*   The patient is critically ill with multiple organ systems failure and requires high complexity decision making for assessment and support, frequent evaluation and titration of therapies, application of advanced monitoring technologies and extensive interpretation of multiple databases.   Critical Care Time devoted to patient care services described in this note is  34  Minutes. This time reflects time of care of this signee Dr Jennet Maduro. This critical care time does not reflect procedure time, or teaching time or supervisory time of PA/NP/Med student/Med Resident etc but could involve care discussion time.  Rush Farmer, M.D. [facility] Pulmonary/Critical Care Medicine. Pager: [phone]. After hours pager: [phone].

## 2019-03-26 NOTE — Progress Notes (Signed)
CRRT initiated at 0315 Fluid removal at 64ml/hr for now to see if pt can tolerate. Will increase in order to achieve fluid removal goal of 75-200

## 2019-03-26 NOTE — Plan of Care (Signed)
  Problem: Education: Goal: Knowledge of risk factors and measures for prevention of condition will improve Outcome: Progressing   Problem: Coping: Goal: Psychosocial and spiritual needs will be supported Outcome: Progressing   Problem: Respiratory: Goal: Will maintain a patent airway Outcome: Progressing Goal: Complications related to the disease process, condition or treatment will be avoided or minimized Outcome: Progressing   Problem: Education: Goal: Knowledge of General Education information will improve Description: Including pain rating scale, medication(s)/side effects and non-pharmacologic comfort measures Outcome: Progressing   Problem: Health Behavior/Discharge Planning: Goal: Ability to manage health-related needs will improve Outcome: Progressing   Problem: Clinical Measurements: Goal: Ability to maintain clinical measurements within normal limits will improve Outcome: Progressing Goal: Will remain free from infection Outcome: Progressing Goal: Diagnostic test results will improve Outcome: Progressing Goal: Respiratory complications will improve Outcome: Progressing Goal: Cardiovascular complication will be avoided Outcome: Progressing   Problem: Activity: Goal: Risk for activity intolerance will decrease Outcome: Progressing   Problem: Nutrition: Goal: Adequate nutrition will be maintained Outcome: Progressing   Problem: Coping: Goal: Level of anxiety will decrease Outcome: Progressing   Problem: Elimination: Goal: Will not experience complications related to bowel motility Outcome: Progressing Goal: Will not experience complications related to urinary retention Outcome: Progressing   Problem: Pain Managment: Goal: General experience of comfort will improve Outcome: Progressing   Problem: Safety: Goal: Ability to remain free from injury will improve Outcome: Progressing   Problem: Skin Integrity: Goal: Risk for impaired skin integrity will  decrease Outcome: Progressing   Problem: Activity: Goal: Ability to tolerate increased activity will improve Outcome: Progressing   Problem: Respiratory: Goal: Ability to maintain a clear airway and adequate ventilation will improve Outcome: Progressing   Problem: Role Relationship: Goal: Method of communication will improve Outcome: Progressing   

## 2019-03-26 NOTE — Progress Notes (Signed)
OVERNIGHT COVERAGE CRITICAL CARE PROGRESS NOTE  BP downtrending on norepinephrine gtt.  RR 32. ABG: 7.17/61/39 on PC f 28, Pi 14 + PEEP, PEEP 10, FiO2 40%  Vent settings changed to PC f 28, Pi 20 + PEEP, PEEP 8, FiO2 100%.  ABG    Component Value Date/Time   PHART 7.278 (L) 03/26/2019 2220   PCO2ART 47.4 03/26/2019 2220   PO2ART 87.0 03/26/2019 2220   HCO3 22.3 03/26/2019 2220   TCO2 24 03/26/2019 2220   ACIDBASEDEF 4.0 (H) 03/26/2019 2220   O2SAT 95.0 03/26/2019 2220    Prompt  improvement in BP with increased VE. Wean FiO2 as tolerated.  Keep SpO2 > 88%. On Dilaudid gtt for sedation.  Add propofol to attempt to achieve ventilator synchrony.  Renee Pain, MD Board Certified by the ABIM, [place]

## 2019-03-26 NOTE — Progress Notes (Signed)
OVERNIGHT COVERAGE CRITICAL CARE PROGRESS NOTE  CTSP re: technical problems with HD catheter.  Please see nurse's note 5/27 2331.  15 cm Trialysis HD catheter was placed yesterday.  Too short.  Replaced with 20 cm Trialysis HD catheter over guidewire.  Renee Pain, MD Board Certified by the ABIM, [place]

## 2019-03-26 NOTE — Procedures (Signed)
Central Venous Catheter Insertion Procedure Note Brian Owen [id number] 1951-08-17  Procedure: Insertion of Central Venous Catheter Indications: Drug and/or fluid administration, Frequent blood sampling and continuous renal replacement therapy  Procedure Details Consent: Unable to obtain consent because of emergent medical necessity. Time Out: Verified patient identification, verified procedure, site/side was marked, verified correct patient position, special equipment/implants available, medications/allergies/relevent history reviewed, required imaging and test results available.  Performed  Maximum sterile technique was used including antiseptics, cap, gloves, gown, hand hygiene, mask and sheet. Skin prep: Chlorhexidine; local anesthetic administered A 13 Fr 20 cm Trialysis HD catheter was placed in the left internal jugular vein (replaced over guidewire).  Evaluation Blood flow good Complications: No apparent complications Patient did tolerate procedure well. Chest X-ray ordered to verify placement.  CXR: pending.  Renee Pain, MD Board Certified by the ABIM, Pulmonary Diseases & Critical Care Medicine  03/26/2019, 2:14 AM

## 2019-03-26 NOTE — Progress Notes (Addendum)
Dr. Carson Myrtle at bedside doing evening rounds, assessed patient and drew an ABG.   Original vent settings: Pressure Control FiO2 40% PEEP 10 Set rate 28 PC over PEEP 14  ABG    Component Value Date/Time   PHART 7.174 (L) 03/26/2019 2137   PCO2ART 60.7 03/26/2019 2137   PO2ART 39.0 (L) 03/26/2019 2137   HCO3 23.2 03/26/2019 2137   TCO2 25 03/26/2019 2137   ACIDBASEDEF 6.0 (H) 03/26/2019 2137   O2SAT 69.0 03/26/2019 2137   He then adjusted vent settings to the following at 2145: Pressure Control FiO2 100% PEEP 8 Set rate 28 PC over PEEP 20  Follow-Up ABG    Component Value Date/Time   PHART 7.278 (L) 03/26/2019 2220   PCO2ART 47.4 03/26/2019 2220   PO2ART 87.0 03/26/2019 2220   HCO3 22.3 03/26/2019 2220   TCO2 24 03/26/2019 2220   ACIDBASEDEF 4.0 (H) 03/26/2019 2220   O2SAT 95.0 03/26/2019 2220   Respiratory Therapy notified of these changes and documented in flowsheets. Per Dr. Carson Myrtle, will start propofol to wean down on Dilaudid if possible. Goal for vent synchrony, can increase sedation and pressors as needed for this. RT to titrate FiO2 per protocol.  Pt BP and HR improved with new vent settings. No concerns at this time. Will continue to monitor.  Henreitta Leber, RN 10:44 PM 03/26/19

## 2019-03-26 NOTE — Progress Notes (Signed)
Nutrition Follow-up   RD working remotely.  DOCUMENTATION CODES:   Not applicable  INTERVENTION:   Tube Feeding:  Continue Vital AF 1.2 at 50 ml/hr Provides 90 g of protein, 1440 kcals, 972 mL of free water Meets 100% protein and calorie needs  Add B-complex with C  NUTRITION DIAGNOSIS:   Inadequate oral intake related to inability to eat as evidenced by NPO status.  Being addressed via TF   GOAL:   Patient will meet greater than or equal to 90% of their needs  Met  MONITOR:   Vent status, TF tolerance, Labs, Weight trends  REASON FOR ASSESSMENT:   Ventilator, Consult Enteral/tube feeding initiation and management  ASSESSMENT:   68 year old male with HTN, DM, CKD stage 3, and autonomic dysfunction with orthostatic hypotension.  He was admitted with weakness and hypotension, tachypnea 5/19.  COVID-19 positive with bilateral pulmonary infiltrates.  New acute on chronic renal insufficiency.  Received Actemra x2, corticosteroids 5/21-5/23.  Experienced significant decline in his oxygenation and mental status beginning late 5/22, difficulty tolerating O2 mask, prone positioning. Moved to the ICU 5/23 with progressive hypoxic respiratory failure requiring intubation for mechanical ventilation.  5/19 Admit 5/23 Intubated 5/24 TF started 5/27 CRRT initiated  Patient is currently intubated on ventilator support, currently on levophed MV: 12.5 L/min Temp (24hrs), Avg:97.9 F (36.6 C), Min:96.6 F (35.9 C), Max:98.8 F (37.1 C)  Vital AF 1.2 at 50 ml/hr  Current wt 50.7 kg; admission weight 58.3 kg. Net + per I/O flow sheet Oliguric  Labs: sodium 147 (H), BUN 112 (H), Creatinine 4.80 (H), phosphorus wdl, corrected calcium 9.3 (wdl), albumin 1.8 Meds: ss novolog, levemir  Diet Order:   Diet Order            Diet NPO time specified  Diet effective now              EDUCATION NEEDS:   No education needs have been identified at this time  Skin:  Skin  Assessment: Reviewed RN Assessment  Last BM:  5/26  Height:   Ht Readings from Last 1 Encounters:  03/25/19 '5\' 1"'  (1.549 m)    Weight:   Wt Readings from Last 1 Encounters:  03/26/19 50.7 kg    Ideal Body Weight:  50.9 kg  BMI:  Body mass index is 21.12 kg/m.  Estimated Nutritional Needs:   Kcal:  1471 kcals   Protein:  85-100 g   Fluid:  >/= 1.5 L   Kerman Passey MS, RD, LDN, CNSC [phone] Pager  [phone] Weekend/On-Call Pager

## 2019-03-27 ENCOUNTER — Inpatient Hospital Stay (HOSPITAL_COMMUNITY): Payer: Medicare Other

## 2019-03-27 DIAGNOSIS — G9340 Encephalopathy, unspecified: Secondary | ICD-10-CM

## 2019-03-27 LAB — RENAL FUNCTION PANEL
Albumin: 2.1 g/dL — ABNORMAL LOW (ref 3.5–5.0)
Albumin: 2 g/dL — ABNORMAL LOW (ref 3.5–5.0)
Anion gap: 8 (ref 5–15)
Anion gap: 5 (ref 5–15)
BUN: 46 mg/dL — ABNORMAL HIGH (ref 8–23)
BUN: 33 mg/dL — ABNORMAL HIGH (ref 8–23)
CO2: 24 mmol/L (ref 22–32)
CO2: 25 mmol/L (ref 22–32)
Calcium: 7.1 mg/dL — ABNORMAL LOW (ref 8.9–10.3)
Calcium: 6.9 mg/dL — ABNORMAL LOW (ref 8.9–10.3)
Chloride: 109 mmol/L (ref 98–111)
Chloride: 108 mmol/L (ref 98–111)
Creatinine, Ser: 2.12 mg/dL — ABNORMAL HIGH (ref 0.61–1.24)
Creatinine, Ser: 1.47 mg/dL — ABNORMAL HIGH (ref 0.61–1.24)
GFR calc Af Amer: 36 mL/min — ABNORMAL LOW (ref >60)
GFR calc Af Amer: 56 mL/min — ABNORMAL LOW (ref >60)
GFR calc non Af Amer: 31 mL/min — ABNORMAL LOW (ref >60)
GFR calc non Af Amer: 48 mL/min — ABNORMAL LOW (ref >60)
Glucose, Bld: 139 mg/dL — ABNORMAL HIGH (ref 70–99)
Glucose, Bld: 80 mg/dL (ref 70–99)
Phosphorus: 2.5 mg/dL (ref 2.5–4.6)
Phosphorus: 2.3 mg/dL — ABNORMAL LOW (ref 2.5–4.6)
Potassium: 4.9 mmol/L (ref 3.5–5.1)
Potassium: 4.4 mmol/L (ref 3.5–5.1)
Sodium: 141 mmol/L (ref 135–145)
Sodium: 138 mmol/L (ref 135–145)

## 2019-03-27 LAB — POCT I-STAT 7, (LYTES, BLD GAS, ICA,H+H)
Acid-base deficit: 4 mmol/L — ABNORMAL HIGH (ref 0.0–2.0)
Bicarbonate: 22.6 mmol/L (ref 20.0–28.0)
Calcium, Ion: 1.11 mmol/L — ABNORMAL LOW (ref 1.15–1.40)
HCT: 23 % — ABNORMAL LOW (ref 39.0–52.0)
Hemoglobin: 7.8 g/dL — ABNORMAL LOW (ref 13.0–17.0)
O2 Saturation: 92 %
Patient temperature: 36.9
Potassium: 4.5 mmol/L (ref 3.5–5.1)
Sodium: 140 mmol/L (ref 135–145)
TCO2: 24 mmol/L (ref 22–32)
pCO2 arterial: 45.1 mmHg (ref 32.0–48.0)
pH, Arterial: 7.308 — ABNORMAL LOW (ref 7.350–7.450)
pO2, Arterial: 71 mmHg — ABNORMAL LOW (ref 83.0–108.0)

## 2019-03-27 LAB — CBC WITH DIFFERENTIAL/PLATELET
Abs Immature Granulocytes: 0.09 10*3/uL — ABNORMAL HIGH (ref 0.00–0.07)
Basophils Absolute: 0 10*3/uL (ref 0.0–0.1)
Basophils Relative: 0 %
Eosinophils Absolute: 0.2 10*3/uL (ref 0.0–0.5)
Eosinophils Relative: 1 %
HCT: 27.8 % — ABNORMAL LOW (ref 39.0–52.0)
Hemoglobin: 8.8 g/dL — ABNORMAL LOW (ref 13.0–17.0)
Immature Granulocytes: 1 %
Lymphocytes Relative: 6 %
Lymphs Abs: 0.8 10*3/uL (ref 0.7–4.0)
MCH: 29.3 pg (ref 26.0–34.0)
MCHC: 31.7 g/dL (ref 30.0–36.0)
MCV: 92.7 fL (ref 80.0–100.0)
Monocytes Absolute: 0.1 10*3/uL (ref 0.1–1.0)
Monocytes Relative: 1 %
Neutro Abs: 12.3 10*3/uL — ABNORMAL HIGH (ref 1.7–7.7)
Neutrophils Relative %: 91 %
Platelets: 75 10*3/uL — ABNORMAL LOW (ref 150–400)
RBC: 3 MIL/uL — ABNORMAL LOW (ref 4.22–5.81)
RDW: 16.1 % — ABNORMAL HIGH (ref 11.5–15.5)
WBC: 13.4 10*3/uL — ABNORMAL HIGH (ref 4.0–10.5)
nRBC: 0.4 % — ABNORMAL HIGH (ref 0.0–0.2)

## 2019-03-27 LAB — GLUCOSE, CAPILLARY
Glucose-Capillary: 124 mg/dL — ABNORMAL HIGH (ref 70–99)
Glucose-Capillary: 133 mg/dL — ABNORMAL HIGH (ref 70–99)
Glucose-Capillary: 139 mg/dL — ABNORMAL HIGH (ref 70–99)
Glucose-Capillary: 142 mg/dL — ABNORMAL HIGH (ref 70–99)
Glucose-Capillary: 172 mg/dL — ABNORMAL HIGH (ref 70–99)
Glucose-Capillary: 68 mg/dL — ABNORMAL LOW (ref 70–99)
Glucose-Capillary: 90 mg/dL (ref 70–99)
Glucose-Capillary: 96 mg/dL (ref 70–99)

## 2019-03-27 LAB — HEPATIC FUNCTION PANEL
ALT: 26 U/L (ref 0–44)
AST: 55 U/L — ABNORMAL HIGH (ref 15–41)
Albumin: 2.3 g/dL — ABNORMAL LOW (ref 3.5–5.0)
Alkaline Phosphatase: 186 U/L — ABNORMAL HIGH (ref 38–126)
Bilirubin, Direct: 0.1 mg/dL (ref 0.0–0.2)
Indirect Bilirubin: 0.3 mg/dL (ref 0.3–0.9)
Total Bilirubin: 0.4 mg/dL (ref 0.3–1.2)
Total Protein: 6.1 g/dL — ABNORMAL LOW (ref 6.5–8.1)

## 2019-03-27 LAB — APTT: aPTT: >200 seconds (ref 24–36)

## 2019-03-27 LAB — FERRITIN: Ferritin: 253 ng/mL (ref 24–336)

## 2019-03-27 LAB — C-REACTIVE PROTEIN: CRP: 10.5 mg/dL — ABNORMAL HIGH (ref <1.0)

## 2019-03-27 LAB — D-DIMER, QUANTITATIVE: D-Dimer, Quant: 2.51 ug/mL-FEU — ABNORMAL HIGH (ref 0.00–0.50)

## 2019-03-27 LAB — MAGNESIUM: Magnesium: 2.5 mg/dL — ABNORMAL HIGH (ref 1.7–2.4)

## 2019-03-27 MED ORDER — SODIUM CHLORIDE 0.9 % IV SOLN
2.0000 mg/kg/h | INTRAVENOUS | Status: DC
  Administered 2019-03-27: 11:00:00 2 mg/kg/h via INTRAVENOUS
  Filled 2019-03-27 (×2): qty 5

## 2019-03-27 MED ORDER — MIDODRINE HCL 5 MG PO TABS
10.0000 mg | ORAL_TABLET | Freq: Three times a day (TID) | ORAL | Status: DC
  Administered 2019-03-27 – 2019-03-31 (×14): 10 mg
  Filled 2019-03-27 (×17): qty 2

## 2019-03-27 MED ORDER — NOREPINEPHRINE 16 MG/250ML-% IV SOLN
0.0000 ug/min | INTRAVENOUS | Status: DC
  Administered 2019-03-27: 8 ug/min via INTRAVENOUS
  Administered 2019-03-28: 13 ug/min via INTRAVENOUS
  Administered 2019-03-29: 9.6 ug/min via INTRAVENOUS
  Administered 2019-03-30: 09:00:00 10 ug/min via INTRAVENOUS
  Administered 2019-03-31: 01:00:00 39 ug/min via INTRAVENOUS
  Administered 2019-03-31: 40 ug/min via INTRAVENOUS
  Administered 2019-03-31: 42 ug/min via INTRAVENOUS
  Filled 2019-03-27 (×4): qty 250
  Filled 2019-03-27: qty 500
  Filled 2019-03-27 (×2): qty 250

## 2019-03-27 MED ORDER — MIDAZOLAM HCL 2 MG/2ML IJ SOLN
1.0000 mg | INTRAMUSCULAR | Status: DC | PRN
  Administered 2019-03-28 – 2019-03-30 (×7): 2 mg via INTRAVENOUS
  Filled 2019-03-27 (×7): qty 2

## 2019-03-27 MED ORDER — DEXTROSE 50 % IV SOLN
25.0000 mL | Freq: Once | INTRAVENOUS | Status: AC
  Administered 2019-03-27: 25 mL via INTRAVENOUS

## 2019-03-27 MED ORDER — DEXMEDETOMIDINE HCL IN NACL 400 MCG/100ML IV SOLN: 0.4000 ug/kg/h | INTRAVENOUS | Status: DC

## 2019-03-27 MED ORDER — MIDODRINE HCL 5 MG PO TABS
10.0000 mg | ORAL_TABLET | Freq: Three times a day (TID) | ORAL | Status: DC
  Administered 2019-03-27: 09:00:00 10 mg
  Filled 2019-03-27 (×3): qty 2

## 2019-03-27 MED ORDER — SODIUM CHLORIDE 0.9 % IV SOLN
2.0000 mg/kg/h | INTRAVENOUS | Status: DC
  Administered 2019-03-27 – 2019-03-28 (×2): 2 mg/kg/h via INTRAVENOUS
  Filled 2019-03-27 (×9): qty 12.5

## 2019-03-27 MED ORDER — DEXTROSE 50 % IV SOLN
INTRAVENOUS | Status: AC
  Administered 2019-03-27: 25 mL via INTRAVENOUS
  Filled 2019-03-27: qty 50

## 2019-03-27 NOTE — Progress Notes (Addendum)
[facility] Progress Note  Subjective: CRRT running better. -1.8 L net neg yesterday. BUN 46 and creat 2.12 today.  CA 7.1, mg 2.5  Phos 2.5  Vitals:   03/27/19 1100 03/27/19 1137 03/27/19 1200 03/27/19 1230  BP: (!) 103/56 122/69 (!) 72/46 (!) 95/57  Pulse: 76 74 64 68  Resp: 20 (!) 28 (!) 28 (!) 28  Temp:   98.3 F (36.8 C)   TempSrc:   Oral   SpO2: 97% 99% 94% 95%  Weight:      Height:        Inpatient medications: . B-complex with vitamin C  1 tablet Per Tube Daily  . chlorhexidine  15 mL Mouth/Throat BID  . Chlorhexidine Gluconate Cloth  6 each Topical Q0600  . famotidine  20 mg Per Tube Daily  . heparin injection (subcutaneous)  5,000 Units Subcutaneous Q8H  . insulin aspart  0-15 Units Subcutaneous Q4H  . insulin detemir  15 Units Subcutaneous BID  . mouth rinse  15 mL Mouth Rinse 10 times per day  . midodrine  10 mg Per Tube Q8H  . pneumococcal 23 valent vaccine  0.5 mL Intramuscular Tomorrow-1000   .  prismasol BGK 4/2.5 400 mL/hr at 03/27/19 1200  .  prismasol BGK 4/2.5 200 mL/hr at 03/27/19 1051  . sodium chloride 10 mL/hr at 03/27/19 1100  . feeding supplement (VITAL AF 1.2 CAL) Stopped (03/27/19 1000)  . heparin 10,000 units/ 20 mL infusion syringe 1,000 Units/hr (03/27/19 1000)  . HYDROmorphone 2 mg/hr (03/27/19 1100)  . ketamine infusion 500 mg in sodium chloride 0.9% 100 mL    . norepinephrine (LEVOPHED) Adult infusion 8 mcg/min (03/27/19 1100)  . prismasol BGK 4/2.5 1,750 mL/hr at 03/27/19 1145   sodium chloride, acetaminophen (TYLENOL) oral liquid 160 mg/5 mL, acetaminophen, heparin, heparin, HYDROmorphone, lip balm, midazolam    date                Creat               eGFR  2009- 2016     0.6- 1.0  2017- 2019     1.2- 1.06 Nov 2018        2.0- 2.7            27- 34  Feb 2020        2.0- 2.3            33- 38  03/29/2019 3.36  admission   Exam: Patient not examined directly given COVID-19 + status, utilizing exam of the  primary team and observations of RN's.    Home meds:  - insulin glargine 9u qd   CXR 5/19 > Worsening hazy multifocal airspace opacities concerning for progression of viral pneumonia  CXR 5/27 > Increased interstitial and airspace disease compatible with infection and ARDS   UA 5/15 > 30 prot, 0-5 rbc/ wbc  UA 5/20 > 100 prot, 0-5 rbc/ wbc  Renal US 5/26 - Right nephrolithiasis.  Benign-appearing cyst in the left kidney measures 2.2 cm.  No evidence of hydronephrosis. Foley catheter decompresses the bladder.  I/O cumulative +14L and -4.2L, net +9.8 L   CVP = 6-8   Assessment: 1 AKI on CKD 3/4 -baseline creat 2.0. UOP may be dropping off. CXR's worse in appearance of edema.  CRRT started 5/27 via L IJ cath.  - cont CRRTd#2 - 1.8 L net neg yest,  pull fluid as tol 0- 125 /hr - cont IV heparin through CRRT circuit at  ^1000u/hr standing dose - FiO2 down 60% - on levo gtt     2  COVID + viral PNA - per CCM  3  IDDM  4  Tobacco smoker  5  Met acidosis - from A/ CRF/ resp infection, follow.     Rob  [facility] 03/27/2019, 12:44 PM  Iron/TIBC/Ferritin/ %Sat    Component Value Date/Time   IRON 59 05/15/2014 0538   TIBC 163 (L) 05/15/2014 0538   FERRITIN 253 03/27/2019 0145   IRONPCTSAT 36 05/15/2014 0538   Recent Labs  Lab 03/27/19 0145 03/27/19 0349  NA 141 140  K 4.9 4.5  CL 109  --   CO2 24  --   GLUCOSE 139*  --   BUN 46*  --   CREATININE 2.12*  --   CALCIUM 7.1*  --   PHOS 2.5  --   ALBUMIN 2.1*  --    Recent Labs  Lab 03/27/19 0140  AST 55*  ALT 26  ALKPHOS 186*  BILITOT 0.4  PROT 6.1*   Recent Labs  Lab 03/27/19 0140 03/27/19 0349  WBC 13.4*  --   HGB 8.8* 7.8*  HCT 27.8* 23.0*  PLT 75*  --       

## 2019-03-27 NOTE — Progress Notes (Signed)
PROGRESS NOTE  Brian Owen ZOX:[id number] DOB: 1951-08-30 DOA: 03/10/2019 PCP: Alfonse Spruce, FNP   LOS: 9 days   Brief Narrative / Interim history: 68 year old male with history of hypertension, diabetes, chronic kidney disease stage III, multiple prior admissions for orthostatic hypotension related to autonomic dysfunction-noncompliant with Midodrine, who was admitted to the hospital on 03/27/2019 with fever, worsening renal failure, as well as multifocal pneumonia on chest x-ray.  He was found to be Covid positive  Significant Events: 5/19 admit  5/22 saturations declining 5/23 transferred to ICU - intubated - R IJ CVL  COVID-19 specific Treatment: Actemra 5/21 & 5/23 Steroids 5/21 > 5/23  Subjective: Remains intubated on vent.  Hypotensive last night  Assessment & Plan: Principal Problem:   Acute respiratory disease due to COVID-19 virus Active Problems:   Type 2 diabetes mellitus with hyperglycemia, with long-term current use of insulin (HCC)   AKI (acute kidney injury) ([place])   Severe sepsis (HCC)   Acute respiratory failure with hypoxia (HCC)   Principal Problem Acute Hypoxic Respiratory Failure due to Covid-19 Viral Illness -Patient requires ongoing ventilator support, critical care following and managing vent and sedation, not ready for weaning trials  Vent Mode: PCV FiO2 (%):  [40 %-100 %] 70 % Set Rate:  [28 bmp] 28 bmp PEEP:  [8 cmH20-12 cmH20] 12 cmH20 Plateau Pressure:  [22 cmH20-27 cmH20] 26 cmH20  -Not a candidate for Remdesivir due to renal failure -Has received Actemra x2 -Has received Solu-Medrol x3 days up until 5/23  COVID-19 Labs  Recent Labs    03/25/19 0230 03/26/19 0300 03/27/19 0140 03/27/19 0145  DDIMER 2.91* 3.96* 2.51*  --   FERRITIN 226 247  --  253  CRP 5.0* 9.8*  --  10.5*    Lab Results  Component Value Date   SARSCOV2NAA POSITIVE (A) 03/04/2019     Active Problems:  Autonomic dysfunction /chronic orthostatic  hypotension -Cont midodrine per tube, midodrine dose increased  Acute kidney injury on chronic kidney disease stage III -Baseline creatinine around 2.0, urine output was minimal, did not respond to Lasix and patient was started on CRRT 5/28, nephrology consulted. Metabolic encephalopathy  -CVP 3, fluid removal per renal  Type 2 diabetes mellitus  -currently on 15 units of Levemir along with sliding scale, CBGs well-controlled  Mildly elevated troponin -Denied chest pain when alert - likely related to renal dysfunction and hypotension - Troponin normal in follow-up  Positive blood culture - Coag neg Staph + Diptheroids (1 of 2 blood cx) -Clinically most consistent with contamination - antibiotics not indicated for this issue at present   Scheduled Meds: . B-complex with vitamin C  1 tablet Per Tube Daily  . chlorhexidine  15 mL Mouth/Throat BID  . Chlorhexidine Gluconate Cloth  6 each Topical Q0600  . famotidine  20 mg Per Tube Daily  . heparin injection (subcutaneous)  5,000 Units Subcutaneous Q8H  . insulin aspart  0-15 Units Subcutaneous Q4H  . insulin detemir  15 Units Subcutaneous BID  . mouth rinse  15 mL Mouth Rinse 10 times per day  . midodrine  10 mg Per Tube Q8H  . pneumococcal 23 valent vaccine  0.5 mL Intramuscular Tomorrow-1000   Continuous Infusions: .  prismasol BGK 4/2.5 400 mL/hr at 03/27/19 0900  .  prismasol BGK 4/2.5 200 mL/hr at 03/27/19 1051  . sodium chloride 10 mL/hr at 03/27/19 1100  . feeding supplement (VITAL AF 1.2 CAL) Stopped (03/27/19 1000)  . heparin 10,000 units/ 20  mL infusion syringe 1,000 Units/hr (03/27/19 1000)  . HYDROmorphone 2 mg/hr (03/27/19 1100)  . ketamine infusion 500 mg in sodium chloride 0.9% 100 mL    . norepinephrine (LEVOPHED) Adult infusion 8 mcg/min (03/27/19 1100)  . prismasol BGK 4/2.5 1,750 mL/hr at 03/27/19 1051   PRN Meds:.sodium chloride, acetaminophen (TYLENOL) oral liquid 160 mg/5 mL, acetaminophen, heparin, heparin,  HYDROmorphone, lip balm, midazolam  DVT prophylaxis: heparin Code Status: Full code Family Communication: none  Disposition Plan: TBD  Consultants:   PCCM   Procedures:   None   Antimicrobials: Azithro 5/19 x 1 Ceftriaxone 5/19 x 1 Cefepime 5/20 >>off vanco 5/20 >> 5/22   Objective: Vitals:   03/27/19 1000 03/27/19 1100 03/27/19 1137 03/27/19 1200  BP: 104/65 (!) 103/56 122/69 (!) 72/46  Pulse: 75 76 74 64  Resp: (!) 24 20 (!) 28 (!) 28  Temp:    98.3 F (36.8 C)  TempSrc:    Oral  SpO2: 96% 97% 99% 94%  Weight:      Height:        Intake/Output Summary (Last 24 hours) at 03/27/2019 1215 Last data filed at 03/27/2019 1200 Gross per 24 hour  Intake 2497.73 ml  Output 4049 ml  Net -1551.27 ml   Filed Weights   03/25/19 0533 03/26/19 0245 03/27/19 0414  Weight: 52.8 kg 50.7 kg 60 kg    Examination:  Constitutional: Intubated, sedated Eyes: No icterus seen ENMT: Dry mucous membranes Neck: normal, supple Respiratory: Breathing on the vent, no wheezing, no crackles, diminished breath sounds Cardiovascular: Regular rate and rhythm, no murmurs appreciated Abdomen: Soft, nontender, nondistended, bowel sounds positive Musculoskeletal: no clubbing / cyanosis.  Skin: No new rashes Neurologic: Sedated on vent   Data Reviewed: I have independently reviewed following labs and imaging studies   CBC: Recent Labs  Lab 03/23/19 0322 03/24/19 0225 03/25/19 0230  03/26/19 0300 03/26/19 0642 03/26/19 2137 03/26/19 2220 03/27/19 0140 03/27/19 0349  WBC 8.3 13.4* 15.5*  --  21.0*  --   --   --  13.4*  --   NEUTROABS 7.4 12.5* 14.7*  --  19.6*  --   --   --  12.3*  --   HGB 9.2* 8.8* 8.8*   < > 7.8* 8.8* 8.8* 8.5* 8.8* 7.8*  HCT 30.1* 28.4* 29.5*   < > 25.4* 26.0* 26.0* 25.0* 27.8* 23.0*  MCV 96.2 94.4 95.8  --  93.7  --   --   --  92.7  --   PLT 89* 71* 76*  --  74*  --   --   --  75*  --    < > = values in this interval not displayed.   Basic Metabolic  Panel: Recent Labs  Lab 03/23/19 0322  03/24/19 0225 03/24/19 1442 03/25/19 0230  03/26/19 0300  03/26/19 1556 03/26/19 2137 03/26/19 2220 03/27/19 0140 03/27/19 0145 03/27/19 0349  NA 144   < > 144 145 146*   < > 146*   < > 142 142 141  --  141 140  K 4.6   < > 4.4 4.4 4.9   < > 4.6   < > 4.4 4.6 4.8  --  4.9 4.5  CL 116*   < > 121* 123* 124*  --  124*  --  113*  --   --   --  109  --   CO2 23   < > 19* 18* 18*  --  15*  --  23  --   --   --  24  --   GLUCOSE 278*   < > 195* 229* 286*  --  137*  --  177*  --   --   --  139*  --   BUN 93*   < > 98* 100* 83*  --  112*  --  64*  --   --   --  46*  --   CREATININE 4.04*   < > 4.31* 4.07* 3.94*  --  4.80*  --  2.84*  --   --   --  2.12*  --   CALCIUM 8.2*   < > 8.0* 7.7* 7.7*  --  7.5*  --  7.1*  --   --   --  7.1*  --   MG 2.5*  --  2.6*  --  2.5*  --  2.6*  --   --   --   --  2.5*  --   --   PHOS 6.1*  --  4.5  --   --   --  3.5  --  3.0  --   --   --  2.5  --    < > = values in this interval not displayed.   GFR: Estimated Creatinine Clearance: 24.7 mL/min (A) (by C-G formula based on SCr of 2.12 mg/dL (H)). Liver Function Tests: Recent Labs  Lab 03/23/19 0322 03/24/19 0225 03/25/19 0230 03/26/19 0300 03/26/19 1556 03/27/19 0140 03/27/19 0145  AST 35 30 33 52*  --  55*  --   ALT 20 20 21 22   --  26  --   ALKPHOS 79 89 108 153*  --  186*  --   BILITOT 0.3 0.2* 0.4 0.3  --  0.4  --   PROT 5.9* 5.4* 5.4* 5.3*  --  6.1*  --   ALBUMIN 2.0* 1.9* 1.9* 1.8*  1.8* 1.9* 2.3* 2.1*   No results for input(s): LIPASE, AMYLASE in the last 168 hours. Recent Labs  Lab 03/24/19 0445  AMMONIA 39*   Coagulation Profile: No results for input(s): INR, PROTIME in the last 168 hours. Cardiac Enzymes: Recent Labs  Lab 03/22/19 0342  TROPONINI <0.03   BNP (last 3 results) No results for input(s): PROBNP in the last 8760 hours. HbA1C: No results for input(s): HGBA1C in the last 72 hours. CBG: Recent Labs  Lab 03/26/19 1942  03/27/19 0004 03/27/19 0347 03/27/19 0812 03/27/19 1158  GLUCAP 171* 172* 133* 96 90   Lipid Profile: Recent Labs    03/26/19 2205  TRIG 277*   Thyroid Function Tests: No results for input(s): TSH, T4TOTAL, FREET4, T3FREE, THYROIDAB in the last 72 hours. Anemia Panel: Recent Labs    03/26/19 0300 03/27/19 0145  FERRITIN 247 253   Urine analysis:    Component Value Date/Time   COLORURINE YELLOW 03/18/2019 0048   APPEARANCEUR CLEAR 03/18/2019 0048   LABSPEC 1.013 03/18/2019 0048   PHURINE 5.0 03/18/2019 0048   GLUCOSEU >=500 (A) 03/18/2019 0048   HGBUR LARGE (A) 03/18/2019 0048   BILIRUBINUR NEGATIVE 03/18/2019 0048   BILIRUBINUR neg 09/05/2017 1111   KETONESUR 5 (A) 03/18/2019 0048   PROTEINUR 100 (A) 03/18/2019 0048   UROBILINOGEN 0.2 09/05/2017 1111   UROBILINOGEN 0.2 07/22/2015 1941   NITRITE NEGATIVE 03/18/2019 0048   LEUKOCYTESUR NEGATIVE 03/18/2019 0048   Sepsis Labs: Invalid input(s): PROCALCITONIN, LACTICIDVEN  Recent Results (from the past 240 hour(s))  Blood Culture (routine x 2)  Status: None   Collection Time: 03/05/2019  8:24 PM  Result Value Ref Range Status   Specimen Description BLOOD LEFT FOREARM  Final   Special Requests   Final    BOTTLES DRAWN AEROBIC ONLY Blood Culture adequate volume   Culture   Final    NO GROWTH 5 DAYS Performed at [facility], [address], [place], [place] [address]    Report Status 03/22/2019 FINAL  Final  Blood Culture (routine x 2)     Status: Abnormal   Collection Time: 03/02/2019 10:03 PM  Result Value Ref Range Status   Specimen Description BLOOD RIGHT IV  Final   Special Requests   Final    BOTTLES DRAWN AEROBIC AND ANAEROBIC Blood Culture results may not be optimal due to an excessive volume of blood received in culture bottles   Culture  Setup Time   Final    GRAM POSITIVE COCCI ANAEROBIC BOTTLE ONLY Organism ID to follow CRITICAL RESULT CALLED TO, READ BACK BY AND VERIFIED WITH: Jene Every PharmD  16:05 03/18/19 ([email or web address]) AEROBIC BOTTLE ONLY GRAM POSITIVE RODS CRITICAL RESULT CALLED TO, READ BACK BY AND VERIFIED WITH: E WILLIAMSON PHARMD 03/18/19 2128 JDW    Culture (A)  Final    STAPHYLOCOCCUS SPECIES (COAGULASE NEGATIVE) DIPHTHEROIDS(CORYNEBACTERIUM SPECIES) THE SIGNIFICANCE OF ISOLATING THIS ORGANISM FROM A SINGLE SET OF BLOOD CULTURES WHEN MULTIPLE SETS ARE DRAWN IS UNCERTAIN. PLEASE NOTIFY THE MICROBIOLOGY DEPARTMENT WITHIN ONE WEEK IF SPECIATION AND SENSITIVITIES ARE REQUIRED. Performed at [facility], [place] [address], [place], [place] [address]    Report Status 03/19/2019 FINAL  Final  Blood Culture ID Panel (Reflexed)     Status: Abnormal   Collection Time: 03/06/2019 10:03 PM  Result Value Ref Range Status   Enterococcus species NOT DETECTED NOT DETECTED Final   Listeria monocytogenes NOT DETECTED NOT DETECTED Final   Staphylococcus species DETECTED (A) NOT DETECTED Final    Comment: Methicillin (oxacillin) resistant coagulase negative staphylococcus. Possible blood culture contaminant (unless isolated from more than one blood culture draw or clinical case suggests pathogenicity). No antibiotic treatment is indicated for blood  culture contaminants. CRITICAL RESULT CALLED TO, READ BACK BY AND VERIFIED WITH: Jene Every PharmD 16:05 03/18/19 ([email or web address])    Staphylococcus aureus (BCID) NOT DETECTED NOT DETECTED Final   Methicillin resistance DETECTED (A) NOT DETECTED Final    Comment: CRITICAL RESULT CALLED TO, READ BACK BY AND VERIFIED WITH: Jene Every PharmD 16:05 03/18/19 ([email or web address])    Streptococcus species NOT DETECTED NOT DETECTED Final   Streptococcus agalactiae NOT DETECTED NOT DETECTED Final   Streptococcus pneumoniae NOT DETECTED NOT DETECTED Final   Streptococcus pyogenes NOT DETECTED NOT DETECTED Final   Acinetobacter baumannii NOT DETECTED NOT DETECTED Final   Enterobacteriaceae species NOT DETECTED NOT DETECTED Final   Enterobacter cloacae complex NOT DETECTED NOT  DETECTED Final   Escherichia coli NOT DETECTED NOT DETECTED Final   Klebsiella oxytoca NOT DETECTED NOT DETECTED Final   Klebsiella pneumoniae NOT DETECTED NOT DETECTED Final   Proteus species NOT DETECTED NOT DETECTED Final   Serratia marcescens NOT DETECTED NOT DETECTED Final   Haemophilus influenzae NOT DETECTED NOT DETECTED Final   Neisseria meningitidis NOT DETECTED NOT DETECTED Final   Pseudomonas aeruginosa NOT DETECTED NOT DETECTED Final   Candida albicans NOT DETECTED NOT DETECTED Final   Candida glabrata NOT DETECTED NOT DETECTED Final   Candida krusei NOT DETECTED NOT DETECTED Final   Candida parapsilosis NOT DETECTED NOT DETECTED Final  Candida tropicalis NOT DETECTED NOT DETECTED Final    Comment: Performed at [facility], [place] [address], [place], [place] [address]  SARS Coronavirus 2 (CEPHEID- Performed in [place] hospital lab), Hosp Order     Status: Abnormal   Collection Time: 02/27/2019 11:52 PM  Result Value Ref Range Status   SARS Coronavirus 2 POSITIVE (A) NEGATIVE Final    Comment: RESULT CALLED TO, READ BACK BY AND VERIFIED WITH: P JOHNSTON RN 03/17/18 0131 JDW (NOTE) If result is NEGATIVE SARS-CoV-2 target nucleic acids are NOT DETECTED. The SARS-CoV-2 RNA is generally detectable in upper and lower  respiratory specimens during the acute phase of infection. The lowest  concentration of SARS-CoV-2 viral copies this assay can detect is 250  copies / mL. A negative result does not preclude SARS-CoV-2 infection  and should not be used as the sole basis for treatment or other  patient management decisions.  A negative result may occur with  improper specimen collection / handling, submission of specimen other  than nasopharyngeal swab, presence of viral mutation(s) within the  areas targeted by this assay, and inadequate number of viral copies  (<250 copies / mL). A negative result must be combined with clinical  observations, patient history, and  epidemiological information. If result is POSITIVE SARS-CoV-2 target nucleic acids are DETECTED. The SA RS-CoV-2 RNA is generally detectable in upper and lower  respiratory specimens during the acute phase of infection.  Positive  results are indicative of active infection with SARS-CoV-2.  Clinical  correlation with patient history and other diagnostic information is  necessary to determine patient infection status.  Positive results do  not rule out bacterial infection or co-infection with other viruses. If result is PRESUMPTIVE POSTIVE SARS-CoV-2 nucleic acids MAY BE PRESENT.   A presumptive positive result was obtained on the submitted specimen  and confirmed on repeat testing.  While 2019 novel coronavirus  (SARS-CoV-2) nucleic acids may be present in the submitted sample  additional confirmatory testing may be necessary for epidemiological  and / or clinical management purposes  to differentiate between  SARS-CoV-2 and other Sarbecovirus currently known to infect humans.  If clinically indicated additional testing with an alternate test  methodology [phone] is advi sed. The SARS-CoV-2 RNA is generally  detectable in upper and lower respiratory specimens during the acute  phase of infection. The expected result is Negative. Fact Sheet for Patients:  StrictlyIdeas.no Fact Sheet for Healthcare Providers: BankingDealers.co.za This test is not yet approved or cleared by the [place] FDA and has been authorized for detection and/or diagnosis of SARS-CoV-2 by FDA under an Emergency Use Authorization (EUA).  This EUA will remain in effect (meaning this test can be used) for the duration of the COVID-19 declaration under Section 564(b)(1) of the Act, 21 U.S.C. section 360bbb-3(b)(1), unless the authorization is terminated or revoked sooner. Performed at [facility], [place] [address], [place], [place] [address]   Urine culture      Status: Abnormal   Collection Time: 03/18/19 12:46 AM  Result Value Ref Range Status   Specimen Description URINE, CLEAN CATCH  Final   Special Requests NONE  Final   Culture (A)  Final    <10,000 COLONIES/mL INSIGNIFICANT GROWTH Performed at [facility], [address] [place], [place] [address]    Report Status 03/19/2019 FINAL  Final  Culture, blood (routine x 2) Call MD if unable to obtain prior to antibiotics being given  Status: None   Collection Time: 03/18/19  4:00 PM  Result Value Ref Range Status   Specimen Description   Final    BLOOD RIGHT ARM Performed at [facility] [address] [place], [place] [address]    Special Requests   Final    BOTTLES DRAWN AEROBIC ONLY Blood Culture results may not be optimal due to an inadequate volume of blood received in culture bottles   Culture   Final    NO GROWTH 5 DAYS Performed at [facility], [place] [address] [place], [place] [address]    Report Status 03/23/2019 FINAL  Final  Culture, blood (routine x 2) Call MD if unable to obtain prior to antibiotics being given     Status: None   Collection Time: 03/18/19  4:00 PM  Result Value Ref Range Status   Specimen Description   Final    BLOOD RIGHT HAND Performed at [facility] [address] [place], [place] [address]    Special Requests   Final    BOTTLES DRAWN AEROBIC ONLY Blood Culture results may not be optimal due to an inadequate volume of blood received in culture bottles   Culture   Final    NO GROWTH 5 DAYS Performed at [facility], [place] [address], [place], [place] [address]    Report Status 03/23/2019 FINAL  Final  MRSA PCR Screening     Status: None   Collection Time: 03/19/19  2:00 PM  Result Value Ref Range Status   MRSA by PCR NEGATIVE NEGATIVE Final    Comment:        The GeneXpert MRSA Assay (FDA approved for NASAL specimens only), is one component of a comprehensive MRSA colonization  surveillance program. It is not intended to diagnose MRSA infection nor to guide or monitor treatment for MRSA infections. Performed at [facility], [place] [address], [place], [place] [address]   Culture, respiratory (non-expectorated)     Status: None   Collection Time: 03/21/19  2:42 PM  Result Value Ref Range Status   Specimen Description   Final    TRACHEAL ASPIRATE Performed at [facility] [address], [place], [place] [address]    Special Requests   Final    NONE Performed at [facility], [place] [address], [place], [place] [address]    Gram Stain   Final    RARE WBC PRESENT,BOTH PMN AND MONONUCLEAR RARE YEAST Performed at [facility], [place] [address], [place], [place] [address]    Culture FEW CANDIDA ALBICANS  Final   Report Status 03/24/2019 FINAL  Final      Radiology Studies: Dg Chest Port 1 View  Result Date: 03/27/2019 CLINICAL DATA:  Intubation. EXAM: PORTABLE CHEST 1 VIEW COMPARISON:  03/26/2019. FINDINGS: Tracheostomy tube, NG tube, right IJ line, left IJ line in stable position. Heart size normal. Diffuse bilateral severe pulmonary infiltrates are again noted and unchanged. Small right pleural effusion cannot be excluded. IMPRESSION: 1.  Lines and tubes in stable position. 2. Diffuse bilateral severe pulmonary infiltrates are again noted and unchanged small right pleural effusion cannot be excluded. Electronically Signed   By: Marcello Moores  Register   On: 03/27/2019 07:42   Dg Chest Port 1 View  Result Date: 03/26/2019 CLINICAL DATA:  Follow-up pneumonia EXAM: PORTABLE CHEST 1 VIEW COMPARISON:  03/25/2019, 03/23/2019, 03/22/2019 FINDINGS: Endotracheal tube tip is about 3.9 cm superior to the carina. Esophageal  tube tip overlies the gastric body. Bilateral central venous catheter tips over the SVC. Overall no significant interval change in extensive ground-glass opacities and consolidations within the  bilateral lungs compared to radiographs earlier the same day. Stable cardiomediastinal silhouette. Aortic atherosclerosis. No pneumothorax. IMPRESSION: 1. Support lines and tubes as above 2. Overall no significant interval change in extensive bilateral ground-glass opacity and consolidations as compared with radiographs performed earlier today. Electronically Signed   By: Donavan Foil M.D.   On: 03/26/2019 02:31   Dg Chest Port 1 View  Result Date: 03/25/2019 CLINICAL DATA:  Pneumonia EXAM: PORTABLE CHEST 1 VIEW COMPARISON:  03/25/2019 FINDINGS: The endotracheal tube terminates just above the carina. The right-sided central venous catheter is stable. The enteric tube terminates over the gastric body. Diffuse bilateral hazy airspace opacities are again noted. These have not significantly improved from prior study given differences in technique. There is no pneumothorax. IMPRESSION: 1. Lines and tubes as above. 2. Diffuse hazy bilateral airspace opacities, not significantly improved from prior study given differences in technique and patient positioning. Electronically Signed   By: Constance Holster M.D.   On: 03/25/2019 17:50    Marzetta Board, MD, PhD [facility]  Contact via  [email or web address]  [place] P: [phone]  F: [phone]

## 2019-03-27 NOTE — Progress Notes (Signed)
NAMEKaikoa Owen, MRN:  [id number], DOB:  01-23-1951, LOS: 9 ADMISSION DATE:  03/14/2019, CONSULTATION DATE: Mar 21, 2019 REFERRING MD: Dr. Thereasa Solo, CHIEF COMPLAINT: Dyspnea  Brief History   68 year old male admitted on May 19 with COVID-19 pneumonia.  Developed acute kidney injury and worsening hypoxemic respiratory failure requiring intubation and mechanical ventilation on Mar 21, 2019.  Past Medical History  CKD Cigarette smoker Hypertension Diabetes mellitus type 2  Significant Hospital Events     Consults:  PCCM  Procedures:  ET tube 5/23 >>  Right IJ CVC 5/23 >>   Significant Diagnostic Tests:  May 25 CT head no acute intracranial process  Micro Data:  SARS CoV2 5/19 >> positive Urine 5/20 >> insignificant growth Blood 5/19 >> 1 of 2 MRSE (? Contaminant) Blood 5/20 >>  Resp 5/23 >>   Antimicrobials:  Azithro 5/19 x 1 Ceftriaxone 5/19 x 1 Cefepime 5/20 >> off vanco 5/20 >> 5/22  Interim history/subjective:   No events overnight Remains hypoxemic  Objective   Blood pressure (!) 115/57, pulse 88, temperature 98.4 F (36.9 C), temperature source Oral, resp. rate (!) 22, height 5\' 1"  (1.549 m), weight 60 kg, SpO2 96 %. CVP:  [6 mmHg-8 mmHg] 6 mmHg  Vent Mode: PCV FiO2 (%):  [40 %-100 %] 80 % Set Rate:  [28 bmp] 28 bmp Vt Set:  [83 mL] 14 mL PEEP:  [8 cmH20-14 cmH20] 8 cmH20 Plateau Pressure:  [21 cmH20-26 cmH20] 26 cmH20   Intake/Output Summary (Last 24 hours) at 03/27/2019 0758 Last data filed at 03/27/2019 0715 Gross per 24 hour  Intake 2518.72 ml  Output 4340 ml  Net -1821.28 ml   Filed Weights   03/25/19 0533 03/26/19 0245 03/27/19 0414  Weight: 52.8 kg 50.7 kg 60 kg   Examination:  General: Acutely ill appearing male, NAD HENT: [place]/AT, PERRL, EOM-I and MMM, ETT in place PULM: Diminished BS diffusely CV: RRR, Nl S1/S2 and -M/R/G GI: Soft, NT, ND and +BS MSK: normal bulk and tone Neuro: Sedated, on vent  I reviewed CXR myself, ETT is in a  good position, infiltrate noted  Resolved Hospital Problem list     Assessment & Plan:  Acute respiratory failure with hypoxemia due to COVID-19: Chest x-ray findings and oxygenation worsening on May 27 which I think is due to acute pulmonary edema Decrease PEEP to 10 Titrate O2 for sat of 88-92% Full vent support Hold wake-up assessment given worsening ventilator needs Fluid negative via CRRT as able D-dimer 2.51 SQ heparin CRP 10.5 already got actemra, not a candid for remdesivir due to renal failure, received 3 day of steroids, nothing further to add in regards to COVID  Acute kidney injury: give oliguria and worsening CXR findings, I suspect he has acute pulm edema CRRT -50, will attempt more if BP allows Monitor urine output Monitor labs  Autonomic instability/hypotension: Shock resolved/chronic hypotension again worse 5/27 but no clear sign of sepsis Resume norepinehrpine titrated to mean arterial pressure greater than 65 Increase Midodrine, increase to 10 mg q8  Need for mechanical ventilation, sedation for ventilator synchrony: Intermittent periods of unresponsiveness: Felt to be related to narcotics, head CT unremarkable Continue dilaudid infusion PRN versed 1-2 mg IV q2 hours D/C propofol  Best practice:  Diet: Tube feeding Pain/Anxiety/Delirium protocol (if indicated): yes, RASS-1 to -2 VAP protocol (if indicated): yes DVT prophylaxis: sub q heparin GI prophylaxis: famotidine Glucose control: SSI Mobility: bedrest Code Status: full Family Communication: per [facility] Disposition: remain in ICU  Labs   CBC: Recent Labs  Lab 03/23/19 0322 03/24/19 0225 03/25/19 0230  03/26/19 0300 03/26/19 0623 03/26/19 2137 03/26/19 2220 03/27/19 0140 03/27/19 0349  WBC 8.3 13.4* 15.5*  --  21.0*  --   --   --  13.4*  --   NEUTROABS 7.4 12.5* 14.7*  --  19.6*  --   --   --  12.3*  --   HGB 9.2* 8.8* 8.8*   < > 7.8* 8.8* 8.8* 8.5* 8.8* 7.8*  HCT 30.1* 28.4* 29.5*   < >  25.4* 26.0* 26.0* 25.0* 27.8* 23.0*  MCV 96.2 94.4 95.8  --  93.7  --   --   --  92.7  --   PLT 89* 71* 76*  --  74*  --   --   --  75*  --    < > = values in this interval not displayed.    Basic Metabolic Panel: Recent Labs  Lab 03/23/19 0322  03/24/19 0225 03/24/19 1442 03/25/19 0230  03/26/19 0300  03/26/19 1556 03/26/19 2137 03/26/19 2220 03/27/19 0140 03/27/19 0145 03/27/19 0349  NA 144   < > 144 145 146*   < > 146*   < > 142 142 141  --  141 140  K 4.6   < > 4.4 4.4 4.9   < > 4.6   < > 4.4 4.6 4.8  --  4.9 4.5  CL 116*   < > 121* 123* 124*  --  124*  --  113*  --   --   --  109  --   CO2 23   < > 19* 18* 18*  --  15*  --  23  --   --   --  24  --   GLUCOSE 278*   < > 195* 229* 286*  --  137*  --  177*  --   --   --  139*  --   BUN 93*   < > 98* 100* 83*  --  112*  --  64*  --   --   --  46*  --   CREATININE 4.04*   < > 4.31* 4.07* 3.94*  --  4.80*  --  2.84*  --   --   --  2.12*  --   CALCIUM 8.2*   < > 8.0* 7.7* 7.7*  --  7.5*  --  7.1*  --   --   --  7.1*  --   MG 2.5*  --  2.6*  --  2.5*  --  2.6*  --   --   --   --  2.5*  --   --   PHOS 6.1*  --  4.5  --   --   --  3.5  --  3.0  --   --   --  2.5  --    < > = values in this interval not displayed.   GFR: Estimated Creatinine Clearance: 24.7 mL/min (A) (by C-G formula based on SCr of 2.12 mg/dL (H)). Recent Labs  Lab 03/24/19 0225 03/25/19 0230 03/26/19 0300 03/27/19 0140  WBC 13.4* 15.5* 21.0* 13.4*   Liver Function Tests: Recent Labs  Lab 03/23/19 0322 03/24/19 0225 03/25/19 0230 03/26/19 0300 03/26/19 1556 03/27/19 0140 03/27/19 0145  AST 35 30 33 52*  --  55*  --   ALT 20 20 21 22   --  26  --   ALKPHOS 79  89 108 153*  --  186*  --   BILITOT 0.3 0.2* 0.4 0.3  --  0.4  --   PROT 5.9* 5.4* 5.4* 5.3*  --  6.1*  --   ALBUMIN 2.0* 1.9* 1.9* 1.8*  1.8* 1.9* 2.3* 2.1*   No results for input(s): LIPASE, AMYLASE in the last 168 hours. Recent Labs  Lab 03/24/19 0445  AMMONIA 39*   ABG     Component Value Date/Time   PHART 7.308 (L) 03/27/2019 0349   PCO2ART 45.1 03/27/2019 0349   PO2ART 71.0 (L) 03/27/2019 0349   HCO3 22.6 03/27/2019 0349   TCO2 24 03/27/2019 0349   ACIDBASEDEF 4.0 (H) 03/27/2019 0349   O2SAT 92.0 03/27/2019 0349    Coagulation Profile: No results for input(s): INR, PROTIME in the last 168 hours.  Cardiac Enzymes: Recent Labs  Lab 03/22/19 0342  TROPONINI <0.03   HbA1C: Hgb A1c MFr Bld  Date/Time Value Ref Range Status  03/18/2019 10:45 AM 14.9 (H) 4.8 - 5.6 % Final    Comment:    (NOTE) Pre diabetes:          5.7%-6.4% Diabetes:              >6.4% Glycemic control for   <7.0% adults with diabetes   03/18/2019 01:18 AM 14.9 (H) 4.8 - 5.6 % Final    Comment:    (NOTE) Pre diabetes:          5.7%-6.4% Diabetes:              >6.4% Glycemic control for   <7.0% adults with diabetes    CBG: Recent Labs  Lab 03/26/19 1203 03/26/19 1553 03/26/19 1942 03/27/19 0004 03/27/19 0347  GLUCAP 156* 146* 171* 172* 133*   The patient is critically ill with multiple organ systems failure and requires high complexity decision making for assessment and support, frequent evaluation and titration of therapies, application of advanced monitoring technologies and extensive interpretation of multiple databases.   Critical Care Time devoted to patient care services described in this note is  32  Minutes. This time reflects time of care of this signee Dr Jennet Maduro. This critical care time does not reflect procedure time, or teaching time or supervisory time of PA/NP/Med student/Med Resident etc but could involve care discussion time.  Rush Farmer, M.D. [facility] Pulmonary/Critical Care Medicine. Pager: [phone]. After hours pager: [phone].

## 2019-03-28 LAB — HEPATIC FUNCTION PANEL
ALT: 24 U/L (ref 0–44)
AST: 49 U/L — ABNORMAL HIGH (ref 15–41)
Albumin: 1.9 g/dL — ABNORMAL LOW (ref 3.5–5.0)
Alkaline Phosphatase: 159 U/L — ABNORMAL HIGH (ref 38–126)
Bilirubin, Direct: 0.1 mg/dL (ref 0.0–0.2)
Indirect Bilirubin: 0 mg/dL — ABNORMAL LOW (ref 0.3–0.9)
Total Bilirubin: 0.1 mg/dL — ABNORMAL LOW (ref 0.3–1.2)
Total Protein: 5.5 g/dL — ABNORMAL LOW (ref 6.5–8.1)

## 2019-03-28 LAB — POCT I-STAT 7, (LYTES, BLD GAS, ICA,H+H)
Acid-base deficit: 4 mmol/L — ABNORMAL HIGH (ref 0.0–2.0)
Acid-base deficit: 3 mmol/L — ABNORMAL HIGH (ref 0.0–2.0)
Acid-base deficit: 2 mmol/L (ref 0.0–2.0)
Bicarbonate: 23.4 mmol/L (ref 20.0–28.0)
Bicarbonate: 23.8 mmol/L (ref 20.0–28.0)
Bicarbonate: 22.1 mmol/L (ref 20.0–28.0)
Calcium, Ion: 1.1 mmol/L — ABNORMAL LOW (ref 1.15–1.40)
Calcium, Ion: 1.1 mmol/L — ABNORMAL LOW (ref 1.15–1.40)
Calcium, Ion: 1.08 mmol/L — ABNORMAL LOW (ref 1.15–1.40)
HCT: 23 % — ABNORMAL LOW (ref 39.0–52.0)
HCT: 32 % — ABNORMAL LOW (ref 39.0–52.0)
HCT: 19 % — ABNORMAL LOW (ref 39.0–52.0)
Hemoglobin: 7.8 g/dL — ABNORMAL LOW (ref 13.0–17.0)
Hemoglobin: 10.9 g/dL — ABNORMAL LOW (ref 13.0–17.0)
Hemoglobin: 6.5 g/dL — CL (ref 13.0–17.0)
O2 Saturation: 91 %
O2 Saturation: 89 %
O2 Saturation: 92 %
Patient temperature: 36.7
Patient temperature: 97.8
Patient temperature: 98.1
Potassium: 4.8 mmol/L (ref 3.5–5.1)
Potassium: 4.8 mmol/L (ref 3.5–5.1)
Potassium: 5 mmol/L (ref 3.5–5.1)
Sodium: 137 mmol/L (ref 135–145)
Sodium: 137 mmol/L (ref 135–145)
Sodium: 137 mmol/L (ref 135–145)
TCO2: 25 mmol/L (ref 22–32)
TCO2: 25 mmol/L (ref 22–32)
TCO2: 23 mmol/L (ref 22–32)
pCO2 arterial: 51.5 mmHg — ABNORMAL HIGH (ref 32.0–48.0)
pCO2 arterial: 49 mmHg — ABNORMAL HIGH (ref 32.0–48.0)
pCO2 arterial: 34.9 mmHg (ref 32.0–48.0)
pH, Arterial: 7.264 — ABNORMAL LOW (ref 7.350–7.450)
pH, Arterial: 7.291 — ABNORMAL LOW (ref 7.350–7.450)
pH, Arterial: 7.408 (ref 7.350–7.450)
pO2, Arterial: 70 mmHg — ABNORMAL LOW (ref 83.0–108.0)
pO2, Arterial: 63 mmHg — ABNORMAL LOW (ref 83.0–108.0)
pO2, Arterial: 61 mmHg — ABNORMAL LOW (ref 83.0–108.0)

## 2019-03-28 LAB — RENAL FUNCTION PANEL
Albumin: 1.9 g/dL — ABNORMAL LOW (ref 3.5–5.0)
Albumin: 1.8 g/dL — ABNORMAL LOW (ref 3.5–5.0)
Anion gap: 7 (ref 5–15)
Anion gap: 6 (ref 5–15)
BUN: 26 mg/dL — ABNORMAL HIGH (ref 8–23)
BUN: 26 mg/dL — ABNORMAL HIGH (ref 8–23)
CO2: 25 mmol/L (ref 22–32)
CO2: 25 mmol/L (ref 22–32)
Calcium: 6.8 mg/dL — ABNORMAL LOW (ref 8.9–10.3)
Calcium: 7 mg/dL — ABNORMAL LOW (ref 8.9–10.3)
Chloride: 105 mmol/L (ref 98–111)
Chloride: 105 mmol/L (ref 98–111)
Creatinine, Ser: 1.3 mg/dL — ABNORMAL HIGH (ref 0.61–1.24)
Creatinine, Ser: 1.29 mg/dL — ABNORMAL HIGH (ref 0.61–1.24)
GFR calc Af Amer: >60 mL/min (ref >60)
GFR calc Af Amer: >60 mL/min (ref >60)
GFR calc non Af Amer: 56 mL/min — ABNORMAL LOW (ref >60)
GFR calc non Af Amer: 57 mL/min — ABNORMAL LOW (ref >60)
Glucose, Bld: 147 mg/dL — ABNORMAL HIGH (ref 70–99)
Glucose, Bld: 132 mg/dL — ABNORMAL HIGH (ref 70–99)
Phosphorus: 2.2 mg/dL — ABNORMAL LOW (ref 2.5–4.6)
Phosphorus: 1.8 mg/dL — ABNORMAL LOW (ref 2.5–4.6)
Potassium: 4.7 mmol/L (ref 3.5–5.1)
Potassium: 5 mmol/L (ref 3.5–5.1)
Sodium: 137 mmol/L (ref 135–145)
Sodium: 136 mmol/L (ref 135–145)

## 2019-03-28 LAB — GLUCOSE, CAPILLARY
Glucose-Capillary: 123 mg/dL — ABNORMAL HIGH (ref 70–99)
Glucose-Capillary: 105 mg/dL — ABNORMAL HIGH (ref 70–99)
Glucose-Capillary: 98 mg/dL (ref 70–99)
Glucose-Capillary: 114 mg/dL — ABNORMAL HIGH (ref 70–99)
Glucose-Capillary: 127 mg/dL — ABNORMAL HIGH (ref 70–99)
Glucose-Capillary: 112 mg/dL — ABNORMAL HIGH (ref 70–99)

## 2019-03-28 LAB — CBC WITH DIFFERENTIAL/PLATELET
Abs Immature Granulocytes: 0.07 10*3/uL (ref 0.00–0.07)
Basophils Absolute: 0 10*3/uL (ref 0.0–0.1)
Basophils Relative: 0 %
Eosinophils Absolute: 0.2 10*3/uL (ref 0.0–0.5)
Eosinophils Relative: 2 %
HCT: 23.1 % — ABNORMAL LOW (ref 39.0–52.0)
Hemoglobin: 7.5 g/dL — ABNORMAL LOW (ref 13.0–17.0)
Immature Granulocytes: 1 %
Lymphocytes Relative: 7 %
Lymphs Abs: 0.9 10*3/uL (ref 0.7–4.0)
MCH: 29.9 pg (ref 26.0–34.0)
MCHC: 32.5 g/dL (ref 30.0–36.0)
MCV: 92 fL (ref 80.0–100.0)
Monocytes Absolute: 0.2 10*3/uL (ref 0.1–1.0)
Monocytes Relative: 1 %
Neutro Abs: 11.1 10*3/uL — ABNORMAL HIGH (ref 1.7–7.7)
Neutrophils Relative %: 89 %
Platelets: 83 10*3/uL — ABNORMAL LOW (ref 150–400)
RBC: 2.51 MIL/uL — ABNORMAL LOW (ref 4.22–5.81)
RDW: 16 % — ABNORMAL HIGH (ref 11.5–15.5)
WBC: 12.4 10*3/uL — ABNORMAL HIGH (ref 4.0–10.5)
nRBC: 0.7 % — ABNORMAL HIGH (ref 0.0–0.2)

## 2019-03-28 LAB — MAGNESIUM: Magnesium: 2.4 mg/dL (ref 1.7–2.4)

## 2019-03-28 LAB — FERRITIN: Ferritin: 149 ng/mL (ref 24–336)

## 2019-03-28 LAB — APTT: aPTT: >200 seconds (ref 24–36)

## 2019-03-28 LAB — D-DIMER, QUANTITATIVE: D-Dimer, Quant: 3.79 ug/mL-FEU — ABNORMAL HIGH (ref 0.00–0.50)

## 2019-03-28 LAB — HEMOGLOBIN AND HEMATOCRIT, BLOOD
HCT: 19.8 % — ABNORMAL LOW (ref 39.0–52.0)
Hemoglobin: 6.4 g/dL — CL (ref 13.0–17.0)

## 2019-03-28 LAB — PREPARE RBC (CROSSMATCH)

## 2019-03-28 LAB — C-REACTIVE PROTEIN: CRP: 12.3 mg/dL — ABNORMAL HIGH (ref <1.0)

## 2019-03-28 MED ORDER — PANTOPRAZOLE SODIUM 40 MG IV SOLR
40.0000 mg | INTRAVENOUS | Status: DC
  Administered 2019-03-28: 40 mg via INTRAVENOUS
  Filled 2019-03-28: qty 40

## 2019-03-28 MED ORDER — SODIUM CHLORIDE 0.9% IV SOLUTION
Freq: Once | INTRAVENOUS | Status: AC
  Administered 2019-03-28: 18:00:00 via INTRAVENOUS

## 2019-03-28 NOTE — Progress Notes (Addendum)
OVERNIGHT COVERAGE CRITICAL CARE PROGRESS NOTE  CTSP re: thrombocytopenia.  Plts 151 --> 122 --> 127 --> 149 --> 141 --> 156 --> 122 --> 89 --> 71 --> 76 --> 74 --> 75 --> 83.  Patient is on CRRT and has pre-existing diagnosis of CKD.  On famotidine for DVT prophylaxis.  Heparin exposure via CRRT circuit and DVT prophylaxis.  Of note, Hgb 6.4.  Currently getting transfusion.  Also, nurse reports that patient was experiencing hypotension during dayshift.  However, the nightshift nurse reports that CVC dressing continued to have problems with getting wet (not bloody).  CVC was found to be out.  In fact, when norepinephrine was connected to infusion port on Trialysis catheter, the patient was found to have a significantly decreased vasopressor requirement.  Remove CVC. Doubt HIT. Stop famotidine.  Start Protonix for DVT prophylaxis. Follow up CBC.   Renee Pain, MD Board Certified by the ABIM, [place]

## 2019-03-28 NOTE — Progress Notes (Signed)
Critical PTT>200 received. Dr. Maryland Pink (hospitalist) text-paged to notify. No concerns for bleeding at this time. PTT was also >200 yesterday, was 104 before that.   Pt is on subQ heparin as well as heparin via CRRT syringe d/t prior filter clotting issues.   MD called back, discussed. No orders at this time. Will continue to monitor.  Henreitta Leber, RN 3:39 AM 03/28/19

## 2019-03-28 NOTE — Progress Notes (Signed)
During shift assessment, RIJ CVC dressing found to be almost completely off and wet. At first I thought it was from drool, but while cleaning the site to redress, clear drainage noted to come directly from insertion site. Also, catheter was not in securement devices sutured next to insertion site. Switched the levophed to infuse to purple port of HD catheter instead, and noted significantly decreased pressor needs. Other drips switched to purple port as well and RIJ CVC removed, only about a centimeter or two was inside pt.   Dr. Carson Myrtle notified of these events. Phlebotomy notified to draw followup H&H around 10pm (two hours post blood completion).  Will continue to monitor.   Henreitta Leber, RN

## 2019-03-28 NOTE — Progress Notes (Signed)
NAMEDalton Owen, MRN:  [id number], DOB:  1951/10/08, LOS: 4 ADMISSION DATE:  03/01/2019, CONSULTATION DATE: Mar 21, 2019 REFERRING MD: Dr. Thereasa Solo, CHIEF COMPLAINT: Dyspnea  Brief History   69 year old male admitted on May 19 with COVID-19 pneumonia.  Developed acute kidney injury and worsening hypoxemic respiratory failure requiring intubation and mechanical ventilation on Mar 21, 2019.  Past Medical History  CKD Cigarette smoker Hypertension Diabetes mellitus type 2  Significant Hospital Events     Consults:  PCCM  Procedures:  ET tube 5/23 >>  Right IJ CVC 5/23 >>   Significant Diagnostic Tests:  May 25 CT head no acute intracranial process  Micro Data:  SARS CoV2 5/19 >> positive Urine 5/20 >> insignificant growth Blood 5/19 >> 1 of 2 MRSE (? Contaminant) Blood 5/20 >>  Resp 5/23 >>   Antimicrobials:  Azithro 5/19 x 1 Ceftriaxone 5/19 x 1 Cefepime 5/20 >> off vanco 5/20 >> 5/22  Interim history/subjective:   No events overnight Remains hypoxemic  Objective   Blood pressure (!) 98/54, pulse 72, temperature 97.8 F (36.6 C), temperature source Oral, resp. rate (!) 28, height 5\' 1"  (1.549 m), weight 45.2 kg, SpO2 93 %. CVP:  [4 mmHg] 4 mmHg  Vent Mode: PCV FiO2 (%):  [60 %-70 %] 60 % Set Rate:  [28 bmp] 28 bmp PEEP:  [12 cmH20] 12 cmH20 Plateau Pressure:  [26 cmH20-31 cmH20] 30 cmH20   Intake/Output Summary (Last 24 hours) at 03/28/2019 0819 Last data filed at 03/28/2019 0800 Gross per 24 hour  Intake 2338.07 ml  Output 2599 ml  Net -260.93 ml   Filed Weights   03/26/19 0245 03/27/19 0414 03/28/19 0500  Weight: 50.7 kg 60 kg 45.2 kg   Examination:  General: Acutely ill appearing male, NAD HENT: /AT, PERRL, EOM-I and MMM, ETT in place PULM: Diminished BS diffusely CV: RRR, Nl S1/S2 and -M/R/G GI: Soft, NT, ND and +BS MSK: normal bulk and tone Neuro: Sedated, on vent  I reviewed CXR myself, ETT is in a good position, infiltrate noted   Resolved Hospital Problem list     Assessment & Plan:  Acute respiratory failure with hypoxemia due to COVID-19: Chest x-ray findings and oxygenation worsening on May 27 which I think is due to acute pulmonary edema Decrease PEEP to 10, FiO2 to 60 and increase RR to 32 Titrate O2 for sat of 88-92% Full vent support CRRT to even D-dimer 2.51 SQ heparin CRP 10.5 already got actemra, not a candid for remdesivir due to renal failure, received 3 day of steroids, nothing further to add in regards to COVID  Acute kidney injury: give oliguria and worsening CXR findings, I suspect he has acute pulm edema CRRT to even given hemodynamics Monitor urine output BMET in AM Replace electrolytes as indicated  Autonomic instability/hypotension: Shock resolved/chronic hypotension again worse 5/27 but no clear sign of sepsis Resume norepinehrpine titrated to mean arterial pressure greater than 65, up to 13 Midodrine to 10 mg PO TID Attempt to minimize sedation  Need for mechanical ventilation, sedation for ventilator synchrony: Intermittent periods of unresponsiveness: Felt to be related to narcotics, head CT unremarkable Continue dilaudid infusion PRN versed 1-2 mg IV q2 hours Attempt to get off ketamine  Best practice:  Diet: Tube feeding Pain/Anxiety/Delirium protocol (if indicated): yes, RASS-1 to -2 VAP protocol (if indicated): yes DVT prophylaxis: sub q heparin GI prophylaxis: famotidine Glucose control: SSI Mobility: bedrest Code Status: full Family Communication: per [facility] Disposition: remain in ICU  Labs   CBC: Recent Labs  Lab 03/24/19 0225 03/25/19 0230  03/26/19 0300  03/26/19 2220 03/27/19 0140 03/27/19 0349 03/28/19 0045 03/28/19 0420  WBC 13.4* 15.5*  --  21.0*  --   --  13.4*  --  12.4*  --   NEUTROABS 12.5* 14.7*  --  19.6*  --   --  12.3*  --  11.1*  --   HGB 8.8* 8.8*   < > 7.8*   < > 8.5* 8.8* 7.8* 7.5* 7.8*  HCT 28.4* 29.5*   < > 25.4*   < > 25.0* 27.8* 23.0*  23.1* 23.0*  MCV 94.4 95.8  --  93.7  --   --  92.7  --  92.0  --   PLT 71* 76*  --  74*  --   --  75*  --  83*  --    < > = values in this interval not displayed.    Basic Metabolic Panel: Recent Labs  Lab 03/24/19 0225  03/25/19 0230  03/26/19 0300  03/26/19 1556  03/27/19 0140 03/27/19 0145 03/27/19 0349 03/27/19 1530 03/28/19 0045 03/28/19 0420  NA 144   < > 146*   < > 146*   < > 142   < >  --  141 140 138 137 137  K 4.4   < > 4.9   < > 4.6   < > 4.4   < >  --  4.9 4.5 4.4 4.7 4.8  CL 121*   < > 124*  --  124*  --  113*  --   --  109  --  108 105  --   CO2 19*   < > 18*  --  15*  --  23  --   --  24  --  25 25  --   GLUCOSE 195*   < > 286*  --  137*  --  177*  --   --  139*  --  80 147*  --   BUN 98*   < > 83*  --  112*  --  64*  --   --  46*  --  33* 26*  --   CREATININE 4.31*   < > 3.94*  --  4.80*  --  2.84*  --   --  2.12*  --  1.47* 1.30*  --   CALCIUM 8.0*   < > 7.7*  --  7.5*  --  7.1*  --   --  7.1*  --  6.9* 6.8*  --   MG 2.6*  --  2.5*  --  2.6*  --   --   --  2.5*  --   --   --  2.4  --   PHOS 4.5  --   --   --  3.5  --  3.0  --   --  2.5  --  2.3* 2.2*  --    < > = values in this interval not displayed.   GFR: Estimated Creatinine Clearance: 34.8 mL/min (A) (by C-G formula based on SCr of 1.3 mg/dL (H)). Recent Labs  Lab 03/25/19 0230 03/26/19 0300 03/27/19 0140 03/28/19 0045  WBC 15.5* 21.0* 13.4* 12.4*   Liver Function Tests: Recent Labs  Lab 03/24/19 0225 03/25/19 0230 03/26/19 0300 03/26/19 1556 03/27/19 0140 03/27/19 0145 03/27/19 1530 03/28/19 0045  AST 30 33 52*  --  55*  --   --  49*  ALT 20  21 22  --  26  --   --  24  ALKPHOS 89 108 153*  --  186*  --   --  159*  BILITOT 0.2* 0.4 0.3  --  0.4  --   --  0.1*  PROT 5.4* 5.4* 5.3*  --  6.1*  --   --  5.5*  ALBUMIN 1.9* 1.9* 1.8*  1.8* 1.9* 2.3* 2.1* 2.0* 1.9*  1.9*   No results for input(s): LIPASE, AMYLASE in the last 168 hours. Recent Labs  Lab 03/24/19 0445  AMMONIA 39*   ABG     Component Value Date/Time   PHART 7.264 (L) 03/28/2019 0420   PCO2ART 51.5 (H) 03/28/2019 0420   PO2ART 70.0 (L) 03/28/2019 0420   HCO3 23.4 03/28/2019 0420   TCO2 25 03/28/2019 0420   ACIDBASEDEF 4.0 (H) 03/28/2019 0420   O2SAT 91.0 03/28/2019 0420    Coagulation Profile: No results for input(s): INR, PROTIME in the last 168 hours.  Cardiac Enzymes: Recent Labs  Lab 03/22/19 0342  TROPONINI <0.03   HbA1C: Hgb A1c MFr Bld  Date/Time Value Ref Range Status  03/18/2019 10:45 AM 14.9 (H) 4.8 - 5.6 % Final    Comment:    (NOTE) Pre diabetes:          5.7%-6.4% Diabetes:              >6.4% Glycemic control for   <7.0% adults with diabetes   03/18/2019 01:18 AM 14.9 (H) 4.8 - 5.6 % Final    Comment:    (NOTE) Pre diabetes:          5.7%-6.4% Diabetes:              >6.4% Glycemic control for   <7.0% adults with diabetes    CBG: Recent Labs  Lab 03/27/19 1555 03/27/19 2103 03/27/19 2325 03/28/19 0413 03/28/19 0754  GLUCAP 124* 142* 139* 123* 105*   The patient is critically ill with multiple organ systems failure and requires high complexity decision making for assessment and support, frequent evaluation and titration of therapies, application of advanced monitoring technologies and extensive interpretation of multiple databases.   Critical Care Time devoted to patient care services described in this note is  32  Minutes. This time reflects time of care of this signee Dr Jennet Maduro. This critical care time does not reflect procedure time, or teaching time or supervisory time of PA/NP/Med student/Med Resident etc but could involve care discussion time.  Rush Farmer, M.D. [facility] Pulmonary/Critical Care Medicine. Pager: [phone]. After hours pager: [phone].

## 2019-03-28 NOTE — Progress Notes (Signed)
CRITICAL VALUE ALERT  Critical Value: Hemoglobin  Date & Time Notied:  03/28/19 at 1417  Provider Notified: Yacoub  Orders Received/Actions taken: ordered 1 unit of blood to be given. Placed orders.

## 2019-03-28 NOTE — Progress Notes (Signed)
[facility] Progress Note  Subjective: I/O -500 yest, back on levo gtt. FiO2 60%  Vitals:   03/28/19 1630 03/28/19 1700 03/28/19 1745 03/28/19 1800  BP: (!) 93/49 (!) 104/51 (!) 78/39 (!) 87/43  Pulse: 77 76 73 73  Resp: (!) 31 (!) 35 (!) 35 (!) 35  Temp:   97.8 F (36.6 C) 97.8 F (36.6 C)  TempSrc:   Oral Oral  SpO2: 96% 99% 100% 100%  Weight:      Height:        Inpatient medications: . B-complex with vitamin C  1 tablet Per Tube Daily  . chlorhexidine  15 mL Mouth/Throat BID  . Chlorhexidine Gluconate Cloth  6 each Topical Q0600  . famotidine  20 mg Per Tube Daily  . heparin injection (subcutaneous)  5,000 Units Subcutaneous Q8H  . insulin aspart  0-15 Units Subcutaneous Q4H  . insulin detemir  15 Units Subcutaneous BID  . mouth rinse  15 mL Mouth Rinse 10 times per day  . midodrine  10 mg Per Tube Q8H  . pneumococcal 23 valent vaccine  0.5 mL Intramuscular Tomorrow-1000   .  prismasol BGK 4/2.5 400 mL/hr at 03/27/19 2353  .  prismasol BGK 4/2.5 200 mL/hr at 03/28/19 1303  . sodium chloride 10 mL/hr at 03/28/19 1800  . feeding supplement (VITAL AF 1.2 CAL) 1,000 mL (03/28/19 0926)  . heparin 10,000 units/ 20 mL infusion syringe 1,000 Units/hr (03/28/19 1315)  . HYDROmorphone 0.4 mg/hr (03/28/19 1800)  . ketamine infusion 500 mg in sodium chloride 0.9% 100 mL Stopped (03/28/19 1230)  . norepinephrine (LEVOPHED) Adult infusion 18 mcg/min (03/28/19 1800)  . prismasol BGK 4/2.5 1,750 mL/hr at 03/28/19 1630   sodium chloride, acetaminophen (TYLENOL) oral liquid 160 mg/5 mL, acetaminophen, heparin, heparin, HYDROmorphone, lip balm, midazolam    date                Creat               eGFR  2009- 2016     0.6- 1.0  2017- 2019     1.2- 1.06 Nov 2018        2.0- 2.7            27- 34  Feb 2020        2.0- 2.3            33- 38  03/15/2019 3.36  admission   Exam: Patient not examined directly given COVID-19 + status, utilizing exam of the primary  team and observations of RN's.    Home meds:  - insulin glargine 9u qd   CXR 5/19 > Worsening hazy multifocal airspace opacities concerning for progression of viral pneumonia  CXR 5/27 > Increased interstitial and airspace disease compatible with infection and ARDS   UA 5/15 > 30 prot, 0-5 rbc/ wbc  UA 5/20 > 100 prot, 0-5 rbc/ wbc  Renal US 5/26 - Right nephrolithiasis.  Benign-appearing cyst in the left kidney measures 2.2 cm.  No evidence of hydronephrosis. Foley catheter decompresses the bladder.   Assessment: 1 AKI on CKD 3/4 -baseline creat 2.0.  CRRT started 5/27 via L IJ cath. UOP has dropped off.  - cont CRRTd#3 - I/O yest -500 cc, keeping even today, wt's inaccurate - cont IV heparin through CRRT circuit at  ^1000u/hr standing dose - FiO2 stable 60% - on levo gtt  2  COVID + viral PNA - per CCM  3  IDDM  4  Tobacco smoker  5  Met acidosis - from A/ CRF/ resp infection. Stable.     [facility] 03/28/2019, 6:19 PM  Iron/TIBC/Ferritin/ %Sat    Component Value Date/Time   IRON 59 05/15/2014 0538   TIBC 163 (L) 05/15/2014 0538   FERRITIN 149 03/28/2019 0045   IRONPCTSAT 36 05/15/2014 0538   Recent Labs  Lab 03/28/19 1535  NA 136  K 5.0  CL 105  CO2 25  GLUCOSE 132*  BUN 26*  CREATININE 1.29*  CALCIUM 7.0*  PHOS 1.8*  ALBUMIN 1.8*   Recent Labs  Lab 03/28/19 0045  AST 49*  ALT 24  ALKPHOS 159*  BILITOT 0.1*  PROT 5.5*   Recent Labs  Lab 03/28/19 0045  03/28/19 1255  WBC 12.4*  --   --   HGB 7.5*   < > 6.4*  HCT 23.1*   < > 19.8*  PLT 83*  --   --    < > = values in this interval not displayed.

## 2019-03-28 NOTE — Progress Notes (Signed)
PROGRESS NOTE  Brian Owen SEG:[id number] DOB: Dec 30, 1950 DOA: 03/04/2019 PCP: Alfonse Spruce, FNP   LOS: 10 days   Brief Narrative / Interim history: 68 year old male with history of hypertension, diabetes, chronic kidney disease stage III, multiple prior admissions for orthostatic hypotension related to autonomic dysfunction-noncompliant with Midodrine, who was admitted to the hospital on 03/16/2019 with fever, worsening renal failure, as well as multifocal pneumonia on chest x-ray.  He was found to be Covid positive  Significant Events: 5/19 admit  5/22 saturations declining 5/23 transferred to ICU - intubated - R IJ CVL  COVID-19 specific Treatment: Actemra 5/21 & 5/23 Steroids 5/21 > 5/23  Subjective: Remains intubated on vent.  Hypotensive last night  Assessment & Plan: Principal Problem:   Acute respiratory disease due to COVID-19 virus Active Problems:   Type 2 diabetes mellitus with hyperglycemia, with long-term current use of insulin (HCC)   AKI (acute kidney injury) ([place])   Severe sepsis (HCC)   Acute respiratory failure with hypoxia (HCC)   Principal Problem Acute Hypoxic Respiratory Failure due to Covid-19 Viral Illness -Remains on vent support, PCCM following  Vent Mode: PCV FiO2 (%):  [60 %-70 %] 60 % Set Rate:  [28 bmp-32 bmp] 32 bmp PEEP:  [10 cmH20-12 cmH20] 10 cmH20 Plateau Pressure:  [26 cmH20-31 cmH20] 30 cmH20  -Not a candidate for Remdesivir due to renal failure -Has received Actemra x2 -Has received Solu-Medrol x3 days up until 5/23  COVID-19 Labs  Recent Labs    03/26/19 0300 03/27/19 0140 03/27/19 0145 03/28/19 0045  DDIMER 3.96* 2.51*  --  3.79*  FERRITIN 247  --  253 149  CRP 9.8*  --  10.5* 12.3*    Lab Results  Component Value Date   SARSCOV2NAA POSITIVE (A) 02/27/2019     Active Problems:  Autonomic dysfunction /chronic orthostatic hypotension -Cont midodrine per tube, on Levophed  Acute kidney injury on chronic  kidney disease stage III -Baseline creatinine around 2.0, urine output was minimal, did not respond to Lasix and patient was started on CRRT 5/28, nephrology consulted.  Type 2 diabetes mellitus  -currently on 15 units of Levemir along with sliding scale, CBGs well-controlled  Mildly elevated troponin -Due to demand  Positive blood culture - Coag neg Staph + Diptheroids (1 of 2 blood cx) -Consistent with contamination  Scheduled Meds: . B-complex with vitamin C  1 tablet Per Tube Daily  . chlorhexidine  15 mL Mouth/Throat BID  . Chlorhexidine Gluconate Cloth  6 each Topical Q0600  . famotidine  20 mg Per Tube Daily  . heparin injection (subcutaneous)  5,000 Units Subcutaneous Q8H  . insulin aspart  0-15 Units Subcutaneous Q4H  . insulin detemir  15 Units Subcutaneous BID  . mouth rinse  15 mL Mouth Rinse 10 times per day  . midodrine  10 mg Per Tube Q8H  . pneumococcal 23 valent vaccine  0.5 mL Intramuscular Tomorrow-1000   Continuous Infusions: .  prismasol BGK 4/2.5 400 mL/hr at 03/27/19 2353  .  prismasol BGK 4/2.5 200 mL/hr at 03/27/19 1051  . sodium chloride 10 mL/hr at 03/28/19 1000  . feeding supplement (VITAL AF 1.2 CAL) 1,000 mL (03/28/19 0926)  . heparin 10,000 units/ 20 mL infusion syringe 1,000 Units/hr (03/28/19 1000)  . HYDROmorphone 1.5 mg/hr (03/28/19 1000)  . ketamine infusion 500 mg in sodium chloride 0.9% 100 mL 1.3 mg/kg/hr (03/28/19 1000)  . norepinephrine (LEVOPHED) Adult infusion 13 mcg/min (03/28/19 1000)  . prismasol BGK 4/2.5 1,750 mL/hr  at 03/28/19 1029   PRN Meds:.sodium chloride, acetaminophen (TYLENOL) oral liquid 160 mg/5 mL, acetaminophen, heparin, heparin, HYDROmorphone, lip balm, midazolam  DVT prophylaxis: heparin Code Status: Full code Family Communication: none  Disposition Plan: TBD  Consultants:   PCCM   Procedures:   None   Antimicrobials: Azithro 5/19 x 1 Ceftriaxone 5/19 x 1 Cefepime 5/20 >>off vanco 5/20 >> 5/22    Objective: Vitals:   03/28/19 0900 03/28/19 0930 03/28/19 1000 03/28/19 1030  BP: (!) 109/56 (!) 117/52 (!) 113/52 (!) 116/53  Pulse: 74 73 76 75  Resp: (!) 25 (!) 32 (!) 32 (!) 23  Temp:      TempSrc:      SpO2: 98% 98% 94% 96%  Weight:      Height:        Intake/Output Summary (Last 24 hours) at 03/28/2019 1034 Last data filed at 03/28/2019 1000 Gross per 24 hour  Intake 2326.38 ml  Output 2783 ml  Net -456.62 ml   Filed Weights   03/26/19 0245 03/27/19 0414 03/28/19 0500  Weight: 50.7 kg 60 kg 45.2 kg    Examination:  Constitutional: Intubated, sedated Eyes: No scleral icterus ENMT: Dry mucous membranes Neck: normal, supple Respiratory: Coarse bilaterally, no wheezing, no crackles Cardiovascular: Regular rate and rhythm, no murmur, no edema Abdomen: Soft, bowel sounds positive Musculoskeletal: no clubbing / cyanosis.  Skin: No new rashes  Neurologic: Sedated on vent   Data Reviewed: I have independently reviewed following labs and imaging studies   CBC: Recent Labs  Lab 03/24/19 0225 03/25/19 0230  03/26/19 0300  03/27/19 0140 03/27/19 0349 03/28/19 0045 03/28/19 0420 03/28/19 1020  WBC 13.4* 15.5*  --  21.0*  --  13.4*  --  12.4*  --   --   NEUTROABS 12.5* 14.7*  --  19.6*  --  12.3*  --  11.1*  --   --   HGB 8.8* 8.8*   < > 7.8*   < > 8.8* 7.8* 7.5* 7.8* 10.9*  HCT 28.4* 29.5*   < > 25.4*   < > 27.8* 23.0* 23.1* 23.0* 32.0*  MCV 94.4 95.8  --  93.7  --  92.7  --  92.0  --   --   PLT 71* 76*  --  74*  --  75*  --  83*  --   --    < > = values in this interval not displayed.   Basic Metabolic Panel: Recent Labs  Lab 03/24/19 0225  03/25/19 0230  03/26/19 0300  03/26/19 1556  03/27/19 0140 03/27/19 0145 03/27/19 0349 03/27/19 1530 03/28/19 0045 03/28/19 0420 03/28/19 1020  NA 144   < > 146*   < > 146*   < > 142   < >  --  141 140 138 137 137 137  K 4.4   < > 4.9   < > 4.6   < > 4.4   < >  --  4.9 4.5 4.4 4.7 4.8 4.8  CL 121*   < > 124*  --   124*  --  113*  --   --  109  --  108 105  --   --   CO2 19*   < > 18*  --  15*  --  23  --   --  24  --  25 25  --   --   GLUCOSE 195*   < > 286*  --  137*  --  177*  --   --  139*  --  80 147*  --   --   BUN 98*   < > 83*  --  112*  --  64*  --   --  46*  --  33* 26*  --   --   CREATININE 4.31*   < > 3.94*  --  4.80*  --  2.84*  --   --  2.12*  --  1.47* 1.30*  --   --   CALCIUM 8.0*   < > 7.7*  --  7.5*  --  7.1*  --   --  7.1*  --  6.9* 6.8*  --   --   MG 2.6*  --  2.5*  --  2.6*  --   --   --  2.5*  --   --   --  2.4  --   --   PHOS 4.5  --   --   --  3.5  --  3.0  --   --  2.5  --  2.3* 2.2*  --   --    < > = values in this interval not displayed.   GFR: Estimated Creatinine Clearance: 34.8 mL/min (A) (by C-G formula based on SCr of 1.3 mg/dL (H)). Liver Function Tests: Recent Labs  Lab 03/24/19 0225 03/25/19 0230 03/26/19 0300 03/26/19 1556 03/27/19 0140 03/27/19 0145 03/27/19 1530 03/28/19 0045  AST 30 33 52*  --  55*  --   --  49*  ALT 20 21 22   --  26  --   --  24  ALKPHOS 89 108 153*  --  186*  --   --  159*  BILITOT 0.2* 0.4 0.3  --  0.4  --   --  0.1*  PROT 5.4* 5.4* 5.3*  --  6.1*  --   --  5.5*  ALBUMIN 1.9* 1.9* 1.8*  1.8* 1.9* 2.3* 2.1* 2.0* 1.9*  1.9*   No results for input(s): LIPASE, AMYLASE in the last 168 hours. Recent Labs  Lab 03/24/19 0445  AMMONIA 39*   Coagulation Profile: No results for input(s): INR, PROTIME in the last 168 hours. Cardiac Enzymes: Recent Labs  Lab 03/22/19 0342  TROPONINI <0.03   BNP (last 3 results) No results for input(s): PROBNP in the last 8760 hours. HbA1C: No results for input(s): HGBA1C in the last 72 hours. CBG: Recent Labs  Lab 03/27/19 1555 03/27/19 2103 03/27/19 2325 03/28/19 0413 03/28/19 0754  GLUCAP 124* 142* 139* 123* 105*   Lipid Profile: Recent Labs    03/26/19 2205  TRIG 277*   Thyroid Function Tests: No results for input(s): TSH, T4TOTAL, FREET4, T3FREE, THYROIDAB in the last 72 hours.  Anemia Panel: Recent Labs    03/27/19 0145 03/28/19 0045  FERRITIN 253 149   Urine analysis:    Component Value Date/Time   COLORURINE YELLOW 03/18/2019 0048   APPEARANCEUR CLEAR 03/18/2019 0048   LABSPEC 1.013 03/18/2019 0048   PHURINE 5.0 03/18/2019 0048   GLUCOSEU >=500 (A) 03/18/2019 0048   HGBUR LARGE (A) 03/18/2019 0048   BILIRUBINUR NEGATIVE 03/18/2019 0048   BILIRUBINUR neg 09/05/2017 1111   KETONESUR 5 (A) 03/18/2019 0048   PROTEINUR 100 (A) 03/18/2019 0048   UROBILINOGEN 0.2 09/05/2017 1111   UROBILINOGEN 0.2 07/22/2015 1941   NITRITE NEGATIVE 03/18/2019 0048   LEUKOCYTESUR NEGATIVE 03/18/2019 0048   Sepsis Labs: Invalid input(s): PROCALCITONIN, LACTICIDVEN  Recent Results (from the past 240 hour(s))  Culture, blood (routine x 2) Call  MD if unable to obtain prior to antibiotics being given     Status: None   Collection Time: 03/18/19  4:00 PM  Result Value Ref Range Status   Specimen Description   Final    BLOOD RIGHT ARM Performed at [facility] [address] [place], [place] [address]    Special Requests   Final    BOTTLES DRAWN AEROBIC ONLY Blood Culture results may not be optimal due to an inadequate volume of blood received in culture bottles   Culture   Final    NO GROWTH 5 DAYS Performed at [facility], [place] [address] [place], [place] [address]    Report Status 03/23/2019 FINAL  Final  Culture, blood (routine x 2) Call MD if unable to obtain prior to antibiotics being given     Status: None   Collection Time: 03/18/19  4:00 PM  Result Value Ref Range Status   Specimen Description   Final    BLOOD RIGHT HAND Performed at [facility] [address] [place], [place] [address]    Special Requests   Final    BOTTLES DRAWN AEROBIC ONLY Blood Culture results may not be optimal due to an inadequate volume of blood received in culture bottles   Culture   Final    NO GROWTH 5 DAYS Performed at [facility], [place] [address], [place], [place] [address]    Report Status 03/23/2019 FINAL  Final  MRSA PCR Screening     Status: None   Collection Time: 03/19/19  2:00 PM  Result Value Ref Range Status   MRSA by PCR NEGATIVE NEGATIVE Final    Comment:        The GeneXpert MRSA Assay (FDA approved for NASAL specimens only), is one component of a comprehensive MRSA colonization surveillance program. It is not intended to diagnose MRSA infection nor to guide or monitor treatment for MRSA infections. Performed at [facility], [place] [address] [place], [place] [address]   Culture, respiratory (non-expectorated)     Status: None   Collection Time: 03/21/19  2:42 PM  Result Value Ref Range Status   Specimen Description   Final    TRACHEAL ASPIRATE Performed at [facility] [address] [place], [place] [address]    Special Requests   Final    NONE Performed at [facility], [place] [address] [place], [place] [address]    Gram Stain   Final    RARE WBC PRESENT,BOTH PMN AND MONONUCLEAR RARE YEAST Performed at [facility], [place] [address] [place], [place] [address]    Culture FEW CANDIDA ALBICANS  Final   Report Status 03/24/2019 FINAL  Final      Radiology Studies: Dg Chest Port 1 View  Result Date: 03/27/2019 CLINICAL DATA:  Intubation. EXAM: PORTABLE CHEST 1 VIEW COMPARISON:  03/26/2019. FINDINGS: Tracheostomy tube, NG tube, right IJ line, left IJ line in stable position. Heart size normal. Diffuse bilateral severe pulmonary infiltrates are again noted and unchanged. Small right pleural effusion cannot be excluded. IMPRESSION: 1.  Lines and tubes in stable position. 2. Diffuse bilateral severe pulmonary infiltrates are again noted and unchanged small right pleural effusion cannot be excluded. Electronically Signed   By: Marcello Moores  Register   On: 03/27/2019 07:42    Marzetta Board, MD, PhD [facility]   Contact via  [email or web address]  [place] P: [phone]  F: [phone]

## 2019-03-28 NOTE — Progress Notes (Signed)
RN spoke with daughter. RN provided update and plan for the day. Daughter was thankful. Daughter has no further questions at this time.

## 2019-03-29 ENCOUNTER — Inpatient Hospital Stay: Payer: Self-pay

## 2019-03-29 DIAGNOSIS — L8990 Pressure ulcer of unspecified site, unspecified stage: Secondary | ICD-10-CM

## 2019-03-29 LAB — RENAL FUNCTION PANEL
Albumin: 1.9 g/dL — ABNORMAL LOW (ref 3.5–5.0)
Albumin: 2.1 g/dL — ABNORMAL LOW (ref 3.5–5.0)
Anion gap: 7 (ref 5–15)
Anion gap: 6 (ref 5–15)
BUN: 29 mg/dL — ABNORMAL HIGH (ref 8–23)
BUN: 27 mg/dL — ABNORMAL HIGH (ref 8–23)
CO2: 25 mmol/L (ref 22–32)
CO2: 25 mmol/L (ref 22–32)
Calcium: 6.9 mg/dL — ABNORMAL LOW (ref 8.9–10.3)
Calcium: 7.1 mg/dL — ABNORMAL LOW (ref 8.9–10.3)
Chloride: 105 mmol/L (ref 98–111)
Chloride: 104 mmol/L (ref 98–111)
Creatinine, Ser: 1.42 mg/dL — ABNORMAL HIGH (ref 0.61–1.24)
Creatinine, Ser: 1.14 mg/dL (ref 0.61–1.24)
GFR calc Af Amer: 58 mL/min — ABNORMAL LOW (ref >60)
GFR calc Af Amer: >60 mL/min (ref >60)
GFR calc non Af Amer: 50 mL/min — ABNORMAL LOW (ref >60)
GFR calc non Af Amer: >60 mL/min (ref >60)
Glucose, Bld: 163 mg/dL — ABNORMAL HIGH (ref 70–99)
Glucose, Bld: 130 mg/dL — ABNORMAL HIGH (ref 70–99)
Phosphorus: 2.7 mg/dL (ref 2.5–4.6)
Phosphorus: 2.5 mg/dL (ref 2.5–4.6)
Potassium: 5 mmol/L (ref 3.5–5.1)
Potassium: 5 mmol/L (ref 3.5–5.1)
Sodium: 137 mmol/L (ref 135–145)
Sodium: 135 mmol/L (ref 135–145)

## 2019-03-29 LAB — BASIC METABOLIC PANEL
Anion gap: 7 (ref 5–15)
BUN: 30 mg/dL — ABNORMAL HIGH (ref 8–23)
CO2: 25 mmol/L (ref 22–32)
Calcium: 7 mg/dL — ABNORMAL LOW (ref 8.9–10.3)
Chloride: 105 mmol/L (ref 98–111)
Creatinine, Ser: 1.43 mg/dL — ABNORMAL HIGH (ref 0.61–1.24)
GFR calc Af Amer: 58 mL/min — ABNORMAL LOW (ref >60)
GFR calc non Af Amer: 50 mL/min — ABNORMAL LOW (ref >60)
Glucose, Bld: 161 mg/dL — ABNORMAL HIGH (ref 70–99)
Potassium: 5 mmol/L (ref 3.5–5.1)
Sodium: 137 mmol/L (ref 135–145)

## 2019-03-29 LAB — CBC WITH DIFFERENTIAL/PLATELET
Abs Immature Granulocytes: 0.14 10*3/uL — ABNORMAL HIGH (ref 0.00–0.07)
Basophils Absolute: 0 10*3/uL (ref 0.0–0.1)
Basophils Relative: 0 %
Eosinophils Absolute: 0.1 10*3/uL (ref 0.0–0.5)
Eosinophils Relative: 1 %
HCT: 23.4 % — ABNORMAL LOW (ref 39.0–52.0)
Hemoglobin: 7.5 g/dL — ABNORMAL LOW (ref 13.0–17.0)
Immature Granulocytes: 1 %
Lymphocytes Relative: 8 %
Lymphs Abs: 0.9 10*3/uL (ref 0.7–4.0)
MCH: 29.4 pg (ref 26.0–34.0)
MCHC: 32.1 g/dL (ref 30.0–36.0)
MCV: 91.8 fL (ref 80.0–100.0)
Monocytes Absolute: 0.3 10*3/uL (ref 0.1–1.0)
Monocytes Relative: 2 %
Neutro Abs: 10.3 10*3/uL — ABNORMAL HIGH (ref 1.7–7.7)
Neutrophils Relative %: 88 %
Platelets: 86 10*3/uL — ABNORMAL LOW (ref 150–400)
RBC: 2.55 MIL/uL — ABNORMAL LOW (ref 4.22–5.81)
RDW: 15.1 % (ref 11.5–15.5)
WBC: 11.7 10*3/uL — ABNORMAL HIGH (ref 4.0–10.5)
nRBC: 1.8 % — ABNORMAL HIGH (ref 0.0–0.2)

## 2019-03-29 LAB — GLUCOSE, CAPILLARY
Glucose-Capillary: 117 mg/dL — ABNORMAL HIGH (ref 70–99)
Glucose-Capillary: 152 mg/dL — ABNORMAL HIGH (ref 70–99)
Glucose-Capillary: 106 mg/dL — ABNORMAL HIGH (ref 70–99)
Glucose-Capillary: 113 mg/dL — ABNORMAL HIGH (ref 70–99)
Glucose-Capillary: 115 mg/dL — ABNORMAL HIGH (ref 70–99)

## 2019-03-29 LAB — D-DIMER, QUANTITATIVE: D-Dimer, Quant: 4.99 ug/mL-FEU — ABNORMAL HIGH (ref 0.00–0.50)

## 2019-03-29 LAB — POCT I-STAT 7, (LYTES, BLD GAS, ICA,H+H)
Bicarbonate: 24.8 mmol/L (ref 20.0–28.0)
Calcium, Ion: 1.11 mmol/L — ABNORMAL LOW (ref 1.15–1.40)
HCT: 18 % — ABNORMAL LOW (ref 39.0–52.0)
Hemoglobin: 6.1 g/dL — CL (ref 13.0–17.0)
O2 Saturation: 95 %
Patient temperature: 36.8
Potassium: 4.8 mmol/L (ref 3.5–5.1)
Sodium: 135 mmol/L (ref 135–145)
TCO2: 26 mmol/L (ref 22–32)
pCO2 arterial: 38.7 mmHg (ref 32.0–48.0)
pH, Arterial: 7.413 (ref 7.350–7.450)
pO2, Arterial: 72 mmHg — ABNORMAL LOW (ref 83.0–108.0)

## 2019-03-29 LAB — LACTATE DEHYDROGENASE: LDH: 420 U/L — ABNORMAL HIGH (ref 98–192)

## 2019-03-29 LAB — PHOSPHORUS: Phosphorus: 2.7 mg/dL (ref 2.5–4.6)

## 2019-03-29 LAB — HEPATIC FUNCTION PANEL
ALT: 29 U/L (ref 0–44)
AST: 59 U/L — ABNORMAL HIGH (ref 15–41)
Albumin: 2 g/dL — ABNORMAL LOW (ref 3.5–5.0)
Alkaline Phosphatase: 200 U/L — ABNORMAL HIGH (ref 38–126)
Bilirubin, Direct: 0.1 mg/dL (ref 0.0–0.2)
Indirect Bilirubin: 0.2 mg/dL — ABNORMAL LOW (ref 0.3–0.9)
Total Bilirubin: 0.3 mg/dL (ref 0.3–1.2)
Total Protein: 5.6 g/dL — ABNORMAL LOW (ref 6.5–8.1)

## 2019-03-29 LAB — FERRITIN: Ferritin: 122 ng/mL (ref 24–336)

## 2019-03-29 LAB — PREPARE RBC (CROSSMATCH)

## 2019-03-29 LAB — APTT: aPTT: >200 seconds (ref 24–36)

## 2019-03-29 LAB — MAGNESIUM: Magnesium: 2.6 mg/dL — ABNORMAL HIGH (ref 1.7–2.4)

## 2019-03-29 LAB — C-REACTIVE PROTEIN: CRP: 14.4 mg/dL — ABNORMAL HIGH (ref <1.0)

## 2019-03-29 LAB — ABO/RH: ABO/RH(D): B POS

## 2019-03-29 MED ORDER — CHLORHEXIDINE GLUCONATE CLOTH 2 % EX PADS
6.0000 | MEDICATED_PAD | Freq: Every day | CUTANEOUS | Status: DC
  Administered 2019-03-29 – 2019-03-30 (×2): 6 via TOPICAL

## 2019-03-29 MED ORDER — SODIUM CHLORIDE 0.9% FLUSH
10.0000 mL | Freq: Two times a day (BID) | INTRAVENOUS | Status: DC
  Administered 2019-03-29 – 2019-03-31 (×3): 10 mL

## 2019-03-29 MED ORDER — PANTOPRAZOLE SODIUM 40 MG IV SOLR
40.0000 mg | Freq: Two times a day (BID) | INTRAVENOUS | Status: DC
  Administered 2019-03-29 – 2019-03-31 (×4): 40 mg via INTRAVENOUS
  Filled 2019-03-29 (×4): qty 40

## 2019-03-29 MED ORDER — SODIUM CHLORIDE 0.9% FLUSH: 10.0000 mL | INTRAVENOUS | Status: DC | PRN

## 2019-03-29 MED ORDER — SODIUM CHLORIDE 0.9% IV SOLUTION
Freq: Once | INTRAVENOUS | Status: AC
  Administered 2019-03-29: 13:00:00 via INTRAVENOUS

## 2019-03-29 MED ORDER — "THROMBI-PAD 3""X3"" EX PADS"
1.0000 | MEDICATED_PAD | Freq: Once | CUTANEOUS | Status: DC
  Filled 2019-03-29: qty 1

## 2019-03-29 NOTE — Progress Notes (Signed)
Text paged Dr. Sherral Hammers (hospitalist) regarding pt's agitation, pulling at lines and tubes despite current sedation and PRNs. Pt is not following commands, even when using [place] interpreter via speaker phone. MD called, we discussed situation. Gave verbal order for bilateral soft wrist restraints. Also ordered thrombipad, as new PICC site is very oozy still.

## 2019-03-29 NOTE — Progress Notes (Signed)
Received order for PICC.  Text Dr. Jonnie Finner  Nephrology who OKs PICC placement.   Plan to place today.

## 2019-03-29 NOTE — Progress Notes (Signed)
NAMERayven Owen, MRN:  [id number], DOB:  02-26-1951, LOS: 10 ADMISSION DATE:  03/26/2019, CONSULTATION DATE: Mar 21, 2019 REFERRING MD: Dr. Thereasa Solo, CHIEF COMPLAINT: Dyspnea  Brief History   68 year old male admitted on May 19 with COVID-19 pneumonia.  Developed acute kidney injury and worsening hypoxemic respiratory failure requiring intubation and mechanical ventilation on Mar 21, 2019.   Past Medical History  Chronic kidney disease Cigarette smoker Hypertension Diabetes mellitus type 2  Significant Hospital Events   May 27 initiation of CVVHD  Consults:  Pulmonary and critical care medicine Nephrology  Procedures:  ET tube May 23>  Right IJ central line May 23> May 30 Left IJ hemodialysis catheter May 27>   Significant Diagnostic Tests:  May 25 CT head no acute intracranial process  Micro Data:  SARS CoV2 5/19 >> positive Urine 5/20 >> insignificant growth Blood 5/19 >> 1 of 2 MRSE (? Contaminant) Blood 5/20 >> Resp 5/23 >>Candida albicans  Antimicrobials:  Azithro 5/19 x 1 Ceftriaxone 5/19 x 1 Cefepime 5/20 >>off vanco 5/20 >> 5/22  Interim history/subjective:  Hgb 6.1 PLT down some Transfused PRBC Hgb down again PLT stable No hematochezia or other sign of bleeding  Objective   Blood pressure (!) 103/57, pulse 79, temperature 98.2 F (36.8 C), temperature source Oral, resp. rate (!) 32, height 5\' 1"  (1.549 m), weight 47.2 kg, SpO2 94 %. CVP:  [4 mmHg-5 mmHg] 5 mmHg  Vent Mode: PCV FiO2 (%):  [50 %-60 %] 50 % Set Rate:  [32 bmp-35 bmp] 32 bmp PEEP:  [10 cmH20] 10 cmH20 Plateau Pressure:  [30 cmH20-32 cmH20] 30 cmH20   Intake/Output Summary (Last 24 hours) at 03/29/2019 0748 Last data filed at 03/29/2019 0700 Gross per 24 hour  Intake 2590.7 ml  Output 2893 ml  Net -302.3 ml   Filed Weights   03/27/19 0414 03/28/19 0500 03/29/19 0500  Weight: 60 kg 45.2 kg 47.2 kg    Examination:  General:  In bed on vent HENT: NCAT ETT in place PULM:  CTA B, vent supported breathing CV: RRR, no mgr GI: BS+, soft, nontender MSK: normal bulk and tone Neuro: sedated on vent  May 29 chest x-ray images independently reviewed showing increased bibasilar opacities, tubes and lines in place  Resolved Hospital Problem list     Assessment & Plan:  Acute respiratory failure with hypoxemia due to COVID 19 Acute pulmonary edema Continue full mechanical ventilatory support with ARDS protocol Tidal volume target 6 to 8 cc/kg ideal body weight Keep plateau pressure less than 30 Target PaO2 to FiO2 ratio greater than 150 Ventilator associated pneumonia prevention protocol Volume removal as able with CVVHD  AKI  Continue CVVHD per renal Monitor for urine output  Autonomic instability Levophed, titrated for mean arterial pressure greater than 65 Midodrine continue at 10 mg 3 times a day  Need for mechanical ventilation, sedation for vent synchrony Periods of unresponsiveness Continue sedation as needed to maintain ventilator synchrony RA SS target -2  Continued hemoglobin drop with shock, concern for either GI bleeding or retroperitoneal bleed Stat check fecal occult blood in stool Increase PPI to twice daily Check LDH, Haptoglobin to look for Hemolysis Monitor for hematochezia If no evidence of blood in stool then check CT abdomen to look for retroperitoneal bleed  Thrombocytopenia, agree likely related to famotidine, timecourse and drop doesn't fit well with HITT Hold famotidine, continue pantoprazole Check peripheral smear  Best practice:  Diet: tube feeding Pain/Anxiety/Delirium protocol (if indicated):  VAP protocol (  if indicated):  DVT prophylaxis: heparin GI prophylaxis: PPI to bid Glucose control: per TRH Mobility: bed rest Code Status: Full Family Communication: I updated daughter at length on 5/31 by phone Disposition: remain in ICU  Labs   CBC: Recent Labs  Lab 03/25/19 0230  03/26/19 0300  03/27/19 0140   03/28/19 0045  03/28/19 1020 03/28/19 1230 03/28/19 1255 03/29/19 0100 03/29/19 0415  WBC 15.5*  --  21.0*  --  13.4*  --  12.4*  --   --   --   --  11.7*  --   NEUTROABS 14.7*  --  19.6*  --  12.3*  --  11.1*  --   --   --   --  10.3*  --   HGB 8.8*   < > 7.8*   < > 8.8*   < > 7.5*   < > 10.9* 6.5* 6.4* 7.5* 6.1*  HCT 29.5*   < > 25.4*   < > 27.8*   < > 23.1*   < > 32.0* 19.0* 19.8* 23.4* 18.0*  MCV 95.8  --  93.7  --  92.7  --  92.0  --   --   --   --  91.8  --   PLT 76*  --  74*  --  75*  --  83*  --   --   --   --  86*  --    < > = values in this interval not displayed.    Basic Metabolic Panel: Recent Labs  Lab 03/25/19 0230  03/26/19 0300  03/27/19 0140 03/27/19 0145  03/27/19 1530 03/28/19 0045  03/28/19 1020 03/28/19 1230 03/28/19 1535 03/29/19 0100 03/29/19 0415  NA 146*   < > 146*   < >  --  141   < > 138 137   < > 137 137 136 137  137 135  K 4.9   < > 4.6   < >  --  4.9   < > 4.4 4.7   < > 4.8 5.0 5.0 5.0  5.0 4.8  CL 124*  --  124*   < >  --  109  --  108 105  --   --   --  105 105  105  --   CO2 18*  --  15*   < >  --  24  --  25 25  --   --   --  25 25  25   --   GLUCOSE 286*  --  137*   < >  --  139*  --  80 147*  --   --   --  132* 161*  163*  --   BUN 83*  --  112*   < >  --  46*  --  33* 26*  --   --   --  26* 30*  29*  --   CREATININE 3.94*  --  4.80*   < >  --  2.12*  --  1.47* 1.30*  --   --   --  1.29* 1.43*  1.42*  --   CALCIUM 7.7*  --  7.5*   < >  --  7.1*  --  6.9* 6.8*  --   --   --  7.0* 7.0*  6.9*  --   MG 2.5*  --  2.6*  --  2.5*  --   --   --  2.4  --   --   --   --  2.6*  --   PHOS  --   --  3.5   < >  --  2.5  --  2.3* 2.2*  --   --   --  1.8* 2.7  2.7  --    < > = values in this interval not displayed.   GFR: Estimated Creatinine Clearance: 33.2 mL/min (A) (by C-G formula based on SCr of 1.42 mg/dL (H)). Recent Labs  Lab 03/26/19 0300 03/27/19 0140 03/28/19 0045 03/29/19 0100  WBC 21.0* 13.4* 12.4* 11.7*    Liver Function  Tests: Recent Labs  Lab 03/25/19 0230 03/26/19 0300  03/27/19 0140 03/27/19 0145 03/27/19 1530 03/28/19 0045 03/28/19 1535 03/29/19 0100  AST 33 52*  --  55*  --   --  49*  --  59*  ALT 21 22  --  26  --   --  24  --  29  ALKPHOS 108 153*  --  186*  --   --  159*  --  200*  BILITOT 0.4 0.3  --  0.4  --   --  0.1*  --  0.3  PROT 5.4* 5.3*  --  6.1*  --   --  5.5*  --  5.6*  ALBUMIN 1.9* 1.8*  1.8*   < > 2.3* 2.1* 2.0* 1.9*  1.9* 1.8* 2.0*  1.9*   < > = values in this interval not displayed.   No results for input(s): LIPASE, AMYLASE in the last 168 hours. Recent Labs  Lab 03/24/19 0445  AMMONIA 39*    ABG    Component Value Date/Time   PHART 7.413 03/29/2019 0415   PCO2ART 38.7 03/29/2019 0415   PO2ART 72.0 (L) 03/29/2019 0415   HCO3 24.8 03/29/2019 0415   TCO2 26 03/29/2019 0415   ACIDBASEDEF 2.0 03/28/2019 1230   O2SAT 95.0 03/29/2019 0415     Coagulation Profile: No results for input(s): INR, PROTIME in the last 168 hours.  Cardiac Enzymes: No results for input(s): CKTOTAL, CKMB, CKMBINDEX, TROPONINI in the last 168 hours.  HbA1C: Hgb A1c MFr Bld  Date/Time Value Ref Range Status  03/18/2019 10:45 AM 14.9 (H) 4.8 - 5.6 % Final    Comment:    (NOTE) Pre diabetes:          5.7%-6.4% Diabetes:              >6.4% Glycemic control for   <7.0% adults with diabetes   03/18/2019 01:18 AM 14.9 (H) 4.8 - 5.6 % Final    Comment:    (NOTE) Pre diabetes:          5.7%-6.4% Diabetes:              >6.4% Glycemic control for   <7.0% adults with diabetes     CBG: Recent Labs  Lab 03/28/19 1531 03/28/19 2216 03/28/19 2325 03/29/19 0346 03/29/19 0713  GLUCAP 114* 127* 112* 117* 152*      Critical care time: 35 minutes      Roselie Awkward, MD [facility] Pager: [phone] Cell: [phone] If no response, call [phone]

## 2019-03-29 NOTE — Progress Notes (Signed)
Peripherally Inserted Central Catheter/Midline Placement  The IV Nurse has discussed with the patient and/or persons authorized to consent for the patient, the purpose of this procedure and the potential benefits and risks involved with this procedure.  The benefits include less needle sticks, lab draws from the catheter, and the patient may be discharged home with the catheter. Risks include, but not limited to, infection, bleeding, blood clot (thrombus formation), and puncture of an artery; nerve damage and irregular heartbeat and possibility to perform a PICC exchange if needed/ordered by physician.  Alternatives to this procedure were also discussed.  Bard Power PICC patient education guide, fact sheet on infection prevention and patient information card has been provided to patient /or left at bedside.    PICC/Midline Placement Documentation  PICC Double Lumen 03/29/19 PICC Right Brachial 37 cm 0 cm (Active)  Indication for Insertion or Continuance of Line Vasoactive infusions 03/29/2019  4:18 PM  Exposed Catheter (cm) 0 cm 03/29/2019  4:18 PM  Site Assessment Clean;Dry;Intact 03/29/2019  4:18 PM  Lumen #1 Status Flushed;Blood return noted 03/29/2019  4:18 PM  Lumen #2 Status Flushed;Blood return noted 03/29/2019  4:18 PM  Dressing Type Transparent 03/29/2019  4:18 PM  Dressing Status Clean;Dry;Intact;Antimicrobial disc in place 03/29/2019  4:18 PM  Dressing Intervention New dressing 03/29/2019  4:18 PM  Dressing Change Due 04/05/19 03/29/2019  4:18 PM   Telephone consent signed by Daughter    Brian Owen 03/29/2019, 4:19 PM

## 2019-03-29 NOTE — Progress Notes (Signed)
[facility] Progress Note   Subjective: I/O - 300 cc yest , wt's stable, FiO2 0.5  Vitals:   03/29/19 0800 03/29/19 0900 03/29/19 1000 03/29/19 1100  BP: (!) 112/55 (!) 116/56 104/77 (!) 162/65  Pulse: 80 81 80 88  Resp: (!) 32 (!) 23 (!) 32 (!) 32  Temp: 98.7 F (37.1 C)   98.2 F (36.8 C)  TempSrc: Oral   Oral  SpO2: 93% 92% 91% (!) 87%  Weight:      Height:        Inpatient medications: . sodium chloride   Intravenous Once  . B-complex with vitamin C  1 tablet Per Tube Daily  . chlorhexidine  15 mL Mouth/Throat BID  . Chlorhexidine Gluconate Cloth  6 each Topical Q0600  . heparin injection (subcutaneous)  5,000 Units Subcutaneous Q8H  . insulin aspart  0-15 Units Subcutaneous Q4H  . insulin detemir  15 Units Subcutaneous BID  . mouth rinse  15 mL Mouth Rinse 10 times per day  . midodrine  10 mg Per Tube Q8H  . pantoprazole (PROTONIX) IV  40 mg Intravenous Q12H  . pneumococcal 23 valent vaccine  0.5 mL Intramuscular Tomorrow-1000   .  prismasol BGK 4/2.5 400 mL/hr at 03/29/19 8206  .  prismasol BGK 4/2.5 200 mL/hr at 03/28/19 1303  . sodium chloride 10 mL/hr at 03/29/19 1057  . feeding supplement (VITAL AF 1.2 CAL) 1,000 mL (03/28/19 0926)  . heparin 10,000 units/ 20 mL infusion syringe 1,000 Units/hr (03/29/19 1059)  . HYDROmorphone 1 mg/hr (03/29/19 0948)  . ketamine infusion 500 mg in sodium chloride 0.9% 100 mL Stopped (03/28/19 1230)  . norepinephrine (LEVOPHED) Adult infusion 9.6 mcg/min (03/29/19 1059)  . prismasol BGK 4/2.5 1,400 mL/hr at 03/29/19 1100   sodium chloride, acetaminophen (TYLENOL) oral liquid 160 mg/5 mL, acetaminophen, heparin, heparin, HYDROmorphone, lip balm, midazolam    date                Creat               eGFR  2009- 2016     0.6- 1.0  2017- 2019     1.2- 1.06 Nov 2018        2.0- 2.7            27- 34  Feb 2020        2.0- 2.3            33- 38  03/20/2019 3.36  admission   Exam: Patient not examined  directly given COVID-19 + status, utilizing exam of the primary team and observations of RN's.    Home meds:  - insulin glargine 9u qd   CXR 5/19 > Worsening hazy multifocal airspace opacities concerning for progression of viral pneumonia  CXR 5/27 > Increased interstitial and airspace disease compatible with infection and ARDS  CXR 5/29 > improved from 5/27    UA 5/15 > 30 prot, 0-5 rbc/ wbc  UA 5/20 > 100 prot, 0-5 rbc/ wbc  Renal US 5/26 - Right nephrolithiasis.  Benign-appearing cyst in the left kidney measures 2.2 cm.  No evidence of hydronephrosis. Foley catheter decompresses the bladder.   Assessment: 1 AKI on CKD 3/4 -baseline creat 2.0.  CRRT started 5/27 via L IJ cath. UOP has dropped off.  - cont CRRTd#4 - I/O yest -300 cc, wt's stable - cont IV heparin through CRRT circuit  at  ^1000u/hr standing dose - FiO2 down 50% - on levo gtt 9 ug/min    2  COVID + viral PNA - per CCM  3  IDDM  4  Tobacco smoker  5  Met acidosis - from A/ CRF/ resp infection. Stable.     [facility] 03/29/2019, 12:38 PM  Iron/TIBC/Ferritin/ %Sat    Component Value Date/Time   IRON 59 05/15/2014 0538   TIBC 163 (L) 05/15/2014 0538   FERRITIN 122 03/29/2019 0100   IRONPCTSAT 36 05/15/2014 0538   Recent Labs  Lab 03/29/19 0100 03/29/19 0415  NA 137  137 135  K 5.0  5.0 4.8  CL 105  105  --   CO2 25  25  --   GLUCOSE 161*  163*  --   BUN 30*  29*  --   CREATININE 1.43*  1.42*  --   CALCIUM 7.0*  6.9*  --   PHOS 2.7  2.7  --   ALBUMIN 2.0*  1.9*  --    Recent Labs  Lab 03/29/19 0100  AST 59*  ALT 29  ALKPHOS 200*  BILITOT 0.3  PROT 5.6*   Recent Labs  Lab 03/29/19 0100 03/29/19 0415  WBC 11.7*  --   HGB 7.5* 6.1*  HCT 23.4* 18.0*  PLT 86*  --

## 2019-03-29 NOTE — Progress Notes (Signed)
Dr. Maryland Pink (hospitalist) notified of critical value PTT>200 (same as past two days). MD called back, no further orders at this time, will continue to monitor.   Henreitta Leber, RN

## 2019-03-29 NOTE — Progress Notes (Addendum)
PROGRESS NOTE  Brian Owen VOH:[id number] DOB: Aug 01, 1951 DOA: 03/10/2019 PCP: Alfonse Spruce, FNP   LOS: 11 days   Brief Narrative / Interim history: 68 year old male with history of hypertension, diabetes, chronic kidney disease stage III, multiple prior admissions for orthostatic hypotension related to autonomic dysfunction-noncompliant with Midodrine, who was admitted to the hospital on 03/03/2019 with fever, worsening renal failure, as well as multifocal pneumonia on chest x-ray.  He was found to be Covid positive  Significant Events: 5/19 admit 5/22 saturations declining 5/23 transferred to ICU - intubated - R IJ CVL  COVID-19 specific Treatment: Actemra 5/21 & 5/23 Steroids 5/21 > 5/23  Subjective: Remains intubated on vent.  Hypotensive last night  Assessment & Plan: Principal Problem:   Acute respiratory disease due to COVID-19 virus Active Problems:   Type 2 diabetes mellitus with hyperglycemia, with long-term current use of insulin (HCC)   AKI (acute kidney injury) ([place])   Severe sepsis (HCC)   Acute respiratory failure with hypoxia (HCC)   Pressure injury of skin   Principal Problem Acute Hypoxic Respiratory Failure due to Covid-19 Viral Illness -Remains on vent support, PCCM following.  Vent Mode: PCV FiO2 (%):  [50 %-60 %] 50 % Set Rate:  [32 bmp-35 bmp] 32 bmp PEEP:  [8 cmH20-10 cmH20] 8 cmH20 Plateau Pressure:  [30 IRS85-46 cmH20] 30 cmH20  -Not a candidate for Remdesivir due to renal failure -Has received Actemra x2 -Has received Solu-Medrol x3 days up until 5/23  COVID-19 Labs  Recent Labs    03/27/19 0140 03/27/19 0145 03/28/19 0045 03/29/19 0100  DDIMER 2.51*  --  3.79* 4.99*  FERRITIN  --  253 149 122  CRP  --  10.5* 12.3* 14.4*    Lab Results  Component Value Date   SARSCOV2NAA POSITIVE (A) 03/12/2019     Active Problems: Acute blood loss anemia -Patient's hemoglobin has been around 7.5-8.8 in the last couple of days, but  dropped to 6.5 yesterday.  Received 2 units of packed red blood cells with adequate improvement around 7.5 but dropped again this morning at 6.1.  Unclear to etiology.  No melena or bright red blood per nursing staff but will continue to monitor.  Will check a fecal occult, increase Protonix to twice daily.  Autonomic dysfunction /chronic orthostatic hypotension -Cont midodrine per tube, on Levophed  Acute kidney injury on chronic kidney disease stage III -Baseline creatinine around 2.0, urine output was minimal, did not respond to Lasix and patient was started on CRRT 5/28 -Nephrology following  Type 2 diabetes mellitus  -Continue regimen as below, CBGs well-controlled  Mildly elevated troponin -Due to demand  Positive blood culture - Coag neg Staph + Diptheroids (1 of 2 blood cx) -Consistent with contamination  Scheduled Meds: . sodium chloride   Intravenous Once  . B-complex with vitamin C  1 tablet Per Tube Daily  . chlorhexidine  15 mL Mouth/Throat BID  . Chlorhexidine Gluconate Cloth  6 each Topical Q0600  . heparin injection (subcutaneous)  5,000 Units Subcutaneous Q8H  . insulin aspart  0-15 Units Subcutaneous Q4H  . insulin detemir  15 Units Subcutaneous BID  . mouth rinse  15 mL Mouth Rinse 10 times per day  . midodrine  10 mg Per Tube Q8H  . pantoprazole (PROTONIX) IV  40 mg Intravenous Q12H  . pneumococcal 23 valent vaccine  0.5 mL Intramuscular Tomorrow-1000   Continuous Infusions: .  prismasol BGK 4/2.5 400 mL/hr at 03/29/19 2703  .  prismasol BGK  4/2.5 200 mL/hr at 03/28/19 1303  . sodium chloride 10 mL/hr at 03/29/19 1057  . feeding supplement (VITAL AF 1.2 CAL) 1,000 mL (03/28/19 0926)  . heparin 10,000 units/ 20 mL infusion syringe 1,000 Units/hr (03/29/19 0100)  . HYDROmorphone 1 mg/hr (03/29/19 0948)  . ketamine infusion 500 mg in sodium chloride 0.9% 100 mL Stopped (03/28/19 1230)  . norepinephrine (LEVOPHED) Adult infusion 9.6 mcg/min (03/29/19 1059)  .  prismasol BGK 4/2.5 1,400 mL/hr at 03/29/19 0724   PRN Meds:.sodium chloride, acetaminophen (TYLENOL) oral liquid 160 mg/5 mL, acetaminophen, heparin, heparin, HYDROmorphone, lip balm, midazolam  DVT prophylaxis: heparin Code Status: Full code Family Communication: none  Disposition Plan: TBD  Consultants:   PCCM   Procedures:   None   Antimicrobials: Azithro 5/19 x 1 Ceftriaxone 5/19 x 1 Cefepime 5/20 >>off vanco 5/20 >> 5/22   Objective: Vitals:   03/29/19 0645 03/29/19 0700 03/29/19 0715 03/29/19 0800  BP: (!) 102/53 (!) 111/55 (!) 103/57 (!) 112/55  Pulse: 77 79 79 80  Resp: (!) 32 (!) 32 (!) 32 (!) 32  Temp:    98.7 F (37.1 C)  TempSrc:    Oral  SpO2: 92% 92% 94% 93%  Weight:      Height:        Intake/Output Summary (Last 24 hours) at 03/29/2019 1059 Last data filed at 03/29/2019 0900 Gross per 24 hour  Intake 2282.84 ml  Output 2723 ml  Net -440.16 ml   Filed Weights   03/27/19 0414 03/28/19 0500 03/29/19 0500  Weight: 60 kg 45.2 kg 47.2 kg    Examination:  Constitutional: Intubated, sedated Eyes: No icterus seen ENMT: Dry mucous membranes Neck: normal, supple Respiratory: Appears comfortable on the vent, no wheezing, no crackles Cardiovascular: Regular rate and rhythm, no murmurs heard.  No peripheral edema Abdomen: Soft, positive bowel sounds Musculoskeletal: no clubbing / cyanosis.  Skin: No new rashes Neurologic: Sedated on vent   Data Reviewed: I have independently reviewed following labs and imaging studies   CBC: Recent Labs  Lab 03/25/19 0230  03/26/19 0300  03/27/19 0140  03/28/19 0045  03/28/19 1020 03/28/19 1230 03/28/19 1255 03/29/19 0100 03/29/19 0415  WBC 15.5*  --  21.0*  --  13.4*  --  12.4*  --   --   --   --  11.7*  --   NEUTROABS 14.7*  --  19.6*  --  12.3*  --  11.1*  --   --   --   --  10.3*  --   HGB 8.8*   < > 7.8*   < > 8.8*   < > 7.5*   < > 10.9* 6.5* 6.4* 7.5* 6.1*  HCT 29.5*   < > 25.4*   < > 27.8*   < >  23.1*   < > 32.0* 19.0* 19.8* 23.4* 18.0*  MCV 95.8  --  93.7  --  92.7  --  92.0  --   --   --   --  91.8  --   PLT 76*  --  74*  --  75*  --  83*  --   --   --   --  86*  --    < > = values in this interval not displayed.   Basic Metabolic Panel: Recent Labs  Lab 03/25/19 0230  03/26/19 0300  03/27/19 0140 03/27/19 0145  03/27/19 1530 03/28/19 0045  03/28/19 1020 03/28/19 1230 03/28/19 1535 03/29/19 0100 03/29/19 0415  NA 146*   < > 146*   < >  --  141   < > 138 137   < > 137 137 136 137  137 135  K 4.9   < > 4.6   < >  --  4.9   < > 4.4 4.7   < > 4.8 5.0 5.0 5.0  5.0 4.8  CL 124*  --  124*   < >  --  109  --  108 105  --   --   --  105 105  105  --   CO2 18*  --  15*   < >  --  24  --  25 25  --   --   --  25 25  25   --   GLUCOSE 286*  --  137*   < >  --  139*  --  80 147*  --   --   --  132* 161*  163*  --   BUN 83*  --  112*   < >  --  46*  --  33* 26*  --   --   --  26* 30*  29*  --   CREATININE 3.94*  --  4.80*   < >  --  2.12*  --  1.47* 1.30*  --   --   --  1.29* 1.43*  1.42*  --   CALCIUM 7.7*  --  7.5*   < >  --  7.1*  --  6.9* 6.8*  --   --   --  7.0* 7.0*  6.9*  --   MG 2.5*  --  2.6*  --  2.5*  --   --   --  2.4  --   --   --   --  2.6*  --   PHOS  --   --  3.5   < >  --  2.5  --  2.3* 2.2*  --   --   --  1.8* 2.7  2.7  --    < > = values in this interval not displayed.   GFR: Estimated Creatinine Clearance: 33.2 mL/min (A) (by C-G formula based on SCr of 1.42 mg/dL (H)). Liver Function Tests: Recent Labs  Lab 03/25/19 0230 03/26/19 0300  03/27/19 0140 03/27/19 0145 03/27/19 1530 03/28/19 0045 03/28/19 1535 03/29/19 0100  AST 33 52*  --  55*  --   --  49*  --  59*  ALT 21 22  --  26  --   --  24  --  29  ALKPHOS 108 153*  --  186*  --   --  159*  --  200*  BILITOT 0.4 0.3  --  0.4  --   --  0.1*  --  0.3  PROT 5.4* 5.3*  --  6.1*  --   --  5.5*  --  5.6*  ALBUMIN 1.9* 1.8*  1.8*   < > 2.3* 2.1* 2.0* 1.9*  1.9* 1.8* 2.0*  1.9*   < > = values  in this interval not displayed.   No results for input(s): LIPASE, AMYLASE in the last 168 hours. Recent Labs  Lab 03/24/19 0445  AMMONIA 39*   Coagulation Profile: No results for input(s): INR, PROTIME in the last 168 hours. Cardiac Enzymes: No results for input(s): CKTOTAL, CKMB, CKMBINDEX, TROPONINI in the last 168 hours. BNP (last 3 results) No results for input(s): PROBNP in the  last 8760 hours. HbA1C: No results for input(s): HGBA1C in the last 72 hours. CBG: Recent Labs  Lab 03/28/19 1531 03/28/19 2216 03/28/19 2325 03/29/19 0346 03/29/19 0713  GLUCAP 114* 127* 112* 117* 152*   Lipid Profile: Recent Labs    03/26/19 2205  TRIG 277*   Thyroid Function Tests: No results for input(s): TSH, T4TOTAL, FREET4, T3FREE, THYROIDAB in the last 72 hours. Anemia Panel: Recent Labs    03/28/19 0045 03/29/19 0100  FERRITIN 149 122   Urine analysis:    Component Value Date/Time   COLORURINE YELLOW 03/18/2019 0048   APPEARANCEUR CLEAR 03/18/2019 0048   LABSPEC 1.013 03/18/2019 0048   PHURINE 5.0 03/18/2019 0048   GLUCOSEU >=500 (A) 03/18/2019 0048   HGBUR LARGE (A) 03/18/2019 0048   BILIRUBINUR NEGATIVE 03/18/2019 0048   BILIRUBINUR neg 09/05/2017 1111   KETONESUR 5 (A) 03/18/2019 0048   PROTEINUR 100 (A) 03/18/2019 0048   UROBILINOGEN 0.2 09/05/2017 1111   UROBILINOGEN 0.2 07/22/2015 1941   NITRITE NEGATIVE 03/18/2019 0048   LEUKOCYTESUR NEGATIVE 03/18/2019 0048   Sepsis Labs: Invalid input(s): PROCALCITONIN, LACTICIDVEN  Recent Results (from the past 240 hour(s))  MRSA PCR Screening     Status: None   Collection Time: 03/19/19  2:00 PM  Result Value Ref Range Status   MRSA by PCR NEGATIVE NEGATIVE Final    Comment:        The GeneXpert MRSA Assay (FDA approved for NASAL specimens only), is one component of a comprehensive MRSA colonization surveillance program. It is not intended to diagnose MRSA infection nor to guide or monitor treatment for MRSA  infections. Performed at [facility], [place] [address], [place], [place] [address]   Culture, respiratory (non-expectorated)     Status: None   Collection Time: 03/21/19  2:42 PM  Result Value Ref Range Status   Specimen Description   Final    TRACHEAL ASPIRATE Performed at [facility] [address] [place], [place] [address]    Special Requests   Final    NONE Performed at [facility], [place] [address], [place], [place] [address]    Gram Stain   Final    RARE WBC PRESENT,BOTH PMN AND MONONUCLEAR RARE YEAST Performed at [facility], [place] [address] [place], [place] [address]    Culture FEW CANDIDA ALBICANS  Final   Report Status 03/24/2019 FINAL  Final      Radiology Studies: [place] Ekg Site Rite  Result Date: 03/29/2019 If Site Rite image not attached, placement could not be confirmed due to current cardiac rhythm.   Marzetta Board, MD, PhD [facility]  Contact via  [email or web address]  [place] P: [phone]  F: [phone]

## 2019-03-30 ENCOUNTER — Inpatient Hospital Stay (HOSPITAL_COMMUNITY): Payer: Medicare Other

## 2019-03-30 LAB — POCT I-STAT 7, (LYTES, BLD GAS, ICA,H+H)
Acid-base deficit: 2 mmol/L (ref 0.0–2.0)
Acid-base deficit: 3 mmol/L — ABNORMAL HIGH (ref 0.0–2.0)
Bicarbonate: 24.4 mmol/L (ref 20.0–28.0)
Bicarbonate: 23.8 mmol/L (ref 20.0–28.0)
Calcium, Ion: 1.12 mmol/L — ABNORMAL LOW (ref 1.15–1.40)
Calcium, Ion: 1.14 mmol/L — ABNORMAL LOW (ref 1.15–1.40)
HCT: 24 % — ABNORMAL LOW (ref 39.0–52.0)
HCT: 25 % — ABNORMAL LOW (ref 39.0–52.0)
Hemoglobin: 8.2 g/dL — ABNORMAL LOW (ref 13.0–17.0)
Hemoglobin: 8.5 g/dL — ABNORMAL LOW (ref 13.0–17.0)
O2 Saturation: 82 %
O2 Saturation: 88 %
Patient temperature: 97.5
Patient temperature: 96.2
Potassium: 5 mmol/L (ref 3.5–5.1)
Potassium: 4.8 mmol/L (ref 3.5–5.1)
Sodium: 136 mmol/L (ref 135–145)
Sodium: 136 mmol/L (ref 135–145)
TCO2: 26 mmol/L (ref 22–32)
TCO2: 25 mmol/L (ref 22–32)
pCO2 arterial: 44.7 mmHg (ref 32.0–48.0)
pCO2 arterial: 45.8 mmHg (ref 32.0–48.0)
pH, Arterial: 7.341 — ABNORMAL LOW (ref 7.350–7.450)
pH, Arterial: 7.316 — ABNORMAL LOW (ref 7.350–7.450)
pO2, Arterial: 48 mmHg — ABNORMAL LOW (ref 83.0–108.0)
pO2, Arterial: 56 mmHg — ABNORMAL LOW (ref 83.0–108.0)

## 2019-03-30 LAB — BPAM RBC
Blood Product Expiration Date: 202006032359
Blood Product Expiration Date: 202006102359
ISSUE DATE / TIME: 202005301701
ISSUE DATE / TIME: 202005311059
Unit Type and Rh: 7300
Unit Type and Rh: 7300

## 2019-03-30 LAB — RENAL FUNCTION PANEL
Albumin: 2.1 g/dL — ABNORMAL LOW (ref 3.5–5.0)
Albumin: 2.2 g/dL — ABNORMAL LOW (ref 3.5–5.0)
Anion gap: 6 (ref 5–15)
Anion gap: 7 (ref 5–15)
BUN: 26 mg/dL — ABNORMAL HIGH (ref 8–23)
BUN: 24 mg/dL — ABNORMAL HIGH (ref 8–23)
CO2: 26 mmol/L (ref 22–32)
CO2: 26 mmol/L (ref 22–32)
Calcium: 7.1 mg/dL — ABNORMAL LOW (ref 8.9–10.3)
Calcium: 7.3 mg/dL — ABNORMAL LOW (ref 8.9–10.3)
Chloride: 102 mmol/L (ref 98–111)
Chloride: 105 mmol/L (ref 98–111)
Creatinine, Ser: 1.09 mg/dL (ref 0.61–1.24)
Creatinine, Ser: 1.07 mg/dL (ref 0.61–1.24)
GFR calc Af Amer: >60 mL/min
GFR calc Af Amer: >60 mL/min (ref >60)
GFR calc non Af Amer: >60 mL/min
GFR calc non Af Amer: >60 mL/min (ref >60)
Glucose, Bld: 155 mg/dL — ABNORMAL HIGH (ref 70–99)
Glucose, Bld: 49 mg/dL — ABNORMAL LOW (ref 70–99)
Phosphorus: 2.4 mg/dL — ABNORMAL LOW (ref 2.5–4.6)
Phosphorus: 2.2 mg/dL — ABNORMAL LOW (ref 2.5–4.6)
Potassium: 5 mmol/L (ref 3.5–5.1)
Potassium: 4.2 mmol/L (ref 3.5–5.1)
Sodium: 134 mmol/L — ABNORMAL LOW (ref 135–145)
Sodium: 138 mmol/L (ref 135–145)

## 2019-03-30 LAB — TYPE AND SCREEN
ABO/RH(D): B POS
Antibody Screen: NEGATIVE
Unit division: 0
Unit division: 0

## 2019-03-30 LAB — CBC
HCT: 27.8 % — ABNORMAL LOW (ref 39.0–52.0)
HCT: 26.3 % — ABNORMAL LOW (ref 39.0–52.0)
HCT: 23.9 % — ABNORMAL LOW (ref 39.0–52.0)
Hemoglobin: 8.9 g/dL — ABNORMAL LOW (ref 13.0–17.0)
Hemoglobin: 8.5 g/dL — ABNORMAL LOW (ref 13.0–17.0)
Hemoglobin: 7.9 g/dL — ABNORMAL LOW (ref 13.0–17.0)
MCH: 29.6 pg (ref 26.0–34.0)
MCH: 29.6 pg (ref 26.0–34.0)
MCH: 30.9 pg (ref 26.0–34.0)
MCHC: 32 g/dL (ref 30.0–36.0)
MCHC: 32.3 g/dL (ref 30.0–36.0)
MCHC: 33.1 g/dL (ref 30.0–36.0)
MCV: 92.4 fL (ref 80.0–100.0)
MCV: 91.6 fL (ref 80.0–100.0)
MCV: 93.4 fL (ref 80.0–100.0)
Platelets: 82 K/uL — ABNORMAL LOW (ref 150–400)
Platelets: 87 10*3/uL — ABNORMAL LOW (ref 150–400)
Platelets: 81 10*3/uL — ABNORMAL LOW (ref 150–400)
RBC: 3.01 MIL/uL — ABNORMAL LOW (ref 4.22–5.81)
RBC: 2.87 MIL/uL — ABNORMAL LOW (ref 4.22–5.81)
RBC: 2.56 MIL/uL — ABNORMAL LOW (ref 4.22–5.81)
RDW: 14.7 % (ref 11.5–15.5)
RDW: 14.8 % (ref 11.5–15.5)
RDW: 15.4 % (ref 11.5–15.5)
WBC: 11.8 K/uL — ABNORMAL HIGH (ref 4.0–10.5)
WBC: 11.2 10*3/uL — ABNORMAL HIGH (ref 4.0–10.5)
WBC: 11.7 10*3/uL — ABNORMAL HIGH (ref 4.0–10.5)
nRBC: 2.3 % — ABNORMAL HIGH (ref 0.0–0.2)
nRBC: 3.2 % — ABNORMAL HIGH (ref 0.0–0.2)
nRBC: 4.5 % — ABNORMAL HIGH (ref 0.0–0.2)

## 2019-03-30 LAB — HEPATIC FUNCTION PANEL
ALT: 31 U/L (ref 0–44)
AST: 62 U/L — ABNORMAL HIGH (ref 15–41)
Albumin: 2.1 g/dL — ABNORMAL LOW (ref 3.5–5.0)
Alkaline Phosphatase: 204 U/L — ABNORMAL HIGH (ref 38–126)
Bilirubin, Direct: 0.1 mg/dL (ref 0.0–0.2)
Indirect Bilirubin: 0.1 mg/dL — ABNORMAL LOW (ref 0.3–0.9)
Total Bilirubin: 0.2 mg/dL — ABNORMAL LOW (ref 0.3–1.2)
Total Protein: 5.8 g/dL — ABNORMAL LOW (ref 6.5–8.1)

## 2019-03-30 LAB — TSH: TSH: 3.082 u[IU]/mL (ref 0.350–4.500)

## 2019-03-30 LAB — GLUCOSE, CAPILLARY
Glucose-Capillary: 116 mg/dL — ABNORMAL HIGH (ref 70–99)
Glucose-Capillary: 129 mg/dL — ABNORMAL HIGH (ref 70–99)
Glucose-Capillary: 139 mg/dL — ABNORMAL HIGH (ref 70–99)
Glucose-Capillary: 142 mg/dL — ABNORMAL HIGH (ref 70–99)
Glucose-Capillary: 150 mg/dL — ABNORMAL HIGH (ref 70–99)
Glucose-Capillary: 88 mg/dL (ref 70–99)

## 2019-03-30 LAB — BASIC METABOLIC PANEL WITH GFR
Anion gap: 6 (ref 5–15)
BUN: 26 mg/dL — ABNORMAL HIGH (ref 8–23)
CO2: 27 mmol/L (ref 22–32)
Calcium: 7.1 mg/dL — ABNORMAL LOW (ref 8.9–10.3)
Chloride: 102 mmol/L (ref 98–111)
Creatinine, Ser: 1.08 mg/dL (ref 0.61–1.24)
GFR calc Af Amer: >60 mL/min
GFR calc non Af Amer: >60 mL/min
Glucose, Bld: 155 mg/dL — ABNORMAL HIGH (ref 70–99)
Potassium: 5 mmol/L (ref 3.5–5.1)
Sodium: 135 mmol/L (ref 135–145)

## 2019-03-30 LAB — MAGNESIUM: Magnesium: 2.5 mg/dL — ABNORMAL HIGH (ref 1.7–2.4)

## 2019-03-30 LAB — CORTISOL: Cortisol, Plasma: 17.7 ug/dL

## 2019-03-30 LAB — APTT: aPTT: >200 s (ref 24–36)

## 2019-03-30 MED ORDER — DEXTROSE 50 % IV SOLN
INTRAVENOUS | Status: AC
  Administered 2019-03-30: 50 mL
  Filled 2019-03-30: qty 50

## 2019-03-30 MED ORDER — DEXTROSE 50 % IV SOLN
INTRAVENOUS | Status: AC
  Administered 2019-03-30: 35 mL
  Filled 2019-03-30: qty 50

## 2019-03-30 MED ORDER — MIDAZOLAM 50MG/50ML (1MG/ML) PREMIX INFUSION
0.0000 mg/h | INTRAVENOUS | Status: DC
  Administered 2019-03-30: 5 mg/h via INTRAVENOUS
  Administered 2019-03-30: 1 mg/h via INTRAVENOUS
  Filled 2019-03-30 (×2): qty 50

## 2019-03-30 MED ORDER — MIDAZOLAM BOLUS VIA INFUSION
1.0000 mg | INTRAVENOUS | Status: DC | PRN
  Administered 2019-03-30: 2 mg via INTRAVENOUS
  Filled 2019-03-30: qty 2

## 2019-03-30 MED ORDER — PRISMASOL BGK 0/2.5 32-2.5 MEQ/L IV SOLN
INTRAVENOUS | Status: AC
  Administered 2019-03-30 – 2019-03-31 (×8): via INTRAVENOUS_CENTRAL
  Filled 2019-03-30 (×9): qty 5000

## 2019-03-30 NOTE — Progress Notes (Signed)
Dr. Sherral Hammers (hospitalist) text-paged to notify of critical value: PTT>200 (same as past few days). No visible bleeding noted on assessment. PICC insertion site no longer oozing. Pt is on subQ heparin as well as heparin via CRRT syringe.   Post transfusion H&H (5/31 20:40):  Hgb = 8.9 Hct = 27.8%  Morning H&H (6/1 0220): Hgb = 8.5 Hct = 26.3%  No further orders at this time. Will continue to monitor.   Henreitta Leber, RN

## 2019-03-30 NOTE — Progress Notes (Signed)
PROGRESS NOTE  Brian Owen SUP:[id number] DOB: Dec 02, 1950 DOA: 03/22/2019 PCP: Alfonse Spruce, FNP   LOS: 12 days   Brief Narrative / Interim history: 68 year old male with history of hypertension, diabetes, chronic kidney disease stage III, multiple prior admissions for orthostatic hypotension related to autonomic dysfunction-noncompliant with Midodrine, who was admitted to the hospital on 03/07/2019 with fever, worsening renal failure, as well as multifocal pneumonia on chest x-ray.  He was found to be Covid positive  Significant Events: 5/19 admit 5/22 saturations declining 5/23 transferred to ICU - intubated - R IJ CVL  COVID-19 specific Treatment: Actemra 5/21 & 5/23 Steroids 5/21 > 5/23  Subjective: Remains on the vent, alert and slightly agitated this morning.  Hypothermic and with increased pressor requirements overnight  Assessment & Plan: Principal Problem:   Acute respiratory disease due to COVID-19 virus Active Problems:   Type 2 diabetes mellitus with hyperglycemia, with long-term current use of insulin (HCC)   AKI (acute kidney injury) ([place])   Severe sepsis (HCC)   Acute respiratory failure with hypoxia (HCC)   Pressure injury of skin   Principal Problem Acute Hypoxic Respiratory Failure due to Covid-19 Viral Illness/ARDS -Remains on vent support, PCCM managing  Vent Mode: PCV FiO2 (%):  [50 %-60 %] 60 % Set Rate:  [32 bmp] 32 bmp PEEP:  [8 cmH20] 8 cmH20 Plateau Pressure:  [25 cmH20-32 cmH20] 25 cmH20  -Not a candidate for Remdesivir due to renal failure -Has received Actemra x2 -Has received Solu-Medrol x3 days up until 5/23 -Remains quite hypoxic this morning.  Does not follow commands.  Check cortisol and TSH given hypothermia and hypotension last night, but concern for infection.  No significant increase in secretions.  Will check a chest x-ray  COVID-19 Labs  Recent Labs    03/28/19 0045 03/29/19 0100 03/29/19 1845  DDIMER 3.79* 4.99*  --    FERRITIN 149 122  --   LDH  --   --  420*  CRP 12.3* 14.4*  --     Lab Results  Component Value Date   SARSCOV2NAA POSITIVE (A) 03/05/2019     Active Problems: Anemia (question acute blood loss) -Patient's hemoglobin has been around 7.5-8.8 in the last couple of days, but dropped to 6.5 on 5/30.  Received 1 units of packed red blood cells with adequate improvement around 7.5 but dropped again this morning at 6.1.  Unclear to etiology.  No melena or bright red blood per nursing staff but will continue to monitor.  -Fecal occult pending, also check haptoglobin for hemolysis.  Smear was sent for pathology review  Autonomic dysfunction /chronic orthostatic hypotension -Cont midodrine per tube, on Levophed  Acute kidney injury on chronic kidney disease stage III -Baseline creatinine around 2.0, urine output was minimal, did not respond to Lasix and patient was started on CRRT 5/28 -Nephrology following  Type 2 diabetes mellitus  -Continue regimen as below, CBGs well-controlled  Mildly elevated troponin -Due to demand  Positive blood culture - Coag neg Staph + Diptheroids (1 of 2 blood cx) -Consistent with contamination  Scheduled Meds: . B-complex with vitamin C  1 tablet Per Tube Daily  . chlorhexidine  15 mL Mouth/Throat BID  . Chlorhexidine Gluconate Cloth  6 each Topical Q0600  . Chlorhexidine Gluconate Cloth  6 each Topical Daily  . heparin injection (subcutaneous)  5,000 Units Subcutaneous Q8H  . insulin aspart  0-15 Units Subcutaneous Q4H  . insulin detemir  15 Units Subcutaneous BID  . mouth rinse  15 mL Mouth Rinse 10 times per day  . midodrine  10 mg Per Tube Q8H  . pantoprazole (PROTONIX) IV  40 mg Intravenous Q12H  . pneumococcal 23 valent vaccine  0.5 mL Intramuscular Tomorrow-1000  . sodium chloride flush  10-40 mL Intracatheter Q12H  . Thrombi-Pad  1 each Topical Once   Continuous Infusions: .  prismasol BGK 4/2.5 400 mL/hr at 03/30/19 0742  .  prismasol  BGK 4/2.5 200 mL/hr at 03/30/19 1004  . sodium chloride 10 mL/hr at 03/30/19 1200  . feeding supplement (VITAL AF 1.2 CAL) 1,000 mL (03/29/19 1435)  . heparin 10,000 units/ 20 mL infusion syringe 1,000 Units/hr (03/30/19 1200)  . HYDROmorphone 4 mg/hr (03/30/19 1200)  . midazolam 5 mg/hr (03/30/19 1211)  . norepinephrine (LEVOPHED) Adult infusion 12 mcg/min (03/30/19 1200)  . prismasol BGK 4/2.5 1,400 mL/hr at 03/30/19 0916   PRN Meds:.sodium chloride, acetaminophen (TYLENOL) oral liquid 160 mg/5 mL, acetaminophen, heparin, heparin, HYDROmorphone, lip balm, midazolam, sodium chloride flush  DVT prophylaxis: heparin Code Status: Full code Family Communication: none  Disposition Plan: TBD  Consultants:   PCCM   Procedures:   None   Antimicrobials: Azithro 5/19 x 1 Ceftriaxone 5/19 x 1 Cefepime 5/20 >>off vanco 5/20 >> 5/22   Objective: Vitals:   03/30/19 1130 03/30/19 1145 03/30/19 1200 03/30/19 1215  BP: (!) 81/47 (!) 94/53 (!) 105/52 110/72  Pulse: 70 65 72 75  Resp: (!) 32 (!) 32 (!) 32 (!) 22  Temp:      TempSrc:      SpO2: 92% 96% 96% 96%  Weight:      Height:        Intake/Output Summary (Last 24 hours) at 03/30/2019 1236 Last data filed at 03/30/2019 1200 Gross per 24 hour  Intake 2396.48 ml  Output 3371 ml  Net -974.52 ml   Filed Weights   03/28/19 0500 03/29/19 0500 03/30/19 0500  Weight: 45.2 kg 47.2 kg 45.1 kg    Examination:  Constitutional: Intubated but quite alert, nonpurposeful movements, mittens on Eyes: No scleral icterus ENMT: Dry mucous membranes Neck: Normal, supple Respiratory: Distant breath sounds, no wheezing, no crackles Cardiovascular: Regular rate and rhythm, no murmurs heard.  No peripheral edema Abdomen: Soft, positive bowel sounds Musculoskeletal: no clubbing / cyanosis.  Skin: No rash seen Neurologic: Sedated on vent   Data Reviewed: I have independently reviewed following labs and imaging studies   CBC: Recent Labs   Lab 04-22-19 0230  03/26/19 0300  03/27/19 0140  03/28/19 0045  03/29/19 0100 03/29/19 0415 03/29/19 2042 03/30/19 0220 03/30/19 0451 03/30/19 1050  WBC 15.5*  --  21.0*  --  13.4*  --  12.4*  --  11.7*  --  11.8* 11.2*  --   --   NEUTROABS 14.7*  --  19.6*  --  12.3*  --  11.1*  --  10.3*  --   --   --   --   --   HGB 8.8*   < > 7.8*   < > 8.8*   < > 7.5*   < > 7.5* 6.1* 8.9* 8.5* 8.2* 8.5*  HCT 29.5*   < > 25.4*   < > 27.8*   < > 23.1*   < > 23.4* 18.0* 27.8* 26.3* 24.0* 25.0*  MCV 95.8  --  93.7  --  92.7  --  92.0  --  91.8  --  92.4 91.6  --   --   PLT 76*  --  74*  --  75*  --  83*  --  86*  --  82* 87*  --   --    < > = values in this interval not displayed.   Basic Metabolic Panel: Recent Labs  Lab 03/26/19 0300  03/27/19 0140  03/28/19 0045  03/28/19 1535 03/29/19 0100 03/29/19 0415 03/29/19 1845 03/30/19 0220 03/30/19 0451 03/30/19 1050  NA 146*   < >  --    < > 137   < > 136 137  137 135 135 135  134* 136 136  K 4.6   < >  --    < > 4.7   < > 5.0 5.0  5.0 4.8 5.0 5.0  5.0 5.0 4.8  CL 124*   < >  --    < > 105  --  105 105  105  --  104 102  102  --   --   CO2 15*   < >  --    < > 25  --  25 25  25   --  25 27  26   --   --   GLUCOSE 137*   < >  --    < > 147*  --  132* 161*  163*  --  130* 155*  155*  --   --   BUN 112*   < >  --    < > 26*  --  26* 30*  29*  --  27* 26*  26*  --   --   CREATININE 4.80*   < >  --    < > 1.30*  --  1.29* 1.43*  1.42*  --  1.14 1.08  1.09  --   --   CALCIUM 7.5*   < >  --    < > 6.8*  --  7.0* 7.0*  6.9*  --  7.1* 7.1*  7.1*  --   --   MG 2.6*  --  2.5*  --  2.4  --   --  2.6*  --   --  2.5*  --   --   PHOS 3.5   < >  --    < > 2.2*  --  1.8* 2.7  2.7  --  2.5 2.4*  --   --    < > = values in this interval not displayed.   GFR: Estimated Creatinine Clearance: 41.4 mL/min (by C-G formula based on SCr of 1.09 mg/dL). Liver Function Tests: Recent Labs  Lab 03/26/19 0300  03/27/19 0140  03/28/19 0045 03/28/19  1535 03/29/19 0100 03/29/19 1845 03/30/19 0220  AST 52*  --  55*  --  49*  --  59*  --  62*  ALT 22  --  26  --  24  --  29  --  31  ALKPHOS 153*  --  186*  --  159*  --  200*  --  204*  BILITOT 0.3  --  0.4  --  0.1*  --  0.3  --  0.2*  PROT 5.3*  --  6.1*  --  5.5*  --  5.6*  --  5.8*  ALBUMIN 1.8*  1.8*   < > 2.3*   < > 1.9*  1.9* 1.8* 2.0*  1.9* 2.1* 2.1*  2.1*   < > = values in this interval not displayed.   No results for input(s): LIPASE, AMYLASE in the last 168 hours. Recent Labs  Lab 03/24/19 0445  AMMONIA 39*   Coagulation Profile: No results for input(s): INR, PROTIME in the last 168 hours. Cardiac Enzymes: No results for input(s): CKTOTAL, CKMB, CKMBINDEX, TROPONINI in the last 168 hours. BNP (last 3 results) No results for input(s): PROBNP in the last 8760 hours. HbA1C: No results for input(s): HGBA1C in the last 72 hours. CBG: Recent Labs  Lab 03/29/19 2005 03/30/19 0030 03/30/19 0422 03/30/19 0800 03/30/19 1159  GLUCAP 115* 142* 139* 116* 88   Lipid Profile: No results for input(s): CHOL, HDL, LDLCALC, TRIG, CHOLHDL, LDLDIRECT in the last 72 hours. Thyroid Function Tests: Recent Labs    03/30/19 0809  TSH 3.082   Anemia Panel: Recent Labs    03/28/19 0045 03/29/19 0100  FERRITIN 149 122   Urine analysis:    Component Value Date/Time   COLORURINE YELLOW 03/18/2019 0048   APPEARANCEUR CLEAR 03/18/2019 0048   LABSPEC 1.013 03/18/2019 0048   PHURINE 5.0 03/18/2019 0048   GLUCOSEU >=500 (A) 03/18/2019 0048   HGBUR LARGE (A) 03/18/2019 0048   BILIRUBINUR NEGATIVE 03/18/2019 0048   BILIRUBINUR neg 09/05/2017 1111   KETONESUR 5 (A) 03/18/2019 0048   PROTEINUR 100 (A) 03/18/2019 0048   UROBILINOGEN 0.2 09/05/2017 1111   UROBILINOGEN 0.2 07/22/2015 1941   NITRITE NEGATIVE 03/18/2019 0048   LEUKOCYTESUR NEGATIVE 03/18/2019 0048   Sepsis Labs: Invalid input(s): PROCALCITONIN, LACTICIDVEN  Recent Results (from the past 240 hour(s))   Culture, respiratory (non-expectorated)     Status: None   Collection Time: 03/21/19  2:42 PM  Result Value Ref Range Status   Specimen Description   Final    TRACHEAL ASPIRATE Performed at [facility] [address], [place], [place] [address]    Special Requests   Final    NONE Performed at [facility], [place] [address] [place], [place] [address]    Gram Stain   Final    RARE WBC PRESENT,BOTH PMN AND MONONUCLEAR RARE YEAST Performed at [facility], [place] [address] [place], [place] [address]    Culture FEW CANDIDA ALBICANS  Final   Report Status 03/24/2019 FINAL  Final      Radiology Studies: [place] Ekg Site Rite  Result Date: 03/29/2019 If Site Rite image not attached, placement could not be confirmed due to current cardiac rhythm.   Marzetta Board, MD, PhD [facility]  Contact via  [email or web address]  [place] P: [phone]  F: [phone]

## 2019-03-30 NOTE — Progress Notes (Signed)
NAMEZaydn Owen, MRN:  [id number], DOB:  24-Jun-1951, LOS: 12 ADMISSION DATE:  03/22/2019, CONSULTATION DATE: Mar 21, 2019 REFERRING MD: Dr. Thereasa Solo, CHIEF COMPLAINT: Dyspnea  Brief History   68 year old male admitted on May 19 with COVID-19 pneumonia.  Developed acute kidney injury and worsening hypoxemic respiratory failure requiring intubation and mechanical ventilation on Mar 21, 2019.   Past Medical History  Chronic kidney disease Cigarette smoker Hypertension Diabetes mellitus type 2  Significant Hospital Events   May 27 initiation of CVVHD May 30 Hgb down, transfused 1 U PRBC May 31 Hgb down again, transfused 1PRBC again June 1 Oxygenation worse  Consults:  Pulmonary and critical care medicine Nephrology  Procedures:  ET tube May 23>  Right IJ central line May 23> May 30 Left IJ hemodialysis catheter May 27>   Significant Diagnostic Tests:  May 25 CT head no acute intracranial process  Micro Data:  SARS CoV2 5/19 >> positive Urine 5/20 >> insignificant growth Blood 5/19 >> 1 of 2 MRSE (? Contaminant) Blood 5/20 >> Resp 5/23 >>Candida albicans  Antimicrobials:  Azithro 5/19 x 1 Ceftriaxone 5/19 x 1 Cefepime 5/20 >>off vanco 5/20 >> 5/22  Interim history/subjective:   Hgb stable Oxygenation worse More awake and alert today  Objective   Blood pressure 130/62, pulse 84, temperature (!) 97.5 F (36.4 C), temperature source Oral, resp. rate (!) 26, height 5\' 1"  (1.549 m), weight 45.1 kg, SpO2 (!) 89 %. CVP:  [0 mmHg-4 mmHg] 0 mmHg  Vent Mode: PCV FiO2 (%):  [50 %-60 %] 60 % Set Rate:  [32 bmp] 32 bmp PEEP:  [8 cmH20] 8 cmH20 Plateau Pressure:  [30 cmH20-32 cmH20] 30 cmH20   Intake/Output Summary (Last 24 hours) at 03/30/2019 0745 Last data filed at 03/30/2019 0700 Gross per 24 hour  Intake 2299.99 ml  Output 3482 ml  Net -1182.01 ml   Filed Weights   03/28/19 0500 03/29/19 0500 03/30/19 0500  Weight: 45.2 kg 47.2 kg 45.1 kg    Examination:   General:  In bed on vent HENT: NCAT ETT in place PULM: CTA B, vent supported breathing CV: RRR, no mgr GI: BS+, soft, nontender MSK: normal bulk and tone Neuro: awake, confused, doesn't follow commands  May 29 chest x-ray images independently reviewed showing increased bibasilar opacities, tubes and lines in place  Resolved Hospital Problem list     Assessment & Plan:  Acute respiratory failure with hypoxemia due to COVID 19 Continue full mechanical ventilatory support with ARDS protocol Tidal volume target 6 to 8 cc/kg ideal body weight Keep plateau pressure less than 30 degrees At this time PaO2 to FiO2 ratio is less than 150 so we will start prone positioning Change sedation target to RA SS score of -3 to -4 Add midazolam infusion Continue hydromorphone infusion Check LDH, CRP in AM  AKI  Continue CVVHD per renal  Autonomic instability Continue levophed titrated for MAP > 65 Midodrine, continue 10mg  tid  Need for mechanical ventilation, sedation for vent synchrony Periods of unresponsiveness Continue hydromorphone Add midazolam in fusion RASS target -3  Continued hemoglobin drop with shock, concern for either GI bleeding or retroperitoneal bleed F/u Haptoglobin Monitor for hematochezia Consider changing PPI back to daily tomorrow if no bleeding   Thrombocytopenia, agree likely related to famotidine, timecourse and drop doesn't fit well with HITT Continue PPI  Best practice:  Diet: tube feeding Pain/Anxiety/Delirium protocol (if indicated):  VAP protocol (if indicated):  DVT prophylaxis: heparin GI prophylaxis: PPI to  bid Glucose control: per TRH Mobility: bed rest Code Status: Full Family Communication: I updated daughter at length on 5/31 by phone Disposition: remain in ICU  Labs   CBC: Recent Labs  Lab 03/25/19 0230  03/26/19 0300  03/27/19 0140  03/28/19 0045  03/29/19 0100 03/29/19 0415 03/29/19 2042 03/30/19 0220 03/30/19 0451  WBC 15.5*   --  21.0*  --  13.4*  --  12.4*  --  11.7*  --  11.8* 11.2*  --   NEUTROABS 14.7*  --  19.6*  --  12.3*  --  11.1*  --  10.3*  --   --   --   --   HGB 8.8*   < > 7.8*   < > 8.8*   < > 7.5*   < > 7.5* 6.1* 8.9* 8.5* 8.2*  HCT 29.5*   < > 25.4*   < > 27.8*   < > 23.1*   < > 23.4* 18.0* 27.8* 26.3* 24.0*  MCV 95.8  --  93.7  --  92.7  --  92.0  --  91.8  --  92.4 91.6  --   PLT 76*  --  74*  --  75*  --  83*  --  86*  --  82* 87*  --    < > = values in this interval not displayed.    Basic Metabolic Panel: Recent Labs  Lab 03/26/19 0300  03/27/19 0140  03/28/19 0045  03/28/19 1535 03/29/19 0100 03/29/19 0415 03/29/19 1845 03/30/19 0220 03/30/19 0451  NA 146*   < >  --    < > 137   < > 136 137  137 135 135 135  134* 136  K 4.6   < >  --    < > 4.7   < > 5.0 5.0  5.0 4.8 5.0 5.0  5.0 5.0  CL 124*   < >  --    < > 105  --  105 105  105  --  104 102  102  --   CO2 15*   < >  --    < > 25  --  25 25  25   --  25 27  26   --   GLUCOSE 137*   < >  --    < > 147*  --  132* 161*  163*  --  130* 155*  155*  --   BUN 112*   < >  --    < > 26*  --  26* 30*  29*  --  27* 26*  26*  --   CREATININE 4.80*   < >  --    < > 1.30*  --  1.29* 1.43*  1.42*  --  1.14 1.08  1.09  --   CALCIUM 7.5*   < >  --    < > 6.8*  --  7.0* 7.0*  6.9*  --  7.1* 7.1*  7.1*  --   MG 2.6*  --  2.5*  --  2.4  --   --  2.6*  --   --  2.5*  --   PHOS 3.5   < >  --    < > 2.2*  --  1.8* 2.7  2.7  --  2.5 2.4*  --    < > = values in this interval not displayed.   GFR: Estimated Creatinine Clearance: 41.4 mL/min (by C-G formula based on SCr of  1.09 mg/dL). Recent Labs  Lab 03/28/19 0045 03/29/19 0100 03/29/19 2042 03/30/19 0220  WBC 12.4* 11.7* 11.8* 11.2*    Liver Function Tests: Recent Labs  Lab 03/26/19 0300  03/27/19 0140  03/28/19 0045 03/28/19 1535 03/29/19 0100 03/29/19 1845 03/30/19 0220  AST 52*  --  55*  --  49*  --  59*  --  62*  ALT 22  --  26  --  24  --  29  --  31  ALKPHOS  153*  --  186*  --  159*  --  200*  --  204*  BILITOT 0.3  --  0.4  --  0.1*  --  0.3  --  0.2*  PROT 5.3*  --  6.1*  --  5.5*  --  5.6*  --  5.8*  ALBUMIN 1.8*  1.8*   < > 2.3*   < > 1.9*  1.9* 1.8* 2.0*  1.9* 2.1* 2.1*  2.1*   < > = values in this interval not displayed.   No results for input(s): LIPASE, AMYLASE in the last 168 hours. Recent Labs  Lab 03/24/19 0445  AMMONIA 39*    ABG    Component Value Date/Time   PHART 7.341 (L) 03/30/2019 0451   PCO2ART 44.7 03/30/2019 0451   PO2ART 48.0 (L) 03/30/2019 0451   HCO3 24.4 03/30/2019 0451   TCO2 26 03/30/2019 0451   ACIDBASEDEF 2.0 03/30/2019 0451   O2SAT 82.0 03/30/2019 0451     Coagulation Profile: No results for input(s): INR, PROTIME in the last 168 hours.  Cardiac Enzymes: No results for input(s): CKTOTAL, CKMB, CKMBINDEX, TROPONINI in the last 168 hours.  HbA1C: Hgb A1c MFr Bld  Date/Time Value Ref Range Status  03/18/2019 10:45 AM 14.9 (H) 4.8 - 5.6 % Final    Comment:    (NOTE) Pre diabetes:          5.7%-6.4% Diabetes:              >6.4% Glycemic control for   <7.0% adults with diabetes   03/18/2019 01:18 AM 14.9 (H) 4.8 - 5.6 % Final    Comment:    (NOTE) Pre diabetes:          5.7%-6.4% Diabetes:              >6.4% Glycemic control for   <7.0% adults with diabetes     CBG: Recent Labs  Lab 03/29/19 1207 03/29/19 1814 03/29/19 2005 03/30/19 0030 03/30/19 0422  GLUCAP 106* 113* 115* 142* 139*      Critical care time: 35 minutes      Roselie Awkward, MD [facility] Pager: [phone] Cell: [phone] If no response, call [phone]

## 2019-03-30 NOTE — Progress Notes (Signed)
Spoke with patient's daughter and answered all questions. Patient's daughter states that physician called and gave an update as well. Patient's daughter states that she would like to video chat tomorrow.

## 2019-03-30 NOTE — Progress Notes (Signed)
2 RT and RN turned pt's head to the left at this time.  No complications and all VS stable throughout. RT will continue to monitor.

## 2019-03-30 NOTE — Progress Notes (Signed)
Patient ID: Brian Owen, male   DOB: Aug 03, 1951, 68 y.o.   MRN: [id number]  [facility] Progress Note   Assessment/ Plan:   1 A on CKD 3/4 -baseline creat 2.0.  CRRT started 5/27 via L IJ cath. Anuric and will continue CRRT at this time with some modifications of dialysate bath/rate.    2  COVID + viral PNA - per CCM  3  IDDM  4  Tobacco smoker   Subjective:   Some agitation noted overnight   Objective:   BP (!) 84/47   Pulse 69   Temp (!) 96.2 F (35.7 C) (Axillary)   Resp (!) 32   Ht 5\' 1"  (1.549 m)   Wt 45.1 kg   SpO2 96%   BMI 18.80 kg/m   Intake/Output Summary (Last 24 hours) at 03/30/2019 1255 Last data filed at 03/30/2019 1200 Gross per 24 hour  Intake 2396.48 ml  Output 3371 ml  Net -974.52 ml   Weight change: -2.041 kg  Physical Exam: Patient not examined--COVID-19 caveat; used findings from RN/RT exam  Imaging: [place] Ekg Site Rite  Result Date: 03/29/2019 If Site Rite image not attached, placement could not be confirmed due to current cardiac rhythm.  Labs: BMET Recent Labs  Lab 03/27/19 0145  03/27/19 1530 03/28/19 0045  03/28/19 1535 03/29/19 0100 03/29/19 0415 03/29/19 1845 03/30/19 0220 03/30/19 0451 03/30/19 1050  NA 141   < > 138 137   < > 136 137  137 135 135 135  134* 136 136  K 4.9   < > 4.4 4.7   < > 5.0 5.0  5.0 4.8 5.0 5.0  5.0 5.0 4.8  CL 109  --  108 105  --  105 105  105  --  104 102  102  --   --   CO2 24  --  25 25  --  25 25  25   --  25 27  26   --   --   GLUCOSE 139*  --  80 147*  --  132* 161*  163*  --  130* 155*  155*  --   --   BUN 46*  --  33* 26*  --  26* 30*  29*  --  27* 26*  26*  --   --   CREATININE 2.12*  --  1.47* 1.30*  --  1.29* 1.43*  1.42*  --  1.14 1.08  1.09  --   --   CALCIUM 7.1*  --  6.9* 6.8*  --  7.0* 7.0*  6.9*  --  7.1* 7.1*  7.1*  --   --   PHOS 2.5  --  2.3* 2.2*  --  1.8* 2.7  2.7  --  2.5 2.4*  --   --    < > = values in this interval not displayed.   CBC Recent  Labs  Lab 03/26/19 0300  03/27/19 0140  03/28/19 0045  03/29/19 0100  03/29/19 2042 03/30/19 0220 03/30/19 0451 03/30/19 1050  WBC 21.0*  --  13.4*  --  12.4*  --  11.7*  --  11.8* 11.2*  --   --   NEUTROABS 19.6*  --  12.3*  --  11.1*  --  10.3*  --   --   --   --   --   HGB 7.8*   < > 8.8*   < > 7.5*   < > 7.5*   < > 8.9* 8.5*  8.2* 8.5*  HCT 25.4*   < > 27.8*   < > 23.1*   < > 23.4*   < > 27.8* 26.3* 24.0* 25.0*  MCV 93.7  --  92.7  --  92.0  --  91.8  --  92.4 91.6  --   --   PLT 74*  --  75*  --  83*  --  86*  --  82* 87*  --   --    < > = values in this interval not displayed.    Medications:    . B-complex with vitamin C  1 tablet Per Tube Daily  . chlorhexidine  15 mL Mouth/Throat BID  . Chlorhexidine Gluconate Cloth  6 each Topical Q0600  . Chlorhexidine Gluconate Cloth  6 each Topical Daily  . heparin injection (subcutaneous)  5,000 Units Subcutaneous Q8H  . insulin aspart  0-15 Units Subcutaneous Q4H  . insulin detemir  15 Units Subcutaneous BID  . mouth rinse  15 mL Mouth Rinse 10 times per day  . midodrine  10 mg Per Tube Q8H  . pantoprazole (PROTONIX) IV  40 mg Intravenous Q12H  . pneumococcal 23 valent vaccine  0.5 mL Intramuscular Tomorrow-1000  . sodium chloride flush  10-40 mL Intracatheter Q12H  . Thrombi-Pad  1 each Topical Once   Elmarie Shiley, MD 03/30/2019, 12:55 PM

## 2019-03-30 NOTE — Progress Notes (Signed)
[facility]  I called his daughter and updated her  Roselie Awkward, MD [facility] Pager: [phone] Cell: [phone] If no response, call [phone]

## 2019-03-30 DEATH — deceased

## 2019-03-31 LAB — CBC
HCT: 22 % — ABNORMAL LOW (ref 39.0–52.0)
Hemoglobin: 7.1 g/dL — ABNORMAL LOW (ref 13.0–17.0)
MCH: 30.7 pg (ref 26.0–34.0)
MCHC: 32.3 g/dL (ref 30.0–36.0)
MCV: 95.2 fL (ref 80.0–100.0)
Platelets: 74 10*3/uL — ABNORMAL LOW (ref 150–400)
RBC: 2.31 MIL/uL — ABNORMAL LOW (ref 4.22–5.81)
RDW: 15.8 % — ABNORMAL HIGH (ref 11.5–15.5)
WBC: 12.9 10*3/uL — ABNORMAL HIGH (ref 4.0–10.5)
nRBC: 6 % — ABNORMAL HIGH (ref 0.0–0.2)

## 2019-03-31 LAB — RENAL FUNCTION PANEL
Albumin: 2 g/dL — ABNORMAL LOW (ref 3.5–5.0)
Albumin: 2 g/dL — ABNORMAL LOW (ref 3.5–5.0)
Anion gap: 7 (ref 5–15)
Anion gap: 10 (ref 5–15)
BUN: 17 mg/dL (ref 8–23)
BUN: 16 mg/dL (ref 8–23)
CO2: 25 mmol/L (ref 22–32)
CO2: 21 mmol/L — ABNORMAL LOW (ref 22–32)
Calcium: 7 mg/dL — ABNORMAL LOW (ref 8.9–10.3)
Calcium: 6.9 mg/dL — ABNORMAL LOW (ref 8.9–10.3)
Chloride: 103 mmol/L (ref 98–111)
Chloride: 103 mmol/L (ref 98–111)
Creatinine, Ser: 0.98 mg/dL (ref 0.61–1.24)
Creatinine, Ser: 0.96 mg/dL (ref 0.61–1.24)
GFR calc Af Amer: >60 mL/min (ref >60)
GFR calc Af Amer: >60 mL/min (ref >60)
GFR calc non Af Amer: >60 mL/min (ref >60)
GFR calc non Af Amer: >60 mL/min (ref >60)
Glucose, Bld: 80 mg/dL (ref 70–99)
Glucose, Bld: 145 mg/dL — ABNORMAL HIGH (ref 70–99)
Phosphorus: 2.8 mg/dL (ref 2.5–4.6)
Phosphorus: 3 mg/dL (ref 2.5–4.6)
Potassium: 4.2 mmol/L (ref 3.5–5.1)
Potassium: 4.6 mmol/L (ref 3.5–5.1)
Sodium: 135 mmol/L (ref 135–145)
Sodium: 134 mmol/L — ABNORMAL LOW (ref 135–145)

## 2019-03-31 LAB — POCT I-STAT 7, (LYTES, BLD GAS, ICA,H+H)
Acid-base deficit: 4 mmol/L — ABNORMAL HIGH (ref 0.0–2.0)
Bicarbonate: 22.6 mmol/L (ref 20.0–28.0)
Calcium, Ion: 1.09 mmol/L — ABNORMAL LOW (ref 1.15–1.40)
HCT: 22 % — ABNORMAL LOW (ref 39.0–52.0)
Hemoglobin: 7.5 g/dL — ABNORMAL LOW (ref 13.0–17.0)
O2 Saturation: 90 %
Patient temperature: 95.7
Potassium: 4.4 mmol/L (ref 3.5–5.1)
Sodium: 137 mmol/L (ref 135–145)
TCO2: 24 mmol/L (ref 22–32)
pCO2 arterial: 46.8 mmHg (ref 32.0–48.0)
pH, Arterial: 7.284 — ABNORMAL LOW (ref 7.350–7.450)
pO2, Arterial: 60 mmHg — ABNORMAL LOW (ref 83.0–108.0)

## 2019-03-31 LAB — LACTATE DEHYDROGENASE: LDH: 382 U/L — ABNORMAL HIGH (ref 98–192)

## 2019-03-31 LAB — HEPATIC FUNCTION PANEL
ALT: 35 U/L (ref 0–44)
AST: 64 U/L — ABNORMAL HIGH (ref 15–41)
Albumin: 2.1 g/dL — ABNORMAL LOW (ref 3.5–5.0)
Alkaline Phosphatase: 195 U/L — ABNORMAL HIGH (ref 38–126)
Bilirubin, Direct: 0.1 mg/dL (ref 0.0–0.2)
Indirect Bilirubin: 0.3 mg/dL (ref 0.3–0.9)
Total Bilirubin: 0.4 mg/dL (ref 0.3–1.2)
Total Protein: 5.8 g/dL — ABNORMAL LOW (ref 6.5–8.1)

## 2019-03-31 LAB — TROPONIN I: Troponin I: 0.04 ng/mL (ref <0.03)

## 2019-03-31 LAB — MAGNESIUM: Magnesium: 2.6 mg/dL — ABNORMAL HIGH (ref 1.7–2.4)

## 2019-03-31 LAB — GLUCOSE, CAPILLARY
Glucose-Capillary: 101 mg/dL — ABNORMAL HIGH (ref 70–99)
Glucose-Capillary: 107 mg/dL — ABNORMAL HIGH (ref 70–99)
Glucose-Capillary: 129 mg/dL — ABNORMAL HIGH (ref 70–99)
Glucose-Capillary: 171 mg/dL — ABNORMAL HIGH (ref 70–99)
Glucose-Capillary: 35 mg/dL — CL (ref 70–99)
Glucose-Capillary: 46 mg/dL — ABNORMAL LOW (ref 70–99)
Glucose-Capillary: 55 mg/dL — ABNORMAL LOW (ref 70–99)
Glucose-Capillary: 59 mg/dL — ABNORMAL LOW (ref 70–99)
Glucose-Capillary: 94 mg/dL (ref 70–99)
Glucose-Capillary: 97 mg/dL (ref 70–99)

## 2019-03-31 LAB — HAPTOGLOBIN: Haptoglobin: 129 mg/dL (ref 32–363)

## 2019-03-31 LAB — FIBRINOGEN: Fibrinogen: 561 mg/dL — ABNORMAL HIGH (ref 210–475)

## 2019-03-31 LAB — APTT: aPTT: >200 seconds (ref 24–36)

## 2019-03-31 LAB — C-REACTIVE PROTEIN: CRP: 10.5 mg/dL — ABNORMAL HIGH (ref <1.0)

## 2019-03-31 MED ORDER — SODIUM CHLORIDE 0.9 % IV SOLN
2.0000 g | Freq: Once | INTRAVENOUS | Status: AC
  Administered 2019-03-31: 11:00:00 2 g via INTRAVENOUS
  Filled 2019-03-31: qty 2

## 2019-03-31 MED ORDER — SODIUM CHLORIDE 0.9 % IV SOLN
2.0000 g | Freq: Two times a day (BID) | INTRAVENOUS | Status: DC
  Filled 2019-03-31: qty 2

## 2019-03-31 MED ORDER — PRISMASOL BGK 4/2.5 32-4-2.5 MEQ/L IV SOLN: INTRAVENOUS | Status: DC

## 2019-03-31 MED ORDER — PHENYLEPHRINE HCL-NACL 10-0.9 MG/250ML-% IV SOLN
0.0000 ug/min | INTRAVENOUS | Status: DC
  Administered 2019-03-31: 03:00:00 20 ug/min via INTRAVENOUS
  Filled 2019-03-31: qty 250

## 2019-03-31 MED ORDER — VANCOMYCIN HCL IN DEXTROSE 1-5 GM/200ML-% IV SOLN
1000.0000 mg | Freq: Once | INTRAVENOUS | Status: DC
  Filled 2019-03-31: qty 200

## 2019-03-31 MED ORDER — PANTOPRAZOLE SODIUM 40 MG PO PACK: 40.0000 mg | PACK | Freq: Every day | ORAL | Status: DC

## 2019-03-31 MED ORDER — DEXTROSE 50 % IV SOLN
INTRAVENOUS | Status: AC
  Administered 2019-03-31: 09:00:00 35 mL
  Filled 2019-03-31: qty 50

## 2019-03-31 MED ORDER — SODIUM CHLORIDE 0.9 % IV BOLUS
500.0000 mL | Freq: Once | INTRAVENOUS | Status: AC
  Administered 2019-03-31: 02:00:00 500 mL via INTRAVENOUS

## 2019-03-31 MED ORDER — SODIUM CHLORIDE 0.9 % IV SOLN: 2.0000 g | Freq: Once | INTRAVENOUS | Status: DC

## 2019-03-31 MED ORDER — VASOPRESSIN 20 UNIT/ML IV SOLN
0.0300 [IU]/min | INTRAVENOUS | Status: DC
  Administered 2019-03-31: 10:00:00 0.03 [IU]/min via INTRAVENOUS
  Filled 2019-03-31 (×2): qty 2

## 2019-03-31 MED ORDER — POLYETHYLENE GLYCOL 3350 17 G PO PACK
17.0000 g | PACK | Freq: Every day | ORAL | Status: DC
  Administered 2019-03-31: 17 g via ORAL
  Filled 2019-03-31: qty 1

## 2019-03-31 MED ORDER — ARTIFICIAL TEARS OPHTHALMIC OINT: TOPICAL_OINTMENT | OPHTHALMIC | Status: DC | PRN

## 2019-03-31 MED ORDER — DEXTROSE 50 % IV SOLN
INTRAVENOUS | Status: AC
  Administered 2019-03-31: 25 mL
  Filled 2019-03-31: qty 50

## 2019-03-31 MED ORDER — INSULIN DETEMIR 100 UNIT/ML ~~LOC~~ SOLN
5.0000 [IU] | Freq: Two times a day (BID) | SUBCUTANEOUS | Status: DC
  Filled 2019-03-31 (×2): qty 0.05

## 2019-03-31 MED ORDER — VANCOMYCIN HCL IN DEXTROSE 1-5 GM/200ML-% IV SOLN
1000.0000 mg | Freq: Once | INTRAVENOUS | Status: AC
  Administered 2019-03-31: 1000 mg via INTRAVENOUS
  Filled 2019-03-31: qty 200

## 2019-03-31 MED ORDER — DEXTROSE 50 % IV SOLN
25.0000 mL | Freq: Once | INTRAVENOUS | Status: AC
  Administered 2019-03-31: 25 mL via INTRAVENOUS

## 2019-03-31 MED ORDER — SENNOSIDES-DOCUSATE SODIUM 8.6-50 MG PO TABS
2.0000 | ORAL_TABLET | Freq: Two times a day (BID) | ORAL | Status: DC
  Administered 2019-03-31: 14:00:00 2 via ORAL
  Filled 2019-03-31 (×2): qty 2

## 2019-03-31 MED ORDER — PHENYLEPHRINE HCL-NACL 40-0.9 MG/250ML-% IV SOLN
0.0000 ug/min | INTRAVENOUS | Status: DC
  Administered 2019-03-31: 05:00:00 140 ug/min via INTRAVENOUS
  Administered 2019-03-31: 165 ug/min via INTRAVENOUS
  Administered 2019-03-31: 230 ug/min via INTRAVENOUS
  Filled 2019-03-31: qty 500
  Filled 2019-03-31 (×2): qty 250

## 2019-03-31 MED ORDER — VANCOMYCIN VARIABLE DOSE PER UNSTABLE RENAL FUNCTION (PHARMACIST DOSING): Status: DC

## 2019-03-31 NOTE — Progress Notes (Signed)
Assisted tele visit to patient with daughter.  Krystyna Cleckley Anderson, RN   

## 2019-03-31 NOTE — Progress Notes (Signed)
PROGRESS NOTE  Brian Owen ERD:[id number] DOB: [birth year] DOA: 03/13/2019 PCP: Alfonse Spruce, FNP   LOS: 13 days   Brief Narrative / Interim history: 68 year old male with history of hypertension, diabetes, chronic kidney disease stage III, multiple prior admissions for orthostatic hypotension related to autonomic dysfunction-noncompliant with Midodrine, who was admitted to the hospital on 03/10/2019 with fever, worsening renal failure, as well as multifocal pneumonia on chest x-ray.  He was found to be Covid positive  Significant Events: 5/19 admit 5/22 saturations declining 5/23 transferred to ICU - intubated - R IJ CVL  COVID-19 specific Treatment: Actemra 5/21 & 5/23 Steroids 5/21 > 5/23  Subjective / Interval events: -worsening hypoxemia, hypotension requiring a 2nd pressor, hypothermic.  Assessment & Plan: Principal Problem:   Acute respiratory disease due to COVID-19 virus Active Problems:   Type 2 diabetes mellitus with hyperglycemia, with long-term current use of insulin (HCC)   AKI (acute kidney injury) ([place])   Severe sepsis (HCC)   Acute respiratory failure with hypoxia (HCC)   Pressure injury of skin   Principal Problem Acute Hypoxic Respiratory Failure due to Covid-19 Viral Illness/ARDS/shock -full vent support, worsening overnight   Vent Mode: PCV FiO2 (%):  [50 %-80 %] 80 % Set Rate:  [32 bmp] 32 bmp PEEP:  [8 cmH20] 8 cmH20 Plateau Pressure:  [25 cmH20-30 cmH20] 30 cmH20  -Not a candidate for Remdesivir due to renal failure -Has received Actemra x2 -Has received Solu-Medrol x3 days up until 5/23 -hypotensive overnight, requiring a second pressor. Also hypoglycemic and hypothermic -cortisol appropriate at 17, TSH normal -Chest x-ray yesterday with slight interval increase in opacities,?  Chest x-ray findings as well as hypotension, hypothermia related to developing infection, will place on broad-spectrum antibiotics with Vanco cefepime for H CAP  coverage.  Sputum cultures obtained  COVID-19 Labs  Recent Labs    03/29/19 0100 03/29/19 1845 04/11/2019 0150  DDIMER 4.99*  --   --   FERRITIN 122  --   --   LDH  --  420* 382*  CRP 14.4*  --  10.5*    Lab Results  Component Value Date   SARSCOV2NAA POSITIVE (A) 03/24/2019     Active Problems: Anemia (question acute blood loss) -Patient's hemoglobin has been around 7.5-8.8 since admission, but dropped to 6.5 on 5/30. Received a total of 2U pRBC with adequate improvement however now trending down again. No obvious bleeding.  -Fecal occult pending, haptoglobin still pending.  Possible DIC given elevated APTT however fibrinogen elevated, but difficult to interpret in the setting of COVID-19.  Smear review for schistocytes pending -Continue to monitor and transfuse for hemoglobin less than 7  Thrombocytopenia  -less likely HIT given slow decline  Autonomic dysfunction /chronic orthostatic hypotension -Cont midodrine per tube, on pressors  Acute kidney injury on chronic kidney disease stage III -Baseline creatinine around 2.0, urine output was minimal, did not respond to Lasix and patient was started on CRRT 5/28 -Nephrology following  Type 2 diabetes mellitus  -decrease Lantus due to hypoglycemia  Mildly elevated troponin -Due to demand  Positive blood culture - Coag neg Staph + Diptheroids (1 of 2 blood cx) -Consistent with contamination  Patient remains critically ill and progressively declining despite full medical support.  PCCM to discuss with family regarding DNR which is appropriate in this case  Scheduled Meds: . B-complex with vitamin C  1 tablet Per Tube Daily  . chlorhexidine  15 mL Mouth/Throat BID  . Chlorhexidine Gluconate Cloth  6 each  Topical Q0600  . Chlorhexidine Gluconate Cloth  6 each Topical Daily  . heparin injection (subcutaneous)  5,000 Units Subcutaneous Q8H  . insulin aspart  0-15 Units Subcutaneous Q4H  . insulin detemir  5 Units  Subcutaneous BID  . mouth rinse  15 mL Mouth Rinse 10 times per day  . midodrine  10 mg Per Tube Q8H  . pantoprazole (PROTONIX) IV  40 mg Intravenous Q12H  . pneumococcal 23 valent vaccine  0.5 mL Intramuscular Tomorrow-1000  . sodium chloride flush  10-40 mL Intracatheter Q12H  . Thrombi-Pad  1 each Topical Once   Continuous Infusions: .  prismasol BGK 4/2.5 400 mL/hr at 03/30/19 2030  .  prismasol BGK 4/2.5 200 mL/hr at 03/30/19 1004  . sodium chloride 10 mL/hr at 04/08/2019 0200  . feeding supplement (VITAL AF 1.2 CAL) 20 mL/hr at 04/21/2019 0000  . heparin 10,000 units/ 20 mL infusion syringe 1,000 Units/hr (04/16/2019 0600)  . HYDROmorphone 0.5 mg/hr (04/13/2019 0600)  . midazolam 0.5 mg/hr (04/16/2019 0600)  . norepinephrine (LEVOPHED) Adult infusion 40 mcg/min (04/03/2019 0600)  . phenylephrine (NEO-SYNEPHRINE) Adult infusion Stopped (04/17/2019 0429)  . phenylephrine (NEO-SYNEPHRINE) Adult infusion 190 mcg/min (04/08/2019 0600)  . prismasol bgk dialysis solution with potassium 1,500 mL/hr at 04/09/2019 0444   PRN Meds:.sodium chloride, acetaminophen (TYLENOL) oral liquid 160 mg/5 mL, acetaminophen, artificial tears, heparin, heparin, HYDROmorphone, lip balm, midazolam, sodium chloride flush  DVT prophylaxis: heparin Code Status: Full code Family Communication: PCCM updating family  Disposition Plan: TBD  Consultants:   PCCM   Procedures:   None   Antimicrobials: Azithro 5/19 x 1 Ceftriaxone 5/19 x 1 Cefepime 5/20 >>off vanco 5/20 >> 5/22   Objective: Vitals:   04/22/2019 0515 04/09/2019 0545 04/18/2019 0600 04/15/2019 0615  BP: 114/64 111/64 119/64 110/61  Pulse: 98 93 93 91  Resp: (!) 23 (!) 30 (!) 27 (!) 26  Temp: (!) 95.7 F (35.4 C) (!) 95.9 F (35.5 C) (!) 95.7 F (35.4 C) (!) 95.4 F (35.2 C)  TempSrc:      SpO2: 96% 98% 99% 96%  Weight:      Height:        Intake/Output Summary (Last 24 hours) at 04/22/2019 0645 Last data filed at 04/11/2019 0602 Gross per 24 hour   Intake 2587.84 ml  Output 1253 ml  Net 1334.84 ml   Filed Weights   03/29/19 0500 03/30/19 0500 04/05/2019 0500  Weight: 47.2 kg 45.1 kg 50.8 kg    Examination:  Constitutional: Intubated, eyes are open but does not respond to stimuli Eyes: No scleral icterus ENMT: Dry mucous membranes Neck: Normal, supple Respiratory: Distant breath sounds, no wheezing, no crackles Cardiovascular: Regular rate and rhythm, no murmurs appreciated.  No edema Abdomen: Soft, nondistended, positive bowel sounds Musculoskeletal: no clubbing / cyanosis.  Skin: No new rashes Neurologic: Sedated on vent   Data Reviewed: I have independently reviewed following labs and imaging studies   CBC: Recent Labs  Lab 03/25/19 0230  03/26/19 0300  03/27/19 0140  03/28/19 0045  03/29/19 0100  03/29/19 2042 03/30/19 0220 03/30/19 0451 03/30/19 1050 03/30/19 1420 04/06/2019 0620  WBC 15.5*  --  21.0*  --  13.4*  --  12.4*  --  11.7*  --  11.8* 11.2*  --   --  11.7*  --   NEUTROABS 14.7*  --  19.6*  --  12.3*  --  11.1*  --  10.3*  --   --   --   --   --   --   --  HGB 8.8*   < > 7.8*   < > 8.8*   < > 7.5*   < > 7.5*   < > 8.9* 8.5* 8.2* 8.5* 7.9* 7.5*  HCT 29.5*   < > 25.4*   < > 27.8*   < > 23.1*   < > 23.4*   < > 27.8* 26.3* 24.0* 25.0* 23.9* 22.0*  MCV 95.8  --  93.7  --  92.7  --  92.0  --  91.8  --  92.4 91.6  --   --  93.4  --   PLT 76*  --  74*  --  75*  --  83*  --  86*  --  82* 87*  --   --  81*  --    < > = values in this interval not displayed.   Basic Metabolic Panel: Recent Labs  Lab 03/27/19 0140  03/28/19 0045  03/29/19 0100  03/29/19 1845 03/30/19 0220 03/30/19 0451 03/30/19 1050 03/30/19 1515 04/27/2019 0150 04/27/2019 0620  NA  --    < > 137   < > 137  137   < > 135 135  134* 136 136 138 135 137  K  --    < > 4.7   < > 5.0  5.0   < > 5.0 5.0  5.0 5.0 4.8 4.2 4.2 4.4  CL  --    < > 105   < > 105  105  --  104 102  102  --   --  105 103  --   CO2  --    < > 25   < > 25  25  --   25 27  26   --   --  26 25  --   GLUCOSE  --    < > 147*   < > 161*  163*  --  130* 155*  155*  --   --  49* 80  --   BUN  --    < > 26*   < > 30*  29*  --  27* 26*  26*  --   --  24* 17  --   CREATININE  --    < > 1.30*   < > 1.43*  1.42*  --  1.14 1.08  1.09  --   --  1.07 0.98  --   CALCIUM  --    < > 6.8*   < > 7.0*  6.9*  --  7.1* 7.1*  7.1*  --   --  7.3* 7.0*  --   MG 2.5*  --  2.4  --  2.6*  --   --  2.5*  --   --   --  2.6*  --   PHOS  --    < > 2.2*   < > 2.7  2.7  --  2.5 2.4*  --   --  2.2* 2.8  --    < > = values in this interval not displayed.   GFR: Estimated Creatinine Clearance: 51.8 mL/min (by C-G formula based on SCr of 0.98 mg/dL). Liver Function Tests: Recent Labs  Lab 03/27/19 0140  03/28/19 0045  03/29/19 0100 03/29/19 1845 03/30/19 0220 03/30/19 1515 04/26/2019 0150  AST 55*  --  49*  --  59*  --  62*  --  64*  ALT 26  --  24  --  29  --  31  --  35  ALKPHOS 186*  --  159*  --  200*  --  204*  --  195*  BILITOT 0.4  --  0.1*  --  0.3  --  0.2*  --  0.4  PROT 6.1*  --  5.5*  --  5.6*  --  5.8*  --  5.8*  ALBUMIN 2.3*   < > 1.9*  1.9*   < > 2.0*  1.9* 2.1* 2.1*  2.1* 2.2* 2.1*  2.0*   < > = values in this interval not displayed.   No results for input(s): LIPASE, AMYLASE in the last 168 hours. No results for input(s): AMMONIA in the last 168 hours. Coagulation Profile: No results for input(s): INR, PROTIME in the last 168 hours. Cardiac Enzymes: No results for input(s): CKTOTAL, CKMB, CKMBINDEX, TROPONINI in the last 168 hours. BNP (last 3 results) No results for input(s): PROBNP in the last 8760 hours. HbA1C: No results for input(s): HGBA1C in the last 72 hours. CBG: Recent Labs  Lab 03/30/19 2015 04/06/2019 0009 03/30/2019 0033 03/30/2019 0413 04/02/2019 0435  GLUCAP 129* 59* 107* 55* 101*   Lipid Profile: No results for input(s): CHOL, HDL, LDLCALC, TRIG, CHOLHDL, LDLDIRECT in the last 72 hours. Thyroid Function Tests: Recent Labs     03/30/19 0809  TSH 3.082   Anemia Panel: Recent Labs    03/29/19 0100  FERRITIN 122   Urine analysis:    Component Value Date/Time   COLORURINE YELLOW 03/18/2019 0048   APPEARANCEUR CLEAR 03/18/2019 0048   LABSPEC 1.013 03/18/2019 0048   PHURINE 5.0 03/18/2019 0048   GLUCOSEU >=500 (A) 03/18/2019 0048   HGBUR LARGE (A) 03/18/2019 0048   BILIRUBINUR NEGATIVE 03/18/2019 0048   BILIRUBINUR neg 09/05/2017 1111   KETONESUR 5 (A) 03/18/2019 0048   PROTEINUR 100 (A) 03/18/2019 0048   UROBILINOGEN 0.2 09/05/2017 1111   UROBILINOGEN 0.2 07/22/2015 1941   NITRITE NEGATIVE 03/18/2019 0048   LEUKOCYTESUR NEGATIVE 03/18/2019 0048   Sepsis Labs: Invalid input(s): PROCALCITONIN, LACTICIDVEN  Recent Results (from the past 240 hour(s))  Culture, respiratory (non-expectorated)     Status: None   Collection Time: 03/21/19  2:42 PM  Result Value Ref Range Status   Specimen Description   Final    TRACHEAL ASPIRATE Performed at [facility] [address], [place], [place] [address]    Special Requests   Final    NONE Performed at [facility], [place] [address] [place], [place] [address]    Gram Stain   Final    RARE WBC PRESENT,BOTH PMN AND MONONUCLEAR RARE YEAST Performed at [facility], [place] [address], [place], [place] [address]    Culture FEW CANDIDA ALBICANS  Final   Report Status 03/24/2019 FINAL  Final      Radiology Studies: Dg Chest Port 1 View  Result Date: 03/30/2019 CLINICAL DATA:  Patient with hypotension EXAM: PORTABLE CHEST 1 VIEW COMPARISON:  Chest radiograph 03/27/2019 FINDINGS: ET tube terminates in the distal trachea. Enteric tube courses inferior to the diaphragm. Interval removal right IJ central venous catheter. Left IJ dual lumen central venous catheter tip projects over the superior vena cava. Right upper extremity PICC line tip projects over the right atrium. Stable cardiomegaly. Persistent diffuse bilateral  heterogeneous pulmonary opacities, slightly increased within the left upper lung and right lung base. No pleural effusion or pneumothorax. IMPRESSION: Slight interval increase in opacities within the left upper lung and right lung base. Electronically Signed   By: Dian Situ  Rosana Hoes M.D.   On: 03/30/2019 13:27   [place] Ekg Site Rite  Result Date: 03/29/2019 If Site Rite image not attached, placement could not be confirmed due to current cardiac rhythm.   Marzetta Board, MD, PhD [facility]  Contact via  [email or web address]  [place] P: [phone]  F: [phone]

## 2019-03-31 NOTE — Progress Notes (Signed)
Pharmacy Antibiotic Note  Brian Owen is a 68 y.o. male admitted on 03/23/2019 with COVID hypoxic respiratory failure.  He previously completed 8 days of cefepime and is on CRRT.  Pharmacy has been consulted for Cefepime and vancomycin dosing for HCAP.    Plan:  Cefepime 2g IV q12h  Vancomycin 1000 mg IV x1 with further dosing pending serum concentrations.  Follow up renal function/CRRT, culture results, and clinical course.   Height: 5\' 1"  (154.9 cm) Weight: 112 lb (50.8 kg) IBW/kg (Calculated) : 52.3  Temp (24hrs), Avg:93.8 F (34.3 C), Min:88.5 F (31.4 C), Max:95.9 F (35.5 C)  Recent Labs  Lab 03/28/19 0045  03/29/19 0100 03/29/19 1845 03/29/19 2042 03/30/19 0220 03/30/19 1420 03/30/19 1515 04-21-2019 0150  WBC 12.4*  --  11.7*  --  11.8* 11.2* 11.7*  --   --   CREATININE 1.30*   < > 1.43*  1.42* 1.14  --  1.08  1.09  --  1.07 0.98   < > = values in this interval not displayed.    Estimated Creatinine Clearance: 51.8 mL/min (by C-G formula based on SCr of 0.98 mg/dL).    No Known Allergies  Antimicrobials this admission: 5/19 Azithromycin x1 5/19 Ceftriaxone x1 5/20 Vitamin C  >> 5/23 5/20 zinc sulfate >> 5/23 5/20 Vancomycin >> 5/22, resumed 6/2 >>  5/20 Cefepime >> 5/27, resumed 6/2 >>  5/21 Actemra x1 5/23 Actemra x1  Dose adjustments this admission:  Microbiology results: 5/19 SARS Coronavirus 2: positive 5/19 BCx: 1/4 bottles CNS, 1/4 bottles Diphtheroids (no BCID done) 5/20 UCx: <10k 5/20 MRSA PCR: neg 5/20 blood:  negF 5/20 Strep pneumo Ur Ag: positive 5/23 TA: few c.albicans 6/2 respiratory:    Thank you for allowing pharmacy to be a part of this patient's care.  Gretta Arab PharmD, BCPS Clinical Pharmacist Clinical pharmacist phone 7am- 5pm: [phone] 04-21-19 9:55 AM

## 2019-03-31 NOTE — Progress Notes (Signed)
Patient ID: Brian Owen, male   DOB: June 19, 1951, 68 y.o.   MRN: [id number]  [facility] Progress Note   Assessment/ Plan:   1. Acute kidney injury on Chronic kidney disease stage 3/4 -baseline creat 2.0.  CRRT started 5/27 via L IJ cath. He remains anuric and without any evident renal recovery--will continue CRRT (net even) at this time recognizing that overall prognosis is dismal.    2  COVID-19 viral PNA/HCAP - status post Actemra and corticosteroids (Remdesivir use precluded by AKI), getting prone ventilation strategy per CCM-- unfortunately continues to show lack of progress and possible decline.  3.  IDDM: glycemic management per ICU protocol  4. Thrombocytopenia: suspected to be from myelosuppression of acute/critical illness with famotidine use, no bleeding diathesis.  5. Shock: with evidence of autonomic dysfunction and on pressors.      Subjective:   Increased pressor needs noted overnight with respiratory status unchanged vs declined   Objective:   BP (!) 111/56   Pulse 81   Temp (!) 95.5 F (35.3 C)   Resp 18   Ht 5\' 1"  (1.549 m)   Wt 50.8 kg   SpO2 95%   BMI 21.16 kg/m   Intake/Output Summary (Last 24 hours) at 04/13/2019 1226 Last data filed at 04/02/2019 1200 Gross per 24 hour  Intake 3072.8 ml  Output 1593 ml  Net 1479.8 ml   Weight change: 5.67 kg  Physical Exam: Patient not examined--COVID-19 caveat; used findings from RN/RT/MD exam  Imaging: Dg Chest Port 1 View  Result Date: 03/30/2019 CLINICAL DATA:  Patient with hypotension EXAM: PORTABLE CHEST 1 VIEW COMPARISON:  Chest radiograph 03/27/2019 FINDINGS: ET tube terminates in the distal trachea. Enteric tube courses inferior to the diaphragm. Interval removal right IJ central venous catheter. Left IJ dual lumen central venous catheter tip projects over the superior vena cava. Right upper extremity PICC line tip projects over the right atrium. Stable cardiomegaly. Persistent diffuse bilateral  heterogeneous pulmonary opacities, slightly increased within the left upper lung and right lung base. No pleural effusion or pneumothorax. IMPRESSION: Slight interval increase in opacities within the left upper lung and right lung base. Electronically Signed   By: Lovey Newcomer M.D.   On: 03/30/2019 13:27   Labs: BMET Recent Labs  Lab 03/28/19 0045  03/28/19 1535 03/29/19 0100  03/29/19 1845 03/30/19 0220 03/30/19 0451 03/30/19 1050 03/30/19 1515 04/27/2019 0150 04/05/2019 0620  NA 137   < > 136 137  137   < > 135 135  134* 136 136 138 135 137  K 4.7   < > 5.0 5.0  5.0   < > 5.0 5.0  5.0 5.0 4.8 4.2 4.2 4.4  CL 105  --  105 105  105  --  104 102  102  --   --  105 103  --   CO2 25  --  25 25  25   --  25 27  26   --   --  26 25  --   GLUCOSE 147*  --  132* 161*  163*  --  130* 155*  155*  --   --  49* 80  --   BUN 26*  --  26* 30*  29*  --  27* 26*  26*  --   --  24* 17  --   CREATININE 1.30*  --  1.29* 1.43*  1.42*  --  1.14 1.08  1.09  --   --  1.07 0.98  --  CALCIUM 6.8*  --  7.0* 7.0*  6.9*  --  7.1* 7.1*  7.1*  --   --  7.3* 7.0*  --   PHOS 2.2*  --  1.8* 2.7  2.7  --  2.5 2.4*  --   --  2.2* 2.8  --    < > = values in this interval not displayed.   CBC Recent Labs  Lab 03/26/19 0300  03/27/19 0140  03/28/19 0045  03/29/19 0100  03/29/19 2042 03/30/19 0220  03/30/19 1050 03/30/19 1420 04/02/2019 0620 04/14/2019 0832  WBC 21.0*  --  13.4*  --  12.4*  --  11.7*  --  11.8* 11.2*  --   --  11.7*  --  12.9*  NEUTROABS 19.6*  --  12.3*  --  11.1*  --  10.3*  --   --   --   --   --   --   --   --   HGB 7.8*   < > 8.8*   < > 7.5*   < > 7.5*   < > 8.9* 8.5*   < > 8.5* 7.9* 7.5* 7.1*  HCT 25.4*   < > 27.8*   < > 23.1*   < > 23.4*   < > 27.8* 26.3*   < > 25.0* 23.9* 22.0* 22.0*  MCV 93.7  --  92.7  --  92.0  --  91.8  --  92.4 91.6  --   --  93.4  --  95.2  PLT 74*  --  75*  --  83*  --  86*  --  82* 87*  --   --  81*  --  74*   < > = values in this interval not  displayed.    Medications:    . B-complex with vitamin C  1 tablet Per Tube Daily  . chlorhexidine  15 mL Mouth/Throat BID  . Chlorhexidine Gluconate Cloth  6 each Topical Q0600  . Chlorhexidine Gluconate Cloth  6 each Topical Daily  . heparin injection (subcutaneous)  5,000 Units Subcutaneous Q8H  . insulin aspart  0-15 Units Subcutaneous Q4H  . insulin detemir  5 Units Subcutaneous BID  . mouth rinse  15 mL Mouth Rinse 10 times per day  . midodrine  10 mg Per Tube Q8H  . [START ON 2019/04/10] pantoprazole sodium  40 mg Per Tube Daily  . pneumococcal 23 valent vaccine  0.5 mL Intramuscular Tomorrow-1000  . sodium chloride flush  10-40 mL Intracatheter Q12H  . Thrombi-Pad  1 each Topical Once   Elmarie Shiley, MD 04/26/2019, 12:26 PM

## 2019-03-31 NOTE — Progress Notes (Signed)
Time of Death 2053. Confirmed by two RN's.

## 2019-03-31 NOTE — Progress Notes (Signed)
SBP in the 60s maxed on Levo. Dr. Vaughan Browner notified, orders received for 500 mL NS bolus and phenylephrine drip.   Also notified MD that RASS is -5 on 0.5mg /hr Versed and 1mg /hr Dilaudid.

## 2019-03-31 NOTE — Progress Notes (Signed)
Pt's head moved to right side for proning. RT will monitor.

## 2019-03-31 NOTE — Progress Notes (Signed)
POC glucose levels were 35 at 1934, confirmed at 2nd site at [place]. Treated with hypoglycemia protocol, with recheck glucose coming up to normal limits. The 2 critically low results are not currently transferring to Epic.   PM long-acting insulin held d/t hypoglycemic event

## 2019-03-31 NOTE — Progress Notes (Signed)
Notified CCM MD that pt has no cough, gag, or corneal reflexes, hypotensive to the 60s maxed on 3 pressors. Per chart, pt is DNR with no escalation of care. Pt passed at 2053.

## 2019-03-31 NOTE — Progress Notes (Signed)
Spoke with patient's daughter, Matthias Hughs and gave update on patient's status. Daughter states she would like to video in today to see patient. Advised that I would speak with elink to get video visit set up.

## 2019-03-31 NOTE — Progress Notes (Signed)
On initial assessment, pupils noted to be 27mm and nonreactive bilaterally. No cough, gag, or corneal reflexes. MD notified.

## 2019-03-31 NOTE — Progress Notes (Signed)
[facility]  I called the patient's daughter and updated her to let them know that his condition has continued to decline over night.  I let them know that we will continue full medical support but not perform CPR in the event of a cardiac arrest, they voiced undertstanding.  Roselie Awkward, MD [facility] Pager: [phone] Cell: [phone] If no response, call [phone]

## 2019-03-31 NOTE — Progress Notes (Signed)
CRITICAL VALUE ALERT  Critical Value:  APTT greater than 200  Date & Time Notied:  03/30/2019@0358   Provider Notified: pending  Orders Received/Actions taken:pending

## 2019-03-31 NOTE — Progress Notes (Signed)
Increased oxygen and pressor requirements overnight. Fluid removed from pt minimal due to hypotension. Currently on 100% Fi02, 8 of PEEP, on Levo and Neo. Continuous sedation has been held d/t hypotension and RASS -5.

## 2019-03-31 NOTE — Progress Notes (Signed)
Attempted to prone patient, however, patient became further hypotensive with SBP 70's-80's, MAP 40s-50s. Attempted to adjust vasopressors, however patient had minimal response to increased doses. RT notified/came to bedside to help place patient back to supine position. BP improved after placing back supine. See vital sign flowsheet for more information.

## 2019-03-31 NOTE — Progress Notes (Signed)
NAMEEryck Owen, MRN:  [id number], DOB:  24-Feb-1951, LOS: 68 ADMISSION DATE:  03/05/2019, CONSULTATION DATE: Mar 21, 2019 REFERRING MD: Dr. Thereasa Solo, CHIEF COMPLAINT: Dyspnea  Brief History   68 year old male admitted on May 19 with COVID-19 pneumonia.  Developed acute kidney injury and worsening hypoxemic respiratory failure requiring intubation and mechanical ventilation on Mar 21, 2019.   Past Medical History  Chronic kidney disease Cigarette smoker Hypertension Diabetes mellitus type 2  Significant Hospital Events   May 27 initiation of CVVHD May 30 Hgb down, transfused 1 U PRBC May 31 Hgb down again, transfused 1PRBC again June 1 Oxygenation worse, placed in prone position, worsening shock  Consults:  Pulmonary and critical care medicine Nephrology  Procedures:  ET tube May 23>  Right IJ central line May 23> May 30 Left IJ hemodialysis catheter May 27>   Significant Diagnostic Tests:  May 25 CT head no acute intracranial process  Micro Data:  SARS CoV2 5/19 >> positive Urine 5/20 >> insignificant growth Blood 5/19 >> 1 of 2 MRSE (? Contaminant) Blood 5/20 >> Resp 5/23 >>Candida albicans  Antimicrobials:  Azithro 5/19 x 1 Ceftriaxone 5/19 x 1 Cefepime 5/20 >>off vanco 5/20 >> 5/22  Interim history/subjective:   Worsening shock overnight Was in prone position yesterday No evidence of bleeding CXR with worsening infiltrate  Objective   Blood pressure 110/65, pulse 89, temperature (!) 94.3 F (34.6 C), resp. rate (!) 26, height 5\' 1"  (1.549 m), weight 50.8 kg, SpO2 97 %.    Vent Mode: PCV FiO2 (%):  [50 %-80 %] 80 % Set Rate:  [32 bmp] 32 bmp PEEP:  [8 cmH20] 8 cmH20 Plateau Pressure:  [25 cmH20-30 cmH20] 30 cmH20   Intake/Output Summary (Last 24 hours) at 04/11/2019 0726 Last data filed at 04/12/2019 0700 Gross per 24 hour  Intake 2639.28 ml  Output 1244 ml  Net 1395.28 ml   Filed Weights   03/29/19 0500 03/30/19 0500 04/24/2019 0500  Weight:  47.2 kg 45.1 kg 50.8 kg    Examination:  General:  In bed on vent HENT: NCAT ETT in place PULM: CTA B, vent supported breathing CV: RRR, no mgr GI: BS+, soft, nontender MSK: normal bulk and tone Neuro: sedated on vent    June 1 chest x-ray images independently reviewed showing bibasilar infiltrates, likely new/worsening infiltrate left upper lobe.    Resolved Hospital Problem list     Assessment & Plan:  Acute respiratory failure with hypoxemia due to COVID 19: worsening, most likely consistent with COVID 19 but also possible HCAP Respiratory culture Start cefepime Continue full mechanical ventilatory support per ARDS protocol Resume prone positioning at 1:00 today Tidal volume 6 to 8 cc/kg ideal body weight Keep plateau pressure less than 30 degrees Target PaO2 to FiO2 ratio less than 150 Continue sedation target of RA SS score of -3 to -4 Continue hydromorphone and midazolam infusions  AKI  Continue CVVHD per renal  Autonomic instability Continue Levophed, vasopressin added today, neo-Synephrine titrated for M AP greater than 60. Continue Midrin  Need for mechanical ventilation, sedation for vent synchrony Periods of unresponsiveness Continue midazolam and hydromorphone infusions with RA SS target -3  Continued hemoglobin drop with shock, concern for either GI bleeding or retroperitoneal bleed Change PPI to daily F/u haptoglobin  Thrombocytopenia, agree likely related to famotidine, timecourse and drop doesn't fit well with HITT; most likely explanation is marrow suppression from severe critical illness.  Doubt DIC. Monitor for bleeding  Best practice:  Diet: tube feeding Pain/Anxiety/Delirium protocol (if indicated):  VAP protocol (if indicated):  DVT prophylaxis: heparin GI prophylaxis: PPI to bid Glucose control: per TRH Mobility: bed rest Code Status: Full Family Communication: I updated daughter at length on 5/31 by phone Disposition: remain in ICU   Labs   CBC: Recent Labs  Lab 03/25/19 0230  03/26/19 0300  03/27/19 0140  03/28/19 0045  03/29/19 0100  03/29/19 2042 03/30/19 0220 03/30/19 0451 03/30/19 1050 03/30/19 1420 2019-04-24 0620  WBC 15.5*  --  21.0*  --  13.4*  --  12.4*  --  11.7*  --  11.8* 11.2*  --   --  11.7*  --   NEUTROABS 14.7*  --  19.6*  --  12.3*  --  11.1*  --  10.3*  --   --   --   --   --   --   --   HGB 8.8*   < > 7.8*   < > 8.8*   < > 7.5*   < > 7.5*   < > 8.9* 8.5* 8.2* 8.5* 7.9* 7.5*  HCT 29.5*   < > 25.4*   < > 27.8*   < > 23.1*   < > 23.4*   < > 27.8* 26.3* 24.0* 25.0* 23.9* 22.0*  MCV 95.8  --  93.7  --  92.7  --  92.0  --  91.8  --  92.4 91.6  --   --  93.4  --   PLT 76*  --  74*  --  75*  --  83*  --  86*  --  82* 87*  --   --  81*  --    < > = values in this interval not displayed.    Basic Metabolic Panel: Recent Labs  Lab 03/27/19 0140  03/28/19 0045  03/29/19 0100  03/29/19 1845 03/30/19 0220 03/30/19 0451 03/30/19 1050 03/30/19 1515 24-Apr-2019 0150 April 24, 2019 0620  NA  --    < > 137   < > 137  137   < > 135 135  134* 136 136 138 135 137  K  --    < > 4.7   < > 5.0  5.0   < > 5.0 5.0  5.0 5.0 4.8 4.2 4.2 4.4  CL  --    < > 105   < > 105  105  --  104 102  102  --   --  105 103  --   CO2  --    < > 25   < > 25  25  --  25 27  26   --   --  26 25  --   GLUCOSE  --    < > 147*   < > 161*  163*  --  130* 155*  155*  --   --  49* 80  --   BUN  --    < > 26*   < > 30*  29*  --  27* 26*  26*  --   --  24* 17  --   CREATININE  --    < > 1.30*   < > 1.43*  1.42*  --  1.14 1.08  1.09  --   --  1.07 0.98  --   CALCIUM  --    < > 6.8*   < > 7.0*  6.9*  --  7.1* 7.1*  7.1*  --   --  7.3* 7.0*  --   MG 2.5*  --  2.4  --  2.6*  --   --  2.5*  --   --   --  2.6*  --   PHOS  --    < > 2.2*   < > 2.7  2.7  --  2.5 2.4*  --   --  2.2* 2.8  --    < > = values in this interval not displayed.   GFR: Estimated Creatinine Clearance: 51.8 mL/min (by C-G formula based on SCr of 0.98 mg/dL).  Recent Labs  Lab 03/29/19 0100 03/29/19 2042 03/30/19 0220 03/30/19 1420  WBC 11.7* 11.8* 11.2* 11.7*    Liver Function Tests: Recent Labs  Lab 03/27/19 0140  03/28/19 0045  03/29/19 0100 03/29/19 1845 03/30/19 0220 03/30/19 1515 Apr 19, 2019 0150  AST 55*  --  49*  --  59*  --  62*  --  64*  ALT 26  --  24  --  29  --  31  --  35  ALKPHOS 186*  --  159*  --  200*  --  204*  --  195*  BILITOT 0.4  --  0.1*  --  0.3  --  0.2*  --  0.4  PROT 6.1*  --  5.5*  --  5.6*  --  5.8*  --  5.8*  ALBUMIN 2.3*   < > 1.9*  1.9*   < > 2.0*  1.9* 2.1* 2.1*  2.1* 2.2* 2.1*  2.0*   < > = values in this interval not displayed.   No results for input(s): LIPASE, AMYLASE in the last 168 hours. No results for input(s): AMMONIA in the last 168 hours.  ABG    Component Value Date/Time   PHART 7.284 (L) Apr 19, 2019 0620   PCO2ART 46.8 April 19, 2019 0620   PO2ART 60.0 (L) Apr 19, 2019 0620   HCO3 22.6 19-Apr-2019 0620   TCO2 24 2019-04-19 0620   ACIDBASEDEF 4.0 (H) 04/19/19 0620   O2SAT 90.0 19-Apr-2019 0620     Coagulation Profile: No results for input(s): INR, PROTIME in the last 168 hours.  Cardiac Enzymes: No results for input(s): CKTOTAL, CKMB, CKMBINDEX, TROPONINI in the last 168 hours.  HbA1C: Hgb A1c MFr Bld  Date/Time Value Ref Range Status  03/18/2019 10:45 AM 14.9 (H) 4.8 - 5.6 % Final    Comment:    (NOTE) Pre diabetes:          5.7%-6.4% Diabetes:              >6.4% Glycemic control for   <7.0% adults with diabetes   03/18/2019 01:18 AM 14.9 (H) 4.8 - 5.6 % Final    Comment:    (NOTE) Pre diabetes:          5.7%-6.4% Diabetes:              >6.4% Glycemic control for   <7.0% adults with diabetes     CBG: Recent Labs  Lab 03/30/19 2015 04-19-2019 0009 2019-04-19 0033 2019/04/19 0413 04-19-19 0435  GLUCAP 129* 59* 107* 55* 101*      Critical care time: 35 minutes      Roselie Awkward, MD [facility] Pager: [phone] Cell: [phone] If no response, call  [phone]

## 2019-04-01 ENCOUNTER — Other Ambulatory Visit: Payer: Self-pay

## 2019-04-03 LAB — CULTURE, RESPIRATORY W GRAM STAIN

## 2019-04-29 NOTE — Death Summary Note (Addendum)
Death Summary  Brian Owen KNL:[id number] DOB: Feb 13, 1951 DOA: Mar 26, 2019  PCP: Alfonse Spruce, FNP PCP/Office notified:   Admit date: Mar 26, 2019 Date of Death: 04/09/2019  Final Diagnoses:  Principal Problem:   Acute respiratory disease due to COVID-19 virus Active Problems:   Type 2 diabetes mellitus with hyperglycemia, with long-term current use of insulin ([place])   AKI (acute kidney injury) ([place])   Severe sepsis ([place])   Acute respiratory failure with hypoxia ([facility])   Pressure injury of skin   Acute Hypoxic Respiratory Failure due to Covid-19 Viral Illness/ARDS/shock -full vent support, worsening overnight   Vent Mode: PCV FiO2 (%):  [50 %-80 %] 80 % Set Rate:  [32 bmp] 32 bmp PEEP:  [8 cmH20] 8 cmH20 Plateau Pressure:  [25 cmH20-30 cmH20] 30 cmH20  -Not a candidate for Remdesivir due to renal failure -Has received Actemra x2 -Has received Solu-Medrol x3 days up until 5/23 -hypotensive overnight, requiring a second pressor. Also hypoglycemic and hypothermic -cortisol appropriate at 17, TSH normal -Chest x-ray yesterday with slight interval increase in opacities,?  Chest x-ray findings as well as hypotension, hypothermia related to developing infection, will place on broad-spectrum antibiotics with Vanco cefepime for H CAP coverage.  Sputum cultures obtained  COVID-19 Labs  RecentLabs(last2labs)  Recent Labs    03/29/19 0100 03/29/19 1845 09-Apr-2019 0150  DDIMER 4.99*  --   --   FERRITIN 122  --   --   LDH  --  420* 382*  CRP 14.4*  --  10.5*      RecentLabs       Lab Results  Component Value Date   SARSCOV2NAA POSITIVE (A) 2019/03/26       Active Problems: Anemia (question acute blood loss) -Patient's hemoglobin has been around 7.5-8.8 since admission, but dropped to 6.5 on 5/30. Received a total of 2U pRBC with adequate improvement however now trending down again. No obvious bleeding.  -Fecal occult pending, haptoglobin still pending.   Possible DIC given elevated APTT however fibrinogen elevated, but difficult to interpret in the setting of COVID-19.  Smear review for schistocytes pending -Continue to monitor and transfuse for hemoglobin less than 7  Thrombocytopenia  -less likely HIT given slow decline  Autonomic dysfunction /chronic orthostatic hypotension -Cont midodrine per tube, on pressors  Acute kidney injury on chronic kidney disease stage III -Baseline creatinine around 2.0, urine output was minimal, did not respond to Lasix and patient was started on CRRT 5/28 -Nephrology following  Type 2 diabetes mellitus  -decrease Lantus due to hypoglycemia  Mildly elevated troponin -Due to demand  Positive blood culture - Coag neg Staph + Diptheroids (1 of 2 blood cx) -Consistent with contamination  Patient remains critically ill and progressively declining despite full medical support.  PCCM to discuss with family regarding DNR which is appropriate in this case      History of present illness:  68 year old AM PMHx  hypertension, diabetes, chronic kidney disease stage III, multiple prior admissions for orthostatic hypotension related to autonomic dysfunction-noncompliant with Midodrine, who was admitted to the hospital on 2019/03/26 with fever, worsening renal failure, as well as multifocal pneumonia on chest x-ray.  He was found to be Covid positive  Hospital Course:  Despite maximal treatment for his COVID 19 pneumonia patient continued to decline and expired on 04-09-2019 from acute respiratory failure secondary to COVID-19 infection. Notified his daughter Brian, Owen, and his son-in-law.   Time: 2053  Signed:  Dia Crawford, MD [facility] [phone]

## 2019-04-29 DEATH — deceased
